# Patient Record
Sex: Male | Born: 1935
Health system: Southern US, Community
[De-identification: ages and names within clinical notes are randomized; demographics above are authoritative.]

## PROBLEM LIST (undated history)

## (undated) DIAGNOSIS — R011 Cardiac murmur, unspecified: Secondary | ICD-10-CM

## (undated) DIAGNOSIS — M47816 Spondylosis without myelopathy or radiculopathy, lumbar region: Secondary | ICD-10-CM

## (undated) DIAGNOSIS — R0600 Dyspnea, unspecified: Secondary | ICD-10-CM

## (undated) DIAGNOSIS — E785 Hyperlipidemia, unspecified: Secondary | ICD-10-CM

## (undated) DIAGNOSIS — R972 Elevated prostate specific antigen [PSA]: Secondary | ICD-10-CM

## (undated) DIAGNOSIS — Z22321 Carrier or suspected carrier of Methicillin susceptible Staphylococcus aureus: Secondary | ICD-10-CM

## (undated) DIAGNOSIS — C44300 Unspecified malignant neoplasm of skin of unspecified part of face: Secondary | ICD-10-CM

## (undated) DIAGNOSIS — E46 Unspecified protein-calorie malnutrition: Secondary | ICD-10-CM

## (undated) DIAGNOSIS — H409 Unspecified glaucoma: Secondary | ICD-10-CM

## (undated) DIAGNOSIS — Z9289 Personal history of other medical treatment: Secondary | ICD-10-CM

## (undated) DIAGNOSIS — I10 Essential (primary) hypertension: Secondary | ICD-10-CM

## (undated) DIAGNOSIS — M4856XA Collapsed vertebra, not elsewhere classified, lumbar region, initial encounter for fracture: Secondary | ICD-10-CM

## (undated) DIAGNOSIS — K219 Gastro-esophageal reflux disease without esophagitis: Secondary | ICD-10-CM

## (undated) DIAGNOSIS — I2510 Atherosclerotic heart disease of native coronary artery without angina pectoris: Secondary | ICD-10-CM

## (undated) DIAGNOSIS — D759 Disease of blood and blood-forming organs, unspecified: Secondary | ICD-10-CM

## (undated) DIAGNOSIS — I350 Nonrheumatic aortic (valve) stenosis: Secondary | ICD-10-CM

## (undated) DIAGNOSIS — C9140 Hairy cell leukemia not having achieved remission: Secondary | ICD-10-CM

## (undated) DIAGNOSIS — C189 Malignant neoplasm of colon, unspecified: Secondary | ICD-10-CM

## (undated) DIAGNOSIS — Z8719 Personal history of other diseases of the digestive system: Secondary | ICD-10-CM

## (undated) DIAGNOSIS — M810 Age-related osteoporosis without current pathological fracture: Secondary | ICD-10-CM

## (undated) DIAGNOSIS — K3580 Unspecified acute appendicitis: Secondary | ICD-10-CM

## (undated) DIAGNOSIS — F419 Anxiety disorder, unspecified: Secondary | ICD-10-CM

## (undated) DIAGNOSIS — D649 Anemia, unspecified: Secondary | ICD-10-CM

## (undated) DIAGNOSIS — J189 Pneumonia, unspecified organism: Secondary | ICD-10-CM

## (undated) DIAGNOSIS — M1990 Unspecified osteoarthritis, unspecified site: Secondary | ICD-10-CM

## (undated) HISTORY — DX: Unspecified acute appendicitis: K35.80

## (undated) HISTORY — DX: Hairy cell leukemia not having achieved remission: C91.40

## (undated) HISTORY — DX: Unspecified osteoarthritis, unspecified site: M19.90

## (undated) HISTORY — DX: Spondylosis without myelopathy or radiculopathy, lumbar region: M47.816

## (undated) HISTORY — DX: Hyperlipidemia, unspecified: E78.5

## (undated) HISTORY — DX: Age-related osteoporosis without current pathological fracture: M81.0

## (undated) HISTORY — DX: Unspecified protein-calorie malnutrition: E46

## (undated) HISTORY — DX: Anemia, unspecified: D64.9

## (undated) HISTORY — DX: Malignant neoplasm of colon, unspecified: C18.9

## (undated) HISTORY — DX: Elevated prostate specific antigen (PSA): R97.20

## (undated) HISTORY — DX: Essential (primary) hypertension: I10

## (undated) HISTORY — DX: Unspecified glaucoma: H40.9

## (undated) HISTORY — PX: FRACTURE SURGERY: SHX138

## (undated) HISTORY — PX: EYE SURGERY: SHX253

## (undated) HISTORY — DX: Nonrheumatic aortic (valve) stenosis: I35.0

## (undated) HISTORY — DX: Gastro-esophageal reflux disease without esophagitis: K21.9

## (undated) HISTORY — DX: Collapsed vertebra, not elsewhere classified, lumbar region, initial encounter for fracture: M48.56XA

## (undated) HISTORY — DX: Carrier or suspected carrier of methicillin susceptible Staphylococcus aureus: Z22.321

## (undated) HISTORY — DX: Atherosclerotic heart disease of native coronary artery without angina pectoris: I25.10

## (undated) HISTORY — PX: MOHS SURGERY: SUR867

---
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
## 1966-03-03 DIAGNOSIS — Z8711 Personal history of peptic ulcer disease: Secondary | ICD-10-CM

## 1966-03-03 DIAGNOSIS — Z8719 Personal history of other diseases of the digestive system: Secondary | ICD-10-CM

## 1966-03-03 HISTORY — DX: Personal history of other diseases of the digestive system: Z87.19

## 1966-03-03 HISTORY — DX: Personal history of peptic ulcer disease: Z87.11

## 1982-03-03 DIAGNOSIS — C189 Malignant neoplasm of colon, unspecified: Secondary | ICD-10-CM

## 1982-03-03 HISTORY — PX: COLON SURGERY: SHX602

## 1982-03-03 HISTORY — DX: Malignant neoplasm of colon, unspecified: C18.9

## 1983-03-04 HISTORY — PX: MOLE REMOVAL: SHX2046

## 1990-03-03 HISTORY — PX: SPLENECTOMY: SUR1306

## 1998-03-03 HISTORY — PX: ORIF ANKLE FRACTURE: SUR919

## 1998-07-19 ENCOUNTER — Encounter: Payer: Self-pay | Admitting: Specialist

## 1998-07-19 ENCOUNTER — Ambulatory Visit (HOSPITAL_COMMUNITY): Admission: RE | Admit: 1998-07-19 | Discharge: 1998-07-21 | Payer: Self-pay | Admitting: Specialist

## 1998-09-20 ENCOUNTER — Ambulatory Visit (HOSPITAL_COMMUNITY): Admission: RE | Admit: 1998-09-20 | Discharge: 1998-09-20 | Payer: Self-pay | Admitting: Specialist

## 2001-01-01 ENCOUNTER — Encounter: Payer: Self-pay | Admitting: Ophthalmology

## 2001-01-05 ENCOUNTER — Ambulatory Visit (HOSPITAL_COMMUNITY): Admission: RE | Admit: 2001-01-05 | Discharge: 2001-01-05 | Payer: Self-pay | Admitting: Ophthalmology

## 2004-03-25 ENCOUNTER — Ambulatory Visit: Payer: Self-pay | Admitting: Oncology

## 2004-04-03 HISTORY — PX: SHOULDER SURGERY: SHX246

## 2005-04-11 ENCOUNTER — Ambulatory Visit: Payer: Self-pay | Admitting: Oncology

## 2006-02-24 ENCOUNTER — Emergency Department (HOSPITAL_COMMUNITY): Admission: EM | Admit: 2006-02-24 | Discharge: 2006-02-24 | Payer: Self-pay | Admitting: Emergency Medicine

## 2006-04-08 ENCOUNTER — Ambulatory Visit: Payer: Self-pay | Admitting: Oncology

## 2006-10-07 ENCOUNTER — Encounter: Payer: Self-pay | Admitting: Internal Medicine

## 2006-10-07 ENCOUNTER — Encounter: Admission: RE | Admit: 2006-10-07 | Discharge: 2006-10-07 | Payer: Self-pay | Admitting: Internal Medicine

## 2006-10-09 ENCOUNTER — Encounter: Payer: Self-pay | Admitting: Internal Medicine

## 2006-10-09 ENCOUNTER — Inpatient Hospital Stay (HOSPITAL_COMMUNITY): Admission: AD | Admit: 2006-10-09 | Discharge: 2006-10-23 | Payer: Self-pay | Admitting: Internal Medicine

## 2006-10-13 ENCOUNTER — Ambulatory Visit: Payer: Self-pay | Admitting: Infectious Diseases

## 2006-10-13 ENCOUNTER — Encounter (HOSPITAL_BASED_OUTPATIENT_CLINIC_OR_DEPARTMENT_OTHER): Payer: Self-pay | Admitting: Internal Medicine

## 2006-10-19 ENCOUNTER — Encounter: Payer: Self-pay | Admitting: Internal Medicine

## 2006-11-16 ENCOUNTER — Encounter: Admission: RE | Admit: 2006-11-16 | Discharge: 2006-11-16 | Payer: Self-pay | Admitting: Neurosurgery

## 2006-11-19 ENCOUNTER — Encounter: Payer: Self-pay | Admitting: Internal Medicine

## 2006-11-19 ENCOUNTER — Telehealth: Payer: Self-pay | Admitting: Internal Medicine

## 2006-11-20 ENCOUNTER — Encounter: Payer: Self-pay | Admitting: Internal Medicine

## 2006-11-30 ENCOUNTER — Ambulatory Visit: Payer: Self-pay | Admitting: Internal Medicine

## 2006-11-30 DIAGNOSIS — A4901 Methicillin susceptible Staphylococcus aureus infection, unspecified site: Secondary | ICD-10-CM | POA: Insufficient documentation

## 2006-12-07 ENCOUNTER — Encounter: Admission: RE | Admit: 2006-12-07 | Discharge: 2006-12-07 | Payer: Self-pay | Admitting: Neurosurgery

## 2006-12-10 ENCOUNTER — Encounter: Payer: Self-pay | Admitting: Internal Medicine

## 2006-12-15 ENCOUNTER — Encounter: Payer: Self-pay | Admitting: Internal Medicine

## 2006-12-22 ENCOUNTER — Encounter: Payer: Self-pay | Admitting: Internal Medicine

## 2007-01-07 ENCOUNTER — Ambulatory Visit: Payer: Self-pay | Admitting: Internal Medicine

## 2007-01-07 LAB — CONVERTED CEMR LAB
HCT: 38.3 % — ABNORMAL LOW (ref 39.0–52.0)
Hemoglobin: 12.1 g/dL — ABNORMAL LOW (ref 13.0–17.0)
MCHC: 31.6 g/dL (ref 30.0–36.0)
MCV: 87.6 fL (ref 78.0–100.0)
Platelets: 225 10*3/uL (ref 150–400)
RBC: 4.37 M/uL (ref 4.22–5.81)
RDW: 16.4 % — ABNORMAL HIGH (ref 11.5–14.0)
Sed Rate: 12 mm/hr (ref 0–16)
WBC: 5.8 10*3/uL (ref 4.0–10.5)

## 2007-01-11 ENCOUNTER — Encounter: Payer: Self-pay | Admitting: Internal Medicine

## 2007-01-27 ENCOUNTER — Telehealth: Payer: Self-pay | Admitting: Internal Medicine

## 2007-02-02 ENCOUNTER — Encounter: Payer: Self-pay | Admitting: Internal Medicine

## 2007-02-03 ENCOUNTER — Telehealth: Payer: Self-pay | Admitting: Internal Medicine

## 2007-02-09 ENCOUNTER — Encounter: Admission: RE | Admit: 2007-02-09 | Discharge: 2007-02-09 | Payer: Self-pay | Admitting: Neurosurgery

## 2007-02-16 ENCOUNTER — Encounter: Payer: Self-pay | Admitting: Internal Medicine

## 2007-03-10 ENCOUNTER — Telehealth: Payer: Self-pay | Admitting: Internal Medicine

## 2007-03-25 ENCOUNTER — Encounter: Admission: RE | Admit: 2007-03-25 | Discharge: 2007-03-25 | Payer: Self-pay | Admitting: Neurosurgery

## 2007-03-29 ENCOUNTER — Encounter: Admission: RE | Admit: 2007-03-29 | Discharge: 2007-03-29 | Payer: Self-pay | Admitting: Neurosurgery

## 2007-04-01 ENCOUNTER — Encounter: Payer: Self-pay | Admitting: Internal Medicine

## 2007-06-08 ENCOUNTER — Encounter: Payer: Self-pay | Admitting: Internal Medicine

## 2007-07-21 ENCOUNTER — Encounter: Admission: RE | Admit: 2007-07-21 | Discharge: 2007-07-21 | Payer: Self-pay | Admitting: Neurosurgery

## 2007-07-27 ENCOUNTER — Encounter: Payer: Self-pay | Admitting: Internal Medicine

## 2007-10-02 HISTORY — PX: APPENDECTOMY: SHX54

## 2007-10-26 ENCOUNTER — Inpatient Hospital Stay (HOSPITAL_COMMUNITY): Admission: AD | Admit: 2007-10-26 | Discharge: 2007-11-02 | Payer: Self-pay | Admitting: Internal Medicine

## 2007-10-27 ENCOUNTER — Encounter (INDEPENDENT_AMBULATORY_CARE_PROVIDER_SITE_OTHER): Payer: Self-pay | Admitting: General Surgery

## 2008-02-01 HISTORY — PX: CATARACT EXTRACTION, BILATERAL: SHX1313

## 2009-03-30 ENCOUNTER — Encounter: Admission: RE | Admit: 2009-03-30 | Discharge: 2009-03-30 | Payer: Self-pay | Admitting: Neurosurgery

## 2009-04-10 ENCOUNTER — Encounter: Payer: Self-pay | Admitting: Internal Medicine

## 2010-04-02 NOTE — Assessment & Plan Note (Signed)
Summary: hsfu [facility] need chrt   Vital Signs:  Patient Profile:   77 Years Old Male Weight:      177.44 pounds Temp:     97.6 degrees F oral BP sitting:   115 / 59             Is Patient Diabetic? No  Does patient need assistance? Functional Status Self care Ambulation Normal     Visit Type:  Follow-up   History of Present Illness: Mr. Weed is in for his hospital follow-up visit.  My partners and I saw him when he was hospitalized in August with methicillin sensitive Staph aureus infection of his right wrist and lumbar spine.  He has been receiving IV Ancef via a left arm PICC at home.  He is doing much better.  He recalls that his pain was 150 on a one to 10 scale when first hospitalized.  He now has no pain and only minimal lower back aching.  He is only taking one to two Tylenol at bedtime for pain.  He has had no problems with his Ancef or PICC.      Risk Factors:  Tobacco use:  never    Physical Exam  General:     alert.  comfortable Extremities:     he has a stocking glove on his right hand and wrist.  His left arm PICC site looks normal. Neurologic:     gait normal.      Impression & Recommendations:  Problem # 1:  INFECTION, STAPHYLOCOCCUS AUREUS (ICD-041.11) He is much improved after 7+ weeks of IV antibiotic therapy.  His pain is nearly resolved and his sed rate has improved from 54 while hospitalized to 20.  I will stop Ancef and have the PICC removed after 8 full weeks of therapy and switched to oral Keflex for one more month. Orders: Est. Patient Level III (16109)    Patient Instructions: 1)  Please schedule a follow-up appointment in 4-6 weeks    ]

## 2010-04-02 NOTE — Consult Note (Signed)
Summary: [facility]:Dr. Theod[facility]:Dr. Newell Coral   Imported By: Florinda Marker 07/14/2007 15:22:51  _____________________________________________________________________  External Attachment:    Type:   Image     Comment:   External Document

## 2010-04-02 NOTE — Consult Note (Signed)
Summary: [facility]:Dr. Vivianne [facility]:Dr. Orvan Falconer   Imported By: Florinda Marker 08/30/2007 14:04:37  _____________________________________________________________________  External Attachment:    Type:   Image     Comment:   External Document

## 2010-04-02 NOTE — Consult Note (Signed)
Summary: [facility]  [facility]   Imported By: Randon Goldsmith 04/16/2007 15:13:26  _____________________________________________________________________  External Attachment:    Type:   Image     Comment:   External Document

## 2010-04-02 NOTE — Assessment & Plan Note (Signed)
Summary: FU OV/VS   Chief Complaint:  f/u ov.  History of Present Illness: [name] is in for his routine visit. He has now completed about 10 weeks of antibiotic therapy, including Keflex for the last month for his methicillin sensitive Staph aureus bacteremia, lumbar infection, and right wrist infection. he continues to have problems with a weak grip and stiffness in his right hand and wrist.  However, he feels like he is making progress with his physical therapy.  He has no further back pain other than his usual, occasional, low back discomfort. He has not had any trouble tolerating his Keflex.      Risk Factors: Tobacco use:  never    Vital Signs:  Patient Profile:   77 Years Old Male Height:     66 inches Weight:      177.6 pounds BMI:     28.77 Temp:     97.4 degrees F oral Pulse rate:   78 / minute BP sitting:   124 / 68  (right arm)  Pt. in pain?   yes    Location:   r hand    Intensity:   1    Type:       aching  Vitals Entered By: Tomasita Morrow RN (January 07, 2007 9:52 AM)              Is Patient Diabetic? No Nutritional Status BMI of 25 - 29 = overweight Nutritional Status Detail nl  Have you ever been in a relationship where you felt threatened, hurt or afraid?No  Domestic Violence Intervention none  Does patient need assistance? Functional Status Self care Ambulation Normal     Physical Exam  General:     alert, smiling and comfortable.   Heart:     normal rate, regular rhythm, and no murmur.   Extremities:     some persistent swelling around the right wrist and the dorsum of his right hand.  Otherwise, there is no evidence of active infection. Neurologic:     gait normal.      Impression & Recommendations:  Problem # 1:  INFECTION, STAPHYLOCOCCUS AUREUS (ICD-041.11)  There is a very good chance that [name] infection is now cured.  However, I've told him that the only real test of cure is to eventually stop antibiotics and make sure that he has no recurrence.  Given the severity of his recent infection he would like to continue the Keflex for a little bit longer.  I think that is quite reasonable since he is tolerating it.  I will repeat his CBC and sed rate today. Orders: Est. Patient Level III (16109) T-CBC No Diff ([id number]) T-Sed Rate (Automated) ([id number])   Medications Added to Medication List This Visit: 1)  Keflex 500 Mg Caps (Cephalexin) .... Take 1 capsule by mouth three times a day   Patient Instructions: 1)  Please schedule a follow-up appointment in 1 month.    ]

## 2010-04-02 NOTE — Letter (Signed)
Summary: Discharge Summary  Discharge Summary   Imported By: Randon Goldsmith 12/02/2006 08:20:10  _____________________________________________________________________  External Attachment:    Type:   Image     Comment:   External Document

## 2010-04-02 NOTE — Progress Notes (Signed)
Summary: Labs faxed to Dr. Kennieth Rad office  Phone Note Call from Patient Call back at Home Phone [phone]   Caller: Spouse Reason for Call: Talk to Nurse, Lab or Test Results Action Taken: Phone Call Completed Summary of Call: Request to have most recent labs faxed to Dr. Kennieth Rad office.  Results faxed.  Wife called to let her know these had been faxed. Initial call taken by: Jennet Maduro RN,  January 27, 2007 5:34 PM

## 2010-04-02 NOTE — Consult Note (Signed)
Summary: [facility]  [facility]   Imported By: Florinda Marker 05/03/2009 14:12:38  _____________________________________________________________________  External Attachment:    Type:   Image     Comment:   External Document

## 2010-04-02 NOTE — Progress Notes (Signed)
  Phone Note Outgoing Call   Call placed by: Cliffton Asters MD,  February 03, 2007 2:58 PM Summary of Call: [name] make his appointment on 12/23 to discuss stopping Keflex. I suggested that he call me after his MRI on 12/16 and we can discuss options over the phone.

## 2010-04-02 NOTE — Miscellaneous (Signed)
Summary: [facility]   Imported By: Florinda Marker 01/14/2007 16:14:20  _____________________________________________________________________  External Attachment:    Type:   Image     Comment:   External Document

## 2010-04-02 NOTE — Consult Note (Signed)
Summary: [facility]  [facility]   Imported By: Randon Goldsmith 04/26/2007 13:19:04  _____________________________________________________________________  External Attachment:    Type:   Image     Comment:   External Document

## 2010-04-02 NOTE — Letter (Signed)
Summary: [facility]  [facility]   Imported By: Randon Goldsmith 02/24/2007 10:43:59  _____________________________________________________________________  External Attachment:    Type:   Image     Comment:   External Document

## 2010-04-02 NOTE — Consult Note (Signed)
Summary: [facility]  [facility]   Imported By: Randon Goldsmith 11/25/2006 08:45:00  _____________________________________________________________________  External Attachment:    Type:   Image     Comment:   External Document

## 2010-04-02 NOTE — Miscellaneous (Signed)
Summary: [facility]  [facility]   Imported By: Florinda Marker 12/29/2006 16:26:28  _____________________________________________________________________  External Attachment:    Type:   Image     Comment:   External Document

## 2010-04-02 NOTE — Miscellaneous (Signed)
Summary: [facility]  [facility]   Imported By: Randon Goldsmith 03/26/2007 11:42:40  _____________________________________________________________________  External Attachment:    Type:   Image     Comment:   External Document

## 2010-04-02 NOTE — Progress Notes (Signed)
Summary: pt. seen by Dr. Newell Coral, MD please call  Phone Note From Other Clinic   Caller: Dr. Newell Coral Call For: Dr. Cliffton Asters Details for Reason: Return his phone call Summary of Call: Pt. doing well on current IV antibiotics.  Dr. Ezzard Standing believes the pt. should stay on this treatment until the middle of October.  Pt's wrist septic arthritis and lumbar osteomylitis is improving.  Would appreciate a call from Dr. Orvan Falconer the week of September 22nd.  Office # [phone]  Initial call taken by: Jennet Maduro RN,  November 19, 2006 4:18 PM  Follow-up for Phone Call        [name] is feeling "like a million bucks". He is off pain meds. I will continue IV Ancef at least until his visit on 9/29. Follow-up by: Cliffton Asters MD,  November 23, 2006 5:00 PM

## 2010-04-02 NOTE — Progress Notes (Signed)
  Phone Note Outgoing Call   Call placed by: Cliffton Asters MD,  March 10, 2007 4:57 PM Reason for Call: Discuss lab or test results Details for Reason: review MRI results Summary of Call: I reviewed the MRI done on 12/08. There was a stable appearance of the lumbar spine without any definite signs of active infection. He finished his keflex in mid-December and is feeling better. He will stay off antibiotics and call me a fter his next MRI later this month.

## 2010-04-16 DIAGNOSIS — H43819 Vitreous degeneration, unspecified eye: Secondary | ICD-10-CM | POA: Insufficient documentation

## 2010-04-16 DIAGNOSIS — H35379 Puckering of macula, unspecified eye: Secondary | ICD-10-CM | POA: Insufficient documentation

## 2010-04-16 DIAGNOSIS — H353290 Exudative age-related macular degeneration, unspecified eye, stage unspecified: Secondary | ICD-10-CM | POA: Insufficient documentation

## 2010-07-16 NOTE — Discharge Summary (Signed)
NAME:  [name], [name]                 ACCOUNT NO.:  [id number]   MEDICAL RECORD NO.:  [id number]          PATIENT TYPE:  INP   LOCATION:  6741                         FACILITY:  [facility]   PHYSICIAN:  Barry Dienes. Eloise Harman, M.D.DATE OF BIRTH:  1923/06/25   DATE OF ADMISSION:  10/09/2006  DATE OF DISCHARGE:  10/23/2006                         DISCHARGE SUMMARY - REFERRING   CHIEF COMPLAINT:  Persistent pain and swelling of the right hand  associated with fever and back pain.   HISTORY OF PRESENT ILLNESS:  The patient is an 77 year old white male  who approximately 8 days prior to admission began to develop pain and  slight redness and edema of the right hand. He was seen at an outside  hospital on October 02, 2006 and felt to possibly have gout, so he was  started on Indocin 25 mg t.i.d. and Percocet as needed. He was seen in  our office on October 05, 2006 and it was noted that his right wrist and  hand were erythematous and tender without fluid accumulation, so  Colchicine 0.6 mg twice daily was started and a serum uric acid level  was obtained. On October 07, 2006, he represented to our office due to  persistent pain and edema in the right hand. At that time, he denied  fever or chills. His previous labs were significant for uric acid level  of 4.8. He had increased erythema of the right hand and right wrist with  worsening pain with dorsiflexion but no fluctuance, so further labs were  obtained including a CBC and erythrocyte sedimentation rate as well as x-  ray's of the hand, after which prednisone was to be started. Despite  these measures, his right hand pain and edema worsened over the next 2  days. He developed fever and chills with a temperature greater than 101  and very mild shortness of breath without productive cough, so he was  again seen in our office. Of note, he had been boating 1 day prior to  the onset of his right hand pain, but he was not fishing and had no skin  tears or  injuries while boating. At the time of hospital admission, he  also complained of his usual chronic low back pain without leg  radiation.   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 He also has a history of  sigmoid colon adenocarcinoma that was [facility] B-1 stage. He has a history  of situational depression, erectile dysfunction, dyspnea on exertion  with a May 2007 Cardiolite exercise test showing normal left ventricular  systolic function and no evidence of ischemia. He has a vague history of  coronary artery disease and a left lung nodule that has been evaluated  with bronchoscopy and serial chest CT scans at [facility]. He has a  history of elevated PSA levels with normal free PSA levels. Lastly, he  has a history of hypertension and bilateral hearing loss and  hyperlipidemia, for which he has declined  medication.   ADMISSION MEDICATIONS:  Preserve-vision twice daily, Centrum Silver once  daily, Zoloft 100 mg daily, aspirin 81 mg daily, Toprol XL 50 mg take  1/2 tab daily, Prilosec 20 mg daily, prednisone 20 mg daily for the past  2 days, Lisinopril 10 mg daily.   ALLERGIES:  SULFA DRUGS, some ANESTHETICS but not Lidocaine.   PHYSICAL EXAMINATION:  VITAL SIGNS:  Blood pressure 130/84, pulse 88,  respiratory rate 24, temperature 99.1. Pulse oxygen saturation 94% on  room air.  GENERAL:  He is a fatigued appearing white male with a flush face, who  had very mild tachypnea.  HEENT:  Examination significant for very dry skin and mild erythema in  the left external auditory canal.  NECK:  Supple. Without jugular venous distention. No carotid bruit.  CHEST:  Clear to auscultation.  HEART:  Regular rate and rhythm. S1 and S2 were present without murmur,  gallop, or rub.  ABDOMEN:  Normal bowel sounds and no hepatosplenomegaly or tenderness.   EXTREMITIES:  Without edema and the pedal pulses were normal. There is  diffuse erythema, edema, and fluctuance of the dorsum of the right hand  and wrist. This area is very tender to palpation. Dorsiflexion of the  right wrist is very painful.  NEUROLOGIC:  Alert and well oriented. He has moderate bilateral hearing  loss. He was able to change from a sitting position to a standing  position with only standby assistance and able to walk a few feet  independently.   LABORATORY DATA:  October 05, 2006, BUN 29, creatinine 0.9, uric acid 4.8.   October 07, 2006, white blood cell count 16.7 with 86% granulocytes.  Hemoglobin 12.6 and hematocrit 37.2. Platelets 236,000. Erythrocyte  sedimentation rate 100.   October 07, 2006, right wrist x-ray showed osteoarthritis as well as right  hand x-ray's. There were no signs of osteomyelitis.   Initial hospital labs, white blood cell count 19.0 with hemoglobin 11  and hematocrit 32. Platelets 235,000. Erythrocyte sedimentation rate 58,  serum sodium 135, potassium 4.1, chloride 105, carbon dioxide 23, BUN  25, creatinine 0.98, glucose 203.   HOSPITAL COURSE:  The patient was initially started on broad spectrum  antibiotics including Doxycycline, Rocephin, and Vancomycin, as well as  IV fluids. He was seen on the day of admission by a hand surgeon who  felt that he had septic arthritis and brought him to the operating room  for right wrist arthrotomy and drainage of infectious arthritis. He  noted that there was gross purulence within the right wrist and mid  carpal joint. 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 Cultures subsequently grew  out methicillin sensitive Staphylococcus aureus. He had re-evaluation  and incision and drainage on October 11, 2006 where he again noted gross  purulence within the wrist joint, more so within the scaphotrapezial  joint and SET joints. The patient did  well with the debridement and  irrigation. He had a third arthrotomy and drainage and debridement on  October 15, 2006, with a small amount of purulence in the mid carpal  joint and subcutaneous tissues was noted. Otherwise, the wound looked  much better. On October 14, 2006, he developed moderate low back pain and  had a MRI scan of the lumbar spine and pelvis done, which showed  extensive epidural fluid collection beginning at the L2-3 level and  extending down to the S2 level. This had a multi-locular appearance,  which  was displacing the dura and multiple nerve roots. The loculation  in the right sacral canal compressed the thecal sac and the S1 nerve  root. There was some edema and enhancement in the area of the right L5-  S1 facet joint, suggesting septic arthritis. There was also extensive  disk degeneration of the lumbar spine and extensive disk space narrowing  and disk edema at L3-4, L4-5, and L5-S1. Fatty endplate changes were  quite pronounced at those levels as well. There was no vertebral body  collapse seen. At the L1-2 level, there was a moderately large right  side disk protrusion, compression of the right side of the thecal sac  and compression of the right lateral recess. The pelvis MRI showed no  discrete findings for osteomyelitis. There was myositis and myofascitis  involving the surrounding hip and pelvic musculature, particularly on  the right, with no discrete drainable soft tissue abscess.   The patient was seen subsequently by a neurosurgeon and Infectious  Disease was consulted. It was felt that a trial of antibiotics with  close followup was warranted, rather than proceeding to extensive spine  surgery. His antibiotics were switched to Ancef in high doses and  Rifampin. Gradually, the patient's back pain and right wrist and hand  pain improved.   He had a followup lumbar spine MRI scan with contrast on October 21, 2006  that showed a multi-locular epidural  abscess from L3-4 through the  sacrum, similar to the previous study. The wall of the abscess was  slightly thicker but there was no significant progression in size of the  abscess. The patient had several other procedures including a  transthoracic echocardiogram that showed normal left ventricular size  and systolic function with mild aortic valve sclerosis without  significant stenosis. There was mild mitral valvular regurgitation and  mild to moderate tricuspid regurgitation. No vegetations were seen. The  patient also had a basilic vein PICC line placed at the bedside with no  complications.   PROCEDURES:  1. Incision and drainage of right wrist and hand abscess on October 10, 2006, October 11, 2006, and October 15, 2006.  2. MRI scan of the lumbar spine and pelvis on October 14, 2006.  3. MRI scan of the lumbar spine on October 21, 2006.  4. Transthoracic echocardiogram.  5. Left upper extremity PICC line placement.   COMPLICATIONS:  None.   CONDITION ON DISCHARGE:  His strength is improving. His appetite is  good. He has several soft stools per day but no diarrhea. He has mild  low back pain and has been taking 1 to 2 narcotic tablets daily. He  denies shortness of breath, chest pain, abdominal pain, or worsening  weakness in the lower extremities. Most recent labs include white blood  cell count 7.6, hemoglobin 9.7, hematocrit 28.7, platelets 506,000.  Serum sodium 134, potassium 4.2, chloride 99, carbon dioxide 29, BUN 21,  creatinine 0.79, glucose 110, total protein 5.6, albumin 2.2, ESR 54,  CRP 86.5.   PHYSICAL EXAMINATION:  VITAL SIGNS:  Blood pressure 132/68, pulse 63,  respiratory rate 20, temperature 98.1. Pulse oxygen saturation 96% on 2  liters per minute nasal cannula oxygen.  GENERAL:  A well developed, well nourished, white male in no acute  distress.  HEENT:  Examination was significant for dried skin with irritation in  the left external auditory canal.   NECK:  Supple without jugular venous distention or carotid bruit.  CHEST:  Clear to  auscultation.  HEART:  Regular rate and rhythm. Without significant murmur or gallop.  ABDOMEN:  Normal bowel sounds and no hepatosplenomegaly or tenderness.  EXTREMITIES:  Bilateral 2+ pitting edema that was slightly greater on  the right than the left side.  NEUROLOGIC:  Alert and well oriented with normal affect. He has  bilateral moderately impaired hearing. His motor examination was  significant for 4 out of 5 left foot dorsiflexion and otherwise, was 5/5  throughout. Light touch was grossly intact. He was able to change from a  sitting position to a standing position and walk a short distance in the  room independently. His right hand is wrapped in compression dressing.   DISCHARGE DIAGNOSES:  1. Lumbar spine epidural abscess.  2. Right wrist abscess.  3. Immune compromise due to remote splenectomy.  4. Anemia.  5. Hyponatremia.  6. Malnutrition.  7. Hypertension.  8. Depression.  9. Chronic low back pain.  10.Gastroesophageal reflux.  11.1992 hairy cell leukemia with splenectomy.  12.Early stage colon cancer.  13.Coronary artery disease.  14.Left lung nodule being followed at [facility].  15.Elevated PSA levels.  16.Bilateral hearing loss.  17.Otitis externa.  18.Hyperlipidemia.   DISCHARGE MEDICATIONS:  1. Ancef 1 gram IV at 7:00 a.m., 2:00 p.m., and 10:00 p.m. for the      next 6 weeks.  2. Ensure pudding twice daily.  3. Lisinopril 10 mg daily.  4. Zoloft 100 mg daily.  5. Naprosyn 500 mg once daily prn mild pain.  6. Vicodin 5/500 1 to 2 tabs p.o. t.i.d. p.r.n. moderate pain, #180.  7. Omeprazole 20 mg daily.  8. MiraLax 17 grams daily p.r.n. constipation.  9. Senna S 2 tabs daily p.r.n. constipation.  10.Robaxin 500 mg twice daily p.r.n. muscle spasms.  11.Aspirin 81 mg daily.  12.Toprol XL 25 mg daily.  13.Multivitamin such as Centrum Silver once daily.  14.Saline lock  IV PICC line after each Ancef dose.  15.Ciprodex otic 4 drops into left ear canal twice daily for 7 days.   DISPOSITION/FOLLOWUP:  The patient will be discharged to home where he  will receive assistance from a visiting home nurse, physical therapist,  and occupational therapist. He should have a followup appointment with  Dr. Cliffton Asters in the Infectious Disease Clinic at [facility] in approximately 4 weeks following discharge. He should have a  followup appointment with Dr. Shirlean Kelly in approximately 10 days  following discharge and can call [phone] to schedule that appointment.  He also should have a followup appointment with Dr. Jarome Matin at  [facility] next week. In addition, he should have a  followup appointment with Dr. Bradly Bienenstock at [facility]  next week. He was advised that if he has significant worsening low back  pain, or worsening weakness or numbness in his legs or feet, that he  should contact Dr. Eloise Harman as soon as possible.           ______________________________  Barry Dienes. Eloise Harman, M.D.     DGP/MEDQ  D:  10/22/2006  T:  10/22/2006  Job:  [id number]   cc:   Hewitt Shorts, M.D.  Madelynn Done, MD  Cliffton Asters, M.D.  Fransisco Hertz, M.D.  Lenord Fellers

## 2010-07-16 NOTE — Consult Note (Signed)
NAMEEVON, LOPEZPEREZ                 ACCOUNT NO.:  [id number]   MEDICAL RECORD NO.:  [id number]          PATIENT TYPE:  INP   LOCATION:  6741                         FACILITY:  [facility]   PHYSICIAN:  Hewitt Shorts, M.D.DATE OF BIRTH:  25-Jan-1924   DATE OF CONSULTATION:  10/14/2006  DATE OF DISCHARGE:                                 CONSULTATION   HISTORY OF PRESENT ILLNESS:  The patient is an 77 year old white male  who was admitted by Dr. Brunilda Payor for a right hand infection, which  presented with pain, swelling, fever.  He had initially been treated at  another facility for suspected gout.  Admission laboratory data showed  white blood cell count of 19.0 with elevated sed rate of 58.  Orthopaedic consultation was obtained from Dr. Bradly Bienenstock, who  diagnosed a right wrist infectious arthritis, and performed right wrist  orthotomy and drainage of infectious arthritis on October 10, 2006.  Cultures grew methicillin-sensitive Staphylococcus aureus and the  patient was treated with Ancef.  He has been seen in infectious disease  consultation by Dr. Darlina Sicilian.  He has had long-standing complaints of  intermittent back discomfort, and has been having some back discomfort  and pain for the past month, although it is in fact actually not as bad  now as it had been even a month ago.  He describes some discomfort into  the right buttock, but no radicular pain radiating into the lower  extremities, nor does he describe any focal motor or sensory dysfunction  such as weakness, numbness, or paresthesia.   However, because of his complaints of back pain, Dr. Maurice March recommended,  and Dr. Jarold Motto ordered an MRI of the lumbar spine.  It was done today  with and without gadolinium.  It revealed an epidural abscess extending  from L2 to S1, and neurosurgical consultation was requested for further  recommendations regarding treatment of this epidural abscess.   The patient has been treated with  Ancef under the direction of Dr. Maurice March,  and with that his fever at the time of admission has defervesced, and he  now has had no fever for the past 3 days.  Further, his white blood cell  count has significantly improved, and it is now 13, down from a high of  19.   The patient was seen sitting up in a chair.  His right upper extremity  is in a large splint and hung in traction.   PAST MEDICAL HISTORY:  Notable for history of hairy cell leukemia in  1992, treated with splenectomy.  Also, history of colon cancer.  He also  has history of depression, erectile dysfunction, dyspnea on exertion, he  has history of coronary artery disease and a left lung nodule.  He has  had a history of elevated prostatic specific antigen, hypertension, and  hearing loss, as well as history of hyperlipidemia.   MEDICATIONS AT TIME OF ADMISSION:  Include Zoloft 100 mg daily, aspirin  81 mg daily, Toprol XL 25 mg daily, Prilosec 20 mg daily, prednisone,  lisinopril 10 mg daily.   ALLERGIES:  He reports an allergy to sulfa drugs.   FAMILY HISTORY:  Father died at age 52, he had stroke and congestive  heart failure.  Mother has passed on.   SOCIAL HISTORY:  The patient is married, apparently it is a second  marriage.  The patient quit smoking 10 years ago.  He apparently does  not drink alcohol.   REVIEW OF SYSTEMS:  Notable for difficulty describing his past medical  history.  Review of systems is otherwise unremarkable.  He did have a  left ankle open reduction internal fixation by Dr. Debria Garret in  2000, facial repair in [facility] in 1990, and  cataract removal a number of years ago.   PHYSICAL EXAMINATION:  GENERAL:  The patient is an elderly male in no  acute distress.  VITAL SIGNS:  He is afebrile.  Temperature 99.4, pulse 66, blood  pressure 130/69.  Respiratory rate 16.  LUNGS:  Clear to auscultation.  He has symmetric respiratory excursion.  HEART:  Regular rate and  rhythm.  Normal S1 and S2.  There is no murmur.  MUSCULOSKELETAL:  Examination shows mild tenderness to palpation in the  lumbar region diffusely.  Mobility is limited in flexion at about 45  degrees due to pain.  He is able to extend to about 10 degrees without  any pain.  NEUROLOGIC:  Examination shows on motor examination, 5/5 strength to the  left upper extremity including deltoid, biceps, triceps, pinchers and  grip.  Because he is in a splint, the patient was unable and unwilling  to go through testing of the right upper extremity.  In the lower  extremities, the iliopsoas, quadriceps, dorsiflexors and plantar flexors  are 5 bilaterally.  The extensor hallucis longus is 4 bilaterally,  although the patient notes that this is old weakness which he has had.   DIAGNOSTIC STUDIES:  MRI of the lumbar spine with and without gadolinium  was reviewed with Dr. [name], the radiologist on duty, as well as  with Dr. Darlina Sicilian, the infectious disease consultant.  The study shows  epidural abscess extending from the L2 level to the S1 level with  various areas that appear somewhat loculated.  He has extensive  degenerative disk disease and spondylosis as well through the lumbar  spine.   IMPRESSION:  A patient with methicillin-sensitive Staphylococcus aureus  infection of the right wrist, status post two drainages by Dr. Melvyn Novas.  He has been found to have an epidural abscess with some back pain,  although he has had a history of back discomfort and back pain for many  years.  He does not describe significant radicular symptoms, and motor  and sensory function are intact, as are bowel and bladder function.  The  patient has been started on Ancef, and with that, he has had  defervescence of his fever, with now normal temperatures for several  days, and significant improvement in his white blood cell count from the  initial presenting leukocytosis.   RECOMMENDATIONS:  I discussed my  assessment and impression at length  with Dr. Maurice March, and we had a lengthy discussion after reviewing his MRI  together.  We discussed options for treatment, including continued  medical treatment with close followup versus surgical debridement with  continued antibiotic therapy.   Surgery would be extensive, requiring an L2 to S1 laminectomy and  debridement of the abscess, and in this patient who is clinically  improving, I favored continued medical treatment with antibiotic  therapy  with close followup including repeat MRI of the lumbar spine, with and  without gadolinium in one week or sooner if he has increased  difficulties.  If he were to become febrile again, if his white blood  cell count were to rise, if he developed neurologic dysfunction and  deficit, or if he developed increasing back pain, then surgical  decompression and debridement may be necessary.   Dr. Bonnetta Barry initial recommendation for the septic arthritis was for two  weeks of antibiotic therapy.  We both feel that the antibiotic therapy  will need to last at least two months, and may need to last more,  depending on his response.  Dr. Maurice March has recommended adding Rifampin,  which has been ordered.  We will continue to follow with Dr. Jarold Motto,  his medical physician, as well as with Dr. Maurice March.  I have spoken with the  patient, his wife and friends at  length regarding his condition.  I have drawn pictures of the lumbar  spine to help explain the nature of his condition, we have discussed the  options for treatment and our recommendations for treatment, and their  questions regarding these were answered.  They understand that I will be  in town until August 18, then will be out of town for one week.      Hewitt Shorts, M.D.  Electronically Signed     RWN/MEDQ  D:  10/14/2006  T:  10/15/2006  Job:  [id number]   cc:   Fransisco Hertz, M.D.

## 2010-07-16 NOTE — Op Note (Signed)
[name], [name]                 ACCOUNT NO.:  [id number]   MEDICAL RECORD NO.:  [id number]          PATIENT TYPE:  INP   LOCATION:  6702                         FACILITY:  [facility]   PHYSICIAN:  Madelynn Done, MD  DATE OF BIRTH:  [birth year]   DATE OF PROCEDURE:  10/10/2006  DATE OF DISCHARGE:                               OPERATIVE REPORT   PREOPERATIVE DIAGNOSIS:  Right wrist infectious arthritis.   POSTOPERATIVE DIAGNOSIS:  Right wrist infectious arthritis.   ATTENDING SURGEON:  Dr. Gilman Schmidt who was scrubbed and present for the  entire procedure.   ASSISTANT SURGEON:  None.   ANESTHESIA:  General via endotracheal tube.   PROCEDURE:  Right wrist arthrotomy and drainage of infectious arthritis.   DRAINS:  Two Penrose drains.   INTRAOPERATIVE FINDINGS:  There was gross purulence within the right  wrist and midcarpal joint.  Intraoperative cultures were taken.  The  patient did have advanced radiocarpal arthrosis involving the  radioscapholunate articulation.   SURGICAL INDICATIONS:  [name] is an 77 year old right-hand-dominant  gentleman had a one-week history of right wrist pain.  The patient had  been treated for inflammatory arthropathy of the wrist with gout  medicine as well as antibiotics, did not respond to these measures.  I  saw and evaluated the patient on 10/09/06 and performed an aspiration of  the wrist and obtained gross purulence and cell count from the  aspiration greater than 150,000 white cells.  Signed informed consent  was obtained to proceed with the above procedure.  Risks, benefits and  alternatives discussed in detail with the patient and the patient's  wife.  Risks include but not limited to bleeding, infection, need for  further surgery, damage to nearby nerves, tendons, arteries, loss of  motion of the wrist and worsening infection.   DESCRIPTION OF PROCEDURE:  The patient was properly identified in the  preoperative holding area mark and  a permanent mark was made on the  right wrist indicate correct operative site.  The patient brought back  to the operating room, placed supine on the anesthesia room table where  general anesthesia was administered.  The patient tolerated this well.  Well-padded tourniquet was then placed on the right brachium and sealed  with a 1000 drape.  The right upper extremity was then prepped with  Betadine and sterilely draped.  A time-out was called, the correct site  was identified and the surgical procedure was then begun.  The limb was  then elevated and tourniquet insufflated to 250 mmHg.  A longitudinal  incision was then made directly centered over Lister's tubercle in line  with the third finger metacarpal.  Dissection was carried down through  the skin, subcutaneous tissues.  Hemostasis was obtained with  electrocautery.  Subcutaneous flaps were raised.  The third dorsal  compartment was then isolated and the EPL was then transferred out of  the third dorsal compartment.  The fourth dorsal compartment was then  elevated and then a longitudinal capsulotomy was then made in the wrist  joint.  Gross purulence was encountered.  Intraoperative cultures were  then taken.  The wrist was then thoroughly irrigated with saline  solution over 6 liters run through the wrist.  After the arthrotomy and  drainage of the infected wrist the capsulotomy incision was tacked back  with one 4-0 Monocryl suture.  The retinaculum over the fourth dorsal  compartment was then reapproximated with two 4-0 Monocryl sutures.  Two  Penrose drains were then placed one deep and one superficial within the  length of the incision.  The skin was then closed with 4-0 nylon suture  simple sutures.  The tourniquet was deflated prior to closure of the  skin.  There  was good perfusion of the digits.  Adaptic dressing was  then applied over the wound.  A sterile compressive dressing was then  applied.  The patient was then  placed in well-padded volar plaster  splint.  The patient was extubated and taken to recovery room in good  condition.  Tolerated procedure well.   POSTOPERATIVE PLAN:  The patient will be admitted for IV antibiotics.  I  will continue to follow his hospital course.  If he improves, he may not  require repeat I&D, but if his condition does not improve, he may  require another repeat incision and drainage.      Madelynn Done, MD  Electronically Signed     FWO/MEDQ  D:  10/10/2006  T:  10/11/2006  Job:  [id number]

## 2010-07-16 NOTE — Discharge Summary (Signed)
NAME:  [name], [name]                 ACCOUNT NO.:  [id number]   MEDICAL RECORD NO.:  [id number]          PATIENT TYPE:  INP   LOCATION:  5526                         FACILITY:  [facility]   PHYSICIAN:  Barry Dienes. Eloise Harman, M.D.DATE OF BIRTH:  28-Dec-1923   DATE OF ADMISSION:  10/26/2007  DATE OF DISCHARGE:  11/02/2007                               DISCHARGE SUMMARY   PERTINENT FINDINGS:  The patient is an 77 year old Caucasian man with  multiple medical problems who had been seen in our office on the day  prior to admission with vague epigastric pain that started early in the  morning and was associated with nausea and vomiting x1.  He denied  constipation, dysuria, or diarrhea.  He had a temperature of 100.3  degrees Fahrenheit and otherwise normal vital signs with mild right  lower quadrant tenderness without rebound.  A urinalysis at that time  was unremarkable and a CBC was notable for a white cell count of 13.3.  He was given Rocephin 2 g IM with a prescription to start Augmentin on  the next day.  I advised to call if his symptoms worsen.  Initially he  felt better, but he awoke with a temperature of 103.8 degrees Fahrenheit  with chills and moderate right lower quadrant pain.  That was worse with  moving his right lower extremity.  He continued to have mild nausea but  had not had vomiting and denied cough, shortness of breath, chest pain,  or frequency.   PAST MEDICAL HISTORY:  Most significant for August 2008, hospital  admission for methicillin-sensitive Staphylococcus aureus infection of  the right hand and wrist that required incision and debridement, and at  the same time an epidural abscess with presumed methicillin-sensitive  staph aureus.  After prolonged treatment with IV Keflex, his symptoms  resolved and his MRI of the lumbar spine showed no evidence of continued  abscess.  He also has a history of severe lumbar spine degenerative disk  disease, osteoarthritis, compression  fractures of the lumbar spine,  immune compromise due to a remote splenectomy for hairy cell leukemia,  anemia, hyponatremia, malnutrition, hypertension, depression,  gastroesophageal reflux, 1992 diagnosis of hairy cell leukemia, early  stage colon cancer, coronary artery disease, a left lung nodule followed  by [facility], and an elevated PSA level in the  12 range, also followed by [facility], bilateral sensorineural  hearing loss, and hyperlipidemia.   MEDICATIONS PRIOR TO ADMISSION:  1. Centrum Silver 600 one tablets daily.  2. PreserVision 1 tablet daily.  3. Zoloft 50 mg daily.  4. Xalatan 0.005% 1 drop in each eye nightly.  5. Aspirin 81 mg daily.  6. Chlorthalidone 25 mg daily.  7. Caltrate 600 mg daily.  8. Fortical nasal spray take 200 units intranasally daily.   INITIAL PHYSICAL EXAMINATION:  VITAL SIGNS:  Blood pressure 102/59,  pulse 76, respirations 20, temperature 99.5, and pulse oxygen saturation  94% on room air.  GENERAL:  He is an elderly white man who was somewhat  flushed but in no apparent distress while lying fully supine  in bed.  HEENT:  Within normal limits.  NECK:  Without jugular venous distention or carotid bruit.  CHEST:  Clear to auscultation.  HEART:  A regular rate and rhythm with a systolic ejection murmur of  grade 2/6 at the left sternal border.  ABDOMEN:  Markedly decreased bowel sounds with no hepatomegaly and mild  right lower quadrant tenderness without rebound.  EXTREMITIES:  Bilateral trace ankle edema.  NEUROLOGIC:  Significant for mildly decreased hearing bilaterally.   INITIAL LABORATORY STUDIES:  White blood cell count 12.9, hemoglobin 13,  hematocrit 41, and platelets 176.  Serum potassium 2.7.   HOSPITAL COURSE:  The patient was admitted to a medical bed without  telemetry.  On the evening of admission, he had a CT scan of the abdomen  and pelvis.  That was significant for a 3.9 cm x 3.0 cm soft  tissue  density with surrounding mesenteric stranding and no adjacent  lymphadenopathy.  There were borderline dilated loops of small bowel in  the left upper quadrant without a focal transition point.  Also noted  was an enlarged heterogeneous prostate gland.  The radiologist felt that  was indirect evidence of probable sigmoid partial colectomy with  inflammatory mass such as appendicitis or diverticulitis.  After his  intravascular fluid status was normalized, his condition improved  somewhat.  He was taken to the operating room on October 27, 2007, for an  open appendectomy that revealed a retrocecal appendix.  That was  necrotic with perforation that was treated in the usual fashion without  immediate complications.  He was continued on broad-spectrum antibiotics  (Zosyn), and his condition gradually improved.  He did have significant  hypokalemia that eventually resolved with intravenous and p.o.  replacement and discontinuation of chlorthalidone.  He developed some  scrotal and penis edema and had an ultrasound exam done that showed  penis edema with epidermal cyst versus spermatoceles.   PROCEDURES:  CT scan of the abdomen and pelvis, open appendectomy, and  ultrasound of the scrotum.   COMPLICATIONS:  None.   CONDITION ON DISCHARGE:  His appetite has been improving, and he has not  had diarrhea.  He has been somewhat fatigued but has been able to  ambulate slowly in his room with standby assistance.   MOST RECENT PHYSICAL EXAMINATION:  VITAL SIGNS:  Blood pressure 110/61,  pulse 59, respirations 20, temperature 98.8, and pulse oxygen saturation  93% on room air.  Serial blood cultures were negative.  GENERAL:  He is an elderly white male who is in no apparent distress,  was sitting partially upright in a chair.  CHEST:  Clear to auscultation.  HEART:  Regular rate and rhythm with a systolic ejection murmur of grade  2/6 at the left sternal border.  ABDOMEN:  Normal bowel  sounds with no tenderness.  The right lower  quadrant surgical incision is well-approximated with staples and has  minimal erythema around the staple insertion sites.  He has mild scrotal  edema and bilateral 1+ pitting edema of the legs.   MOST RECENT LABORATORY STUDIES:  Serum sodium 140, potassium 4.0,  chloride 109, carbon dioxide 24, BUN 25, creatinine 1.08, glucose 101,  total protein 5.0, and albumin 2.0.  White blood cell count 7.8,  hemoglobin 10.9, hematocrit 32.8, and platelets 205.   DISCHARGE DIAGNOSES:  1. Acute appendicitis with perforation and right lower quadrant      abscess.  2. Malnutrition.  3. Glaucoma.  4. Depression.  5. Anxiety.  6. Insomnia.  7. Osteoarthritis and degenerative disk disease of the lumbar spine.  8. Osteoporosis with several compression fractures of the lumbar      spine.  9. Benign prostatic hypertrophy with elevated PSA level.  10.History of hairy cell leukemia, currently in remission.  11.Hypertension.  12.Bilateral hearing loss.  13.Gastroesophageal reflux disease.  14.Coronary artery disease.  15.Left lung nodule.  16.Dyslipidemia.  17.History of August 2008, right hand, wrist, and lumbar spine      epidural abscess with methicillin-sensitive Staphylococcus aureus.  18.Status post splenectomy in the 1990s.  19.Colon adenocarcinoma.  20.Hypokalemia, resolved   DISCHARGE MEDICATIONS:  1. Centrum Silver 1 tablet daily.  2. PreserVision 1 tablet daily.  3. Xalatan 0.005% 1 drop in each eye nightly.  4. Zoloft 50 mg daily.  5. Ambien 5 mg p.o. nightly p.r.n. sleep.  6. Vicodin 5/500 one tablet p.o. b.i.d. p.r.n. pain, #60 refill 3.  7. GlycoLax 17 g daily p.r.n. constipation.  8. Aspirin 81 mg daily.  9. Caltrate D 600 one tablet daily.  10.Fortical nasal spray 200 units intranasally once daily.  11.Florastor 1 capsule p.o. daily for 30 days.  12.Augmentin 500 mg p.o. b.i.d. for 10 days.  13.Flomax 0.4 mg p.o. daily, may hold  for upcoming eye surgery.   He was advised to stop taking chlorthalidone.   DISPOSITION AND FOLLOWUP:  He will be discharged to home in the a.m.  He  was advised to apply ice to his right lower quadrant incision line if it  is tender.  He was also advised to call his general surgeon if he  develops fever, new or increased belly pain, redness or drainage from  his wounds, nausea, vomiting, or diarrhea.  He was advised to have a  followup appointment with Dr. Dwain Sarna on Tuesday November 09, 2007 at  3:45 p.m.  He was advised to have a followup appointment with Dr. Jarome Matin at [facility] in approximately 2 weeks  following discharge.   Please note that the process of this discharge required 40 minutes.           ______________________________  Barry Dienes Eloise Harman, M.D.     DGP/MEDQ  D:  11/01/2007  T:  11/02/2007  Job:  [id number]   cc:   Angus Palms, M.D.  Madelynn Done, MD  Cliffton Asters, M.D.

## 2010-07-16 NOTE — Consult Note (Signed)
[name], [name]                 ACCOUNT NO.:  [id number]   MEDICAL RECORD NO.:  [id number]          PATIENT TYPE:  INP   LOCATION:  5526                         FACILITY:  [facility]   PHYSICIAN:  Juanetta Gosling, MDDATE OF BIRTH:  June 13, 1923   DATE OF CONSULTATION:  10/26/2007  DATE OF DISCHARGE:                                 CONSULTATION   CONSULTING SURGEON:  Juanetta Gosling, MD   REQUESTING PHYSICIAN:  Barry Dienes. Eloise Harman, MD   REASON FOR CONSULTATION:  Right lower quadrant abdominal pain.   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  He was admitted today  after experiencing 36 hours of abdominal pain.  The pain initially began  in the umbilical-supraumbilical area radiating out bilaterally,  persisted, and in the past 12 hours, the pain became more localized to  the right lower quadrant.  He has experienced nausea without vomiting.  He did try to vomit to see if it would help the symptoms but was unable  to really bring up any significant emesis.  He has had anorexia.  He has  had diarrhea and he has had fever.  Initially in the first 24 hours of  symptoms, his fever went up to 103 and now has been hanging in the 100-  101 degree range.  He has been admitted by medical services and started  on IV fluid hydration, electrolyte repletion, and orders for IV  antibiotics.  A CT of the abdomen and pelvis is also pending to help  delineate the appropriate diagnosis.  In addition, the patient is  complaining of weakness and blood pressure lower than normal.  We have  been asked to evaluate the person for possible surgical intervention.   REVIEW OF SYSTEMS:  As per the history present illness.  CONSTITUTIONAL:  These were abrupt onset of symptoms with fevers as described.  GI:  He  has no chronic GI symptoms other than some  mild GERD.  He has had a  prior small bowel obstruction after his colectomy surgery in 1980s.  This resolved with medical therapy only.  He reports no dark or bloody  stools and no dark or bloody emesis.  Otherwise, all review of systems  categories are negative or noncontributory to this assessment.   PAST MEDICAL HISTORY:  1. Hairy cell leukemia, currently in remission.  2. Hypertension.  3. Bilateral hearing loss.  4. Anemia, chronic.  5. Depression.  6. Chronic low back pain.  7. Gastroesophageal reflux disease.  8. CAD.  9. Left lung nodule, followed at [facility].  10.Dyslipidemia.  11.Prior lumbar spine epidural abscess, treated outpatient with      antibiotic therapy in August 2008.  This was followed by Dr.      Newell Coral as well as Dr. Orvan Falconer of Infectious Diseases.   PAST SURGICAL HISTORY:  1. Splenectomy in 1990s.  2. Left colectomy in the 1980s at [facility].  3. I and D of right wrist and  hand, on multiple occasions in August      2008 due to MRSA and abscess.   ALLERGIES:  SULFA which is childhood reaction.  The patient is uncertain  of reaction.   HOME MEDICATIONS:  1. Centrum Silver daily.  2. PreserVision.  3. Zoloft.  4. Xalatan for the eyes.  5. Aspirin 81 mg.  6. Chlorthalidone.  7. Caltrate.  8. Sustacal.  9. A nasal spray.   The patient has been ordered IV fluids here to run at 200 an hour for 5  hours.  This has 20 mEq of potassium in the fluid.  He has also been  ordered Zosyn, Zofran, Protonix, and Phenergan as well as p.r.n. Tylenol  and Lovenox for DVT prophylaxis.   PHYSICAL EXAMINATION:  GENERAL:  Pleasant male patient, complaining of  severe lower abdominal pain, right greater than left.  VITAL SIGNS:  Temperature 100.1, BP 92/49, pulse 70 and regular, and  respirations 20.  NEUROLOGIC:  Cranial nerves II through XII are grossly intact except for  the previously mentioned bilateral hearing loss.  He is moving all  extremities x4.  His sensation  is intact in the upper and lower  extremities bilaterally and his strength is normal bilaterally.  PSYCHIATRIC:  The patient is alert and oriented x3.  His affect is  appropriate to current situation.  PULMONARY:  Bilateral lung sounds are clear to auscultation anteriorly.  He is saturating 91% on room air.  He is not tachypneic.  CARDIOVASCULAR:  Heart sounds are S1-S2 without obvious rubs, murmurs,  thrills, or gallops.  No JVD.  Pulses regular.  No peripheral edema and  pulse is non-tachycardiac.  ABDOMEN:  Soft and nondistended.  He has diminished bowel sounds.  He is  quite tender even with just laying the stethoscope across the left  abdomen region.  He has a well midline scar arising from the umbilical  region up to the xiphoid.  No hernias noted.  He has significant  guarding and rebounding and peritoneal signs, right greater than left.  EXTREMITIES:  Symmetrical in appearance without cyanosis or clubbing.   LABORATORY DATA:  Blood cultures are pending.  White count is 12,900.  The patient states this is twice his normal baseline.  Hemoglobin 13.9  and platelets 176,000.  Sodium 136, potassium 2.7, CO2 of 24, glucose  136, BUN 27, and creatinine 1.41.  Diagnostic CT of the abdomen and  pelvis with oral and IV contrast are pending.  The patient has just now  received his oral contrast.   IMPRESSION:  1. Right lower quadrant pain with peritoneal signs.  Differential      includes acute appendicitis versus diverticulitis with abscess      versus possible perforated viscus.  2. Leukocytosis with systemic inflammatory response syndrome/early      sepsis.  3. Volume depletion and hypokalemia.  4. Relative hypotension.  5. Other chronic medical problems as listed.   PLAN:  1. Need to follow up on the CT scan.  If consistent with uncomplicated      appendectomy, that would be amenable to urgent OR.  Plan on      appendectomy tonight.  Given his prior abdominal surgery, I did       discuss with the patient and his family that although they may      attempt a laparoscopic approach, he may end up with an open      procedure.  I also discussed with the family that if he  has      perforation especially with a significant complex abscess, that      surgery may need to be delayed with interval appendectomy after      appropriate drainage of abscess and antibiotic therapy and drainage      per Interventional Radiology.  2. If this is a diverticular process, then bowel rest and antibiotics      and treatment of symptoms including nausea and pain.  If no      significant improvement within 24-36 hours, the patient may end up      needing urgent surgical treatment with colectomy and colostomy.      This could be the same case as the patient has a perforated viscus,      although surgery would be a more urgent if he has a perforated      viscus.  3. Agree with aggressive rehydration and broad-spectrum antibiotics.      Follow up on blood cultures as well.  The patient is having a      relative hypotension as well as had had significant fevers at home      and again this is concerning for an evolving sepsis, especially      given that the patient is older and has had prior splenectomy and      has immune compromise secondary to this.  4. Additional recommendations per Dr. Dwain Sarna and per Dr. Carolynne Edouard III,      who is the covering physician this evening.      Allison L. Marya Landry, MD  Electronically Signed    ALE/MEDQ  D:  10/26/2007  T:  10/27/2007  Job:  [id number]

## 2010-07-16 NOTE — Op Note (Signed)
[name], [name]                 ACCOUNT NO.:  [id number]   MEDICAL RECORD NO.:  [id number]          PATIENT TYPE:  INP   LOCATION:  6702                         FACILITY:  [facility]   PHYSICIAN:  Madelynn Done, MD  DATE OF BIRTH:  [birth year]   DATE OF PROCEDURE:  10/11/2006  DATE OF DISCHARGE:                               OPERATIVE REPORT   PREOPERATIVE DIAGNOSIS:  Right wrist infectious arthritis with  involvement of the radiocarpal, midcarpal joint.   POSTOPERATIVE DIAGNOSIS:  Right wrist infectious arthritis with  involvement of the radiocarpal, midcarpal joint.   ATTENDING SURGEON:  Dr. Gilman Schmidt who was scrubbed and present for the  entire procedure.   ASSISTANT SURGEON:  None.   PROCEDURE:  Incision and drainage of right wrist with arthrotomy and  joint debridement of radiocarpal and midcarpal joint.   DRAINS:  One TLS drain.   ANESTHESIA:  General via laryngeal mask airway.   INTRAOPERATIVE FINDINGS:  The patient did still have gross purulence  within the wrist joint as well as of the midcarpal joint more so within  the scaphotrapezial joint and STT joints.   SURGICAL INDICATIONS:  [name] is a 77 year old right-hand-dominant  gentleman taken to the operating room on 10/09/2006 and into the early  morning of 10/10/2006 with an infection in his right wrist.  The patient  was scheduled to undergo a repeat I&D of his right wrist for infection.  He had been on IV antibiotics.  His condition improved but secondary to  his infection, slight immunocompromise it was felt indicated for him to  return back to the operating room for repeat incision and drainage.  A  signed informed consent was obtained on the day of surgery.   DESCRIPTION OF PROCEDURE:  The patient was properly identified in preop  holding area.  A mark with a permanent  marker was made on the right  wrist indicate correct operative site.  The patient brought back to the  operating room, placed supine  on the anesthesia table.  General  anesthesia was administered via LMA.  The patient tolerated the  procedure well.  The patient was receiving preoperative antibiotics.  A  well-padded tourniquet was then placed on the right forearm and sealed  with a 1000 drape.  The right upper extremity was then prepped and  draped with Betadine.  A time-out was called, the correct site was  identified.  The surgical procedure was then begun.  Previous skin  sutures were then removed.  The limb was then elevated using elevation  only, tourniquet insufflated to 250 mmHg.  Dissection was carried down  through the skin and subcutaneous tissues.  A small portion of the  retinaculum overlying the fourth dorsal compartment was released.  The  previous capsulotomy was then incised and the joint was opened and the  arthrotomy completed.  There was a small amount of purulence coming from  the dorsal part of the hand along the fourth dorsal compartment as well  as a area of purulence from the scaphotrapezial joint.  It did not  appear to  involve the first [facility] joint.  After debridement of skin and  subcutaneous tissues as well as joint debridement using the cysto tubing  the joint was irrigated with 3 liters of a saline solution.  After a  joint debridement and irrigation, the tourniquet was then deflated.  The  small TLS drain, a #10 TLS drain was then placed in the deep tissues.  The skin was then closed loosely over the drains with a 4-0 nylon  suture.  Sterile Adaptic and a sterile compressive dressing was then  applied.  The patient was then placed in a well-padded volar splint.  The patient was then extubated and taken to recovery room in good  condition.   POSTOPERATIVE PLAN:  The patient will continue on the IV antibiotics and  will await final cultures and sensitivities.  It was growing out staph  aureus.  Sensitivities were not back.  The patient may require repeat  I&D within the next 48 hours  depending on how he responds to the current  treatment modalities.      Madelynn Done, MD  Electronically Signed     FWO/MEDQ  D:  10/11/2006  T:  10/12/2006  Job:  [id number]

## 2010-07-16 NOTE — Consult Note (Signed)
NAMEKATHY, WARES                 ACCOUNT NO.:  [id number]   MEDICAL RECORD NO.:  [id number]          PATIENT TYPE:  INP   LOCATION:  6702                         FACILITY:  [facility]   PHYSICIAN:  Madelynn Done, MD  DATE OF BIRTH:  1923/07/06   DATE OF CONSULTATION:  10/09/2006  DATE OF DISCHARGE:                                 CONSULTATION   REASON FOR CONSULTATION:  Right wrist pain and swelling.   Requesting MD: Dr. Ivery Quale   HISTORY OF PRESENT ILLNESS:  Mr. Cokley is an 77 year old right hand  dominant gentleman who noticed about one week ago the onset of pain and  swelling in his right wrist.  The patient was seen in [facility] and treated for a gouty inflammation of his wrist.  He  returned back to [place] and has been treated with oral antibiotics  as well as medications for gout but he is still having pain and problems  with that right wrist.  The patient was admitted to the hospital for  suspected infection to the right wrist.  I was consulted for the  management and treatment of his right wrist.  He was having subjective  fevers today.  He is still having pain in his right wrist and he cannot  sleep.  No night sweats.  No weight loss.  No other constitutional  symptoms.   PAST MEDICAL HISTORY:  Leukemia requiring a splenectomy.  He is  immunosuppressed on prednisone daily.  His other medical history is  reviewed in the medical chart.   MEDICATIONS:  See medication list in the chart.   SOCIAL HISTORY:  He is a nonsmoker.  He has his own business.  He is  married.  He is a native of [place].   FAMILY HISTORY:  No reaction to anesthesia.   Last meal 1730 on 10/09/2006.   PHYSICAL EXAMINATION:  On examination he is healthy-appearing white male  in no acute distress.  He appears a little bit uncomfortable and guards  his right wrist.  On examination of the right upper extremity he has  edema and swelling over the radial carpal joint of  the right wrist.  He  has pain with passive motion of his wrist as well as ulnar deviation and  forearm rotation.  He has warmth and redness over the dorsal part of his  hand.  No areas of fluctuance, no abscess collection.  He is able to  extend his thumb extend his digits, make the a-okay sign, cross his  fingers, abduct and adduct the digits.  His fingertips are warm, well-  perfused with good capillary refill.  Sensation light touch is present  distally.  He has limited motion of his hand secondary to pain and  swelling.  He has no scars to the hand.   RADIOGRAPHY:  AP and lateral films of the hand and wrist do show  joint  interval narrowing of the radiocarpal joint.  There is slight  dorsiflexed posture of the lunate and flexed posture of the scaphoid  suggestive of a scaphoid lunate  advanced collapse pattern.  He also has  arthrosis in his mid carpal joint as well.   IMPRESSION:  Right wrist and pain and swelling, likely infectious  arthritis of the right wrist.   The patient has been having pain and swelling in right wrist which has  been unresponsive to gout medication as well as antibiotics.  It was my  recommendation that he undergo an aspiration of that right wrist.  The  patient elected to proceed.  An 18 gauge needle was then used in the 6 U  portal site  and entered into the radiocarpal joint.  Upon entering in  the radiocarpal joint aspiration fluid was obtained and purulent  material was aspirated.  Several mL of purulent material were retrieved.  This was then sent down to the laboratory for analysis.   We will review these as stat findings from the aspiration.  The patient  is likely going to require incision and drainage for infectious  arthritis of his right wrist.  I will be in contact with the patient  regarding the culture results.  The patient's questions were answered.  The patient tolerated the procedure and I will  continue to follow the   patient.      Madelynn Done, MD  Electronically Signed     FWO/MEDQ  D:  10/09/2006  T:  10/11/2006  Job:  [id number]

## 2010-07-16 NOTE — Op Note (Signed)
NAMEGRANGER, CHUI                 ACCOUNT NO.:  [id number]   MEDICAL RECORD NO.:  [id number]          PATIENT TYPE:  INP   LOCATION:  6741                         FACILITY:  [facility]   PHYSICIAN:  Madelynn Done, MD  DATE OF BIRTH:  07-17-1923   DATE OF PROCEDURE:  10/15/2006  DATE OF DISCHARGE:                               OPERATIVE REPORT   PREOPERATIVE DIAGNOSIS:  Right wrist infectious arthritis.   POSTOPERATIVE DIAGNOSIS:  Right wrist infectious arthritis.   SURGEON:  Madelynn Done, M.D. who was scrubbed and present for the  entire procedure.   ASSISTANT SURGEON:  None.   PROCEDURE:  Arthrotomy, right wrist, and drainage and debridement.   ANESTHESIA:  General via LMA.   DRAINS:  Two Penrose drains.   INTRAOPERATIVE FINDINGS:  The patient did still have a small amount of  purulence from this mid carpal joint and subcutaneous tissues.  The  wound did look better than previous operations.  The radiocarpal joint  looked good.   INDICATIONS:  Mr. Bohr is an 77 year old, right-hand-dominant gentleman  who had been taken to the operating room previously for washing out of  his infectious arthritis of his right wrist and mid carpal joint.  The  patient was seen and evaluated and still had purulence coming from his  incision site.  It was felt that he needed to be returned back to the  operating room for repeat irrigation and debridement.  A signed informed  consent was obtained on the day of procedure.  Risks, benefits and  alternatives were discussed in detail with the patient and the patient's  wife.   DESCRIPTION OF PROCEDURE:  The patient was properly identified in the  preoperative holding area.  Loraine Leriche was made on the right wrist to indicate  correct operative site.  The patient was then brought back to the  operating room, placed supine on the anesthesia table.  General  anesthesia was administered via LMA.  The patient tolerated this well.  The patient had  received preoperative antibiotics.  The right upper  extremity were prepped and draped.  A well-padded tourniquet was then  placed on the right brachium and sealed with the 1000 drape.  The right  upper extremity were then prepped with Betadine and sterilely draped.  A  time-out was called, the correct site was identified.  The surgical  procedure was then begun.   The previous sutures were then removed.  The dissection was carried down  bluntly through the skin and subcutaneous tissues.  The capsule was then  identified to the right wrist.  The Monocryl sutures were then removed  that has been tacking the capsular back. The capsular incision was then  lengthened longitudinally to expose the entire mid carpal joint as well  as the [facility] joint.  The joints were then opened.  Did not appear to be  any purulence coming from the [facility] joint.  The long finger metacarpal  were index metacarpal.  There was a small amount of purulence coming  from the subcutaneous tissues and along the region of  the  trapezoidal  ulna, the region of the capitate and the trapezoid articulation.  The  pulsatile lavage was then used to thoroughly irrigate the wrist joint  and mid carpal joint. Nine liters of irrigation were then placed  throughout the wrist and subcutaneous tissues.  After adequate  irrigation and debridement, 500 mL of antibiotic solution was then  irrigated throughout the joint.  The capsule was then loosely  reapproximated with three  4-0 Monocryl sutures.  The skin was then  closed over two Penrose drains with 4-0 nylon sutures.  Adaptic was then  applied to the wound.  Sterile compressive dressings were then applied.  The patient then placed in well-padded volar splint.  The patient was  extubated, taken to recovery room in good condition.   POSTOPERATIVE PLAN:  The patient will continue on the IV antibiotics.  Will look at the wound within 48 hours and remove the drains and see  whether or not  he needs a repeat I&D.      Madelynn Done, MD  Electronically Signed     FWO/MEDQ  D:  10/15/2006  T:  10/16/2006  Job:  [id number]

## 2010-07-16 NOTE — H&P (Signed)
[name], [name]                 ACCOUNT NO.:  [id number]   MEDICAL RECORD NO.:  [id number]          PATIENT TYPE:  INP   LOCATION:  6702                         FACILITY:  [facility]   PHYSICIAN:  [name]. [name], M.D.DATE OF BIRTH:  May 09, 1923   DATE OF ADMISSION:  10/09/2006  DATE OF DISCHARGE:                              HISTORY & PHYSICAL   CHIEF COMPLAINT:  Persistent pain and swelling of the right hand, now  associated with fever.   HISTORY OF PRESENT ILLNESS:  The patient is a 77 year old white male,  who approximately 8 days ago began to develop pain and slight redness  and edema of the right hand.  He was seen at an outside hospital on  August 1 and felt to possibly have gout, so he was started on Indocin 25  mg t.i.d. and Percocet p.r.n.  He was seen in our office on August 4,  and it was noted that his right wrist and hand were erythematous and  tender, so colchicine 0.6 mg twice daily was started, and a serum uric  acid level was obtained.  On August 6, he represented to our office, due  to persistent pain and edema in the right hand.  At that time, he denied  fever or chills.  His previous labs were significant for serum uric acid  level of 4.8, BUN 29, creatinine 0.9.  At that time, there was increased  erythema of the right hand and right wrist with significant tenderness  and worsening of pain with dorsiflexion, so further labs were obtained  including a CBC and erythrocyte sedimentation rate, as well as x-rays of  the hand, after which prednisone was to be started.  Despite these  measures, his right hand pain and edema worsened over the next 2 days.  He developed fever and chills with a temperature of greater than 101 and  very mild shortness of breath without productive cough, so he was seen  in our office.  Of note, he had been boating 1 day prior to the onset of  his right hand pain, but he was not fishing and had no skin tears or  other injuries while boating.   He has no history of arthritis and had  not had any recent insect bites.   PAST MEDICAL HISTORY:  His past medical history is significant for a  diagnosis of hairy cell leukemia in May 1992, which was treated with  splenectomy and apheresis, as well as corticosteroid therapy from 1981  to 1984.  In addition, he has a history of sigmoid colon adenocarcinoma  that was [facility] B1 stage.  He also has a history of situational  depression, erectile dysfunction, dyspnea on exertion with a March 2007  Cardiolite exercise test, showing normal heart function and no evidence  of ischemia.  He also has a history of coronary artery disease, right  and a left lung nodule that was evaluated with bronchoscopy and serial  chest x-rays and CT scans.  He also has a history of elevated PSA levels  with normal range free PSA levels.  Lastly, he  has a history of  hypertension and bilateral hearing loss.  He has hyperlipidemia for  which he has declined medication.   MEDICATIONS:  Prior to admission:  1. PreserVision twice daily.  2. Centrum Silver once daily.  3. Zoloft 100 mg daily.  4. Aspirin 81 mg daily.  5. Toprol XL 50 mg take 1/2 tablet daily.  6. Prilosec 20 mg daily.  7. Prednisone 20 mg daily for the past 2 days.  8. Lisinopril 10 mg daily.   ALLERGIES:  SULFA DRUGS AND SOME ANESTHETICS, BUT LIDOCAINE IS OKAY.   SOCIAL HISTORY:  He is an married to very Photographer.  He has a tobacco  history that was discontinued in 1998 and no history of alcohol abuse.   FAMILY HISTORY:  His father died at age 98 of stroke and congestive  heart failure, and his mother is in her 69s and well.  A brother age 74  has had bypass surgery, diabetes mellitus, type 2, and prostate  carcinoma.  He has three daughters who are alive and well.   PAST SURGICAL HISTORY:  1939 tonsillectomy, 1982 splenectomy, 1984 colon  hemicolectomy, 1985 left cheek mole, 2000 left ankle fracture with open  reduction and internal fixation,  and 2006 operations on the lumbar spine  and shoulder.   REVIEW OF SYSTEMS:  Had recent fever and chills.  Has had no change in  his vision or chest pain or palpitations.  Has mild dyspnea on exertion  and a mild dry cough.  He has had some loose bowel movements but no  rectal bleeding.  He has no significant difficulty emptying his bladder.  He denies anxiety or depression.   ADMISSION LABORATORY STUDIES:  White blood cell count 19, hemoglobin 11,  hematocrit 32, platelets 235, serum sodium 135, potassium 4.1, chloride  105, carbon dioxide 23, BUN 25, creatinine 0.98, glucose 203, total  protein 5.8, albumin 2.4, calcium 8.2.   Recent pertinent laboratory tests include October 05, 2006, BUN 29,  creatinine 0.9, uric acid 4.8, and August 6 white blood cell count 16.7  with 86% granulocytes, hemoglobin 12.6, hematocrit 37.2, platelets 236.  Erythrocyte sedimentation rate 100 and August 6 right wrist x-ray  showing only osteoarthritis, the right hand x-ray showing only  osteoarthritis changes.   IMPRESSION AND PLAN:  1. Right hand pain and erythema and fever.  It is possible that his      constellation of symptoms is due to acute gouty arthritis.  His      poor response to medications for gout argues against this.      Although he has no skin breaks, it is possible that he has a      cellulitis or abscess developing in his right hand, particularly in      view of his immune system dysfunction due to splenectomy.  It is      unlikely that he has a autoimmune rheumatoid disease.  I plan to      continue doxycycline 100 mg twice daily that was started on August      6th.  He was started on Rocephin 1 gram IV in the office.  In      addition for now, we will fully broaden his spectrum by adding      vancomycin IV, for the possibility of Staphylococcus aureus      infection and abscess.  I have asked a orthopedic consultant to      evaluate him for the possibility of wound  aspiration.  For  now, we      will continue colchicine treatment with nonsteroidal anti-      inflammatory drug treatment.  2. Leukocytosis:  Again, most likely due to acute gouty arthritis      versus infection.  He is to be on broad-spectrum antibiotic      treatment, initially while we define the source of his right hand      edema.  3. Hyperglycemia:  Moderate and likely due to the stress of fever in      the face of starting prednisone treatment.  4. History of hairy cell leukemia:  Stable.  5. History of colon adenocarcinoma:  Stable.  6. Elevated blood pressure:  Stable.  Given likely intravascular      volume depletion with his fever, we will hold his ACE inhibitor      treatment for now.           ______________________________  [name]. [name], M.D.     DGP/MEDQ  D:  10/09/2006  T:  10/11/2006  Job:  [id number]   cc:   Jillyn Hidden B. Truett Perna, M.D.  Cassell Clement, M.D.  Lenord Fellers

## 2010-07-16 NOTE — H&P (Signed)
NAME:  [name], [name]                 ACCOUNT NO.:  [id number]   MEDICAL RECORD NO.:  [id number]          PATIENT TYPE:  INP   LOCATION:  5526                         FACILITY:  [facility]   PHYSICIAN:  Barry Dienes. Eloise Harman, M.D.DATE OF BIRTH:  [birth year]   DATE OF ADMISSION:  10/26/2007  DATE OF DISCHARGE:                              HISTORY & PHYSICAL   CHIEF COMPLAINT:  Abdominal pain with fever.   HISTORY OF PRESENT ILLNESS:  The patient is an 77 year old Caucasian man  with multiple medical problems, who was seen yesterday in our office  with a complaint of vague midabdominal pain that started earlier in the  morning and was associated with nausea and vomiting x1.  He had eaten  some soup subsequently and denied constipation, diarrhea, or dysuria.  In our office, he had a temperature of 100.3 degrees Fahrenheit with  otherwise normal vital signs and mild right lower quadrant tenderness  without rebound.  A urinalysis was unremarkable and a CBC was notable  for a white blood cell count 13.3.  He was given Rocephin 2 g IM with a  prescription to start Augmentin today, and advised to call us should his  symptoms worsen.  For a few hours, he felt a bit better, although he  awoke with a temperature of 103.8 degrees with chills and moderate right  lower quadrant pain that was worse with moving his right lower  extremity.  He continues to deny productive cough, shortness of breath,  chest pain, dysuria, frequency, diarrhea, worsened back pain, or joint  pain.  He continues to have mild nausea, but has not had vomiting or  headache today.   PAST MEDICAL HISTORY:  August 2008, hospital admission for methicillin-  sensitive Staphylococcus aureus infection of the right hand and wrist  that required incision and debridement and with epidural abscess that  has resolved on MRI scan and symptomatically.  He was treated with  prolonged IV Keflex and had done well.  He also has a history of severe  lumbar spine degenerative disk disease, osteoarthritis, compression  fractures, immune compromised due to remote splenectomy for hairy cell  leukemia, anemia, hyponatremia, malnutrition, hypertension, depression,  gastroesophageal reflux in 1992, diagnosis of hairy cell leukemia, early  stage colon cancer, coronary artery disease, left lung nodule being  followed by [facility], elevated PSA levels in the 12 range, again  followed by [facility], bilateral sensorineural hearing loss, and  hyperlipidemia.   MEDICATIONS PRIOR TO ADMISSION:  1. Centrum Silver 600 one tab daily.  2. PreserVision daily.  3. Zoloft 50 mg daily.  4. Xalatan 0.005% eye drops to both eyes q.h.s.  5. Aspirin 81 mg daily.  6. Chlorthalidone 25 mg daily.  7. Caltrate 600 mg daily.  8. Fortical nasal spray, take 200 units daily.   ALLERGIES:  Sulfa drugs and anesthetics.   PAST SURGICAL HISTORY:  In 1939 tonsillectomy, 1982 splenectomy, 1984  hemicolectomy, 1985 left cheek mole excision, 2000 left ankle fracture  with open reduction with internal fixation, and February 2006 shoulder  operation and lumbar spine operation.   FAMILY  HISTORY:  His father died at age 56 of stroke and congestive  heart failure.  His mother is in her 82s and well.  A brother aged 33  had bypass surgery, diabetes mellitus type 2, and prostate carcinoma.  He has 3 daughters who are alive and well.   SOCIAL HISTORY:  He is married and married to Kincaid.  He had a history  of tobacco use that was discontinued in 1998 and no history of alcohol  abuse.   REVIEW OF SYSTEMS:  See history of present illness.   PHYSICIANS INVOLVED IN HIS CARE:  Hewitt Shorts, M.D.  (neurosurgery), Leighton Roach. Truett Perna, M.D. (oncology), Jeannett Senior. Pollyann Kennedy, MD  (ear, nose, and throat), Madlyn Frankel. Charlann Boxer, M.D. (orthopedics), and Lenord Fellers, division of medical oncology, case #[id number], [facility].   PHYSICAL EXAMINATION:  VITAL  SIGNS:  Blood pressure 102/59, pulse 76,  respirations 20, temp 99.5, and pulse oxygen saturation 94% on room air.  GENERAL:  He is an elderly white male, who is somewhat flushed, but  otherwise in no apparent distress while lying fully supine in bed.  HEAD, EYES, EARS, NOSE, AND THROAT:  Within normal limits.  NECK:  Supple without jugular venous distention or carotid bruit.  CHEST:  Clear to auscultation.  HEART:  Regular rate and rhythm with a systolic ejection murmur of grade  2/6 at the left sternal border.  ABDOMEN:  Markedly decreased bowel sounds with no hepatomegaly and mild  right lower quadrant tenderness without rebound.  EXTREMITIES:  Bilateral trace ankle edema.  NEUROLOGICAL:  He was alert and well oriented.  Cranial nerves II-XII  were significant for mildly decreased hearing bilaterally.  Sensory exam  was grossly normal and he was able to move all extremities well.   LABORATORY STUDIES:  White blood cell count 12.9, hemoglobin 13,  hematocrit 41, platelets 176, and serum potassium 2.7.   October 25, 2007, white blood cell count 13.3 with 72% granulocytes,  hemoglobin 14.3, hematocrit 43.8, and platelets 237.  Urinalysis normal.   June 29, 2007, labs significant for BUN 24, creatinine 1.3, sodium 147,  potassium 4.9, chloride 106, carbon dioxide 30, total protein 6.6,  albumin 3.8, alkaline phosphatase 100, and PSA 19.5.   IMPRESSION AND PLAN:  1. Right lower quadrant abdominal pain:  Most likely due to acute      diverticulitis given the fever, leukocytosis, and location.  Less      likely etiologies would be appendicitis, as the appendix was      probably removed with one of the abdominal surgeries or urinary      tract infection given his urinalysis findings yesterday.  I plan to      check blood cultures x2 sets with acute abdomen series x-rays and a      CT scan of the abdomen and pelvis.  We will broaden his antibiotic      spectrum with Zosyn rather than  Augmentin or Rocephin.  A general      surgeon will be asked to evaluate him for the possibility of      surgical drainage of an apparent abscess.  2. History of epidural abscess:  Clinically resolved with Jul 21, 2007, lumbar spine MRI exam showing complete resolution of the      epidural abscess with multilevel degenerative changes.  3. Hypokalemia:  Moderately severe and likely due to ongoing      chlorthalidone treatment.  I  plan to discontinue chlorthalidone,      administer IV fluids with potassium, and give a 1 time dose of      Aldactone to help normalize the      potassium level.  4. Hypertension:  Well controlled on his current medications.  5. History of hairy cell leukemia:  Clinically resolved with 1982      splenectomy.            ______________________________  Barry Dienes Eloise Harman, M.D.     DGP/MEDQ  D:  10/26/2007  T:  10/27/2007  Job:  [id number]

## 2010-07-16 NOTE — Op Note (Signed)
NAMEJARIUS, [name]                 ACCOUNT NO.:  [id number]   MEDICAL RECORD NO.:  [id number]          PATIENT TYPE:  INP   LOCATION:  5526                         FACILITY:  [facility]   PHYSICIAN:  Juanetta Gosling, MDDATE OF BIRTH:  04/20/1923   DATE OF PROCEDURE:  10/27/2007  DATE OF DISCHARGE:                               OPERATIVE REPORT   PREOPERATIVE DIAGNOSIS:  Acute appendicitis.   POSTOPERATIVE DIAGNOSIS:  Perforated appendicitis.   PROCEDURE:  Open appendectomy.   SURGEON:  Juanetta Gosling, MD   ASSISTANT:  Wilmon Arms. Tsuei, MD   ANESTHESIA:  General endotracheal anesthesia.   ESTIMATED BLOOD LOSS:  Minimal.   COMPLICATIONS:  None.   SPECIMENS:  Appendix to pathology.   DRAINS:  None.   INDICATIONS:  This is an 77 year old male with a history of umbilical  pain migrated to his right lower quadrant who presents with a white  count and fever and has a CT scan that appears to have acute  appendicitis.  He delayed the operation as he wanted to discuss this  with his oncologist at [facility].  After this was  discussed and on my recommendation, we proceeded with open appendectomy.   PROCEDURE:  After informed consent was obtained, the patient was taken  to the operating room, was placed under general endotracheal anesthesia  without complication.  He was receiving Zosyn intravenously on the  floor.  Prior to this, sequential compression devices were placed on his  lower extremities.  Abdomen was then prepped and draped in standard  sterile surgical fashion.  A right lower quadrant incision was then made  and Bovie electrocautery was carried down to the external abdominal  oblique, which was incised.  The muscle was then split with Kelly  clamps, peritoneum identified, and entered sharply.  A Balfour retractor  was then inserted.  He was noted to have a lot of exudate on his small  bowel and on his colon and his right lower quadrant and his  appendix was  retrocecal and this was eventually delivered using finger dissection  into the wound.  This was noted to be necrotic.  There was an  appendicolith free floating in the right lower quadrant and indicated  this had been perforated.  The base was clean.  GIA stapler, 55 stapler  was then used to come across the base of the appendix, the appendiceal  stump was then dunked using 3-0 GI silks.  Copious irrigation was  performed.  The peritoneum was then closed with 0-Vicryl.  The  external abdominal oblique was closed with a 2-0 PDS.  More irrigation  was performed.  His wound was loosely stapled together and a sterile  dressing was applied.  He tolerated this well and was transferred to  PACU in stable condition.      Juanetta Gosling, MD  Electronically Signed     MCW/MEDQ  D:  10/27/2007  T:  10/28/2007  Job:  [id number]

## 2010-11-18 DIAGNOSIS — H3340 Traction detachment of retina, unspecified eye: Secondary | ICD-10-CM | POA: Insufficient documentation

## 2010-12-04 LAB — CBC
HCT: 30.6 — ABNORMAL LOW
Hemoglobin: 10.3 — ABNORMAL LOW
MCHC: 33.5
MCV: 91.5
Platelets: 229
RBC: 3.35 — ABNORMAL LOW
RDW: 16.6 — ABNORMAL HIGH
WBC: 8.9

## 2010-12-04 LAB — COMPREHENSIVE METABOLIC PANEL
ALT: 43
AST: 51 — ABNORMAL HIGH
Albumin: 1.9 — ABNORMAL LOW
Alkaline Phosphatase: 84
BUN: 25 — ABNORMAL HIGH
CO2: 25
Calcium: 8.1 — ABNORMAL LOW
Chloride: 109
Creatinine, Ser: 1.16
GFR calc Af Amer: >60
GFR calc non Af Amer: >60
Glucose, Bld: 101 — ABNORMAL HIGH
Potassium: 3.8
Sodium: 140
Total Bilirubin: 0.6
Total Protein: 4.7 — ABNORMAL LOW

## 2010-12-13 LAB — COMPREHENSIVE METABOLIC PANEL
ALT: 21
ALT: 40
AST: 25
AST: 40 — ABNORMAL HIGH
Albumin: 1.9 — ABNORMAL LOW
Albumin: 2.2 — ABNORMAL LOW
Alkaline Phosphatase: 113
Alkaline Phosphatase: 115
BUN: 16
BUN: 21
CO2: 27
CO2: 29
Calcium: 7.9 — ABNORMAL LOW
Calcium: 8.5
Chloride: 106
Chloride: 99
Creatinine, Ser: 0.73
Creatinine, Ser: 0.79
GFR calc Af Amer: >60
GFR calc Af Amer: >60
GFR calc non Af Amer: >60
GFR calc non Af Amer: >60
Glucose, Bld: 110 — ABNORMAL HIGH
Glucose, Bld: 99
Potassium: 3.9
Potassium: 4.2
Sodium: 134 — ABNORMAL LOW
Sodium: 137
Total Bilirubin: 0.4
Total Bilirubin: 0.9
Total Protein: 4.6 — ABNORMAL LOW
Total Protein: 5.6 — ABNORMAL LOW

## 2010-12-13 LAB — BASIC METABOLIC PANEL
BUN: 11
BUN: 14
BUN: 15
CO2: 27
CO2: 29
CO2: 29
Calcium: 8.1 — ABNORMAL LOW
Calcium: 8.2 — ABNORMAL LOW
Calcium: 8.6
Chloride: 101
Chloride: 103
Chloride: 104
Creatinine, Ser: 0.74
Creatinine, Ser: 0.75
Creatinine, Ser: 0.86
GFR calc Af Amer: >60
GFR calc Af Amer: >60
GFR calc Af Amer: >60
GFR calc non Af Amer: >60
GFR calc non Af Amer: >60
GFR calc non Af Amer: >60
Glucose, Bld: 130 — ABNORMAL HIGH
Glucose, Bld: 95
Glucose, Bld: 99
Potassium: 3.8
Potassium: 3.9
Potassium: 4.1
Sodium: 136
Sodium: 136
Sodium: 138

## 2010-12-13 LAB — CBC
HCT: 27.8 — ABNORMAL LOW
HCT: 28 — ABNORMAL LOW
HCT: 28.7 — ABNORMAL LOW
HCT: 28.9 — ABNORMAL LOW
HCT: 29.1 — ABNORMAL LOW
HCT: 30.2 — ABNORMAL LOW
Hemoglobin: 10 — ABNORMAL LOW
Hemoglobin: 9.3 — ABNORMAL LOW
Hemoglobin: 9.5 — ABNORMAL LOW
Hemoglobin: 9.6 — ABNORMAL LOW
Hemoglobin: 9.6 — ABNORMAL LOW
Hemoglobin: 9.7 — ABNORMAL LOW
MCHC: 32.9
MCHC: 33
MCHC: 33.2
MCHC: 33.3
MCHC: 33.8
MCHC: 34.1
MCV: 88.9
MCV: 89
MCV: 90.1
MCV: 90.2
MCV: 90.3
MCV: 90.4
Platelets: 279
Platelets: 303
Platelets: 342
Platelets: 433 — ABNORMAL HIGH
Platelets: 506 — ABNORMAL HIGH
Platelets: 506 — ABNORMAL HIGH
RBC: 3.11 — ABNORMAL LOW
RBC: 3.13 — ABNORMAL LOW
RBC: 3.2 — ABNORMAL LOW
RBC: 3.22 — ABNORMAL LOW
RBC: 3.23 — ABNORMAL LOW
RBC: 3.35 — ABNORMAL LOW
RDW: 15.3 — ABNORMAL HIGH
RDW: 15.5 — ABNORMAL HIGH
RDW: 15.8 — ABNORMAL HIGH
RDW: 15.8 — ABNORMAL HIGH
RDW: 15.9 — ABNORMAL HIGH
RDW: 16.1 — ABNORMAL HIGH
WBC: 11.8 — ABNORMAL HIGH
WBC: 13 — ABNORMAL HIGH
WBC: 7.6
WBC: 8.2
WBC: 9
WBC: 9.3

## 2010-12-13 LAB — DIFFERENTIAL
Basophils Absolute: 0
Basophils Relative: 0
Eosinophils Absolute: 0.1
Eosinophils Relative: 1
Lymphocytes Relative: 12
Lymphs Abs: 1.4
Monocytes Absolute: 1.5 — ABNORMAL HIGH
Monocytes Relative: 13 — ABNORMAL HIGH
Neutro Abs: 8.7 — ABNORMAL HIGH
Neutrophils Relative %: 74

## 2010-12-13 LAB — SEDIMENTATION RATE
Sed Rate: 50 — ABNORMAL HIGH
Sed Rate: 54 — ABNORMAL HIGH

## 2010-12-13 LAB — HIGH SENSITIVITY CRP: CRP, High Sensitivity: 86.5 — ABNORMAL HIGH

## 2010-12-16 LAB — BODY FLUID CULTURE

## 2010-12-16 LAB — ANAEROBIC CULTURE

## 2010-12-16 LAB — CULTURE, ROUTINE-ABSCESS

## 2010-12-16 LAB — SYNOVIAL CELL COUNT + DIFF, W/ CRYSTALS
Crystals, Fluid: NONE SEEN
Eosinophils-Synovial: 0
Lymphocytes-Synovial Fld: 4
Monocyte-Macrophage-Synovial Fluid: 10 — ABNORMAL LOW
Neutrophil, Synovial: 86 — ABNORMAL HIGH
WBC, Synovial: 195000 — ABNORMAL HIGH

## 2010-12-16 LAB — CBC
HCT: 30.1 — ABNORMAL LOW
HCT: 32 — ABNORMAL LOW
HCT: 32.3 — ABNORMAL LOW
Hemoglobin: 10.2 — ABNORMAL LOW
Hemoglobin: 10.8 — ABNORMAL LOW
Hemoglobin: 11.1 — ABNORMAL LOW
MCHC: 33.8
MCHC: 33.9
MCHC: 34.3
MCV: 88.6
MCV: 89.1
MCV: 89.1
Platelets: 214
Platelets: 235
Platelets: 253
RBC: 3.4 — ABNORMAL LOW
RBC: 3.59 — ABNORMAL LOW
RBC: 3.63 — ABNORMAL LOW
RDW: 15.8 — ABNORMAL HIGH
RDW: 15.9 — ABNORMAL HIGH
RDW: 16.4 — ABNORMAL HIGH
WBC: 13.2 — ABNORMAL HIGH
WBC: 19 — ABNORMAL HIGH
WBC: 19.4 — ABNORMAL HIGH

## 2010-12-16 LAB — COMPREHENSIVE METABOLIC PANEL
ALT: 48
ALT: 48
AST: 46 — ABNORMAL HIGH
AST: 49 — ABNORMAL HIGH
Albumin: 1.9 — ABNORMAL LOW
Albumin: 2.4 — ABNORMAL LOW
Alkaline Phosphatase: 105
Alkaline Phosphatase: 120 — ABNORMAL HIGH
BUN: 20
BUN: 25 — ABNORMAL HIGH
CO2: 23
CO2: 24
Calcium: 7.8 — ABNORMAL LOW
Calcium: 8.2 — ABNORMAL LOW
Chloride: 105
Chloride: 107
Creatinine, Ser: 0.91
Creatinine, Ser: 0.98
GFR calc Af Amer: >60
GFR calc Af Amer: >60
GFR calc non Af Amer: >60
GFR calc non Af Amer: >60
Glucose, Bld: 103 — ABNORMAL HIGH
Glucose, Bld: 203 — ABNORMAL HIGH
Potassium: 3.8
Potassium: 4.1
Sodium: 135
Sodium: 137
Total Bilirubin: 0.8
Total Bilirubin: 0.8
Total Protein: 4.7 — ABNORMAL LOW
Total Protein: 5.8 — ABNORMAL LOW

## 2010-12-16 LAB — GRAM STAIN

## 2010-12-16 LAB — SEDIMENTATION RATE
Sed Rate: 41 — ABNORMAL HIGH
Sed Rate: 58 — ABNORMAL HIGH

## 2011-02-01 HISTORY — PX: MOHS SURGERY: SUR867

## 2011-03-11 DIAGNOSIS — H353290 Exudative age-related macular degeneration, unspecified eye, stage unspecified: Secondary | ICD-10-CM | POA: Diagnosis not present

## 2011-03-11 DIAGNOSIS — Z961 Presence of intraocular lens: Secondary | ICD-10-CM | POA: Diagnosis not present

## 2011-03-11 DIAGNOSIS — H3581 Retinal edema: Secondary | ICD-10-CM | POA: Diagnosis not present

## 2011-03-11 DIAGNOSIS — H43829 Vitreomacular adhesion, unspecified eye: Secondary | ICD-10-CM | POA: Diagnosis not present

## 2011-03-11 DIAGNOSIS — H35379 Puckering of macula, unspecified eye: Secondary | ICD-10-CM | POA: Diagnosis not present

## 2011-03-11 DIAGNOSIS — H43819 Vitreous degeneration, unspecified eye: Secondary | ICD-10-CM | POA: Diagnosis not present

## 2011-03-11 DIAGNOSIS — H3340 Traction detachment of retina, unspecified eye: Secondary | ICD-10-CM | POA: Diagnosis not present

## 2011-04-15 DIAGNOSIS — H3340 Traction detachment of retina, unspecified eye: Secondary | ICD-10-CM | POA: Diagnosis not present

## 2011-04-15 DIAGNOSIS — H43819 Vitreous degeneration, unspecified eye: Secondary | ICD-10-CM | POA: Diagnosis not present

## 2011-04-15 DIAGNOSIS — H353290 Exudative age-related macular degeneration, unspecified eye, stage unspecified: Secondary | ICD-10-CM | POA: Diagnosis not present

## 2011-04-15 DIAGNOSIS — H35379 Puckering of macula, unspecified eye: Secondary | ICD-10-CM | POA: Diagnosis not present

## 2011-04-21 DIAGNOSIS — Z79899 Other long term (current) drug therapy: Secondary | ICD-10-CM | POA: Diagnosis not present

## 2011-04-21 DIAGNOSIS — D485 Neoplasm of uncertain behavior of skin: Secondary | ICD-10-CM | POA: Diagnosis not present

## 2011-04-21 DIAGNOSIS — R911 Solitary pulmonary nodule: Secondary | ICD-10-CM | POA: Diagnosis not present

## 2011-04-21 DIAGNOSIS — L570 Actinic keratosis: Secondary | ICD-10-CM | POA: Diagnosis not present

## 2011-04-21 DIAGNOSIS — Z85828 Personal history of other malignant neoplasm of skin: Secondary | ICD-10-CM | POA: Diagnosis not present

## 2011-04-21 DIAGNOSIS — C9140 Hairy cell leukemia not having achieved remission: Secondary | ICD-10-CM | POA: Diagnosis not present

## 2011-04-21 DIAGNOSIS — R799 Abnormal finding of blood chemistry, unspecified: Secondary | ICD-10-CM | POA: Diagnosis not present

## 2011-04-21 DIAGNOSIS — R892 Abnormal level of other drugs, medicaments and biological substances in specimens from other organs, systems and tissues: Secondary | ICD-10-CM | POA: Diagnosis not present

## 2011-04-21 DIAGNOSIS — L820 Inflamed seborrheic keratosis: Secondary | ICD-10-CM | POA: Diagnosis not present

## 2011-04-21 DIAGNOSIS — L219 Seborrheic dermatitis, unspecified: Secondary | ICD-10-CM | POA: Diagnosis not present

## 2011-04-22 ENCOUNTER — Other Ambulatory Visit: Payer: Self-pay | Admitting: Neurosurgery

## 2011-05-06 ENCOUNTER — Other Ambulatory Visit: Payer: Self-pay

## 2011-05-13 DIAGNOSIS — H43829 Vitreomacular adhesion, unspecified eye: Secondary | ICD-10-CM | POA: Diagnosis not present

## 2011-05-13 DIAGNOSIS — H353290 Exudative age-related macular degeneration, unspecified eye, stage unspecified: Secondary | ICD-10-CM | POA: Diagnosis not present

## 2011-05-13 DIAGNOSIS — H43819 Vitreous degeneration, unspecified eye: Secondary | ICD-10-CM | POA: Diagnosis not present

## 2011-05-13 DIAGNOSIS — H3340 Traction detachment of retina, unspecified eye: Secondary | ICD-10-CM | POA: Diagnosis not present

## 2011-05-13 DIAGNOSIS — H3581 Retinal edema: Secondary | ICD-10-CM | POA: Diagnosis not present

## 2011-05-13 DIAGNOSIS — H35379 Puckering of macula, unspecified eye: Secondary | ICD-10-CM | POA: Diagnosis not present

## 2011-05-13 DIAGNOSIS — H35349 Macular cyst, hole, or pseudohole, unspecified eye: Secondary | ICD-10-CM | POA: Diagnosis not present

## 2011-06-10 DIAGNOSIS — D485 Neoplasm of uncertain behavior of skin: Secondary | ICD-10-CM | POA: Diagnosis not present

## 2011-06-10 DIAGNOSIS — L219 Seborrheic dermatitis, unspecified: Secondary | ICD-10-CM | POA: Diagnosis not present

## 2011-06-10 DIAGNOSIS — Z85828 Personal history of other malignant neoplasm of skin: Secondary | ICD-10-CM | POA: Diagnosis not present

## 2011-06-10 DIAGNOSIS — L820 Inflamed seborrheic keratosis: Secondary | ICD-10-CM | POA: Diagnosis not present

## 2011-06-17 DIAGNOSIS — H43819 Vitreous degeneration, unspecified eye: Secondary | ICD-10-CM | POA: Diagnosis not present

## 2011-06-17 DIAGNOSIS — H353290 Exudative age-related macular degeneration, unspecified eye, stage unspecified: Secondary | ICD-10-CM | POA: Diagnosis not present

## 2011-06-17 DIAGNOSIS — H35379 Puckering of macula, unspecified eye: Secondary | ICD-10-CM | POA: Diagnosis not present

## 2011-06-17 DIAGNOSIS — H43829 Vitreomacular adhesion, unspecified eye: Secondary | ICD-10-CM | POA: Diagnosis not present

## 2011-06-17 DIAGNOSIS — Z79899 Other long term (current) drug therapy: Secondary | ICD-10-CM | POA: Diagnosis not present

## 2011-06-17 DIAGNOSIS — H3581 Retinal edema: Secondary | ICD-10-CM | POA: Diagnosis not present

## 2011-06-17 DIAGNOSIS — H3340 Traction detachment of retina, unspecified eye: Secondary | ICD-10-CM | POA: Diagnosis not present

## 2011-06-23 DIAGNOSIS — Z79899 Other long term (current) drug therapy: Secondary | ICD-10-CM | POA: Diagnosis not present

## 2011-06-23 DIAGNOSIS — C44319 Basal cell carcinoma of skin of other parts of face: Secondary | ICD-10-CM | POA: Diagnosis not present

## 2011-06-23 DIAGNOSIS — C9140 Hairy cell leukemia not having achieved remission: Secondary | ICD-10-CM | POA: Diagnosis not present

## 2011-06-26 DIAGNOSIS — C9140 Hairy cell leukemia not having achieved remission: Secondary | ICD-10-CM | POA: Diagnosis not present

## 2011-06-26 DIAGNOSIS — R222 Localized swelling, mass and lump, trunk: Secondary | ICD-10-CM | POA: Diagnosis not present

## 2011-06-26 DIAGNOSIS — Z79899 Other long term (current) drug therapy: Secondary | ICD-10-CM | POA: Diagnosis not present

## 2011-06-26 DIAGNOSIS — D481 Neoplasm of uncertain behavior of connective and other soft tissue: Secondary | ICD-10-CM | POA: Diagnosis not present

## 2011-06-27 DIAGNOSIS — D481 Neoplasm of uncertain behavior of connective and other soft tissue: Secondary | ICD-10-CM | POA: Diagnosis not present

## 2011-06-27 DIAGNOSIS — R222 Localized swelling, mass and lump, trunk: Secondary | ICD-10-CM | POA: Diagnosis not present

## 2011-07-02 DIAGNOSIS — D481 Neoplasm of uncertain behavior of connective and other soft tissue: Secondary | ICD-10-CM | POA: Diagnosis not present

## 2011-07-02 DIAGNOSIS — R222 Localized swelling, mass and lump, trunk: Secondary | ICD-10-CM | POA: Diagnosis not present

## 2011-07-02 HISTORY — PX: LIPOMA EXCISION: SHX5283

## 2011-07-15 DIAGNOSIS — C493 Malignant neoplasm of connective and soft tissue of thorax: Secondary | ICD-10-CM | POA: Diagnosis not present

## 2011-07-15 DIAGNOSIS — K219 Gastro-esophageal reflux disease without esophagitis: Secondary | ICD-10-CM | POA: Diagnosis not present

## 2011-07-15 DIAGNOSIS — Z130 Encounter for screening for diseases of the blood and blood-forming organs and certain disorders involving the immune mechanism: Secondary | ICD-10-CM | POA: Diagnosis not present

## 2011-07-15 DIAGNOSIS — H43819 Vitreous degeneration, unspecified eye: Secondary | ICD-10-CM | POA: Diagnosis not present

## 2011-07-15 DIAGNOSIS — H353290 Exudative age-related macular degeneration, unspecified eye, stage unspecified: Secondary | ICD-10-CM | POA: Diagnosis not present

## 2011-07-15 DIAGNOSIS — H35379 Puckering of macula, unspecified eye: Secondary | ICD-10-CM | POA: Diagnosis not present

## 2011-07-15 DIAGNOSIS — H3340 Traction detachment of retina, unspecified eye: Secondary | ICD-10-CM | POA: Diagnosis not present

## 2011-07-15 DIAGNOSIS — I10 Essential (primary) hypertension: Secondary | ICD-10-CM | POA: Diagnosis not present

## 2011-07-15 DIAGNOSIS — Z01818 Encounter for other preprocedural examination: Secondary | ICD-10-CM | POA: Diagnosis not present

## 2011-07-18 DIAGNOSIS — K219 Gastro-esophageal reflux disease without esophagitis: Secondary | ICD-10-CM | POA: Diagnosis not present

## 2011-07-18 DIAGNOSIS — C493 Malignant neoplasm of connective and soft tissue of thorax: Secondary | ICD-10-CM | POA: Diagnosis not present

## 2011-07-18 DIAGNOSIS — F411 Generalized anxiety disorder: Secondary | ICD-10-CM | POA: Diagnosis not present

## 2011-07-18 DIAGNOSIS — Z79899 Other long term (current) drug therapy: Secondary | ICD-10-CM | POA: Diagnosis not present

## 2011-07-18 DIAGNOSIS — N400 Enlarged prostate without lower urinary tract symptoms: Secondary | ICD-10-CM | POA: Diagnosis not present

## 2011-07-18 DIAGNOSIS — Z7982 Long term (current) use of aspirin: Secondary | ICD-10-CM | POA: Diagnosis not present

## 2011-07-18 DIAGNOSIS — F329 Major depressive disorder, single episode, unspecified: Secondary | ICD-10-CM | POA: Diagnosis not present

## 2011-07-18 DIAGNOSIS — I452 Bifascicular block: Secondary | ICD-10-CM | POA: Diagnosis not present

## 2011-07-18 DIAGNOSIS — I10 Essential (primary) hypertension: Secondary | ICD-10-CM | POA: Diagnosis not present

## 2011-07-19 DIAGNOSIS — I10 Essential (primary) hypertension: Secondary | ICD-10-CM | POA: Diagnosis not present

## 2011-07-19 DIAGNOSIS — F411 Generalized anxiety disorder: Secondary | ICD-10-CM | POA: Diagnosis not present

## 2011-07-19 DIAGNOSIS — N400 Enlarged prostate without lower urinary tract symptoms: Secondary | ICD-10-CM | POA: Diagnosis not present

## 2011-07-19 DIAGNOSIS — F329 Major depressive disorder, single episode, unspecified: Secondary | ICD-10-CM | POA: Diagnosis not present

## 2011-07-19 DIAGNOSIS — K219 Gastro-esophageal reflux disease without esophagitis: Secondary | ICD-10-CM | POA: Diagnosis not present

## 2011-07-19 DIAGNOSIS — C493 Malignant neoplasm of connective and soft tissue of thorax: Secondary | ICD-10-CM | POA: Diagnosis not present

## 2011-07-23 DIAGNOSIS — I10 Essential (primary) hypertension: Secondary | ICD-10-CM | POA: Diagnosis not present

## 2011-07-24 DIAGNOSIS — I10 Essential (primary) hypertension: Secondary | ICD-10-CM | POA: Diagnosis not present

## 2011-07-24 DIAGNOSIS — Z125 Encounter for screening for malignant neoplasm of prostate: Secondary | ICD-10-CM | POA: Diagnosis not present

## 2011-07-24 DIAGNOSIS — E785 Hyperlipidemia, unspecified: Secondary | ICD-10-CM | POA: Diagnosis not present

## 2011-07-30 DIAGNOSIS — I10 Essential (primary) hypertension: Secondary | ICD-10-CM | POA: Diagnosis not present

## 2011-07-30 DIAGNOSIS — F329 Major depressive disorder, single episode, unspecified: Secondary | ICD-10-CM | POA: Diagnosis not present

## 2011-07-30 DIAGNOSIS — R972 Elevated prostate specific antigen [PSA]: Secondary | ICD-10-CM | POA: Diagnosis not present

## 2011-07-30 DIAGNOSIS — Z0000 Encounter for general adult medical examination without abnormal findings: Secondary | ICD-10-CM | POA: Diagnosis not present

## 2011-07-31 DIAGNOSIS — K219 Gastro-esophageal reflux disease without esophagitis: Secondary | ICD-10-CM | POA: Diagnosis not present

## 2011-07-31 DIAGNOSIS — I452 Bifascicular block: Secondary | ICD-10-CM | POA: Diagnosis not present

## 2011-07-31 DIAGNOSIS — F411 Generalized anxiety disorder: Secondary | ICD-10-CM | POA: Diagnosis not present

## 2011-07-31 DIAGNOSIS — N400 Enlarged prostate without lower urinary tract symptoms: Secondary | ICD-10-CM | POA: Diagnosis not present

## 2011-07-31 DIAGNOSIS — C493 Malignant neoplasm of connective and soft tissue of thorax: Secondary | ICD-10-CM | POA: Diagnosis not present

## 2011-07-31 DIAGNOSIS — F329 Major depressive disorder, single episode, unspecified: Secondary | ICD-10-CM | POA: Diagnosis not present

## 2011-07-31 DIAGNOSIS — Z7982 Long term (current) use of aspirin: Secondary | ICD-10-CM | POA: Diagnosis not present

## 2011-07-31 DIAGNOSIS — I10 Essential (primary) hypertension: Secondary | ICD-10-CM | POA: Diagnosis not present

## 2011-07-31 DIAGNOSIS — Z79899 Other long term (current) drug therapy: Secondary | ICD-10-CM | POA: Diagnosis not present

## 2011-08-01 DIAGNOSIS — H52209 Unspecified astigmatism, unspecified eye: Secondary | ICD-10-CM | POA: Diagnosis not present

## 2011-08-01 DIAGNOSIS — H401490 Capsular glaucoma with pseudoexfoliation of lens, unspecified eye, stage unspecified: Secondary | ICD-10-CM | POA: Diagnosis not present

## 2011-08-01 DIAGNOSIS — H3530 Unspecified macular degeneration: Secondary | ICD-10-CM | POA: Diagnosis not present

## 2011-08-01 DIAGNOSIS — H409 Unspecified glaucoma: Secondary | ICD-10-CM | POA: Diagnosis not present

## 2011-08-04 DIAGNOSIS — Z1212 Encounter for screening for malignant neoplasm of rectum: Secondary | ICD-10-CM | POA: Diagnosis not present

## 2011-08-12 DIAGNOSIS — H353290 Exudative age-related macular degeneration, unspecified eye, stage unspecified: Secondary | ICD-10-CM | POA: Diagnosis not present

## 2011-08-12 DIAGNOSIS — H3340 Traction detachment of retina, unspecified eye: Secondary | ICD-10-CM | POA: Diagnosis not present

## 2011-08-12 DIAGNOSIS — H35379 Puckering of macula, unspecified eye: Secondary | ICD-10-CM | POA: Diagnosis not present

## 2011-08-12 DIAGNOSIS — H43819 Vitreous degeneration, unspecified eye: Secondary | ICD-10-CM | POA: Diagnosis not present

## 2011-08-13 DIAGNOSIS — M949 Disorder of cartilage, unspecified: Secondary | ICD-10-CM | POA: Diagnosis not present

## 2011-08-13 DIAGNOSIS — M899 Disorder of bone, unspecified: Secondary | ICD-10-CM | POA: Diagnosis not present

## 2011-09-10 DIAGNOSIS — C493 Malignant neoplasm of connective and soft tissue of thorax: Secondary | ICD-10-CM | POA: Diagnosis not present

## 2011-09-12 DIAGNOSIS — C499 Malignant neoplasm of connective and soft tissue, unspecified: Secondary | ICD-10-CM | POA: Insufficient documentation

## 2011-09-12 DIAGNOSIS — C189 Malignant neoplasm of colon, unspecified: Secondary | ICD-10-CM | POA: Insufficient documentation

## 2011-09-12 DIAGNOSIS — R972 Elevated prostate specific antigen [PSA]: Secondary | ICD-10-CM | POA: Insufficient documentation

## 2011-09-15 DIAGNOSIS — Z79899 Other long term (current) drug therapy: Secondary | ICD-10-CM | POA: Diagnosis not present

## 2011-09-15 DIAGNOSIS — C9140 Hairy cell leukemia not having achieved remission: Secondary | ICD-10-CM | POA: Diagnosis not present

## 2011-09-16 DIAGNOSIS — H353290 Exudative age-related macular degeneration, unspecified eye, stage unspecified: Secondary | ICD-10-CM | POA: Diagnosis not present

## 2011-09-16 DIAGNOSIS — H35379 Puckering of macula, unspecified eye: Secondary | ICD-10-CM | POA: Diagnosis not present

## 2011-09-16 DIAGNOSIS — H3340 Traction detachment of retina, unspecified eye: Secondary | ICD-10-CM | POA: Diagnosis not present

## 2011-09-16 DIAGNOSIS — H43819 Vitreous degeneration, unspecified eye: Secondary | ICD-10-CM | POA: Diagnosis not present

## 2011-10-14 DIAGNOSIS — D485 Neoplasm of uncertain behavior of skin: Secondary | ICD-10-CM | POA: Diagnosis not present

## 2011-10-14 DIAGNOSIS — B079 Viral wart, unspecified: Secondary | ICD-10-CM | POA: Diagnosis not present

## 2011-10-14 DIAGNOSIS — Z85828 Personal history of other malignant neoplasm of skin: Secondary | ICD-10-CM | POA: Insufficient documentation

## 2011-10-14 DIAGNOSIS — L259 Unspecified contact dermatitis, unspecified cause: Secondary | ICD-10-CM | POA: Diagnosis not present

## 2011-10-14 DIAGNOSIS — L219 Seborrheic dermatitis, unspecified: Secondary | ICD-10-CM | POA: Diagnosis not present

## 2011-10-14 DIAGNOSIS — Z79899 Other long term (current) drug therapy: Secondary | ICD-10-CM | POA: Diagnosis not present

## 2011-10-14 DIAGNOSIS — H353290 Exudative age-related macular degeneration, unspecified eye, stage unspecified: Secondary | ICD-10-CM | POA: Diagnosis not present

## 2011-10-14 DIAGNOSIS — H3340 Traction detachment of retina, unspecified eye: Secondary | ICD-10-CM | POA: Diagnosis not present

## 2011-10-14 DIAGNOSIS — L820 Inflamed seborrheic keratosis: Secondary | ICD-10-CM | POA: Diagnosis not present

## 2011-10-14 DIAGNOSIS — H43819 Vitreous degeneration, unspecified eye: Secondary | ICD-10-CM | POA: Diagnosis not present

## 2011-10-14 DIAGNOSIS — H35379 Puckering of macula, unspecified eye: Secondary | ICD-10-CM | POA: Diagnosis not present

## 2011-10-14 DIAGNOSIS — L570 Actinic keratosis: Secondary | ICD-10-CM | POA: Diagnosis not present

## 2011-10-22 DIAGNOSIS — C493 Malignant neoplasm of connective and soft tissue of thorax: Secondary | ICD-10-CM | POA: Diagnosis not present

## 2011-10-22 DIAGNOSIS — R911 Solitary pulmonary nodule: Secondary | ICD-10-CM | POA: Diagnosis not present

## 2011-10-22 DIAGNOSIS — C499 Malignant neoplasm of connective and soft tissue, unspecified: Secondary | ICD-10-CM | POA: Diagnosis not present

## 2011-11-25 DIAGNOSIS — H43819 Vitreous degeneration, unspecified eye: Secondary | ICD-10-CM | POA: Diagnosis not present

## 2011-11-25 DIAGNOSIS — H35379 Puckering of macula, unspecified eye: Secondary | ICD-10-CM | POA: Diagnosis not present

## 2011-11-25 DIAGNOSIS — H3340 Traction detachment of retina, unspecified eye: Secondary | ICD-10-CM | POA: Diagnosis not present

## 2011-11-25 DIAGNOSIS — H353290 Exudative age-related macular degeneration, unspecified eye, stage unspecified: Secondary | ICD-10-CM | POA: Diagnosis not present

## 2011-11-27 DIAGNOSIS — H409 Unspecified glaucoma: Secondary | ICD-10-CM | POA: Insufficient documentation

## 2011-12-19 DIAGNOSIS — Z23 Encounter for immunization: Secondary | ICD-10-CM | POA: Diagnosis not present

## 2011-12-30 DIAGNOSIS — C44319 Basal cell carcinoma of skin of other parts of face: Secondary | ICD-10-CM | POA: Diagnosis not present

## 2012-01-06 DIAGNOSIS — H3340 Traction detachment of retina, unspecified eye: Secondary | ICD-10-CM | POA: Diagnosis not present

## 2012-01-06 DIAGNOSIS — Z961 Presence of intraocular lens: Secondary | ICD-10-CM | POA: Diagnosis not present

## 2012-01-06 DIAGNOSIS — H43819 Vitreous degeneration, unspecified eye: Secondary | ICD-10-CM | POA: Diagnosis not present

## 2012-01-06 DIAGNOSIS — H43829 Vitreomacular adhesion, unspecified eye: Secondary | ICD-10-CM | POA: Diagnosis not present

## 2012-01-06 DIAGNOSIS — H35379 Puckering of macula, unspecified eye: Secondary | ICD-10-CM | POA: Diagnosis not present

## 2012-01-06 DIAGNOSIS — H353290 Exudative age-related macular degeneration, unspecified eye, stage unspecified: Secondary | ICD-10-CM | POA: Diagnosis not present

## 2012-01-19 DIAGNOSIS — C499 Malignant neoplasm of connective and soft tissue, unspecified: Secondary | ICD-10-CM | POA: Diagnosis not present

## 2012-01-19 DIAGNOSIS — C189 Malignant neoplasm of colon, unspecified: Secondary | ICD-10-CM | POA: Diagnosis not present

## 2012-01-19 DIAGNOSIS — R05 Cough: Secondary | ICD-10-CM | POA: Diagnosis not present

## 2012-01-19 DIAGNOSIS — R059 Cough, unspecified: Secondary | ICD-10-CM | POA: Diagnosis not present

## 2012-01-19 DIAGNOSIS — F329 Major depressive disorder, single episode, unspecified: Secondary | ICD-10-CM | POA: Diagnosis not present

## 2012-01-19 DIAGNOSIS — R972 Elevated prostate specific antigen [PSA]: Secondary | ICD-10-CM | POA: Diagnosis not present

## 2012-01-19 DIAGNOSIS — Z85038 Personal history of other malignant neoplasm of large intestine: Secondary | ICD-10-CM | POA: Diagnosis not present

## 2012-01-19 DIAGNOSIS — R911 Solitary pulmonary nodule: Secondary | ICD-10-CM | POA: Diagnosis not present

## 2012-01-19 DIAGNOSIS — C9140 Hairy cell leukemia not having achieved remission: Secondary | ICD-10-CM | POA: Diagnosis not present

## 2012-01-19 DIAGNOSIS — Z7982 Long term (current) use of aspirin: Secondary | ICD-10-CM | POA: Diagnosis not present

## 2012-01-19 DIAGNOSIS — Z79899 Other long term (current) drug therapy: Secondary | ICD-10-CM | POA: Diagnosis not present

## 2012-01-19 DIAGNOSIS — I10 Essential (primary) hypertension: Secondary | ICD-10-CM | POA: Diagnosis not present

## 2012-01-21 DIAGNOSIS — C499 Malignant neoplasm of connective and soft tissue, unspecified: Secondary | ICD-10-CM | POA: Diagnosis not present

## 2012-01-28 DIAGNOSIS — Z961 Presence of intraocular lens: Secondary | ICD-10-CM | POA: Diagnosis not present

## 2012-01-28 DIAGNOSIS — H401490 Capsular glaucoma with pseudoexfoliation of lens, unspecified eye, stage unspecified: Secondary | ICD-10-CM | POA: Diagnosis not present

## 2012-01-28 DIAGNOSIS — H409 Unspecified glaucoma: Secondary | ICD-10-CM | POA: Diagnosis not present

## 2012-02-17 DIAGNOSIS — H35379 Puckering of macula, unspecified eye: Secondary | ICD-10-CM | POA: Diagnosis not present

## 2012-02-17 DIAGNOSIS — Z961 Presence of intraocular lens: Secondary | ICD-10-CM | POA: Diagnosis not present

## 2012-02-17 DIAGNOSIS — H43819 Vitreous degeneration, unspecified eye: Secondary | ICD-10-CM | POA: Diagnosis not present

## 2012-02-17 DIAGNOSIS — H353290 Exudative age-related macular degeneration, unspecified eye, stage unspecified: Secondary | ICD-10-CM | POA: Diagnosis not present

## 2012-03-16 DIAGNOSIS — H353290 Exudative age-related macular degeneration, unspecified eye, stage unspecified: Secondary | ICD-10-CM | POA: Diagnosis not present

## 2012-03-16 DIAGNOSIS — H35379 Puckering of macula, unspecified eye: Secondary | ICD-10-CM | POA: Diagnosis not present

## 2012-03-16 DIAGNOSIS — Z961 Presence of intraocular lens: Secondary | ICD-10-CM | POA: Diagnosis not present

## 2012-03-16 DIAGNOSIS — H43829 Vitreomacular adhesion, unspecified eye: Secondary | ICD-10-CM | POA: Diagnosis not present

## 2012-03-16 DIAGNOSIS — H3340 Traction detachment of retina, unspecified eye: Secondary | ICD-10-CM | POA: Diagnosis not present

## 2012-04-06 DIAGNOSIS — Q188 Other specified congenital malformations of face and neck: Secondary | ICD-10-CM | POA: Diagnosis not present

## 2012-04-06 DIAGNOSIS — H02839 Dermatochalasis of unspecified eye, unspecified eyelid: Secondary | ICD-10-CM | POA: Diagnosis not present

## 2012-04-06 DIAGNOSIS — H02409 Unspecified ptosis of unspecified eyelid: Secondary | ICD-10-CM | POA: Diagnosis not present

## 2012-04-06 DIAGNOSIS — H02039 Senile entropion of unspecified eye, unspecified eyelid: Secondary | ICD-10-CM | POA: Diagnosis not present

## 2012-04-13 DIAGNOSIS — L259 Unspecified contact dermatitis, unspecified cause: Secondary | ICD-10-CM | POA: Diagnosis not present

## 2012-04-13 DIAGNOSIS — Z85828 Personal history of other malignant neoplasm of skin: Secondary | ICD-10-CM | POA: Diagnosis not present

## 2012-04-13 DIAGNOSIS — H353290 Exudative age-related macular degeneration, unspecified eye, stage unspecified: Secondary | ICD-10-CM | POA: Diagnosis not present

## 2012-04-13 DIAGNOSIS — H02139 Senile ectropion of unspecified eye, unspecified eyelid: Secondary | ICD-10-CM | POA: Diagnosis not present

## 2012-04-13 DIAGNOSIS — H35379 Puckering of macula, unspecified eye: Secondary | ICD-10-CM | POA: Diagnosis not present

## 2012-04-13 DIAGNOSIS — Z961 Presence of intraocular lens: Secondary | ICD-10-CM | POA: Diagnosis not present

## 2012-04-20 DIAGNOSIS — H02139 Senile ectropion of unspecified eye, unspecified eyelid: Secondary | ICD-10-CM | POA: Diagnosis not present

## 2012-04-20 DIAGNOSIS — H02029 Mechanical entropion of unspecified eye, unspecified eyelid: Secondary | ICD-10-CM | POA: Diagnosis not present

## 2012-04-20 DIAGNOSIS — C44319 Basal cell carcinoma of skin of other parts of face: Secondary | ICD-10-CM | POA: Diagnosis not present

## 2012-04-21 DIAGNOSIS — C499 Malignant neoplasm of connective and soft tissue, unspecified: Secondary | ICD-10-CM | POA: Diagnosis not present

## 2012-04-21 DIAGNOSIS — C761 Malignant neoplasm of thorax: Secondary | ICD-10-CM | POA: Diagnosis not present

## 2012-04-23 DIAGNOSIS — H903 Sensorineural hearing loss, bilateral: Secondary | ICD-10-CM | POA: Diagnosis not present

## 2012-05-11 DIAGNOSIS — H3340 Traction detachment of retina, unspecified eye: Secondary | ICD-10-CM | POA: Diagnosis not present

## 2012-05-11 DIAGNOSIS — H353290 Exudative age-related macular degeneration, unspecified eye, stage unspecified: Secondary | ICD-10-CM | POA: Diagnosis not present

## 2012-06-15 DIAGNOSIS — H43819 Vitreous degeneration, unspecified eye: Secondary | ICD-10-CM | POA: Diagnosis not present

## 2012-06-15 DIAGNOSIS — H6120 Impacted cerumen, unspecified ear: Secondary | ICD-10-CM | POA: Diagnosis not present

## 2012-06-15 DIAGNOSIS — H353290 Exudative age-related macular degeneration, unspecified eye, stage unspecified: Secondary | ICD-10-CM | POA: Diagnosis not present

## 2012-07-06 DIAGNOSIS — C44319 Basal cell carcinoma of skin of other parts of face: Secondary | ICD-10-CM | POA: Diagnosis not present

## 2012-07-19 DIAGNOSIS — C44599 Other specified malignant neoplasm of skin of other part of trunk: Secondary | ICD-10-CM | POA: Diagnosis not present

## 2012-07-19 DIAGNOSIS — R0609 Other forms of dyspnea: Secondary | ICD-10-CM | POA: Diagnosis not present

## 2012-07-19 DIAGNOSIS — Z5181 Encounter for therapeutic drug level monitoring: Secondary | ICD-10-CM | POA: Diagnosis not present

## 2012-07-19 DIAGNOSIS — R0989 Other specified symptoms and signs involving the circulatory and respiratory systems: Secondary | ICD-10-CM | POA: Diagnosis not present

## 2012-07-19 DIAGNOSIS — C9140 Hairy cell leukemia not having achieved remission: Secondary | ICD-10-CM | POA: Diagnosis not present

## 2012-07-19 DIAGNOSIS — Z79899 Other long term (current) drug therapy: Secondary | ICD-10-CM | POA: Diagnosis not present

## 2012-07-19 DIAGNOSIS — R911 Solitary pulmonary nodule: Secondary | ICD-10-CM | POA: Diagnosis not present

## 2012-07-19 DIAGNOSIS — C499 Malignant neoplasm of connective and soft tissue, unspecified: Secondary | ICD-10-CM | POA: Diagnosis not present

## 2012-07-19 DIAGNOSIS — Z85038 Personal history of other malignant neoplasm of large intestine: Secondary | ICD-10-CM | POA: Diagnosis not present

## 2012-07-19 DIAGNOSIS — R972 Elevated prostate specific antigen [PSA]: Secondary | ICD-10-CM | POA: Diagnosis not present

## 2012-07-20 DIAGNOSIS — H353290 Exudative age-related macular degeneration, unspecified eye, stage unspecified: Secondary | ICD-10-CM | POA: Diagnosis not present

## 2012-07-21 DIAGNOSIS — C499 Malignant neoplasm of connective and soft tissue, unspecified: Secondary | ICD-10-CM | POA: Diagnosis not present

## 2012-07-21 DIAGNOSIS — R911 Solitary pulmonary nodule: Secondary | ICD-10-CM | POA: Diagnosis not present

## 2012-07-23 DIAGNOSIS — I10 Essential (primary) hypertension: Secondary | ICD-10-CM | POA: Diagnosis not present

## 2012-07-23 DIAGNOSIS — E785 Hyperlipidemia, unspecified: Secondary | ICD-10-CM | POA: Diagnosis not present

## 2012-07-23 DIAGNOSIS — Z125 Encounter for screening for malignant neoplasm of prostate: Secondary | ICD-10-CM | POA: Diagnosis not present

## 2012-08-02 DIAGNOSIS — I10 Essential (primary) hypertension: Secondary | ICD-10-CM | POA: Diagnosis not present

## 2012-08-02 DIAGNOSIS — F329 Major depressive disorder, single episode, unspecified: Secondary | ICD-10-CM | POA: Diagnosis not present

## 2012-08-02 DIAGNOSIS — R972 Elevated prostate specific antigen [PSA]: Secondary | ICD-10-CM | POA: Diagnosis not present

## 2012-08-02 DIAGNOSIS — Z0000 Encounter for general adult medical examination without abnormal findings: Secondary | ICD-10-CM | POA: Diagnosis not present

## 2012-08-02 DIAGNOSIS — Z6830 Body mass index (BMI) 30.0-30.9, adult: Secondary | ICD-10-CM | POA: Diagnosis not present

## 2012-08-02 DIAGNOSIS — R0989 Other specified symptoms and signs involving the circulatory and respiratory systems: Secondary | ICD-10-CM | POA: Diagnosis not present

## 2012-08-02 DIAGNOSIS — Z1331 Encounter for screening for depression: Secondary | ICD-10-CM | POA: Diagnosis not present

## 2012-08-02 DIAGNOSIS — E785 Hyperlipidemia, unspecified: Secondary | ICD-10-CM | POA: Diagnosis not present

## 2012-08-02 DIAGNOSIS — R0609 Other forms of dyspnea: Secondary | ICD-10-CM | POA: Diagnosis not present

## 2012-08-04 DIAGNOSIS — Z1212 Encounter for screening for malignant neoplasm of rectum: Secondary | ICD-10-CM | POA: Diagnosis not present

## 2012-08-17 DIAGNOSIS — H353290 Exudative age-related macular degeneration, unspecified eye, stage unspecified: Secondary | ICD-10-CM | POA: Diagnosis not present

## 2012-08-24 DIAGNOSIS — S51809A Unspecified open wound of unspecified forearm, initial encounter: Secondary | ICD-10-CM | POA: Diagnosis not present

## 2012-09-04 DIAGNOSIS — Z4802 Encounter for removal of sutures: Secondary | ICD-10-CM | POA: Diagnosis not present

## 2012-09-04 DIAGNOSIS — S51809A Unspecified open wound of unspecified forearm, initial encounter: Secondary | ICD-10-CM | POA: Diagnosis not present

## 2012-09-21 DIAGNOSIS — H353190 Nonexudative age-related macular degeneration, unspecified eye, stage unspecified: Secondary | ICD-10-CM | POA: Diagnosis not present

## 2012-09-21 DIAGNOSIS — H35379 Puckering of macula, unspecified eye: Secondary | ICD-10-CM | POA: Diagnosis not present

## 2012-09-21 DIAGNOSIS — H353290 Exudative age-related macular degeneration, unspecified eye, stage unspecified: Secondary | ICD-10-CM | POA: Diagnosis not present

## 2012-09-28 DIAGNOSIS — H353290 Exudative age-related macular degeneration, unspecified eye, stage unspecified: Secondary | ICD-10-CM | POA: Diagnosis not present

## 2012-10-19 DIAGNOSIS — Z85828 Personal history of other malignant neoplasm of skin: Secondary | ICD-10-CM | POA: Diagnosis not present

## 2012-10-19 DIAGNOSIS — L570 Actinic keratosis: Secondary | ICD-10-CM | POA: Diagnosis not present

## 2012-10-19 DIAGNOSIS — L259 Unspecified contact dermatitis, unspecified cause: Secondary | ICD-10-CM | POA: Diagnosis not present

## 2012-10-19 DIAGNOSIS — H35379 Puckering of macula, unspecified eye: Secondary | ICD-10-CM | POA: Diagnosis not present

## 2012-10-19 DIAGNOSIS — H35419 Lattice degeneration of retina, unspecified eye: Secondary | ICD-10-CM | POA: Diagnosis not present

## 2012-10-19 DIAGNOSIS — H353290 Exudative age-related macular degeneration, unspecified eye, stage unspecified: Secondary | ICD-10-CM | POA: Diagnosis not present

## 2012-10-19 DIAGNOSIS — L821 Other seborrheic keratosis: Secondary | ICD-10-CM | POA: Diagnosis not present

## 2012-10-19 DIAGNOSIS — H353190 Nonexudative age-related macular degeneration, unspecified eye, stage unspecified: Secondary | ICD-10-CM | POA: Diagnosis not present

## 2012-10-20 DIAGNOSIS — C499 Malignant neoplasm of connective and soft tissue, unspecified: Secondary | ICD-10-CM | POA: Diagnosis not present

## 2012-10-20 DIAGNOSIS — R911 Solitary pulmonary nodule: Secondary | ICD-10-CM | POA: Diagnosis not present

## 2012-10-21 DIAGNOSIS — M76899 Other specified enthesopathies of unspecified lower limb, excluding foot: Secondary | ICD-10-CM | POA: Diagnosis not present

## 2012-10-21 DIAGNOSIS — M5137 Other intervertebral disc degeneration, lumbosacral region: Secondary | ICD-10-CM | POA: Diagnosis not present

## 2012-10-21 DIAGNOSIS — M25559 Pain in unspecified hip: Secondary | ICD-10-CM | POA: Diagnosis not present

## 2012-10-29 DIAGNOSIS — H1130 Conjunctival hemorrhage, unspecified eye: Secondary | ICD-10-CM | POA: Diagnosis not present

## 2012-11-16 DIAGNOSIS — H353190 Nonexudative age-related macular degeneration, unspecified eye, stage unspecified: Secondary | ICD-10-CM | POA: Diagnosis not present

## 2012-11-16 DIAGNOSIS — H353290 Exudative age-related macular degeneration, unspecified eye, stage unspecified: Secondary | ICD-10-CM | POA: Diagnosis not present

## 2012-11-16 DIAGNOSIS — H35419 Lattice degeneration of retina, unspecified eye: Secondary | ICD-10-CM | POA: Diagnosis not present

## 2012-11-19 DIAGNOSIS — Z6830 Body mass index (BMI) 30.0-30.9, adult: Secondary | ICD-10-CM | POA: Diagnosis not present

## 2012-11-19 DIAGNOSIS — H8110 Benign paroxysmal vertigo, unspecified ear: Secondary | ICD-10-CM | POA: Diagnosis not present

## 2012-11-19 DIAGNOSIS — I10 Essential (primary) hypertension: Secondary | ICD-10-CM | POA: Diagnosis not present

## 2012-12-01 DIAGNOSIS — M76899 Other specified enthesopathies of unspecified lower limb, excluding foot: Secondary | ICD-10-CM | POA: Diagnosis not present

## 2012-12-01 DIAGNOSIS — M25559 Pain in unspecified hip: Secondary | ICD-10-CM | POA: Diagnosis not present

## 2012-12-14 DIAGNOSIS — H35419 Lattice degeneration of retina, unspecified eye: Secondary | ICD-10-CM | POA: Diagnosis not present

## 2012-12-14 DIAGNOSIS — H353290 Exudative age-related macular degeneration, unspecified eye, stage unspecified: Secondary | ICD-10-CM | POA: Diagnosis not present

## 2012-12-14 DIAGNOSIS — H43829 Vitreomacular adhesion, unspecified eye: Secondary | ICD-10-CM | POA: Diagnosis not present

## 2012-12-14 DIAGNOSIS — H353190 Nonexudative age-related macular degeneration, unspecified eye, stage unspecified: Secondary | ICD-10-CM | POA: Diagnosis not present

## 2012-12-20 DIAGNOSIS — Z09 Encounter for follow-up examination after completed treatment for conditions other than malignant neoplasm: Secondary | ICD-10-CM | POA: Diagnosis not present

## 2012-12-20 DIAGNOSIS — Z23 Encounter for immunization: Secondary | ICD-10-CM | POA: Diagnosis not present

## 2012-12-20 DIAGNOSIS — C9140 Hairy cell leukemia not having achieved remission: Secondary | ICD-10-CM | POA: Diagnosis not present

## 2012-12-20 DIAGNOSIS — C499 Malignant neoplasm of connective and soft tissue, unspecified: Secondary | ICD-10-CM | POA: Diagnosis not present

## 2012-12-20 DIAGNOSIS — R42 Dizziness and giddiness: Secondary | ICD-10-CM | POA: Diagnosis not present

## 2012-12-20 DIAGNOSIS — C189 Malignant neoplasm of colon, unspecified: Secondary | ICD-10-CM | POA: Diagnosis not present

## 2012-12-20 DIAGNOSIS — Z85038 Personal history of other malignant neoplasm of large intestine: Secondary | ICD-10-CM | POA: Diagnosis not present

## 2012-12-20 DIAGNOSIS — R911 Solitary pulmonary nodule: Secondary | ICD-10-CM | POA: Diagnosis not present

## 2012-12-20 DIAGNOSIS — Z79899 Other long term (current) drug therapy: Secondary | ICD-10-CM | POA: Diagnosis not present

## 2012-12-20 DIAGNOSIS — Z8589 Personal history of malignant neoplasm of other organs and systems: Secondary | ICD-10-CM | POA: Diagnosis not present

## 2012-12-20 DIAGNOSIS — R0609 Other forms of dyspnea: Secondary | ICD-10-CM | POA: Diagnosis not present

## 2012-12-20 DIAGNOSIS — R972 Elevated prostate specific antigen [PSA]: Secondary | ICD-10-CM | POA: Diagnosis not present

## 2012-12-20 DIAGNOSIS — Z856 Personal history of leukemia: Secondary | ICD-10-CM | POA: Diagnosis not present

## 2013-01-03 ENCOUNTER — Encounter: Payer: Self-pay | Admitting: Cardiology

## 2013-01-03 ENCOUNTER — Ambulatory Visit (INDEPENDENT_AMBULATORY_CARE_PROVIDER_SITE_OTHER): Payer: Medicare Other | Admitting: Cardiology

## 2013-01-03 VITALS — BP 134/82 | Ht 66.0 in | Wt 180.0 lb

## 2013-01-03 DIAGNOSIS — R0609 Other forms of dyspnea: Secondary | ICD-10-CM | POA: Diagnosis not present

## 2013-01-03 DIAGNOSIS — I119 Hypertensive heart disease without heart failure: Secondary | ICD-10-CM | POA: Insufficient documentation

## 2013-01-03 DIAGNOSIS — C9140 Hairy cell leukemia not having achieved remission: Secondary | ICD-10-CM | POA: Diagnosis not present

## 2013-01-03 DIAGNOSIS — R0600 Dyspnea, unspecified: Secondary | ICD-10-CM

## 2013-01-03 DIAGNOSIS — R079 Chest pain, unspecified: Secondary | ICD-10-CM | POA: Diagnosis not present

## 2013-01-03 DIAGNOSIS — R0989 Other specified symptoms and signs involving the circulatory and respiratory systems: Secondary | ICD-10-CM

## 2013-01-03 DIAGNOSIS — R0689 Other abnormalities of breathing: Secondary | ICD-10-CM

## 2013-01-03 DIAGNOSIS — R0602 Shortness of breath: Secondary | ICD-10-CM | POA: Insufficient documentation

## 2013-01-03 NOTE — Progress Notes (Signed)
[name] Date of Birth:  Feb 17, 1924 [address] [place], [place]  [address] [phone]         Fax   [phone]  History of Present Illness: This 79 year old married Caucasian gentleman is seen after a long absence.  He is a medical patient of Dr. Jarome Matin.  He has a complex past medical history.  He has a history of hairy cell leukemia initially diagnosed in 1980 by Dr. Pollyann Kennedy and subsequently followed at [facility].  He has had splenectomy.  He has a known pulmonary nodule which has been stable in which has been followed at [facility].  He has a history of colon cancer in 1984.  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  In 2008 she developed a staph infection of his spine and right hand and required 7 weeks of IV antibiotics with a PICC line under the care of Dr. Orvan Falconer and Dr. Maurice March.  In 2009 he had appendicitis.  He has a history of prostate problems and his most recent PSA has been greater than 20 and he has an appointment to see a urologist at [facility] later this week on November 6. His cardiac history reveals that in 2008 he had an echocardiogram at [facility] showing normal ejection fraction and no significant valve disease.  He has had several prior stress tests at [facility] office in the remote past but none recently. He comes to the office now at the request of Dr. Dossie Arbour because he has been experiencing some recent increase in shortness of breath as well as some occasional chest pain.  He has noted a decrease in stamina.  He had a recent CBC at [facility] 2 weeks ago which did not show any evidence of anemia or other acute change.  He is concerned about his heart although he thinks that his chest pain may be related to self diagnosed reflux.  He has been taking Prilosec on a when necessary basis on his own.  Current Outpatient Prescriptions  Medication Sig Dispense Refill  . aspirin 81 MG  tablet Take 81 mg by mouth daily.      . calcitonin, salmon, (MIACALCIN/FORTICAL) 200 UNIT/ACT nasal spray Place 1 spray into the nose daily.      . Cholecalciferol (VITAMIN D-3 PO) Take by mouth daily.      Marland Kitchen dutasteride (AVODART) 0.5 MG capsule Take 0.5 mg by mouth daily.      Marland Kitchen LATANOPROST OP Apply to eye.      . Multiple Vitamins-Minerals (CENTRUM SILVER ADULT 50+ PO) Take by mouth daily.      . Multiple Vitamins-Minerals (PRESERVISION AREDS PO) Take by mouth daily.      Marland Kitchen omega-3 fish oil (MAXEPA) 1000 MG CAPS capsule Take by mouth daily.      . Omeprazole (PRILOSEC PO) Take by mouth daily.      . sertraline (ZOLOFT) 50 MG tablet Take 50 mg by mouth daily.      . tamsulosin (FLOMAX) 0.4 MG CAPS capsule Take 0.4 mg by mouth daily after supper.      . timolol (BETIMOL) 0.5 % ophthalmic solution 1 drop 2 (two) times daily.      Marland Kitchen triamterene-hydrochlorothiazide (DYAZIDE) 37.5-25 MG per capsule Take 1 capsule by mouth every morning.       No current facility-administered medications for this visit.    No Known Allergies  Patient Active Problem List  Diagnosis Date Noted  . INFECTION, STAPHYLOCOCCUS AUREUS 11/30/2006    History  Smoking status  . Never Smoker   Smokeless tobacco  . Not on file    History  Alcohol Use  . Yes    No family history on file.  Review of Systems: Constitutional: no fever chills diaphoresis or fatigue or change in weight.  Head and neck: no hearing loss, no epistaxis, no photophobia or visual disturbance. Respiratory: No cough, or wheezing.  Positive for exertional dyspnea Cardiovascular: No  peripheral edema, palpitations.  Positive for chest pain in the substernal area Gastrointestinal: No abdominal distention, no abdominal pain, no change in bowel habits hematochezia or melena. Genitourinary: No dysuria, no frequency, no urgency, no nocturia.  Recent PSA was greater than 20 Musculoskeletal:No arthralgias, no back pain, no gait disturbance or  myalgias. Neurological: No dizziness, no headaches, no numbness, no seizures, no syncope, no weakness, no tremors. Hematologic: No lymphadenopathy, no easy bruising. Psychiatric: No confusion, no hallucinations, no sleep disturbance.    Physical Exam: Filed Vitals:   01/03/13 1621  BP: 134/82   the general appearance reveals a well-developed well-nourished elderly gentleman in no distress.The head and neck exam reveals pupils equal and reactive.  Extraocular movements are full.  There is no scleral icterus.  The mouth and pharynx are normal.  The neck is supple.  The carotids reveal no bruits.  The jugular venous pressure is normal.  The  thyroid is not enlarged.  There is no lymphadenopathy.  The chest is clear to percussion and auscultation.  There are no rales or rhonchi.  Expansion of the chest is symmetrical.  The precordium is quiet.  The first heart sound is normal.  The second heart sound is physiologically split.  There is no murmur gallop rub or click.  There is no abnormal lift or heave.  The abdomen is soft and nontender.  The bowel sounds are normal.  The liver and spleen are not enlarged.  There are no abdominal masses.  There are no abdominal bruits.  Extremities reveal good pedal pulses.  There is no phlebitis or edema.  There is no cyanosis or clubbing.  Strength is normal and symmetrical in all extremities.  There is no lateralizing weakness.  There are no sensory deficits.  The skin is warm and dry.  There is no rash.  EKG today shows sinus rhythm with bifascicular block.  No old tracings to compare  Assessment / Plan: Exertional chest discomfort and dyspnea of uncertain etiology.  We will have the patient return soon for a walking Lexa scan Myoview stress test and a two-dimensional echocardiogram. Bifascicular block, asymptomatic  Many thanks for the opportunity to see this pleasant gentleman with you and I will be in touch with you regarding the results of his cardiac  studies.

## 2013-01-03 NOTE — Patient Instructions (Signed)
Your physician recommends that you continue on your current medications as directed. Please refer to the Current Medication list given to you today.  Your physician has requested that you have an echocardiogram. Echocardiography is a painless test that uses sound waves to create images of your heart. It provides your doctor with information about the size and shape of your heart and how well your heart's chambers and valves are working. This procedure takes approximately one hour. There are no restrictions for this procedure.  Your physician has requested that you have a lexiscan myoview. For further information please visit [email or web address] Please follow instruction sheet, as given.  Your physician wants you to follow-up in: 6 month ov You will receive a reminder letter in the mail two months in advance. If you don't receive a letter, please call our office to schedule the follow-up appointment.

## 2013-01-06 DIAGNOSIS — R972 Elevated prostate specific antigen [PSA]: Secondary | ICD-10-CM | POA: Diagnosis not present

## 2013-01-11 DIAGNOSIS — H353290 Exudative age-related macular degeneration, unspecified eye, stage unspecified: Secondary | ICD-10-CM | POA: Diagnosis not present

## 2013-01-11 DIAGNOSIS — H43829 Vitreomacular adhesion, unspecified eye: Secondary | ICD-10-CM | POA: Diagnosis not present

## 2013-01-11 DIAGNOSIS — H35379 Puckering of macula, unspecified eye: Secondary | ICD-10-CM | POA: Diagnosis not present

## 2013-01-11 DIAGNOSIS — H35419 Lattice degeneration of retina, unspecified eye: Secondary | ICD-10-CM | POA: Diagnosis not present

## 2013-01-17 ENCOUNTER — Encounter: Payer: Self-pay | Admitting: Cardiology

## 2013-01-17 ENCOUNTER — Telehealth: Payer: Self-pay | Admitting: *Deleted

## 2013-01-17 ENCOUNTER — Ambulatory Visit (HOSPITAL_COMMUNITY): Payer: Medicare Other | Attending: Cardiology | Admitting: Cardiology

## 2013-01-17 DIAGNOSIS — I10 Essential (primary) hypertension: Secondary | ICD-10-CM | POA: Insufficient documentation

## 2013-01-17 DIAGNOSIS — R0600 Dyspnea, unspecified: Secondary | ICD-10-CM

## 2013-01-17 DIAGNOSIS — R079 Chest pain, unspecified: Secondary | ICD-10-CM | POA: Diagnosis not present

## 2013-01-17 DIAGNOSIS — R0609 Other forms of dyspnea: Secondary | ICD-10-CM | POA: Diagnosis not present

## 2013-01-17 DIAGNOSIS — R0989 Other specified symptoms and signs involving the circulatory and respiratory systems: Secondary | ICD-10-CM

## 2013-01-17 DIAGNOSIS — I359 Nonrheumatic aortic valve disorder, unspecified: Secondary | ICD-10-CM | POA: Insufficient documentation

## 2013-01-17 DIAGNOSIS — I379 Nonrheumatic pulmonary valve disorder, unspecified: Secondary | ICD-10-CM | POA: Insufficient documentation

## 2013-01-17 DIAGNOSIS — I079 Rheumatic tricuspid valve disease, unspecified: Secondary | ICD-10-CM | POA: Insufficient documentation

## 2013-01-17 NOTE — Telephone Encounter (Signed)
Pt will call back with number for Dr Rowe Pavy to send his echo and stress test once completed.

## 2013-01-17 NOTE — Progress Notes (Signed)
Echo performed. 

## 2013-01-20 ENCOUNTER — Encounter: Payer: Self-pay | Admitting: Cardiovascular Disease

## 2013-01-20 ENCOUNTER — Ambulatory Visit (HOSPITAL_COMMUNITY): Payer: Medicare Other | Attending: Cardiovascular Disease | Admitting: Radiology

## 2013-01-20 VITALS — BP 148/91 | HR 67 | Ht 66.0 in | Wt 174.0 lb

## 2013-01-20 DIAGNOSIS — R0602 Shortness of breath: Secondary | ICD-10-CM

## 2013-01-20 DIAGNOSIS — R0609 Other forms of dyspnea: Secondary | ICD-10-CM | POA: Insufficient documentation

## 2013-01-20 DIAGNOSIS — Z8249 Family history of ischemic heart disease and other diseases of the circulatory system: Secondary | ICD-10-CM | POA: Insufficient documentation

## 2013-01-20 DIAGNOSIS — R079 Chest pain, unspecified: Secondary | ICD-10-CM | POA: Insufficient documentation

## 2013-01-20 DIAGNOSIS — E785 Hyperlipidemia, unspecified: Secondary | ICD-10-CM | POA: Insufficient documentation

## 2013-01-20 DIAGNOSIS — R0600 Dyspnea, unspecified: Secondary | ICD-10-CM

## 2013-01-20 DIAGNOSIS — I4510 Unspecified right bundle-branch block: Secondary | ICD-10-CM | POA: Diagnosis not present

## 2013-01-20 DIAGNOSIS — I10 Essential (primary) hypertension: Secondary | ICD-10-CM | POA: Insufficient documentation

## 2013-01-20 DIAGNOSIS — R0989 Other specified symptoms and signs involving the circulatory and respiratory systems: Secondary | ICD-10-CM | POA: Insufficient documentation

## 2013-01-20 MED ORDER — TECHNETIUM TC 99M SESTAMIBI GENERIC - CARDIOLITE
33.0000 | Freq: Once | INTRAVENOUS | Status: AC | PRN
  Administered 2013-01-20: 33 via INTRAVENOUS

## 2013-01-20 MED ORDER — REGADENOSON 0.4 MG/5ML IV SOLN
0.4000 mg | Freq: Once | INTRAVENOUS | Status: AC
  Administered 2013-01-20: 0.4 mg via INTRAVENOUS

## 2013-01-20 MED ORDER — TECHNETIUM TC 99M SESTAMIBI GENERIC - CARDIOLITE
11.0000 | Freq: Once | INTRAVENOUS | Status: AC | PRN
  Administered 2013-01-20: 11 via INTRAVENOUS

## 2013-01-20 NOTE — Progress Notes (Signed)
[facility] SITE 3 NUCLEAR MED [address] [place], [place] [address] [phone]    Cardiology Nuclear Med Study  [name] is a 79 y.o. male     MRN : [id number]     DOB: [birth year]  Procedure Date: 01/20/2013  Nuclear Med Background Indication for Stress Test:  Evaluation for Ischemia History:  Echo 2014 EF 60-65%, MPI ?2008 (normal), No known CAD Cardiac Risk Factors: Family History - CAD, Hypertension, Lipids and RBBB  Symptoms:  Chest Pain and DOE   Nuclear Pre-Procedure Caffeine/Decaff Intake:  None > 12 hrs NPO After: 8:30pm   Lungs:  clear O2 Sat: 92% on room air. IV 0.9% NS with Angio Cath:  22g  IV Site: L Antecubital x 1, tolerated well IV Started by:  Irean Hong, RN  Chest Size (in):  42 Cup Size: n/a  Height: 5\' 6"  (1.676 m)  Weight:  174 lb (78.926 kg)  BMI:  Body mass index is 28.1 kg/(m^2). Tech Comments:  Patient took am medications    Nuclear Med Study 1 or 2 day study: 1 day  Stress Test Type:  Lexiscan  Reading MD: Charlton Haws, MD  Order Authorizing Provider:  Cassell Clement, MD  Resting Radionuclide: Technetium 29m Sestamibi  Resting Radionuclide Dose: 11.0 mCi   Stress Radionuclide:  Technetium 30m Sestamibi  Stress Radionuclide Dose: 33.0 mCi           Stress Protocol Rest HR: 67 Stress HR: 98  Rest BP: 148/91 Stress BP: 113/65  Exercise Time (min): n/a METS: n/a   Predicted Max HR: 134 bpm % Max HR: 73.13 bpm Rate Pressure Product: 27253   Dose of Adenosine (mg):  n/a Dose of Lexiscan: 0.4 mg  Dose of Atropine (mg): n/a Dose of Dobutamine: n/a mcg/kg/min (at max HR)  Stress Test Technologist: Nelson Chimes, BS-ES  Nuclear Technologist:  Dario Guardian, CNMT     Rest Procedure:  Myocardial perfusion imaging was performed at rest 45 minutes following the intravenous administration of Technetium 54m Sestamibi. Rest ECG: NSR-RBBB  Stress Procedure:  The patient received IV Lexiscan 0.4 mg over 15-seconds with  concurrent low level exercise and then Technetium 82m Sestamibi was injected at 30-seconds while the patient continued walking one more minute.  Quantitative spect images were obtained after a 45-minute delay.  During the infusion of Lexiscan, patient complained of SOB.  Symptoms resolved in recovery.  Stress ECG: No significant change from baseline ECG  QPS Raw Data Images:  Normal; no motion artifact; normal heart/lung ratio. Stress Images:  Decreased apical uptake Rest Images:  Normal homogeneous uptake in all areas of the myocardium. Subtraction (SDS):  Small area of apical ischemia Transient Ischemic Dilatation (Normal <1.22):  0.93 Lung/Heart Ratio (Normal <0.45):  0.43  Quantitative Gated Spect Images QGS EDV:  70 ml QGS ESV:  16 ml  Impression Exercise Capacity:  Lexiscan with low level exercise. BP Response:  Normal blood pressure response. Clinical Symptoms:  No significant symptoms noted. ECG Impression:  No significant ST segment change suggestive of ischemia. Comparison with Prior Nuclear Study: No images to compare  Overall Impression:  Low risk stress nuclear study Small area of mixed apical infarct and ischemia.  .  LV Ejection Fraction: 77%.  LV Wall Motion:  NL LV Function; NL Wall Motion or Normal Wall Motion  Charlton Haws

## 2013-01-24 ENCOUNTER — Telehealth: Payer: Self-pay | Admitting: Cardiology

## 2013-01-24 NOTE — Telephone Encounter (Signed)
New message     Got results regarding test--but have questions and want to talk to a nurse.

## 2013-01-24 NOTE — Telephone Encounter (Signed)
Dr. Patty Sermons would like to call him, call patient at home tonight/cell tomorrow.

## 2013-01-24 NOTE — Telephone Encounter (Signed)
I called patient and went over his recent echo and myoview tests. I suggested that he get back in touch with Dr. Eloise Harman to evaluate dyspnea further. Dr. Eloise Harman may have done a recent chest xray?  Consider pulmonary consult?

## 2013-01-24 NOTE — Telephone Encounter (Signed)
Pt states that he has had a test last week// pt has had calls from  Multiple representatives who states that they cannot understand Dr. Yevonne Pax handwriting and that they would call him back (No call back) // In the meantime pt is still having problems// Also requests an activation number for his my chart account// please call back to discuss.

## 2013-01-25 ENCOUNTER — Telehealth: Payer: Self-pay | Admitting: Emergency Medicine

## 2013-01-25 NOTE — Telephone Encounter (Signed)
Spoke to pt. He had some testing done by Dr. Patty Sermons that revealed that the pt might has some issues with his lungs. Per the pt, he is related to Dr. Delton Coombes. He has been scheduled for a consult on 02/11/13 at 3:15pm. Wants to know if Dr. Delton Coombes will work him in sooner.  RB - please advise.

## 2013-01-26 ENCOUNTER — Telehealth: Payer: Self-pay | Admitting: Cardiology

## 2013-01-26 NOTE — Telephone Encounter (Signed)
Received information from patient and will send

## 2013-01-26 NOTE — Telephone Encounter (Signed)
New Problem:  Pt states he would like to speak to [place]. Pt states he will give more details when [place] calls.

## 2013-01-26 NOTE — Telephone Encounter (Signed)
Spoke with patient and requested phone and fax numbers of list of recipients for recent studies. He did not have, email sent to them by me requesting fax and phone numbers for them.

## 2013-01-28 ENCOUNTER — Telehealth: Payer: Self-pay | Admitting: Cardiology

## 2013-01-28 NOTE — Telephone Encounter (Signed)
Spoke with patient and he had questions regarding nuclear and echo studies he received, not sure if he got all the pages. Went over what he received and he did

## 2013-01-28 NOTE — Telephone Encounter (Signed)
New Problem:  Pt is calling wanting to speak to [place]. Pt states he has questions about paperwork he received at his last visit.

## 2013-02-01 NOTE — Telephone Encounter (Signed)
i checked RB schedule.  He out of the office all week.  And is 100% booked all next week.  i have called and lmom for the pt to return the call to make him aware.

## 2013-02-01 NOTE — Telephone Encounter (Signed)
You can check to see, but I'm not sure I have anything sooner.

## 2013-02-02 NOTE — Telephone Encounter (Signed)
Pt advised and will keep appt on 02-11-13. Carron Curie, CMA

## 2013-02-03 ENCOUNTER — Telehealth: Payer: Self-pay | Admitting: Cardiology

## 2013-02-03 NOTE — Telephone Encounter (Signed)
Pt was calling to see if faxes of the echo & nuc med studies had been sent to Dr. Eloise Harman, Primitivo Gauze, & 2 other out of town MDs. Fax sent to Dr. Eloise Harman & Primitivo Gauze. He would like Juliette Alcide to call him back tomorrow about the other 2 doctors. Mylo Red RN

## 2013-02-03 NOTE — Telephone Encounter (Signed)
New message     Calling to see if you have mail the "reports" out to various people.  He would not tell me what report or to who.

## 2013-02-04 NOTE — Telephone Encounter (Signed)
Spoke with wife and advised sent information as requested to Dr Magnus Ivan [phone] ,[phone]) and Dr Primitivo Gauze [phone] as requested

## 2013-02-10 ENCOUNTER — Ambulatory Visit: Payer: Medicare Other | Admitting: Emergency Medicine

## 2013-02-11 ENCOUNTER — Encounter: Payer: Self-pay | Admitting: Emergency Medicine

## 2013-02-11 ENCOUNTER — Ambulatory Visit (INDEPENDENT_AMBULATORY_CARE_PROVIDER_SITE_OTHER): Payer: Medicare Other | Admitting: Emergency Medicine

## 2013-02-11 VITALS — BP 140/92 | HR 72 | Ht 64.5 in | Wt 179.0 lb

## 2013-02-11 DIAGNOSIS — R05 Cough: Secondary | ICD-10-CM

## 2013-02-11 DIAGNOSIS — R0609 Other forms of dyspnea: Secondary | ICD-10-CM | POA: Diagnosis not present

## 2013-02-11 DIAGNOSIS — R0989 Other specified symptoms and signs involving the circulatory and respiratory systems: Secondary | ICD-10-CM

## 2013-02-11 DIAGNOSIS — R059 Cough, unspecified: Secondary | ICD-10-CM | POA: Diagnosis not present

## 2013-02-11 DIAGNOSIS — R0600 Dyspnea, unspecified: Secondary | ICD-10-CM

## 2013-02-11 DIAGNOSIS — R911 Solitary pulmonary nodule: Secondary | ICD-10-CM | POA: Insufficient documentation

## 2013-02-11 NOTE — Assessment & Plan Note (Signed)
Per [facility] reports it grew slightly from '08 to '12, then was stable from '12 to '13. Will discuss timing of repeat CT with him next time.

## 2013-02-11 NOTE — Patient Instructions (Signed)
-   walking oximetry today - repeat pulmonary function testing at this office at your next visit - continue your prilosec daily - we will obtain your prior PFT's from [facility] - follow next available with full PFT same day.

## 2013-02-11 NOTE — Progress Notes (Signed)
Subjective:    Patient ID: [name], male    DOB: [birth year], 79 y.o.   MRN: [id number]  HPI 79 yo man, minimal tobacco hx, hx hairy cell leukemia, colon Ca, GERD, HTN, anemia. He has had cytoxin. He is referred for exertional SOB.  He was seen by Dr Patty Sermons > low risk perfusion scan 11/21, TTE with normal LV fxn and diastolic dysfxn on 11/17. He had BAL 6-7 years ago for a LUL pulm nodule that has been followed at [facility] > iincreased between 08 and 12, then stable to 13 (CT's at [facility]). His SOB has progressed over the last year, occurs with stairs, walking through a large parking lot.  No associated CP, no claudication. He has had a mild cough for 4 months, may have had some wheeze. He had PFT done a few years ago. He has gained 3-4 lbs over last 2 years.     04/21/11 --  PRE POST  Ref Best %Pred Best %Pred %Chg FVC Liters 3.61 2.71 75 FEV1 Liters 2.69 1.92 71 FEV1/FVC % 76 71 94  FEV1/SVC % 67 FEF25-75% L/sec 2.33 1.15 49 PEF L/sec 6.79 7.79 115 MVV L/min 95 89 93 VC Liters 3.61 2.85 79  TLC Liters 6.29 4.80 76 RV Liters 2.55 1.95 76 FRC N2 Liters 3.40 2.75 81 ERV Liters 1.19 0.80 67 IC Liters 2.38 2.05 86  DLCO mL/mmHg/min 21.4 14.5 68 VA Liters 4.11 IVC Liters 2.51 BHT Sec 13.10     Review of Systems  Constitutional: Negative for fever and unexpected weight change.  HENT: Negative for congestion, dental problem, ear pain, nosebleeds, postnasal drip, rhinorrhea, sinus pressure, sneezing, sore throat and trouble swallowing.   Eyes: Negative for redness and itching.  Respiratory: Positive for cough, chest tightness and shortness of breath. Negative for wheezing.   Cardiovascular: Negative for palpitations and leg swelling.  Gastrointestinal: Negative for nausea and vomiting.  Genitourinary: Negative for dysuria.  Musculoskeletal: Negative for joint swelling.  Skin: Negative for rash.  Neurological: Negative for headaches.  Hematological: Does not bruise/bleed  easily.  Psychiatric/Behavioral: Negative for dysphoric mood. The patient is not nervous/anxious.     Past Medical History  Diagnosis Date  . 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 DJD (degenerative joint disease) of lumbar spine   . Osteoarthritis   . Compression fracture of lumbar spine, non-traumatic   . Hairy cell leukemia   . Anemia   . Hyponatremia   . Depression   . Malnutrition   . Hypertension   . GERD (gastroesophageal reflux disease)   . Colon cancer   . CAD (coronary artery disease)   . Elevated PSA   . Glaucoma   . Insomnia   . Acute appendicitis   . Osteoporosis   . Dyslipidemia      No family history on file.   History   Social History  . Marital Status: Married    Spouse Name: N/A    Number of Children: N/A  . Years of Education: N/A   Occupational History  . Not on file.   Social History Main Topics  . Smoking status: Former Smoker -- 0.25 packs/day for 1 years    Types: Cigarettes, Cigars  . Smokeless tobacco: Never Used     Comment: occasional social smoker during college.  . Alcohol Use: 2.5 oz/week    5 drink(s) per week  . Drug Use: No  . Sexual Activity: Not on file   Other Topics Concern  .  Not on file   Social History Narrative  . No narrative on file    Allergies  Allergen Reactions  . Antihistamines, Chlorpheniramine-Type     Nervous, jittery  . Sulfa Antibiotics      Outpatient Prescriptions Prior to Visit  Medication Sig Dispense Refill  . aspirin 81 MG tablet Take 81 mg by mouth daily.      . calcitonin, salmon, (MIACALCIN/FORTICAL) 200 UNIT/ACT nasal spray Place 1 spray into the nose daily.      . Cholecalciferol (VITAMIN D-3 PO) Take by mouth daily.      Marland Kitchen dutasteride (AVODART) 0.5 MG capsule Take 0.5 mg by mouth daily.      Marland Kitchen LATANOPROST OP Apply to eye.      . Multiple Vitamins-Minerals (CENTRUM SILVER ADULT 50+ PO) Take by mouth daily.      . Multiple Vitamins-Minerals (PRESERVISION AREDS  PO) Take by mouth daily.      Marland Kitchen omega-3 fish oil (MAXEPA) 1000 MG CAPS capsule Take by mouth daily.      . Omeprazole (PRILOSEC PO) Take by mouth daily.      . sertraline (ZOLOFT) 50 MG tablet Take 50 mg by mouth daily.      . tamsulosin (FLOMAX) 0.4 MG CAPS capsule Take 0.4 mg by mouth daily after supper.      . timolol (BETIMOL) 0.5 % ophthalmic solution 1 drop 2 (two) times daily.      Marland Kitchen triamterene-hydrochlorothiazide (DYAZIDE) 37.5-25 MG per capsule Take 1 capsule by mouth every morning.       No facility-administered medications prior to visit.       Objective:   Physical Exam Filed Vitals:   02/11/13 1517  BP: 140/92  Pulse: 72  Height: 5' 4.5" (1.638 m)  Weight: 179 lb (81.194 kg)  SpO2: 94%   Gen: Pleasant elderly man, well-nourished, in no distress,  normal affect  ENT: No lesions,  mouth clear,  oropharynx clear, no postnasal drip  Neck: No JVD, no TMG, no carotid bruits  Lungs: No use of accessory muscles, clear without rales or rhonchi  Cardiovascular: RRR, early syst M, no murmur or gallops, no peripheral edema  Musculoskeletal: No deformities, no cyanosis or clubbing  Neuro: alert, non focal  Skin: Warm, no lesions or rashes     Assessment & Plan:  Cough - contribution of allergies and GERD.  - prilosec daily  Dyspnea on exertion Etiology unclear. His cardiac eval has been reassuring. Consider AFL, exacerbated by allergies or GERD.  - full PFT to compare with priors from [place].   Pulmonary nodule, left Per [facility] reports it grew slightly from '08 to '12, then was stable from '12 to '13. Will discuss timing of repeat CT with him next time.

## 2013-02-11 NOTE — Assessment & Plan Note (Signed)
Etiology unclear. His cardiac eval has been reassuring. Consider AFL, exacerbated by allergies or GERD.  - full PFT to compare with priors from [place].

## 2013-02-11 NOTE — Assessment & Plan Note (Addendum)
-   contribution of allergies and GERD.  - prilosec daily

## 2013-02-15 DIAGNOSIS — H353290 Exudative age-related macular degeneration, unspecified eye, stage unspecified: Secondary | ICD-10-CM | POA: Diagnosis not present

## 2013-02-15 DIAGNOSIS — H35419 Lattice degeneration of retina, unspecified eye: Secondary | ICD-10-CM | POA: Diagnosis not present

## 2013-02-15 DIAGNOSIS — H353190 Nonexudative age-related macular degeneration, unspecified eye, stage unspecified: Secondary | ICD-10-CM | POA: Diagnosis not present

## 2013-02-15 DIAGNOSIS — H35379 Puckering of macula, unspecified eye: Secondary | ICD-10-CM | POA: Diagnosis not present

## 2013-02-18 DIAGNOSIS — Z961 Presence of intraocular lens: Secondary | ICD-10-CM | POA: Diagnosis not present

## 2013-02-18 DIAGNOSIS — H401490 Capsular glaucoma with pseudoexfoliation of lens, unspecified eye, stage unspecified: Secondary | ICD-10-CM | POA: Diagnosis not present

## 2013-03-15 DIAGNOSIS — H3581 Retinal edema: Secondary | ICD-10-CM | POA: Diagnosis not present

## 2013-03-15 DIAGNOSIS — H353290 Exudative age-related macular degeneration, unspecified eye, stage unspecified: Secondary | ICD-10-CM | POA: Diagnosis not present

## 2013-03-15 DIAGNOSIS — H43829 Vitreomacular adhesion, unspecified eye: Secondary | ICD-10-CM | POA: Diagnosis not present

## 2013-03-15 DIAGNOSIS — H35379 Puckering of macula, unspecified eye: Secondary | ICD-10-CM | POA: Diagnosis not present

## 2013-03-23 ENCOUNTER — Ambulatory Visit (INDEPENDENT_AMBULATORY_CARE_PROVIDER_SITE_OTHER): Payer: Medicare Other | Admitting: Emergency Medicine

## 2013-03-23 ENCOUNTER — Encounter (INDEPENDENT_AMBULATORY_CARE_PROVIDER_SITE_OTHER): Payer: Self-pay

## 2013-03-23 ENCOUNTER — Other Ambulatory Visit: Payer: Self-pay | Admitting: Emergency Medicine

## 2013-03-23 ENCOUNTER — Encounter: Payer: Self-pay | Admitting: Emergency Medicine

## 2013-03-23 VITALS — BP 140/82 | HR 83 | Ht 66.0 in | Wt 173.0 lb

## 2013-03-23 DIAGNOSIS — R0609 Other forms of dyspnea: Principal | ICD-10-CM

## 2013-03-23 DIAGNOSIS — R0989 Other specified symptoms and signs involving the circulatory and respiratory systems: Secondary | ICD-10-CM

## 2013-03-23 DIAGNOSIS — R911 Solitary pulmonary nodule: Secondary | ICD-10-CM | POA: Diagnosis not present

## 2013-03-23 DIAGNOSIS — R0600 Dyspnea, unspecified: Secondary | ICD-10-CM

## 2013-03-23 LAB — PULMONARY FUNCTION TEST
DL/VA % pred: 90 %
DL/VA: 3.84 ml/min/mmHg/L
DLCO unc % pred: 62 %
DLCO unc: 15.97 ml/min/mmHg
FEF 25-75 Post: 2.29 L/sec
FEF 25-75 Pre: 2.24 L/sec
FEF2575-%Change-Post: 2 %
FEF2575-%Pred-Post: 195 %
FEF2575-%Pred-Pre: 191 %
FEV1-%Change-Post: 0 %
FEV1-%Pred-Post: 99 %
FEV1-%Pred-Pre: 98 %
FEV1-Post: 1.93 L
FEV1-Pre: 1.91 L
FEV1FVC-%Change-Post: 0 %
FEV1FVC-%Pred-Pre: 119 %
FEV6-%Change-Post: 0 %
FEV6-%Pred-Post: 87 %
FEV6-%Pred-Pre: 86 %
FEV6-Post: 2.31 L
FEV6-Pre: 2.29 L
FEV6FVC-%Pred-Post: 109 %
FEV6FVC-%Pred-Pre: 109 %
FVC-%Change-Post: 0 %
FVC-%Pred-Post: 79 %
FVC-%Pred-Pre: 79 %
FVC-Post: 2.31 L
FVC-Pre: 2.29 L
Post FEV1/FVC ratio: 84 %
Post FEV6/FVC ratio: 100 %
Pre FEV1/FVC ratio: 84 %
Pre FEV6/FVC Ratio: 100 %
RV % pred: 80 %
RV: 2.02 L
TLC % pred: 83 %
TLC: 5.07 L

## 2013-03-23 NOTE — Assessment & Plan Note (Addendum)
S/p PFT today 03/23/13 > spiro looks slightly restricted vs mixed disease, but close to normal. I suspect that the restriction is related to some slight loss of height, kyphosis, a little bit of wt gain and deconditioning. He is diligent about working out, and I have encouraged him to slowly increase his exercise.

## 2013-03-23 NOTE — Progress Notes (Signed)
Subjective:    Patient ID: [name], male    DOB: 15-Jul-1923, 80 y.o.   MRN: [id number]  HPI 80 yo man, minimal tobacco hx, hx hairy cell leukemia, colon Ca, GERD, HTN, anemia. He has had cytoxin. He is referred for exertional SOB.  He was seen by Dr Mare Ferrari > low risk perfusion scan 01/21/13, TTE with normal LV fxn and diastolic dysfxn on [id number]. He had BAL and Bx's in 2007 for a LUL pulm nodule that has been followed at [facility] (Dr Kerin Ransom) > increased between 08 and 12, then stable to 13 (CT's at [facility]). His SOB has progressed over the last year, occurs with stairs, walking through a large parking lot.  No associated CP, no claudication. He has had a mild cough for 4 months, may have had some wheeze. He had PFT done a few years ago. He has gained 3-4 lbs over last 2 years.   ROV 03/23/13 -- follows up today for dyspnea on exertion, slowly progressive as well as cough. He does have a hx GERD sx. Also w hx LUL pulm nodule followed since 2007. His CT scans were done at [facility]. He continues to be able to do chores, carry things, workout 2x a week. His is taking prilosec, cough may be a bit better.    04/21/11 --  PRE POST  Ref Best %Pred Best %Pred %Chg FVC Liters 3.61 2.71 75 FEV1 Liters 2.69 1.92 71 FEV1/FVC % 76 71 94  FEV1/SVC % 67 FEF25-75% L/sec 2.33 1.15 49 PEF L/sec 6.79 7.79 115 MVV L/min 95 89 93 VC Liters 3.61 2.85 79  TLC Liters 6.29 4.80 76 RV Liters 2.55 1.95 76 FRC N2 Liters 3.40 2.75 81 ERV Liters 1.19 0.80 67 IC Liters 2.38 2.05 86  DLCO mL/mmHg/min 21.4 14.5 68 VA Liters 4.11 IVC Liters 2.51 BHT Sec 13.10    Review of Systems  Constitutional: Negative for fever and unexpected weight change.  HENT: Negative for congestion, dental problem, ear pain, nosebleeds, postnasal drip, rhinorrhea, sinus pressure, sneezing, sore throat and trouble swallowing.   Eyes: Negative for redness and itching.  Respiratory: Positive for cough, chest tightness and shortness of  breath. Negative for wheezing.   Cardiovascular: Negative for palpitations and leg swelling.  Gastrointestinal: Negative for nausea and vomiting.  Genitourinary: Negative for dysuria.  Musculoskeletal: Negative for joint swelling.  Skin: Negative for rash.  Neurological: Negative for headaches.  Hematological: Does not bruise/bleed easily.  Psychiatric/Behavioral: Negative for dysphoric mood. The patient is not nervous/anxious.        Objective:   Physical Exam Filed Vitals:   03/23/13 1219  BP: 140/82  Pulse: 83  Height: 5\' 6"  (1.676 m)  Weight: 173 lb (78.472 kg)  SpO2: 94%   Gen: Pleasant elderly man, well-nourished, in no distress,  normal affect  ENT: No lesions,  mouth clear,  oropharynx clear, no postnasal drip  Neck: No JVD, no TMG, no carotid bruits  Lungs: No use of accessory muscles, clear without rales or rhonchi  Cardiovascular: RRR, early syst M, no murmur or gallops, no peripheral edema  Musculoskeletal: No deformities, no cyanosis or clubbing  Neuro: alert, non focal  Skin: Warm, no lesions or rashes   01/27/06 CT scan [facility] --  No prior study for comparison. Findings: There is a 1.2 cm nodule in the central aspect of the lingula into which courses the segmental lingular bronchus. There are also multiple tiny punctate nodules. Punctate subpleural nodule left lower lobe on  image 41. Questionable punctate nodule in the left lower lobe on image 45. Punctate subpleural nodule in the left upper lobe on image 15. Punctate nodule in the superior segment left lower lobe on image 25. No pleural or pericardial effusion. Small hiatal hernia. Coronary artery calcification. Airways patent. Images of the upper abdomen reveal patient to be status post splenectomy. Impression: 1. Several left lung indeterminate nodules, the largest of which measures 1.2 cm and lies within the superior segment of the lingula. Reportedly, the left midlung nodule is a new  radiographic finding compared to a 2003 radiograph. Question metastatic disease. Differential also includes atypical infection. Of note if tissue biopsy is desired, transbronchial approach may be preferable given the central location of the nodule as well as the presence of an adjacent bronchus.  04/21/11 CT scan [facility] --  Compare: Chest CT dated 02/11/2011, 06/09/2006 and 01/27/2006 Findings: There are hypoattenuating structures in the bilateral thyroid lobes. The neck base is otherwise normal. The central airways are patent. The round nodule in the left upper lobe (image 29) measures 2.1 x 2.0 cm, measuring 2.0 x 1.8 cm on most recent comparison, and 1.2 x 1.4 cm on CT in 2008. Despite suboptimally thick slices of 5 mm on the 2008 CT, volumetric data was obtained with volume of 1.27 cm3 in 2008 and measures 5.30 cm3, with a volume doubling time of 774 days. The histogram of the attenuation values (HU) result in a mean value -2HU, minimum -62HU and maximum 100HU. There are cluster of 3 nodules (series 1, image 211), the largest of which measures 0.8 x 0.4 cm, all stable from prior. There are several other scattered small nodules measuring up to 3 mm, several of which are calcified, appear stable. There are no pathologically enlarged supraclavicular, axillary or mediastinal lymph nodes. The heart is normal in caliber. No pericardial effusion. Coronary artery and aortic calcifications are noted. The pulmonary artery and aorta are within normal limits for size. There is a large intramuscular fat-containing lesion mixed with some soft tissue attenuation along the right chest wall, just lateral to the inferior edge of the scapula within the latissimus dorsi. This has increased in size and configuration from 2007. There are surgical clips on the left status post splenectomy. No other significant intra-abdominal abnormalities are identified. Multilevel degenerative changes are seen throughout  the spine. No aggressive appearing osseous lesions.  Impression: 1. Left upper lobe nodule is essentially stable in size from most recent comparison, however has been increasing slowly in size since CT from 2008. The nodule demonstrates a volumetric doubling time (VDT) of 774 days. Differential consideration of this low attenuation nodule includes slow growing hamartoma, bronchogenic cyst, granuloma versus an indolent neoplasm like carcinoid; the latter given the long VDT is considered very unlikely. 2. Incompletely imaged large right intramuscular fat containing mass, mixed with internal soft tissue attenuation. This has increased in size from 2007. Clinical correlation is recommended and further imaging such as MR may be considered. This may represent lipoma however low-grade liposarcoma cannot be excluded.       Assessment & Plan:  Pulmonary nodule, left This LUL nodule was stable on Ct scan from 2012 to 2013. The last scan was at [facility] in 04/2011. There has been some slow growth since 2008, and a slow-growing malignancy is still a possibility.  - will repeat his CT scan in 04/2013 to compare; obtain the old scans from [facility]  Dyspnea on exertion S/p PFT today 03/23/13 > spiro looks slightly restricted  vs mixed disease, but close to normal. I suspect that the restriction is related to some slight loss of height, kyphosis, a little bit of wt gain and deconditioning. He is diligent about working out, and I have encouraged him to slowly increase his exercise.

## 2013-03-23 NOTE — Patient Instructions (Signed)
Continue your Prilosec daily  We will perform a CT scan of your chest to compare your Left lung nodule to priors.  Follow with Dr Lamonte Sakai in 2 months or sooner if you have any problems.

## 2013-03-23 NOTE — Progress Notes (Signed)
PFT done today. Kaidence Sant,CMA  

## 2013-03-23 NOTE — Assessment & Plan Note (Signed)
This LUL nodule was stable on Ct scan from 2012 to 2013. The last scan was at [facility] in 04/2011. There has been some slow growth since 2008, and a slow-growing malignancy is still a possibility.  - will repeat his CT scan in 04/2013 to compare; obtain the old scans from [facility]

## 2013-03-29 ENCOUNTER — Ambulatory Visit (INDEPENDENT_AMBULATORY_CARE_PROVIDER_SITE_OTHER)
Admission: RE | Admit: 2013-03-29 | Discharge: 2013-03-29 | Disposition: A | Discharge status: Home / Self Care | Payer: Medicare Other | Source: Ambulatory Visit | Attending: Emergency Medicine | Admitting: Emergency Medicine

## 2013-03-29 DIAGNOSIS — R911 Solitary pulmonary nodule: Secondary | ICD-10-CM | POA: Diagnosis not present

## 2013-04-12 DIAGNOSIS — H43829 Vitreomacular adhesion, unspecified eye: Secondary | ICD-10-CM | POA: Diagnosis not present

## 2013-04-12 DIAGNOSIS — H35419 Lattice degeneration of retina, unspecified eye: Secondary | ICD-10-CM | POA: Diagnosis not present

## 2013-04-12 DIAGNOSIS — H35379 Puckering of macula, unspecified eye: Secondary | ICD-10-CM | POA: Diagnosis not present

## 2013-04-12 DIAGNOSIS — H353290 Exudative age-related macular degeneration, unspecified eye, stage unspecified: Secondary | ICD-10-CM | POA: Diagnosis not present

## 2013-04-20 ENCOUNTER — Telehealth: Payer: Self-pay | Admitting: Emergency Medicine

## 2013-04-20 NOTE — Telephone Encounter (Signed)
Pt had CT Chest done on Jan 27.  He is requesting these results.  Pls advise.  Thank you.

## 2013-04-20 NOTE — Telephone Encounter (Signed)
Discussed with the patient that his LUL nodule is very round and smooth, has increased slightly in size compared with 2013 films from [place], now measures 2.6cm in largest diameter. It has been followed since 2008. We discussed possible PET scan today. He is supposed to see Dr Dorothea Ogle with Oncology at [facility] in the next few weeks. I would like for him to review these findings with her, then get back with me to coordinate the plans. Considering PET scan or repeat CT scan at some interval. The nodule is in a poor location for needle bx or for FOB. i am not sure he would do well with a surgery. The best plan here may be to follow it.

## 2013-05-10 DIAGNOSIS — H35419 Lattice degeneration of retina, unspecified eye: Secondary | ICD-10-CM | POA: Diagnosis not present

## 2013-05-10 DIAGNOSIS — H353190 Nonexudative age-related macular degeneration, unspecified eye, stage unspecified: Secondary | ICD-10-CM | POA: Diagnosis not present

## 2013-05-10 DIAGNOSIS — H35379 Puckering of macula, unspecified eye: Secondary | ICD-10-CM | POA: Diagnosis not present

## 2013-05-10 DIAGNOSIS — H353290 Exudative age-related macular degeneration, unspecified eye, stage unspecified: Secondary | ICD-10-CM | POA: Diagnosis not present

## 2013-05-10 DIAGNOSIS — H43829 Vitreomacular adhesion, unspecified eye: Secondary | ICD-10-CM | POA: Diagnosis not present

## 2013-05-11 DIAGNOSIS — R911 Solitary pulmonary nodule: Secondary | ICD-10-CM | POA: Diagnosis not present

## 2013-05-11 DIAGNOSIS — C499 Malignant neoplasm of connective and soft tissue, unspecified: Secondary | ICD-10-CM | POA: Diagnosis not present

## 2013-05-24 ENCOUNTER — Ambulatory Visit (INDEPENDENT_AMBULATORY_CARE_PROVIDER_SITE_OTHER): Payer: Medicare Other | Admitting: Emergency Medicine

## 2013-05-24 ENCOUNTER — Encounter: Payer: Self-pay | Admitting: Emergency Medicine

## 2013-05-24 VITALS — BP 130/88 | HR 63 | Ht 64.5 in | Wt 180.0 lb

## 2013-05-24 DIAGNOSIS — R911 Solitary pulmonary nodule: Secondary | ICD-10-CM | POA: Diagnosis not present

## 2013-05-24 NOTE — Assessment & Plan Note (Signed)
Sightly larger on CT scan 03/29/13, perfectly round. Dr Dorothea Ogle at [facility] agrees with a PET scan. He still may decide to just follow this, defer any kind of procedure.  They have ordered the PET at [facility]. He will consider whether he should have a procedure after the PET is completed.

## 2013-05-24 NOTE — Progress Notes (Signed)
Subjective:    Patient ID: [name], male    DOB: Aug 03, 1923, 80 y.o.   MRN: [id number]  HPI 80 yo man, minimal tobacco hx, hx hairy cell leukemia, colon Ca, GERD, HTN, anemia. He has had cytoxin. He is referred for exertional SOB.  He was seen by Dr Mare Ferrari > low risk perfusion scan 01/21/13, TTE with normal LV fxn and diastolic dysfxn on 21/30. He had BAL and Bx's in 2007 for a LUL pulm nodule that has been followed at [place] (Dr Kerin Ransom) > increased between 08 and 12, then stable to 13 (CT's at [facility]). His SOB has progressed over the last year, occurs with stairs, walking through a large parking lot.  No associated CP, no claudication. He has had a mild cough for 4 months, may have had some wheeze. He had PFT done a few years ago. He has gained 3-4 lbs over last 2 years.   ROV 03/23/13 -- follows up today for dyspnea on exertion, slowly progressive as well as cough. He does have a hx GERD sx. Also w hx LUL pulm nodule followed since 2007. His CT scans were done at [facility]. He continues to be able to do chores, carry things, workout 2x a week. His is taking prilosec, cough may be a bit better.   ROV 05/24/13 -- returns to f/u dyspnea and abnormal CT scan chest w a LUL nodule. CT scan 1/27 shows slowly enlarging slowly. He has not spoken to Dr Dorothea Ogle yet, but his other physician Dr Nydia Bouton has done so. They agree that a PET scan makes sense, and that watchful waiting may be appropriate.      04/21/11 --  PRE POST  Ref Best %Pred Best %Pred %Chg FVC Liters 3.61 2.71 75 FEV1 Liters 2.69 1.92 71 FEV1/FVC % 76 71 94  FEV1/SVC % 67 FEF25-75% L/sec 2.33 1.15 49 PEF L/sec 6.79 7.79 115 MVV L/min 95 89 93 VC Liters 3.61 2.85 79  TLC Liters 6.29 4.80 76 RV Liters 2.55 1.95 76 FRC N2 Liters 3.40 2.75 81 ERV Liters 1.19 0.80 67 IC Liters 2.38 2.05 86  DLCO mL/mmHg/min 21.4 14.5 68 VA Liters 4.11 IVC Liters 2.51 BHT Sec 13.10    Review of Systems  Constitutional: Negative for fever and  unexpected weight change.  HENT: Negative for congestion, dental problem, ear pain, nosebleeds, postnasal drip, rhinorrhea, sinus pressure, sneezing, sore throat and trouble swallowing.   Eyes: Negative for redness and itching.  Respiratory: Positive for cough, chest tightness and shortness of breath. Negative for wheezing.   Cardiovascular: Negative for palpitations and leg swelling.  Gastrointestinal: Negative for nausea and vomiting.  Genitourinary: Negative for dysuria.  Musculoskeletal: Negative for joint swelling.  Skin: Negative for rash.  Neurological: Negative for headaches.  Hematological: Does not bruise/bleed easily.  Psychiatric/Behavioral: Negative for dysphoric mood. The patient is not nervous/anxious.        Objective:   Physical Exam Filed Vitals:   05/24/13 0917  BP: 130/88  Pulse: 63  Height: 5' 4.5" (1.638 m)  Weight: 180 lb (81.647 kg)  SpO2: 95%   Gen: Pleasant elderly man, well-nourished, in no distress,  normal affect  ENT: No lesions,  mouth clear,  oropharynx clear, no postnasal drip  Neck: No JVD, no TMG, no carotid bruits  Lungs: No use of accessory muscles, clear without rales or rhonchi  Cardiovascular: RRR, early syst M, no murmur or gallops, no peripheral edema  Musculoskeletal: No deformities, no cyanosis or clubbing  Neuro: alert, non focal  Skin: Warm, no lesions or rashes   01/27/06 CT scan [facility] --  No prior study for comparison. Findings: There is a 1.2 cm nodule in the central aspect of the lingula into which courses the segmental lingular bronchus. There are also multiple tiny punctate nodules. Punctate subpleural nodule left lower lobe on image 41. Questionable punctate nodule in the left lower lobe on image 45. Punctate subpleural nodule in the left upper lobe on image 15. Punctate nodule in the superior segment left lower lobe on image 25. No pleural or pericardial effusion. Small hiatal hernia. Coronary artery  calcification. Airways patent. Images of the upper abdomen reveal patient to be status post splenectomy. Impression: 1. Several left lung indeterminate nodules, the largest of which measures 1.2 cm and lies within the superior segment of the lingula. Reportedly, the left midlung nodule is a new radiographic finding compared to a 2003 radiograph. Question metastatic disease. Differential also includes atypical infection. Of note if tissue biopsy is desired, transbronchial approach may be preferable given the central location of the nodule as well as the presence of an adjacent bronchus.  04/21/11 CT scan [facility] --  Compare: Chest CT dated 02/11/2011, 06/09/2006 and 01/27/2006 Findings: There are hypoattenuating structures in the bilateral thyroid lobes. The neck base is otherwise normal. The central airways are patent. The round nodule in the left upper lobe (image 29) measures 2.1 x 2.0 cm, measuring 2.0 x 1.8 cm on most recent comparison, and 1.2 x 1.4 cm on CT in 2008. Despite suboptimally thick slices of 5 mm on the 2008 CT, volumetric data was obtained with volume of 1.27 cm3 in 2008 and measures 5.30 cm3, with a volume doubling time of 774 days. The histogram of the attenuation values (HU) result in a mean value -2HU, minimum -62HU and maximum 100HU. There are cluster of 3 nodules (series 1, image 211), the largest of which measures 0.8 x 0.4 cm, all stable from prior. There are several other scattered small nodules measuring up to 3 mm, several of which are calcified, appear stable. There are no pathologically enlarged supraclavicular, axillary or mediastinal lymph nodes. The heart is normal in caliber. No pericardial effusion. Coronary artery and aortic calcifications are noted. The pulmonary artery and aorta are within normal limits for size. There is a large intramuscular fat-containing lesion mixed with some soft tissue attenuation along the right chest wall, just lateral to  the inferior edge of the scapula within the latissimus dorsi. This has increased in size and configuration from 2007. There are surgical clips on the left status post splenectomy. No other significant intra-abdominal abnormalities are identified. Multilevel degenerative changes are seen throughout the spine. No aggressive appearing osseous lesions.  Impression: 1. Left upper lobe nodule is essentially stable in size from most recent comparison, however has been increasing slowly in size since CT from 2008. The nodule demonstrates a volumetric doubling time (VDT) of 774 days. Differential consideration of this low attenuation nodule includes slow growing hamartoma, bronchogenic cyst, granuloma versus an indolent neoplasm like carcinoid; the latter given the long VDT is considered very unlikely. 2. Incompletely imaged large right intramuscular fat containing mass, mixed with internal soft tissue attenuation. This has increased in size from 2007. Clinical correlation is recommended and further imaging such as MR may be considered. This may represent lipoma however low-grade liposarcoma cannot be excluded.       Assessment & Plan:  Pulmonary nodule, left Sightly larger on CT scan 03/29/13, perfectly  round. Dr Dorothea Ogle at [facility] agrees with a PET scan. He still may decide to just follow this, defer any kind of procedure.  They have ordered the PET at [facility]. He will consider whether he should have a procedure after the PET is completed.

## 2013-05-24 NOTE — Patient Instructions (Signed)
Get your PET scan as planned at [facility] Follow with Dr Lamonte Sakai in 1 month to discuss the scan and your visit with Dr Dorothea Ogle

## 2013-05-31 DIAGNOSIS — L821 Other seborrheic keratosis: Secondary | ICD-10-CM | POA: Diagnosis not present

## 2013-05-31 DIAGNOSIS — Z79899 Other long term (current) drug therapy: Secondary | ICD-10-CM | POA: Diagnosis not present

## 2013-05-31 DIAGNOSIS — L219 Seborrheic dermatitis, unspecified: Secondary | ICD-10-CM | POA: Diagnosis not present

## 2013-05-31 DIAGNOSIS — C499 Malignant neoplasm of connective and soft tissue, unspecified: Secondary | ICD-10-CM | POA: Diagnosis not present

## 2013-05-31 DIAGNOSIS — N400 Enlarged prostate without lower urinary tract symptoms: Secondary | ICD-10-CM | POA: Diagnosis not present

## 2013-05-31 DIAGNOSIS — Z09 Encounter for follow-up examination after completed treatment for conditions other than malignant neoplasm: Secondary | ICD-10-CM | POA: Diagnosis not present

## 2013-05-31 DIAGNOSIS — R948 Abnormal results of function studies of other organs and systems: Secondary | ICD-10-CM | POA: Diagnosis not present

## 2013-05-31 DIAGNOSIS — L259 Unspecified contact dermatitis, unspecified cause: Secondary | ICD-10-CM | POA: Diagnosis not present

## 2013-05-31 DIAGNOSIS — Z85828 Personal history of other malignant neoplasm of skin: Secondary | ICD-10-CM | POA: Diagnosis not present

## 2013-05-31 DIAGNOSIS — L570 Actinic keratosis: Secondary | ICD-10-CM | POA: Diagnosis not present

## 2013-06-07 DIAGNOSIS — H35379 Puckering of macula, unspecified eye: Secondary | ICD-10-CM | POA: Diagnosis not present

## 2013-06-07 DIAGNOSIS — H353290 Exudative age-related macular degeneration, unspecified eye, stage unspecified: Secondary | ICD-10-CM | POA: Diagnosis not present

## 2013-06-07 DIAGNOSIS — H353190 Nonexudative age-related macular degeneration, unspecified eye, stage unspecified: Secondary | ICD-10-CM | POA: Diagnosis not present

## 2013-06-07 DIAGNOSIS — H35419 Lattice degeneration of retina, unspecified eye: Secondary | ICD-10-CM | POA: Diagnosis not present

## 2013-06-20 DIAGNOSIS — C61 Malignant neoplasm of prostate: Secondary | ICD-10-CM | POA: Diagnosis not present

## 2013-06-20 DIAGNOSIS — C499 Malignant neoplasm of connective and soft tissue, unspecified: Secondary | ICD-10-CM | POA: Diagnosis not present

## 2013-06-20 DIAGNOSIS — C9140 Hairy cell leukemia not having achieved remission: Secondary | ICD-10-CM | POA: Diagnosis not present

## 2013-06-20 DIAGNOSIS — Z79899 Other long term (current) drug therapy: Secondary | ICD-10-CM | POA: Diagnosis not present

## 2013-06-20 DIAGNOSIS — R911 Solitary pulmonary nodule: Secondary | ICD-10-CM | POA: Diagnosis not present

## 2013-06-20 DIAGNOSIS — Z7982 Long term (current) use of aspirin: Secondary | ICD-10-CM | POA: Diagnosis not present

## 2013-06-20 DIAGNOSIS — C493 Malignant neoplasm of connective and soft tissue of thorax: Secondary | ICD-10-CM | POA: Diagnosis not present

## 2013-06-20 DIAGNOSIS — Z85038 Personal history of other malignant neoplasm of large intestine: Secondary | ICD-10-CM | POA: Diagnosis not present

## 2013-06-20 DIAGNOSIS — C189 Malignant neoplasm of colon, unspecified: Secondary | ICD-10-CM | POA: Diagnosis not present

## 2013-06-23 ENCOUNTER — Encounter (INDEPENDENT_AMBULATORY_CARE_PROVIDER_SITE_OTHER): Payer: Self-pay

## 2013-06-23 ENCOUNTER — Encounter: Payer: Self-pay | Admitting: Emergency Medicine

## 2013-06-23 ENCOUNTER — Ambulatory Visit (INDEPENDENT_AMBULATORY_CARE_PROVIDER_SITE_OTHER): Payer: Medicare Other | Admitting: Emergency Medicine

## 2013-06-23 VITALS — BP 140/88 | HR 72 | Ht 66.0 in | Wt 181.6 lb

## 2013-06-23 DIAGNOSIS — R911 Solitary pulmonary nodule: Secondary | ICD-10-CM

## 2013-06-23 NOTE — Progress Notes (Signed)
Subjective:    Patient ID: [name], male    DOB: Jul 06, 1923, 80 y.o.   MRN: [id number]  HPI 80 yo man, minimal tobacco hx, hx hairy cell leukemia, colon Ca, GERD, HTN, anemia. He has had cytoxin. He is referred for exertional SOB.  He was seen by Dr Mare Ferrari > low risk perfusion scan 01/21/13, TTE with normal LV fxn and diastolic dysfxn on 71/06. He had BAL and Bx's in 2007 for a LUL pulm nodule that has been followed at [place] (Dr Kerin Ransom) > increased between 08 and 12, then stable to 13 (CT's at [facility]). His SOB has progressed over the last year, occurs with stairs, walking through a large parking lot.  No associated CP, no claudication. He has had a mild cough for 4 months, may have had some wheeze. He had PFT done a few years ago. He has gained 3-4 lbs over last 2 years.   ROV 03/23/13 -- follows up today for dyspnea on exertion, slowly progressive as well as cough. He does have a hx GERD sx. Also w hx LUL pulm nodule followed since 2007. His CT scans were done at [facility]. He continues to be able to do chores, carry things, workout 2x a week. His is taking prilosec, cough may be a bit better.   ROV 05/24/13 -- returns to f/u dyspnea and abnormal CT scan chest w a LUL nodule. CT scan 1/27 shows slowly enlarging slowly. He has not spoken to Dr Dorothea Ogle yet, but his other physician Dr Nydia Bouton has done so. They agree that a PET scan makes sense, and that watchful waiting may be appropriate.   ROV 06/23/13 -- follows up for slowly progressive SOB, although overall he is very functional. He also has a LUL nodule that has slowly grown - in setting hx colon CA, hairy cell leukemia.    PET 05/31/13 [facility] --  Impression: 1. Trace FDG metabolic activity in the slowly growing 2.9 cm left upper lobe pulmonary nodule. This low level FDG activity is non-specific and could be seen with a hamartoma or indolent lung neoplasm such as carcinoid. A colon cancer metastasis is very unlikely. A metastasis from  liposarcoma is felt unlikely. 2. No evidence of FDG activity in the right lateral chest wall surgical resection bed to suggest recurrent high grade liposarcoma. 3. Mild focal FDG activity in the peripheral zone of the left posterior aspect of the enlarged prostate. Given a rising PSA, this could represent a prostate cancer. Further evaluation may be warranted. 4. See separate diagnostic CT report.    04/21/11 --  PRE POST  Ref Best %Pred Best %Pred %Chg FVC Liters 3.61 2.71 75 FEV1 Liters 2.69 1.92 71 FEV1/FVC % 76 71 94  FEV1/SVC % 67 FEF25-75% L/sec 2.33 1.15 49 PEF L/sec 6.79 7.79 115 MVV L/min 95 89 93 VC Liters 3.61 2.85 79  TLC Liters 6.29 4.80 76 RV Liters 2.55 1.95 76 FRC N2 Liters 3.40 2.75 81 ERV Liters 1.19 0.80 67 IC Liters 2.38 2.05 86  DLCO mL/mmHg/min 21.4 14.5 68 VA Liters 4.11 IVC Liters 2.51 BHT Sec 13.10    Review of Systems  Constitutional: Negative for fever and unexpected weight change.  HENT: Negative for congestion, dental problem, ear pain, nosebleeds, postnasal drip, rhinorrhea, sinus pressure, sneezing, sore throat and trouble swallowing.   Eyes: Negative for redness and itching.  Respiratory: Positive for cough, chest tightness and shortness of breath. Negative for wheezing.   Cardiovascular: Negative for palpitations and leg  swelling.  Gastrointestinal: Negative for nausea and vomiting.  Genitourinary: Negative for dysuria.  Musculoskeletal: Negative for joint swelling.  Skin: Negative for rash.  Neurological: Negative for headaches.  Hematological: Does not bruise/bleed easily.  Psychiatric/Behavioral: Negative for dysphoric mood. The patient is not nervous/anxious.        Objective:   Physical Exam Filed Vitals:   06/23/13 0908  BP: 140/88  Pulse: 72  Height: 5\' 6"  (1.676 m)  Weight: 181 lb 9.6 oz (82.373 kg)  SpO2: 96%   Gen: Pleasant elderly man, well-nourished, in no distress,  normal affect  ENT: No lesions,  mouth  clear,  oropharynx clear, no postnasal drip  Neck: No JVD, no TMG, no carotid bruits  Lungs: No use of accessory muscles, clear without rales or rhonchi  Cardiovascular: RRR, early syst M, no murmur or gallops, no peripheral edema  Musculoskeletal: No deformities, no cyanosis or clubbing  Neuro: alert, non focal  Skin: Warm, no lesions or rashes   01/27/06 CT scan [facility] --  No prior study for comparison. Findings: There is a 1.2 cm nodule in the central aspect of the lingula into which courses the segmental lingular bronchus. There are also multiple tiny punctate nodules. Punctate subpleural nodule left lower lobe on image 41. Questionable punctate nodule in the left lower lobe on image 45. Punctate subpleural nodule in the left upper lobe on image 15. Punctate nodule in the superior segment left lower lobe on image 25. No pleural or pericardial effusion. Small hiatal hernia. Coronary artery calcification. Airways patent. Images of the upper abdomen reveal patient to be status post splenectomy. Impression: 1. Several left lung indeterminate nodules, the largest of which measures 1.2 cm and lies within the superior segment of the lingula. Reportedly, the left midlung nodule is a new radiographic finding compared to a 2003 radiograph. Question metastatic disease. Differential also includes atypical infection. Of note if tissue biopsy is desired, transbronchial approach may be preferable given the central location of the nodule as well as the presence of an adjacent bronchus.  04/21/11 CT scan [facility] --  Compare: Chest CT dated 02/11/2011, 06/09/2006 and 01/27/2006 Findings: There are hypoattenuating structures in the bilateral thyroid lobes. The neck base is otherwise normal. The central airways are patent. The round nodule in the left upper lobe (image 29) measures 2.1 x 2.0 cm, measuring 2.0 x 1.8 cm on most recent comparison, and 1.2 x 1.4 cm on CT in 2008. Despite suboptimally  thick slices of 5 mm on the 2008 CT, volumetric data was obtained with volume of 1.27 cm3 in 2008 and measures 5.30 cm3, with a volume doubling time of 774 days. The histogram of the attenuation values (HU) result in a mean value -2HU, minimum -62HU and maximum 100HU. There are cluster of 3 nodules (series 1, image 211), the largest of which measures 0.8 x 0.4 cm, all stable from prior. There are several other scattered small nodules measuring up to 3 mm, several of which are calcified, appear stable. There are no pathologically enlarged supraclavicular, axillary or mediastinal lymph nodes. The heart is normal in caliber. No pericardial effusion. Coronary artery and aortic calcifications are noted. The pulmonary artery and aorta are within normal limits for size. There is a large intramuscular fat-containing lesion mixed with some soft tissue attenuation along the right chest wall, just lateral to the inferior edge of the scapula within the latissimus dorsi. This has increased in size and configuration from 2007. There are surgical clips on the left  status post splenectomy. No other significant intra-abdominal abnormalities are identified. Multilevel degenerative changes are seen throughout the spine. No aggressive appearing osseous lesions.  Impression: 1. Left upper lobe nodule is essentially stable in size from most recent comparison, however has been increasing slowly in size since CT from 2008. The nodule demonstrates a volumetric doubling time (VDT) of 774 days. Differential consideration of this low attenuation nodule includes slow growing hamartoma, bronchogenic cyst, granuloma versus an indolent neoplasm like carcinoid; the latter given the long VDT is considered very unlikely. 2. Incompletely imaged large right intramuscular fat containing mass, mixed with internal soft tissue attenuation. This has increased in size from 2007. Clinical correlation is recommended and further  imaging such as MR may be considered. This may represent lipoma however low-grade liposarcoma cannot be excluded.       Assessment & Plan:  Pulmonary nodule, left Most recent eval of the slowly enlarging LUL nodule shows some low-level uptake on PET scan 05/31/13 at [facility]. This may very well be slow growing adenoCA vs carcinoid etc. At this point the risk / benefit seems to favor waiting and watching. i will discuss timing of any repeat scanning with Dr Dorothea Ogle.  - follow to discuss in 6 months

## 2013-06-23 NOTE — Patient Instructions (Signed)
Please follow with Dr Lamonte Sakai in 6 months or sooner if you have any problems.

## 2013-06-23 NOTE — Assessment & Plan Note (Signed)
Most recent eval of the slowly enlarging LUL nodule shows some low-level uptake on PET scan 05/31/13 at [facility]. This may very well be slow growing adenoCA vs carcinoid etc. At this point the risk / benefit seems to favor waiting and watching. i will discuss timing of any repeat scanning with Dr Dorothea Ogle.  - follow to discuss in 6 months

## 2013-07-05 DIAGNOSIS — H353290 Exudative age-related macular degeneration, unspecified eye, stage unspecified: Secondary | ICD-10-CM | POA: Diagnosis not present

## 2013-07-05 DIAGNOSIS — H353190 Nonexudative age-related macular degeneration, unspecified eye, stage unspecified: Secondary | ICD-10-CM | POA: Diagnosis not present

## 2013-07-05 DIAGNOSIS — H35379 Puckering of macula, unspecified eye: Secondary | ICD-10-CM | POA: Diagnosis not present

## 2013-07-05 DIAGNOSIS — H43829 Vitreomacular adhesion, unspecified eye: Secondary | ICD-10-CM | POA: Diagnosis not present

## 2013-08-01 DIAGNOSIS — Z125 Encounter for screening for malignant neoplasm of prostate: Secondary | ICD-10-CM | POA: Diagnosis not present

## 2013-08-01 DIAGNOSIS — E785 Hyperlipidemia, unspecified: Secondary | ICD-10-CM | POA: Diagnosis not present

## 2013-08-01 DIAGNOSIS — I10 Essential (primary) hypertension: Secondary | ICD-10-CM | POA: Diagnosis not present

## 2013-08-08 DIAGNOSIS — J309 Allergic rhinitis, unspecified: Secondary | ICD-10-CM | POA: Diagnosis not present

## 2013-08-08 DIAGNOSIS — I10 Essential (primary) hypertension: Secondary | ICD-10-CM | POA: Diagnosis not present

## 2013-08-08 DIAGNOSIS — Z1331 Encounter for screening for depression: Secondary | ICD-10-CM | POA: Diagnosis not present

## 2013-08-08 DIAGNOSIS — E785 Hyperlipidemia, unspecified: Secondary | ICD-10-CM | POA: Diagnosis not present

## 2013-08-08 DIAGNOSIS — Z0000 Encounter for general adult medical examination without abnormal findings: Secondary | ICD-10-CM | POA: Diagnosis not present

## 2013-08-08 DIAGNOSIS — R0609 Other forms of dyspnea: Secondary | ICD-10-CM | POA: Diagnosis not present

## 2013-08-08 DIAGNOSIS — R5383 Other fatigue: Secondary | ICD-10-CM | POA: Diagnosis not present

## 2013-08-08 DIAGNOSIS — H6120 Impacted cerumen, unspecified ear: Secondary | ICD-10-CM | POA: Diagnosis not present

## 2013-08-08 DIAGNOSIS — R5381 Other malaise: Secondary | ICD-10-CM | POA: Diagnosis not present

## 2013-08-08 DIAGNOSIS — H9190 Unspecified hearing loss, unspecified ear: Secondary | ICD-10-CM | POA: Diagnosis not present

## 2013-08-08 DIAGNOSIS — R972 Elevated prostate specific antigen [PSA]: Secondary | ICD-10-CM | POA: Diagnosis not present

## 2013-08-09 DIAGNOSIS — H35379 Puckering of macula, unspecified eye: Secondary | ICD-10-CM | POA: Diagnosis not present

## 2013-08-09 DIAGNOSIS — H353190 Nonexudative age-related macular degeneration, unspecified eye, stage unspecified: Secondary | ICD-10-CM | POA: Diagnosis not present

## 2013-08-09 DIAGNOSIS — H353290 Exudative age-related macular degeneration, unspecified eye, stage unspecified: Secondary | ICD-10-CM | POA: Diagnosis not present

## 2013-08-09 DIAGNOSIS — H35419 Lattice degeneration of retina, unspecified eye: Secondary | ICD-10-CM | POA: Diagnosis not present

## 2013-08-09 DIAGNOSIS — Z1212 Encounter for screening for malignant neoplasm of rectum: Secondary | ICD-10-CM | POA: Diagnosis not present

## 2013-08-10 ENCOUNTER — Encounter: Payer: Self-pay | Admitting: Cardiology

## 2013-08-10 ENCOUNTER — Ambulatory Visit (INDEPENDENT_AMBULATORY_CARE_PROVIDER_SITE_OTHER): Payer: Medicare Other | Admitting: Cardiology

## 2013-08-10 VITALS — BP 143/80 | HR 73 | Ht 66.0 in | Wt 183.0 lb

## 2013-08-10 DIAGNOSIS — R0989 Other specified symptoms and signs involving the circulatory and respiratory systems: Secondary | ICD-10-CM | POA: Diagnosis not present

## 2013-08-10 DIAGNOSIS — I119 Hypertensive heart disease without heart failure: Secondary | ICD-10-CM | POA: Diagnosis not present

## 2013-08-10 DIAGNOSIS — R0609 Other forms of dyspnea: Secondary | ICD-10-CM | POA: Diagnosis not present

## 2013-08-10 DIAGNOSIS — R0600 Dyspnea, unspecified: Secondary | ICD-10-CM

## 2013-08-10 DIAGNOSIS — E7800 Pure hypercholesterolemia, unspecified: Secondary | ICD-10-CM | POA: Diagnosis not present

## 2013-08-10 DIAGNOSIS — I359 Nonrheumatic aortic valve disorder, unspecified: Secondary | ICD-10-CM

## 2013-08-10 DIAGNOSIS — R079 Chest pain, unspecified: Secondary | ICD-10-CM

## 2013-08-10 NOTE — Assessment & Plan Note (Signed)
Patient complains of easy fatigue and dyspnea on exertion.  These complaints have been chronic.  He had a recent complete physical with Dr. Philip Aspen earlier this week.  Dr. Sharlett Iles suggested that he leave off his Avodart since this can sometimes cause fatigue.  He started leaving off his Avodart yesterday.  Generally the patient has nocturia x1.

## 2013-08-10 NOTE — Assessment & Plan Note (Signed)
The patient has not been having any dizzy spells or syncope.  No headaches.  No awareness of racing of his heart or palpitations .

## 2013-08-10 NOTE — Assessment & Plan Note (Signed)
The patient denies any chest pain or angina pectoris.

## 2013-08-10 NOTE — Progress Notes (Signed)
[name] Date of Birth:  [birth year] [facility] [address] [place], [place]  [id number] [phone]        Fax   [phone]   History of Present Illness: This 80 year old married Caucasian gentleman is seen for a six-month followup office visit. He is a medical patient of Dr. [name]. He has a complex past medical history. He has a history of hairy cell leukemia initially diagnosed in 1980 by Dr. Constance Holster and subsequently followed at [facility]. He has had splenectomy. He has a known pulmonary nodule which has been stable in which has been followed at [facility] and by Dr. Kyung Rudd here in [place].  A PET scan shows that the lesion is not highly active. He has a history of colon cancer in 1984. 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 In 2008 he developed a staph infection of his spine and right hand and required 7 weeks of IV antibiotics with a PICC line under the care of Dr. Megan Salon and Dr. Orene Desanctis. In 2009 he had appendicitis. He has a history of prostate problems with BPH and probable prostate cancer and is followed by urology at [facility]  The patient underwent an echocardiogram on 01/17/13 which showed normal left ventricular systolic function with an ejection fraction of 60-65%.  There was grade 1 diastolic dysfunction.  There was mild aortic stenosis with peak gradient of 25 and mean gradient of 14. The patient underwent a Lexi scan Myoview stress test on 01/20/13 which showed an ejection fraction of 77% and was felt to be low risk for ischemic heart disease.  Since last visit the patient has been doing reasonably well but continues to complain of shortness of breath and decreased energy.  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  Current Outpatient Prescriptions  Medication Sig Dispense Refill  . aspirin 81 MG tablet Take 81 mg by mouth daily.      . calcitonin, salmon, (MIACALCIN/FORTICAL)  200 UNIT/ACT nasal spray Place 1 spray into the nose daily.      . Cholecalciferol (VITAMIN D-3 PO) Take by mouth daily.      Marland Kitchen LATANOPROST OP Apply to eye. In each eye      . Multiple Vitamins-Minerals (CENTRUM SILVER ADULT 50+ PO) Take by mouth daily.      . Multiple Vitamins-Minerals (PRESERVISION AREDS PO) Take by mouth daily.      Marland Kitchen omega-3 fish oil (MAXEPA) 1000 MG CAPS capsule Take by mouth daily.      . Omeprazole (PRILOSEC PO) Take by mouth daily.      . sertraline (ZOLOFT) 50 MG tablet Take 50 mg by mouth daily.      . tamsulosin (FLOMAX) 0.4 MG CAPS capsule Take 0.4 mg by mouth daily after supper.      . timolol (BETIMOL) 0.5 % ophthalmic solution Place 1 drop into the right eye 2 (two) times daily.       Marland Kitchen triamterene-hydrochlorothiazide (DYAZIDE) 37.5-25 MG per capsule Take 1 capsule by mouth every morning.      . dutasteride (AVODART) 0.5 MG capsule Take 0.5 mg by mouth daily.       No current facility-administered medications for this visit.    Allergies  Allergen Reactions  . Antazoline Anaphylaxis  . Antihistamines, Chlorpheniramine-Type     Nervous, jittery  . Sulfa Antibiotics Rash    Patient Active Problem List  Diagnosis Date Noted  . Cough 02/11/2013  . Pulmonary nodule, left 02/11/2013  . Chest pain 01/03/2013  . Dyspnea on exertion 01/03/2013  . Hairy cell leukemia 01/03/2013  . Benign hypertensive heart disease without heart failure 01/03/2013  . INFECTION, STAPHYLOCOCCUS AUREUS 11/30/2006    History  Smoking status  . Former Smoker -- 0.25 packs/day for 1 years  . Types: Cigarettes, Cigars  Smokeless tobacco  . Never Used    Comment: occasional social smoker during college.    History  Alcohol Use  . 2.5 oz/week  . 5 drink(s) per week    No family history on file.  Review of Systems: Constitutional: no fever chills diaphoresis or fatigue or change in weight.  Head and neck: no hearing loss, no epistaxis, no photophobia or visual  disturbance. Respiratory: No cough, shortness of breath or wheezing. Cardiovascular: No chest pain peripheral edema, palpitations. Gastrointestinal: No abdominal distention, no abdominal pain, no change in bowel habits hematochezia or melena. Genitourinary: No dysuria, no frequency, no urgency, no nocturia. Musculoskeletal:No arthralgias, no back pain, no gait disturbance or myalgias. Neurological: No dizziness, no headaches, no numbness, no seizures, no syncope, no weakness, no tremors. Hematologic: No lymphadenopathy, no easy bruising. Psychiatric: No confusion, no hallucinations, no sleep disturbance.    Physical Exam: Filed Vitals:   08/10/13 1526  BP: 143/80  Pulse: 73   the general appearance reveals a well-developed well-nourished mildly obese gentleman in no acute distress.The head and neck exam reveals pupils equal and reactive.  Extraocular movements are full.  There is no scleral icterus.  The mouth and pharynx are normal.  The neck is supple.  The carotids reveal no bruits.  The jugular venous pressure is normal.  The  thyroid is not enlarged.  There is no lymphadenopathy.  The chest is clear to percussion and auscultation.  There are no rales or rhonchi.  Expansion of the chest is symmetrical.  The precordium is quiet.  The first heart sound is normal.  The second heart sound is physiologically split.  There is grade 2/6 systolic ejection murmur at the base.  No diastolic murmur.  There is no abnormal lift or heave.  The abdomen is soft and nontender.  The bowel sounds are normal.  The liver and spleen are not enlarged.  There are no abdominal masses.  There are no abdominal bruits.  Extremities reveal good pedal pulses.  There is no phlebitis or edema.  There is no cyanosis or clubbing.  Strength is normal and symmetrical in all extremities.  There is no lateralizing weakness.  There are no sensory deficits.  The skin is warm and dry.  There is no rash.     Assessment / Plan: 1.   Diastolic dysfunction with exertional dyspnea and easy fatigue 2. mild aortic valve stenosis 3. history of her cell leukemia 4. essential hypertension 5. BPH  Plan: No new medication prescribed.  I have encouraged him to lose weight.  His weight is up 3 pounds since I last saw him 6 months ago.  I told him that I thought that if he could lose 10 or 15 pounds it would help his exertional dyspnea and his fatigue. Recheck in 6 months for office visit and EKG

## 2013-08-10 NOTE — Patient Instructions (Signed)
Your physician recommends that you continue on your current medications as directed. Please refer to the Current Medication list given to you today.  Your physician wants you to follow-up in: 6 month ov/ekg You will receive a reminder letter in the mail two months in advance. If you don't receive a letter, please call our office to schedule the follow-up appointment.   Work on diet and weight loss

## 2013-09-13 DIAGNOSIS — H353190 Nonexudative age-related macular degeneration, unspecified eye, stage unspecified: Secondary | ICD-10-CM | POA: Diagnosis not present

## 2013-09-13 DIAGNOSIS — H35419 Lattice degeneration of retina, unspecified eye: Secondary | ICD-10-CM | POA: Diagnosis not present

## 2013-09-13 DIAGNOSIS — H353290 Exudative age-related macular degeneration, unspecified eye, stage unspecified: Secondary | ICD-10-CM | POA: Diagnosis not present

## 2013-09-13 DIAGNOSIS — H35379 Puckering of macula, unspecified eye: Secondary | ICD-10-CM | POA: Diagnosis not present

## 2013-10-11 DIAGNOSIS — H3530 Unspecified macular degeneration: Secondary | ICD-10-CM | POA: Diagnosis not present

## 2013-10-11 DIAGNOSIS — H353290 Exudative age-related macular degeneration, unspecified eye, stage unspecified: Secondary | ICD-10-CM | POA: Diagnosis not present

## 2013-10-11 DIAGNOSIS — H43819 Vitreous degeneration, unspecified eye: Secondary | ICD-10-CM | POA: Diagnosis not present

## 2013-10-11 DIAGNOSIS — H35419 Lattice degeneration of retina, unspecified eye: Secondary | ICD-10-CM | POA: Diagnosis not present

## 2013-10-19 DIAGNOSIS — H409 Unspecified glaucoma: Secondary | ICD-10-CM | POA: Diagnosis not present

## 2013-10-19 DIAGNOSIS — H02109 Unspecified ectropion of unspecified eye, unspecified eyelid: Secondary | ICD-10-CM | POA: Diagnosis not present

## 2013-10-19 DIAGNOSIS — H401490 Capsular glaucoma with pseudoexfoliation of lens, unspecified eye, stage unspecified: Secondary | ICD-10-CM | POA: Diagnosis not present

## 2013-11-08 DIAGNOSIS — H35419 Lattice degeneration of retina, unspecified eye: Secondary | ICD-10-CM | POA: Diagnosis not present

## 2013-11-08 DIAGNOSIS — H3340 Traction detachment of retina, unspecified eye: Secondary | ICD-10-CM | POA: Diagnosis not present

## 2013-11-08 DIAGNOSIS — H353290 Exudative age-related macular degeneration, unspecified eye, stage unspecified: Secondary | ICD-10-CM | POA: Diagnosis not present

## 2013-11-08 DIAGNOSIS — H353190 Nonexudative age-related macular degeneration, unspecified eye, stage unspecified: Secondary | ICD-10-CM | POA: Diagnosis not present

## 2013-11-10 DIAGNOSIS — M47814 Spondylosis without myelopathy or radiculopathy, thoracic region: Secondary | ICD-10-CM | POA: Diagnosis not present

## 2013-11-10 DIAGNOSIS — R911 Solitary pulmonary nodule: Secondary | ICD-10-CM | POA: Diagnosis not present

## 2013-11-10 DIAGNOSIS — C499 Malignant neoplasm of connective and soft tissue, unspecified: Secondary | ICD-10-CM | POA: Diagnosis not present

## 2013-11-10 DIAGNOSIS — Z8589 Personal history of malignant neoplasm of other organs and systems: Secondary | ICD-10-CM | POA: Diagnosis not present

## 2013-12-06 DIAGNOSIS — H35412 Lattice degeneration of retina, left eye: Secondary | ICD-10-CM | POA: Diagnosis not present

## 2013-12-06 DIAGNOSIS — H35371 Puckering of macula, right eye: Secondary | ICD-10-CM | POA: Diagnosis not present

## 2013-12-06 DIAGNOSIS — H3341 Traction detachment of retina, right eye: Secondary | ICD-10-CM | POA: Diagnosis not present

## 2013-12-06 DIAGNOSIS — H43821 Vitreomacular adhesion, right eye: Secondary | ICD-10-CM | POA: Diagnosis not present

## 2013-12-06 DIAGNOSIS — H3532 Exudative age-related macular degeneration: Secondary | ICD-10-CM | POA: Diagnosis not present

## 2013-12-12 DIAGNOSIS — Z23 Encounter for immunization: Secondary | ICD-10-CM | POA: Diagnosis not present

## 2013-12-12 DIAGNOSIS — C9140 Hairy cell leukemia not having achieved remission: Secondary | ICD-10-CM | POA: Diagnosis not present

## 2013-12-12 DIAGNOSIS — Z79899 Other long term (current) drug therapy: Secondary | ICD-10-CM | POA: Diagnosis not present

## 2013-12-12 DIAGNOSIS — R911 Solitary pulmonary nodule: Secondary | ICD-10-CM | POA: Diagnosis not present

## 2013-12-12 DIAGNOSIS — Z85038 Personal history of other malignant neoplasm of large intestine: Secondary | ICD-10-CM | POA: Diagnosis not present

## 2013-12-12 DIAGNOSIS — Z8589 Personal history of malignant neoplasm of other organs and systems: Secondary | ICD-10-CM | POA: Diagnosis not present

## 2013-12-26 DIAGNOSIS — H02102 Unspecified ectropion of right lower eyelid: Secondary | ICD-10-CM | POA: Diagnosis not present

## 2014-01-06 DIAGNOSIS — Z23 Encounter for immunization: Secondary | ICD-10-CM | POA: Diagnosis not present

## 2014-01-11 DIAGNOSIS — H35372 Puckering of macula, left eye: Secondary | ICD-10-CM | POA: Diagnosis not present

## 2014-01-11 DIAGNOSIS — H35412 Lattice degeneration of retina, left eye: Secondary | ICD-10-CM | POA: Diagnosis not present

## 2014-01-11 DIAGNOSIS — H43813 Vitreous degeneration, bilateral: Secondary | ICD-10-CM | POA: Diagnosis not present

## 2014-01-11 DIAGNOSIS — H3532 Exudative age-related macular degeneration: Secondary | ICD-10-CM | POA: Diagnosis not present

## 2014-01-13 DIAGNOSIS — R972 Elevated prostate specific antigen [PSA]: Secondary | ICD-10-CM | POA: Diagnosis not present

## 2014-01-13 DIAGNOSIS — N429 Disorder of prostate, unspecified: Secondary | ICD-10-CM | POA: Diagnosis not present

## 2014-01-30 DIAGNOSIS — H401431 Capsular glaucoma with pseudoexfoliation of lens, bilateral, mild stage: Secondary | ICD-10-CM | POA: Diagnosis not present

## 2014-02-03 ENCOUNTER — Telehealth: Payer: Self-pay | Admitting: Emergency Medicine

## 2014-02-03 DIAGNOSIS — R972 Elevated prostate specific antigen [PSA]: Secondary | ICD-10-CM | POA: Diagnosis not present

## 2014-02-03 DIAGNOSIS — I10 Essential (primary) hypertension: Secondary | ICD-10-CM | POA: Diagnosis not present

## 2014-02-03 DIAGNOSIS — R0789 Other chest pain: Secondary | ICD-10-CM | POA: Diagnosis not present

## 2014-02-03 DIAGNOSIS — Z6830 Body mass index (BMI) 30.0-30.9, adult: Secondary | ICD-10-CM | POA: Diagnosis not present

## 2014-02-03 NOTE — Telephone Encounter (Signed)
Spoke with pt, states that he has noticed some burning in chest before he experiences some SOB for about 2 months.  No coughing or congestion.  Pt notes that he had been taking prilosec qod until about 3 months ago, then began taking it qd.  Isn't sure if this would affect the burning in his chest.  Is requesting recs. Last ov:06/23/13 Next ov: recall for 06/2014  Dr. Lamonte Sakai please advise.  Thank you.

## 2014-02-03 NOTE — Telephone Encounter (Signed)
Per RB: this can be address next week as pt has been expriencing symptoms x2 months.

## 2014-02-07 DIAGNOSIS — H3341 Traction detachment of retina, right eye: Secondary | ICD-10-CM | POA: Diagnosis not present

## 2014-02-07 DIAGNOSIS — H43813 Vitreous degeneration, bilateral: Secondary | ICD-10-CM | POA: Diagnosis not present

## 2014-02-07 DIAGNOSIS — H3532 Exudative age-related macular degeneration: Secondary | ICD-10-CM | POA: Diagnosis not present

## 2014-02-07 DIAGNOSIS — H35372 Puckering of macula, left eye: Secondary | ICD-10-CM | POA: Diagnosis not present

## 2014-02-08 NOTE — Telephone Encounter (Signed)
Spoke with pt and advised him of RB's recs.  He states that he saw his PCP on Friday, who scheduled him for a cardiology appt to look further into this.  He is seeing Dr. Mare Ferrari.  Pt states he will keep [place] informed if he has any further pain.  Nothing further needed.

## 2014-02-08 NOTE — Telephone Encounter (Signed)
The burning in his chest may be related to GERD and changing his Prilosec dosing may affect this. I would have him continue to use the Prilosec every day for a week and then call us to report on his symptoms. He needs to keep his follow-up visit. If her symptoms continue then we will move this up and see him sooner

## 2014-02-15 ENCOUNTER — Other Ambulatory Visit: Payer: Self-pay | Admitting: *Deleted

## 2014-02-15 ENCOUNTER — Encounter: Payer: Self-pay | Admitting: Cardiology

## 2014-02-16 ENCOUNTER — Encounter: Payer: Self-pay | Admitting: Cardiology

## 2014-02-16 ENCOUNTER — Ambulatory Visit (INDEPENDENT_AMBULATORY_CARE_PROVIDER_SITE_OTHER): Payer: Medicare Other | Admitting: Cardiology

## 2014-02-16 VITALS — BP 122/80 | HR 63 | Ht 65.0 in | Wt 179.0 lb

## 2014-02-16 DIAGNOSIS — R079 Chest pain, unspecified: Secondary | ICD-10-CM | POA: Diagnosis not present

## 2014-02-16 DIAGNOSIS — I359 Nonrheumatic aortic valve disorder, unspecified: Secondary | ICD-10-CM

## 2014-02-16 DIAGNOSIS — I119 Hypertensive heart disease without heart failure: Secondary | ICD-10-CM

## 2014-02-16 DIAGNOSIS — C9140 Hairy cell leukemia not having achieved remission: Secondary | ICD-10-CM | POA: Diagnosis not present

## 2014-02-16 NOTE — Progress Notes (Signed)
[name] Date of Birth:  April 20, 1923 [facility] [address] [place], [place]  [address] [phone]        Fax   [phone]   History of Present Illness: This 80 year old married Caucasian gentleman is seen for a work in follow-up office visit. He is a medical patient of Dr. [name]. He has a complex past medical history. He has a history of hairy cell leukemia initially diagnosed in 1980 by Dr. Constance Holster and subsequently followed at [facility]. He has had splenectomy. He has a known pulmonary nodule which has been stable in which has been followed at [facility] and by Dr. Kyung Rudd here in [place].  A PET scan shows that the lesion is not highly active. He has a history of colon cancer in 1984. 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 In 2008 he developed a staph infection of his spine and right hand and required 7 weeks of IV antibiotics with a PICC line under the care of Dr. Megan Salon and Dr. Orene Desanctis. In 2009 he had appendicitis. He has a history of prostate problems with BPH and probable prostate cancer and is followed by urology at [facility]..  The patient underwent an echocardiogram on 01/17/13 which showed normal left ventricular systolic function with an ejection fraction of 60-65%.  There was grade 1 diastolic dysfunction.  There was mild aortic stenosis with peak gradient of 25 and mean gradient of 14. The patient underwent a Lexi scan Myoview stress test on 01/20/13 which showed an ejection fraction of 77% and was felt to be low risk for ischemic heart disease.  The patient is being seen today because of the recent occurrence of exertional burning substernal chest discomfort occurring when he walks.  This is often associated with walking uphill and trying to walk fast.  He has also noted that when he works out with his trainer at [employer] that he has experienced mild chest burning.  He reports that Thanksgiving evening following  dinner he felt extreme fatigue and mild burning in his chest.  He saw Dr. [name] recently who prescribed low-dose metoprolol and sublingual nitroglycerin.  The patient reports that he has not started either of those medicines yet. His family history reveals that his mother died of a stroke at age 73.  His father died of heart failure and coronary artery disease.  The patient has a brother who died at 7 of a heart attack.  Current Outpatient Prescriptions  Medication Sig Dispense Refill  . aspirin 81 MG tablet Take 81 mg by mouth daily.    . Cholecalciferol (VITAMIN D-3 PO) Take by mouth daily.    Marland Kitchen LATANOPROST OP Apply to eye. In each eye    . Multiple Vitamins-Minerals (CENTRUM SILVER ADULT 50+ PO) Take by mouth daily.    . Multiple Vitamins-Minerals (PRESERVISION AREDS PO) Take by mouth daily.    Marland Kitchen omega-3 fish oil (MAXEPA) 1000 MG CAPS capsule Take by mouth daily.    . Omeprazole (PRILOSEC PO) Take by mouth daily.    . sertraline (ZOLOFT) 50 MG tablet Take 50 mg by mouth daily.    . tamsulosin (FLOMAX) 0.4 MG CAPS capsule Take 0.4 mg by mouth daily after supper.    . timolol (BETIMOL) 0.5 % ophthalmic solution Place 1 drop into the right eye 2 (two) times daily.     Marland Kitchen triamterene-hydrochlorothiazide (DYAZIDE) 37.5-25 MG per capsule Take 1 capsule by  mouth every morning.     No current facility-administered medications for this visit.    Allergies  Allergen Reactions  . Antazoline Anaphylaxis  . Antihistamines, Chlorpheniramine-Type     Nervous, jittery  . Sulfa Antibiotics Rash    Patient Active Problem List   Diagnosis Date Noted  . Aortic valve disease 08/10/2013  . Cough 02/11/2013  . Pulmonary nodule, left 02/11/2013  . Lung nodule, solitary 02/11/2013  . Chest pain 01/03/2013  . Dyspnea on exertion 01/03/2013  . Hairy cell leukemia 01/03/2013  . Benign hypertensive heart disease without heart failure 01/03/2013  . Benign hypertensive heart disease 01/03/2013    . Breath shortness 01/03/2013  . H/O malignant neoplasm of skin 01/06/2012  . Glaucoma 11/27/2011  . History of basal cell cancer 10/14/2011  . Cancer of colon 09/12/2011  . Abnormal prostate specific antigen 09/12/2011  . Liposarcoma 09/12/2011  . Lung mass 09/12/2011  . Retinal detachment, traction 11/18/2010  . AMD (age-related macular degeneration), wet 04/16/2010  . 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 Vitreous degeneration 04/16/2010  . INFECTION, STAPHYLOCOCCUS AUREUS 11/30/2006  . Bacterial infection due to Staphylococcus aureus 11/30/2006    History  Smoking status  . Former Smoker -- 0.25 packs/day for 1 years  . Types: Cigarettes, Cigars  Smokeless tobacco  . Never Used    Comment: occasional social smoker during college.    History  Alcohol Use  . 2.5 oz/week  . 5 drink(s) per week    Family History  Problem Relation Age of Onset  . Congestive Heart Failure Father 70  . Diabetes Mellitus II Brother   . Prostate cancer Brother   . Heart Problems Brother     CABG    Review of Systems: Constitutional: no fever chills diaphoresis or fatigue or change in weight.  Head and neck: no hearing loss, no epistaxis, no photophobia or visual disturbance. Respiratory: No cough, shortness of breath or wheezing. Cardiovascular: No chest pain peripheral edema, palpitations. Gastrointestinal: No abdominal distention, no abdominal pain, no change in bowel habits hematochezia or melena. Genitourinary: No dysuria, no frequency, no urgency, no nocturia. Musculoskeletal:No arthralgias, no back pain, no gait disturbance or myalgias. Neurological: No dizziness, no headaches, no numbness, no seizures, no syncope, no weakness, no tremors. Hematologic: No lymphadenopathy, no easy bruising. Psychiatric: No confusion, no hallucinations, no sleep disturbance.    Physical Exam: Filed Vitals:   02/16/14 1054  BP: 122/80  Pulse: 63   the general appearance reveals a  well-developed well-nourished mildly obese gentleman in no acute distress.The head and neck exam reveals pupils equal and reactive.  Extraocular movements are full.  There is no scleral icterus.  The mouth and pharynx are normal.  The neck is supple.  The carotids reveal no bruits.  The jugular venous pressure is normal.  The  thyroid is not enlarged.  There is no lymphadenopathy.  The chest is clear to percussion and auscultation.  There are no rales or rhonchi.  Expansion of the chest is symmetrical.  The precordium is quiet.  The first heart sound is normal.  The second heart sound is physiologically split.  There is grade 2/6 systolic ejection murmur at the base.  No diastolic murmur.  There is no abnormal lift or heave.  The abdomen is soft and nontender.  The bowel sounds are normal.  The liver and spleen are not enlarged.  There are no abdominal masses.  There are no abdominal bruits.  Extremities reveal good pedal pulses.  There is  no phlebitis or edema.  There is no cyanosis or clubbing.  Strength is normal and symmetrical in all extremities.  There is no lateralizing weakness.  There are no sensory deficits.  The skin is warm and dry.  There is no rash.  EKG today shows normal sinus rhythm with right bundle branch block and left anterior fascicular block.  Since 01/03/13, no significant change.  No ischemic changes seen on the resting EKG.   Assessment / Plan: 1.  Diastolic dysfunction with exertional dyspnea and easy fatigue 2. mild aortic valve stenosis 3. history of her cell leukemia 4. essential hypertension 5. BPH 6.  Exertional chest burning concerning for angina pectoris.  His last Myoview stress test was more than a year ago and at that time showed no evidence of ischemia.  Plan: No new medication prescribed.  We will have the patient return soon for a walking Lexiscan Myoview stress test to evaluate his symptoms further. Many thanks for the opportunity to see this pleasant gentleman  with you again.  I will be in touch regarding the results from his stress test.

## 2014-02-16 NOTE — Patient Instructions (Signed)
Your physician has requested that you have a lexiscan myoview. For further information please visit [email or web address]. Please follow instruction sheet, as given.  Your physician recommends that you continue on your current medications as directed. Please refer to the Current Medication list given to you today.  Follow up as needed

## 2014-02-20 ENCOUNTER — Encounter (HOSPITAL_COMMUNITY): Payer: Medicare Other

## 2014-02-28 ENCOUNTER — Ambulatory Visit (HOSPITAL_COMMUNITY): Payer: Medicare Other

## 2014-03-08 ENCOUNTER — Encounter (HOSPITAL_COMMUNITY): Payer: Medicare Other

## 2014-03-08 DIAGNOSIS — H3532 Exudative age-related macular degeneration: Secondary | ICD-10-CM | POA: Diagnosis not present

## 2014-03-08 DIAGNOSIS — H43813 Vitreous degeneration, bilateral: Secondary | ICD-10-CM | POA: Diagnosis not present

## 2014-03-08 DIAGNOSIS — H3341 Traction detachment of retina, right eye: Secondary | ICD-10-CM | POA: Diagnosis not present

## 2014-03-08 DIAGNOSIS — H35412 Lattice degeneration of retina, left eye: Secondary | ICD-10-CM | POA: Diagnosis not present

## 2014-03-10 ENCOUNTER — Ambulatory Visit (HOSPITAL_COMMUNITY): Payer: Medicare Other | Attending: Cardiology | Admitting: Radiology

## 2014-03-10 ENCOUNTER — Ambulatory Visit (INDEPENDENT_AMBULATORY_CARE_PROVIDER_SITE_OTHER): Payer: Medicare Other | Admitting: Cardiology

## 2014-03-10 VITALS — BP 123/71 | Wt 172.0 lb

## 2014-03-10 DIAGNOSIS — R9439 Abnormal result of other cardiovascular function study: Secondary | ICD-10-CM

## 2014-03-10 DIAGNOSIS — I359 Nonrheumatic aortic valve disorder, unspecified: Secondary | ICD-10-CM | POA: Diagnosis not present

## 2014-03-10 DIAGNOSIS — E7800 Pure hypercholesterolemia, unspecified: Secondary | ICD-10-CM

## 2014-03-10 DIAGNOSIS — R079 Chest pain, unspecified: Secondary | ICD-10-CM | POA: Insufficient documentation

## 2014-03-10 DIAGNOSIS — I119 Hypertensive heart disease without heart failure: Secondary | ICD-10-CM | POA: Diagnosis not present

## 2014-03-10 DIAGNOSIS — R931 Abnormal findings on diagnostic imaging of heart and coronary circulation: Secondary | ICD-10-CM

## 2014-03-10 LAB — BASIC METABOLIC PANEL
BUN: 21 mg/dL (ref 6–23)
CO2: 29 mEq/L (ref 19–32)
Calcium: 9 mg/dL (ref 8.4–10.5)
Chloride: 102 mEq/L (ref 96–112)
Creatinine, Ser: 1 mg/dL (ref 0.4–1.5)
GFR: 77.61 mL/min (ref >60.00)
Glucose, Bld: 112 mg/dL — ABNORMAL HIGH (ref 70–99)
Potassium: 3.5 mEq/L (ref 3.5–5.1)
Sodium: 138 mEq/L (ref 135–145)

## 2014-03-10 LAB — CBC WITH DIFFERENTIAL/PLATELET
Basophils Absolute: 0.1 10*3/uL (ref 0.0–0.1)
Basophils Relative: 0.7 % (ref 0.0–3.0)
Eosinophils Absolute: 0.1 10*3/uL (ref 0.0–0.7)
Eosinophils Relative: 1.6 % (ref 0.0–5.0)
HCT: 42.6 % (ref 39.0–52.0)
Hemoglobin: 13.5 g/dL (ref 13.0–17.0)
Lymphocytes Relative: 24.9 % (ref 12.0–46.0)
Lymphs Abs: 1.7 10*3/uL (ref 0.7–4.0)
MCHC: 31.7 g/dL (ref 30.0–36.0)
MCV: 89 fl (ref 78.0–100.0)
Monocytes Absolute: 1.3 10*3/uL — ABNORMAL HIGH (ref 0.1–1.0)
Monocytes Relative: 18.7 % — ABNORMAL HIGH (ref 3.0–12.0)
Neutro Abs: 3.8 10*3/uL (ref 1.4–7.7)
Neutrophils Relative %: 54.1 % (ref 43.0–77.0)
Platelets: 291 10*3/uL (ref 150.0–400.0)
RBC: 4.79 Mil/uL (ref 4.22–5.81)
RDW: 15.2 % (ref 11.5–15.5)
WBC: 6.9 10*3/uL (ref 4.0–10.5)

## 2014-03-10 LAB — PROTIME-INR
INR: 1 ratio (ref 0.8–1.0)
Prothrombin Time: 11.2 s (ref 9.6–13.1)

## 2014-03-10 LAB — APTT: aPTT: 27.8 s (ref 23.4–32.7)

## 2014-03-10 MED ORDER — REGADENOSON 0.4 MG/5ML IV SOLN
0.4000 mg | Freq: Once | INTRAVENOUS | Status: AC
  Administered 2014-03-10: 0.4 mg via INTRAVENOUS

## 2014-03-10 MED ORDER — TECHNETIUM TC 99M SESTAMIBI GENERIC - CARDIOLITE
30.0000 | Freq: Once | INTRAVENOUS | Status: AC | PRN
  Administered 2014-03-10: 30 via INTRAVENOUS

## 2014-03-10 MED ORDER — TECHNETIUM TC 99M SESTAMIBI GENERIC - CARDIOLITE
10.0000 | Freq: Once | INTRAVENOUS | Status: AC | PRN
  Administered 2014-03-10: 10 via INTRAVENOUS

## 2014-03-10 NOTE — Progress Notes (Signed)
[facility] [address] [place], [place] [address] [phone]    Cardiology Nuclear Med Study  [name] is a 81 y.o. male     MRN : [id number]     DOB: Sep 28, 1923  Procedure Date: 03/10/2014  Nuclear Med Background Indication for Stress Test:  Evaluation for Ischemia History:  No prior known history of CAD, and 2014 Myocardial Perfusion Imaging-Normal, EF=77% Cardiac Risk Factors: Hypertension and RBBB  Symptoms: Chest Pain with/without exertion (last occurrence couple weeks ago)  DOE   Nuclear Pre-Procedure Caffeine/Decaff Intake:  None> 12 hrs NPO After: 8:00pm   Lungs:  clear O2 Sat: 96% on room air. IV 0.9% NS with Angio Cath:  22g  IV Site: R Antecubital x 1, tolerated well IV Started by:  Irven Baltimore, RN  Chest Size (in):  40 Cup Size: n/a  Height: 5\' 5"  (1.651 m)  Weight:  172 lb (78.019 kg)  BMI:  Body mass index is 28.62 kg/(m^2). Tech Comments:  No medications this am. Irven Baltimore, RN.    Nuclear Med Study 1 or 2 day study: 1 day  Stress Test Type:  Treadmill/Lexiscan  Reading MD: N/A  Order Authorizing Provider:  Darlin Coco, MD  Resting Radionuclide: Technetium 64m Sestamibi  Resting Radionuclide Dose: 11.0 mCi   Stress Radionuclide:  Technetium 65m Sestamibi  Stress Radionuclide Dose: 33.0 mCi           Stress Protocol Rest HR: 60 Stress HR: 88  Rest BP: 123/71 Stress BP: 128/66  Exercise Time (min): 2:00 METS: n/a   Predicted Max HR: 132 bpm % Max HR: 66.67 bpm Rate Pressure Product: 11264   Dose of Adenosine (mg):  n/a Dose of Lexiscan: 0.4 mg  Dose of Atropine (mg): n/a Dose of Dobutamine: n/a mcg/kg/min (at max HR)  Stress Test Technologist: Irven Baltimore, RN  Nuclear Technologist:  Earl Many, CNMT     Rest Procedure:  Myocardial perfusion imaging was performed at rest 45 minutes following the intravenous administration of Technetium 34m Sestamibi. Rest ECG: NSR-RBBB  Stress Procedure:  The  patient received IV Lexiscan 0.4 mg over 15-seconds with concurrent low level exercise and then Technetium 77m Sestamibi was injected at 30-seconds while the patient continued walking one more minute. The patient complained of SOB,Chest Pressure, heart beating fast, and leg weakness with Lexiscan. Quantitative spect images were obtained after a 45-minute delay. Stress ECG: No significant change from baseline ECG  QPS Raw Data Images:  Normal; no motion artifact; normal heart/lung ratio. Stress Images:  There is decreased uptake in the anterior wall. Rest Images:  Normal homogeneous uptake in all areas of the myocardium. Subtraction (SDS):  These findings are consistent with ischemia. Transient Ischemic Dilatation (Normal <1.22):  0.87 Lung/Heart Ratio (Normal <0.45):  0.39  Quantitative Gated Spect Images QGS EDV:  65 ml QGS ESV:  17 ml  Impression Exercise Capacity:  Lexiscan with low level exercise. BP Response:  Hypotensive blood pressure response. Clinical Symptoms:  Typical chest pain. ECG Impression:  No significant ECG changes with Lexiscan. Comparison with Prior Nuclear Study: prior study in 2014 was negative for ischemia  Overall Impression:  High risk stress nuclear study with a large size, moderate intensity reversible anterior perfusion defect consistent with ischemia. Typical angina was noted along with a hypotensive response to lexiscan.  LV Ejection Fraction: 73%.  LV Wall Motion:  Normal Wall Motion   Pixie Casino, MD, [facility] Board Certified in Nuclear Cardiology Attending  Cardiologist [place]

## 2014-03-10 NOTE — Patient Instructions (Addendum)
Will obtain labs today and call you with the results (cbc/bmet/ptt/pt/inr)  Your physician recommends that you continue on your current medications as directed. Please refer to the Current Medication list given to you today.  Your physician recommends that you schedule a follow-up appointment in: 03/29/14 at 10:00  You are scheduled for a cardiac catheterization on 03/14/14 with Dr. Martinique or associate.  Go to [facility] [place] on 03/14/14 at 8:30 Enter thru the [facility] entrance A No food or drink after midnight on 03/13/14 Take only your Aspirin with a small sip of water morning of procedure   Coronary Angiogram A coronary angiogram, also called coronary angiography, is an X-ray procedure used to look at the arteries in the heart. In this procedure, a dye (contrast dye) is injected through a long, hollow tube (catheter). The catheter is about the size of a piece of cooked spaghetti and is inserted through your groin, wrist, or arm. The dye is injected into each artery, and X-rays are then taken to show if there is a blockage in the arteries of your heart. LET [facility] CARE PROVIDER KNOW ABOUT:  Any allergies you have, including allergies to shellfish or contrast dye.   All medicines you are taking, including vitamins, herbs, eye drops, creams, and over-the-counter medicines.   Previous problems you or members of your family have had with the use of anesthetics.   Any blood disorders you have.   Previous surgeries you have had.  History of kidney problems or failure.   Other medical conditions you have. RISKS AND COMPLICATIONS  Generally, a coronary angiogram is a safe procedure. However, problems can occur and include:  Allergic reaction to the dye.  Bleeding from the access site or other locations.  Kidney injury, especially in people with impaired kidney function.  Stroke (rare).  Heart attack (rare). BEFORE THE PROCEDURE   Do not eat or drink  anything after midnight the night before the procedure or as directed by your health care provider.   Ask your health care provider about changing or stopping your regular medicines. This is especially important if you are taking diabetes medicines or blood thinners. PROCEDURE  You may be given a medicine to help you relax (sedative) before the procedure. This medicine is given through an intravenous (IV) access tube that is inserted into one of your veins.   The area where the catheter will be inserted will be washed and shaved. This is usually done in the groin but may be done in the fold of your arm (near your elbow) or in the wrist.   A medicine will be given to numb the area where the catheter will be inserted (local anesthetic).   The health care provider will insert the catheter into an artery. The catheter will be guided by using a special type of X-ray (fluoroscopy) of the blood vessel being examined.   A special dye will then be injected into the catheter, and X-rays will be taken. The dye will help to show where any narrowing or blockages are located in the heart arteries.  AFTER THE PROCEDURE   If the procedure is done through the leg, you will be kept in bed lying flat for several hours. You will be instructed to not bend or cross your legs.  The insertion site will be checked frequently.   The pulse in your feet or wrist will be checked frequently.   Additional blood tests, X-rays, and an electrocardiogram may be  done.  Document Released: 08/24/2002 Document Revised: 07/04/2013 Document Reviewed: 07/12/2012 [facility] Patient Information 2015 [place], [place]. This information is not intended to replace advice given to you by your health care provider. Make sure you discuss any questions you have with your health care provider.

## 2014-03-10 NOTE — Progress Notes (Addendum)
[name] Date of Birth:  Aug 07, 1923 [facility] [address] [place], [place]  [address] [phone]        Fax   [phone]   History of Present Illness: This 81 year old married Caucasian gentleman is seen for a work in follow-up office visit.  He had an abnormal Myoview stress test earlier today. He is a medical patient of Dr. [name]. He has a complex past medical history. He has a history of hairy cell leukemia initially diagnosed in 1980 by Dr. Constance Holster and subsequently followed at [facility]. He has had splenectomy. He has a known pulmonary nodule which has been stable in which has been followed at [facility] and by Dr. Kyung Rudd here in [place].  A PET scan shows that the lesion is not highly active. He has a history of colon cancer in 1984. 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 In 2008 he developed a staph infection of his spine and right hand and required 7 weeks of IV antibiotics with a PICC line under the care of Dr. Megan Salon and Dr. Orene Desanctis. In 2009 he had appendicitis. He has a history of prostate problems with BPH and probable prostate cancer and is followed by urology at [facility]..  The patient underwent an echocardiogram on 01/17/13 which showed normal left ventricular systolic function with an ejection fraction of 60-65%.  There was grade 1 diastolic dysfunction.  There was mild aortic stenosis with peak gradient of 25 and mean gradient of 14.   The patient was seen as a work in on 02/16/14 because of the recent occurrence of exertional burning substernal chest discomfort occurring when he walks.  This is often associated with walking uphill and trying to walk fast.  He has also noted that when he works out with his trainer at [employer] that he has experienced mild chest burning.  He reports that Thanksgiving evening following dinner he felt extreme fatigue and mild burning in his chest.  He saw Dr. [name]  recently who prescribed low-dose metoprolol and sublingual nitroglycerin.  The patient reports that he has not started either of those medicines yet. His family history reveals that his mother died of a stroke at age 83.  His father died of heart failure and coronary artery disease.  The patient has a brother who died at 35 of a heart attack. The patient had a Myoview stress test today which shows significant reversible inferoapical ischemia.  This was not present on a previous stress test a year ago. Current Outpatient Prescriptions  Medication Sig Dispense Refill  . aspirin 81 MG tablet Take 81 mg by mouth daily.    . Cholecalciferol (VITAMIN D-3 PO) Take by mouth daily.    Marland Kitchen LATANOPROST OP Apply to eye. In each eye    . Multiple Vitamins-Minerals (CENTRUM SILVER ADULT 50+ PO) Take by mouth daily.    . Multiple Vitamins-Minerals (PRESERVISION AREDS PO) Take by mouth daily.    Marland Kitchen omega-3 fish oil (MAXEPA) 1000 MG CAPS capsule Take by mouth daily.    . Omeprazole (PRILOSEC PO) Take by mouth daily.    . sertraline (ZOLOFT) 50 MG tablet Take 50 mg by mouth daily.    . tamsulosin (FLOMAX) 0.4 MG CAPS capsule Take 0.4 mg by mouth daily after supper.    . timolol (BETIMOL) 0.5 % ophthalmic solution Place 1 drop into the right eye 2 (two) times daily.     Marland Kitchen  triamterene-hydrochlorothiazide (DYAZIDE) 37.5-25 MG per capsule Take 1 capsule by mouth every morning.     No current facility-administered medications for this visit.    Allergies  Allergen Reactions  . Antazoline Anaphylaxis  . Antihistamines, Chlorpheniramine-Type     Nervous, jittery  . Sulfa Antibiotics Rash    Patient Active Problem List   Diagnosis Date Noted  . Abnormal nuclear stress test 03/10/2014  . Aortic valve disease 08/10/2013  . Cough 02/11/2013  . Pulmonary nodule, left 02/11/2013  . Lung nodule, solitary 02/11/2013  . Chest pain 01/03/2013  . Dyspnea on exertion 01/03/2013  . Hairy cell leukemia 01/03/2013  .  Benign hypertensive heart disease without heart failure 01/03/2013  . Benign hypertensive heart disease 01/03/2013  . Breath shortness 01/03/2013  . H/O malignant neoplasm of skin 01/06/2012  . Glaucoma 11/27/2011  . History of basal cell cancer 10/14/2011  . Cancer of colon 09/12/2011  . Abnormal prostate specific antigen 09/12/2011  . Liposarcoma 09/12/2011  . Lung mass 09/12/2011  . Retinal detachment, traction 11/18/2010  . AMD (age-related macular degeneration), wet 04/16/2010  . 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 Vitreous degeneration 04/16/2010  . INFECTION, STAPHYLOCOCCUS AUREUS 11/30/2006  . Bacterial infection due to Staphylococcus aureus 11/30/2006    History  Smoking status  . Former Smoker -- 0.25 packs/day for 1 years  . Types: Cigarettes, Cigars  Smokeless tobacco  . Never Used    Comment: occasional social smoker during college.    History  Alcohol Use  . 2.5 oz/week  . 5 drink(s) per week    Family History  Problem Relation Age of Onset  . Congestive Heart Failure Father 35  . Diabetes Mellitus II Brother   . Prostate cancer Brother   . Heart Problems Brother     CABG    Review of Systems: Constitutional: no fever chills diaphoresis or fatigue or change in weight.  Head and neck: no hearing loss, no epistaxis, no photophobia or visual disturbance. Respiratory: No cough, shortness of breath or wheezing. Cardiovascular: No chest pain peripheral edema, palpitations. Gastrointestinal: No abdominal distention, no abdominal pain, no change in bowel habits hematochezia or melena. Genitourinary: No dysuria, no frequency, no urgency, no nocturia. Musculoskeletal:No arthralgias, no back pain, no gait disturbance or myalgias. Neurological: No dizziness, no headaches, no numbness, no seizures, no syncope, no weakness, no tremors. Hematologic: No lymphadenopathy, no easy bruising. Psychiatric: No confusion, no hallucinations, no sleep  disturbance.    Physical Exam: Filed Vitals:   03/10/14 1254  BP: 123/71   the general appearance reveals a well-developed well-nourished mildly obese gentleman in no acute distress.The head and neck exam reveals pupils equal and reactive.  Extraocular movements are full.  There is no scleral icterus.  The mouth and pharynx are normal.  The neck is supple.  The carotids reveal no bruits.  The jugular venous pressure is normal.  The  thyroid is not enlarged.  There is no lymphadenopathy.  The chest is clear to percussion and auscultation.  There are no rales or rhonchi.  Expansion of the chest is symmetrical.  The precordium is quiet.  The first heart sound is normal.  The second heart sound is physiologically split.  There is grade 2/6 systolic ejection murmur at the base.  No diastolic murmur.  There is no abnormal lift or heave.  The abdomen is soft and nontender.  The bowel sounds are normal.  The liver and spleen are not enlarged.  There are no abdominal masses.  There  are no abdominal bruits.  Extremities reveal good pedal pulses.  There is no phlebitis or edema.  There is no cyanosis or clubbing.  Strength is normal and symmetrical in all extremities.  There is no lateralizing weakness.  There are no sensory deficits.  The skin is warm and dry.  There is no rash.    Assessment / Plan: 1.  Diastolic dysfunction with exertional dyspnea and easy fatigue 2. mild aortic valve stenosis 3. history of hairy cell leukemia 4. essential hypertension 5. BPH 6.  Abnormal Myoview stress test showing reversible inferoapical ischemia.  Recent exertional burning chest discomfort consistent with angina pectoris  Plan: The results of the nuclear stress test were discussed with the patient.  The reversible inferoapical defect is consistent with his clinical history suggestive of angina pectoris.  We are checking baseline lab work today to be sure that his kidney function is stable.  He is being scheduled for  left heart catheterization as an outpatient on Tuesday, January 12.  By Dr. Peter Martinique.  Patient will arrive at 8:30 AM for a 10:30 case.

## 2014-03-13 ENCOUNTER — Telehealth: Payer: Self-pay | Admitting: Cardiology

## 2014-03-13 DIAGNOSIS — Z6829 Body mass index (BMI) 29.0-29.9, adult: Secondary | ICD-10-CM | POA: Diagnosis not present

## 2014-03-13 DIAGNOSIS — R0609 Other forms of dyspnea: Secondary | ICD-10-CM | POA: Diagnosis not present

## 2014-03-13 DIAGNOSIS — I10 Essential (primary) hypertension: Secondary | ICD-10-CM | POA: Diagnosis not present

## 2014-03-13 DIAGNOSIS — H6123 Impacted cerumen, bilateral: Secondary | ICD-10-CM | POA: Diagnosis not present

## 2014-03-13 DIAGNOSIS — J309 Allergic rhinitis, unspecified: Secondary | ICD-10-CM | POA: Diagnosis not present

## 2014-03-13 NOTE — Telephone Encounter (Signed)
Spoke with patient regarding labs, ok for cath

## 2014-03-13 NOTE — Telephone Encounter (Signed)
New Msg         Pt requesting call back. No details left by pt.    Please call.

## 2014-03-14 ENCOUNTER — Encounter (HOSPITAL_COMMUNITY): Payer: Self-pay | Admitting: General Practice

## 2014-03-14 ENCOUNTER — Ambulatory Visit (HOSPITAL_COMMUNITY)
Admission: RE | Admit: 2014-03-14 | Discharge: 2014-03-15 | Disposition: A | Discharge status: Home / Self Care | Payer: Medicare Other | Source: Ambulatory Visit | Attending: Cardiology | Admitting: Cardiology

## 2014-03-14 ENCOUNTER — Encounter (HOSPITAL_COMMUNITY)
Admission: RE | Disposition: A | Discharge status: Home / Self Care | Payer: Medicare Other | Source: Ambulatory Visit | Attending: Cardiology

## 2014-03-14 DIAGNOSIS — C9140 Hairy cell leukemia not having achieved remission: Secondary | ICD-10-CM | POA: Diagnosis present

## 2014-03-14 DIAGNOSIS — R079 Chest pain, unspecified: Secondary | ICD-10-CM

## 2014-03-14 DIAGNOSIS — E876 Hypokalemia: Secondary | ICD-10-CM | POA: Diagnosis not present

## 2014-03-14 DIAGNOSIS — Z823 Family history of stroke: Secondary | ICD-10-CM | POA: Diagnosis not present

## 2014-03-14 DIAGNOSIS — R9439 Abnormal result of other cardiovascular function study: Secondary | ICD-10-CM | POA: Diagnosis present

## 2014-03-14 DIAGNOSIS — E785 Hyperlipidemia, unspecified: Secondary | ICD-10-CM | POA: Diagnosis present

## 2014-03-14 DIAGNOSIS — Z7982 Long term (current) use of aspirin: Secondary | ICD-10-CM | POA: Insufficient documentation

## 2014-03-14 DIAGNOSIS — Z955 Presence of coronary angioplasty implant and graft: Secondary | ICD-10-CM

## 2014-03-14 DIAGNOSIS — Z87891 Personal history of nicotine dependence: Secondary | ICD-10-CM | POA: Insufficient documentation

## 2014-03-14 DIAGNOSIS — K219 Gastro-esophageal reflux disease without esophagitis: Secondary | ICD-10-CM | POA: Diagnosis not present

## 2014-03-14 DIAGNOSIS — I350 Nonrheumatic aortic (valve) stenosis: Secondary | ICD-10-CM | POA: Insufficient documentation

## 2014-03-14 DIAGNOSIS — I359 Nonrheumatic aortic valve disorder, unspecified: Secondary | ICD-10-CM

## 2014-03-14 DIAGNOSIS — R911 Solitary pulmonary nodule: Secondary | ICD-10-CM | POA: Diagnosis not present

## 2014-03-14 DIAGNOSIS — I25119 Atherosclerotic heart disease of native coronary artery with unspecified angina pectoris: Secondary | ICD-10-CM | POA: Insufficient documentation

## 2014-03-14 DIAGNOSIS — Z856 Personal history of leukemia: Secondary | ICD-10-CM | POA: Diagnosis not present

## 2014-03-14 DIAGNOSIS — C499 Malignant neoplasm of connective and soft tissue, unspecified: Secondary | ICD-10-CM | POA: Diagnosis present

## 2014-03-14 DIAGNOSIS — I2582 Chronic total occlusion of coronary artery: Secondary | ICD-10-CM | POA: Insufficient documentation

## 2014-03-14 DIAGNOSIS — I2510 Atherosclerotic heart disease of native coronary artery without angina pectoris: Secondary | ICD-10-CM | POA: Diagnosis present

## 2014-03-14 DIAGNOSIS — I209 Angina pectoris, unspecified: Secondary | ICD-10-CM

## 2014-03-14 DIAGNOSIS — N400 Enlarged prostate without lower urinary tract symptoms: Secondary | ICD-10-CM | POA: Diagnosis not present

## 2014-03-14 DIAGNOSIS — Z85038 Personal history of other malignant neoplasm of large intestine: Secondary | ICD-10-CM | POA: Diagnosis not present

## 2014-03-14 DIAGNOSIS — C189 Malignant neoplasm of colon, unspecified: Secondary | ICD-10-CM | POA: Diagnosis present

## 2014-03-14 DIAGNOSIS — Z85828 Personal history of other malignant neoplasm of skin: Secondary | ICD-10-CM | POA: Insufficient documentation

## 2014-03-14 DIAGNOSIS — I10 Essential (primary) hypertension: Secondary | ICD-10-CM | POA: Diagnosis not present

## 2014-03-14 DIAGNOSIS — E7800 Pure hypercholesterolemia, unspecified: Secondary | ICD-10-CM

## 2014-03-14 HISTORY — DX: Unspecified osteoarthritis, unspecified site: M19.90

## 2014-03-14 HISTORY — DX: Anxiety disorder, unspecified: F41.9

## 2014-03-14 HISTORY — DX: Personal history of other diseases of the digestive system: Z87.19

## 2014-03-14 HISTORY — PX: CORONARY ANGIOPLASTY WITH STENT PLACEMENT: SHX49

## 2014-03-14 HISTORY — DX: Personal history of other medical treatment: Z92.89

## 2014-03-14 HISTORY — DX: Unspecified malignant neoplasm of skin of unspecified part of face: C44.300

## 2014-03-14 HISTORY — PX: LEFT HEART CATHETERIZATION WITH CORONARY ANGIOGRAM: SHX5451

## 2014-03-14 LAB — POCT ACTIVATED CLOTTING TIME: Activated Clotting Time: 601 seconds

## 2014-03-14 SURGERY — LEFT HEART CATHETERIZATION WITH CORONARY ANGIOGRAM
Anesthesia: LOCAL

## 2014-03-14 MED ORDER — HEPARIN (PORCINE) IN NACL 2-0.9 UNIT/ML-% IJ SOLN
INTRAMUSCULAR | Status: AC
  Filled 2014-03-14: qty 1000

## 2014-03-14 MED ORDER — HEPARIN SODIUM (PORCINE) 1000 UNIT/ML IJ SOLN
INTRAMUSCULAR | Status: AC
  Filled 2014-03-14: qty 1

## 2014-03-14 MED ORDER — ACETAMINOPHEN 325 MG PO TABS
650.0000 mg | ORAL_TABLET | ORAL | Status: DC | PRN
  Administered 2014-03-15: 02:00:00 650 mg via ORAL
  Filled 2014-03-14: qty 2

## 2014-03-14 MED ORDER — SODIUM CHLORIDE 0.9 % IJ SOLN: 3.0000 mL | Freq: Two times a day (BID) | INTRAMUSCULAR | Status: DC

## 2014-03-14 MED ORDER — LATANOPROST 0.005 % OP SOLN
1.0000 [drp] | Freq: Every day | OPHTHALMIC | Status: DC
  Filled 2014-03-14 (×2): qty 2.5

## 2014-03-14 MED ORDER — CLOPIDOGREL BISULFATE 300 MG PO TABS
ORAL_TABLET | ORAL | Status: AC
  Filled 2014-03-14: qty 2

## 2014-03-14 MED ORDER — LIDOCAINE HCL (PF) 1 % IJ SOLN
INTRAMUSCULAR | Status: AC
  Filled 2014-03-14: qty 30

## 2014-03-14 MED ORDER — TIMOLOL MALEATE 0.5 % OP SOLN
1.0000 [drp] | Freq: Every day | OPHTHALMIC | Status: DC
  Administered 2014-03-14 – 2014-03-15 (×3): 1 [drp] via OPHTHALMIC
  Filled 2014-03-14: qty 5

## 2014-03-14 MED ORDER — TAMSULOSIN HCL 0.4 MG PO CAPS
0.8000 mg | ORAL_CAPSULE | Freq: Every day | ORAL | Status: DC
  Administered 2014-03-14: 0.8 mg via ORAL
  Filled 2014-03-14 (×4): qty 2

## 2014-03-14 MED ORDER — BIVALIRUDIN 250 MG IV SOLR
INTRAVENOUS | Status: AC
  Filled 2014-03-14: qty 250

## 2014-03-14 MED ORDER — NITROGLYCERIN 1 MG/10 ML FOR IR/CATH LAB
INTRA_ARTERIAL | Status: DC
Start: 2014-03-14 — End: 2014-03-14
  Filled 2014-03-14: qty 10

## 2014-03-14 MED ORDER — SERTRALINE HCL 50 MG PO TABS
50.0000 mg | ORAL_TABLET | Freq: Every day | ORAL | Status: DC
  Administered 2014-03-14: 50 mg via ORAL
  Filled 2014-03-14 (×3): qty 1

## 2014-03-14 MED ORDER — TRIAMTERENE-HCTZ 37.5-25 MG PO TABS
1.0000 | ORAL_TABLET | ORAL | Status: DC
  Filled 2014-03-14 (×2): qty 1

## 2014-03-14 MED ORDER — CLOPIDOGREL BISULFATE 75 MG PO TABS
75.0000 mg | ORAL_TABLET | Freq: Every day | ORAL | Status: DC
  Administered 2014-03-15: 75 mg via ORAL
  Filled 2014-03-14: qty 1

## 2014-03-14 MED ORDER — TRIAMTERENE-HCTZ 37.5-25 MG PO CAPS
1.0000 | ORAL_CAPSULE | ORAL | Status: DC
  Filled 2014-03-14: qty 1

## 2014-03-14 MED ORDER — SODIUM CHLORIDE 0.9 % IV SOLN: 1.0000 mL/kg/h | INTRAVENOUS | Status: AC

## 2014-03-14 MED ORDER — ONDANSETRON HCL 4 MG/2ML IJ SOLN: 4.0000 mg | Freq: Four times a day (QID) | INTRAMUSCULAR | Status: DC | PRN

## 2014-03-14 MED ORDER — ATORVASTATIN CALCIUM 20 MG PO TABS
20.0000 mg | ORAL_TABLET | Freq: Every day | ORAL | Status: DC
  Administered 2014-03-14: 20 mg via ORAL
  Filled 2014-03-14 (×2): qty 1

## 2014-03-14 MED ORDER — VERAPAMIL HCL 2.5 MG/ML IV SOLN
INTRAVENOUS | Status: AC
  Filled 2014-03-14: qty 2

## 2014-03-14 MED ORDER — FENTANYL CITRATE 0.05 MG/ML IJ SOLN
INTRAMUSCULAR | Status: AC
  Filled 2014-03-14: qty 2

## 2014-03-14 MED ORDER — ISOSORBIDE MONONITRATE 15 MG HALF TABLET
15.0000 mg | ORAL_TABLET | Freq: Every day | ORAL | Status: DC
  Administered 2014-03-14 – 2014-03-15 (×2): 15 mg via ORAL
  Filled 2014-03-14 (×2): qty 1

## 2014-03-14 MED ORDER — SODIUM CHLORIDE 0.9 % IV SOLN: 250.0000 mL | INTRAVENOUS | Status: DC | PRN

## 2014-03-14 MED ORDER — ASPIRIN 81 MG PO CHEW
81.0000 mg | CHEWABLE_TABLET | Freq: Every day | ORAL | Status: DC
  Administered 2014-03-15: 10:00:00 81 mg via ORAL
  Filled 2014-03-14 (×2): qty 1

## 2014-03-14 MED ORDER — NITROGLYCERIN 1 MG/10 ML FOR IR/CATH LAB
INTRA_ARTERIAL | Status: AC
  Filled 2014-03-14: qty 10

## 2014-03-14 MED ORDER — METOPROLOL TARTRATE 12.5 MG HALF TABLET
12.5000 mg | ORAL_TABLET | Freq: Two times a day (BID) | ORAL | Status: DC
  Administered 2014-03-14 – 2014-03-15 (×2): 12.5 mg via ORAL
  Filled 2014-03-14 (×4): qty 1

## 2014-03-14 MED ORDER — PANTOPRAZOLE SODIUM 40 MG PO TBEC
40.0000 mg | DELAYED_RELEASE_TABLET | Freq: Every day | ORAL | Status: DC
  Administered 2014-03-14 – 2014-03-15 (×2): 40 mg via ORAL
  Filled 2014-03-14 (×2): qty 1

## 2014-03-14 MED ORDER — NITROGLYCERIN 0.4 MG SL SUBL: 0.4000 mg | SUBLINGUAL_TABLET | SUBLINGUAL | Status: DC | PRN

## 2014-03-14 MED ORDER — SODIUM CHLORIDE 0.9 % IJ SOLN: 3.0000 mL | INTRAMUSCULAR | Status: DC | PRN

## 2014-03-14 MED ORDER — ASPIRIN 81 MG PO CHEW: 81.0000 mg | CHEWABLE_TABLET | ORAL | Status: DC

## 2014-03-14 MED ORDER — MIDAZOLAM HCL 2 MG/2ML IJ SOLN
INTRAMUSCULAR | Status: AC
  Filled 2014-03-14: qty 2

## 2014-03-14 NOTE — CV Procedure (Signed)
    Cardiac Catheterization Procedure Note  Name: [name] MRN: [id number] DOB: 11/26/1923  Procedure: Left Heart Cath, Selective Coronary Angiography, LV angiography, PTCA and stenting of the mid and distal RCA  Indication: 81 yo WM presents with progressive angina. Myoview study demonstrates inferior and apical ischemia.   Procedural Details:  The right wrist was prepped, draped, and anesthetized with 1% lidocaine. Using the modified Seldinger technique, a 6 French slender sheath was introduced into the right radial artery. 3 mg of verapamil was administered through the sheath, weight-based unfractionated heparin was administered intravenously. Standard Judkins catheters were used for selective coronary angiography and left ventriculography. Catheter exchanges were performed over an exchange length guidewire.  PROCEDURAL FINDINGS Hemodynamics: AO 129/62 mean 90 mm Hg LV 138/19 mm Hg   Coronary angiography: Coronary dominance: right  Left mainstem: Moderately calcified without significant disease.   Left anterior descending (LAD): The LAD has moderate calcification in the proximal and mid vessel. Immediately after the second diagonal there is a focal 95% stenosis. Following the third diagonal the LAD is occluded. It fills by left to left collaterals.   Ramus intermediate: this is a very large branch with 30% stenosis in the proximal vessel.   Left circumflex (LCx): this is a relatively small branch without significant disease.   Right coronary artery (RCA): The RCA is small and diffusely diseased. The mid vessel is calcified with a 99% stenosis and TIMI 1 flow. The distal RCA branches fill by left to right collaterals.   Left ventriculography: Left ventricular systolic function is normal, LVEF is estimated at 65%, there is no significant mitral regurgitation   PCI Note:  Following the diagnostic procedure, the decision was made to proceed with PCI of the RCA.  Weight-based  bivalirudin was given for anticoagulation. Plavix 600 mg was given orally.  Once a therapeutic ACT was achieved, a 6 [place] FR4 guide catheter was inserted.  A prowater coronary guidewire was used to cross the lesion.  The lesion was predilated with a 2.0 mm balloon. After better perfusion was improved there was a lesion noted in the distal RCA as well.  This lesion was then stented with a 2.25 x 16 mm Promus stent.  The stent was postdilated with a 2.5 mm noncompliant balloon.  The mid vessel lesion was then stented with a 2.5 x 20 mm Promus stent. This was post dilated with a 2.5 mm noncompliant balloon. Following PCI, there was 0% residual stenosis and TIMI-3 flow. Final angiography confirmed an excellent result. The patient tolerated the procedure well. There were no immediate procedural complications. A TR band was used for radial hemostasis. The patient was transferred to the post catheterization recovery area for further monitoring.  PCI Data: Vessel - RCA/Segment - mid and distal Percent Stenosis (pre)  99% and 80% TIMI-flow 1 Stent 2.5 x 20 mm Promus and 2.25 x 16 mm Promus respectively Percent Stenosis (post) 0% TIMI-flow (post) 3  Final Conclusions:   1. Severe 2 vessel obstructive CAD. The patient has chronic total occlusion of the distal LAD with collaterals. There is a severe lesion in the LAD after the second diagonal but this lesion supplies only a small third diagonal branch. The mid RCA had a subtotal occlusion. 2. Normal LV function.  Recommendations:  Continue DAPT for one year. We will treat residual disease in LAD medically. Will add low dose metoprolol and isosorbide. Anticipate DC in am.   [name], [place] 03/14/2014, 12:34 PM

## 2014-03-14 NOTE — Progress Notes (Signed)
TR BAND REMOVAL  LOCATION:  right radial  DEFLATED PER PROTOCOL:  Yes.    TIME BAND OFF / DRESSING APPLIED:   1830   SITE UPON ARRIVAL:   Level 0  SITE AFTER BAND REMOVAL:  Level 0  REVERSE ALLEN'S TEST:    positive  CIRCULATION SENSATION AND MOVEMENT:  Within Normal Limits  Yes.    COMMENTS:

## 2014-03-14 NOTE — H&P (View-Only) (Signed)
[name] Date of Birth:  14-Jul-1923 [facility] [address] [employer], [place]  [address] [phone]        Fax   [phone]   History of Present Illness: This 81 year old married Caucasian gentleman is seen for a work in follow-up office visit.  He had an abnormal Myoview stress test earlier today. He is a medical patient of Dr. [name]. He has a complex past medical history. He has a history of hairy cell leukemia initially diagnosed in 1980 by Dr. Constance Holster and subsequently followed at [facility]. He has had splenectomy. He has a known pulmonary nodule which has been stable in which has been followed at [facility] and by Dr. Kyung Rudd here in [place].  A PET scan shows that the lesion is not highly active. He has a history of colon cancer in 1984. 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 In 2008 he developed a staph infection of his spine and right hand and required 7 weeks of IV antibiotics with a PICC line under the care of Dr. Megan Salon and Dr. Orene Desanctis. In 2009 he had appendicitis. He has a history of prostate problems with BPH and probable prostate cancer and is followed by urology at [facility]..  The patient underwent an echocardiogram on 01/17/13 which showed normal left ventricular systolic function with an ejection fraction of 60-65%.  There was grade 1 diastolic dysfunction.  There was mild aortic stenosis with peak gradient of 25 and mean gradient of 14.   The patient was seen as a work in on 02/16/14 because of the recent occurrence of exertional burning substernal chest discomfort occurring when he walks.  This is often associated with walking uphill and trying to walk fast.  He has also noted that when he works out with his trainer at [employer] that he has experienced mild chest burning.  He reports that Thanksgiving evening following dinner he felt extreme fatigue and mild burning in his chest.  He saw Dr. [name]  recently who prescribed low-dose metoprolol and sublingual nitroglycerin.  The patient reports that he has not started either of those medicines yet. His family history reveals that his mother died of a stroke at age 83.  His father died of heart failure and coronary artery disease.  The patient has a brother who died at 37 of a heart attack. The patient had a Myoview stress test today which shows significant reversible inferoapical ischemia.  This was not present on a previous stress test a year ago. Current Outpatient Prescriptions  Medication Sig Dispense Refill  . aspirin 81 MG tablet Take 81 mg by mouth daily.    . Cholecalciferol (VITAMIN D-3 PO) Take by mouth daily.    Marland Kitchen LATANOPROST OP Apply to eye. In each eye    . Multiple Vitamins-Minerals (CENTRUM SILVER ADULT 50+ PO) Take by mouth daily.    . Multiple Vitamins-Minerals (PRESERVISION AREDS PO) Take by mouth daily.    Marland Kitchen omega-3 fish oil (MAXEPA) 1000 MG CAPS capsule Take by mouth daily.    . Omeprazole (PRILOSEC PO) Take by mouth daily.    . sertraline (ZOLOFT) 50 MG tablet Take 50 mg by mouth daily.    . tamsulosin (FLOMAX) 0.4 MG CAPS capsule Take 0.4 mg by mouth daily after supper.    . timolol (BETIMOL) 0.5 % ophthalmic solution Place 1 drop into the right eye 2 (two) times daily.     Marland Kitchen  triamterene-hydrochlorothiazide (DYAZIDE) 37.5-25 MG per capsule Take 1 capsule by mouth every morning.     No current facility-administered medications for this visit.    Allergies  Allergen Reactions  . Antazoline Anaphylaxis  . Antihistamines, Chlorpheniramine-Type     Nervous, jittery  . Sulfa Antibiotics Rash    Patient Active Problem List   Diagnosis Date Noted  . Abnormal nuclear stress test 03/10/2014  . Aortic valve disease 08/10/2013  . Cough 02/11/2013  . Pulmonary nodule, left 02/11/2013  . Lung nodule, solitary 02/11/2013  . Chest pain 01/03/2013  . Dyspnea on exertion 01/03/2013  . Hairy cell leukemia 01/03/2013  .  Benign hypertensive heart disease without heart failure 01/03/2013  . Benign hypertensive heart disease 01/03/2013  . Breath shortness 01/03/2013  . H/O malignant neoplasm of skin 01/06/2012  . Glaucoma 11/27/2011  . History of basal cell cancer 10/14/2011  . Cancer of colon 09/12/2011  . Abnormal prostate specific antigen 09/12/2011  . Liposarcoma 09/12/2011  . Lung mass 09/12/2011  . Retinal detachment, traction 11/18/2010  . AMD (age-related macular degeneration), wet 04/16/2010  . 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 Vitreous degeneration 04/16/2010  . INFECTION, STAPHYLOCOCCUS AUREUS 11/30/2006  . Bacterial infection due to Staphylococcus aureus 11/30/2006    History  Smoking status  . Former Smoker -- 0.25 packs/day for 1 years  . Types: Cigarettes, Cigars  Smokeless tobacco  . Never Used    Comment: occasional social smoker during college.    History  Alcohol Use  . 2.5 oz/week  . 5 drink(s) per week    Family History  Problem Relation Age of Onset  . Congestive Heart Failure Father 69  . Diabetes Mellitus II Brother   . Prostate cancer Brother   . Heart Problems Brother     CABG    Review of Systems: Constitutional: no fever chills diaphoresis or fatigue or change in weight.  Head and neck: no hearing loss, no epistaxis, no photophobia or visual disturbance. Respiratory: No cough, shortness of breath or wheezing. Cardiovascular: No chest pain peripheral edema, palpitations. Gastrointestinal: No abdominal distention, no abdominal pain, no change in bowel habits hematochezia or melena. Genitourinary: No dysuria, no frequency, no urgency, no nocturia. Musculoskeletal:No arthralgias, no back pain, no gait disturbance or myalgias. Neurological: No dizziness, no headaches, no numbness, no seizures, no syncope, no weakness, no tremors. Hematologic: No lymphadenopathy, no easy bruising. Psychiatric: No confusion, no hallucinations, no sleep  disturbance.    Physical Exam: Filed Vitals:   03/10/14 1254  BP: 123/71   the general appearance reveals a well-developed well-nourished mildly obese gentleman in no acute distress.The head and neck exam reveals pupils equal and reactive.  Extraocular movements are full.  There is no scleral icterus.  The mouth and pharynx are normal.  The neck is supple.  The carotids reveal no bruits.  The jugular venous pressure is normal.  The  thyroid is not enlarged.  There is no lymphadenopathy.  The chest is clear to percussion and auscultation.  There are no rales or rhonchi.  Expansion of the chest is symmetrical.  The precordium is quiet.  The first heart sound is normal.  The second heart sound is physiologically split.  There is grade 2/6 systolic ejection murmur at the base.  No diastolic murmur.  There is no abnormal lift or heave.  The abdomen is soft and nontender.  The bowel sounds are normal.  The liver and spleen are not enlarged.  There are no abdominal masses.  There  are no abdominal bruits.  Extremities reveal good pedal pulses.  There is no phlebitis or edema.  There is no cyanosis or clubbing.  Strength is normal and symmetrical in all extremities.  There is no lateralizing weakness.  There are no sensory deficits.  The skin is warm and dry.  There is no rash.    Assessment / Plan: 1.  Diastolic dysfunction with exertional dyspnea and easy fatigue 2. mild aortic valve stenosis 3. history of hairy cell leukemia 4. essential hypertension 5. BPH 6.  Abnormal Myoview stress test showing reversible inferoapical ischemia.  Recent exertional burning chest discomfort consistent with angina pectoris  Plan: The results of the nuclear stress test were discussed with the patient.  The reversible inferoapical defect is consistent with his clinical history suggestive of angina pectoris.  We are checking baseline lab work today to be sure that his kidney function is stable.  He is being scheduled for  left heart catheterization as an outpatient on Tuesday, January 12.  By Dr. Peter Martinique.  Patient will arrive at 8:30 AM for a 10:30 case.

## 2014-03-14 NOTE — Interval H&P Note (Signed)
History and Physical Interval Note:  03/14/2014 11:17 AM  [name]  has presented today for surgery, with the diagnosis of abnormal nuclear stress test  The various methods of treatment have been discussed with the patient and family. After consideration of risks, benefits and other options for treatment, the patient has consented to  Procedure(s): LEFT HEART CATHETERIZATION WITH CORONARY ANGIOGRAM (N/A) as a surgical intervention .  The patient's history has been reviewed, patient examined, no change in status, stable for surgery.  I have reviewed the patient's chart and labs.  Questions were answered to the patient's satisfaction.   Cath Lab Visit (complete for each Cath Lab visit)  Clinical Evaluation Leading to the Procedure:   ACS: No.  Non-ACS:    Anginal Classification: CCS III  Anti-ischemic medical therapy: Minimal Therapy (1 class of medications)  Non-Invasive Test Results: Intermediate-risk stress test findings: cardiac mortality 1-3%/year  Prior CABG: No previous CABG        [name] [facility] 03/14/2014 11:17 AM

## 2014-03-15 ENCOUNTER — Encounter (HOSPITAL_COMMUNITY): Payer: Self-pay | Admitting: Physician Assistant

## 2014-03-15 DIAGNOSIS — I25119 Atherosclerotic heart disease of native coronary artery with unspecified angina pectoris: Secondary | ICD-10-CM | POA: Diagnosis not present

## 2014-03-15 DIAGNOSIS — I10 Essential (primary) hypertension: Secondary | ICD-10-CM | POA: Diagnosis not present

## 2014-03-15 DIAGNOSIS — I2582 Chronic total occlusion of coronary artery: Secondary | ICD-10-CM | POA: Diagnosis not present

## 2014-03-15 DIAGNOSIS — K219 Gastro-esophageal reflux disease without esophagitis: Secondary | ICD-10-CM | POA: Diagnosis not present

## 2014-03-15 DIAGNOSIS — C189 Malignant neoplasm of colon, unspecified: Secondary | ICD-10-CM | POA: Diagnosis present

## 2014-03-15 DIAGNOSIS — R911 Solitary pulmonary nodule: Secondary | ICD-10-CM | POA: Diagnosis not present

## 2014-03-15 DIAGNOSIS — I2510 Atherosclerotic heart disease of native coronary artery without angina pectoris: Secondary | ICD-10-CM | POA: Diagnosis present

## 2014-03-15 DIAGNOSIS — E785 Hyperlipidemia, unspecified: Secondary | ICD-10-CM | POA: Diagnosis present

## 2014-03-15 DIAGNOSIS — R9439 Abnormal result of other cardiovascular function study: Secondary | ICD-10-CM | POA: Diagnosis not present

## 2014-03-15 LAB — CBC
HCT: 35.5 % — ABNORMAL LOW (ref 39.0–52.0)
Hemoglobin: 11.9 g/dL — ABNORMAL LOW (ref 13.0–17.0)
MCH: 28.3 pg (ref 26.0–34.0)
MCHC: 33.5 g/dL (ref 30.0–36.0)
MCV: 84.5 fL (ref 78.0–100.0)
Platelets: 219 10*3/uL (ref 150–400)
RBC: 4.2 MIL/uL — ABNORMAL LOW (ref 4.22–5.81)
RDW: 15.7 % — ABNORMAL HIGH (ref 11.5–15.5)
WBC: 5.8 10*3/uL (ref 4.0–10.5)

## 2014-03-15 LAB — BASIC METABOLIC PANEL
Anion gap: 7 (ref 5–15)
BUN: 18 mg/dL (ref 6–23)
CO2: 24 mmol/L (ref 19–32)
Calcium: 8.1 mg/dL — ABNORMAL LOW (ref 8.4–10.5)
Chloride: 104 mEq/L (ref 96–112)
Creatinine, Ser: 1.09 mg/dL (ref 0.50–1.35)
GFR calc Af Amer: 68 mL/min — ABNORMAL LOW (ref >90)
GFR calc non Af Amer: 59 mL/min — ABNORMAL LOW (ref >90)
Glucose, Bld: 97 mg/dL (ref 70–99)
Potassium: 3.3 mmol/L — ABNORMAL LOW (ref 3.5–5.1)
Sodium: 135 mmol/L (ref 135–145)

## 2014-03-15 MED ORDER — PANTOPRAZOLE SODIUM 40 MG PO TBEC: 40.0000 mg | DELAYED_RELEASE_TABLET | Freq: Every day | ORAL | Status: DC

## 2014-03-15 MED ORDER — ISOSORBIDE MONONITRATE ER 30 MG PO TB24: 15.0000 mg | ORAL_TABLET | Freq: Every day | ORAL | Status: DC

## 2014-03-15 MED ORDER — CLOPIDOGREL BISULFATE 75 MG PO TABS: 75.0000 mg | ORAL_TABLET | Freq: Every day | ORAL | Status: DC

## 2014-03-15 MED ORDER — ATORVASTATIN CALCIUM 20 MG PO TABS: 20.0000 mg | ORAL_TABLET | Freq: Every day | ORAL | Status: DC

## 2014-03-15 MED ORDER — METOPROLOL TARTRATE 25 MG PO TABS: 12.5000 mg | ORAL_TABLET | Freq: Two times a day (BID) | ORAL | Status: DC

## 2014-03-15 MED ORDER — POTASSIUM CHLORIDE CRYS ER 20 MEQ PO TBCR
40.0000 meq | EXTENDED_RELEASE_TABLET | Freq: Once | ORAL | Status: AC
  Administered 2014-03-15: 40 meq via ORAL
  Filled 2014-03-15: qty 2

## 2014-03-15 MED FILL — Sodium Chloride IV Soln 0.9%: INTRAVENOUS | Qty: 50 | Status: AC

## 2014-03-15 NOTE — Discharge Instructions (Signed)

## 2014-03-15 NOTE — Progress Notes (Signed)
CARDIAC REHAB PHASE I   PRE:  Rate/Rhythm: 68 SR  BP:  Supine:   Sitting: 96/63  Standing:    SaO2:   MODE:  Ambulation: 1000 ft   POST:  Rate/Rhythm: 85 SR  BP:  Supine:   Sitting: 119/60  Standing:    SaO2:  0800-0930 Pt tolerates ambulation well without c/o of cp. VS stable Pt to recliner after walk. Completed discharge education with pt, wife and daughter. They voice understanding. Pt agrees to [place]. CRP in [place], will send referral.  Rodney Langton RN 03/15/2014 9:29 AM

## 2014-03-15 NOTE — Discharge Summary (Signed)
Discharge Summary   Patient ID: [name] MRN: [id number], DOB/AGE: 04/18/1923 81 y.o. Admit date: 03/14/2014 D/C date:     03/15/2014  Primary Cardiologist: Dr. Mare Ferrari   Principal Problem:   Abnormal nuclear stress test Active Problems:   Hairy cell leukemia   Pulmonary nodule, left   Liposarcoma   Angina pectoris   CAD (coronary artery disease)   GERD (gastroesophageal reflux disease)   Hypertension   Dyslipidemia   Colon cancer   Admission Dates: 03/14/14-03/15/14 Discharge Diagnosis: chest pain/abnormal NST admitted for outpatient LHC and now s/p overlapping DES x2 to mid-distal RCA.  HPI: [name] is a 81 y.o. 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     Hospital Course  CAD/USA- seen by Dr. Mare Ferrari on 02/16/14 for evaluation of new chest pain.  -- s/p out myoview stress test on 03/10/14 which showed significant reversible inferoapical ischemia that was not present on a previous stress test a year ago. -- s/p LHC yesterday which revealed  1. Severe 2 vessel obstructive CAD. The patient has CTO of the dLAD with collaterals. There is a severe lesion in the LAD after the second diagonal but this lesion supplies only a small third diagonal branch. The mid RCA had a subtotal occlusion. 2. Normal LV function. -- s/p overlapping DES x2 to mid-distal RCA. -- Continue DAPT w/ ASA/plavix for one year. We will treat residual disease in LAD medically. Low dose metoprolol and isosorbide added to regimen. Continue statin.  -- Radial site stable  HTN- continue metoprolol 12.5mg  BID and imdur  15mg  --BP soft with addition of isosorbide and metoprolol, so we will discontinue Maxide for now  HLD- continue statin  Hypokalemia- K 3.3. will supplement this AM . BMET at follow up appointment.   GERD- will change prilosec to protonix now that he is on Plavix.   The patient has had an uncomplicated hospital course and is recovering well. The radial catheter site is stable. He has been seen by Dr. Martinique today and deemed ready for discharge home. All follow-up appointments have been scheduled. Discharge medications are listed below.   Discharge Vitals: Blood pressure 96/63, pulse 66, temperature 98.6 F (37 C), temperature source Oral, resp. rate 20, height 5\' 5"  (1.651 m), weight 174 lb 9.7 oz (79.2 kg), SpO2 93 %.  Labs: Lab Results  Component Value Date   WBC 5.8 03/15/2014   HGB 11.9* 03/15/2014   HCT 35.5* 03/15/2014   MCV 84.5 03/15/2014   PLT 219 03/15/2014     Recent Labs Lab 03/15/14 0300  NA 135  K 3.3*  CL 104  CO2 24  BUN 18  CREATININE 1.09  CALCIUM 8.1*  GLUCOSE 97     Diagnostic Studies/Procedures   LHC 03/14/14 PCI Data: Vessel - RCA/Segment - mid and distal Percent Stenosis (pre) 99% and 80% TIMI-flow 1 Stent 2.5 x 20 mm Promus and 2.25 x 16 mm Promus respectively Percent Stenosis (post) 0% TIMI-flow (post) 3 Final Conclusions:  1. Severe 2 vessel obstructive CAD. The patient has chronic total occlusion of the distal LAD with collaterals. There is a severe lesion in the LAD after the second diagonal but this lesion  supplies only a small third diagonal branch. The mid RCA had a subtotal occlusion. 2. Normal LV function. Recommendations:  Continue DAPT for one year. We will treat residual disease in LAD medically. Will add low dose metoprolol and isosorbide.  Discharge Medications     Medication List    STOP taking these medications        PRILOSEC PO     triamterene-hydrochlorothiazide 37.5-25 MG per capsule  Commonly known as:   DYAZIDE      TAKE these medications        aspirin 81 MG tablet  Take 81 mg by mouth daily.     atorvastatin 20 MG tablet  Commonly known as:  LIPITOR  Take 1 tablet (20 mg total) by mouth daily at 6 PM.     CENTRUM SILVER ADULT 50+ PO  Take 1 tablet by mouth daily.     PRESERVISION AREDS PO  Take 1 tablet by mouth 2 (two) times daily.     clopidogrel 75 MG tablet  Commonly known as:  PLAVIX  Take 1 tablet (75 mg total) by mouth daily with breakfast.     isosorbide mononitrate 30 MG 24 hr tablet  Commonly known as:  IMDUR  Take 0.5 tablets (15 mg total) by mouth daily.     LATANOPROST OP  Apply 1 drop to eye at bedtime. In each eye     metoprolol tartrate 25 MG tablet  Commonly known as:  LOPRESSOR  Take 0.5 tablets (12.5 mg total) by mouth 2 (two) times daily.     NITROSTAT 0.4 MG SL tablet  Generic drug:  nitroGLYCERIN  Place 0.4 mg under the tongue every 5 (five) minutes as needed for chest pain.     omega-3 fish oil 1000 MG Caps capsule  Commonly known as:  MAXEPA  Take 1 capsule by mouth daily.     pantoprazole 40 MG tablet  Commonly known as:  PROTONIX  Take 1 tablet (40 mg total) by mouth daily.     sertraline 50 MG tablet  Commonly known as:  ZOLOFT  Take 50 mg by mouth daily.     tamsulosin 0.4 MG Caps capsule  Commonly known as:  FLOMAX  Take 0.8 mg by mouth daily after supper.     timolol 0.5 % ophthalmic solution  Commonly known as:  BETIMOL  Place 1 drop into the right eye daily.     VITAMIN D-3 PO  Take by mouth daily.        Disposition   The patient will be discharged in stable condition to home.  Follow-up Information    Follow up with Truitt Merle, NP On 03/29/2014.   Specialty:  Nurse Practitioner   Why:  @ 10am   Contact information:   [address] [place] [address] [phone]         Duration of Discharge Encounter: Greater than 30 minutes including physician and PA time.  Mable Fill R PA-C 03/15/2014, 8:36 AM

## 2014-03-15 NOTE — Progress Notes (Signed)
Patient Name: [name] Date of Encounter: 03/15/2014     Active Problems:   Aortic valve disease   Abnormal nuclear stress test   Angina pectoris    SUBJECTIVE  No further CP. Feeling well. Ready to go home  CURRENT MEDS . aspirin  81 mg Oral Daily  . atorvastatin  20 mg Oral q1800  . clopidogrel  75 mg Oral Q breakfast  . isosorbide mononitrate  15 mg Oral Daily  . latanoprost  1 drop Both Eyes QHS  . metoprolol tartrate  12.5 mg Oral BID  . pantoprazole  40 mg Oral Daily  . sertraline  50 mg Oral Daily  . tamsulosin  0.8 mg Oral QPC supper  . timolol  1 drop Right Eye Daily  . triamterene-hydrochlorothiazide  1 tablet Oral BH-[id number]    OBJECTIVE  Filed Vitals:   03/14/14 1700 03/14/14 2100 03/15/14 0011 03/15/14 0621  BP: 106/64 99/47 109/47 109/49  Pulse: 54 58 61 62  Temp:  98.7 F (37.1 C) 98.8 F (37.1 C) 98.3 F (36.8 C)  TempSrc:  Oral Oral Oral  Resp:  18 18 20   Height:      Weight:   174 lb 9.7 oz (79.2 kg)   SpO2: 95% 96% 94% 94%    Intake/Output Summary (Last 24 hours) at 03/15/14 0639 Last data filed at 03/14/14 2100  Gross per 24 hour  Intake 881.23 ml  Output    500 ml  Net 381.23 ml   Filed Weights   03/14/14 0936 03/15/14 0011  Weight: 174 lb (78.926 kg) 174 lb 9.7 oz (79.2 kg)    PHYSICAL EXAM  General: Pleasant, NAD. Neuro: Alert and oriented X 3. Moves all extremities spontaneously. Psych: Normal affect. HEENT:  Normal  Neck: Supple without bruits or JVD. Lungs:  Resp regular and unlabored, CTA. Heart: RRR no s3, s4, or + 3/6 SEM at RUSB Abdomen: Soft, non-tender, non-distended, BS + x 4.  Extremities: No clubbing, cyanosis or edema. DP/PT/Radials 2+ and equal bilaterally.  Accessory Clinical Findings  CBC  Recent Labs  03/15/14 0300  WBC 5.8  HGB 11.9*  HCT 35.5*  MCV 84.5  PLT 993   Basic Metabolic Panel  Recent Labs  03/15/14 0300  NA 135  K 3.3*  CL 104  CO2 24  GLUCOSE 97  BUN 18  CREATININE  1.09  CALCIUM 8.1*    TELE  NSR with some sinus brady  Radiology/Studies  LHC 03/14/14 PCI Data: Vessel - RCA/Segment - mid and distal Percent Stenosis (pre) 99% and 80% TIMI-flow 1 Stent 2.5 x 20 mm Promus and 2.25 x 16 mm Promus respectively Percent Stenosis (post) 0% TIMI-flow (post) 3 Final Conclusions:  1. Severe 2 vessel obstructive CAD. The patient has chronic total occlusion of the distal LAD with collaterals. There is a severe lesion in the LAD after the second diagonal but this lesion supplies only a small third diagonal branch. The mid RCA had a subtotal occlusion. 2. Normal LV function. Recommendations:  Continue DAPT for one year. We will treat residual disease in LAD medically. Will add low dose metoprolol and isosorbide. Anticipate DC in am.    ASSESSMENT AND PLAN  [name] is a 81 y.o. 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   CAD/USA- seen by Dr. Mare Ferrari on 02/16/14 for evaluation of new chest pain.  -- s/p out myoview stress tset on 03/10/14 which showed significant reversible inferoapical ischemia that was not present on a previous stress test a year ago. -- s/p LHC yesterday which revealed   1. Severe 2 vessel obstructive CAD. The patient has CTO of the dLAD with collaterals. There is a severe lesion in the LAD after the second diagonal but this lesion supplies only a small third diagonal branch. The mid RCA had a subtotal occlusion.  2. Normal LV function. -- s/p overlapping DES x2 to mid-distal RCA. -- Continue DAPT w/ ASA/plavix for one year. We will treat residual disease in LAD medically. Low  dose metoprolol and isosorbide added to regimen. Continue statin.   -- Radial site stabel  HTN- continue metoprolol 12.5mg  BID, Maxide 25  HLD- continue statin  Hypokalemia- K 3.3. will supplement this AM   Signed, Eileen Stanford PA-C  Pager [phone]  Patient seen and examined and history reviewed. Agree with above findings and plan. Feels very well this am. Potassium repleted. VSS. Ecg is stable. No radial site hematoma. Will plan on DC today. Follow up with Dr. Mare Ferrari. BP is OK for now. If low BP is an issue with addition of isosorbide and metoprolol I would favor stopping Maxide.  [name], [place] 03/15/2014 7:21 AM

## 2014-03-16 ENCOUNTER — Telehealth: Payer: Self-pay | Admitting: Cardiology

## 2014-03-16 NOTE — Telephone Encounter (Signed)
New Message      Patients wife wants to know if he can have a drink b/ c he had 2 stents put in and she would like to know what he can and cannot do.Marland Kitchen

## 2014-03-16 NOTE — Telephone Encounter (Signed)
Discussed with  Dr. Mare Ferrari and ok to have a glass of wine, advised wife

## 2014-03-22 DIAGNOSIS — L219 Seborrheic dermatitis, unspecified: Secondary | ICD-10-CM | POA: Diagnosis not present

## 2014-03-22 DIAGNOSIS — L821 Other seborrheic keratosis: Secondary | ICD-10-CM | POA: Diagnosis not present

## 2014-03-22 DIAGNOSIS — D485 Neoplasm of uncertain behavior of skin: Secondary | ICD-10-CM | POA: Diagnosis not present

## 2014-03-22 DIAGNOSIS — Z85828 Personal history of other malignant neoplasm of skin: Secondary | ICD-10-CM | POA: Diagnosis not present

## 2014-03-22 DIAGNOSIS — H16139 Photokeratitis, unspecified eye: Secondary | ICD-10-CM | POA: Diagnosis not present

## 2014-03-29 ENCOUNTER — Ambulatory Visit (INDEPENDENT_AMBULATORY_CARE_PROVIDER_SITE_OTHER): Payer: Medicare Other | Admitting: Nurse Practitioner

## 2014-03-29 ENCOUNTER — Encounter: Payer: Self-pay | Admitting: Nurse Practitioner

## 2014-03-29 VITALS — BP 124/72 | HR 73 | Ht 65.0 in | Wt 176.8 lb

## 2014-03-29 DIAGNOSIS — E7800 Pure hypercholesterolemia, unspecified: Secondary | ICD-10-CM

## 2014-03-29 DIAGNOSIS — I259 Chronic ischemic heart disease, unspecified: Secondary | ICD-10-CM

## 2014-03-29 DIAGNOSIS — Z955 Presence of coronary angioplasty implant and graft: Secondary | ICD-10-CM

## 2014-03-29 LAB — CBC
HCT: 37 % — ABNORMAL LOW (ref 39.0–52.0)
Hemoglobin: 12.6 g/dL — ABNORMAL LOW (ref 13.0–17.0)
MCHC: 33.9 g/dL (ref 30.0–36.0)
MCV: 86.1 fl (ref 78.0–100.0)
Platelets: 165 10*3/uL (ref 150.0–400.0)
RBC: 4.3 Mil/uL (ref 4.22–5.81)
RDW: 16.6 % — ABNORMAL HIGH (ref 11.5–15.5)
WBC: 6.8 10*3/uL (ref 4.0–10.5)

## 2014-03-29 LAB — BASIC METABOLIC PANEL
BUN: 16 mg/dL (ref 6–23)
CO2: 25 mEq/L (ref 19–32)
Calcium: 8.7 mg/dL (ref 8.4–10.5)
Chloride: 107 mEq/L (ref 96–112)
Creatinine, Ser: 0.93 mg/dL (ref 0.40–1.50)
GFR: 81.47 mL/min (ref >60.00)
Glucose, Bld: 126 mg/dL — ABNORMAL HIGH (ref 70–99)
Potassium: 3.8 mEq/L (ref 3.5–5.1)
Sodium: 140 mEq/L (ref 135–145)

## 2014-03-29 LAB — HEPATIC FUNCTION PANEL
ALT: 17 U/L (ref 0–53)
AST: 23 U/L (ref 0–37)
Albumin: 3.4 g/dL — ABNORMAL LOW (ref 3.5–5.2)
Alkaline Phosphatase: 73 U/L (ref 39–117)
Bilirubin, Direct: 0.1 mg/dL (ref 0.0–0.3)
Total Bilirubin: 0.5 mg/dL (ref 0.2–1.2)
Total Protein: 6.3 g/dL (ref 6.0–8.3)

## 2014-03-29 NOTE — Progress Notes (Signed)
CARDIOLOGY OFFICE NOTE  Date:  03/29/2014    [name] Date of Birth: 09-04-1923 Medical Record #[id number]  PCP:  [name], MD  Cardiologist:  Mare Ferrari    Chief Complaint  Patient presents with  . Coronary Artery Disease    Post PCI visit - seen for Dr. Mare Ferrari.      History of Present Illness: [name] is a 81 y.o. male who presents today for a post hospital visit. Seen for Dr. Mare Ferrari. 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   Most recently presented to [facility] on 03/14/14 for planned coronary angiography after an abnormal outpatient nuclear stress test. Had PCI with DES x 2 to the mid RCA due to subtotal occlusion. Has normal EF. On DAPT for one year. Noted to have residual disease in the LAD which will be managed medically. Low dose beta blocker and Imdur added at discharge.   Comes in today. Here with his wife. Doing well. He is hard of hearing. No chest pain. Breathing is ok. Still gets some DOE but it is improving. He is already walking 15 minutes twice a day. Wants to go to cardiac rehab. Tolerating his medicines. May need to have some Moh's surgery - will need to have dermatology talk with [place]. He did go to the dentist yesterday and they post treated him with antibiotic for his cleaning. No indication for SBE.   Past Medical History  Diagnosis Date  . 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 Compression fracture of lumbar spine, non-traumatic   . Anemia   . Malnutrition   . Hypertension   . GERD (gastroesophageal reflux disease)   . Elevated PSA   . Glaucoma   . Acute appendicitis   . Osteoporosis   . Dyslipidemia   . History of blood transfusion     "several; related to hairy cell  leukemia & tx "  . History of stomach ulcers 1968  . DJD (degenerative joint disease) of lumbar spine   . Osteoarthritis   . Arthritis     "back" (03/14/2014)  . Anxiety   . Hairy cell leukemia dx'd 1980  . Colon cancer 1984  . Skin cancer of face   . CAD (coronary artery disease)     a. 03/14/14  s/p overlapping DES x2 to mid-distal RCA.    Past Surgical History  Procedure Laterality Date  . Splenectomy  1992  . Mole removal Left 1985    cheek  . Orif ankle fracture Left 2000  . Shoulder surgery  04/2004  . Cataract extraction, bilateral Bilateral 02/2008  . Mohs surgery Left 02/2011  . Lipoma excision Right 07/2011    liposarcoma resection; "back"  . Coronary angioplasty with stent placement  03/14/2014    "2"  . Appendectomy  10/2007  . Tonsillectomy and adenoidectomy  1939  . Fracture surgery    . Colon surgery  1984    "sigmoid; open"  . Mohs surgery  X 3    "all on my face"  . Left heart catheterization with coronary angiogram N/A 03/14/2014    Procedure: LEFT HEART CATHETERIZATION WITH CORONARY ANGIOGRAM;  Surgeon: Peter M Martinique, MD;  Location[facility] CATH LAB;  Service: Cardiovascular;  Laterality: N/A;     Medications: Current Outpatient  Prescriptions  Medication Sig Dispense Refill  . aspirin 81 MG tablet Take 81 mg by mouth daily.    Marland Kitchen atorvastatin (LIPITOR) 20 MG tablet Take 1 tablet (20 mg total) by mouth daily at 6 PM. 30 tablet 1  . Cholecalciferol (VITAMIN D-3 PO) Take by mouth daily.    . clopidogrel (PLAVIX) 75 MG tablet Take 1 tablet (75 mg total) by mouth daily with breakfast. 30 tablet 1  . isosorbide mononitrate (IMDUR) 30 MG 24 hr tablet Take 0.5 tablets (15 mg total) by mouth daily. 30 tablet 1  . LATANOPROST OP Apply 1 drop to eye at bedtime. In each eye    . metoprolol tartrate (LOPRESSOR) 25 MG tablet Take 0.5 tablets (12.5 mg total) by mouth 2 (two) times daily. 30 tablet 1  . Multiple Vitamins-Minerals (CENTRUM SILVER ADULT 50+ PO) Take 1 tablet by  mouth daily.     . Multiple Vitamins-Minerals (PRESERVISION AREDS PO) Take 1 tablet by mouth 2 (two) times daily.     Marland Kitchen NITROSTAT 0.4 MG SL tablet Place 0.4 mg under the tongue every 5 (five) minutes as needed for chest pain.   12  . omega-3 fish oil (MAXEPA) 1000 MG CAPS capsule Take 1 capsule by mouth daily.     . pantoprazole (PROTONIX) 40 MG tablet Take 1 tablet (40 mg total) by mouth daily. 30 tablet 1  . sertraline (ZOLOFT) 50 MG tablet Take 50 mg by mouth daily.    . tamsulosin (FLOMAX) 0.4 MG CAPS capsule Take 0.8 mg by mouth daily after supper.     . timolol (BETIMOL) 0.5 % ophthalmic solution Place 1 drop into the right eye daily.      No current facility-administered medications for this visit.    Allergies: Allergies  Allergen Reactions  . Antazoline Anaphylaxis  . Antihistamines, Chlorpheniramine-Type     Nervous, jittery  . Sulfa Antibiotics Rash    Social History: The patient  reports that he has quit smoking. His smoking use included Cigarettes and Cigars. He has a .25 pack-year smoking history. He has never used smokeless tobacco. He reports that he drinks about 5.4 oz of alcohol per week. He reports that he does not use illicit drugs.   Family History: The patient's family history includes Congestive Heart Failure (age of onset: 22) in his father; Diabetes Mellitus II in his brother; Heart Problems in his brother; Prostate cancer in his brother.   Review of Systems: Please see the history of present illness.   Otherwise, the review of systems is positive for DOE and difficulty urinating.   All other systems are reviewed and negative.   Physical Exam: VS:  BP 124/72 mmHg  Pulse 73  Ht 5\' 5"  (1.651 m)  Wt 176 lb 12.8 oz (80.196 kg)  BMI 29.42 kg/m2 .  BMI Body mass index is 29.42 kg/(m^2). General: Pleasant. Well developed, well nourished and in no acute distress. Looks younger than stated age.  HEENT: Normal. Neck: Supple, no JVD, carotid bruits, or masses  noted.  Cardiac: Regular rate and rhythm. No murmurs, rubs, or gallops. No edema.  Respiratory:  Lungs are clear to auscultation bilaterally with normal work of breathing.  GI: Soft and nontender.  MS: No deformity or atrophy. Gait and ROM intact. Skin: Warm and dry. Color is normal.  Neuro:  Strength and sensation are intact and no gross focal deficits noted.  Psych: Alert, appropriate and with normal affect.   Wt Readings from Last 3 Encounters:  03/29/14  176 lb 12.8 oz (80.196 kg)  03/15/14 174 lb 9.7 oz (79.2 kg)  03/10/14 172 lb (78.019 kg)    LABORATORY DATA:  EKG:  EKG is not ordered today.   Lab Results  Component Value Date   WBC 5.8 03/15/2014   HGB 11.9* 03/15/2014   HCT 35.5* 03/15/2014   PLT 219 03/15/2014   GLUCOSE 97 03/15/2014   ALT 43 11/02/2007   AST 51* 11/02/2007   NA 135 03/15/2014   K 3.3* 03/15/2014   CL 104 03/15/2014   CREATININE 1.09 03/15/2014   BUN 18 03/15/2014   CO2 24 03/15/2014   INR 1.0 03/10/2014    BNP (last 3 results) No results for input(s): PROBNP in the last 8760 hours.  Other Studies Reviewed Today:    Cardiac Catheterization Procedure Note  Name: MIKLO AKEN MRN: [id number] DOB: [birth year]  Procedure: Left Heart Cath, Selective Coronary Angiography, LV angiography, PTCA and stenting of the mid and distal RCA  Indication: 81 yo WM presents with progressive angina. Myoview study demonstrates inferior and apical ischemia.   Procedural Details: The right wrist was prepped, draped, and anesthetized with 1% lidocaine. Using the modified Seldinger technique, a 6 French slender sheath was introduced into the right radial artery. 3 mg of verapamil was administered through the sheath, weight-based unfractionated heparin was administered intravenously. Standard Judkins catheters were used for selective coronary angiography and left ventriculography. Catheter exchanges were performed over an exchange length  guidewire.  PROCEDURAL FINDINGS Hemodynamics: AO 129/62 mean 90 mm Hg LV 138/19 mm Hg  Coronary angiography: Coronary dominance: right  Left mainstem: Moderately calcified without significant disease.   Left anterior descending (LAD): The LAD has moderate calcification in the proximal and mid vessel. Immediately after the second diagonal there is a focal 95% stenosis. Following the third diagonal the LAD is occluded. It fills by left to left collaterals.   Ramus intermediate: this is a very large branch with 30% stenosis in the proximal vessel.   Left circumflex (LCx): this is a relatively small branch without significant disease.   Right coronary artery (RCA): The RCA is small and diffusely diseased. The mid vessel is calcified with a 99% stenosis and TIMI 1 flow. The distal RCA branches fill by left to right collaterals.   Left ventriculography: Left ventricular systolic function is normal, LVEF is estimated at 65%, there is no significant mitral regurgitation   PCI Note: Following the diagnostic procedure, the decision was made to proceed with PCI of the RCA. Weight-based bivalirudin was given for anticoagulation. Plavix 600 mg was given orally. Once a therapeutic ACT was achieved, a 6 [place] FR4 guide catheter was inserted. A prowater coronary guidewire was used to cross the lesion. The lesion was predilated with a 2.0 mm balloon. After better perfusion was improved there was a lesion noted in the distal RCA as well. This lesion was then stented with a 2.25 x 16 mm Promus stent. The stent was postdilated with a 2.5 mm noncompliant balloon. The mid vessel lesion was then stented with a 2.5 x 20 mm Promus stent. This was post dilated with a 2.5 mm noncompliant balloon. Following PCI, there was 0% residual stenosis and TIMI-3 flow. Final angiography confirmed an excellent result. The patient tolerated the procedure well. There were no immediate procedural complications.  A TR band was used for radial hemostasis. The patient was transferred to the post catheterization recovery area for further monitoring.  PCI Data: Vessel - RCA/Segment - mid and distal  Percent Stenosis (pre) 99% and 80% TIMI-flow 1 Stent 2.5 x 20 mm Promus and 2.25 x 16 mm Promus respectively Percent Stenosis (post) 0% TIMI-flow (post) 3  Final Conclusions:  1. Severe 2 vessel obstructive CAD. The patient has chronic total occlusion of the distal LAD with collaterals. There is a severe lesion in the LAD after the second diagonal but this lesion supplies only a small third diagonal branch. The mid RCA had a subtotal occlusion. 2. Normal LV function.  Recommendations:  Continue DAPT for one year. We will treat residual disease in LAD medically. Will add low dose metoprolol and isosorbide. Anticipate DC in am.   Peter Martinique, [place] 03/14/2014, 12:34 PM  Echo Study Conclusions from 01/2013  - Left ventricle: The cavity size was normal. There was mild concentric hypertrophy. Systolic function was normal. The estimated ejection fraction was in the range of 60% to 65%. Wall motion was normal; there were no regional wall motion abnormalities. Doppler parameters are consistent with abnormal left ventricular relaxation (grade 1 diastolic dysfunction). Doppler parameters are consistent with high ventricular filling pressure. - Aortic valve: Normal-sized, calcified annulus. Trileaflet; severely thickened leaflets. Cusp separation was reduced. There was mild stenosis. - Atrial septum: No defect or patent foramen ovale was identified.    Assessment/Plan: 1. CAD - recent chest pain/abnormal myoview - now s/p cath with PCI to the mid RCA. Normal LV function. Residual LAD disease to be treated medically. He is doing very well clinically. Recheck baseline labs today. Refer to cardiac rehab.  2. HTN - BP is great on current regimen  3. HLD - on statin - recheck lipids  on return.  4. Advanced age  Current medicines are reviewed with the patient today.  The patient does not have concerns regarding medicines.   I showed both of them how to set up their medical alerts on their Iphones.   The following changes have been made:  no change  Labs/ tests ordered today include:   Orders Placed This Encounter  Procedures  . Basic metabolic panel  . CBC  . Hepatic function panel    Disposition:   FU with Dr. Mare Ferrari in 3 months  Patient is agreeable to this plan and will call if any problems develop in the interim.   Signed: Burtis Junes, RN, ANP-C 03/29/2014 10:50 AM  [facility] [address] [place], [place]  [address] Phone: [phone] Fax: [phone]

## 2014-03-29 NOTE — Patient Instructions (Addendum)
We will be checking the following labs today BMET and CBC  Stay on your current medicines  See Dr. Mare Ferrari in 3 months with fasting labs  I have sent a note to cardiac rehab - they will be calling you  Call the [place] office at [phone] if you have any questions, problems or concerns.

## 2014-04-03 ENCOUNTER — Telehealth: Payer: Self-pay | Admitting: Nurse Practitioner

## 2014-04-03 NOTE — Telephone Encounter (Signed)
New message ° ° ° ° °Want lab results °

## 2014-04-03 NOTE — Telephone Encounter (Signed)
Discussed lab results done 03/29/14 with patient.

## 2014-04-03 NOTE — Telephone Encounter (Signed)
Unable to set up mychart. Gave them the Mychart contact info//sr

## 2014-04-06 ENCOUNTER — Telehealth: Payer: Self-pay | Admitting: Cardiology

## 2014-04-06 NOTE — Telephone Encounter (Signed)
New Message  Pt called states that [name] was lining him up for cardiac rehab// He says he has not heard anything else about and would like a call back to get things started. Please call

## 2014-04-07 NOTE — Telephone Encounter (Signed)
Spoke with Cardiac Rehab and they have information and are working on scheduling

## 2014-04-10 DIAGNOSIS — H903 Sensorineural hearing loss, bilateral: Secondary | ICD-10-CM | POA: Diagnosis not present

## 2014-04-11 DIAGNOSIS — H43812 Vitreous degeneration, left eye: Secondary | ICD-10-CM | POA: Diagnosis not present

## 2014-04-11 DIAGNOSIS — H3532 Exudative age-related macular degeneration: Secondary | ICD-10-CM | POA: Diagnosis not present

## 2014-04-11 DIAGNOSIS — H35412 Lattice degeneration of retina, left eye: Secondary | ICD-10-CM | POA: Diagnosis not present

## 2014-04-11 DIAGNOSIS — H35372 Puckering of macula, left eye: Secondary | ICD-10-CM | POA: Diagnosis not present

## 2014-04-13 ENCOUNTER — Telehealth: Payer: Self-pay | Admitting: Cardiology

## 2014-04-13 ENCOUNTER — Telehealth (HOSPITAL_COMMUNITY): Payer: Self-pay | Admitting: *Deleted

## 2014-04-13 MED ORDER — ISOSORBIDE MONONITRATE ER 30 MG PO TB24: 15.0000 mg | ORAL_TABLET | Freq: Every day | ORAL | Status: DC

## 2014-04-13 MED ORDER — PANTOPRAZOLE SODIUM 40 MG PO TBEC: 40.0000 mg | DELAYED_RELEASE_TABLET | Freq: Every day | ORAL | Status: DC

## 2014-04-13 MED ORDER — ATORVASTATIN CALCIUM 20 MG PO TABS: 20.0000 mg | ORAL_TABLET | Freq: Every day | ORAL | Status: DC

## 2014-04-13 MED ORDER — CLOPIDOGREL BISULFATE 75 MG PO TABS: 75.0000 mg | ORAL_TABLET | Freq: Every day | ORAL | Status: DC

## 2014-04-13 MED ORDER — METOPROLOL TARTRATE 25 MG PO TABS: 12.5000 mg | ORAL_TABLET | Freq: Two times a day (BID) | ORAL | Status: DC

## 2014-04-13 NOTE — Telephone Encounter (Signed)
Spoke with Cardiac Rehab and Claudette should be calling patient this afternoon Advised patient

## 2014-04-13 NOTE — Telephone Encounter (Signed)
New Msg       Pt calling, states he did not receive a call about rehab/therapy and would like to discuss this.    Please return pt call.

## 2014-04-13 NOTE — Telephone Encounter (Signed)
Received signed MD order.  Message left to please contact for participating in cardiac rehab. Cherre Huger, BSN

## 2014-04-20 ENCOUNTER — Encounter (HOSPITAL_COMMUNITY)
Admission: RE | Admit: 2014-04-20 | Discharge: 2014-04-20 | Disposition: A | Discharge status: Home / Self Care | Payer: Medicare Other | Source: Ambulatory Visit | Attending: Cardiology | Admitting: Cardiology

## 2014-04-20 DIAGNOSIS — Z955 Presence of coronary angioplasty implant and graft: Secondary | ICD-10-CM | POA: Insufficient documentation

## 2014-04-20 DIAGNOSIS — I2510 Atherosclerotic heart disease of native coronary artery without angina pectoris: Secondary | ICD-10-CM | POA: Insufficient documentation

## 2014-04-20 DIAGNOSIS — Z48812 Encounter for surgical aftercare following surgery on the circulatory system: Secondary | ICD-10-CM | POA: Insufficient documentation

## 2014-04-20 NOTE — Progress Notes (Signed)
Cardiac Rehab Medication Review by a Pharmacist  Does the patient  feel that his/her medications are working for him/her?  yes  Has the patient been experiencing any side effects to the medications prescribed?  no  Does the patient measure his/her own blood pressure or blood glucose at home?  yes   Does the patient have any problems obtaining medications due to transportation or finances?   no  Understanding of regimen: good Understanding of indications: good Potential of compliance: good   Pharmacist comments: Patient does not have any barriers to obtaining his medications. He measures his blood pressure at home from time to time.  He is not experiencing any side effects with his medications so far.   Ayesha Markwell L. Nicole Kindred, PharmD Clinical Pharmacy Resident Pager: [phone] 04/20/2014 9:06 AM

## 2014-04-26 ENCOUNTER — Encounter (HOSPITAL_COMMUNITY): Payer: Medicare Other

## 2014-04-28 ENCOUNTER — Encounter (HOSPITAL_COMMUNITY): Payer: Medicare Other

## 2014-05-01 ENCOUNTER — Encounter (HOSPITAL_COMMUNITY)
Admission: RE | Admit: 2014-05-01 | Discharge: 2014-05-01 | Disposition: A | Discharge status: Home / Self Care | Payer: Medicare Other | Source: Ambulatory Visit | Attending: Cardiology | Admitting: Cardiology

## 2014-05-01 DIAGNOSIS — Z955 Presence of coronary angioplasty implant and graft: Secondary | ICD-10-CM | POA: Diagnosis not present

## 2014-05-01 DIAGNOSIS — I2510 Atherosclerotic heart disease of native coronary artery without angina pectoris: Secondary | ICD-10-CM | POA: Diagnosis not present

## 2014-05-01 DIAGNOSIS — Z48812 Encounter for surgical aftercare following surgery on the circulatory system: Secondary | ICD-10-CM | POA: Diagnosis not present

## 2014-05-01 NOTE — Progress Notes (Signed)
Pt started cardiac rehab today.  Pt tolerated light exercise without difficulty. Telemetry rhythm Sinus with a bundle branch block this has been previously documented. Pre exercise blood pressure 104/60. Vital signs stable with exercise. Post exercise blood pressure 92/66. Patient asymptomatic.  Given H20. Recheck blood pressure 90/62 then 108/60. Will fax exercise flow sheets to Dr. Sherryl Barters office for review. PHQ =0. Will continue to monitor the patient throughout  the program. [name]'s short and long term goals are to know how much to do, get back to his trainer and loose weight.

## 2014-05-03 ENCOUNTER — Encounter (HOSPITAL_COMMUNITY)
Admission: RE | Admit: 2014-05-03 | Discharge: 2014-05-03 | Disposition: A | Discharge status: Home / Self Care | Payer: Medicare Other | Source: Ambulatory Visit | Attending: Cardiology | Admitting: Cardiology

## 2014-05-03 ENCOUNTER — Telehealth: Payer: Self-pay | Admitting: Cardiology

## 2014-05-03 DIAGNOSIS — I2510 Atherosclerotic heart disease of native coronary artery without angina pectoris: Secondary | ICD-10-CM | POA: Diagnosis not present

## 2014-05-03 DIAGNOSIS — Z48812 Encounter for surgical aftercare following surgery on the circulatory system: Secondary | ICD-10-CM | POA: Diagnosis not present

## 2014-05-03 DIAGNOSIS — Z955 Presence of coronary angioplasty implant and graft: Secondary | ICD-10-CM | POA: Insufficient documentation

## 2014-05-03 NOTE — Telephone Encounter (Signed)
Follow up      Pt is going home from cardiac rehab----[name] is sending bp readings over to Dr Mare Ferrari.  Call pt at home with any instructions

## 2014-05-03 NOTE — Telephone Encounter (Signed)
New Message  Verdis Frederickson from Cardiac Rehab requested speak w/ Rip Harbour; per Verdis Frederickson- pt BP was low again and is at cardiac rehab presently. Please call back and discuss.

## 2014-05-03 NOTE — Telephone Encounter (Signed)
Low blood pressure again today, patient asymptomatic [name] from cardiac rehab will fax over blood pressures for  Dr. Mare Ferrari to review

## 2014-05-03 NOTE — Progress Notes (Addendum)
Post exercise blood pressure 91/60 via automatic cuff. Patient asymptomatic, given Gatorade. Recheck blood pressure 94/55 sitting, 93/65 standing.  Recheck blood pressure 107/60. Rip Harbour, Dr Sherryl Barters nurse called and notified. Will fax exercise flow sheets to Dr. Sherryl Barters office for review.

## 2014-05-04 NOTE — Telephone Encounter (Signed)
Advised patient, verbalized understanding  

## 2014-05-04 NOTE — Telephone Encounter (Signed)
Decrease metoprolol tartrate to just 12.5 mg once a day

## 2014-05-04 NOTE — Telephone Encounter (Signed)
Received information from cardiac rehab, will forward to  Dr. Mare Ferrari for review

## 2014-05-04 NOTE — Telephone Encounter (Signed)
Left message to call back  

## 2014-05-05 ENCOUNTER — Encounter (HOSPITAL_COMMUNITY)
Admission: RE | Admit: 2014-05-05 | Discharge: 2014-05-05 | Disposition: A | Discharge status: Home / Self Care | Payer: Medicare Other | Source: Ambulatory Visit | Attending: Cardiology | Admitting: Cardiology

## 2014-05-05 DIAGNOSIS — Z48812 Encounter for surgical aftercare following surgery on the circulatory system: Secondary | ICD-10-CM | POA: Diagnosis not present

## 2014-05-05 DIAGNOSIS — Z955 Presence of coronary angioplasty implant and graft: Secondary | ICD-10-CM | POA: Diagnosis not present

## 2014-05-05 DIAGNOSIS — I2510 Atherosclerotic heart disease of native coronary artery without angina pectoris: Secondary | ICD-10-CM | POA: Diagnosis not present

## 2014-05-05 NOTE — Progress Notes (Signed)
Mr Halls has decreased his metoprolol to 12.5mg  once a day per Dr Mare Ferrari. Will continue to monitor the patient throughout  the program.

## 2014-05-08 ENCOUNTER — Encounter (HOSPITAL_COMMUNITY)
Admission: RE | Admit: 2014-05-08 | Discharge: 2014-05-08 | Disposition: A | Discharge status: Home / Self Care | Payer: Medicare Other | Source: Ambulatory Visit | Attending: Cardiology | Admitting: Cardiology

## 2014-05-08 DIAGNOSIS — Z955 Presence of coronary angioplasty implant and graft: Secondary | ICD-10-CM | POA: Diagnosis not present

## 2014-05-08 DIAGNOSIS — I2510 Atherosclerotic heart disease of native coronary artery without angina pectoris: Secondary | ICD-10-CM | POA: Diagnosis not present

## 2014-05-08 DIAGNOSIS — Z48812 Encounter for surgical aftercare following surgery on the circulatory system: Secondary | ICD-10-CM | POA: Diagnosis not present

## 2014-05-08 NOTE — Progress Notes (Signed)
Reviewed home exercise with pt today.  Pt plans to continue walking at home and [place].  Reviewed THR, pulse, RPE, sign and symptoms, NTG use, and when to call 911 or MD.  Pt voiced understanding. Alberteen Sam, MA, ACSM RCEP

## 2014-05-09 DIAGNOSIS — H43812 Vitreous degeneration, left eye: Secondary | ICD-10-CM | POA: Diagnosis not present

## 2014-05-09 DIAGNOSIS — H3532 Exudative age-related macular degeneration: Secondary | ICD-10-CM | POA: Diagnosis not present

## 2014-05-09 DIAGNOSIS — H3341 Traction detachment of retina, right eye: Secondary | ICD-10-CM | POA: Diagnosis not present

## 2014-05-09 DIAGNOSIS — H35412 Lattice degeneration of retina, left eye: Secondary | ICD-10-CM | POA: Diagnosis not present

## 2014-05-10 ENCOUNTER — Encounter (HOSPITAL_COMMUNITY)
Admission: RE | Admit: 2014-05-10 | Discharge: 2014-05-10 | Disposition: A | Discharge status: Home / Self Care | Payer: Medicare Other | Source: Ambulatory Visit | Attending: Cardiology | Admitting: Cardiology

## 2014-05-10 DIAGNOSIS — Z48812 Encounter for surgical aftercare following surgery on the circulatory system: Secondary | ICD-10-CM | POA: Diagnosis not present

## 2014-05-10 DIAGNOSIS — I2510 Atherosclerotic heart disease of native coronary artery without angina pectoris: Secondary | ICD-10-CM | POA: Diagnosis not present

## 2014-05-10 DIAGNOSIS — Z955 Presence of coronary angioplasty implant and graft: Secondary | ICD-10-CM | POA: Diagnosis not present

## 2014-05-12 ENCOUNTER — Encounter (HOSPITAL_COMMUNITY)
Admission: RE | Admit: 2014-05-12 | Discharge: 2014-05-12 | Disposition: A | Discharge status: Home / Self Care | Payer: Medicare Other | Source: Ambulatory Visit | Attending: Cardiology | Admitting: Cardiology

## 2014-05-12 DIAGNOSIS — Z955 Presence of coronary angioplasty implant and graft: Secondary | ICD-10-CM | POA: Diagnosis not present

## 2014-05-12 DIAGNOSIS — I2510 Atherosclerotic heart disease of native coronary artery without angina pectoris: Secondary | ICD-10-CM | POA: Diagnosis not present

## 2014-05-12 DIAGNOSIS — Z48812 Encounter for surgical aftercare following surgery on the circulatory system: Secondary | ICD-10-CM | POA: Diagnosis not present

## 2014-05-15 ENCOUNTER — Encounter (HOSPITAL_COMMUNITY)
Admission: RE | Admit: 2014-05-15 | Discharge: 2014-05-15 | Disposition: A | Discharge status: Home / Self Care | Payer: Medicare Other | Source: Ambulatory Visit | Attending: Cardiology | Admitting: Cardiology

## 2014-05-15 DIAGNOSIS — I2510 Atherosclerotic heart disease of native coronary artery without angina pectoris: Secondary | ICD-10-CM | POA: Diagnosis not present

## 2014-05-15 DIAGNOSIS — Z955 Presence of coronary angioplasty implant and graft: Secondary | ICD-10-CM | POA: Diagnosis not present

## 2014-05-15 DIAGNOSIS — Z48812 Encounter for surgical aftercare following surgery on the circulatory system: Secondary | ICD-10-CM | POA: Diagnosis not present

## 2014-05-17 ENCOUNTER — Encounter (HOSPITAL_COMMUNITY)
Admission: RE | Admit: 2014-05-17 | Discharge: 2014-05-17 | Disposition: A | Discharge status: Home / Self Care | Payer: Medicare Other | Source: Ambulatory Visit | Attending: Cardiology | Admitting: Cardiology

## 2014-05-17 DIAGNOSIS — Z955 Presence of coronary angioplasty implant and graft: Secondary | ICD-10-CM | POA: Diagnosis not present

## 2014-05-17 DIAGNOSIS — I2510 Atherosclerotic heart disease of native coronary artery without angina pectoris: Secondary | ICD-10-CM | POA: Diagnosis not present

## 2014-05-17 DIAGNOSIS — Z48812 Encounter for surgical aftercare following surgery on the circulatory system: Secondary | ICD-10-CM | POA: Diagnosis not present

## 2014-05-18 DIAGNOSIS — R911 Solitary pulmonary nodule: Secondary | ICD-10-CM | POA: Diagnosis not present

## 2014-05-18 DIAGNOSIS — J9811 Atelectasis: Secondary | ICD-10-CM | POA: Diagnosis not present

## 2014-05-18 DIAGNOSIS — Z79899 Other long term (current) drug therapy: Secondary | ICD-10-CM | POA: Diagnosis not present

## 2014-05-18 DIAGNOSIS — L299 Pruritus, unspecified: Secondary | ICD-10-CM | POA: Diagnosis not present

## 2014-05-18 DIAGNOSIS — C499 Malignant neoplasm of connective and soft tissue, unspecified: Secondary | ICD-10-CM | POA: Diagnosis not present

## 2014-05-18 DIAGNOSIS — R3911 Hesitancy of micturition: Secondary | ICD-10-CM | POA: Diagnosis not present

## 2014-05-18 DIAGNOSIS — H539 Unspecified visual disturbance: Secondary | ICD-10-CM | POA: Diagnosis not present

## 2014-05-18 DIAGNOSIS — C761 Malignant neoplasm of thorax: Secondary | ICD-10-CM | POA: Diagnosis not present

## 2014-05-19 ENCOUNTER — Encounter (HOSPITAL_COMMUNITY)
Admission: RE | Admit: 2014-05-19 | Discharge: 2014-05-19 | Disposition: A | Discharge status: Home / Self Care | Payer: Medicare Other | Source: Ambulatory Visit | Attending: Cardiology | Admitting: Cardiology

## 2014-05-19 DIAGNOSIS — Z955 Presence of coronary angioplasty implant and graft: Secondary | ICD-10-CM | POA: Diagnosis not present

## 2014-05-19 DIAGNOSIS — Z48812 Encounter for surgical aftercare following surgery on the circulatory system: Secondary | ICD-10-CM | POA: Diagnosis not present

## 2014-05-19 DIAGNOSIS — I2510 Atherosclerotic heart disease of native coronary artery without angina pectoris: Secondary | ICD-10-CM | POA: Diagnosis not present

## 2014-05-22 ENCOUNTER — Encounter (HOSPITAL_COMMUNITY)
Admission: RE | Admit: 2014-05-22 | Discharge: 2014-05-22 | Disposition: A | Discharge status: Home / Self Care | Payer: Medicare Other | Source: Ambulatory Visit | Attending: Cardiology | Admitting: Cardiology

## 2014-05-22 DIAGNOSIS — Z48812 Encounter for surgical aftercare following surgery on the circulatory system: Secondary | ICD-10-CM | POA: Diagnosis not present

## 2014-05-22 DIAGNOSIS — Z955 Presence of coronary angioplasty implant and graft: Secondary | ICD-10-CM | POA: Diagnosis not present

## 2014-05-22 DIAGNOSIS — I2510 Atherosclerotic heart disease of native coronary artery without angina pectoris: Secondary | ICD-10-CM | POA: Diagnosis not present

## 2014-05-24 ENCOUNTER — Encounter (HOSPITAL_COMMUNITY)
Admission: RE | Admit: 2014-05-24 | Discharge: 2014-05-24 | Disposition: A | Discharge status: Home / Self Care | Payer: Medicare Other | Source: Ambulatory Visit | Attending: Cardiology | Admitting: Cardiology

## 2014-05-24 DIAGNOSIS — Z48812 Encounter for surgical aftercare following surgery on the circulatory system: Secondary | ICD-10-CM | POA: Diagnosis not present

## 2014-05-24 DIAGNOSIS — I2510 Atherosclerotic heart disease of native coronary artery without angina pectoris: Secondary | ICD-10-CM | POA: Diagnosis not present

## 2014-05-24 DIAGNOSIS — Z955 Presence of coronary angioplasty implant and graft: Secondary | ICD-10-CM | POA: Diagnosis not present

## 2014-05-26 ENCOUNTER — Encounter (HOSPITAL_COMMUNITY): Payer: Medicare Other

## 2014-05-29 ENCOUNTER — Encounter (HOSPITAL_COMMUNITY)
Admission: RE | Admit: 2014-05-29 | Discharge: 2014-05-29 | Disposition: A | Discharge status: Home / Self Care | Payer: Medicare Other | Source: Ambulatory Visit | Attending: Cardiology | Admitting: Cardiology

## 2014-05-29 DIAGNOSIS — Z955 Presence of coronary angioplasty implant and graft: Secondary | ICD-10-CM | POA: Diagnosis not present

## 2014-05-29 DIAGNOSIS — I2510 Atherosclerotic heart disease of native coronary artery without angina pectoris: Secondary | ICD-10-CM | POA: Diagnosis not present

## 2014-05-29 DIAGNOSIS — Z48812 Encounter for surgical aftercare following surgery on the circulatory system: Secondary | ICD-10-CM | POA: Diagnosis not present

## 2014-05-31 ENCOUNTER — Encounter (HOSPITAL_COMMUNITY)
Admission: RE | Admit: 2014-05-31 | Discharge: 2014-05-31 | Disposition: A | Discharge status: Home / Self Care | Payer: Medicare Other | Source: Ambulatory Visit | Attending: Cardiology | Admitting: Cardiology

## 2014-05-31 DIAGNOSIS — I2510 Atherosclerotic heart disease of native coronary artery without angina pectoris: Secondary | ICD-10-CM | POA: Diagnosis not present

## 2014-05-31 DIAGNOSIS — Z48812 Encounter for surgical aftercare following surgery on the circulatory system: Secondary | ICD-10-CM | POA: Diagnosis not present

## 2014-05-31 DIAGNOSIS — Z955 Presence of coronary angioplasty implant and graft: Secondary | ICD-10-CM | POA: Diagnosis not present

## 2014-06-02 ENCOUNTER — Encounter (HOSPITAL_COMMUNITY): Payer: Medicare Other

## 2014-06-02 ENCOUNTER — Telehealth (HOSPITAL_COMMUNITY): Payer: Self-pay | Admitting: Internal Medicine

## 2014-06-02 ENCOUNTER — Telehealth: Payer: Self-pay

## 2014-06-02 NOTE — Telephone Encounter (Signed)
06/02/14 Disc From [facility] Received back from Dr. Lamonte Sakai and filed on shelf.Britt Bottom

## 2014-06-05 ENCOUNTER — Encounter (HOSPITAL_COMMUNITY): Admission: RE | Admit: 2014-06-05 | Payer: Medicare Other | Source: Ambulatory Visit

## 2014-06-05 ENCOUNTER — Telehealth (HOSPITAL_COMMUNITY): Payer: Self-pay | Admitting: *Deleted

## 2014-06-05 DIAGNOSIS — Z6829 Body mass index (BMI) 29.0-29.9, adult: Secondary | ICD-10-CM | POA: Diagnosis not present

## 2014-06-05 DIAGNOSIS — R05 Cough: Secondary | ICD-10-CM | POA: Diagnosis not present

## 2014-06-05 DIAGNOSIS — I10 Essential (primary) hypertension: Secondary | ICD-10-CM | POA: Diagnosis not present

## 2014-06-05 DIAGNOSIS — C189 Malignant neoplasm of colon, unspecified: Secondary | ICD-10-CM | POA: Diagnosis not present

## 2014-06-05 DIAGNOSIS — R0609 Other forms of dyspnea: Secondary | ICD-10-CM | POA: Diagnosis not present

## 2014-06-05 DIAGNOSIS — I2510 Atherosclerotic heart disease of native coronary artery without angina pectoris: Secondary | ICD-10-CM | POA: Diagnosis not present

## 2014-06-06 DIAGNOSIS — H35412 Lattice degeneration of retina, left eye: Secondary | ICD-10-CM | POA: Diagnosis not present

## 2014-06-06 DIAGNOSIS — H3532 Exudative age-related macular degeneration: Secondary | ICD-10-CM | POA: Diagnosis not present

## 2014-06-06 DIAGNOSIS — H43812 Vitreous degeneration, left eye: Secondary | ICD-10-CM | POA: Diagnosis not present

## 2014-06-06 DIAGNOSIS — H35372 Puckering of macula, left eye: Secondary | ICD-10-CM | POA: Diagnosis not present

## 2014-06-07 ENCOUNTER — Encounter (HOSPITAL_COMMUNITY)
Admission: RE | Admit: 2014-06-07 | Discharge: 2014-06-07 | Disposition: A | Discharge status: Home / Self Care | Payer: Medicare Other | Source: Ambulatory Visit | Attending: Cardiology | Admitting: Cardiology

## 2014-06-07 DIAGNOSIS — Z955 Presence of coronary angioplasty implant and graft: Secondary | ICD-10-CM | POA: Insufficient documentation

## 2014-06-07 DIAGNOSIS — Z48812 Encounter for surgical aftercare following surgery on the circulatory system: Secondary | ICD-10-CM | POA: Insufficient documentation

## 2014-06-07 DIAGNOSIS — I2510 Atherosclerotic heart disease of native coronary artery without angina pectoris: Secondary | ICD-10-CM | POA: Diagnosis not present

## 2014-06-09 ENCOUNTER — Encounter (HOSPITAL_COMMUNITY)
Admission: RE | Admit: 2014-06-09 | Discharge: 2014-06-09 | Disposition: A | Discharge status: Home / Self Care | Payer: Medicare Other | Source: Ambulatory Visit | Attending: Cardiology | Admitting: Cardiology

## 2014-06-09 DIAGNOSIS — I2510 Atherosclerotic heart disease of native coronary artery without angina pectoris: Secondary | ICD-10-CM | POA: Diagnosis not present

## 2014-06-09 DIAGNOSIS — Z955 Presence of coronary angioplasty implant and graft: Secondary | ICD-10-CM | POA: Diagnosis not present

## 2014-06-09 DIAGNOSIS — Z48812 Encounter for surgical aftercare following surgery on the circulatory system: Secondary | ICD-10-CM | POA: Diagnosis not present

## 2014-06-12 ENCOUNTER — Encounter (HOSPITAL_COMMUNITY)
Admission: RE | Admit: 2014-06-12 | Discharge: 2014-06-12 | Disposition: A | Discharge status: Home / Self Care | Payer: Medicare Other | Source: Ambulatory Visit | Attending: Cardiology | Admitting: Cardiology

## 2014-06-12 DIAGNOSIS — Z8589 Personal history of malignant neoplasm of other organs and systems: Secondary | ICD-10-CM | POA: Diagnosis not present

## 2014-06-12 DIAGNOSIS — C9140 Hairy cell leukemia not having achieved remission: Secondary | ICD-10-CM | POA: Diagnosis not present

## 2014-06-12 DIAGNOSIS — Z955 Presence of coronary angioplasty implant and graft: Secondary | ICD-10-CM | POA: Diagnosis not present

## 2014-06-12 DIAGNOSIS — I2510 Atherosclerotic heart disease of native coronary artery without angina pectoris: Secondary | ICD-10-CM | POA: Diagnosis not present

## 2014-06-12 DIAGNOSIS — C499 Malignant neoplasm of connective and soft tissue, unspecified: Secondary | ICD-10-CM | POA: Diagnosis not present

## 2014-06-12 DIAGNOSIS — Z85038 Personal history of other malignant neoplasm of large intestine: Secondary | ICD-10-CM | POA: Diagnosis not present

## 2014-06-12 DIAGNOSIS — Z48812 Encounter for surgical aftercare following surgery on the circulatory system: Secondary | ICD-10-CM | POA: Diagnosis not present

## 2014-06-14 ENCOUNTER — Encounter (HOSPITAL_COMMUNITY)
Admission: RE | Admit: 2014-06-14 | Discharge: 2014-06-14 | Disposition: A | Discharge status: Home / Self Care | Payer: Medicare Other | Source: Ambulatory Visit | Attending: Cardiology | Admitting: Cardiology

## 2014-06-14 DIAGNOSIS — I2510 Atherosclerotic heart disease of native coronary artery without angina pectoris: Secondary | ICD-10-CM | POA: Diagnosis not present

## 2014-06-14 DIAGNOSIS — Z955 Presence of coronary angioplasty implant and graft: Secondary | ICD-10-CM | POA: Diagnosis not present

## 2014-06-14 DIAGNOSIS — Z48812 Encounter for surgical aftercare following surgery on the circulatory system: Secondary | ICD-10-CM | POA: Diagnosis not present

## 2014-06-16 ENCOUNTER — Encounter (HOSPITAL_COMMUNITY)
Admission: RE | Admit: 2014-06-16 | Discharge: 2014-06-16 | Disposition: A | Discharge status: Home / Self Care | Payer: Medicare Other | Source: Ambulatory Visit | Attending: Cardiology | Admitting: Cardiology

## 2014-06-16 DIAGNOSIS — I2510 Atherosclerotic heart disease of native coronary artery without angina pectoris: Secondary | ICD-10-CM | POA: Diagnosis not present

## 2014-06-16 DIAGNOSIS — Z955 Presence of coronary angioplasty implant and graft: Secondary | ICD-10-CM | POA: Diagnosis not present

## 2014-06-16 DIAGNOSIS — Z48812 Encounter for surgical aftercare following surgery on the circulatory system: Secondary | ICD-10-CM | POA: Diagnosis not present

## 2014-06-16 NOTE — Progress Notes (Signed)
[name] 81 y.o. male Nutrition Note Spoke with pt.  Nutrition Survey reviewed with pt. Pt is not currently following the Therapeutic Lifestyle Changes diet. Age-appropriate diet recommendations discussed. Pt wants to lose wt. Pt has been trying to lose wt by "decreasing sweets and not adding salt to anything." Wt loss tips briefly reviewed. Pt states he wants to weigh around 157-160 lb. Pt wt today is down 2.6 kg (5.7 lb) over the past 6 weeks. Rate of wt loss appears safe. Pt expressed understanding of the information reviewed. Pt aware of nutrition education classes offered.  Nutrition Diagnosis ? Food-and nutrition-related knowledge deficit related to lack of exposure to information as related to diagnosis of: ? CVD  ? Overweight related to excessive energy intake as evidenced by a BMI of 29.8  Nutrition Intervention ? Benefits of adopting Therapeutic Lifestyle Changes discussed when Medficts reviewed. ? Pt to attend the Portion Distortion class ? Pt to attend the  ? Nutrition I class - met; 05/02/14                     ? Nutrition II class ? Continue client-centered nutrition education by RD, as part of interdisciplinary care.  Goal(s) ? Pt to identify food quantities necessary to achieve: ? wt loss to a goal wt of 157-160 lb (71.4-72.7 kg) at graduation from cardiac rehab.  ? Pt to describe the benefit of including fruits, vegetables, whole grains, and low-fat dairy products in a heart healthy meal plan.  Monitor and Evaluate progress toward nutrition goal with team.   Derek Mound, M.Ed, RD, LDN, CDE 06/16/2014 1:25 PM

## 2014-06-19 ENCOUNTER — Encounter (HOSPITAL_COMMUNITY)
Admission: RE | Admit: 2014-06-19 | Discharge: 2014-06-19 | Disposition: A | Discharge status: Home / Self Care | Payer: Medicare Other | Source: Ambulatory Visit | Attending: Cardiology | Admitting: Cardiology

## 2014-06-19 DIAGNOSIS — Z48812 Encounter for surgical aftercare following surgery on the circulatory system: Secondary | ICD-10-CM | POA: Diagnosis not present

## 2014-06-19 DIAGNOSIS — Z955 Presence of coronary angioplasty implant and graft: Secondary | ICD-10-CM | POA: Diagnosis not present

## 2014-06-19 DIAGNOSIS — H02102 Unspecified ectropion of right lower eyelid: Secondary | ICD-10-CM | POA: Diagnosis not present

## 2014-06-19 DIAGNOSIS — I2510 Atherosclerotic heart disease of native coronary artery without angina pectoris: Secondary | ICD-10-CM | POA: Diagnosis not present

## 2014-06-21 ENCOUNTER — Encounter (HOSPITAL_COMMUNITY)
Admission: RE | Admit: 2014-06-21 | Discharge: 2014-06-21 | Disposition: A | Discharge status: Home / Self Care | Payer: Medicare Other | Source: Ambulatory Visit | Attending: Cardiology | Admitting: Cardiology

## 2014-06-21 DIAGNOSIS — I2510 Atherosclerotic heart disease of native coronary artery without angina pectoris: Secondary | ICD-10-CM | POA: Diagnosis not present

## 2014-06-21 DIAGNOSIS — Z955 Presence of coronary angioplasty implant and graft: Secondary | ICD-10-CM | POA: Diagnosis not present

## 2014-06-21 DIAGNOSIS — Z48812 Encounter for surgical aftercare following surgery on the circulatory system: Secondary | ICD-10-CM | POA: Diagnosis not present

## 2014-06-22 ENCOUNTER — Other Ambulatory Visit: Payer: Medicare Other

## 2014-06-22 ENCOUNTER — Ambulatory Visit (INDEPENDENT_AMBULATORY_CARE_PROVIDER_SITE_OTHER): Payer: Medicare Other | Admitting: Cardiology

## 2014-06-22 ENCOUNTER — Encounter: Payer: Self-pay | Admitting: Cardiology

## 2014-06-22 VITALS — BP 136/84 | HR 65 | Ht 66.0 in | Wt 169.0 lb

## 2014-06-22 DIAGNOSIS — E7800 Pure hypercholesterolemia, unspecified: Secondary | ICD-10-CM

## 2014-06-22 DIAGNOSIS — K5909 Other constipation: Secondary | ICD-10-CM | POA: Diagnosis not present

## 2014-06-22 DIAGNOSIS — Z955 Presence of coronary angioplasty implant and graft: Secondary | ICD-10-CM | POA: Diagnosis not present

## 2014-06-22 DIAGNOSIS — R5383 Other fatigue: Secondary | ICD-10-CM | POA: Diagnosis not present

## 2014-06-22 DIAGNOSIS — K5900 Constipation, unspecified: Secondary | ICD-10-CM | POA: Insufficient documentation

## 2014-06-22 DIAGNOSIS — I259 Chronic ischemic heart disease, unspecified: Secondary | ICD-10-CM | POA: Diagnosis not present

## 2014-06-22 DIAGNOSIS — R5381 Other malaise: Secondary | ICD-10-CM

## 2014-06-22 LAB — HEPATIC FUNCTION PANEL
ALT: 25 U/L (ref 0–53)
AST: 27 U/L (ref 0–37)
Albumin: 3.5 g/dL (ref 3.5–5.2)
Alkaline Phosphatase: 91 U/L (ref 39–117)
Bilirubin, Direct: 0.1 mg/dL (ref 0.0–0.3)
Total Bilirubin: 0.6 mg/dL (ref 0.2–1.2)
Total Protein: 6.8 g/dL (ref 6.0–8.3)

## 2014-06-22 LAB — LIPID PANEL
Cholesterol: 111 mg/dL (ref 0–200)
HDL: 29.1 mg/dL — ABNORMAL LOW (ref >39.00)
LDL Cholesterol: 54 mg/dL (ref 0–99)
NonHDL: 81.9
Total CHOL/HDL Ratio: 4
Triglycerides: 140 mg/dL (ref 0.0–149.0)
VLDL: 28 mg/dL (ref 0.0–40.0)

## 2014-06-22 LAB — BASIC METABOLIC PANEL
BUN: 22 mg/dL (ref 6–23)
CO2: 26 mEq/L (ref 19–32)
Calcium: 9.1 mg/dL (ref 8.4–10.5)
Chloride: 106 mEq/L (ref 96–112)
Creatinine, Ser: 0.92 mg/dL (ref 0.40–1.50)
GFR: 82.45 mL/min (ref >60.00)
Glucose, Bld: 94 mg/dL (ref 70–99)
Potassium: 3.7 mEq/L (ref 3.5–5.1)
Sodium: 139 mEq/L (ref 135–145)

## 2014-06-22 LAB — CBC WITH DIFFERENTIAL/PLATELET
Basophils Absolute: 0 10*3/uL (ref 0.0–0.1)
Basophils Relative: 0.5 % (ref 0.0–3.0)
Eosinophils Absolute: 0.2 10*3/uL (ref 0.0–0.7)
Eosinophils Relative: 3.6 % (ref 0.0–5.0)
HCT: 38.2 % — ABNORMAL LOW (ref 39.0–52.0)
Hemoglobin: 12.7 g/dL — ABNORMAL LOW (ref 13.0–17.0)
Lymphocytes Relative: 17.8 % (ref 12.0–46.0)
Lymphs Abs: 1.1 10*3/uL (ref 0.7–4.0)
MCHC: 33.1 g/dL (ref 30.0–36.0)
MCV: 86.7 fl (ref 78.0–100.0)
Monocytes Absolute: 1.2 10*3/uL — ABNORMAL HIGH (ref 0.1–1.0)
Monocytes Relative: 19.7 % — ABNORMAL HIGH (ref 3.0–12.0)
Neutro Abs: 3.6 10*3/uL (ref 1.4–7.7)
Neutrophils Relative %: 58.4 % (ref 43.0–77.0)
Platelets: 230 10*3/uL (ref 150.0–400.0)
RBC: 4.41 Mil/uL (ref 4.22–5.81)
RDW: 15.5 % (ref 11.5–15.5)
WBC: 6.2 10*3/uL (ref 4.0–10.5)

## 2014-06-22 LAB — TSH: TSH: 2.16 u[IU]/mL (ref 0.35–4.50)

## 2014-06-22 LAB — T4, FREE: Free T4: 0.99 ng/dL (ref 0.60–1.60)

## 2014-06-22 NOTE — Patient Instructions (Signed)
Medication Instructions:  STOP FISH OIL   Labwork: FT4/TSH/LP/BMET/HFP  Testing/Procedures: NONE  Follow-Up: Your physician recommends that you schedule a follow-up appointment in: 4 months with fasting labs (lp/bmet/hfp)

## 2014-06-22 NOTE — Progress Notes (Signed)
Quick Note:  Please report to patient. The recent labs are stable. Continue same medication and careful diet. The thyroid is normal. ______

## 2014-06-22 NOTE — Progress Notes (Signed)
Cardiology Office Note   Date:  06/22/2014   ID:  [name], DOB 1924-02-22, MRN [id number]  PCP:  [name], MD  Cardiologist:   Darlin Coco, MD   No chief complaint on file.     History of Present Illness: [name] is a 81 y.o. male who presents for a scheduled follow-up visit.  [name] is a 81 y.o. male who presents today for a follow-up office visit. Seen for Dr. Mare Ferrari. 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   Most recently presented to [facility] on 03/14/14 for planned coronary angiography after an abnormal outpatient nuclear stress test. Had PCI with DES x 2 to the mid RCA due to subtotal occlusion. Has normal EF. On DAPT for one year. Noted to have residual disease in the LAD which will be managed medically. Low dose beta blocker and Imdur added at discharge.   Comes in today. Here with his wife. Doing well. He is hard of hearing. No chest pain. Breathing is ok. Still gets some DOE but it is improving. He is already walking 15 minutes twice a day. Wants to go to cardiac rehab. Tolerating his medicines. May need to have some Moh's surgery - will need to have dermatology talk with [place]. He did go to the dentist yesterday and they post treated him with antibiotic for his cleaning. No indication for SBE.   Past Medical History  Diagnosis Date  . 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 Compression fracture of lumbar spine, non-traumatic   . Anemia   . Malnutrition   . Hypertension   . GERD (gastroesophageal reflux disease)   . Elevated PSA   . Glaucoma   . Acute appendicitis   . Osteoporosis   . Dyslipidemia   . History of blood transfusion     "several; related to hairy cell leukemia & tx  "  . History of stomach ulcers 1968  . DJD (degenerative joint disease) of lumbar spine   . Osteoarthritis   . Arthritis     "back" (03/14/2014)  . Anxiety   . Hairy cell leukemia dx'd 1980  . Colon cancer 1984  . Skin cancer of face   . CAD (coronary artery disease)     a. 03/14/14  s/p overlapping DES x2 to mid-distal RCA.    Past Surgical History  Procedure Laterality Date  . Splenectomy  1992  . Mole removal Left 1985    cheek  . Orif ankle fracture Left 2000  . Shoulder surgery  04/2004  . Cataract extraction, bilateral Bilateral 02/2008  . Mohs surgery Left 02/2011  . Lipoma excision Right 07/2011    liposarcoma resection; "back"  . Coronary angioplasty with stent placement  03/14/2014    "2"  . Appendectomy  10/2007  . Tonsillectomy and adenoidectomy  1939  . Fracture surgery    . Colon surgery  1984    "sigmoid; open"  . Mohs surgery  X 3    "all on my face"  . Left heart catheterization with coronary angiogram N/A 03/14/2014    Procedure: LEFT HEART CATHETERIZATION WITH CORONARY ANGIOGRAM;  Surgeon: Peter M Martinique, MD;  Location: [facility] CATH LAB;  Service: Cardiovascular;  Laterality: N/A;     Current Outpatient Prescriptions  Medication  Sig Dispense Refill  . aspirin 81 MG tablet Take 81 mg by mouth daily.    Marland Kitchen atorvastatin (LIPITOR) 20 MG tablet Take 1 tablet (20 mg total) by mouth daily at 6 PM. 90 tablet 3  . Cholecalciferol (VITAMIN D-3 PO) Take 2,000 Units by mouth daily.     . clopidogrel (PLAVIX) 75 MG tablet Take 1 tablet (75 mg total) by mouth daily with breakfast. 90 tablet 3  . erythromycin ophthalmic ointment Place 1 application into the right eye daily.  98  . fluocinonide (LIDEX) 0.05 % external solution Apply 1 application topically daily as needed. As needed for dermatitis on scalp and ears    . isosorbide mononitrate (IMDUR) 30 MG 24 hr tablet Take 0.5 tablets (15 mg total) by mouth daily. 45 tablet 3  . LATANOPROST OP Apply 1 drop to eye at bedtime. In  each eye    . metoprolol tartrate (LOPRESSOR) 25 MG tablet Take 12.5 mg by mouth daily.    Marland Kitchen NITROSTAT 0.4 MG SL tablet Place 0.4 mg under the tongue every 5 (five) minutes as needed for chest pain.   12  . pantoprazole (PROTONIX) 40 MG tablet Take 1 tablet (40 mg total) by mouth daily. 90 tablet 3  . sertraline (ZOLOFT) 50 MG tablet Take 50 mg by mouth daily.    . tamsulosin (FLOMAX) 0.4 MG CAPS capsule Take 0.8 mg by mouth daily after supper.     . timolol (BETIMOL) 0.5 % ophthalmic solution Place 1 drop into the right eye daily.      No current facility-administered medications for this visit.    Allergies:   Antazoline; Antihistamines, chlorpheniramine-type; and Sulfa antibiotics    Social History:  The patient  reports that he has quit smoking. His smoking use included Cigarettes and Cigars. He has a .25 pack-year smoking history. He has never used smokeless tobacco. He reports that he drinks about 5.4 oz of alcohol per week. He reports that he does not use illicit drugs.   Family History:  The patient's family history includes Congestive Heart Failure (age of onset: 72) in his father; Diabetes Mellitus II in his brother; Heart Problems in his brother; Prostate cancer in his brother; Stroke in his mother.    ROS:  Please see the history of present illness.   Otherwise, review of systems are positive for constipation and malaise and fatigue.  We'll check thyroid function to rule out hypothyroidism.   All other systems are reviewed and negative.    PHYSICAL EXAM: VS:  BP 136/84 mmHg  Pulse 65  Ht 5\' 6"  (1.676 m)  Wt 169 lb (76.658 kg)  BMI 27.29 kg/m2 , BMI Body mass index is 27.29 kg/(m^2). GEN: Well nourished, well developed, in no acute distress HEENT: normal Neck: no JVD, carotid bruits, or masses Cardiac: RRR; no murmurs, rubs, or gallops,no edema  Respiratory:  clear to auscultation bilaterally, normal work of breathing GI: soft, nontender, nondistended, + BS MS: no  deformity or atrophy Skin: warm and dry, no rash Neuro:  Strength and sensation are intact Psych: euthymic mood, full affect   EKG:  EKG is not ordered today.    Recent Labs: 03/29/2014: ALT 17; BUN 16; Creatinine 0.93; Hemoglobin 12.6*; Platelets 165.0; Potassium 3.8; Sodium 140    Lipid Panel No results found for: CHOL, TRIG, HDL, CHOLHDL, VLDL, LDLCALC, LDLDIRECT    Wt Readings from Last 3 Encounters:  06/22/14 169 lb (76.658 kg)  04/20/14 176 lb 2.4 oz (79.9 kg)  03/29/14 176 lb 12.8 oz (80.196 kg)         ASSESSMENT AND PLAN:  1. CAD - recent chest pain/abnormal myoview - now s/p cath with PCI to the mid RCA. Normal LV function. Residual LAD disease to be treated medically. He is doing very well clinically.  He is in cardiac rehabilitation and is enjoying it very much.  Still has some exertional dyspnea.  He was hoping that his dyspnea would be lessened more after his PCI.  2. HTN - able  3. HLD - on statin -we are checking lipids today.  We will stop fish oil because he is having excessive bleeding  4.  Malaise and fatigue.  Complaints of constipation and decreased energy.  Rule out hypothyroidism.  5.  History of hairy cell leukemia followed at [facility]  Current medicines are reviewed with the patient today. The patient does not have concerns regarding medicines.    Current medicines are reviewed at length with the patient today.  The patient has concerns regarding medicines.  The following changes have been made:  Stop fish oil because of an increased bleeding and bruising of the skin  Labs/ tests ordered today include:   Orders Placed This Encounter  Procedures  . TSH  . CBC with Differential/Platelet  . Basic metabolic panel  . Hepatic function panel  . T4, free  . Lipid panel  . Lipid panel  . Hepatic function panel  . Basic metabolic panel     Disposition: Continue present regimen.  Lab work including thyroid function being checked today.   Recheck in 4 months for follow-up office visit EKG lipid panel hepatic function panel and basal metabolic panel. I encouraged him to consider the maintenance program of the cardiac rehabilitation program  Signed, Darlin Coco, MD  06/22/2014 10:13 AM    [facility] [place], [place], [place]  [address] Phone: [phone]; Fax: [phone]

## 2014-06-23 ENCOUNTER — Encounter (HOSPITAL_COMMUNITY)
Admission: RE | Admit: 2014-06-23 | Discharge: 2014-06-23 | Disposition: A | Discharge status: Home / Self Care | Payer: Medicare Other | Source: Ambulatory Visit | Attending: Cardiology | Admitting: Cardiology

## 2014-06-23 DIAGNOSIS — Z955 Presence of coronary angioplasty implant and graft: Secondary | ICD-10-CM | POA: Diagnosis not present

## 2014-06-23 DIAGNOSIS — Z48812 Encounter for surgical aftercare following surgery on the circulatory system: Secondary | ICD-10-CM | POA: Diagnosis not present

## 2014-06-23 DIAGNOSIS — I2510 Atherosclerotic heart disease of native coronary artery without angina pectoris: Secondary | ICD-10-CM | POA: Diagnosis not present

## 2014-06-26 ENCOUNTER — Encounter (HOSPITAL_COMMUNITY)
Admission: RE | Admit: 2014-06-26 | Discharge: 2014-06-26 | Disposition: A | Discharge status: Home / Self Care | Payer: Medicare Other | Source: Ambulatory Visit | Attending: Cardiology | Admitting: Cardiology

## 2014-06-26 DIAGNOSIS — Z48812 Encounter for surgical aftercare following surgery on the circulatory system: Secondary | ICD-10-CM | POA: Diagnosis not present

## 2014-06-26 DIAGNOSIS — I2510 Atherosclerotic heart disease of native coronary artery without angina pectoris: Secondary | ICD-10-CM | POA: Diagnosis not present

## 2014-06-26 DIAGNOSIS — Z955 Presence of coronary angioplasty implant and graft: Secondary | ICD-10-CM | POA: Diagnosis not present

## 2014-06-28 ENCOUNTER — Encounter (HOSPITAL_COMMUNITY)
Admission: RE | Admit: 2014-06-28 | Discharge: 2014-06-28 | Disposition: A | Discharge status: Home / Self Care | Payer: Medicare Other | Source: Ambulatory Visit | Attending: Cardiology | Admitting: Cardiology

## 2014-06-28 DIAGNOSIS — Z955 Presence of coronary angioplasty implant and graft: Secondary | ICD-10-CM | POA: Diagnosis not present

## 2014-06-28 DIAGNOSIS — I2510 Atherosclerotic heart disease of native coronary artery without angina pectoris: Secondary | ICD-10-CM | POA: Diagnosis not present

## 2014-06-28 DIAGNOSIS — Z48812 Encounter for surgical aftercare following surgery on the circulatory system: Secondary | ICD-10-CM | POA: Diagnosis not present

## 2014-06-30 ENCOUNTER — Encounter (HOSPITAL_COMMUNITY): Payer: Medicare Other

## 2014-07-03 ENCOUNTER — Encounter (HOSPITAL_COMMUNITY)
Admission: RE | Admit: 2014-07-03 | Discharge: 2014-07-03 | Disposition: A | Discharge status: Home / Self Care | Payer: Medicare Other | Source: Ambulatory Visit | Attending: Cardiology | Admitting: Cardiology

## 2014-07-03 DIAGNOSIS — Z955 Presence of coronary angioplasty implant and graft: Secondary | ICD-10-CM | POA: Diagnosis not present

## 2014-07-03 DIAGNOSIS — Z48812 Encounter for surgical aftercare following surgery on the circulatory system: Secondary | ICD-10-CM | POA: Insufficient documentation

## 2014-07-03 DIAGNOSIS — I2510 Atherosclerotic heart disease of native coronary artery without angina pectoris: Secondary | ICD-10-CM | POA: Diagnosis not present

## 2014-07-04 DIAGNOSIS — H3532 Exudative age-related macular degeneration: Secondary | ICD-10-CM | POA: Diagnosis not present

## 2014-07-04 DIAGNOSIS — H43812 Vitreous degeneration, left eye: Secondary | ICD-10-CM | POA: Diagnosis not present

## 2014-07-04 DIAGNOSIS — H35412 Lattice degeneration of retina, left eye: Secondary | ICD-10-CM | POA: Diagnosis not present

## 2014-07-04 DIAGNOSIS — H35371 Puckering of macula, right eye: Secondary | ICD-10-CM | POA: Diagnosis not present

## 2014-07-05 ENCOUNTER — Encounter (HOSPITAL_COMMUNITY)
Admission: RE | Admit: 2014-07-05 | Discharge: 2014-07-05 | Disposition: A | Discharge status: Home / Self Care | Payer: Medicare Other | Source: Ambulatory Visit | Attending: Cardiology | Admitting: Cardiology

## 2014-07-05 DIAGNOSIS — Z48812 Encounter for surgical aftercare following surgery on the circulatory system: Secondary | ICD-10-CM | POA: Diagnosis not present

## 2014-07-05 DIAGNOSIS — I2510 Atherosclerotic heart disease of native coronary artery without angina pectoris: Secondary | ICD-10-CM | POA: Diagnosis not present

## 2014-07-05 DIAGNOSIS — Z955 Presence of coronary angioplasty implant and graft: Secondary | ICD-10-CM | POA: Diagnosis not present

## 2014-07-07 ENCOUNTER — Encounter (HOSPITAL_COMMUNITY)
Admission: RE | Admit: 2014-07-07 | Discharge: 2014-07-07 | Disposition: A | Discharge status: Home / Self Care | Payer: Medicare Other | Source: Ambulatory Visit | Attending: Cardiology | Admitting: Cardiology

## 2014-07-07 DIAGNOSIS — I2510 Atherosclerotic heart disease of native coronary artery without angina pectoris: Secondary | ICD-10-CM | POA: Diagnosis not present

## 2014-07-07 DIAGNOSIS — Z48812 Encounter for surgical aftercare following surgery on the circulatory system: Secondary | ICD-10-CM | POA: Diagnosis not present

## 2014-07-07 DIAGNOSIS — Z955 Presence of coronary angioplasty implant and graft: Secondary | ICD-10-CM | POA: Diagnosis not present

## 2014-07-10 ENCOUNTER — Encounter (HOSPITAL_COMMUNITY)
Admission: RE | Admit: 2014-07-10 | Discharge: 2014-07-10 | Disposition: A | Discharge status: Home / Self Care | Payer: Medicare Other | Source: Ambulatory Visit | Attending: Cardiology | Admitting: Cardiology

## 2014-07-10 DIAGNOSIS — Z48812 Encounter for surgical aftercare following surgery on the circulatory system: Secondary | ICD-10-CM | POA: Diagnosis not present

## 2014-07-10 DIAGNOSIS — I2510 Atherosclerotic heart disease of native coronary artery without angina pectoris: Secondary | ICD-10-CM | POA: Diagnosis not present

## 2014-07-10 DIAGNOSIS — Z955 Presence of coronary angioplasty implant and graft: Secondary | ICD-10-CM | POA: Diagnosis not present

## 2014-07-12 ENCOUNTER — Encounter (HOSPITAL_COMMUNITY)
Admission: RE | Admit: 2014-07-12 | Discharge: 2014-07-12 | Disposition: A | Discharge status: Home / Self Care | Payer: Medicare Other | Source: Ambulatory Visit | Attending: Cardiology | Admitting: Cardiology

## 2014-07-12 DIAGNOSIS — Z955 Presence of coronary angioplasty implant and graft: Secondary | ICD-10-CM | POA: Diagnosis not present

## 2014-07-12 DIAGNOSIS — Z48812 Encounter for surgical aftercare following surgery on the circulatory system: Secondary | ICD-10-CM | POA: Diagnosis not present

## 2014-07-12 DIAGNOSIS — I2510 Atherosclerotic heart disease of native coronary artery without angina pectoris: Secondary | ICD-10-CM | POA: Diagnosis not present

## 2014-07-14 ENCOUNTER — Encounter (HOSPITAL_COMMUNITY)
Admission: RE | Admit: 2014-07-14 | Discharge: 2014-07-14 | Disposition: A | Discharge status: Home / Self Care | Payer: Medicare Other | Source: Ambulatory Visit | Attending: Cardiology | Admitting: Cardiology

## 2014-07-14 DIAGNOSIS — Z955 Presence of coronary angioplasty implant and graft: Secondary | ICD-10-CM | POA: Diagnosis not present

## 2014-07-14 DIAGNOSIS — I2510 Atherosclerotic heart disease of native coronary artery without angina pectoris: Secondary | ICD-10-CM | POA: Diagnosis not present

## 2014-07-14 DIAGNOSIS — Z48812 Encounter for surgical aftercare following surgery on the circulatory system: Secondary | ICD-10-CM | POA: Diagnosis not present

## 2014-07-17 ENCOUNTER — Encounter (HOSPITAL_COMMUNITY)
Admission: RE | Admit: 2014-07-17 | Discharge: 2014-07-17 | Disposition: A | Discharge status: Home / Self Care | Payer: Medicare Other | Source: Ambulatory Visit | Attending: Cardiology | Admitting: Cardiology

## 2014-07-17 DIAGNOSIS — I2510 Atherosclerotic heart disease of native coronary artery without angina pectoris: Secondary | ICD-10-CM | POA: Diagnosis not present

## 2014-07-17 DIAGNOSIS — Z955 Presence of coronary angioplasty implant and graft: Secondary | ICD-10-CM | POA: Diagnosis not present

## 2014-07-17 DIAGNOSIS — Z48812 Encounter for surgical aftercare following surgery on the circulatory system: Secondary | ICD-10-CM | POA: Diagnosis not present

## 2014-07-19 ENCOUNTER — Encounter (HOSPITAL_COMMUNITY)
Admission: RE | Admit: 2014-07-19 | Discharge: 2014-07-19 | Disposition: A | Discharge status: Home / Self Care | Payer: Medicare Other | Source: Ambulatory Visit | Attending: Cardiology | Admitting: Cardiology

## 2014-07-19 DIAGNOSIS — Z48812 Encounter for surgical aftercare following surgery on the circulatory system: Secondary | ICD-10-CM | POA: Diagnosis not present

## 2014-07-19 DIAGNOSIS — I2510 Atherosclerotic heart disease of native coronary artery without angina pectoris: Secondary | ICD-10-CM | POA: Diagnosis not present

## 2014-07-19 DIAGNOSIS — Z955 Presence of coronary angioplasty implant and graft: Secondary | ICD-10-CM | POA: Diagnosis not present

## 2014-07-21 ENCOUNTER — Encounter (HOSPITAL_COMMUNITY)
Admission: RE | Admit: 2014-07-21 | Discharge: 2014-07-21 | Disposition: A | Discharge status: Home / Self Care | Payer: Medicare Other | Source: Ambulatory Visit | Attending: Cardiology | Admitting: Cardiology

## 2014-07-21 DIAGNOSIS — Z48812 Encounter for surgical aftercare following surgery on the circulatory system: Secondary | ICD-10-CM | POA: Diagnosis not present

## 2014-07-21 DIAGNOSIS — I2510 Atherosclerotic heart disease of native coronary artery without angina pectoris: Secondary | ICD-10-CM | POA: Diagnosis not present

## 2014-07-21 DIAGNOSIS — Z955 Presence of coronary angioplasty implant and graft: Secondary | ICD-10-CM | POA: Diagnosis not present

## 2014-07-24 ENCOUNTER — Encounter (HOSPITAL_COMMUNITY)
Admission: RE | Admit: 2014-07-24 | Discharge: 2014-07-24 | Disposition: A | Discharge status: Home / Self Care | Payer: Medicare Other | Source: Ambulatory Visit | Attending: Cardiology | Admitting: Cardiology

## 2014-07-24 DIAGNOSIS — Z955 Presence of coronary angioplasty implant and graft: Secondary | ICD-10-CM | POA: Diagnosis not present

## 2014-07-24 DIAGNOSIS — Z48812 Encounter for surgical aftercare following surgery on the circulatory system: Secondary | ICD-10-CM | POA: Diagnosis not present

## 2014-07-24 DIAGNOSIS — I2510 Atherosclerotic heart disease of native coronary artery without angina pectoris: Secondary | ICD-10-CM | POA: Diagnosis not present

## 2014-07-26 ENCOUNTER — Encounter (HOSPITAL_COMMUNITY): Payer: Medicare Other

## 2014-07-28 ENCOUNTER — Encounter (HOSPITAL_COMMUNITY): Payer: Medicare Other

## 2014-07-31 ENCOUNTER — Encounter (HOSPITAL_COMMUNITY): Payer: Medicare Other

## 2014-08-01 DIAGNOSIS — H35412 Lattice degeneration of retina, left eye: Secondary | ICD-10-CM | POA: Diagnosis not present

## 2014-08-01 DIAGNOSIS — H3532 Exudative age-related macular degeneration: Secondary | ICD-10-CM | POA: Diagnosis not present

## 2014-08-01 DIAGNOSIS — H35371 Puckering of macula, right eye: Secondary | ICD-10-CM | POA: Diagnosis not present

## 2014-08-02 ENCOUNTER — Encounter (HOSPITAL_COMMUNITY)
Admission: RE | Admit: 2014-08-02 | Discharge: 2014-08-02 | Disposition: A | Discharge status: Home / Self Care | Payer: Medicare Other | Source: Ambulatory Visit | Attending: Cardiology | Admitting: Cardiology

## 2014-08-02 DIAGNOSIS — I2510 Atherosclerotic heart disease of native coronary artery without angina pectoris: Secondary | ICD-10-CM | POA: Insufficient documentation

## 2014-08-02 DIAGNOSIS — Z955 Presence of coronary angioplasty implant and graft: Secondary | ICD-10-CM | POA: Insufficient documentation

## 2014-08-02 DIAGNOSIS — Z48812 Encounter for surgical aftercare following surgery on the circulatory system: Secondary | ICD-10-CM | POA: Insufficient documentation

## 2014-08-04 ENCOUNTER — Encounter (HOSPITAL_COMMUNITY)
Admission: RE | Admit: 2014-08-04 | Discharge: 2014-08-04 | Disposition: A | Discharge status: Home / Self Care | Payer: Medicare Other | Source: Ambulatory Visit | Attending: Cardiology | Admitting: Cardiology

## 2014-08-04 DIAGNOSIS — Z955 Presence of coronary angioplasty implant and graft: Secondary | ICD-10-CM | POA: Diagnosis not present

## 2014-08-04 DIAGNOSIS — I2510 Atherosclerotic heart disease of native coronary artery without angina pectoris: Secondary | ICD-10-CM | POA: Diagnosis not present

## 2014-08-04 DIAGNOSIS — Z48812 Encounter for surgical aftercare following surgery on the circulatory system: Secondary | ICD-10-CM | POA: Diagnosis not present

## 2014-08-07 ENCOUNTER — Encounter (HOSPITAL_COMMUNITY)
Admission: RE | Admit: 2014-08-07 | Discharge: 2014-08-07 | Disposition: A | Discharge status: Home / Self Care | Payer: Medicare Other | Source: Ambulatory Visit | Attending: Cardiology | Admitting: Cardiology

## 2014-08-07 DIAGNOSIS — Z48812 Encounter for surgical aftercare following surgery on the circulatory system: Secondary | ICD-10-CM | POA: Diagnosis not present

## 2014-08-07 DIAGNOSIS — Z955 Presence of coronary angioplasty implant and graft: Secondary | ICD-10-CM | POA: Diagnosis not present

## 2014-08-07 DIAGNOSIS — I2510 Atherosclerotic heart disease of native coronary artery without angina pectoris: Secondary | ICD-10-CM | POA: Diagnosis not present

## 2014-08-07 NOTE — Progress Notes (Signed)
Pt graduated from cardiac rehab program today with completion of 36 exercise sessions in Phase II. Pt maintained good attendance and progressed nicely during his participation in rehab as evidenced by increased MET level.   Medication list reconciled. Repeat  PHQ score-0  .  Pt has made significant lifestyle changes and should be commended for his success. Pt feels he has achieved his goals during cardiac rehab.   Pt [place] and walking on his own at home. [name] is happy that he has lost weight while participating in cardiac rehab. We are very proud of [name]'s accomplishments!!

## 2014-08-09 ENCOUNTER — Encounter (HOSPITAL_COMMUNITY): Payer: Medicare Other

## 2014-08-11 ENCOUNTER — Encounter (HOSPITAL_COMMUNITY): Payer: Medicare Other

## 2014-08-14 ENCOUNTER — Encounter (HOSPITAL_COMMUNITY): Payer: Medicare Other

## 2014-08-16 ENCOUNTER — Encounter (HOSPITAL_COMMUNITY): Payer: Medicare Other

## 2014-08-18 ENCOUNTER — Encounter (HOSPITAL_COMMUNITY): Payer: Medicare Other

## 2014-08-21 ENCOUNTER — Encounter (HOSPITAL_COMMUNITY): Payer: Medicare Other

## 2014-08-23 ENCOUNTER — Encounter (HOSPITAL_COMMUNITY): Payer: Medicare Other

## 2014-08-25 ENCOUNTER — Encounter (HOSPITAL_COMMUNITY): Payer: Medicare Other

## 2014-09-11 DIAGNOSIS — H43812 Vitreous degeneration, left eye: Secondary | ICD-10-CM | POA: Diagnosis not present

## 2014-09-11 DIAGNOSIS — H3532 Exudative age-related macular degeneration: Secondary | ICD-10-CM | POA: Diagnosis not present

## 2014-09-11 DIAGNOSIS — H35412 Lattice degeneration of retina, left eye: Secondary | ICD-10-CM | POA: Diagnosis not present

## 2014-09-12 DIAGNOSIS — C44311 Basal cell carcinoma of skin of nose: Secondary | ICD-10-CM | POA: Diagnosis not present

## 2014-09-26 DIAGNOSIS — L820 Inflamed seborrheic keratosis: Secondary | ICD-10-CM | POA: Diagnosis not present

## 2014-09-26 DIAGNOSIS — L308 Other specified dermatitis: Secondary | ICD-10-CM | POA: Diagnosis not present

## 2014-09-26 DIAGNOSIS — L218 Other seborrheic dermatitis: Secondary | ICD-10-CM | POA: Diagnosis not present

## 2014-09-26 DIAGNOSIS — L918 Other hypertrophic disorders of the skin: Secondary | ICD-10-CM | POA: Diagnosis not present

## 2014-10-11 DIAGNOSIS — H43812 Vitreous degeneration, left eye: Secondary | ICD-10-CM | POA: Diagnosis not present

## 2014-10-11 DIAGNOSIS — H3531 Nonexudative age-related macular degeneration: Secondary | ICD-10-CM | POA: Diagnosis not present

## 2014-10-11 DIAGNOSIS — H3341 Traction detachment of retina, right eye: Secondary | ICD-10-CM | POA: Diagnosis not present

## 2014-10-11 DIAGNOSIS — H3532 Exudative age-related macular degeneration: Secondary | ICD-10-CM | POA: Diagnosis not present

## 2014-10-17 ENCOUNTER — Telehealth: Payer: Self-pay | Admitting: *Deleted

## 2014-10-17 NOTE — Telephone Encounter (Signed)
Dental Issues/infection - to see dentist again today.  Multiple rounds of antibiotics over the past 6-7 weeks.  Pt wondering is this could be a staph infection like he had before, approx 2008-2009.  Will discuss this possibility with his dentist, Dr. Mariea Clonts, today, request wound/tooth culture and possible referral to [facility].

## 2014-10-26 ENCOUNTER — Other Ambulatory Visit: Payer: BLUE CROSS/BLUE SHIELD

## 2014-11-08 ENCOUNTER — Other Ambulatory Visit (INDEPENDENT_AMBULATORY_CARE_PROVIDER_SITE_OTHER): Payer: Medicare Other

## 2014-11-08 DIAGNOSIS — E7800 Pure hypercholesterolemia, unspecified: Secondary | ICD-10-CM

## 2014-11-08 LAB — BASIC METABOLIC PANEL
BUN: 17 mg/dL (ref 6–23)
CO2: 28 mEq/L (ref 19–32)
Calcium: 8.8 mg/dL (ref 8.4–10.5)
Chloride: 105 mEq/L (ref 96–112)
Creatinine, Ser: 1.07 mg/dL (ref 0.40–1.50)
GFR: 69.2 mL/min (ref >60.00)
Glucose, Bld: 91 mg/dL (ref 70–99)
Potassium: 3.6 mEq/L (ref 3.5–5.1)
Sodium: 140 mEq/L (ref 135–145)

## 2014-11-08 LAB — LIPID PANEL
Cholesterol: 102 mg/dL (ref 0–200)
HDL: 32.6 mg/dL — ABNORMAL LOW (ref >39.00)
LDL Cholesterol: 48 mg/dL (ref 0–99)
NonHDL: 69.72
Total CHOL/HDL Ratio: 3
Triglycerides: 107 mg/dL (ref 0.0–149.0)
VLDL: 21.4 mg/dL (ref 0.0–40.0)

## 2014-11-08 LAB — HEPATIC FUNCTION PANEL
ALT: 29 U/L (ref 0–53)
AST: 33 U/L (ref 0–37)
Albumin: 3.5 g/dL (ref 3.5–5.2)
Alkaline Phosphatase: 86 U/L (ref 39–117)
Bilirubin, Direct: 0.2 mg/dL (ref 0.0–0.3)
Total Bilirubin: 0.7 mg/dL (ref 0.2–1.2)
Total Protein: 6.3 g/dL (ref 6.0–8.3)

## 2014-11-08 NOTE — Progress Notes (Signed)
Quick Note:  Please report to patient. The recent labs are stable. Continue same medication and careful diet. ______ 

## 2014-11-09 ENCOUNTER — Other Ambulatory Visit: Payer: BLUE CROSS/BLUE SHIELD

## 2014-11-14 DIAGNOSIS — H43812 Vitreous degeneration, left eye: Secondary | ICD-10-CM | POA: Diagnosis not present

## 2014-11-14 DIAGNOSIS — H35412 Lattice degeneration of retina, left eye: Secondary | ICD-10-CM | POA: Diagnosis not present

## 2014-11-14 DIAGNOSIS — H3532 Exudative age-related macular degeneration: Secondary | ICD-10-CM | POA: Diagnosis not present

## 2014-11-20 DIAGNOSIS — H3532 Exudative age-related macular degeneration: Secondary | ICD-10-CM | POA: Diagnosis not present

## 2014-11-20 DIAGNOSIS — Z0100 Encounter for examination of eyes and vision without abnormal findings: Secondary | ICD-10-CM | POA: Diagnosis not present

## 2014-11-20 DIAGNOSIS — H35371 Puckering of macula, right eye: Secondary | ICD-10-CM | POA: Diagnosis not present

## 2014-11-20 DIAGNOSIS — H401431 Capsular glaucoma with pseudoexfoliation of lens, bilateral, mild stage: Secondary | ICD-10-CM | POA: Diagnosis not present

## 2014-12-04 ENCOUNTER — Encounter: Payer: Self-pay | Admitting: Cardiology

## 2014-12-04 ENCOUNTER — Ambulatory Visit (INDEPENDENT_AMBULATORY_CARE_PROVIDER_SITE_OTHER): Payer: Medicare Other | Admitting: Cardiology

## 2014-12-04 VITALS — BP 122/70 | HR 59 | Ht 65.0 in | Wt 167.8 lb

## 2014-12-04 DIAGNOSIS — Z955 Presence of coronary angioplasty implant and graft: Secondary | ICD-10-CM | POA: Diagnosis not present

## 2014-12-04 DIAGNOSIS — I259 Chronic ischemic heart disease, unspecified: Secondary | ICD-10-CM | POA: Diagnosis not present

## 2014-12-04 DIAGNOSIS — I359 Nonrheumatic aortic valve disorder, unspecified: Secondary | ICD-10-CM

## 2014-12-04 DIAGNOSIS — I119 Hypertensive heart disease without heart failure: Secondary | ICD-10-CM | POA: Diagnosis not present

## 2014-12-04 NOTE — Patient Instructions (Signed)
Medication Instructions:  Your physician recommends that you continue on your current medications as directed. Please refer to the Current Medication list given to you today.  Labwork: none  Testing/Procedures: none  Follow-Up: Your physician wants you to follow-up in: 6 months with fasting labs (lp/bmet/hfp)  You will receive a reminder letter in the mail two months in advance. If you don't receive a letter, please call our office to schedule the follow-up appointment.    

## 2014-12-04 NOTE — Progress Notes (Signed)
Cardiology Office Note   Date:  12/04/2014   ID:  [name], DOB Aug 24, 1923, MRN [id number]  PCP:  [name], MD  Cardiologist: Darlin Coco MD  No chief complaint on file.     History of Present Illness: [name] is a 81 y.o. male who presents for a six-month follow-up office visit.  He has a history of known ischemic heart disease.  Most recently presented to [facility] on 03/14/14 for planned coronary angiography after an abnormal outpatient nuclear stress test. Had PCI with DES x 2 to the mid RCA due to subtotal occlusion. Has normal EF. On DAPT for one year. Noted to have residual disease in the LAD which will be managed medically. Low dose beta blocker and Imdur added at discharge.  Since last visit he has had no new problems.  Goes to the health club 5 days a [place].  He uses machines.  His exertional dyspnea has improved with conditioning.  He is not experiencing any exertional chest pain or angina.  Since January 2016 his weight is down 9 pounds.  He spends a lot of his time [place] the beach at [facility]. He has a history of hairy cell leukemia which has been stable.  Past Medical History  Diagnosis Date  . 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 Compression fracture of lumbar spine, non-traumatic   . Anemia   . Malnutrition ([place])   . Hypertension   . GERD (gastroesophageal reflux disease)   . Elevated PSA   . Glaucoma   . Acute appendicitis   . Osteoporosis   . Dyslipidemia   . History of blood transfusion     "several; related to hairy cell leukemia & tx "  . History of stomach ulcers 1968  . DJD (degenerative joint disease) of lumbar spine   . Osteoarthritis   . Arthritis     "back" (03/14/2014)  . Anxiety   . Hairy cell leukemia ([place]) dx'd 1980  . Colon cancer ([place]) 1984  . Skin cancer of face   . CAD (coronary artery disease)     a. 03/14/14  s/p overlapping DES x2 to mid-distal RCA.    Past Surgical History    Procedure Laterality Date  . Splenectomy  1992  . Mole removal Left 1985    cheek  . Orif ankle fracture Left 2000  . Shoulder surgery  04/2004  . Cataract extraction, bilateral Bilateral 02/2008  . Mohs surgery Left 02/2011  . Lipoma excision Right 07/2011    liposarcoma resection; "back"  . Coronary angioplasty with stent placement  03/14/2014    "2"  . Appendectomy  10/2007  . Tonsillectomy and adenoidectomy  1939  . Fracture surgery    . Colon surgery  1984    "sigmoid; open"  . Mohs surgery  X 3    "all on my face"  . Left heart catheterization with coronary angiogram N/A 03/14/2014    Procedure: LEFT HEART CATHETERIZATION WITH CORONARY ANGIOGRAM;  Surgeon: Peter M Martinique, MD;  Location: [facility] CATH LAB;  Service: Cardiovascular;  Laterality: N/A;     Current Outpatient Prescriptions  Medication Sig Dispense Refill  . aspirin 81 MG tablet Take 81 mg by mouth daily.    Marland Kitchen atorvastatin (LIPITOR) 20 MG tablet Take 1 tablet (20 mg total) by mouth daily at 6 PM. 90 tablet 3  . Cholecalciferol (VITAMIN D-3 PO) Take 2,000 Units by mouth daily.     Marland Kitchen  clopidogrel (PLAVIX) 75 MG tablet Take 1 tablet (75 mg total) by mouth daily with breakfast. 90 tablet 3  . erythromycin ophthalmic ointment Place 1 application into the right eye daily.  98  . fluocinonide (LIDEX) 0.05 % external solution Apply 1 application topically daily as needed. As needed for dermatitis on scalp and ears    . isosorbide mononitrate (IMDUR) 30 MG 24 hr tablet Take 0.5 tablets (15 mg total) by mouth daily. 45 tablet 3  . LATANOPROST OP Apply 1 drop to eye at bedtime. In each eye    . metoprolol tartrate (LOPRESSOR) 25 MG tablet Take 12.5 mg by mouth daily.    Marland Kitchen NITROSTAT 0.4 MG SL tablet Place 0.4 mg under the tongue every 5 (five) minutes as needed for chest pain.   12  . pantoprazole (PROTONIX) 40 MG tablet Take 1 tablet (40 mg total) by mouth daily. 90 tablet 3  . sertraline (ZOLOFT) 50 MG tablet Take 50 mg by mouth  daily.    . tamsulosin (FLOMAX) 0.4 MG CAPS capsule Take 0.8 mg by mouth daily after supper.     . timolol (BETIMOL) 0.5 % ophthalmic solution Place 1 drop into the right eye daily.      No current facility-administered medications for this visit.    Allergies:   Antazoline; Antihistamines, chlorpheniramine-type; and Sulfa antibiotics    Social History:  The patient  reports that he has quit smoking. His smoking use included Cigarettes and Cigars. He has a .25 pack-year smoking history. He has never used smokeless tobacco. He reports that he drinks about 5.4 oz of alcohol per week. He reports that he does not use illicit drugs.   Family History:  The patient's family history includes Congestive Heart Failure (age of onset: 53) in his father; Diabetes Mellitus II in his brother; Heart Problems in his brother; Prostate cancer in his brother; Stroke in his mother.    ROS:  Please see the history of present illness.   Otherwise, review of systems are positive for none.   All other systems are reviewed and negative.    PHYSICAL EXAM: VS:  BP 122/70 mmHg  Pulse 59  Ht 5\' 5"  (1.651 m)  Wt 167 lb 12.8 oz (76.114 kg)  BMI 27.92 kg/m2 , BMI Body mass index is 27.92 kg/(m^2). GEN: Well nourished, well developed, in no acute distress HEENT: normal Neck: no JVD, carotid bruits, or masses Cardiac: RRR; there is a grade 2/6 systolic ejection murmur at the base consistent with his known mild aortic stenosis.  No, rubs, or gallops,no edema  Respiratory:  clear to auscultation bilaterally, normal work of breathing GI: soft, nontender, nondistended, + BS MS: no deformity or atrophy Skin: warm and dry, no rash Neuro:  Strength and sensation are intact Psych: euthymic mood, full affect   EKG:  EKG is ordered today. The ekg ordered today demonstrates normal sinus rhythm with bifascicular block unchanged from prior tracing   Recent Labs: 06/22/2014: Hemoglobin 12.7*; Platelets 230.0; TSH  2.16 11/08/2014: ALT 29; BUN 17; Creatinine, Ser 1.07; Potassium 3.6; Sodium 140    Lipid Panel    Component Value Date/Time   CHOL 102 11/08/2014 0902   TRIG 107.0 11/08/2014 0902   HDL 32.60* 11/08/2014 0902   CHOLHDL 3 11/08/2014 0902   VLDL 21.4 11/08/2014 0902   LDLCALC 48 11/08/2014 0902      Wt Readings from Last 3 Encounters:  12/04/14 167 lb 12.8 oz (76.114 kg)  06/22/14 169 lb (76.658  kg)  04/20/14 176 lb 2.4 oz (79.9 kg)         ASSESSMENT AND PLAN:  1. CAD - recent chest pain/abnormal myoview - now s/p cath with PCI to the mid RCA. Normal LV function. Residual LAD disease to be treated medically. He is doing very well clinically.He has finished cardiac rehabilitation and is now exercising on his own and he enjoys it.  He goes to the gym 5 days a week.  His exertional dyspnea has improved with conditioning aide he is not having any chest discomfort  2. HTN - able  3. HLD lipids were checked in September 2016 and are satisfactory  4. Malaise and fatigue. Improved with regular exercise.  5. History of hairy cell leukemia followed at [facility].  Stable   Current medicines are reviewed at length with the patient today.  The patient does not have concerns regarding medicines.  The following changes have been made:  no change  Labs/ tests ordered today include:   Orders Placed This Encounter  Procedures  . EKG 12-Lead     Disposition: Continue current medication.  Recheck in 6 months for follow-up office visit and fasting lab work.  Berna Spare MD 12/04/2014 8:59 AM    [facility] [place], [place], [place]  [address] Phone: [phone]; Fax: [phone]

## 2014-12-12 DIAGNOSIS — H353110 Nonexudative age-related macular degeneration, right eye, stage unspecified: Secondary | ICD-10-CM | POA: Diagnosis not present

## 2014-12-12 DIAGNOSIS — H35373 Puckering of macula, bilateral: Secondary | ICD-10-CM | POA: Diagnosis not present

## 2014-12-12 DIAGNOSIS — H353220 Exudative age-related macular degeneration, left eye, stage unspecified: Secondary | ICD-10-CM | POA: Diagnosis not present

## 2014-12-12 DIAGNOSIS — H3581 Retinal edema: Secondary | ICD-10-CM | POA: Diagnosis not present

## 2014-12-12 DIAGNOSIS — H43812 Vitreous degeneration, left eye: Secondary | ICD-10-CM | POA: Diagnosis not present

## 2014-12-15 DIAGNOSIS — I10 Essential (primary) hypertension: Secondary | ICD-10-CM | POA: Diagnosis not present

## 2014-12-15 DIAGNOSIS — M19042 Primary osteoarthritis, left hand: Secondary | ICD-10-CM | POA: Diagnosis not present

## 2014-12-15 DIAGNOSIS — S60222A Contusion of left hand, initial encounter: Secondary | ICD-10-CM | POA: Diagnosis not present

## 2014-12-15 DIAGNOSIS — S60311A Abrasion of right thumb, initial encounter: Secondary | ICD-10-CM | POA: Diagnosis not present

## 2014-12-15 DIAGNOSIS — Z23 Encounter for immunization: Secondary | ICD-10-CM | POA: Diagnosis not present

## 2014-12-25 DIAGNOSIS — Z85038 Personal history of other malignant neoplasm of large intestine: Secondary | ICD-10-CM | POA: Diagnosis not present

## 2014-12-25 DIAGNOSIS — Z79899 Other long term (current) drug therapy: Secondary | ICD-10-CM | POA: Diagnosis not present

## 2014-12-25 DIAGNOSIS — R911 Solitary pulmonary nodule: Secondary | ICD-10-CM | POA: Diagnosis not present

## 2014-12-25 DIAGNOSIS — M009 Pyogenic arthritis, unspecified: Secondary | ICD-10-CM | POA: Diagnosis not present

## 2014-12-25 DIAGNOSIS — R972 Elevated prostate specific antigen [PSA]: Secondary | ICD-10-CM | POA: Diagnosis not present

## 2014-12-25 DIAGNOSIS — I2510 Atherosclerotic heart disease of native coronary artery without angina pectoris: Secondary | ICD-10-CM | POA: Diagnosis not present

## 2014-12-25 DIAGNOSIS — Z23 Encounter for immunization: Secondary | ICD-10-CM | POA: Diagnosis not present

## 2014-12-25 DIAGNOSIS — F4321 Adjustment disorder with depressed mood: Secondary | ICD-10-CM | POA: Diagnosis not present

## 2014-12-25 DIAGNOSIS — N539 Unspecified male sexual dysfunction: Secondary | ICD-10-CM | POA: Diagnosis not present

## 2014-12-25 DIAGNOSIS — H539 Unspecified visual disturbance: Secondary | ICD-10-CM | POA: Diagnosis not present

## 2014-12-25 DIAGNOSIS — C9141 Hairy cell leukemia, in remission: Secondary | ICD-10-CM | POA: Diagnosis not present

## 2014-12-25 DIAGNOSIS — Z9049 Acquired absence of other specified parts of digestive tract: Secondary | ICD-10-CM | POA: Diagnosis not present

## 2014-12-25 DIAGNOSIS — J984 Other disorders of lung: Secondary | ICD-10-CM | POA: Diagnosis not present

## 2015-01-09 DIAGNOSIS — H43812 Vitreous degeneration, left eye: Secondary | ICD-10-CM | POA: Diagnosis not present

## 2015-01-09 DIAGNOSIS — H31113 Age-related choroidal atrophy, bilateral: Secondary | ICD-10-CM | POA: Diagnosis not present

## 2015-01-09 DIAGNOSIS — H353110 Nonexudative age-related macular degeneration, right eye, stage unspecified: Secondary | ICD-10-CM | POA: Diagnosis not present

## 2015-01-09 DIAGNOSIS — H35412 Lattice degeneration of retina, left eye: Secondary | ICD-10-CM | POA: Diagnosis not present

## 2015-01-09 DIAGNOSIS — H353220 Exudative age-related macular degeneration, left eye, stage unspecified: Secondary | ICD-10-CM | POA: Diagnosis not present

## 2015-01-09 DIAGNOSIS — H3530 Unspecified macular degeneration: Secondary | ICD-10-CM | POA: Diagnosis not present

## 2015-01-29 DIAGNOSIS — I10 Essential (primary) hypertension: Secondary | ICD-10-CM | POA: Diagnosis not present

## 2015-01-31 DIAGNOSIS — E784 Other hyperlipidemia: Secondary | ICD-10-CM | POA: Diagnosis not present

## 2015-01-31 DIAGNOSIS — F5104 Psychophysiologic insomnia: Secondary | ICD-10-CM | POA: Diagnosis not present

## 2015-01-31 DIAGNOSIS — C9140 Hairy cell leukemia not having achieved remission: Secondary | ICD-10-CM | POA: Diagnosis not present

## 2015-01-31 DIAGNOSIS — Z6828 Body mass index (BMI) 28.0-28.9, adult: Secondary | ICD-10-CM | POA: Diagnosis not present

## 2015-01-31 DIAGNOSIS — Z1389 Encounter for screening for other disorder: Secondary | ICD-10-CM | POA: Diagnosis not present

## 2015-01-31 DIAGNOSIS — I2510 Atherosclerotic heart disease of native coronary artery without angina pectoris: Secondary | ICD-10-CM | POA: Diagnosis not present

## 2015-01-31 DIAGNOSIS — R2681 Unsteadiness on feet: Secondary | ICD-10-CM | POA: Diagnosis not present

## 2015-01-31 DIAGNOSIS — Z0000 Encounter for general adult medical examination without abnormal findings: Secondary | ICD-10-CM | POA: Diagnosis not present

## 2015-01-31 DIAGNOSIS — I10 Essential (primary) hypertension: Secondary | ICD-10-CM | POA: Diagnosis not present

## 2015-01-31 DIAGNOSIS — F329 Major depressive disorder, single episode, unspecified: Secondary | ICD-10-CM | POA: Diagnosis not present

## 2015-01-31 DIAGNOSIS — R972 Elevated prostate specific antigen [PSA]: Secondary | ICD-10-CM | POA: Diagnosis not present

## 2015-01-31 DIAGNOSIS — C189 Malignant neoplasm of colon, unspecified: Secondary | ICD-10-CM | POA: Diagnosis not present

## 2015-02-06 DIAGNOSIS — H3589 Other specified retinal disorders: Secondary | ICD-10-CM | POA: Diagnosis not present

## 2015-02-06 DIAGNOSIS — C44311 Basal cell carcinoma of skin of nose: Secondary | ICD-10-CM | POA: Diagnosis not present

## 2015-02-06 DIAGNOSIS — H353230 Exudative age-related macular degeneration, bilateral, stage unspecified: Secondary | ICD-10-CM | POA: Diagnosis not present

## 2015-02-06 DIAGNOSIS — H35412 Lattice degeneration of retina, left eye: Secondary | ICD-10-CM | POA: Diagnosis not present

## 2015-02-06 DIAGNOSIS — H43813 Vitreous degeneration, bilateral: Secondary | ICD-10-CM | POA: Diagnosis not present

## 2015-03-06 DIAGNOSIS — H35371 Puckering of macula, right eye: Secondary | ICD-10-CM | POA: Diagnosis not present

## 2015-03-06 DIAGNOSIS — H353110 Nonexudative age-related macular degeneration, right eye, stage unspecified: Secondary | ICD-10-CM | POA: Diagnosis not present

## 2015-03-06 DIAGNOSIS — H43813 Vitreous degeneration, bilateral: Secondary | ICD-10-CM | POA: Diagnosis not present

## 2015-03-06 DIAGNOSIS — H353220 Exudative age-related macular degeneration, left eye, stage unspecified: Secondary | ICD-10-CM | POA: Diagnosis not present

## 2015-03-09 DIAGNOSIS — N402 Nodular prostate without lower urinary tract symptoms: Secondary | ICD-10-CM | POA: Insufficient documentation

## 2015-03-09 DIAGNOSIS — N400 Enlarged prostate without lower urinary tract symptoms: Secondary | ICD-10-CM | POA: Diagnosis not present

## 2015-03-09 DIAGNOSIS — R972 Elevated prostate specific antigen [PSA]: Secondary | ICD-10-CM | POA: Diagnosis not present

## 2015-04-03 DIAGNOSIS — Z85828 Personal history of other malignant neoplasm of skin: Secondary | ICD-10-CM | POA: Diagnosis not present

## 2015-04-03 DIAGNOSIS — H35412 Lattice degeneration of retina, left eye: Secondary | ICD-10-CM | POA: Diagnosis not present

## 2015-04-03 DIAGNOSIS — L820 Inflamed seborrheic keratosis: Secondary | ICD-10-CM | POA: Diagnosis not present

## 2015-04-03 DIAGNOSIS — L570 Actinic keratosis: Secondary | ICD-10-CM | POA: Diagnosis not present

## 2015-04-03 DIAGNOSIS — H43821 Vitreomacular adhesion, right eye: Secondary | ICD-10-CM | POA: Diagnosis not present

## 2015-04-03 DIAGNOSIS — L853 Xerosis cutis: Secondary | ICD-10-CM | POA: Diagnosis not present

## 2015-04-03 DIAGNOSIS — H35371 Puckering of macula, right eye: Secondary | ICD-10-CM | POA: Diagnosis not present

## 2015-04-03 DIAGNOSIS — L218 Other seborrheic dermatitis: Secondary | ICD-10-CM | POA: Diagnosis not present

## 2015-04-03 DIAGNOSIS — H353110 Nonexudative age-related macular degeneration, right eye, stage unspecified: Secondary | ICD-10-CM | POA: Diagnosis not present

## 2015-04-03 DIAGNOSIS — H353221 Exudative age-related macular degeneration, left eye, with active choroidal neovascularization: Secondary | ICD-10-CM | POA: Diagnosis not present

## 2015-04-25 ENCOUNTER — Telehealth: Payer: Self-pay | Admitting: Cardiology

## 2015-04-25 NOTE — Telephone Encounter (Signed)
Please call,would not give any details about what he wanted.

## 2015-04-25 NOTE — Telephone Encounter (Signed)
Patient wants to know who  Dr. Mare Ferrari recommends him seeing after he retires Will forward to  Dr. Mare Ferrari for review

## 2015-04-25 NOTE — Telephone Encounter (Signed)
Dr. Stanford Breed would be good.

## 2015-04-26 NOTE — Telephone Encounter (Signed)
Advised patient

## 2015-05-01 DIAGNOSIS — H353220 Exudative age-related macular degeneration, left eye, stage unspecified: Secondary | ICD-10-CM | POA: Diagnosis not present

## 2015-05-01 DIAGNOSIS — H43813 Vitreous degeneration, bilateral: Secondary | ICD-10-CM | POA: Diagnosis not present

## 2015-05-01 DIAGNOSIS — H35371 Puckering of macula, right eye: Secondary | ICD-10-CM | POA: Diagnosis not present

## 2015-05-01 DIAGNOSIS — H353110 Nonexudative age-related macular degeneration, right eye, stage unspecified: Secondary | ICD-10-CM | POA: Diagnosis not present

## 2015-05-01 DIAGNOSIS — H3341 Traction detachment of retina, right eye: Secondary | ICD-10-CM | POA: Diagnosis not present

## 2015-05-07 ENCOUNTER — Other Ambulatory Visit: Payer: Self-pay | Admitting: *Deleted

## 2015-05-07 ENCOUNTER — Telehealth: Payer: Self-pay | Admitting: Nurse Practitioner

## 2015-05-07 MED ORDER — ATORVASTATIN CALCIUM 20 MG PO TABS: 20.0000 mg | ORAL_TABLET | Freq: Every day | ORAL | Status: DC

## 2015-05-07 MED ORDER — PANTOPRAZOLE SODIUM 40 MG PO TBEC: 40.0000 mg | DELAYED_RELEASE_TABLET | Freq: Every day | ORAL | Status: DC

## 2015-05-07 MED ORDER — ISOSORBIDE MONONITRATE ER 30 MG PO TB24: 15.0000 mg | ORAL_TABLET | Freq: Every day | ORAL | Status: DC

## 2015-05-07 MED ORDER — CLOPIDOGREL BISULFATE 75 MG PO TABS: 75.0000 mg | ORAL_TABLET | Freq: Every day | ORAL | Status: DC

## 2015-05-07 NOTE — Telephone Encounter (Signed)
All requested rxs have been sent in except for the metoprolol. Please advise on what current metoprolol dose should be looks like it was put in as 12.5mg  qd, but as a historical med. Thanks, MI

## 2015-05-07 NOTE — Telephone Encounter (Signed)
All  of his medicine need to be changed to his new pharmacy. It is now [facility].Everything needs to be faxed to-[phone].                                                        *STAT* If patient is at the pharmacy, call can be transferred to refill team.   1. Which medications need to be refilled? (please list name of each medication and dose if known) Clopidogrel  75 mg,Isosorbide 30 mg,Metoprolol 25 mg,Panpotrazole 40mg  and Atorvastatin 20 mg  2. Which pharmacy/location (including street and city if local pharmacy) is medication to be sent to?[facility]-Fax:[phone]  3. Do they need a 30 day or 90 day supply? 90 and refills

## 2015-05-08 MED ORDER — METOPROLOL TARTRATE 25 MG PO TABS: 12.5000 mg | ORAL_TABLET | Freq: Every day | ORAL | Status: DC

## 2015-05-12 ENCOUNTER — Telehealth: Payer: Self-pay | Admitting: Internal Medicine

## 2015-05-12 MED ORDER — CLOPIDOGREL BISULFATE 75 MG PO TABS: 75.0000 mg | ORAL_TABLET | Freq: Every day | ORAL | Status: DC

## 2015-05-12 NOTE — Telephone Encounter (Signed)
Patient's wife was contacted since he was out of plavix. Clopidogrel prescription for 90 tablets was sent to the [facility].

## 2015-05-14 ENCOUNTER — Other Ambulatory Visit: Payer: Self-pay

## 2015-05-14 MED ORDER — CLOPIDOGREL BISULFATE 75 MG PO TABS: 75.0000 mg | ORAL_TABLET | Freq: Every day | ORAL | Status: DC

## 2015-05-17 ENCOUNTER — Other Ambulatory Visit: Payer: Self-pay | Admitting: *Deleted

## 2015-05-18 ENCOUNTER — Other Ambulatory Visit: Payer: Self-pay

## 2015-05-18 MED ORDER — ISOSORBIDE MONONITRATE ER 30 MG PO TB24: 15.0000 mg | ORAL_TABLET | Freq: Every day | ORAL | Status: DC

## 2015-05-29 DIAGNOSIS — H353220 Exudative age-related macular degeneration, left eye, stage unspecified: Secondary | ICD-10-CM | POA: Diagnosis not present

## 2015-05-29 DIAGNOSIS — H43819 Vitreous degeneration, unspecified eye: Secondary | ICD-10-CM | POA: Diagnosis not present

## 2015-05-29 DIAGNOSIS — H3341 Traction detachment of retina, right eye: Secondary | ICD-10-CM | POA: Diagnosis not present

## 2015-05-29 DIAGNOSIS — H35372 Puckering of macula, left eye: Secondary | ICD-10-CM | POA: Diagnosis not present

## 2015-06-05 ENCOUNTER — Encounter: Payer: Self-pay | Admitting: Nurse Practitioner

## 2015-06-05 ENCOUNTER — Ambulatory Visit (INDEPENDENT_AMBULATORY_CARE_PROVIDER_SITE_OTHER): Payer: Medicare Other | Admitting: Nurse Practitioner

## 2015-06-05 ENCOUNTER — Other Ambulatory Visit: Payer: Medicare Other

## 2015-06-05 VITALS — BP 148/82 | HR 60 | Ht 64.5 in | Wt 170.8 lb

## 2015-06-05 DIAGNOSIS — I259 Chronic ischemic heart disease, unspecified: Secondary | ICD-10-CM

## 2015-06-05 DIAGNOSIS — I119 Hypertensive heart disease without heart failure: Secondary | ICD-10-CM

## 2015-06-05 DIAGNOSIS — I350 Nonrheumatic aortic (valve) stenosis: Secondary | ICD-10-CM

## 2015-06-05 DIAGNOSIS — E7800 Pure hypercholesterolemia, unspecified: Secondary | ICD-10-CM | POA: Diagnosis not present

## 2015-06-05 LAB — LIPID PANEL
Cholesterol: 123 mg/dL — ABNORMAL LOW (ref 125–200)
HDL: 35 mg/dL — ABNORMAL LOW (ref >=40)
LDL Cholesterol: 62 mg/dL (ref <130)
Total CHOL/HDL Ratio: 3.5 Ratio (ref <=5.0)
Triglycerides: 130 mg/dL (ref <150)
VLDL: 26 mg/dL (ref <30)

## 2015-06-05 LAB — BASIC METABOLIC PANEL
BUN: 21 mg/dL (ref 7–25)
CO2: 24 mmol/L (ref 20–31)
Calcium: 8.7 mg/dL (ref 8.6–10.3)
Chloride: 105 mmol/L (ref 98–110)
Creat: 0.94 mg/dL (ref 0.70–1.11)
Glucose, Bld: 96 mg/dL (ref 65–99)
Potassium: 4.1 mmol/L (ref 3.5–5.3)
Sodium: 142 mmol/L (ref 135–146)

## 2015-06-05 LAB — HEPATIC FUNCTION PANEL
ALT: 18 U/L (ref 9–46)
AST: 24 U/L (ref 10–35)
Albumin: 3.7 g/dL (ref 3.6–5.1)
Alkaline Phosphatase: 97 U/L (ref 40–115)
Bilirubin, Direct: 0.2 mg/dL (ref <=0.2)
Indirect Bilirubin: 0.6 mg/dL (ref 0.2–1.2)
Total Bilirubin: 0.8 mg/dL (ref 0.2–1.2)
Total Protein: 6.4 g/dL (ref 6.1–8.1)

## 2015-06-05 NOTE — Patient Instructions (Addendum)
We will be checking the following labs today - BMET, HPF and lipids   Medication Instructions:    Continue with your current medicines.     Testing/Procedures To Be Arranged:  Echocardiogram  Follow-Up:   See me in 6 months    Other Special Instructions:   Keep up the good work!!    If you need a refill on your cardiac medications before your next appointment, please call your pharmacy.   Call the [place] office at [phone] if you have any questions, problems or concerns.

## 2015-06-05 NOTE — Progress Notes (Signed)
CARDIOLOGY OFFICE NOTE  Date:  06/05/2015    [name] Date of Birth: 08-10-1923 Medical Record [id number]  PCP:  [name], MD  Cardiologist:  Former patient of Dr. Sherryl Barters.     Chief Complaint  Patient presents with  . Coronary Artery Disease  . Hyperlipidemia  . Hypertension    6 month check - former patient of Dr. Sherryl Barters    History of Present Illness: [name] is a 82 y.o. male who presents today for a 6 month check - former patient of Dr. Sherryl Barters.   He has a history of known CAD. Back in January of 2016 he presented for planned coronary angiography after an abnormal outpatient nuclear stress test. Had PCI with DES x 2 to the mid RCA due to subtotal occlusion. Has normal EF. On DAPT for one year. Noted to have residual disease in the LAD which will be managed medically. Low dose beta blocker and Imdur added at discharge. Other issues include a history of hairy cell leukemia, HTN, HLD and PUD.   Last seen back in February - was doing well.  Comes back today. Here alone. He feels like he is doing well. No chest pain. Does have some shortness of breath - especially with carrying items in his arms - feels like this has improved since last visit. No syncope. Has not had his medicines today. Continues to exercise regularly. He is happy with how he is doing.   Past Medical History  Diagnosis Date  . 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 Compression fracture of lumbar spine, non-traumatic   . Anemia   . Malnutrition ([place])   . Hypertension   . GERD (gastroesophageal reflux disease)   . Elevated PSA   . Glaucoma   . Acute appendicitis   . Osteoporosis   . Dyslipidemia   . History of blood transfusion     "several; related to hairy cell leukemia & tx "  . History of stomach ulcers 1968  . DJD (degenerative joint disease) of lumbar spine   . Osteoarthritis   . Arthritis     "back" (03/14/2014)  . Anxiety   . Hairy cell  leukemia ([place]) dx'd 1980  . Colon cancer ([place]) 1984  . Skin cancer of face   . CAD (coronary artery disease)     a. 03/14/14  s/p overlapping DES x2 to mid-distal RCA.    Past Surgical History  Procedure Laterality Date  . Splenectomy  1992  . Mole removal Left 1985    cheek  . Orif ankle fracture Left 2000  . Shoulder surgery  04/2004  . Cataract extraction, bilateral Bilateral 02/2008  . Mohs surgery Left 02/2011  . Lipoma excision Right 07/2011    liposarcoma resection; "back"  . Coronary angioplasty with stent placement  03/14/2014    "2"  . Appendectomy  10/2007  . Tonsillectomy and adenoidectomy  1939  . Fracture surgery    . Colon surgery  1984    "sigmoid; open"  . Mohs surgery  X 3    "all on my face"  . Left heart catheterization with coronary angiogram N/A 03/14/2014    Procedure: LEFT HEART CATHETERIZATION WITH CORONARY ANGIOGRAM;  Surgeon: Peter M Martinique, MD;  Location: [facility] CATH LAB;  Service: Cardiovascular;  Laterality: N/A;     Medications: Current Outpatient Prescriptions  Medication Sig Dispense Refill  . aspirin 81 MG tablet Take 81 mg by mouth daily.    Marland Kitchen  atorvastatin (LIPITOR) 20 MG tablet Take 1 tablet (20 mg total) by mouth daily at 6 PM. 90 tablet 0  . Cholecalciferol (VITAMIN D-3 PO) Take 2,000 Units by mouth daily.     . clopidogrel (PLAVIX) 75 MG tablet Take 1 tablet (75 mg total) by mouth daily with breakfast. 90 tablet 1  . erythromycin ophthalmic ointment Place 1 application into the right eye daily.  98  . fluocinonide (LIDEX) 0.05 % external solution APP TOPICALLY TO SCALP AND EARS BID FOR DERMATITIS  11  . isosorbide mononitrate (IMDUR) 30 MG 24 hr tablet Take 0.5 tablets (15 mg total) by mouth daily. 45 tablet 0  . LATANOPROST OP Apply 1 drop to eye at bedtime. In each eye    . metoprolol tartrate (LOPRESSOR) 25 MG tablet Take 0.5 tablets (12.5 mg total) by mouth daily. 45 tablet 0  . NITROSTAT 0.4 MG SL tablet Place 0.4 mg under the tongue every  5 (five) minutes as needed for chest pain.   12  . pantoprazole (PROTONIX) 40 MG tablet Take 1 tablet (40 mg total) by mouth daily. 90 tablet 0  . sertraline (ZOLOFT) 50 MG tablet Take 50 mg by mouth daily.    . tamsulosin (FLOMAX) 0.4 MG CAPS capsule Take 0.8 mg by mouth daily after supper.     . timolol (BETIMOL) 0.5 % ophthalmic solution Place 1 drop into the right eye daily.      No current facility-administered medications for this visit.    Allergies: Allergies  Allergen Reactions  . Antazoline Anaphylaxis  . Antihistamines, Chlorpheniramine-Type     Nervous, jittery  . Sulfa Antibiotics Rash    Social History: The patient  reports that he has quit smoking. His smoking use included Cigarettes and Cigars. He has a .25 pack-year smoking history. He has never used smokeless tobacco. He reports that he drinks about 5.4 oz of alcohol per week. He reports that he does not use illicit drugs.   Family History: The patient's family history includes Congestive Heart Failure (age of onset: 78) in his father; Diabetes Mellitus II in his brother; Heart Problems in his brother; Prostate cancer in his brother; Stroke in his mother.   Review of Systems: Please see the history of present illness.   Otherwise, the review of systems is positive for none.   All other systems are reviewed and negative.   Physical Exam: VS:  BP 148/82 mmHg  Pulse 60  Ht 5' 4.5" (1.638 m)  Wt 170 lb 12.8 oz (77.474 kg)  BMI 28.88 kg/m2 .  BMI Body mass index is 28.88 kg/(m^2).  Wt Readings from Last 3 Encounters:  06/05/15 170 lb 12.8 oz (77.474 kg)  12/04/14 167 lb 12.8 oz (76.114 kg)  06/22/14 169 lb (76.658 kg)    General: Pleasant. Elderly male who is alert and in no acute distress.  HEENT: Normal. Neck: Supple, no JVD, carotid bruits, or masses noted.  Cardiac: Regular rate and rhythm. Harsh outflow murmur noted. No edema.  Respiratory:  Lungs are clear to auscultation bilaterally with normal work of  breathing.  GI: Soft and nontender.  MS: No deformity or atrophy. Gait and ROM intact. Skin: Warm and dry. Color is normal.  Neuro:  Strength and sensation are intact and no gross focal deficits noted.  Psych: Alert, appropriate and with normal affect.   LABORATORY DATA:  EKG:  EKG is not ordered today.  Lab Results  Component Value Date   WBC 6.2 06/22/2014  HGB 12.7* 06/22/2014   HCT 38.2* 06/22/2014   PLT 230.0 06/22/2014   GLUCOSE 91 11/08/2014   CHOL 102 11/08/2014   TRIG 107.0 11/08/2014   HDL 32.60* 11/08/2014   LDLCALC 48 11/08/2014   ALT 29 11/08/2014   AST 33 11/08/2014   NA 140 11/08/2014   K 3.6 11/08/2014   CL 105 11/08/2014   CREATININE 1.07 11/08/2014   BUN 17 11/08/2014   CO2 28 11/08/2014   TSH 2.16 06/22/2014   INR 1.0 03/10/2014    BNP (last 3 results) No results for input(s): BNP in the last 8760 hours.  ProBNP (last 3 results) No results for input(s): PROBNP in the last 8760 hours.   Other Studies Reviewed Today:  Echo Study Conclusions from 2014  - Left ventricle: The cavity size was normal. There was mild concentric hypertrophy. Systolic function was normal. The estimated ejection fraction was in the range of 60% to 65%. Wall motion was normal; there were no regional wall motion abnormalities. Doppler parameters are consistent with abnormal left ventricular relaxation (grade 1 diastolic dysfunction). Doppler parameters are consistent with high ventricular filling pressure. - Aortic valve: Normal-sized, calcified annulus. Trileaflet; severely thickened leaflets. Cusp separation was reduced. There was mild stenosis. - Atrial septum: No defect or patent foramen ovale was identified.   Assessment/Plan: 1. CAD - with prior cath from 03/2014 with PCI to the mid RCA. Normal LV function. Residual LAD disease to be treated medically. He continues to do well clinically.   2. HTN - no medicines yet today.   3. HLD -  remains on statin therapy - recheck labs today.  4. Aortic stenosis - would update his echo - he is quite active - would be a candidate for TAVR if needed.   5. History of hairy cell leukemia followed at [facility]. Stable  Current medicines are reviewed with the patient today.  The patient does not have concerns regarding medicines other than what has been noted above.  The following changes have been made:  See above.  Labs/ tests ordered today include:    Orders Placed This Encounter  Procedures  . Basic metabolic panel  . Hepatic function panel  . Lipid panel  . ECHOCARDIOGRAM COMPLETE     Disposition:   FU with me in 6 months.  Patient is agreeable to this plan and will call if any problems develop in the interim.   Signed: Burtis Junes, RN, ANP-C 06/05/2015 8:40 AM  [facility] [address] [place], [place]  [address] Phone: [phone] Fax: [phone]

## 2015-06-07 DIAGNOSIS — R911 Solitary pulmonary nodule: Secondary | ICD-10-CM | POA: Diagnosis not present

## 2015-06-07 DIAGNOSIS — C9140 Hairy cell leukemia not having achieved remission: Secondary | ICD-10-CM | POA: Diagnosis not present

## 2015-06-07 DIAGNOSIS — C499 Malignant neoplasm of connective and soft tissue, unspecified: Secondary | ICD-10-CM | POA: Diagnosis not present

## 2015-06-07 DIAGNOSIS — C189 Malignant neoplasm of colon, unspecified: Secondary | ICD-10-CM | POA: Diagnosis not present

## 2015-06-21 ENCOUNTER — Ambulatory Visit (HOSPITAL_COMMUNITY): Payer: Medicare Other | Attending: Cardiology

## 2015-06-21 ENCOUNTER — Other Ambulatory Visit: Payer: Self-pay

## 2015-06-21 DIAGNOSIS — I77810 Thoracic aortic ectasia: Secondary | ICD-10-CM | POA: Diagnosis not present

## 2015-06-21 DIAGNOSIS — E7800 Pure hypercholesterolemia, unspecified: Secondary | ICD-10-CM

## 2015-06-21 DIAGNOSIS — I119 Hypertensive heart disease without heart failure: Secondary | ICD-10-CM | POA: Diagnosis not present

## 2015-06-21 DIAGNOSIS — I350 Nonrheumatic aortic (valve) stenosis: Secondary | ICD-10-CM | POA: Diagnosis not present

## 2015-06-21 DIAGNOSIS — Z8249 Family history of ischemic heart disease and other diseases of the circulatory system: Secondary | ICD-10-CM | POA: Insufficient documentation

## 2015-06-21 DIAGNOSIS — I259 Chronic ischemic heart disease, unspecified: Secondary | ICD-10-CM

## 2015-06-21 DIAGNOSIS — I352 Nonrheumatic aortic (valve) stenosis with insufficiency: Secondary | ICD-10-CM | POA: Insufficient documentation

## 2015-06-21 DIAGNOSIS — E785 Hyperlipidemia, unspecified: Secondary | ICD-10-CM | POA: Diagnosis not present

## 2015-06-21 DIAGNOSIS — I2510 Atherosclerotic heart disease of native coronary artery without angina pectoris: Secondary | ICD-10-CM | POA: Diagnosis not present

## 2015-06-21 DIAGNOSIS — Z87891 Personal history of nicotine dependence: Secondary | ICD-10-CM | POA: Diagnosis not present

## 2015-06-25 DIAGNOSIS — C9140 Hairy cell leukemia not having achieved remission: Secondary | ICD-10-CM | POA: Diagnosis not present

## 2015-06-25 DIAGNOSIS — C9141 Hairy cell leukemia, in remission: Secondary | ICD-10-CM | POA: Diagnosis not present

## 2015-06-26 DIAGNOSIS — H35371 Puckering of macula, right eye: Secondary | ICD-10-CM | POA: Diagnosis not present

## 2015-06-26 DIAGNOSIS — H3341 Traction detachment of retina, right eye: Secondary | ICD-10-CM | POA: Diagnosis not present

## 2015-06-26 DIAGNOSIS — H353110 Nonexudative age-related macular degeneration, right eye, stage unspecified: Secondary | ICD-10-CM | POA: Diagnosis not present

## 2015-06-26 DIAGNOSIS — H353220 Exudative age-related macular degeneration, left eye, stage unspecified: Secondary | ICD-10-CM | POA: Diagnosis not present

## 2015-07-24 ENCOUNTER — Other Ambulatory Visit: Payer: Self-pay | Admitting: Cardiology

## 2015-07-24 ENCOUNTER — Other Ambulatory Visit: Payer: Self-pay

## 2015-07-24 DIAGNOSIS — H35412 Lattice degeneration of retina, left eye: Secondary | ICD-10-CM | POA: Diagnosis not present

## 2015-07-24 DIAGNOSIS — H353110 Nonexudative age-related macular degeneration, right eye, stage unspecified: Secondary | ICD-10-CM | POA: Diagnosis not present

## 2015-07-24 DIAGNOSIS — H353220 Exudative age-related macular degeneration, left eye, stage unspecified: Secondary | ICD-10-CM | POA: Diagnosis not present

## 2015-07-24 DIAGNOSIS — H3341 Traction detachment of retina, right eye: Secondary | ICD-10-CM | POA: Diagnosis not present

## 2015-07-24 DIAGNOSIS — H43812 Vitreous degeneration, left eye: Secondary | ICD-10-CM | POA: Diagnosis not present

## 2015-07-24 MED ORDER — METOPROLOL TARTRATE 25 MG PO TABS: 12.5000 mg | ORAL_TABLET | Freq: Every day | ORAL | Status: DC

## 2015-07-25 ENCOUNTER — Other Ambulatory Visit: Payer: Self-pay

## 2015-07-25 MED ORDER — PANTOPRAZOLE SODIUM 40 MG PO TBEC: 40.0000 mg | DELAYED_RELEASE_TABLET | Freq: Every day | ORAL | Status: DC

## 2015-07-25 MED ORDER — CLOPIDOGREL BISULFATE 75 MG PO TABS: 75.0000 mg | ORAL_TABLET | Freq: Every day | ORAL | Status: DC

## 2015-07-25 MED ORDER — ATORVASTATIN CALCIUM 20 MG PO TABS: 20.0000 mg | ORAL_TABLET | Freq: Every day | ORAL | Status: DC

## 2015-07-25 MED ORDER — ISOSORBIDE MONONITRATE ER 30 MG PO TB24: 15.0000 mg | ORAL_TABLET | Freq: Every day | ORAL | Status: DC

## 2015-07-25 MED ORDER — METOPROLOL TARTRATE 25 MG PO TABS: 12.5000 mg | ORAL_TABLET | Freq: Every day | ORAL | Status: DC

## 2015-08-21 DIAGNOSIS — H43813 Vitreous degeneration, bilateral: Secondary | ICD-10-CM | POA: Diagnosis not present

## 2015-08-21 DIAGNOSIS — H35412 Lattice degeneration of retina, left eye: Secondary | ICD-10-CM | POA: Diagnosis not present

## 2015-08-21 DIAGNOSIS — H353220 Exudative age-related macular degeneration, left eye, stage unspecified: Secondary | ICD-10-CM | POA: Diagnosis not present

## 2015-09-25 DIAGNOSIS — H43813 Vitreous degeneration, bilateral: Secondary | ICD-10-CM | POA: Diagnosis not present

## 2015-09-25 DIAGNOSIS — H353220 Exudative age-related macular degeneration, left eye, stage unspecified: Secondary | ICD-10-CM | POA: Diagnosis not present

## 2015-09-25 DIAGNOSIS — H35412 Lattice degeneration of retina, left eye: Secondary | ICD-10-CM | POA: Diagnosis not present

## 2015-09-25 DIAGNOSIS — H353110 Nonexudative age-related macular degeneration, right eye, stage unspecified: Secondary | ICD-10-CM | POA: Diagnosis not present

## 2015-10-02 ENCOUNTER — Other Ambulatory Visit: Payer: Self-pay | Admitting: Nurse Practitioner

## 2015-10-02 MED ORDER — ISOSORBIDE MONONITRATE ER 30 MG PO TB24: 15.0000 mg | ORAL_TABLET | Freq: Every day | ORAL | 2 refills | Status: DC

## 2015-10-23 DIAGNOSIS — H35373 Puckering of macula, bilateral: Secondary | ICD-10-CM | POA: Diagnosis not present

## 2015-10-23 DIAGNOSIS — H43813 Vitreous degeneration, bilateral: Secondary | ICD-10-CM | POA: Diagnosis not present

## 2015-10-23 DIAGNOSIS — H353110 Nonexudative age-related macular degeneration, right eye, stage unspecified: Secondary | ICD-10-CM | POA: Diagnosis not present

## 2015-10-23 DIAGNOSIS — H353220 Exudative age-related macular degeneration, left eye, stage unspecified: Secondary | ICD-10-CM | POA: Diagnosis not present

## 2015-10-23 DIAGNOSIS — H35412 Lattice degeneration of retina, left eye: Secondary | ICD-10-CM | POA: Diagnosis not present

## 2015-10-23 DIAGNOSIS — H3341 Traction detachment of retina, right eye: Secondary | ICD-10-CM | POA: Diagnosis not present

## 2015-11-12 DIAGNOSIS — R05 Cough: Secondary | ICD-10-CM | POA: Diagnosis not present

## 2015-11-12 DIAGNOSIS — H9193 Unspecified hearing loss, bilateral: Secondary | ICD-10-CM | POA: Diagnosis not present

## 2015-11-12 DIAGNOSIS — H60502 Unspecified acute noninfective otitis externa, left ear: Secondary | ICD-10-CM | POA: Diagnosis not present

## 2015-11-12 DIAGNOSIS — J019 Acute sinusitis, unspecified: Secondary | ICD-10-CM | POA: Diagnosis not present

## 2015-11-12 DIAGNOSIS — Z6828 Body mass index (BMI) 28.0-28.9, adult: Secondary | ICD-10-CM | POA: Diagnosis not present

## 2015-11-12 DIAGNOSIS — H9212 Otorrhea, left ear: Secondary | ICD-10-CM | POA: Diagnosis not present

## 2015-11-14 DIAGNOSIS — H6060 Unspecified chronic otitis externa, unspecified ear: Secondary | ICD-10-CM | POA: Diagnosis not present

## 2015-11-14 DIAGNOSIS — H903 Sensorineural hearing loss, bilateral: Secondary | ICD-10-CM | POA: Diagnosis not present

## 2015-11-14 DIAGNOSIS — H6123 Impacted cerumen, bilateral: Secondary | ICD-10-CM | POA: Diagnosis not present

## 2015-11-21 DIAGNOSIS — H353110 Nonexudative age-related macular degeneration, right eye, stage unspecified: Secondary | ICD-10-CM | POA: Diagnosis not present

## 2015-11-21 DIAGNOSIS — H43813 Vitreous degeneration, bilateral: Secondary | ICD-10-CM | POA: Diagnosis not present

## 2015-11-21 DIAGNOSIS — H353220 Exudative age-related macular degeneration, left eye, stage unspecified: Secondary | ICD-10-CM | POA: Diagnosis not present

## 2015-11-21 DIAGNOSIS — H43821 Vitreomacular adhesion, right eye: Secondary | ICD-10-CM | POA: Diagnosis not present

## 2015-11-21 DIAGNOSIS — H35412 Lattice degeneration of retina, left eye: Secondary | ICD-10-CM | POA: Diagnosis not present

## 2015-11-26 DIAGNOSIS — Z961 Presence of intraocular lens: Secondary | ICD-10-CM | POA: Diagnosis not present

## 2015-11-26 DIAGNOSIS — H52203 Unspecified astigmatism, bilateral: Secondary | ICD-10-CM | POA: Diagnosis not present

## 2015-11-26 DIAGNOSIS — H35373 Puckering of macula, bilateral: Secondary | ICD-10-CM | POA: Diagnosis not present

## 2015-11-26 DIAGNOSIS — H401431 Capsular glaucoma with pseudoexfoliation of lens, bilateral, mild stage: Secondary | ICD-10-CM | POA: Diagnosis not present

## 2015-12-10 ENCOUNTER — Encounter: Payer: Self-pay | Admitting: Nurse Practitioner

## 2015-12-10 ENCOUNTER — Ambulatory Visit (INDEPENDENT_AMBULATORY_CARE_PROVIDER_SITE_OTHER): Payer: Medicare Other | Admitting: Nurse Practitioner

## 2015-12-10 VITALS — BP 118/64 | HR 64 | Ht 64.0 in | Wt 167.8 lb

## 2015-12-10 DIAGNOSIS — I119 Hypertensive heart disease without heart failure: Secondary | ICD-10-CM

## 2015-12-10 DIAGNOSIS — E7800 Pure hypercholesterolemia, unspecified: Secondary | ICD-10-CM

## 2015-12-10 DIAGNOSIS — I350 Nonrheumatic aortic (valve) stenosis: Secondary | ICD-10-CM | POA: Diagnosis not present

## 2015-12-10 DIAGNOSIS — I259 Chronic ischemic heart disease, unspecified: Secondary | ICD-10-CM

## 2015-12-10 LAB — CBC
HCT: 40.6 % (ref 38.5–50.0)
Hemoglobin: 13.3 g/dL (ref 13.2–17.1)
MCH: 29.2 pg (ref 27.0–33.0)
MCHC: 32.8 g/dL (ref 32.0–36.0)
MCV: 89 fL (ref 80.0–100.0)
MPV: 10.9 fL (ref 7.5–12.5)
Platelets: 152 10*3/uL (ref 140–400)
RBC: 4.56 MIL/uL (ref 4.20–5.80)
RDW: 14.9 % (ref 11.0–15.0)
WBC: 7 10*3/uL (ref 3.8–10.8)

## 2015-12-10 LAB — LIPID PANEL
Cholesterol: 122 mg/dL — ABNORMAL LOW (ref 125–200)
HDL: 33 mg/dL — ABNORMAL LOW (ref >=40)
LDL Cholesterol: 58 mg/dL (ref <130)
Total CHOL/HDL Ratio: 3.7 Ratio (ref <=5.0)
Triglycerides: 154 mg/dL — ABNORMAL HIGH (ref <150)
VLDL: 31 mg/dL — ABNORMAL HIGH (ref <30)

## 2015-12-10 LAB — BASIC METABOLIC PANEL
BUN: 23 mg/dL (ref 7–25)
CO2: 25 mmol/L (ref 20–31)
Calcium: 8.9 mg/dL (ref 8.6–10.3)
Chloride: 106 mmol/L (ref 98–110)
Creat: 1.06 mg/dL (ref 0.70–1.11)
Glucose, Bld: 85 mg/dL (ref 65–99)
Potassium: 3.9 mmol/L (ref 3.5–5.3)
Sodium: 141 mmol/L (ref 135–146)

## 2015-12-10 LAB — HEPATIC FUNCTION PANEL
ALT: 19 U/L (ref 9–46)
AST: 22 U/L (ref 10–35)
Albumin: 3.6 g/dL (ref 3.6–5.1)
Alkaline Phosphatase: 88 U/L (ref 40–115)
Bilirubin, Direct: 0.1 mg/dL (ref <=0.2)
Indirect Bilirubin: 0.4 mg/dL (ref 0.2–1.2)
Total Bilirubin: 0.5 mg/dL (ref 0.2–1.2)
Total Protein: 6.4 g/dL (ref 6.1–8.1)

## 2015-12-10 NOTE — Patient Instructions (Addendum)
We will be checking the following labs today - BMET, CBC, HPF and lipids   Medication Instructions:    Continue with your current medicines.     Testing/Procedures To Be Arranged:  N/A  Follow-Up:   See me in 6 months    Other Special Instructions:   N/A    If you need a refill on your cardiac medications before your next appointment, please call your pharmacy.   Call the [facility] office at [phone] if you have any questions, problems or concerns.      

## 2015-12-10 NOTE — Progress Notes (Signed)
CARDIOLOGY OFFICE NOTE  Date:  12/10/2015    [name] Date of Birth: 11-11-1923 Medical Record [id number]  PCP:  Douglas Lopes, MD  Cardiologist:  [name]   Chief Complaint  Patient presents with  . Coronary Artery Disease    6 month check     History of Present Illness: [name] is a 82 y.o. male who presents today for a 6 month check. He is former patient of Dr. Sherryl [name] - now following with me.   He has a history of known CAD. Back in January of 2016 he presented for planned coronary angiography after an abnormal outpatient nuclear stress test. Had PCI with DES x 2 to the mid RCA due to subtotal occlusion. Has normal EF. On DAPT for one year. Noted to have residual disease in the LAD which will be managed medically. Low dose beta blocker and Imdur added at discharge. Other issues include a history of hairy cell leukemia, HTN, HLD and PUD.   Last seen back in April - was doing well. Was noted to have some DOE with carrying items in his arms. We did get his echo updated - this looked stable.   Comes back today. Here with his wife today. Doing well. Soon to be turning 89. No chest pain. He does get some shortness of breath if walking outside up inclines - does not happen with walking on the treadmill of riding the bike. He is not exercising as regularly but still trying to "keep moving". Some bruising. Wife is asking about Plavix - his year is up but they wish to remain on and do not wish to stop. No falls this year but apparently had several last year.   Past Medical History:  Diagnosis Date  . Acute appendicitis   . Anemia   . Anxiety   . Arthritis    "back" (03/14/2014)  . CAD (coronary artery disease)    a. 03/14/14  s/p overlapping DES x2 to mid-distal RCA.  . 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 Colon cancer ([name]) 1984  . Compression fracture of lumbar spine, non-traumatic ([name])   . DJD (degenerative joint disease) of  lumbar spine   . Dyslipidemia   . Elevated PSA   . GERD (gastroesophageal reflux disease)   . Glaucoma   . Hairy cell leukemia ([place]) dx'd 1980  . History of blood transfusion    "several; related to hairy cell leukemia & tx "  . History of stomach ulcers 1968  . Hypertension   . Malnutrition ([place])   . Osteoarthritis   . Osteoporosis   . Skin cancer of face     Past Surgical History:  Procedure Laterality Date  . APPENDECTOMY  10/2007  . CATARACT EXTRACTION, BILATERAL Bilateral 02/2008  . COLON SURGERY  1984   "sigmoid; open"  . CORONARY ANGIOPLASTY WITH STENT PLACEMENT  03/14/2014   "2"  . FRACTURE SURGERY    . LEFT HEART CATHETERIZATION WITH CORONARY ANGIOGRAM N/A 03/14/2014   Procedure: LEFT HEART CATHETERIZATION WITH CORONARY ANGIOGRAM;  Surgeon: Peter M Martinique, MD;  Location: [facility] CATH LAB;  Service: Cardiovascular;  Laterality: N/A;  . LIPOMA EXCISION Right 07/2011   liposarcoma resection; "back"  . MOHS SURGERY Left 02/2011  . MOHS SURGERY  X 3   "all on my face"  . MOLE REMOVAL Left 1985   cheek  . ORIF ANKLE FRACTURE Left 2000  . SHOULDER SURGERY  04/2004  . SPLENECTOMY  1992  .  TONSILLECTOMY AND ADENOIDECTOMY  1939     Medications: Current Outpatient Prescriptions  Medication Sig Dispense Refill  . aspirin 81 MG tablet Take 81 mg by mouth daily.    Marland Kitchen atorvastatin (LIPITOR) 20 MG tablet Take 1 tablet (20 mg total) by mouth daily at 6 PM. 90 tablet 1  . Cholecalciferol (VITAMIN D-3 PO) Take 2,000 Units by mouth daily.     . clopidogrel (PLAVIX) 75 MG tablet Take 1 tablet (75 mg total) by mouth daily with breakfast. 90 tablet 1  . fluocinonide (LIDEX) 0.05 % external solution APP TOPICALLY TO SCALP AND EARS twice daily as needed FOR DERMATITIS  11  . isosorbide mononitrate (IMDUR) 30 MG 24 hr tablet Take 0.5 tablets (15 mg total) by mouth daily. 45 tablet 2  . LATANOPROST OP Apply 1 drop to eye at bedtime. In each eye    . metoprolol tartrate (LOPRESSOR) 25 MG tablet  Take 0.5 tablets (12.5 mg total) by mouth daily. 45 tablet 1  . NITROSTAT 0.4 MG SL tablet Place 0.4 mg under the tongue every 5 (five) minutes as needed for chest pain.   12  . pantoprazole (PROTONIX) 40 MG tablet Take 1 tablet (40 mg total) by mouth daily. 90 tablet 1  . sertraline (ZOLOFT) 50 MG tablet Take 50 mg by mouth daily.    . tamsulosin (FLOMAX) 0.4 MG CAPS capsule Take 0.8 mg by mouth daily after supper.     . timolol (BETIMOL) 0.5 % ophthalmic solution Place 1 drop into the right eye daily.      No current facility-administered medications for this visit.     Allergies: Allergies  Allergen Reactions  . Antazoline Anaphylaxis  . Antihistamines, Chlorpheniramine-Type     Nervous, jittery  . Sulfa Antibiotics Rash    Social History: The patient  reports that he has quit smoking. His smoking use included Cigarettes and Cigars. He has a 0.25 pack-year smoking history. He has never used smokeless tobacco. He reports that he drinks about 5.4 oz of alcohol per week . He reports that he does not use drugs.   Family History: The patient's family history includes Congestive Heart Failure (age of onset: 6) in his father; Diabetes Mellitus II in his brother; Heart Problems in his brother; Prostate cancer in his brother; Stroke in his mother.   Review of Systems: Please see the history of present illness.   Otherwise, the review of systems is positive for none.   All other systems are reviewed and negative.   Physical Exam: VS:  BP 118/64   Pulse 64   Ht 5\' 4"  (1.626 m)   Wt 167 lb 12.8 oz (76.1 kg)   BMI 28.80 kg/m  .  BMI Body mass index is 28.8 kg/m.  Wt Readings from Last 3 Encounters:  12/10/15 167 lb 12.8 oz (76.1 kg)  06/05/15 170 lb 12.8 oz (77.5 kg)  12/04/14 167 lb 12.8 oz (76.1 kg)    General: Pleasant. Elderly male who is alert and in no acute distress.   HEENT: Normal.  Neck: Supple, no JVD, carotid bruits, or masses noted.  Cardiac: Regular rate and rhythm.  Soft outflow murmur. No edema.  Respiratory:  Lungs are clear to auscultation bilaterally with normal work of breathing.  GI: Soft and nontender.  MS: No deformity or atrophy. Gait and ROM intact.  Skin: Warm and dry. Color is normal.  Neuro:  Strength and sensation are intact and no gross focal deficits noted.  Psych: Alert,  appropriate and with normal affect.   LABORATORY DATA:  EKG:  EKG is ordered today. This shows NSR with RBBB  Lab Results  Component Value Date   WBC 6.2 06/22/2014   HGB 12.7 (L) 06/22/2014   HCT 38.2 (L) 06/22/2014   PLT 230.0 06/22/2014   GLUCOSE 96 06/05/2015   CHOL 123 (L) 06/05/2015   TRIG 130 06/05/2015   HDL 35 (L) 06/05/2015   LDLCALC 62 06/05/2015   ALT 18 06/05/2015   AST 24 06/05/2015   NA 142 06/05/2015   K 4.1 06/05/2015   CL 105 06/05/2015   CREATININE 0.94 06/05/2015   BUN 21 06/05/2015   CO2 24 06/05/2015   TSH 2.16 06/22/2014   INR 1.0 03/10/2014    BNP (last 3 results) No results for input(s): BNP in the last 8760 hours.  ProBNP (last 3 results) No results for input(s): PROBNP in the last 8760 hours.   Other Studies Reviewed Today:  Echo Study Conclusions from 06/2015  - Left ventricle: The cavity size was normal. Wall thickness was   normal. Systolic function was normal. The estimated ejection   fraction was in the range of 55% to 60%. Wall motion was normal;   there were no regional wall motion abnormalities. Doppler   parameters are consistent with abnormal left ventricular   relaxation (grade 1 diastolic dysfunction). Doppler parameters   are consistent with high ventricular filling pressure. - Aortic valve: Valve mobility was restricted. There was mild   stenosis. There was mild regurgitation. Valve area (VTI): 1.65   cm^2. Valve area (Vmax): 1.43 cm^2. Valve area (Vmean): 1.53   cm^2. - Aortic root: The aortic root was mildly dilated. - Ascending aorta: The ascending aorta was mildly dilated. - Left atrium: The  atrium was mildly dilated. - Atrial septum: There was increased thickness of the septum,   consistent with lipomatous hypertrophy.  Impressions:  - Normal LV function; grade 1 diastolic dysfunction; mild LAE;   calcified aortic valve with mild AS (mean gradient 14 mmHg); mild   AI; trace MR; oscillating density in LV likely calcified,   redundant MV chordae; prominent lipomatous hypertrophy of atrial   septum.  Coronary angiography 03/2014:  Left mainstem: Moderately calcified without significant disease.   Left anterior descending (LAD): The LAD has moderate calcification in the proximal and mid vessel. Immediately after the second diagonal there is a focal 95% stenosis. Following the third diagonal the LAD is occluded. It fills by left to left collaterals.   [place] intermediate: this is a very large branch with 30% stenosis in the proximal vessel.   Left circumflex (LCx): this is a relatively small branch without significant disease.   Right coronary artery (RCA): The RCA is small and diffusely diseased. The mid vessel is calcified with a 99% stenosis and TIMI 1 flow. The distal RCA branches fill by left to right collaterals.   Left ventriculography: Left ventricular systolic function is normal, LVEF is estimated at 65%, there is no significant mitral regurgitation   PCI Note:  Following the diagnostic procedure, the decision was made to proceed with PCI of the RCA.  Weight-based bivalirudin was given for anticoagulation. Plavix 600 mg was given orally.  Once a therapeutic ACT was achieved, a 6 [place] FR4 guide catheter was inserted.  A prowater coronary guidewire was used to cross the lesion.  The lesion was predilated with a 2.0 mm balloon. After better perfusion was improved there was a lesion noted in the distal RCA  as well.  This lesion was then stented with a 2.25 x 16 mm Promus stent.  The stent was postdilated with a 2.5 mm noncompliant balloon.  The mid vessel lesion was  then stented with a 2.5 x 20 mm Promus stent. This was post dilated with a 2.5 mm noncompliant balloon. Following PCI, there was 0% residual stenosis and TIMI-3 flow. Final angiography confirmed an excellent result. The patient tolerated the procedure well. There were no immediate procedural complications. A TR band was used for radial hemostasis. The patient was transferred to the post catheterization recovery area for further monitoring.  PCI Data: Vessel - RCA/Segment - mid and distal Percent Stenosis (pre)  99% and 80% TIMI-flow 1 Stent 2.5 x 20 mm Promus and 2.25 x 16 mm Promus respectively Percent Stenosis (post) 0% TIMI-flow (post) 3  Final Conclusions:   1. Severe 2 vessel obstructive CAD. The patient has chronic total occlusion of the distal LAD with collaterals. There is a severe lesion in the LAD after the second diagonal but this lesion supplies only a small third diagonal branch. The mid RCA had a subtotal occlusion. 2. Normal LV function.  Recommendations:  Continue DAPT for one year. We will treat residual disease in LAD medically. Will add low dose metoprolol and isosorbide. Anticipate DC in am.   Peter Martinique, [place] 03/14/2014, 12:34 PM   Assessment/Plan: 1. CAD - with prior cath from 03/2014 with PCI to the mid RCA - remains on DAPT therapy and does not wish to stop. Normal LV function. Residual LAD disease to be treated medically. He continues to do well clinically. No symptoms noted.   2. HTN - BP great on his current regimen. No changes made.  3. HLD - remains on statin therapy - recheck labs today.  4. Aortic stenosis - echo from April is stable.   5. History of hairy cell leukemia followed at [facility]. Stable  6. Advanced age - doing pretty well overall.    Current medicines are reviewed with the patient today.  The patient does not have concerns regarding medicines other than what has been noted above.  The following changes have been made:  See  above.  Labs/ tests ordered today include:    Orders Placed This Encounter  Procedures  . Basic metabolic panel  . Hepatic function panel  . Lipid panel  . EKG 12-Lead     Disposition:   FU with me in 6 months. Labs today.   Patient is agreeable to this plan and will call if any problems develop in the interim.   Signed: Burtis Junes, RN, ANP-C 12/10/2015 9:31 AM  [facility] [address] [place], [place]  [address] Phone: [phone] Fax: [phone]

## 2015-12-12 DIAGNOSIS — H35412 Lattice degeneration of retina, left eye: Secondary | ICD-10-CM | POA: Diagnosis not present

## 2015-12-12 DIAGNOSIS — H35341 Macular cyst, hole, or pseudohole, right eye: Secondary | ICD-10-CM | POA: Diagnosis not present

## 2015-12-12 DIAGNOSIS — H353220 Exudative age-related macular degeneration, left eye, stage unspecified: Secondary | ICD-10-CM | POA: Diagnosis not present

## 2015-12-12 DIAGNOSIS — H35372 Puckering of macula, left eye: Secondary | ICD-10-CM | POA: Diagnosis not present

## 2015-12-12 DIAGNOSIS — H353110 Nonexudative age-related macular degeneration, right eye, stage unspecified: Secondary | ICD-10-CM | POA: Diagnosis not present

## 2015-12-12 DIAGNOSIS — H43821 Vitreomacular adhesion, right eye: Secondary | ICD-10-CM | POA: Diagnosis not present

## 2015-12-19 ENCOUNTER — Ambulatory Visit: Payer: Medicare Other | Admitting: Nurse Practitioner

## 2015-12-25 DIAGNOSIS — J984 Other disorders of lung: Secondary | ICD-10-CM | POA: Diagnosis not present

## 2015-12-25 DIAGNOSIS — R972 Elevated prostate specific antigen [PSA]: Secondary | ICD-10-CM | POA: Diagnosis not present

## 2015-12-25 DIAGNOSIS — C9140 Hairy cell leukemia not having achieved remission: Secondary | ICD-10-CM | POA: Diagnosis not present

## 2015-12-25 DIAGNOSIS — Z85038 Personal history of other malignant neoplasm of large intestine: Secondary | ICD-10-CM | POA: Diagnosis not present

## 2015-12-25 DIAGNOSIS — Z856 Personal history of leukemia: Secondary | ICD-10-CM | POA: Diagnosis not present

## 2015-12-25 DIAGNOSIS — D649 Anemia, unspecified: Secondary | ICD-10-CM | POA: Diagnosis not present

## 2015-12-25 DIAGNOSIS — D696 Thrombocytopenia, unspecified: Secondary | ICD-10-CM | POA: Diagnosis not present

## 2015-12-25 DIAGNOSIS — Z9081 Acquired absence of spleen: Secondary | ICD-10-CM | POA: Diagnosis not present

## 2015-12-25 DIAGNOSIS — Z7982 Long term (current) use of aspirin: Secondary | ICD-10-CM | POA: Diagnosis not present

## 2015-12-25 DIAGNOSIS — Z85828 Personal history of other malignant neoplasm of skin: Secondary | ICD-10-CM | POA: Diagnosis not present

## 2015-12-25 DIAGNOSIS — Z9049 Acquired absence of other specified parts of digestive tract: Secondary | ICD-10-CM | POA: Diagnosis not present

## 2015-12-25 DIAGNOSIS — Z79899 Other long term (current) drug therapy: Secondary | ICD-10-CM | POA: Diagnosis not present

## 2015-12-25 DIAGNOSIS — H6690 Otitis media, unspecified, unspecified ear: Secondary | ICD-10-CM | POA: Diagnosis not present

## 2015-12-25 DIAGNOSIS — M009 Pyogenic arthritis, unspecified: Secondary | ICD-10-CM | POA: Diagnosis not present

## 2015-12-25 DIAGNOSIS — R911 Solitary pulmonary nodule: Secondary | ICD-10-CM | POA: Diagnosis not present

## 2015-12-25 DIAGNOSIS — R05 Cough: Secondary | ICD-10-CM | POA: Diagnosis not present

## 2015-12-25 DIAGNOSIS — Z08 Encounter for follow-up examination after completed treatment for malignant neoplasm: Secondary | ICD-10-CM | POA: Diagnosis not present

## 2015-12-25 DIAGNOSIS — H539 Unspecified visual disturbance: Secondary | ICD-10-CM | POA: Diagnosis not present

## 2015-12-25 DIAGNOSIS — F4321 Adjustment disorder with depressed mood: Secondary | ICD-10-CM | POA: Diagnosis not present

## 2015-12-25 DIAGNOSIS — H9202 Otalgia, left ear: Secondary | ICD-10-CM | POA: Diagnosis not present

## 2016-01-01 DIAGNOSIS — L570 Actinic keratosis: Secondary | ICD-10-CM | POA: Diagnosis not present

## 2016-01-01 DIAGNOSIS — L218 Other seborrheic dermatitis: Secondary | ICD-10-CM | POA: Diagnosis not present

## 2016-01-01 DIAGNOSIS — Z85828 Personal history of other malignant neoplasm of skin: Secondary | ICD-10-CM | POA: Diagnosis not present

## 2016-01-01 DIAGNOSIS — L820 Inflamed seborrheic keratosis: Secondary | ICD-10-CM | POA: Diagnosis not present

## 2016-01-08 DIAGNOSIS — H35373 Puckering of macula, bilateral: Secondary | ICD-10-CM | POA: Diagnosis not present

## 2016-01-08 DIAGNOSIS — H353110 Nonexudative age-related macular degeneration, right eye, stage unspecified: Secondary | ICD-10-CM | POA: Diagnosis not present

## 2016-01-08 DIAGNOSIS — H353220 Exudative age-related macular degeneration, left eye, stage unspecified: Secondary | ICD-10-CM | POA: Diagnosis not present

## 2016-01-08 DIAGNOSIS — H43821 Vitreomacular adhesion, right eye: Secondary | ICD-10-CM | POA: Diagnosis not present

## 2016-01-11 ENCOUNTER — Other Ambulatory Visit: Payer: Self-pay | Admitting: *Deleted

## 2016-01-11 MED ORDER — CLOPIDOGREL BISULFATE 75 MG PO TABS
75.0000 mg | ORAL_TABLET | Freq: Every day | ORAL | 3 refills | Status: DC
Start: 2016-01-11 — End: 2016-04-30

## 2016-01-11 MED ORDER — ATORVASTATIN CALCIUM 20 MG PO TABS: 20.0000 mg | ORAL_TABLET | Freq: Every day | ORAL | 3 refills | Status: DC

## 2016-01-15 DIAGNOSIS — H906 Mixed conductive and sensorineural hearing loss, bilateral: Secondary | ICD-10-CM | POA: Diagnosis not present

## 2016-01-15 DIAGNOSIS — H903 Sensorineural hearing loss, bilateral: Secondary | ICD-10-CM | POA: Insufficient documentation

## 2016-01-15 DIAGNOSIS — H90A22 Sensorineural hearing loss, unilateral, left ear, with restricted hearing on the contralateral side: Secondary | ICD-10-CM | POA: Diagnosis not present

## 2016-01-15 DIAGNOSIS — H608X3 Other otitis externa, bilateral: Secondary | ICD-10-CM | POA: Insufficient documentation

## 2016-01-15 DIAGNOSIS — H90A32 Mixed conductive and sensorineural hearing loss, unilateral, left ear with restricted hearing on the contralateral side: Secondary | ICD-10-CM | POA: Diagnosis not present

## 2016-01-28 DIAGNOSIS — H608X3 Other otitis externa, bilateral: Secondary | ICD-10-CM | POA: Diagnosis not present

## 2016-01-28 DIAGNOSIS — H90A22 Sensorineural hearing loss, unilateral, left ear, with restricted hearing on the contralateral side: Secondary | ICD-10-CM | POA: Diagnosis not present

## 2016-01-28 DIAGNOSIS — H90A32 Mixed conductive and sensorineural hearing loss, unilateral, left ear with restricted hearing on the contralateral side: Secondary | ICD-10-CM | POA: Diagnosis not present

## 2016-01-29 DIAGNOSIS — I10 Essential (primary) hypertension: Secondary | ICD-10-CM | POA: Diagnosis not present

## 2016-01-29 DIAGNOSIS — E784 Other hyperlipidemia: Secondary | ICD-10-CM | POA: Diagnosis not present

## 2016-01-29 DIAGNOSIS — R972 Elevated prostate specific antigen [PSA]: Secondary | ICD-10-CM | POA: Diagnosis not present

## 2016-01-29 DIAGNOSIS — Z0000 Encounter for general adult medical examination without abnormal findings: Secondary | ICD-10-CM | POA: Diagnosis not present

## 2016-02-04 DIAGNOSIS — H9193 Unspecified hearing loss, bilateral: Secondary | ICD-10-CM | POA: Diagnosis not present

## 2016-02-04 DIAGNOSIS — I2510 Atherosclerotic heart disease of native coronary artery without angina pectoris: Secondary | ICD-10-CM | POA: Diagnosis not present

## 2016-02-04 DIAGNOSIS — C189 Malignant neoplasm of colon, unspecified: Secondary | ICD-10-CM | POA: Diagnosis not present

## 2016-02-04 DIAGNOSIS — Z23 Encounter for immunization: Secondary | ICD-10-CM | POA: Diagnosis not present

## 2016-02-04 DIAGNOSIS — E784 Other hyperlipidemia: Secondary | ICD-10-CM | POA: Diagnosis not present

## 2016-02-04 DIAGNOSIS — Z1389 Encounter for screening for other disorder: Secondary | ICD-10-CM | POA: Diagnosis not present

## 2016-02-04 DIAGNOSIS — Z6828 Body mass index (BMI) 28.0-28.9, adult: Secondary | ICD-10-CM | POA: Diagnosis not present

## 2016-02-04 DIAGNOSIS — R2681 Unsteadiness on feet: Secondary | ICD-10-CM | POA: Diagnosis not present

## 2016-02-04 DIAGNOSIS — I10 Essential (primary) hypertension: Secondary | ICD-10-CM | POA: Diagnosis not present

## 2016-02-04 DIAGNOSIS — R972 Elevated prostate specific antigen [PSA]: Secondary | ICD-10-CM | POA: Diagnosis not present

## 2016-02-04 DIAGNOSIS — Z0000 Encounter for general adult medical examination without abnormal findings: Secondary | ICD-10-CM | POA: Diagnosis not present

## 2016-02-05 DIAGNOSIS — H35373 Puckering of macula, bilateral: Secondary | ICD-10-CM | POA: Diagnosis not present

## 2016-02-05 DIAGNOSIS — H353220 Exudative age-related macular degeneration, left eye, stage unspecified: Secondary | ICD-10-CM | POA: Diagnosis not present

## 2016-02-05 DIAGNOSIS — H35412 Lattice degeneration of retina, left eye: Secondary | ICD-10-CM | POA: Diagnosis not present

## 2016-02-05 DIAGNOSIS — H43821 Vitreomacular adhesion, right eye: Secondary | ICD-10-CM | POA: Diagnosis not present

## 2016-02-05 DIAGNOSIS — H353110 Nonexudative age-related macular degeneration, right eye, stage unspecified: Secondary | ICD-10-CM | POA: Diagnosis not present

## 2016-03-06 DIAGNOSIS — H43821 Vitreomacular adhesion, right eye: Secondary | ICD-10-CM | POA: Diagnosis not present

## 2016-03-06 DIAGNOSIS — H353110 Nonexudative age-related macular degeneration, right eye, stage unspecified: Secondary | ICD-10-CM | POA: Diagnosis not present

## 2016-03-06 DIAGNOSIS — H36 Retinal disorders in diseases classified elsewhere: Secondary | ICD-10-CM | POA: Diagnosis not present

## 2016-03-06 DIAGNOSIS — H35412 Lattice degeneration of retina, left eye: Secondary | ICD-10-CM | POA: Diagnosis not present

## 2016-03-06 DIAGNOSIS — H35373 Puckering of macula, bilateral: Secondary | ICD-10-CM | POA: Diagnosis not present

## 2016-03-06 DIAGNOSIS — H353220 Exudative age-related macular degeneration, left eye, stage unspecified: Secondary | ICD-10-CM | POA: Diagnosis not present

## 2016-03-10 DIAGNOSIS — R972 Elevated prostate specific antigen [PSA]: Secondary | ICD-10-CM | POA: Diagnosis not present

## 2016-03-10 DIAGNOSIS — N402 Nodular prostate without lower urinary tract symptoms: Secondary | ICD-10-CM | POA: Diagnosis not present

## 2016-03-10 DIAGNOSIS — H903 Sensorineural hearing loss, bilateral: Secondary | ICD-10-CM | POA: Diagnosis not present

## 2016-03-24 ENCOUNTER — Other Ambulatory Visit: Payer: Self-pay | Admitting: *Deleted

## 2016-03-24 MED ORDER — METOPROLOL TARTRATE 25 MG PO TABS: 12.5000 mg | ORAL_TABLET | Freq: Every day | ORAL | 1 refills | Status: DC

## 2016-03-24 MED ORDER — PANTOPRAZOLE SODIUM 40 MG PO TBEC: 40.0000 mg | DELAYED_RELEASE_TABLET | Freq: Every day | ORAL | 1 refills | Status: DC

## 2016-04-01 DIAGNOSIS — H353221 Exudative age-related macular degeneration, left eye, with active choroidal neovascularization: Secondary | ICD-10-CM | POA: Diagnosis not present

## 2016-04-01 DIAGNOSIS — H43821 Vitreomacular adhesion, right eye: Secondary | ICD-10-CM | POA: Diagnosis not present

## 2016-04-01 DIAGNOSIS — H35412 Lattice degeneration of retina, left eye: Secondary | ICD-10-CM | POA: Diagnosis not present

## 2016-04-01 DIAGNOSIS — H35373 Puckering of macula, bilateral: Secondary | ICD-10-CM | POA: Diagnosis not present

## 2016-04-01 DIAGNOSIS — H353110 Nonexudative age-related macular degeneration, right eye, stage unspecified: Secondary | ICD-10-CM | POA: Diagnosis not present

## 2016-04-22 DIAGNOSIS — C9140 Hairy cell leukemia not having achieved remission: Secondary | ICD-10-CM | POA: Diagnosis not present

## 2016-04-29 ENCOUNTER — Telehealth: Payer: Self-pay | Admitting: Nurse Practitioner

## 2016-04-29 DIAGNOSIS — H35373 Puckering of macula, bilateral: Secondary | ICD-10-CM | POA: Diagnosis not present

## 2016-04-29 DIAGNOSIS — H353220 Exudative age-related macular degeneration, left eye, stage unspecified: Secondary | ICD-10-CM | POA: Diagnosis not present

## 2016-04-29 DIAGNOSIS — H43821 Vitreomacular adhesion, right eye: Secondary | ICD-10-CM | POA: Diagnosis not present

## 2016-04-29 DIAGNOSIS — H35412 Lattice degeneration of retina, left eye: Secondary | ICD-10-CM | POA: Diagnosis not present

## 2016-04-29 DIAGNOSIS — H353110 Nonexudative age-related macular degeneration, right eye, stage unspecified: Secondary | ICD-10-CM | POA: Diagnosis not present

## 2016-04-29 NOTE — Progress Notes (Signed)
CARDIOLOGY OFFICE NOTE   Date:  04/30/2016    [name] Date of Birth: [birth year] Medical Record #[id number]  PCP:  [name], MD  Cardiologist:  Servando Snare   Chief Complaint  Patient presents with  . Coronary Artery Disease    Work in visit to discuss medicines     History of Present Illness: [name] is a 83 y.o. male who presents today for a work in visit. He is former patient of Dr. Sherryl Barters - now following with me.   He has a history of known CAD. Back in January of 2016 he presented for planned coronary angiography after an abnormal outpatient nuclear stress test. Had PCI with DES x 2 to the mid RCA due to subtotal occlusion. Has normal EF. On DAPT for one year. Noted to have residual disease in the LAD which will be managed medically. Low dose beta blocker and Imdur added at discharge. Other issues include a history of hairy cell leukemia, HTN, HLD and PUD.   Seen back last April - was doing well. Was noted to have some DOE with carrying items in his arms. We did get his echo updated - this looked stable. I last saw him back in October - he was doing ok. Some bruising. He elected to stay on his Plavix despite his one year of DAPT being completed.   Phone call yesterday - "S/w pt stated over the years pt has had leukemia and colon cancer.  In October pt's platelet count was 152, pt had physical in November, platelet count was 130, pt saw hematologist 1 week ago and platelet count was 100.   Last night pt scratched head and was bleeding profusely for two hours.  Finally enough direct pressure for 15 minutes stopped bleeding. Stated don't you think this is a hematology issue. Is going to [facility] April 5 to get this all checked.   Is there any medication's that could cause pt to bleed.  Stated Plavix. Pt has aortic valve. Stated will t/w [name] in the am but also made appointment for tomorrow,  last held spot in case pt had to come in.  Pt agreeable to treatment  plan and stated appreciation. Will send to [name] to advise"  Thus added to today.   Comes back today. Here with his wife today. He is feeling good from our standpoint. No chest pain. Breathing is good. Admits he is slowing down. Only going to walk on the treadmill about once a week. He is sleeping later in the mornings and staying up later at night. He has had multiple skin cancers - probably has one on his head - bleeding every time he touches it. Platelet count has dropped. May be needing a bone marrow biopsy. Wife notes that with every "nick/cut" he will "bleed all over the place". Most recent labs from [place] [facility] noted. Hemoglobin ok. He is now over 2 years out from his DES stent. He has had no chest pain.    Past Medical History:  Diagnosis Date  . Acute appendicitis   . Anemia   . Anxiety   . Arthritis    "back" (03/14/2014)  . CAD (coronary artery disease)    a. 03/14/14  s/p overlapping DES x2 to mid-distal RCA.  . 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 Colon cancer ([place]) 1984  . Compression fracture of lumbar spine, non-traumatic ([place])   . DJD (degenerative joint disease) of lumbar spine   . Dyslipidemia   .  Elevated PSA   . GERD (gastroesophageal reflux disease)   . Glaucoma   . Hairy cell leukemia ([place]) dx'd 1980  . History of blood transfusion    "several; related to hairy cell leukemia & tx "  . History of stomach ulcers 1968  . Hypertension   . Malnutrition ([place])   . Osteoarthritis   . Osteoporosis   . Skin cancer of face     Past Surgical History:  Procedure Laterality Date  . APPENDECTOMY  10/2007  . CATARACT EXTRACTION, BILATERAL Bilateral 02/2008  . COLON SURGERY  1984   "sigmoid; open"  . CORONARY ANGIOPLASTY WITH STENT PLACEMENT  03/14/2014   "2"  . FRACTURE SURGERY    . LEFT HEART CATHETERIZATION WITH CORONARY ANGIOGRAM N/A 03/14/2014   Procedure: LEFT HEART CATHETERIZATION WITH CORONARY ANGIOGRAM;  Surgeon: Peter M Martinique, MD;  Location: [facility]  CATH LAB;  Service: Cardiovascular;  Laterality: N/A;  . LIPOMA EXCISION Right 07/2011   liposarcoma resection; "back"  . MOHS SURGERY Left 02/2011  . MOHS SURGERY  X 3   "all on my face"  . MOLE REMOVAL Left 1985   cheek  . ORIF ANKLE FRACTURE Left 2000  . SHOULDER SURGERY  04/2004  . SPLENECTOMY  1992  . TONSILLECTOMY AND ADENOIDECTOMY  1939     Medications: Current Outpatient Prescriptions  Medication Sig Dispense Refill  . aspirin 81 MG tablet Take 81 mg by mouth daily.    Marland Kitchen atorvastatin (LIPITOR) 20 MG tablet Take 1 tablet (20 mg total) by mouth daily at 6 PM. 90 tablet 3  . Cholecalciferol (VITAMIN D-3 PO) Take 2,000 Units by mouth daily.     . fluocinonide (LIDEX) 0.05 % external solution APP TOPICALLY TO SCALP AND EARS twice daily as needed FOR DERMATITIS  11  . isosorbide mononitrate (IMDUR) 30 MG 24 hr tablet Take 0.5 tablets (15 mg total) by mouth daily. 45 tablet 2  . LATANOPROST OP Apply 1 drop to eye at bedtime. In each eye    . metoprolol tartrate (LOPRESSOR) 25 MG tablet Take 0.5 tablets (12.5 mg total) by mouth daily. 45 tablet 1  . NITROSTAT 0.4 MG SL tablet Place 0.4 mg under the tongue every 5 (five) minutes as needed for chest pain.   12  . pantoprazole (PROTONIX) 40 MG tablet Take 1 tablet (40 mg total) by mouth daily. 90 tablet 1  . sertraline (ZOLOFT) 50 MG tablet Take 50 mg by mouth daily.    . tamsulosin (FLOMAX) 0.4 MG CAPS capsule Take 0.8 mg by mouth daily after supper.     . timolol (BETIMOL) 0.5 % ophthalmic solution Place 1 drop into the right eye daily.      No current facility-administered medications for this visit.     Allergies: Allergies  Allergen Reactions  . Antazoline Anaphylaxis  . Antihistamines, Chlorpheniramine-Type     Nervous, jittery  . Sulfa Antibiotics Rash    Social History: The patient  reports that he has quit smoking. His smoking use included Cigarettes and Cigars. He has a 0.25 pack-year smoking history. He has never  used smokeless tobacco. He reports that he drinks about 5.4 oz of alcohol per week . He reports that he does not use drugs.   Family History: The patient's family history includes Congestive Heart Failure (age of onset: 5) in his father; Diabetes Mellitus II in his brother; Heart Problems in his brother; Prostate cancer in his brother; Stroke in his mother.   Review of Systems:  Please see the history of present illness.   Otherwise, the review of systems is positive for none.   All other systems are reviewed and negative.   Physical Exam: VS:  BP 132/64   Pulse (!) 56   Ht '5\' 4"'  (1.626 m)   Wt 169 lb 12.8 oz (77 kg)   BMI 29.15 kg/m  .  BMI Body mass index is 29.15 kg/m.  Wt Readings from Last 3 Encounters:  04/30/16 169 lb 12.8 oz (77 kg)  12/10/15 167 lb 12.8 oz (76.1 kg)  06/05/15 170 lb 12.8 oz (77.5 kg)    General: Pleasant. Elderly male. He is quite talkative. He is alert and in no acute distress.   HEENT: Normal.  Neck: Supple, no JVD, carotid bruits, or masses noted.  Cardiac: Regular rate and rhythm. Soft outflow murmur. No edema.  Respiratory:  Lungs are clear to auscultation bilaterally with normal work of breathing.  GI: Soft and nontender.  MS: No deformity or atrophy. Gait and ROM intact.  Skin: Warm and dry. Color is normal.  Neuro:  Strength and sensation are intact and no gross focal deficits noted.  Psych: Alert, appropriate and with normal affect.   LABORATORY DATA:  EKG:  EKG is not ordered today.   Lab Results  Component Value Date   WBC 7.0 12/10/2015   HGB 13.3 12/10/2015   HCT 40.6 12/10/2015   PLT 152 12/10/2015   GLUCOSE 85 12/10/2015   CHOL 122 (L) 12/10/2015   TRIG 154 (H) 12/10/2015   HDL 33 (L) 12/10/2015   LDLCALC 58 12/10/2015   ALT 19 12/10/2015   AST 22 12/10/2015   NA 141 12/10/2015   K 3.9 12/10/2015   CL 106 12/10/2015   CREATININE 1.06 12/10/2015   BUN 23 12/10/2015   CO2 25 12/10/2015   TSH 2.16 06/22/2014   INR 1.0  03/10/2014    BNP (last 3 results) No results for input(s): BNP in the last 8760 hours.  ProBNP (last 3 results) No results for input(s): PROBNP in the last 8760 hours.   Other Studies Reviewed Today:  Echo Study Conclusions from 06/2015  - Left ventricle: The cavity size was normal. Wall thickness was normal. Systolic function was normal. The estimated ejection fraction was in the range of 55% to 60%. Wall motion was normal; there were no regional wall motion abnormalities. Doppler parameters are consistent with abnormal left ventricular relaxation (grade 1 diastolic dysfunction). Doppler parameters are consistent with high ventricular filling pressure. - Aortic valve: Valve mobility was restricted. There was mild stenosis. There was mild regurgitation. Valve area (VTI): 1.65 cm^2. Valve area (Vmax): 1.43 cm^2. Valve area (Vmean): 1.53 cm^2. - Aortic root: The aortic root was mildly dilated. - Ascending aorta: The ascending aorta was mildly dilated. - Left atrium: The atrium was mildly dilated. - Atrial septum: There was increased thickness of the septum, consistent with lipomatous hypertrophy.  Impressions:  - Normal LV function; grade 1 diastolic dysfunction; mild LAE; calcified aortic valve with mild AS (mean gradient 14 mmHg); mild AI; trace MR; oscillating density in LV likely calcified, redundant MV chordae; prominent lipomatous hypertrophy of atrial septum.  Coronary angiography 03/2014:  Left mainstem:Moderately calcified without significant disease.   Left anterior descending (LAD):The LAD has moderate calcification in the proximal and mid vessel. Immediately after the second diagonal there is a focal 95% stenosis. Following the third diagonal the LAD is occluded. It fills by left to left collaterals.   [place] intermediate: this  is a very large branch with 30% stenosis in the proximal vessel.   Left circumflex (LCx):this is  a relatively small branch without significant disease.   Right coronary artery (RCA):The RCA is small and diffusely diseased. The mid vessel is calcified with a 99% stenosis and TIMI 1 flow. The distal RCA branches fill by left to right collaterals.   Left ventriculography: Left ventricular systolic function is normal, LVEF is estimated at 65%, there is no significant mitral regurgitation   PCI Note: Following the diagnostic procedure, the decision was made to proceed with PCIof the RCA. Weight-based bivalirudin was given for anticoagulation.Plavix 600 mg was given orally. Once a therapeutic ACT was achieved, a 6 [place] FR4guide catheter was inserted. A prowatercoronary guidewire was used to cross the lesion. The lesion was predilated with a 2.0 mmballoon. After better perfusion was improved there was a lesion noted in the distal RCA as well. Thislesion was then stented with a 2.25 x 16 mm Promusstent. The stent was postdilated with a 2.5 mmnoncompliant balloon. The mid vessel lesion was then stented with a 2.5 x 20 mm Promus stent. This was post dilated with a 2.5 mm noncompliant balloon. Following PCI, there was 0% residual stenosis and TIMI-3 flow. Final angiography confirmed an excellent result. The patient tolerated the procedure well. There were no immediate procedural complications. A TR band was used for radial hemostasis. The patient was transferred to the post catheterization recovery area for further monitoring.  PCI Data: Vessel - RCA/Segment - mid and distal Percent Stenosis (pre) 99% and 80% TIMI-flow 1 Stent 2.5 x 20 mm Promus and 2.25 x 16 mm Promus respectively Percent Stenosis (post) 0% TIMI-flow (post) 3  Final Conclusions:  1. Severe 2 vessel obstructive CAD. The patient has chronic total occlusion of the distal LAD with collaterals. There is a severe lesion in the LAD after the second diagonal but this lesion supplies only a small third diagonal branch.  The mid RCA had a subtotalocclusion. 2. Normal LV function.  Recommendations:  Continue DAPT for one year. We will treat residual disease in LAD medically. Will add low dose metoprolol and isosorbide. Anticipate DC in am.   Peter Martinique, [place] 03/14/2014, 12:34 PM   Assessment/Plan: 1. Worsening thrombocytopenia - he is over 2 years out from his stent. Will stop the Plavix and just continue low dose aspirin. He is being seen down at [facility]. I suspect this is mostly related to his leukemia.   2. CAD - with prior cath from 03/2014 with PCI to the mid RCA - he was on DAPT for over 2 years. Normal LV function. Residual LAD disease to be treated medically. He continues to do well clinically. No symptoms noted. We are going to stop the Plavix today in light of the falling platelet count and easy bleeding/bruising.  3. HTN - BP great on his current regimen. No changes made.  4. HLD - remains on statin therapy   5. Aortic stenosis - echo from April is stable. No cardinal symptoms.  6. History of hairy cell leukemia followed at [facility]. Stable  Current medicines are reviewed with the patient today.  The patient does not have concerns regarding medicines other than what has been noted above.  The following changes have been made:  See above.  Labs/ tests ordered today include:   No orders of the defined types were placed in this encounter.    Disposition:   FU with me as planned in April.   Patient is  agreeable to this plan and will call if any problems develop in the interim.   SignedTruitt Merle, NP  04/30/2016 3:17 PM  [facility] [address] [place], [place]  [address] Phone: [phone] Fax: [phone]

## 2016-04-29 NOTE — Telephone Encounter (Signed)
Let's see him.

## 2016-04-29 NOTE — Telephone Encounter (Signed)
Follow Up:   Returning your call,concerning his medicine.t

## 2016-04-29 NOTE — Telephone Encounter (Signed)
New message      Pt want to talk to [name] about his medications

## 2016-04-29 NOTE — Telephone Encounter (Signed)
Left message on machine for pt to contact the office.   

## 2016-04-29 NOTE — Telephone Encounter (Signed)
S/w pt stated over the years pt has had leukemia and colon cancer.  In October pt's platelet count was 152, pt had physical in November, platelet count was 130, pt saw hematologist 1 week ago and platelet count was 100.   Last night pt scratched head and was bleeding profusely for two hours.  Finally enough direct pressure for 15 minutes stopped bleeding. Stated don't you think this is a hematology issue. Is going to [facility] April 5 to get this all checked.   Is there any medication's that could cause pt to bleed.  Stated Plavix. Pt has aortic valve. Stated will t/w [name] in the am but also made appointment for tomorrow,  last held spot in case pt had to come in.  Pt agreeable to treatment plan and stated appreciation. Will send to [name] to advise.

## 2016-04-29 NOTE — Telephone Encounter (Signed)
lvm

## 2016-04-30 ENCOUNTER — Encounter: Payer: Self-pay | Admitting: Nurse Practitioner

## 2016-04-30 ENCOUNTER — Ambulatory Visit (INDEPENDENT_AMBULATORY_CARE_PROVIDER_SITE_OTHER): Payer: Medicare Other | Admitting: Nurse Practitioner

## 2016-04-30 VITALS — BP 132/64 | HR 56 | Ht 64.0 in | Wt 169.8 lb

## 2016-04-30 DIAGNOSIS — D696 Thrombocytopenia, unspecified: Secondary | ICD-10-CM | POA: Diagnosis not present

## 2016-04-30 DIAGNOSIS — T148XXA Other injury of unspecified body region, initial encounter: Secondary | ICD-10-CM | POA: Diagnosis not present

## 2016-04-30 DIAGNOSIS — I119 Hypertensive heart disease without heart failure: Secondary | ICD-10-CM | POA: Diagnosis not present

## 2016-04-30 DIAGNOSIS — I350 Nonrheumatic aortic (valve) stenosis: Secondary | ICD-10-CM

## 2016-04-30 DIAGNOSIS — I259 Chronic ischemic heart disease, unspecified: Secondary | ICD-10-CM | POA: Diagnosis not present

## 2016-04-30 NOTE — Telephone Encounter (Signed)
Pt is aware to keep appointment.

## 2016-04-30 NOTE — Patient Instructions (Addendum)
We will be checking the following labs today - NONE   Medication Instructions:    Continue with your current medicines. BUT  STOP PLAVIX    Testing/Procedures To Be Arranged:  N/A  Follow-Up:   See me in April as planned    Other Special Instructions:   Walking more!!    If you need a refill on your cardiac medications before your next appointment, please call your pharmacy.   Call the [place] office at [phone] if you have any questions, problems or concerns.

## 2016-05-27 DIAGNOSIS — H353110 Nonexudative age-related macular degeneration, right eye, stage unspecified: Secondary | ICD-10-CM | POA: Diagnosis not present

## 2016-05-27 DIAGNOSIS — H353220 Exudative age-related macular degeneration, left eye, stage unspecified: Secondary | ICD-10-CM | POA: Diagnosis not present

## 2016-05-27 DIAGNOSIS — H35372 Puckering of macula, left eye: Secondary | ICD-10-CM | POA: Diagnosis not present

## 2016-05-27 DIAGNOSIS — H35412 Lattice degeneration of retina, left eye: Secondary | ICD-10-CM | POA: Diagnosis not present

## 2016-05-27 DIAGNOSIS — H43821 Vitreomacular adhesion, right eye: Secondary | ICD-10-CM | POA: Diagnosis not present

## 2016-05-29 ENCOUNTER — Encounter: Payer: Self-pay | Admitting: Nurse Practitioner

## 2016-06-10 ENCOUNTER — Telehealth: Payer: Self-pay | Admitting: *Deleted

## 2016-06-10 NOTE — Telephone Encounter (Signed)
s/w pt whom [name] had asked to call and switch pt's appointment due to a personal email [name] received.  Inquired from pt how email was received stated, pt called office and asked for Lori's email at [facility].  Was given to pt on phone.  Pt wanted appointment moved.  Moved pt's appointment.

## 2016-06-11 ENCOUNTER — Ambulatory Visit: Payer: Medicare Other | Admitting: Nurse Practitioner

## 2016-06-12 DIAGNOSIS — R918 Other nonspecific abnormal finding of lung field: Secondary | ICD-10-CM | POA: Diagnosis not present

## 2016-06-12 DIAGNOSIS — Z08 Encounter for follow-up examination after completed treatment for malignant neoplasm: Secondary | ICD-10-CM | POA: Diagnosis not present

## 2016-06-12 DIAGNOSIS — Z85831 Personal history of malignant neoplasm of soft tissue: Secondary | ICD-10-CM | POA: Diagnosis not present

## 2016-06-12 DIAGNOSIS — C9140 Hairy cell leukemia not having achieved remission: Secondary | ICD-10-CM | POA: Diagnosis not present

## 2016-06-12 DIAGNOSIS — Z5181 Encounter for therapeutic drug level monitoring: Secondary | ICD-10-CM | POA: Diagnosis not present

## 2016-06-12 DIAGNOSIS — Z79899 Other long term (current) drug therapy: Secondary | ICD-10-CM | POA: Diagnosis not present

## 2016-06-20 ENCOUNTER — Encounter: Payer: Self-pay | Admitting: *Deleted

## 2016-06-20 ENCOUNTER — Telehealth: Payer: Self-pay | Admitting: Nurse Practitioner

## 2016-06-20 NOTE — Telephone Encounter (Signed)
Have received email to my personal [facility] account regarding his platelet count. Requesting appointment with me and Dr. Tamala Julian.  He has a visit upcoming in early May - Dr. Tamala Julian is in the office.   Will have the office staff relay this information and kindly inform him to not use my personal email for health related issues - would rather use My Chart or phone call to the office.

## 2016-06-20 NOTE — Telephone Encounter (Signed)
Spoke with pt and informed him how to properly contact his provider/office to discuss any concerns/questions.  He voices understanding and apologized for misunderstanding -- states [name] told him to email her.  I explained that she meant email through [facility] and pt understands this now.  I am sending him a MyChart message so that he understands how to send a message from this point forward.  Pt is agreeable to OV on 5/2 to discuss platelets.

## 2016-06-24 ENCOUNTER — Telehealth: Payer: Self-pay | Admitting: Interventional Cardiology

## 2016-06-24 DIAGNOSIS — H353221 Exudative age-related macular degeneration, left eye, with active choroidal neovascularization: Secondary | ICD-10-CM | POA: Diagnosis not present

## 2016-06-24 DIAGNOSIS — H353111 Nonexudative age-related macular degeneration, right eye, early dry stage: Secondary | ICD-10-CM | POA: Diagnosis not present

## 2016-06-24 DIAGNOSIS — E7402 Pompe disease: Secondary | ICD-10-CM | POA: Diagnosis not present

## 2016-06-24 DIAGNOSIS — H43821 Vitreomacular adhesion, right eye: Secondary | ICD-10-CM | POA: Diagnosis not present

## 2016-06-24 DIAGNOSIS — H35372 Puckering of macula, left eye: Secondary | ICD-10-CM | POA: Diagnosis not present

## 2016-06-24 NOTE — Telephone Encounter (Signed)
Spoke with wife, DPR on file and she states that pt and she had spoke with Dr. Tamala Julian and he had agreed he would take pt on.  She was hoping pt could get in to see Dr. Tamala Julian soon as his platelet counts have been dropping and [facility] is wanting to do a bone biopsy.  Advised her I would send message to Dr. Tamala Julian for approval.  Pt was a former [facility] pt and has been seeing Truitt Merle, NP since then.  Wife just feels like pt needs to see a MD and go over things before agreeing to move forward with bone biopsy.

## 2016-06-24 NOTE — Telephone Encounter (Signed)
New message   Pt wife is calling to speak to RN-Jennifer.

## 2016-06-25 NOTE — Telephone Encounter (Signed)
Agree with keeping appointment with [name] on same day I am in office.

## 2016-06-26 NOTE — Telephone Encounter (Signed)
Spoke with wife and advised her Dr. Tamala Julian recommended pt keep appt with [name] as Dr. Tamala Julian is in the office the same day.  Advised we can look at scheduling pt with Dr. Tamala Julian for next appt.  Wife verbalized understanding and was appreciative for assistance.

## 2016-07-01 NOTE — Progress Notes (Signed)
CARDIOLOGY OFFICE NOTE  Date:  07/02/2016    [name] Date of Birth: [birth year] Medical Record #[id number]  PCP:  [name], MD  Cardiologist:  Jennings Books    Chief Complaint  Patient presents with  . Coronary Artery Disease    Follow up visit - seen for Dr. Tamala Julian    History of Present Illness: [name] is a 83 y.o. male who presents today for a follow up visit. He is former patient of Dr. Sherryl Barters - now following with me and Dr. Tamala Julian.   He has a history of known CAD. Back in January of 2016 he presented for planned coronary angiography after an abnormal outpatient nuclear stress test. Had PCI with DES x 2 to the mid RCA due to subtotal occlusion. Has normal EF. On DAPT for one year. Noted to have residual disease in the LAD which will be managed medically. Low dose beta blocker and Imdur added as well. Other issues include a history of hairy cell leukemia, HTN, HLD and PUD.   Seen back last Dana 2017- was doing well. Was noted to have some DOE with carrying items in his arms. We did get his echo updated - this looked stable. I last saw him back in October - he was doing ok. Some bruising. He elected to stay on his Plavix despite his one year of DAPT being completed. Then he was seen back for a work in February with issues regarding his platelet count - he was having some issues with bleeding from scratching. His cardiac status was ok but sounded like he was headed towards a bone marrow biopsy - he was over 2 years out from his stent and we elected to stop the Plavix and just use aspirin.   He sent me an email a few weeks ago noting his platelet count was continuing to drop.  From [facility] - platelets are 84K last month.    Comes back today. Here with his wife today. They are concerned about the platelet count. Noted he has declined a bone marrow biopsy previously. He was suppose to have gone back yesterday for a visit - but cancelled due to  visit here today - he is however, now scheduled for repeat lab and a bone marrow biopsy - but he is considering holding off on the biopsy. He is now convinced that this low platelet count is from Imdur - "according to [employer]". He has no active chest pain. He does get short of breath with exertion - this is chronic. No syncope.   Past Medical History:  Diagnosis Date  . Acute appendicitis   . Anemia   . Anxiety   . Arthritis    "back" (03/14/2014)  . CAD (coronary artery disease)    a. 03/14/14  s/p overlapping DES x2 to mid-distal RCA.  . 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 Colon cancer ([place]) 1984  . Compression fracture of lumbar spine, non-traumatic ([place])   . DJD (degenerative joint disease) of lumbar spine   . Dyslipidemia   . Elevated PSA   . GERD (gastroesophageal reflux disease)   . Glaucoma   . Hairy cell leukemia ([place]) dx'd 1980  . History of blood transfusion    "several; related to hairy cell leukemia & tx "  . History of stomach ulcers 1968  . Hypertension   . Malnutrition ([place])   . Osteoarthritis   . Osteoporosis   . Skin cancer of face  Past Surgical History:  Procedure Laterality Date  . APPENDECTOMY  10/2007  . CATARACT EXTRACTION, BILATERAL Bilateral 02/2008  . COLON SURGERY  1984   "sigmoid; open"  . CORONARY ANGIOPLASTY WITH STENT PLACEMENT  03/14/2014   "2"  . FRACTURE SURGERY    . LEFT HEART CATHETERIZATION WITH CORONARY ANGIOGRAM N/A 03/14/2014   Procedure: LEFT HEART CATHETERIZATION WITH CORONARY ANGIOGRAM;  Surgeon: Peter M Martinique, MD;  Location: [facility] CATH LAB;  Service: Cardiovascular;  Laterality: N/A;  . LIPOMA EXCISION Right 07/2011   liposarcoma resection; "back"  . MOHS SURGERY Left 02/2011  . MOHS SURGERY  X 3   "all on my face"  . MOLE REMOVAL Left 1985   cheek  . ORIF ANKLE FRACTURE Left 2000  . SHOULDER SURGERY  04/2004  . SPLENECTOMY  1992  . TONSILLECTOMY AND ADENOIDECTOMY  1939     Medications: Current  Outpatient Prescriptions  Medication Sig Dispense Refill  . aspirin 81 MG tablet Take 81 mg by mouth daily.    Marland Kitchen atorvastatin (LIPITOR) 20 MG tablet Take 1 tablet (20 mg total) by mouth daily at 6 PM. 90 tablet 3  . Cholecalciferol (VITAMIN D-3 PO) Take 2,000 Units by mouth daily.     . fluocinonide (LIDEX) 0.05 % external solution APP TOPICALLY TO SCALP AND EARS twice daily as needed FOR DERMATITIS  11  . LATANOPROST OP Apply 1 drop to eye at bedtime. In each eye    . metoprolol tartrate (LOPRESSOR) 25 MG tablet Take 0.5 tablets (12.5 mg total) by mouth daily. 45 tablet 1  . Multiple Vitamins-Minerals (PRESERVISION AREDS 2 PO) Take 1 tablet by mouth daily.    Marland Kitchen NITROSTAT 0.4 MG SL tablet Place 0.4 mg under the tongue every 5 (five) minutes as needed for chest pain.   12  . pantoprazole (PROTONIX) 40 MG tablet Take 1 tablet (40 mg total) by mouth daily. 90 tablet 1  . sertraline (ZOLOFT) 50 MG tablet Take 50 mg by mouth daily.    . tamsulosin (FLOMAX) 0.4 MG CAPS capsule Take 0.8 mg by mouth daily after supper.     . timolol (BETIMOL) 0.5 % ophthalmic solution Place 1 drop into the right eye daily.      No current facility-administered medications for this visit.     Allergies: Allergies  Allergen Reactions  . Antazoline Anaphylaxis  . Antihistamines, Chlorpheniramine-Type     Nervous, jittery  . Sulfa Antibiotics Rash    Social History: The patient  reports that he has quit smoking. His smoking use included Cigarettes and Cigars. He has a 0.25 pack-year smoking history. He has never used smokeless tobacco. He reports that he drinks about 5.4 oz of alcohol per week . He reports that he does not use drugs.   Family History: The patient's family history includes Congestive Heart Failure (age of onset: 36) in his father; Diabetes Mellitus II in his brother; Heart Problems in his brother; Prostate cancer in his brother; Stroke in his mother.   Review of Systems: Please see the history  of present illness.   Otherwise, the review of systems is positive for none.   All other systems are reviewed and negative.   Physical Exam: VS:  BP 110/74 (BP Location: Left Arm, Patient Position: Sitting, Cuff Size: Normal)   Pulse 66   Ht '5\' 4"'  (1.626 m)   Wt 170 lb 6.4 oz (77.3 kg)   SpO2 96% Comment: at rest  BMI 29.25 kg/m  .  BMI  Body mass index is 29.25 kg/m.  Wt Readings from Last 3 Encounters:  07/02/16 170 lb 6.4 oz (77.3 kg)  04/30/16 169 lb 12.8 oz (77 kg)  12/10/15 167 lb 12.8 oz (76.1 kg)    General: Elderly male who is alert and in no acute distress.   HEENT: Normal.  Neck: Supple, no JVD, carotid bruits, or masses noted.  Cardiac: Regular rate and rhythm. Soft outflow murmur. No edema.  Respiratory:  Lungs are clear to auscultation bilaterally with normal work of breathing.  GI: Soft and nontender.  MS: No deformity or atrophy. Gait and ROM intact.  Skin: Warm and dry. Color is normal.  Neuro:  Strength and sensation are intact and no gross focal deficits noted.  Psych: Alert, appropriate and with normal affect.   LABORATORY DATA:  EKG:  EKG is not ordered today.  Lab Results  Component Value Date   WBC 7.0 12/10/2015   HGB 13.3 12/10/2015   HCT 40.6 12/10/2015   PLT 152 12/10/2015   GLUCOSE 85 12/10/2015   CHOL 122 (L) 12/10/2015   TRIG 154 (H) 12/10/2015   HDL 33 (L) 12/10/2015   LDLCALC 58 12/10/2015   ALT 19 12/10/2015   AST 22 12/10/2015   NA 141 12/10/2015   K 3.9 12/10/2015   CL 106 12/10/2015   CREATININE 1.06 12/10/2015   BUN 23 12/10/2015   CO2 25 12/10/2015   TSH 2.16 06/22/2014   INR 1.0 03/10/2014    BNP (last 3 results) No results for input(s): BNP in the last 8760 hours.  ProBNP (last 3 results) No results for input(s): PROBNP in the last 8760 hours.   Other Studies Reviewed Today:  Echo Study Conclusions from 06/2015  - Left ventricle: The cavity size was normal. Wall thickness was normal. Systolic function was  normal. The estimated ejection fraction was in the range of 55% to 60%. Wall motion was normal; there were no regional wall motion abnormalities. Doppler parameters are consistent with abnormal left ventricular relaxation (grade 1 diastolic dysfunction). Doppler parameters are consistent with high ventricular filling pressure. - Aortic valve: Valve mobility was restricted. There was mild stenosis. There was mild regurgitation. Valve area (VTI): 1.65 cm^2. Valve area (Vmax): 1.43 cm^2. Valve area (Vmean): 1.53 cm^2. - Aortic root: The aortic root was mildly dilated. - Ascending aorta: The ascending aorta was mildly dilated. - Left atrium: The atrium was mildly dilated. - Atrial septum: There was increased thickness of the septum, consistent with lipomatous hypertrophy.  Impressions:  - Normal LV function; grade 1 diastolic dysfunction; mild LAE; calcified aortic valve with mild AS (mean gradient 14 mmHg); mild AI; trace MR; oscillating density in LV likely calcified, redundant MV chordae; prominent lipomatous hypertrophy of atrial septum.  Coronary angiography 03/2014:  Left mainstem:Moderately calcified without significant disease.   Left anterior descending (LAD):The LAD has moderate calcification in the proximal and mid vessel. Immediately after the second diagonal there is a focal 95% stenosis. Following the third diagonal the LAD is occluded. It fills by left to left collaterals.   [place] intermediate: this is a very large branch with 30% stenosis in the proximal vessel.   Left circumflex (LCx):this is a relatively small branch without significant disease.   Right coronary artery (RCA):The RCA is small and diffusely diseased. The mid vessel is calcified with a 99% stenosis and TIMI 1 flow. The distal RCA branches fill by left to right collaterals.   Left ventriculography: Left ventricular systolic function is normal, LVEF  is estimated at  65%, there is no significant mitral regurgitation   PCI Note: Following the diagnostic procedure, the decision was made to proceed with PCIof the RCA. Weight-based bivalirudin was given for anticoagulation.Plavix 600 mg was given orally. Once a therapeutic ACT was achieved, a 6 [place] FR4guide catheter was inserted. A prowatercoronary guidewire was used to cross the lesion. The lesion was predilated with a 2.0 mmballoon. After better perfusion was improved there was a lesion noted in the distal RCA as well. Thislesion was then stented with a 2.25 x 16 mm Promusstent. The stent was postdilated with a 2.5 mmnoncompliant balloon. The mid vessel lesion was then stented with a 2.5 x 20 mm Promus stent. This was post dilated with a 2.5 mm noncompliant balloon. Following PCI, there was 0% residual stenosis and TIMI-3 flow. Final angiography confirmed an excellent result. The patient tolerated the procedure well. There were no immediate procedural complications. A TR band was used for radial hemostasis. The patient was transferred to the post catheterization recovery area for further monitoring.  PCI Data: Vessel - RCA/Segment - mid and distal Percent Stenosis (pre) 99% and 80% TIMI-flow 1 Stent 2.5 x 20 mm Promus and 2.25 x 16 mm Promus respectively Percent Stenosis (post) 0% TIMI-flow (post) 3  Final Conclusions:  1. Severe 2 vessel obstructive CAD. The patient has chronic total occlusion of the distal LAD with collaterals. There is a severe lesion in the LAD after the second diagonal but this lesion supplies only a small third diagonal branch. The mid RCA had a subtotalocclusion. 2. Normal LV function.  Recommendations:  Continue DAPT for one year. We will treat residual disease in LAD medically. Will add low dose metoprolol and isosorbide. Anticipate DC in am.   Peter Martinique, [place] 03/14/2014, 12:34 PM   Assessment/Plan:  1. Worsening thrombocytopenia - he is over 2  years out from his stent. His Plavix has been stopped as of February of this year - he is just on low dose aspirin - I still suspect this is mostly related to his leukemia/not having a spleen/age. He wishes to stop the Imdur - ok - needs to watch for symptoms. He will have follow up lab with his doctors at [facility]. Defer option for biopsy to them as well.   2. CAD - with prior cath from 03/2014 with PCI to the mid RCA - he was on DAPT for over 2 years. Normal LV function. Residual LAD disease to be treated medically. He continues to do well clinically. No symptoms noted. Plavix stopped back in February of 2018 due to falling platelet count and easy bleeding/bruising. Stopping Imdur today due to patient request.   3. HTN - BP great on his current regimen. No changes made.  4. HLD - remains on statin therapy   5. Aortic stenosis - echo from April of 2017 is stable. No cardinal symptoms. Would follow.   6. History of hairy cell leukemia [facility].   Current medicines are reviewed with the patient today.  The patient does not have concerns regarding medicines other than what has been noted above.  The following changes have been made:  See above.  Labs/ tests ordered today include:   No orders of the defined types were placed in this encounter.    Disposition:   FU with Dr. Tamala Julian in a few months. This plan has been discussed with Dr. Tamala Julian today in the office who is in agreement.   Patient is agreeable to this  plan and will call if any problems develop in the interim.   SignedTruitt Merle, NP  07/02/2016 9:10 AM  [facility] [address] [place], [place]  [address] Phone: [phone] Fax: [phone]

## 2016-07-02 ENCOUNTER — Ambulatory Visit (INDEPENDENT_AMBULATORY_CARE_PROVIDER_SITE_OTHER): Payer: Medicare Other | Admitting: Nurse Practitioner

## 2016-07-02 ENCOUNTER — Encounter: Payer: Self-pay | Admitting: Nurse Practitioner

## 2016-07-02 VITALS — BP 110/74 | HR 66 | Ht 64.0 in | Wt 170.4 lb

## 2016-07-02 DIAGNOSIS — I259 Chronic ischemic heart disease, unspecified: Secondary | ICD-10-CM

## 2016-07-02 DIAGNOSIS — I119 Hypertensive heart disease without heart failure: Secondary | ICD-10-CM | POA: Diagnosis not present

## 2016-07-02 NOTE — Patient Instructions (Addendum)
We will be checking the following labs today - NONE   Medication Instructions:    Continue with your current medicines. BUT  Ok to stop the Imdur    Testing/Procedures To Be Arranged:  N/A  Follow-Up:  See Dr. Tamala Julian in a few months for a visit   Other Special Instructions:   N/A    If you need a refill on your cardiac medications before your next appointment, please call your pharmacy.   Call the [place] office at [phone] if you have any questions, problems or concerns.

## 2016-07-08 DIAGNOSIS — C9140 Hairy cell leukemia not having achieved remission: Secondary | ICD-10-CM | POA: Diagnosis not present

## 2016-07-08 DIAGNOSIS — Z1379 Encounter for other screening for genetic and chromosomal anomalies: Secondary | ICD-10-CM | POA: Diagnosis not present

## 2016-07-22 DIAGNOSIS — C9141 Hairy cell leukemia, in remission: Secondary | ICD-10-CM | POA: Diagnosis not present

## 2016-07-22 DIAGNOSIS — H353110 Nonexudative age-related macular degeneration, right eye, stage unspecified: Secondary | ICD-10-CM | POA: Diagnosis not present

## 2016-07-22 DIAGNOSIS — C9140 Hairy cell leukemia not having achieved remission: Secondary | ICD-10-CM | POA: Diagnosis not present

## 2016-07-22 DIAGNOSIS — H35372 Puckering of macula, left eye: Secondary | ICD-10-CM | POA: Diagnosis not present

## 2016-07-22 DIAGNOSIS — H353221 Exudative age-related macular degeneration, left eye, with active choroidal neovascularization: Secondary | ICD-10-CM | POA: Diagnosis not present

## 2016-07-22 DIAGNOSIS — E7402 Pompe disease: Secondary | ICD-10-CM | POA: Diagnosis not present

## 2016-07-22 DIAGNOSIS — H3341 Traction detachment of retina, right eye: Secondary | ICD-10-CM | POA: Diagnosis not present

## 2016-08-19 DIAGNOSIS — H35372 Puckering of macula, left eye: Secondary | ICD-10-CM | POA: Diagnosis not present

## 2016-08-19 DIAGNOSIS — H35412 Lattice degeneration of retina, left eye: Secondary | ICD-10-CM | POA: Diagnosis not present

## 2016-08-19 DIAGNOSIS — H353220 Exudative age-related macular degeneration, left eye, stage unspecified: Secondary | ICD-10-CM | POA: Diagnosis not present

## 2016-08-19 DIAGNOSIS — H43821 Vitreomacular adhesion, right eye: Secondary | ICD-10-CM | POA: Diagnosis not present

## 2016-09-02 ENCOUNTER — Encounter: Payer: Self-pay | Admitting: *Deleted

## 2016-09-16 ENCOUNTER — Encounter: Payer: Self-pay | Admitting: Interventional Cardiology

## 2016-09-16 ENCOUNTER — Ambulatory Visit (INDEPENDENT_AMBULATORY_CARE_PROVIDER_SITE_OTHER): Payer: Medicare Other | Admitting: Interventional Cardiology

## 2016-09-16 VITALS — BP 142/78 | HR 63 | Ht 64.0 in | Wt 170.8 lb

## 2016-09-16 DIAGNOSIS — E785 Hyperlipidemia, unspecified: Secondary | ICD-10-CM

## 2016-09-16 DIAGNOSIS — I359 Nonrheumatic aortic valve disorder, unspecified: Secondary | ICD-10-CM | POA: Diagnosis not present

## 2016-09-16 DIAGNOSIS — I739 Peripheral vascular disease, unspecified: Secondary | ICD-10-CM

## 2016-09-16 DIAGNOSIS — I209 Angina pectoris, unspecified: Secondary | ICD-10-CM

## 2016-09-16 DIAGNOSIS — I25118 Atherosclerotic heart disease of native coronary artery with other forms of angina pectoris: Secondary | ICD-10-CM | POA: Diagnosis not present

## 2016-09-16 DIAGNOSIS — D696 Thrombocytopenia, unspecified: Secondary | ICD-10-CM | POA: Diagnosis not present

## 2016-09-16 DIAGNOSIS — I259 Chronic ischemic heart disease, unspecified: Secondary | ICD-10-CM

## 2016-09-16 DIAGNOSIS — I10 Essential (primary) hypertension: Secondary | ICD-10-CM

## 2016-09-16 NOTE — Addendum Note (Signed)
Addended by: Stephannie Peters on: 09/16/2016 03:16 PM   Modules accepted: Orders

## 2016-09-16 NOTE — Progress Notes (Signed)
Cardiology Office Note    Date:  09/16/2016   ID:  [name], DOB August 02, 1923, MRN [id number]  PCP:  [name], MD  Cardiologist: Sinclair Grooms, MD   Chief Complaint  Patient presents with  . Coronary Artery Disease    History of Present Illness:  [name] is a 83 y.o. male with history of chronic coronary artery disease with overlapping DES and mid RCA 2016, residual LAD disease, hyperlipidemia, chronic diastolic heart failure, and other problems including Harry cell leukemia and chronic anemia.Or recently he has had thrombocytopenia.  He denies angina. He gets dyspneic with climbing a flight of stairs. This is chronic but aggravating with reference to his quality of life. He is concerned about his low platelet count. He denies orthopnea, PND, and lower extremity swelling. His legs get weak if he walks up an incline or upper flight of stairs.  Past Medical History:  Diagnosis Date  . Acute appendicitis   . Anemia   . Anxiety   . Arthritis    "back" (03/14/2014)  . CAD (coronary artery disease)    a. 03/14/14  s/p overlapping DES x2 to mid-distal RCA.  . 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 Colon cancer ([place]) 1984  . Compression fracture of lumbar spine, non-traumatic ([place])   . DJD (degenerative joint disease) of lumbar spine   . Dyslipidemia   . Elevated PSA   . GERD (gastroesophageal reflux disease)   . Glaucoma   . Hairy cell leukemia ([place]) dx'd 1980  . History of blood transfusion    "several; related to hairy cell leukemia & tx "  . History of stomach ulcers 1968  . Hypertension   . Malnutrition ([place])   . Osteoarthritis   . Osteoporosis   . Skin cancer of face     Past Surgical History:  Procedure Laterality Date  . APPENDECTOMY  10/2007  . CATARACT EXTRACTION, BILATERAL Bilateral 02/2008  . COLON SURGERY  1984   "sigmoid; open"  . CORONARY ANGIOPLASTY WITH STENT PLACEMENT  03/14/2014   "2"  . FRACTURE SURGERY    .  LEFT HEART CATHETERIZATION WITH CORONARY ANGIOGRAM N/A 03/14/2014   Procedure: LEFT HEART CATHETERIZATION WITH CORONARY ANGIOGRAM;  Surgeon: Peter M Martinique, MD;  Location: [facility] CATH LAB;  Service: Cardiovascular;  Laterality: N/A;  . LIPOMA EXCISION Right 07/2011   liposarcoma resection; "back"  . MOHS SURGERY Left 02/2011  . MOHS SURGERY  X 3   "all on my face"  . MOLE REMOVAL Left 1985   cheek  . ORIF ANKLE FRACTURE Left 2000  . SHOULDER SURGERY  04/2004  . SPLENECTOMY  1992  . TONSILLECTOMY AND ADENOIDECTOMY  1939    Current Medications: Outpatient Medications Prior to Visit  Medication Sig Dispense Refill  . aspirin 81 MG tablet Take 81 mg by mouth daily.    Marland Kitchen atorvastatin (LIPITOR) 20 MG tablet Take 1 tablet (20 mg total) by mouth daily at 6 PM. 90 tablet 3  . Cholecalciferol (VITAMIN D-3 PO) Take 2,000 Units by mouth daily.     . fluocinonide (LIDEX) 0.05 % external solution APP TOPICALLY TO SCALP AND EARS twice daily as needed FOR DERMATITIS  11  . LATANOPROST OP Apply 1 drop to eye at bedtime. In each eye    . metoprolol tartrate (LOPRESSOR) 25 MG tablet Take 0.5 tablets (12.5 mg total) by mouth daily. 45 tablet 1  . Multiple Vitamins-Minerals (PRESERVISION AREDS 2 PO) Take 1  tablet by mouth daily.    Marland Kitchen NITROSTAT 0.4 MG SL tablet Place 0.4 mg under the tongue every 5 (five) minutes as needed for chest pain.   12  . pantoprazole (PROTONIX) 40 MG tablet Take 1 tablet (40 mg total) by mouth daily. 90 tablet 1  . sertraline (ZOLOFT) 50 MG tablet Take 50 mg by mouth daily.    . tamsulosin (FLOMAX) 0.4 MG CAPS capsule Take 0.8 mg by mouth daily after supper.     . timolol (BETIMOL) 0.5 % ophthalmic solution Place 1 drop into the right eye daily.      No facility-administered medications prior to visit.      Allergies:   Antazoline; Antihistamines, chlorpheniramine-type; and Sulfa antibiotics   Social History   Social History  . Marital status: Married    Spouse name: N/A  .  Number of children: N/A  . Years of education: N/A   Social History Main Topics  . Smoking status: Former Smoker    Packs/day: 0.25    Years: 1.00    Types: Cigarettes, Cigars  . Smokeless tobacco: Never Used     Comment: occasional social smoker during college.  . Alcohol use 5.4 oz/week    2 Glasses of wine, 2 Shots of liquor, 5 Standard drinks or equivalent per week  . Drug use: No  . Sexual activity: No   Other Topics Concern  . None   Social History Narrative  . None     Family History:  The patient's family history includes Congestive Heart Failure (age of onset: 109) in his father; Diabetes Mellitus II in his brother; Heart Problems in his brother; Prostate cancer in his brother; Stroke in his mother.   ROS:   Please see the history of present illness.    Difficulty urinating, difficulty with balance, easy bruising, shortness of breath, vision disturbance, and difficulty with hearing.  All other systems reviewed and are negative.   PHYSICAL EXAM:   VS:  BP (!) 142/78 (BP Location: Right Arm)   Pulse 63   Ht 5\' 4"  (1.626 m)   Wt 170 lb 12.8 oz (77.5 kg)   BMI 29.32 kg/m    GEN: Well nourished, well developed, in no acute distress  HEENT: normal  Neck: no JVD, carotid bruits, or masses Cardiac: RRR; ; there is a 3/6 crescendo decrescendo systolic murmur. No rubs, or gallops,no edema. Pedal pulses are present in the popliteal and posterior tibial bilaterally.  Respiratory:  clear to auscultation bilaterally, normal work of breathing GI: soft, nontender, nondistended, + BS MS: no deformity or atrophy  Skin: warm and dry, no rash Neuro:  Alert and Oriented x 3, Strength and sensation are intact Psych: euthymic mood, full affect  Wt Readings from Last 3 Encounters:  09/16/16 170 lb 12.8 oz (77.5 kg)  07/02/16 170 lb 6.4 oz (77.3 kg)  04/30/16 169 lb 12.8 oz (77 kg)      Studies/Labs Reviewed:   EKG:  EKG  Is not repeated. Most recent tracing from October 2017  revealed right bundle with left axis deviation.  Recent Labs: 12/10/2015: ALT 19; BUN 23; Creat 1.06; Hemoglobin 13.3; Platelets 152; Potassium 3.9; Sodium 141   Lipid Panel    Component Value Date/Time   CHOL 122 (L) 12/10/2015 0949   TRIG 154 (H) 12/10/2015 0949   HDL 33 (L) 12/10/2015 0949   CHOLHDL 3.7 12/10/2015 0949   VLDL 31 (H) 12/10/2015 0949   LDLCALC 58 12/10/2015 0949  Additional studies/ records that were reviewed today include:  Platelet count is 152,009 months ago.    ASSESSMENT:    1. Coronary artery disease of native artery of native heart with stable angina pectoris (B and E)   2. Aortic valve disease   3. Essential hypertension   4. Claudication ([place])   5. Dyslipidemia   6. Thrombocytopenia ([place])      PLAN:  In order of problems listed above:  1. He is doing well without obvious anginal complaints and less shortness of breath on inclines is an anginal equivalent. We will follow. I've encouraged him to continue activity. 2. Mild aortic stenosis one year ago. Slow progression is occurring and perhaps an echo repeated in one year will be appropriate. We discussed an antral history of aortic stenosis and symptoms that might be related. 3. Excellent control. Target 140/90 mmHg or less 4. Exertional leg weakness could be related to his back. We will do a Doppler evaluation to rule out PAD. 5. LDL target less than 70. 6. Platelet count will be obtained today.  Follow-up with me in one year. Truitt Merle in 6 months. Platelet count be obtained today.  Medication Adjustments/Labs and Tests Ordered: Current medicines are reviewed at length with the patient today.  Concerns regarding medicines are outlined above.  Medication changes, Labs and Tests ordered today are listed in the Patient Instructions below. Patient Instructions  Medication Instructions:  None  Labwork: CBC today  Testing/Procedures: Your physician has requested that you have a lower or  upper extremity arterial duplex. This test is an ultrasound of the arteries in the legs or arms. It looks at arterial blood flow in the legs and arms. Allow one hour for Lower and Upper Arterial scans. There are no restrictions or special instructions   Follow-Up: Your physician recommends that you schedule a follow-up appointment in: 6 months with Truitt Merle, NP.  Your physician wants you to follow-up in: 1 year with Dr. Tamala Julian.  You will receive a reminder letter in the mail two months in advance. If you don't receive a letter, please call our office to schedule the follow-up appointment.   Any Other Special Instructions Will Be Listed Below (If Applicable).     If you need a refill on your cardiac medications before your next appointment, please call your pharmacy.      Signed, Sinclair Grooms, MD  09/16/2016 9:11 AM    [facility] [place], [place], [place]  [address] Phone: [phone]; Fax: [phone]

## 2016-09-16 NOTE — Patient Instructions (Signed)
Medication Instructions:  None  Labwork: CBC today  Testing/Procedures: Your physician has requested that you have a lower or upper extremity arterial duplex. This test is an ultrasound of the arteries in the legs or arms. It looks at arterial blood flow in the legs and arms. Allow one hour for Lower and Upper Arterial scans. There are no restrictions or special instructions   Follow-Up: Your physician recommends that you schedule a follow-up appointment in: 6 months with Truitt Merle, NP.  Your physician wants you to follow-up in: 1 year with Dr. Tamala Julian.  You will receive a reminder letter in the mail two months in advance. If you don't receive a letter, please call our office to schedule the follow-up appointment.   Any Other Special Instructions Will Be Listed Below (If Applicable).     If you need a refill on your cardiac medications before your next appointment, please call your pharmacy.

## 2016-09-17 ENCOUNTER — Other Ambulatory Visit: Payer: Self-pay | Admitting: Interventional Cardiology

## 2016-09-17 DIAGNOSIS — I739 Peripheral vascular disease, unspecified: Secondary | ICD-10-CM | POA: Diagnosis not present

## 2016-09-17 DIAGNOSIS — E785 Hyperlipidemia, unspecified: Secondary | ICD-10-CM | POA: Diagnosis not present

## 2016-09-17 DIAGNOSIS — I25118 Atherosclerotic heart disease of native coronary artery with other forms of angina pectoris: Secondary | ICD-10-CM | POA: Diagnosis not present

## 2016-09-17 DIAGNOSIS — I10 Essential (primary) hypertension: Secondary | ICD-10-CM | POA: Diagnosis not present

## 2016-09-17 DIAGNOSIS — I359 Nonrheumatic aortic valve disorder, unspecified: Secondary | ICD-10-CM | POA: Diagnosis not present

## 2016-09-17 DIAGNOSIS — D696 Thrombocytopenia, unspecified: Secondary | ICD-10-CM | POA: Diagnosis not present

## 2016-09-18 ENCOUNTER — Ambulatory Visit (HOSPITAL_COMMUNITY)
Admission: RE | Admit: 2016-09-18 | Discharge: 2016-09-18 | Disposition: A | Discharge status: Home / Self Care | Payer: Medicare Other | Source: Ambulatory Visit | Attending: Cardiology | Admitting: Cardiology

## 2016-09-18 DIAGNOSIS — E785 Hyperlipidemia, unspecified: Secondary | ICD-10-CM | POA: Diagnosis not present

## 2016-09-18 DIAGNOSIS — I10 Essential (primary) hypertension: Secondary | ICD-10-CM | POA: Insufficient documentation

## 2016-09-18 DIAGNOSIS — I739 Peripheral vascular disease, unspecified: Secondary | ICD-10-CM | POA: Diagnosis not present

## 2016-09-18 DIAGNOSIS — D696 Thrombocytopenia, unspecified: Secondary | ICD-10-CM | POA: Diagnosis not present

## 2016-09-18 DIAGNOSIS — I25118 Atherosclerotic heart disease of native coronary artery with other forms of angina pectoris: Secondary | ICD-10-CM | POA: Insufficient documentation

## 2016-09-18 DIAGNOSIS — I359 Nonrheumatic aortic valve disorder, unspecified: Secondary | ICD-10-CM | POA: Insufficient documentation

## 2016-09-18 LAB — CBC WITH DIFFERENTIAL/PLATELET
Basophils Absolute: 0.1 10*3/uL (ref 0.0–0.2)
Basos: 1 %
EOS (ABSOLUTE): 0.2 10*3/uL (ref 0.0–0.4)
Eos: 3 %
Hematocrit: 41.5 % (ref 37.5–51.0)
Hemoglobin: 14.4 g/dL (ref 13.0–17.7)
Immature Grans (Abs): 0.1 10*3/uL (ref 0.0–0.1)
Immature Granulocytes: 1 %
Lymphocytes Absolute: 1.3 10*3/uL (ref 0.7–3.1)
Lymphs: 22 %
MCH: 30.2 pg (ref 26.6–33.0)
MCHC: 34.7 g/dL (ref 31.5–35.7)
MCV: 87 fL (ref 79–97)
Monocytes Absolute: 1.3 10*3/uL — ABNORMAL HIGH (ref 0.1–0.9)
Monocytes: 22 %
Neutrophils Absolute: 3.2 10*3/uL (ref 1.4–7.0)
Neutrophils: 51 %
Platelets: 144 10*3/uL — ABNORMAL LOW (ref 150–379)
RBC: 4.77 x10E6/uL (ref 4.14–5.80)
RDW: 15.3 % (ref 12.3–15.4)
WBC: 6.1 10*3/uL (ref 3.4–10.8)

## 2016-09-23 ENCOUNTER — Ambulatory Visit: Payer: Medicare Other | Admitting: Interventional Cardiology

## 2016-09-23 DIAGNOSIS — L570 Actinic keratosis: Secondary | ICD-10-CM | POA: Diagnosis not present

## 2016-09-23 DIAGNOSIS — L820 Inflamed seborrheic keratosis: Secondary | ICD-10-CM | POA: Diagnosis not present

## 2016-09-23 DIAGNOSIS — H353221 Exudative age-related macular degeneration, left eye, with active choroidal neovascularization: Secondary | ICD-10-CM | POA: Diagnosis not present

## 2016-09-23 DIAGNOSIS — H353111 Nonexudative age-related macular degeneration, right eye, early dry stage: Secondary | ICD-10-CM | POA: Diagnosis not present

## 2016-09-23 DIAGNOSIS — L219 Seborrheic dermatitis, unspecified: Secondary | ICD-10-CM | POA: Diagnosis not present

## 2016-09-23 DIAGNOSIS — Z85828 Personal history of other malignant neoplasm of skin: Secondary | ICD-10-CM | POA: Diagnosis not present

## 2016-09-23 DIAGNOSIS — H35373 Puckering of macula, bilateral: Secondary | ICD-10-CM | POA: Diagnosis not present

## 2016-09-23 DIAGNOSIS — H35412 Lattice degeneration of retina, left eye: Secondary | ICD-10-CM | POA: Diagnosis not present

## 2016-10-23 DIAGNOSIS — H35373 Puckering of macula, bilateral: Secondary | ICD-10-CM | POA: Diagnosis not present

## 2016-10-23 DIAGNOSIS — H353111 Nonexudative age-related macular degeneration, right eye, early dry stage: Secondary | ICD-10-CM | POA: Diagnosis not present

## 2016-10-23 DIAGNOSIS — H35412 Lattice degeneration of retina, left eye: Secondary | ICD-10-CM | POA: Diagnosis not present

## 2016-10-23 DIAGNOSIS — H353221 Exudative age-related macular degeneration, left eye, with active choroidal neovascularization: Secondary | ICD-10-CM | POA: Diagnosis not present

## 2016-10-28 ENCOUNTER — Other Ambulatory Visit: Payer: Self-pay | Admitting: Nurse Practitioner

## 2016-10-30 DIAGNOSIS — M25512 Pain in left shoulder: Secondary | ICD-10-CM | POA: Diagnosis not present

## 2016-11-25 DIAGNOSIS — H353221 Exudative age-related macular degeneration, left eye, with active choroidal neovascularization: Secondary | ICD-10-CM | POA: Diagnosis not present

## 2016-11-25 DIAGNOSIS — H353111 Nonexudative age-related macular degeneration, right eye, early dry stage: Secondary | ICD-10-CM | POA: Diagnosis not present

## 2016-11-25 DIAGNOSIS — H35372 Puckering of macula, left eye: Secondary | ICD-10-CM | POA: Diagnosis not present

## 2016-11-25 DIAGNOSIS — H43821 Vitreomacular adhesion, right eye: Secondary | ICD-10-CM | POA: Diagnosis not present

## 2016-11-25 DIAGNOSIS — H35412 Lattice degeneration of retina, left eye: Secondary | ICD-10-CM | POA: Diagnosis not present

## 2016-11-26 DIAGNOSIS — Z961 Presence of intraocular lens: Secondary | ICD-10-CM | POA: Diagnosis not present

## 2016-11-26 DIAGNOSIS — H52203 Unspecified astigmatism, bilateral: Secondary | ICD-10-CM | POA: Diagnosis not present

## 2016-11-26 DIAGNOSIS — H401431 Capsular glaucoma with pseudoexfoliation of lens, bilateral, mild stage: Secondary | ICD-10-CM | POA: Diagnosis not present

## 2016-11-29 DIAGNOSIS — Z23 Encounter for immunization: Secondary | ICD-10-CM | POA: Diagnosis not present

## 2016-12-23 DIAGNOSIS — H43821 Vitreomacular adhesion, right eye: Secondary | ICD-10-CM | POA: Diagnosis not present

## 2016-12-23 DIAGNOSIS — H35412 Lattice degeneration of retina, left eye: Secondary | ICD-10-CM | POA: Diagnosis not present

## 2016-12-23 DIAGNOSIS — H353111 Nonexudative age-related macular degeneration, right eye, early dry stage: Secondary | ICD-10-CM | POA: Diagnosis not present

## 2016-12-23 DIAGNOSIS — H35372 Puckering of macula, left eye: Secondary | ICD-10-CM | POA: Diagnosis not present

## 2016-12-23 DIAGNOSIS — H353221 Exudative age-related macular degeneration, left eye, with active choroidal neovascularization: Secondary | ICD-10-CM | POA: Diagnosis not present

## 2017-01-20 DIAGNOSIS — H43821 Vitreomacular adhesion, right eye: Secondary | ICD-10-CM | POA: Diagnosis not present

## 2017-01-20 DIAGNOSIS — H35412 Lattice degeneration of retina, left eye: Secondary | ICD-10-CM | POA: Diagnosis not present

## 2017-01-20 DIAGNOSIS — H353221 Exudative age-related macular degeneration, left eye, with active choroidal neovascularization: Secondary | ICD-10-CM | POA: Diagnosis not present

## 2017-01-20 DIAGNOSIS — H353111 Nonexudative age-related macular degeneration, right eye, early dry stage: Secondary | ICD-10-CM | POA: Diagnosis not present

## 2017-01-26 ENCOUNTER — Other Ambulatory Visit: Payer: Self-pay | Admitting: Nurse Practitioner

## 2017-01-29 DIAGNOSIS — E7849 Other hyperlipidemia: Secondary | ICD-10-CM | POA: Diagnosis not present

## 2017-01-29 DIAGNOSIS — Z125 Encounter for screening for malignant neoplasm of prostate: Secondary | ICD-10-CM | POA: Diagnosis not present

## 2017-01-29 DIAGNOSIS — I10 Essential (primary) hypertension: Secondary | ICD-10-CM | POA: Diagnosis not present

## 2017-02-05 DIAGNOSIS — R030 Elevated blood-pressure reading, without diagnosis of hypertension: Secondary | ICD-10-CM | POA: Diagnosis not present

## 2017-02-05 DIAGNOSIS — Z1389 Encounter for screening for other disorder: Secondary | ICD-10-CM | POA: Diagnosis not present

## 2017-02-05 DIAGNOSIS — Z6828 Body mass index (BMI) 28.0-28.9, adult: Secondary | ICD-10-CM | POA: Diagnosis not present

## 2017-02-05 DIAGNOSIS — R82998 Other abnormal findings in urine: Secondary | ICD-10-CM | POA: Diagnosis not present

## 2017-02-05 DIAGNOSIS — I2510 Atherosclerotic heart disease of native coronary artery without angina pectoris: Secondary | ICD-10-CM | POA: Diagnosis not present

## 2017-02-05 DIAGNOSIS — R972 Elevated prostate specific antigen [PSA]: Secondary | ICD-10-CM | POA: Diagnosis not present

## 2017-02-05 DIAGNOSIS — E7849 Other hyperlipidemia: Secondary | ICD-10-CM | POA: Diagnosis not present

## 2017-02-05 DIAGNOSIS — R2681 Unsteadiness on feet: Secondary | ICD-10-CM | POA: Diagnosis not present

## 2017-02-05 DIAGNOSIS — I10 Essential (primary) hypertension: Secondary | ICD-10-CM | POA: Diagnosis not present

## 2017-02-05 DIAGNOSIS — F329 Major depressive disorder, single episode, unspecified: Secondary | ICD-10-CM | POA: Diagnosis not present

## 2017-02-05 DIAGNOSIS — H9193 Unspecified hearing loss, bilateral: Secondary | ICD-10-CM | POA: Diagnosis not present

## 2017-02-05 DIAGNOSIS — Z0000 Encounter for general adult medical examination without abnormal findings: Secondary | ICD-10-CM | POA: Diagnosis not present

## 2017-02-17 DIAGNOSIS — H353221 Exudative age-related macular degeneration, left eye, with active choroidal neovascularization: Secondary | ICD-10-CM | POA: Diagnosis not present

## 2017-02-17 DIAGNOSIS — H353111 Nonexudative age-related macular degeneration, right eye, early dry stage: Secondary | ICD-10-CM | POA: Diagnosis not present

## 2017-02-17 DIAGNOSIS — H43821 Vitreomacular adhesion, right eye: Secondary | ICD-10-CM | POA: Diagnosis not present

## 2017-02-17 DIAGNOSIS — H35412 Lattice degeneration of retina, left eye: Secondary | ICD-10-CM | POA: Diagnosis not present

## 2017-02-19 DIAGNOSIS — R05 Cough: Secondary | ICD-10-CM | POA: Diagnosis not present

## 2017-02-19 DIAGNOSIS — I2510 Atherosclerotic heart disease of native coronary artery without angina pectoris: Secondary | ICD-10-CM | POA: Diagnosis not present

## 2017-02-19 DIAGNOSIS — R972 Elevated prostate specific antigen [PSA]: Secondary | ICD-10-CM | POA: Diagnosis not present

## 2017-02-19 DIAGNOSIS — Z6828 Body mass index (BMI) 28.0-28.9, adult: Secondary | ICD-10-CM | POA: Diagnosis not present

## 2017-02-19 DIAGNOSIS — E7849 Other hyperlipidemia: Secondary | ICD-10-CM | POA: Diagnosis not present

## 2017-02-19 DIAGNOSIS — I10 Essential (primary) hypertension: Secondary | ICD-10-CM | POA: Diagnosis not present

## 2017-03-10 DIAGNOSIS — R972 Elevated prostate specific antigen [PSA]: Secondary | ICD-10-CM | POA: Diagnosis not present

## 2017-03-17 DIAGNOSIS — H35412 Lattice degeneration of retina, left eye: Secondary | ICD-10-CM | POA: Diagnosis not present

## 2017-03-17 DIAGNOSIS — H35373 Puckering of macula, bilateral: Secondary | ICD-10-CM | POA: Diagnosis not present

## 2017-03-17 DIAGNOSIS — H353220 Exudative age-related macular degeneration, left eye, stage unspecified: Secondary | ICD-10-CM | POA: Diagnosis not present

## 2017-03-17 DIAGNOSIS — H353111 Nonexudative age-related macular degeneration, right eye, early dry stage: Secondary | ICD-10-CM | POA: Diagnosis not present

## 2017-03-17 DIAGNOSIS — Z961 Presence of intraocular lens: Secondary | ICD-10-CM | POA: Diagnosis not present

## 2017-03-23 ENCOUNTER — Encounter: Payer: Self-pay | Admitting: Nurse Practitioner

## 2017-03-23 ENCOUNTER — Ambulatory Visit (INDEPENDENT_AMBULATORY_CARE_PROVIDER_SITE_OTHER): Payer: Medicare Other | Admitting: Nurse Practitioner

## 2017-03-23 VITALS — BP 138/74 | HR 64 | Ht 64.0 in | Wt 167.8 lb

## 2017-03-23 DIAGNOSIS — D696 Thrombocytopenia, unspecified: Secondary | ICD-10-CM | POA: Diagnosis not present

## 2017-03-23 DIAGNOSIS — I259 Chronic ischemic heart disease, unspecified: Secondary | ICD-10-CM

## 2017-03-23 DIAGNOSIS — I10 Essential (primary) hypertension: Secondary | ICD-10-CM

## 2017-03-23 NOTE — Patient Instructions (Addendum)
We will be checking the following labs today - NONE  Medication Instructions:    Continue with your current medicines.     Testing/Procedures To Be Arranged:  N/A  Follow-Up:   See Dr. Smith in 6 months.     Other Special Instructions:   N/A    If you need a refill on your cardiac medications before your next appointment, please call your pharmacy.   Call the [facility] office at [phone] if you have any questions, problems or concerns.      

## 2017-03-23 NOTE — Progress Notes (Signed)
CARDIOLOGY OFFICE NOTE  Date:  03/23/2017    [name] Date of Birth: 1923/12/15 Medical Record #[id number]  PCP:  [name], MD  Cardiologist:  Jennings Books    Chief Complaint  Patient presents with  . Coronary Artery Disease    6 month check - seen for Dr. Tamala Julian    History of Present Illness: [name] is a 84 y.o. male who presents today for a 6 month check. He is former patient of Dr. Sherryl Barters - now following with me and Dr. Tamala Julian.   He has a history of known CAD. Back in January of 2016 he presented for planned coronary angiography after an abnormal outpatient nuclear stress test. Had PCI with DES x 2 to the mid RCA due to subtotal occlusion. Has normal EF. On DAPT for one year. Noted to have residual disease in the LAD which will be managed medically. Low dose beta blocker and Imdur added as well. 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 He has had chronically elevated PSA's.   Over the last year or so he has had issues with worsening thrombocytopenia - Plavix stopped. Patient was convinced that his lower count was due to Imdur and he wanted to stop.   Last seen in July - lower extremity doppler was obtained due to concern for PAD - this was normal. Cardiac status was felt to be ok. No chest pain.  Comes back today. Here alone today. He feels like he is doing well. Had a cold last month - now resolved. No chest pain. Breathing is at his baseline. Still goes to his office every day. He notes he is sleeping more but staying up later at night. He goes to the club to exercise on Saturday and Sundays. No more driving at night. Seeing oncology/hematology later this month about his counts. No bleeding. Overall, seems to be doing ok.    Past Medical History:  Diagnosis Date  . Acute  appendicitis   . Anemia   . Anxiety   . Arthritis    "back" (03/14/2014)  . CAD (coronary artery disease)    a. 03/14/14  s/p overlapping DES x2 to mid-distal RCA.  . 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 Colon cancer ([place]) 1984  . Compression fracture of lumbar spine, non-traumatic ([place])   . DJD (degenerative joint disease) of lumbar spine   . Dyslipidemia   . Elevated PSA   . GERD (gastroesophageal reflux disease)   . Glaucoma   . Hairy cell leukemia ([place]) dx'd 1980  . History of blood transfusion    "several; related to hairy cell leukemia & tx "  . History of stomach ulcers 1968  . Hypertension   . Malnutrition ([place])   . Osteoarthritis   . Osteoporosis   . Skin cancer of face     Past Surgical History:  Procedure Laterality Date  . APPENDECTOMY  10/2007  . CATARACT EXTRACTION, BILATERAL Bilateral 02/2008  . COLON SURGERY  1984   "sigmoid; open"  . CORONARY ANGIOPLASTY WITH STENT PLACEMENT  03/14/2014   "2"  . FRACTURE SURGERY    . LEFT HEART CATHETERIZATION WITH CORONARY ANGIOGRAM N/A 03/14/2014   Procedure: LEFT HEART CATHETERIZATION WITH CORONARY ANGIOGRAM;  Surgeon: Peter M Martinique, MD;  Location: [facility] CATH LAB;  Service: Cardiovascular;  Laterality: N/A;  . LIPOMA EXCISION Right 07/2011   liposarcoma resection; "back"  . MOHS SURGERY Left 02/2011  . MOHS SURGERY  X 3   "all on my face"  . MOLE REMOVAL Left 1985   cheek  . ORIF ANKLE FRACTURE Left 2000  . SHOULDER SURGERY  04/2004  . SPLENECTOMY  1992  . TONSILLECTOMY AND ADENOIDECTOMY  1939     Medications: Current Meds  Medication Sig  . aspirin 81 MG tablet Take 81 mg by mouth daily.  Marland Kitchen atorvastatin (LIPITOR) 20 MG tablet TAKE 1 TABLET BY MOUTH DAILY AT 6PM  . Cholecalciferol (VITAMIN D-3 PO) Take 2,000 Units by mouth daily.   . fluocinonide (LIDEX) 0.05 % external solution APP TOPICALLY TO SCALP AND EARS twice daily as needed FOR DERMATITIS  . LATANOPROST OP Apply 1 drop to eye at  bedtime. In each eye  . metoprolol tartrate (LOPRESSOR) 25 MG tablet Take 0.5 tablets (12.5 mg total) by mouth daily.  . Multiple Vitamins-Minerals (PRESERVISION AREDS 2 PO) Take 1 tablet by mouth daily.  Marland Kitchen NITROSTAT 0.4 MG SL tablet Place 0.4 mg under the tongue every 5 (five) minutes as needed for chest pain.   . pantoprazole (PROTONIX) 40 MG tablet TAKE 1 TABLET BY MOUTH DAILY  . sertraline (ZOLOFT) 50 MG tablet Take 50 mg by mouth daily.  . tamsulosin (FLOMAX) 0.4 MG CAPS capsule Take 0.8 mg by mouth daily after supper.   . timolol (BETIMOL) 0.5 % ophthalmic solution Place 1 drop into the right eye daily.      Allergies: Allergies  Allergen Reactions  . Antazoline Anaphylaxis  . Antihistamines, Chlorpheniramine-Type     Nervous, jittery  . Sulfa Antibiotics Rash    Social History: The patient  reports that he has quit smoking. His smoking use included cigarettes and cigars. He has a 0.25 pack-year smoking history. he has never used smokeless tobacco. He reports that he drinks about 5.4 oz of alcohol per week. He reports that he does not use drugs.   Family History: The patient's family history includes Congestive Heart Failure (age of onset: 74) in his father; Diabetes Mellitus II in his brother; Heart Problems in his brother; Prostate cancer in his brother; Stroke in his mother.   Review of Systems: Please see the history of present illness.   Otherwise, the review of systems is positive for none.   All other systems are reviewed and negative.   Physical Exam: VS:  BP 138/74 (BP Location: Left Arm, Patient Position: Sitting, Cuff Size: Normal)   Pulse 64   Ht 5\' 4"  (1.626 m)   Wt 167 lb 12.8 oz (76.1 kg)   SpO2 97% Comment: at rest  BMI 28.80 kg/m  .  BMI Body mass index is 28.8 kg/m.  Wt Readings from Last 3 Encounters:  03/23/17 167 lb 12.8 oz (76.1 kg)  09/16/16 170 lb 12.8 oz (77.5 kg)  07/02/16 170 lb 6.4 oz (77.3 kg)    General: Elderly. Alert and in no acute  distress.   HEENT: Normal.  Neck: Supple, no JVD, carotid bruits, or masses noted.  Cardiac: Regular rate and rhythm. Outflow murmur noted. No edema.  Respiratory:  Lungs are clear to auscultation bilaterally with normal work of breathing.  GI: Soft and nontender.  MS: No deformity or atrophy. Gait and ROM intact.  Skin: Warm and dry. Color is normal.  Neuro:  Strength and sensation are intact and no gross focal deficits noted.  Psych: Alert, appropriate and with normal affect.   LABORATORY DATA:  EKG:  EKG is not ordered today.  Lab Results  Component Value Date   WBC 6.1 09/17/2016   HGB 14.4 09/17/2016   HCT 41.5 09/17/2016   PLT 144 (L) 09/17/2016   GLUCOSE 85 12/10/2015   CHOL 122 (L) 12/10/2015   TRIG 154 (H) 12/10/2015   HDL 33 (L) 12/10/2015   LDLCALC 58 12/10/2015   ALT 19 12/10/2015   AST 22 12/10/2015   NA 141 12/10/2015   K 3.9 12/10/2015   CL 106 12/10/2015   CREATININE 1.06 12/10/2015   BUN 23 12/10/2015   CO2 25 12/10/2015   TSH 2.16 06/22/2014   INR 1.0 03/10/2014       BNP (last 3 results) No results for input(s): BNP in the last 8760 hours.  ProBNP (last 3 results) No results for input(s): PROBNP in the last 8760 hours.   Other Studies Reviewed Today:  Echo Study Conclusions from 06/2015  - Left ventricle: The cavity size was normal. Wall thickness was normal. Systolic function was normal. The estimated ejection fraction was in the range of 55% to 60%. Wall motion was normal; there were no regional wall motion abnormalities. Doppler parameters are consistent with abnormal left ventricular relaxation (grade 1 diastolic dysfunction). Doppler parameters are consistent with high ventricular filling pressure. - Aortic valve: Valve mobility was restricted. There was mild stenosis. There was mild regurgitation. Valve area (VTI): 1.65 cm^2. Valve area (Vmax): 1.43 cm^2. Valve area (Vmean): 1.53 cm^2. - Aortic root: The aortic  root was mildly dilated. - Ascending aorta: The ascending aorta was mildly dilated. - Left atrium: The atrium was mildly dilated. - Atrial septum: There was increased thickness of the septum, consistent with lipomatous hypertrophy.  Impressions:  - Normal LV function; grade 1 diastolic dysfunction; mild LAE; calcified aortic valve with mild AS (mean gradient 14 mmHg); mild AI; trace MR; oscillating density in LV likely calcified, redundant MV chordae; prominent lipomatous hypertrophy of atrial septum.  Coronary angiography 03/2014:  Left mainstem:Moderately calcified without significant disease.   Left anterior descending (LAD):The LAD has moderate calcification in the proximal and mid vessel. Immediately after the second diagonal there is a focal 95% stenosis. Following the third diagonal the LAD is occluded. It fills by left to left collaterals.   [place] intermediate: this is a very large branch with 30% stenosis in the proximal vessel.   Left circumflex (LCx):this is a relatively small branch without significant disease.   Right coronary artery (RCA):The RCA is small and diffusely diseased. The mid vessel is calcified with a 99% stenosis and TIMI 1 flow. The distal RCA branches fill by left to right collaterals.   Left ventriculography: Left ventricular systolic function is normal, LVEF is estimated at 65%, there is no significant mitral regurgitation   PCI Note: Following the diagnostic procedure, the decision was made to proceed with PCIof the RCA. Weight-based bivalirudin was given for anticoagulation.Plavix 600 mg was given orally. Once a therapeutic ACT was achieved, a 6 [place] FR4guide catheter was inserted. A prowatercoronary guidewire was used to cross the lesion. The lesion was predilated with a 2.0 mmballoon. After better perfusion was improved there was a lesion noted in the distal RCA as well. Thislesion was then stented with a 2.25 x 16 mm  Promusstent. The stent was postdilated with a 2.5 mmnoncompliant balloon. The mid vessel lesion was then stented with a 2.5 x 20 mm Promus stent. This was post  dilated with a 2.5 mm noncompliant balloon. Following PCI, there was 0% residual stenosis and TIMI-3 flow. Final angiography confirmed an excellent result. The patient tolerated the procedure well. There were no immediate procedural complications. A TR band was used for radial hemostasis. The patient was transferred to the post catheterization recovery area for further monitoring.  PCI Data: Vessel - RCA/Segment - mid and distal Percent Stenosis (pre) 99% and 80% TIMI-flow 1 Stent 2.5 x 20 mm Promus and 2.25 x 16 mm Promus respectively Percent Stenosis (post) 0% TIMI-flow (post) 3  Final Conclusions:  1. Severe 2 vessel obstructive CAD. The patient has chronic total occlusion of the distal LAD with collaterals. There is a severe lesion in the LAD after the second diagonal but this lesion supplies only a small third diagonal branch. The mid RCA had a subtotalocclusion. 2. Normal LV function.  Recommendations:  Continue DAPT for one year. We will treat residual disease in LAD medically. Will add low dose metoprolol and isosorbide. Anticipate DC in am.   Peter Martinique, Portal 03/14/2014, 12:34 PM   Assessment/Plan:  1. CAD - with prior cath from 03/2014 with PCI to the mid RCA - he was on DAPT for over 2 years. Normal LV function. Residual LAD disease to be treated medically. Plavix stopped back in February of 2018 due to falling platelet count and easy bleeding/bruising. He is no longer on Imdur due to his request. He has no symptoms of angina. He is doing well clinically - no changes made today.   2. Thrombocytopenia - followed by oncology/hematology - no active bleeding noted.   3. HTN - BP looks fine on his current regimen.   4. HLD - labs from November noted - remains on statin therapy.   5. Aortic stenosis -  echo from April of 2017 is stable. He has no cardinal symptoms. Would follow for now. I would hold off on repeat echo at this time.   6. History of prior cancers/hairy cell leukemia followed at [facility].   Current medicines are reviewed with the patient today.  The patient does not have concerns regarding medicines other than what has been noted above.  The following changes have been made:  See above.  Labs/ tests ordered today include:   No orders of the defined types were placed in this encounter.    Disposition:   FU with Dr. Tamala Julian in 6 months.   Patient is agreeable to this plan and will call if any problems develop in the interim.   SignedTruitt Merle, NP  03/23/2017 8:51 AM  [facility] [address] [place], [place]  [address] Phone: [phone] Fax: [phone]

## 2017-03-31 DIAGNOSIS — C9140 Hairy cell leukemia not having achieved remission: Secondary | ICD-10-CM | POA: Diagnosis not present

## 2017-03-31 DIAGNOSIS — H608X3 Other otitis externa, bilateral: Secondary | ICD-10-CM | POA: Diagnosis not present

## 2017-03-31 DIAGNOSIS — C9141 Hairy cell leukemia, in remission: Secondary | ICD-10-CM | POA: Diagnosis not present

## 2017-03-31 DIAGNOSIS — L308 Other specified dermatitis: Secondary | ICD-10-CM | POA: Diagnosis not present

## 2017-03-31 DIAGNOSIS — Z85828 Personal history of other malignant neoplasm of skin: Secondary | ICD-10-CM | POA: Diagnosis not present

## 2017-03-31 DIAGNOSIS — H903 Sensorineural hearing loss, bilateral: Secondary | ICD-10-CM | POA: Diagnosis not present

## 2017-03-31 DIAGNOSIS — H60333 Swimmer's ear, bilateral: Secondary | ICD-10-CM | POA: Diagnosis not present

## 2017-03-31 DIAGNOSIS — L570 Actinic keratosis: Secondary | ICD-10-CM | POA: Diagnosis not present

## 2017-03-31 DIAGNOSIS — L219 Seborrheic dermatitis, unspecified: Secondary | ICD-10-CM | POA: Diagnosis not present

## 2017-04-07 DIAGNOSIS — H60333 Swimmer's ear, bilateral: Secondary | ICD-10-CM | POA: Diagnosis not present

## 2017-04-07 DIAGNOSIS — H608X3 Other otitis externa, bilateral: Secondary | ICD-10-CM | POA: Diagnosis not present

## 2017-04-07 DIAGNOSIS — H903 Sensorineural hearing loss, bilateral: Secondary | ICD-10-CM | POA: Diagnosis not present

## 2017-04-16 DIAGNOSIS — H43821 Vitreomacular adhesion, right eye: Secondary | ICD-10-CM | POA: Diagnosis not present

## 2017-04-16 DIAGNOSIS — H353111 Nonexudative age-related macular degeneration, right eye, early dry stage: Secondary | ICD-10-CM | POA: Diagnosis not present

## 2017-04-16 DIAGNOSIS — H353221 Exudative age-related macular degeneration, left eye, with active choroidal neovascularization: Secondary | ICD-10-CM | POA: Diagnosis not present

## 2017-04-16 DIAGNOSIS — H35372 Puckering of macula, left eye: Secondary | ICD-10-CM | POA: Diagnosis not present

## 2017-04-16 DIAGNOSIS — H35412 Lattice degeneration of retina, left eye: Secondary | ICD-10-CM | POA: Diagnosis not present

## 2017-04-21 DIAGNOSIS — H60333 Swimmer's ear, bilateral: Secondary | ICD-10-CM | POA: Diagnosis not present

## 2017-04-21 DIAGNOSIS — H903 Sensorineural hearing loss, bilateral: Secondary | ICD-10-CM | POA: Diagnosis not present

## 2017-04-21 DIAGNOSIS — H608X3 Other otitis externa, bilateral: Secondary | ICD-10-CM | POA: Diagnosis not present

## 2017-04-23 ENCOUNTER — Other Ambulatory Visit: Payer: Self-pay | Admitting: Nurse Practitioner

## 2017-05-14 DIAGNOSIS — H353111 Nonexudative age-related macular degeneration, right eye, early dry stage: Secondary | ICD-10-CM | POA: Diagnosis not present

## 2017-05-14 DIAGNOSIS — H35412 Lattice degeneration of retina, left eye: Secondary | ICD-10-CM | POA: Diagnosis not present

## 2017-05-14 DIAGNOSIS — H353221 Exudative age-related macular degeneration, left eye, with active choroidal neovascularization: Secondary | ICD-10-CM | POA: Diagnosis not present

## 2017-05-14 DIAGNOSIS — H35371 Puckering of macula, right eye: Secondary | ICD-10-CM | POA: Diagnosis not present

## 2017-06-02 DIAGNOSIS — M549 Dorsalgia, unspecified: Secondary | ICD-10-CM | POA: Diagnosis not present

## 2017-06-02 DIAGNOSIS — M5136 Other intervertebral disc degeneration, lumbar region: Secondary | ICD-10-CM | POA: Diagnosis not present

## 2017-06-02 DIAGNOSIS — Z6829 Body mass index (BMI) 29.0-29.9, adult: Secondary | ICD-10-CM | POA: Diagnosis not present

## 2017-06-02 DIAGNOSIS — M47816 Spondylosis without myelopathy or radiculopathy, lumbar region: Secondary | ICD-10-CM | POA: Diagnosis not present

## 2017-06-02 DIAGNOSIS — M546 Pain in thoracic spine: Secondary | ICD-10-CM | POA: Diagnosis not present

## 2017-06-02 DIAGNOSIS — I10 Essential (primary) hypertension: Secondary | ICD-10-CM | POA: Diagnosis not present

## 2017-06-02 DIAGNOSIS — M545 Low back pain: Secondary | ICD-10-CM | POA: Diagnosis not present

## 2017-06-09 DIAGNOSIS — H35372 Puckering of macula, left eye: Secondary | ICD-10-CM | POA: Diagnosis not present

## 2017-06-09 DIAGNOSIS — H35412 Lattice degeneration of retina, left eye: Secondary | ICD-10-CM | POA: Diagnosis not present

## 2017-06-09 DIAGNOSIS — H43821 Vitreomacular adhesion, right eye: Secondary | ICD-10-CM | POA: Diagnosis not present

## 2017-06-09 DIAGNOSIS — H353221 Exudative age-related macular degeneration, left eye, with active choroidal neovascularization: Secondary | ICD-10-CM | POA: Diagnosis not present

## 2017-06-09 DIAGNOSIS — Z961 Presence of intraocular lens: Secondary | ICD-10-CM | POA: Diagnosis not present

## 2017-06-18 DIAGNOSIS — Z85831 Personal history of malignant neoplasm of soft tissue: Secondary | ICD-10-CM | POA: Diagnosis not present

## 2017-06-18 DIAGNOSIS — Z08 Encounter for follow-up examination after completed treatment for malignant neoplasm: Secondary | ICD-10-CM | POA: Diagnosis not present

## 2017-06-18 DIAGNOSIS — C493 Malignant neoplasm of connective and soft tissue of thorax: Secondary | ICD-10-CM | POA: Diagnosis not present

## 2017-06-18 DIAGNOSIS — R918 Other nonspecific abnormal finding of lung field: Secondary | ICD-10-CM | POA: Diagnosis not present

## 2017-06-18 DIAGNOSIS — Z856 Personal history of leukemia: Secondary | ICD-10-CM | POA: Diagnosis not present

## 2017-06-18 DIAGNOSIS — C499 Malignant neoplasm of connective and soft tissue, unspecified: Secondary | ICD-10-CM | POA: Diagnosis not present

## 2017-07-07 DIAGNOSIS — H353111 Nonexudative age-related macular degeneration, right eye, early dry stage: Secondary | ICD-10-CM | POA: Diagnosis not present

## 2017-07-07 DIAGNOSIS — H35412 Lattice degeneration of retina, left eye: Secondary | ICD-10-CM | POA: Diagnosis not present

## 2017-07-07 DIAGNOSIS — H353211 Exudative age-related macular degeneration, right eye, with active choroidal neovascularization: Secondary | ICD-10-CM | POA: Diagnosis not present

## 2017-07-07 DIAGNOSIS — H43821 Vitreomacular adhesion, right eye: Secondary | ICD-10-CM | POA: Diagnosis not present

## 2017-07-07 DIAGNOSIS — H35373 Puckering of macula, bilateral: Secondary | ICD-10-CM | POA: Diagnosis not present

## 2017-07-07 DIAGNOSIS — H353221 Exudative age-related macular degeneration, left eye, with active choroidal neovascularization: Secondary | ICD-10-CM | POA: Diagnosis not present

## 2017-07-24 DIAGNOSIS — S81812A Laceration without foreign body, left lower leg, initial encounter: Secondary | ICD-10-CM | POA: Diagnosis not present

## 2017-07-24 DIAGNOSIS — Z760 Encounter for issue of repeat prescription: Secondary | ICD-10-CM | POA: Diagnosis not present

## 2017-07-24 DIAGNOSIS — Z955 Presence of coronary angioplasty implant and graft: Secondary | ICD-10-CM | POA: Diagnosis not present

## 2017-07-24 DIAGNOSIS — S81012A Laceration without foreign body, left knee, initial encounter: Secondary | ICD-10-CM | POA: Diagnosis not present

## 2017-08-03 DIAGNOSIS — D696 Thrombocytopenia, unspecified: Secondary | ICD-10-CM | POA: Diagnosis not present

## 2017-08-03 DIAGNOSIS — S81802D Unspecified open wound, left lower leg, subsequent encounter: Secondary | ICD-10-CM | POA: Diagnosis not present

## 2017-08-03 DIAGNOSIS — Z6830 Body mass index (BMI) 30.0-30.9, adult: Secondary | ICD-10-CM | POA: Diagnosis not present

## 2017-08-05 DIAGNOSIS — H43821 Vitreomacular adhesion, right eye: Secondary | ICD-10-CM | POA: Diagnosis not present

## 2017-08-05 DIAGNOSIS — H353111 Nonexudative age-related macular degeneration, right eye, early dry stage: Secondary | ICD-10-CM | POA: Diagnosis not present

## 2017-08-05 DIAGNOSIS — H353221 Exudative age-related macular degeneration, left eye, with active choroidal neovascularization: Secondary | ICD-10-CM | POA: Diagnosis not present

## 2017-08-05 DIAGNOSIS — H35373 Puckering of macula, bilateral: Secondary | ICD-10-CM | POA: Diagnosis not present

## 2017-08-05 DIAGNOSIS — H35341 Macular cyst, hole, or pseudohole, right eye: Secondary | ICD-10-CM | POA: Diagnosis not present

## 2017-08-14 ENCOUNTER — Other Ambulatory Visit: Payer: Self-pay | Admitting: Nurse Practitioner

## 2017-08-20 DIAGNOSIS — L089 Local infection of the skin and subcutaneous tissue, unspecified: Secondary | ICD-10-CM | POA: Diagnosis not present

## 2017-08-20 DIAGNOSIS — S81802D Unspecified open wound, left lower leg, subsequent encounter: Secondary | ICD-10-CM | POA: Diagnosis not present

## 2017-08-26 DIAGNOSIS — H353121 Nonexudative age-related macular degeneration, left eye, early dry stage: Secondary | ICD-10-CM | POA: Diagnosis not present

## 2017-08-26 DIAGNOSIS — H353211 Exudative age-related macular degeneration, right eye, with active choroidal neovascularization: Secondary | ICD-10-CM | POA: Diagnosis not present

## 2017-08-26 DIAGNOSIS — H35412 Lattice degeneration of retina, left eye: Secondary | ICD-10-CM | POA: Diagnosis not present

## 2017-08-26 DIAGNOSIS — H35372 Puckering of macula, left eye: Secondary | ICD-10-CM | POA: Diagnosis not present

## 2017-08-26 DIAGNOSIS — H43821 Vitreomacular adhesion, right eye: Secondary | ICD-10-CM | POA: Diagnosis not present

## 2017-08-26 DIAGNOSIS — Z961 Presence of intraocular lens: Secondary | ICD-10-CM | POA: Diagnosis not present

## 2017-08-26 DIAGNOSIS — S81802D Unspecified open wound, left lower leg, subsequent encounter: Secondary | ICD-10-CM | POA: Diagnosis not present

## 2017-09-09 DIAGNOSIS — S81801S Unspecified open wound, right lower leg, sequela: Secondary | ICD-10-CM | POA: Diagnosis not present

## 2017-09-09 DIAGNOSIS — Z6829 Body mass index (BMI) 29.0-29.9, adult: Secondary | ICD-10-CM | POA: Diagnosis not present

## 2017-09-29 DIAGNOSIS — Z85828 Personal history of other malignant neoplasm of skin: Secondary | ICD-10-CM | POA: Diagnosis not present

## 2017-09-29 DIAGNOSIS — H35373 Puckering of macula, bilateral: Secondary | ICD-10-CM | POA: Diagnosis not present

## 2017-09-29 DIAGNOSIS — H43821 Vitreomacular adhesion, right eye: Secondary | ICD-10-CM | POA: Diagnosis not present

## 2017-09-29 DIAGNOSIS — H353221 Exudative age-related macular degeneration, left eye, with active choroidal neovascularization: Secondary | ICD-10-CM | POA: Diagnosis not present

## 2017-09-29 DIAGNOSIS — L570 Actinic keratosis: Secondary | ICD-10-CM | POA: Diagnosis not present

## 2017-09-29 DIAGNOSIS — C9140 Hairy cell leukemia not having achieved remission: Secondary | ICD-10-CM | POA: Diagnosis not present

## 2017-09-29 DIAGNOSIS — L308 Other specified dermatitis: Secondary | ICD-10-CM | POA: Diagnosis not present

## 2017-09-29 DIAGNOSIS — L820 Inflamed seborrheic keratosis: Secondary | ICD-10-CM | POA: Diagnosis not present

## 2017-09-29 DIAGNOSIS — H353111 Nonexudative age-related macular degeneration, right eye, early dry stage: Secondary | ICD-10-CM | POA: Diagnosis not present

## 2017-10-05 DIAGNOSIS — D649 Anemia, unspecified: Secondary | ICD-10-CM | POA: Diagnosis not present

## 2017-10-13 DIAGNOSIS — C9140 Hairy cell leukemia not having achieved remission: Secondary | ICD-10-CM | POA: Diagnosis not present

## 2017-10-20 DIAGNOSIS — C9140 Hairy cell leukemia not having achieved remission: Secondary | ICD-10-CM | POA: Diagnosis not present

## 2017-10-28 DIAGNOSIS — H43812 Vitreous degeneration, left eye: Secondary | ICD-10-CM | POA: Diagnosis not present

## 2017-10-28 DIAGNOSIS — Z961 Presence of intraocular lens: Secondary | ICD-10-CM | POA: Diagnosis not present

## 2017-10-28 DIAGNOSIS — H43821 Vitreomacular adhesion, right eye: Secondary | ICD-10-CM | POA: Diagnosis not present

## 2017-10-28 DIAGNOSIS — H353221 Exudative age-related macular degeneration, left eye, with active choroidal neovascularization: Secondary | ICD-10-CM | POA: Diagnosis not present

## 2017-10-28 DIAGNOSIS — H35412 Lattice degeneration of retina, left eye: Secondary | ICD-10-CM | POA: Diagnosis not present

## 2017-10-28 DIAGNOSIS — H35372 Puckering of macula, left eye: Secondary | ICD-10-CM | POA: Diagnosis not present

## 2017-10-28 DIAGNOSIS — H35373 Puckering of macula, bilateral: Secondary | ICD-10-CM | POA: Diagnosis not present

## 2017-11-05 ENCOUNTER — Encounter: Payer: Self-pay | Admitting: Interventional Cardiology

## 2017-11-06 ENCOUNTER — Other Ambulatory Visit: Payer: Self-pay | Admitting: *Deleted

## 2017-11-06 MED ORDER — PANTOPRAZOLE SODIUM 40 MG PO TBEC: 40.0000 mg | DELAYED_RELEASE_TABLET | Freq: Every day | ORAL | 0 refills | Status: DC

## 2017-11-11 DIAGNOSIS — C9140 Hairy cell leukemia not having achieved remission: Secondary | ICD-10-CM | POA: Diagnosis not present
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
## 2017-11-20 ENCOUNTER — Encounter: Payer: Self-pay | Admitting: Interventional Cardiology

## 2017-11-20 ENCOUNTER — Ambulatory Visit (INDEPENDENT_AMBULATORY_CARE_PROVIDER_SITE_OTHER): Payer: Medicare Other | Admitting: Interventional Cardiology

## 2017-11-20 VITALS — BP 128/68 | HR 56 | Ht 64.0 in | Wt 170.2 lb

## 2017-11-20 DIAGNOSIS — I359 Nonrheumatic aortic valve disorder, unspecified: Secondary | ICD-10-CM

## 2017-11-20 DIAGNOSIS — I209 Angina pectoris, unspecified: Secondary | ICD-10-CM | POA: Diagnosis not present

## 2017-11-20 DIAGNOSIS — E785 Hyperlipidemia, unspecified: Secondary | ICD-10-CM

## 2017-11-20 DIAGNOSIS — I10 Essential (primary) hypertension: Secondary | ICD-10-CM

## 2017-11-20 DIAGNOSIS — I259 Chronic ischemic heart disease, unspecified: Secondary | ICD-10-CM | POA: Diagnosis not present

## 2017-11-20 NOTE — Patient Instructions (Signed)
Medication Instructions:  Your physician recommends that you continue on your current medications as directed. Please refer to the Current Medication list given to you today.  Labwork: None  Testing/Procedures: Your physician has requested that you have an echocardiogram just prior to seeing Dr. Tamala Julian next year. Echocardiography is a painless test that uses sound waves to create images of your heart. It provides your doctor with information about the size and shape of your heart and how well your heart's chambers and valves are working. This procedure takes approximately one hour. There are no restrictions for this procedure.    Follow-Up: Your physician wants you to follow-up in: 1 year with Dr. Tamala Julian.  You will receive a reminder letter in the mail two months in advance. If you don't receive a letter, please call our office to schedule the follow-up appointment.   Any Other Special Instructions Will Be Listed Below (If Applicable).     If you need a refill on your cardiac medications before your next appointment, please call your pharmacy.

## 2017-11-24 DIAGNOSIS — H35372 Puckering of macula, left eye: Secondary | ICD-10-CM | POA: Diagnosis not present

## 2017-11-24 DIAGNOSIS — H35412 Lattice degeneration of retina, left eye: Secondary | ICD-10-CM | POA: Diagnosis not present

## 2017-11-24 DIAGNOSIS — Z961 Presence of intraocular lens: Secondary | ICD-10-CM | POA: Diagnosis not present

## 2017-11-24 DIAGNOSIS — H43812 Vitreous degeneration, left eye: Secondary | ICD-10-CM | POA: Diagnosis not present

## 2017-11-24 DIAGNOSIS — H43821 Vitreomacular adhesion, right eye: Secondary | ICD-10-CM | POA: Diagnosis not present

## 2017-11-24 DIAGNOSIS — H353221 Exudative age-related macular degeneration, left eye, with active choroidal neovascularization: Secondary | ICD-10-CM | POA: Diagnosis not present

## 2017-11-24 DIAGNOSIS — H353211 Exudative age-related macular degeneration, right eye, with active choroidal neovascularization: Secondary | ICD-10-CM | POA: Diagnosis not present

## 2017-12-02 DIAGNOSIS — Z961 Presence of intraocular lens: Secondary | ICD-10-CM | POA: Diagnosis not present

## 2017-12-02 DIAGNOSIS — H52203 Unspecified astigmatism, bilateral: Secondary | ICD-10-CM | POA: Diagnosis not present

## 2017-12-02 DIAGNOSIS — H401431 Capsular glaucoma with pseudoexfoliation of lens, bilateral, mild stage: Secondary | ICD-10-CM | POA: Diagnosis not present

## 2017-12-07 DIAGNOSIS — C9140 Hairy cell leukemia not having achieved remission: Secondary | ICD-10-CM | POA: Diagnosis not present

## 2017-12-22 DIAGNOSIS — H35412 Lattice degeneration of retina, left eye: Secondary | ICD-10-CM | POA: Diagnosis not present

## 2017-12-22 DIAGNOSIS — H353221 Exudative age-related macular degeneration, left eye, with active choroidal neovascularization: Secondary | ICD-10-CM | POA: Diagnosis not present

## 2017-12-22 DIAGNOSIS — Z961 Presence of intraocular lens: Secondary | ICD-10-CM | POA: Diagnosis not present

## 2017-12-22 DIAGNOSIS — H353111 Nonexudative age-related macular degeneration, right eye, early dry stage: Secondary | ICD-10-CM | POA: Diagnosis not present

## 2017-12-22 DIAGNOSIS — H35373 Puckering of macula, bilateral: Secondary | ICD-10-CM | POA: Diagnosis not present

## 2017-12-23 DIAGNOSIS — Z23 Encounter for immunization: Secondary | ICD-10-CM | POA: Diagnosis not present

## 2018-01-05 DIAGNOSIS — R972 Elevated prostate specific antigen [PSA]: Secondary | ICD-10-CM | POA: Diagnosis not present

## 2018-01-05 DIAGNOSIS — F4321 Adjustment disorder with depressed mood: Secondary | ICD-10-CM | POA: Diagnosis not present

## 2018-01-05 DIAGNOSIS — Z85831 Personal history of malignant neoplasm of soft tissue: Secondary | ICD-10-CM | POA: Diagnosis not present

## 2018-01-05 DIAGNOSIS — D696 Thrombocytopenia, unspecified: Secondary | ICD-10-CM | POA: Diagnosis not present

## 2018-01-05 DIAGNOSIS — R911 Solitary pulmonary nodule: Secondary | ICD-10-CM | POA: Diagnosis not present

## 2018-01-05 DIAGNOSIS — Z9049 Acquired absence of other specified parts of digestive tract: Secondary | ICD-10-CM | POA: Diagnosis not present

## 2018-01-05 DIAGNOSIS — C9140 Hairy cell leukemia not having achieved remission: Secondary | ICD-10-CM | POA: Diagnosis not present

## 2018-01-05 DIAGNOSIS — Z85038 Personal history of other malignant neoplasm of large intestine: Secondary | ICD-10-CM | POA: Diagnosis not present

## 2018-01-05 DIAGNOSIS — Z9081 Acquired absence of spleen: Secondary | ICD-10-CM | POA: Diagnosis not present

## 2018-01-05 DIAGNOSIS — C9141 Hairy cell leukemia, in remission: Secondary | ICD-10-CM | POA: Diagnosis not present

## 2018-01-05 DIAGNOSIS — D61818 Other pancytopenia: Secondary | ICD-10-CM | POA: Diagnosis not present

## 2018-01-05 DIAGNOSIS — R351 Nocturia: Secondary | ICD-10-CM | POA: Diagnosis not present

## 2018-01-06 DIAGNOSIS — R2681 Unsteadiness on feet: Secondary | ICD-10-CM | POA: Diagnosis not present

## 2018-01-06 DIAGNOSIS — I10 Essential (primary) hypertension: Secondary | ICD-10-CM | POA: Diagnosis not present

## 2018-01-06 DIAGNOSIS — M5136 Other intervertebral disc degeneration, lumbar region: Secondary | ICD-10-CM | POA: Diagnosis not present

## 2018-01-06 DIAGNOSIS — Z6829 Body mass index (BMI) 29.0-29.9, adult: Secondary | ICD-10-CM | POA: Diagnosis not present

## 2018-01-19 DIAGNOSIS — H43812 Vitreous degeneration, left eye: Secondary | ICD-10-CM | POA: Diagnosis not present

## 2018-01-19 DIAGNOSIS — H35373 Puckering of macula, bilateral: Secondary | ICD-10-CM | POA: Diagnosis not present

## 2018-01-19 DIAGNOSIS — H353221 Exudative age-related macular degeneration, left eye, with active choroidal neovascularization: Secondary | ICD-10-CM | POA: Diagnosis not present

## 2018-01-19 DIAGNOSIS — H35412 Lattice degeneration of retina, left eye: Secondary | ICD-10-CM | POA: Diagnosis not present

## 2018-01-19 DIAGNOSIS — H353111 Nonexudative age-related macular degeneration, right eye, early dry stage: Secondary | ICD-10-CM | POA: Diagnosis not present

## 2018-01-20 DIAGNOSIS — R293 Abnormal posture: Secondary | ICD-10-CM | POA: Diagnosis not present

## 2018-01-20 DIAGNOSIS — R262 Difficulty in walking, not elsewhere classified: Secondary | ICD-10-CM | POA: Diagnosis not present

## 2018-01-20 DIAGNOSIS — R2681 Unsteadiness on feet: Secondary | ICD-10-CM | POA: Diagnosis not present

## 2018-01-20 DIAGNOSIS — M6281 Muscle weakness (generalized): Secondary | ICD-10-CM | POA: Diagnosis not present

## 2018-01-22 DIAGNOSIS — R2681 Unsteadiness on feet: Secondary | ICD-10-CM | POA: Diagnosis not present

## 2018-01-22 DIAGNOSIS — R293 Abnormal posture: Secondary | ICD-10-CM | POA: Diagnosis not present

## 2018-01-22 DIAGNOSIS — M6281 Muscle weakness (generalized): Secondary | ICD-10-CM | POA: Diagnosis not present

## 2018-01-22 DIAGNOSIS — R262 Difficulty in walking, not elsewhere classified: Secondary | ICD-10-CM | POA: Diagnosis not present

## 2018-01-25 DIAGNOSIS — R262 Difficulty in walking, not elsewhere classified: Secondary | ICD-10-CM | POA: Diagnosis not present

## 2018-01-25 DIAGNOSIS — R2681 Unsteadiness on feet: Secondary | ICD-10-CM | POA: Diagnosis not present

## 2018-01-25 DIAGNOSIS — M6281 Muscle weakness (generalized): Secondary | ICD-10-CM | POA: Diagnosis not present

## 2018-01-25 DIAGNOSIS — R293 Abnormal posture: Secondary | ICD-10-CM | POA: Diagnosis not present

## 2018-01-26 ENCOUNTER — Other Ambulatory Visit: Payer: Self-pay | Admitting: *Deleted

## 2018-01-26 MED ORDER — PANTOPRAZOLE SODIUM 40 MG PO TBEC: 40.0000 mg | DELAYED_RELEASE_TABLET | Freq: Every day | ORAL | 2 refills | Status: DC

## 2018-01-27 DIAGNOSIS — R262 Difficulty in walking, not elsewhere classified: Secondary | ICD-10-CM | POA: Diagnosis not present

## 2018-01-27 DIAGNOSIS — R2681 Unsteadiness on feet: Secondary | ICD-10-CM | POA: Diagnosis not present

## 2018-01-27 DIAGNOSIS — R293 Abnormal posture: Secondary | ICD-10-CM | POA: Diagnosis not present

## 2018-01-27 DIAGNOSIS — M6281 Muscle weakness (generalized): Secondary | ICD-10-CM | POA: Diagnosis not present

## 2018-02-01 ENCOUNTER — Other Ambulatory Visit: Payer: Self-pay | Admitting: *Deleted

## 2018-02-01 DIAGNOSIS — M6281 Muscle weakness (generalized): Secondary | ICD-10-CM | POA: Diagnosis not present

## 2018-02-01 DIAGNOSIS — R2681 Unsteadiness on feet: Secondary | ICD-10-CM | POA: Diagnosis not present

## 2018-02-01 DIAGNOSIS — R293 Abnormal posture: Secondary | ICD-10-CM | POA: Diagnosis not present

## 2018-02-01 DIAGNOSIS — R262 Difficulty in walking, not elsewhere classified: Secondary | ICD-10-CM | POA: Diagnosis not present

## 2018-02-01 MED ORDER — METOPROLOL TARTRATE 25 MG PO TABS: ORAL_TABLET | ORAL | 2 refills | Status: DC

## 2018-02-02 DIAGNOSIS — D696 Thrombocytopenia, unspecified: Secondary | ICD-10-CM | POA: Diagnosis not present

## 2018-02-02 DIAGNOSIS — C9140 Hairy cell leukemia not having achieved remission: Secondary | ICD-10-CM | POA: Diagnosis not present

## 2018-02-03 DIAGNOSIS — R293 Abnormal posture: Secondary | ICD-10-CM | POA: Diagnosis not present

## 2018-02-03 DIAGNOSIS — R2681 Unsteadiness on feet: Secondary | ICD-10-CM | POA: Diagnosis not present

## 2018-02-03 DIAGNOSIS — R262 Difficulty in walking, not elsewhere classified: Secondary | ICD-10-CM | POA: Diagnosis not present

## 2018-02-03 DIAGNOSIS — M6281 Muscle weakness (generalized): Secondary | ICD-10-CM | POA: Diagnosis not present

## 2018-02-08 DIAGNOSIS — R2681 Unsteadiness on feet: Secondary | ICD-10-CM | POA: Diagnosis not present

## 2018-02-08 DIAGNOSIS — R293 Abnormal posture: Secondary | ICD-10-CM | POA: Diagnosis not present

## 2018-02-08 DIAGNOSIS — M6281 Muscle weakness (generalized): Secondary | ICD-10-CM | POA: Diagnosis not present

## 2018-02-08 DIAGNOSIS — R262 Difficulty in walking, not elsewhere classified: Secondary | ICD-10-CM | POA: Diagnosis not present

## 2018-02-09 DIAGNOSIS — E7849 Other hyperlipidemia: Secondary | ICD-10-CM | POA: Diagnosis not present

## 2018-02-09 DIAGNOSIS — I10 Essential (primary) hypertension: Secondary | ICD-10-CM | POA: Diagnosis not present

## 2018-02-09 DIAGNOSIS — R82998 Other abnormal findings in urine: Secondary | ICD-10-CM | POA: Diagnosis not present

## 2018-02-10 DIAGNOSIS — M6281 Muscle weakness (generalized): Secondary | ICD-10-CM | POA: Diagnosis not present

## 2018-02-10 DIAGNOSIS — R293 Abnormal posture: Secondary | ICD-10-CM | POA: Diagnosis not present

## 2018-02-10 DIAGNOSIS — R2681 Unsteadiness on feet: Secondary | ICD-10-CM | POA: Diagnosis not present

## 2018-02-10 DIAGNOSIS — R262 Difficulty in walking, not elsewhere classified: Secondary | ICD-10-CM | POA: Diagnosis not present

## 2018-02-11 DIAGNOSIS — Z125 Encounter for screening for malignant neoplasm of prostate: Secondary | ICD-10-CM | POA: Diagnosis not present

## 2018-02-16 DIAGNOSIS — I2510 Atherosclerotic heart disease of native coronary artery without angina pectoris: Secondary | ICD-10-CM | POA: Diagnosis not present

## 2018-02-16 DIAGNOSIS — H43821 Vitreomacular adhesion, right eye: Secondary | ICD-10-CM | POA: Diagnosis not present

## 2018-02-16 DIAGNOSIS — H35373 Puckering of macula, bilateral: Secondary | ICD-10-CM | POA: Diagnosis not present

## 2018-02-16 DIAGNOSIS — R011 Cardiac murmur, unspecified: Secondary | ICD-10-CM | POA: Diagnosis not present

## 2018-02-16 DIAGNOSIS — Z1212 Encounter for screening for malignant neoplasm of rectum: Secondary | ICD-10-CM | POA: Diagnosis not present

## 2018-02-16 DIAGNOSIS — H9193 Unspecified hearing loss, bilateral: Secondary | ICD-10-CM | POA: Diagnosis not present

## 2018-02-16 DIAGNOSIS — Z6829 Body mass index (BMI) 29.0-29.9, adult: Secondary | ICD-10-CM | POA: Diagnosis not present

## 2018-02-16 DIAGNOSIS — F3289 Other specified depressive episodes: Secondary | ICD-10-CM | POA: Diagnosis not present

## 2018-02-16 DIAGNOSIS — Z1389 Encounter for screening for other disorder: Secondary | ICD-10-CM | POA: Diagnosis not present

## 2018-02-16 DIAGNOSIS — E7849 Other hyperlipidemia: Secondary | ICD-10-CM | POA: Diagnosis not present

## 2018-02-16 DIAGNOSIS — Z961 Presence of intraocular lens: Secondary | ICD-10-CM | POA: Diagnosis not present

## 2018-02-16 DIAGNOSIS — R972 Elevated prostate specific antigen [PSA]: Secondary | ICD-10-CM | POA: Diagnosis not present

## 2018-02-16 DIAGNOSIS — H353221 Exudative age-related macular degeneration, left eye, with active choroidal neovascularization: Secondary | ICD-10-CM | POA: Diagnosis not present

## 2018-02-16 DIAGNOSIS — I10 Essential (primary) hypertension: Secondary | ICD-10-CM | POA: Diagnosis not present

## 2018-02-16 DIAGNOSIS — Z0000 Encounter for general adult medical examination without abnormal findings: Secondary | ICD-10-CM | POA: Diagnosis not present

## 2018-02-16 DIAGNOSIS — D696 Thrombocytopenia, unspecified: Secondary | ICD-10-CM | POA: Diagnosis not present

## 2018-02-16 DIAGNOSIS — H35412 Lattice degeneration of retina, left eye: Secondary | ICD-10-CM | POA: Diagnosis not present

## 2018-02-16 DIAGNOSIS — C189 Malignant neoplasm of colon, unspecified: Secondary | ICD-10-CM | POA: Diagnosis not present

## 2018-02-17 DIAGNOSIS — R262 Difficulty in walking, not elsewhere classified: Secondary | ICD-10-CM | POA: Diagnosis not present

## 2018-02-17 DIAGNOSIS — R2681 Unsteadiness on feet: Secondary | ICD-10-CM | POA: Diagnosis not present

## 2018-02-17 DIAGNOSIS — R293 Abnormal posture: Secondary | ICD-10-CM | POA: Diagnosis not present

## 2018-02-17 DIAGNOSIS — M6281 Muscle weakness (generalized): Secondary | ICD-10-CM | POA: Diagnosis not present

## 2018-03-16 DIAGNOSIS — H539 Unspecified visual disturbance: Secondary | ICD-10-CM | POA: Diagnosis not present

## 2018-03-16 DIAGNOSIS — Z87891 Personal history of nicotine dependence: Secondary | ICD-10-CM | POA: Diagnosis not present

## 2018-03-16 DIAGNOSIS — M009 Pyogenic arthritis, unspecified: Secondary | ICD-10-CM | POA: Diagnosis not present

## 2018-03-16 DIAGNOSIS — C9140 Hairy cell leukemia not having achieved remission: Secondary | ICD-10-CM | POA: Diagnosis not present

## 2018-03-16 DIAGNOSIS — Z79899 Other long term (current) drug therapy: Secondary | ICD-10-CM | POA: Diagnosis not present

## 2018-03-16 DIAGNOSIS — K3521 Acute appendicitis with generalized peritonitis, with abscess: Secondary | ICD-10-CM | POA: Diagnosis not present

## 2018-03-16 DIAGNOSIS — C187 Malignant neoplasm of sigmoid colon: Secondary | ICD-10-CM | POA: Diagnosis not present

## 2018-03-16 DIAGNOSIS — R911 Solitary pulmonary nodule: Secondary | ICD-10-CM | POA: Diagnosis not present

## 2018-03-16 DIAGNOSIS — N400 Enlarged prostate without lower urinary tract symptoms: Secondary | ICD-10-CM | POA: Diagnosis not present

## 2018-03-16 DIAGNOSIS — R972 Elevated prostate specific antigen [PSA]: Secondary | ICD-10-CM | POA: Diagnosis not present

## 2018-03-16 DIAGNOSIS — D696 Thrombocytopenia, unspecified: Secondary | ICD-10-CM | POA: Diagnosis not present

## 2018-03-16 DIAGNOSIS — I2510 Atherosclerotic heart disease of native coronary artery without angina pectoris: Secondary | ICD-10-CM | POA: Diagnosis not present

## 2018-03-16 DIAGNOSIS — F4321 Adjustment disorder with depressed mood: Secondary | ICD-10-CM | POA: Diagnosis not present

## 2018-03-24 DIAGNOSIS — H43821 Vitreomacular adhesion, right eye: Secondary | ICD-10-CM | POA: Diagnosis not present

## 2018-03-24 DIAGNOSIS — H35373 Puckering of macula, bilateral: Secondary | ICD-10-CM | POA: Diagnosis not present

## 2018-03-24 DIAGNOSIS — H353221 Exudative age-related macular degeneration, left eye, with active choroidal neovascularization: Secondary | ICD-10-CM | POA: Diagnosis not present

## 2018-03-24 DIAGNOSIS — H43812 Vitreous degeneration, left eye: Secondary | ICD-10-CM | POA: Diagnosis not present

## 2018-03-24 DIAGNOSIS — H353111 Nonexudative age-related macular degeneration, right eye, early dry stage: Secondary | ICD-10-CM | POA: Diagnosis not present

## 2018-04-20 DIAGNOSIS — L308 Other specified dermatitis: Secondary | ICD-10-CM | POA: Diagnosis not present

## 2018-04-20 DIAGNOSIS — Z85828 Personal history of other malignant neoplasm of skin: Secondary | ICD-10-CM | POA: Diagnosis not present

## 2018-04-20 DIAGNOSIS — C9140 Hairy cell leukemia not having achieved remission: Secondary | ICD-10-CM | POA: Diagnosis not present

## 2018-04-20 DIAGNOSIS — L570 Actinic keratosis: Secondary | ICD-10-CM | POA: Diagnosis not present

## 2018-04-20 DIAGNOSIS — Z79899 Other long term (current) drug therapy: Secondary | ICD-10-CM | POA: Diagnosis not present

## 2018-04-20 DIAGNOSIS — Z85038 Personal history of other malignant neoplasm of large intestine: Secondary | ICD-10-CM | POA: Diagnosis not present

## 2018-04-20 DIAGNOSIS — L820 Inflamed seborrheic keratosis: Secondary | ICD-10-CM | POA: Diagnosis not present

## 2018-04-20 DIAGNOSIS — Z08 Encounter for follow-up examination after completed treatment for malignant neoplasm: Secondary | ICD-10-CM | POA: Diagnosis not present

## 2018-04-28 DIAGNOSIS — H35373 Puckering of macula, bilateral: Secondary | ICD-10-CM | POA: Diagnosis not present

## 2018-04-28 DIAGNOSIS — H43821 Vitreomacular adhesion, right eye: Secondary | ICD-10-CM | POA: Diagnosis not present

## 2018-04-28 DIAGNOSIS — H353111 Nonexudative age-related macular degeneration, right eye, early dry stage: Secondary | ICD-10-CM | POA: Diagnosis not present

## 2018-04-28 DIAGNOSIS — H35412 Lattice degeneration of retina, left eye: Secondary | ICD-10-CM | POA: Diagnosis not present

## 2018-04-28 DIAGNOSIS — H353221 Exudative age-related macular degeneration, left eye, with active choroidal neovascularization: Secondary | ICD-10-CM | POA: Diagnosis not present

## 2018-05-06 ENCOUNTER — Other Ambulatory Visit: Payer: Self-pay | Admitting: Nurse Practitioner

## 2018-06-29 DIAGNOSIS — H35373 Puckering of macula, bilateral: Secondary | ICD-10-CM | POA: Diagnosis not present

## 2018-06-29 DIAGNOSIS — H43821 Vitreomacular adhesion, right eye: Secondary | ICD-10-CM | POA: Diagnosis not present

## 2018-06-29 DIAGNOSIS — H353221 Exudative age-related macular degeneration, left eye, with active choroidal neovascularization: Secondary | ICD-10-CM | POA: Diagnosis not present

## 2018-06-29 DIAGNOSIS — H35363 Drusen (degenerative) of macula, bilateral: Secondary | ICD-10-CM | POA: Diagnosis not present

## 2018-06-29 DIAGNOSIS — Z961 Presence of intraocular lens: Secondary | ICD-10-CM | POA: Diagnosis not present

## 2018-06-29 DIAGNOSIS — H35412 Lattice degeneration of retina, left eye: Secondary | ICD-10-CM | POA: Diagnosis not present

## 2018-07-12 DIAGNOSIS — C9110 Chronic lymphocytic leukemia of B-cell type not having achieved remission: Secondary | ICD-10-CM | POA: Diagnosis not present

## 2018-07-14 DIAGNOSIS — C9140 Hairy cell leukemia not having achieved remission: Secondary | ICD-10-CM | POA: Diagnosis not present

## 2018-07-29 DIAGNOSIS — H43821 Vitreomacular adhesion, right eye: Secondary | ICD-10-CM | POA: Diagnosis not present

## 2018-07-29 DIAGNOSIS — H43812 Vitreous degeneration, left eye: Secondary | ICD-10-CM | POA: Diagnosis not present

## 2018-07-29 DIAGNOSIS — H35373 Puckering of macula, bilateral: Secondary | ICD-10-CM | POA: Diagnosis not present

## 2018-07-29 DIAGNOSIS — Z961 Presence of intraocular lens: Secondary | ICD-10-CM | POA: Diagnosis not present

## 2018-07-29 DIAGNOSIS — H353110 Nonexudative age-related macular degeneration, right eye, stage unspecified: Secondary | ICD-10-CM | POA: Diagnosis not present

## 2018-07-29 DIAGNOSIS — H353221 Exudative age-related macular degeneration, left eye, with active choroidal neovascularization: Secondary | ICD-10-CM | POA: Diagnosis not present

## 2018-08-12 ENCOUNTER — Other Ambulatory Visit: Payer: Self-pay | Admitting: Interventional Cardiology

## 2018-08-12 MED ORDER — NITROGLYCERIN 0.4 MG SL SUBL: 0.4000 mg | SUBLINGUAL_TABLET | SUBLINGUAL | 2 refills | Status: DC | PRN

## 2018-08-23 DIAGNOSIS — H6122 Impacted cerumen, left ear: Secondary | ICD-10-CM | POA: Diagnosis not present

## 2018-08-23 DIAGNOSIS — H60331 Swimmer's ear, right ear: Secondary | ICD-10-CM | POA: Diagnosis not present

## 2018-08-23 DIAGNOSIS — H903 Sensorineural hearing loss, bilateral: Secondary | ICD-10-CM | POA: Diagnosis not present

## 2018-08-23 DIAGNOSIS — H608X3 Other otitis externa, bilateral: Secondary | ICD-10-CM | POA: Diagnosis not present

## 2018-08-24 DIAGNOSIS — H35373 Puckering of macula, bilateral: Secondary | ICD-10-CM | POA: Diagnosis not present

## 2018-08-24 DIAGNOSIS — H43821 Vitreomacular adhesion, right eye: Secondary | ICD-10-CM | POA: Diagnosis not present

## 2018-08-24 DIAGNOSIS — H353221 Exudative age-related macular degeneration, left eye, with active choroidal neovascularization: Secondary | ICD-10-CM | POA: Diagnosis not present

## 2018-08-24 DIAGNOSIS — H43812 Vitreous degeneration, left eye: Secondary | ICD-10-CM | POA: Diagnosis not present

## 2018-08-24 DIAGNOSIS — H353111 Nonexudative age-related macular degeneration, right eye, early dry stage: Secondary | ICD-10-CM | POA: Diagnosis not present

## 2018-09-15 ENCOUNTER — Other Ambulatory Visit: Payer: Self-pay | Admitting: Interventional Cardiology

## 2018-09-15 ENCOUNTER — Other Ambulatory Visit: Payer: Self-pay | Admitting: Nurse Practitioner

## 2018-09-17 DIAGNOSIS — I10 Essential (primary) hypertension: Secondary | ICD-10-CM | POA: Diagnosis not present

## 2018-09-17 DIAGNOSIS — C9140 Hairy cell leukemia not having achieved remission: Secondary | ICD-10-CM | POA: Diagnosis not present

## 2018-09-20 DIAGNOSIS — H6122 Impacted cerumen, left ear: Secondary | ICD-10-CM | POA: Diagnosis not present

## 2018-09-20 DIAGNOSIS — H60331 Swimmer's ear, right ear: Secondary | ICD-10-CM | POA: Diagnosis not present

## 2018-09-20 DIAGNOSIS — H60332 Swimmer's ear, left ear: Secondary | ICD-10-CM | POA: Diagnosis not present

## 2018-09-21 DIAGNOSIS — H35372 Puckering of macula, left eye: Secondary | ICD-10-CM | POA: Diagnosis not present

## 2018-09-21 DIAGNOSIS — H43812 Vitreous degeneration, left eye: Secondary | ICD-10-CM | POA: Diagnosis not present

## 2018-09-21 DIAGNOSIS — H353111 Nonexudative age-related macular degeneration, right eye, early dry stage: Secondary | ICD-10-CM | POA: Diagnosis not present

## 2018-09-21 DIAGNOSIS — H353221 Exudative age-related macular degeneration, left eye, with active choroidal neovascularization: Secondary | ICD-10-CM | POA: Diagnosis not present

## 2018-09-21 DIAGNOSIS — H43821 Vitreomacular adhesion, right eye: Secondary | ICD-10-CM | POA: Diagnosis not present

## 2018-10-19 DIAGNOSIS — H353110 Nonexudative age-related macular degeneration, right eye, stage unspecified: Secondary | ICD-10-CM | POA: Diagnosis not present

## 2018-10-19 DIAGNOSIS — H35412 Lattice degeneration of retina, left eye: Secondary | ICD-10-CM | POA: Diagnosis not present

## 2018-10-19 DIAGNOSIS — Z961 Presence of intraocular lens: Secondary | ICD-10-CM | POA: Diagnosis not present

## 2018-10-19 DIAGNOSIS — H43812 Vitreous degeneration, left eye: Secondary | ICD-10-CM | POA: Diagnosis not present

## 2018-10-19 DIAGNOSIS — H35372 Puckering of macula, left eye: Secondary | ICD-10-CM | POA: Diagnosis not present

## 2018-10-19 DIAGNOSIS — H353221 Exudative age-related macular degeneration, left eye, with active choroidal neovascularization: Secondary | ICD-10-CM | POA: Diagnosis not present

## 2018-10-25 DIAGNOSIS — D696 Thrombocytopenia, unspecified: Secondary | ICD-10-CM | POA: Diagnosis not present

## 2018-10-25 DIAGNOSIS — E7849 Other hyperlipidemia: Secondary | ICD-10-CM | POA: Diagnosis not present

## 2018-10-28 DIAGNOSIS — C493 Malignant neoplasm of connective and soft tissue of thorax: Secondary | ICD-10-CM | POA: Diagnosis not present

## 2018-10-28 DIAGNOSIS — Z856 Personal history of leukemia: Secondary | ICD-10-CM | POA: Diagnosis not present

## 2018-10-28 DIAGNOSIS — R911 Solitary pulmonary nodule: Secondary | ICD-10-CM | POA: Diagnosis not present

## 2018-10-28 DIAGNOSIS — C499 Malignant neoplasm of connective and soft tissue, unspecified: Secondary | ICD-10-CM | POA: Diagnosis not present

## 2018-10-29 DIAGNOSIS — D696 Thrombocytopenia, unspecified: Secondary | ICD-10-CM | POA: Diagnosis not present

## 2018-10-29 DIAGNOSIS — C9140 Hairy cell leukemia not having achieved remission: Secondary | ICD-10-CM | POA: Diagnosis not present

## 2018-10-29 DIAGNOSIS — D691 Qualitative platelet defects: Secondary | ICD-10-CM | POA: Diagnosis not present

## 2018-10-29 DIAGNOSIS — R911 Solitary pulmonary nodule: Secondary | ICD-10-CM | POA: Diagnosis not present

## 2018-10-29 DIAGNOSIS — R972 Elevated prostate specific antigen [PSA]: Secondary | ICD-10-CM | POA: Diagnosis not present

## 2018-11-16 DIAGNOSIS — H35373 Puckering of macula, bilateral: Secondary | ICD-10-CM | POA: Diagnosis not present

## 2018-11-16 DIAGNOSIS — H43812 Vitreous degeneration, left eye: Secondary | ICD-10-CM | POA: Diagnosis not present

## 2018-11-16 DIAGNOSIS — H43821 Vitreomacular adhesion, right eye: Secondary | ICD-10-CM | POA: Diagnosis not present

## 2018-11-16 DIAGNOSIS — H353221 Exudative age-related macular degeneration, left eye, with active choroidal neovascularization: Secondary | ICD-10-CM | POA: Diagnosis not present

## 2018-11-16 DIAGNOSIS — H353111 Nonexudative age-related macular degeneration, right eye, early dry stage: Secondary | ICD-10-CM | POA: Diagnosis not present

## 2018-11-23 DIAGNOSIS — D485 Neoplasm of uncertain behavior of skin: Secondary | ICD-10-CM | POA: Diagnosis not present

## 2018-11-23 DIAGNOSIS — L820 Inflamed seborrheic keratosis: Secondary | ICD-10-CM | POA: Diagnosis not present

## 2018-11-23 DIAGNOSIS — D691 Qualitative platelet defects: Secondary | ICD-10-CM | POA: Diagnosis not present

## 2018-11-23 DIAGNOSIS — D696 Thrombocytopenia, unspecified: Secondary | ICD-10-CM | POA: Diagnosis not present

## 2018-11-23 DIAGNOSIS — L308 Other specified dermatitis: Secondary | ICD-10-CM | POA: Diagnosis not present

## 2018-11-23 DIAGNOSIS — Z9081 Acquired absence of spleen: Secondary | ICD-10-CM | POA: Diagnosis not present

## 2018-11-23 DIAGNOSIS — L570 Actinic keratosis: Secondary | ICD-10-CM | POA: Diagnosis not present

## 2018-11-23 DIAGNOSIS — Z85828 Personal history of other malignant neoplasm of skin: Secondary | ICD-10-CM | POA: Diagnosis not present

## 2018-11-25 DIAGNOSIS — Z23 Encounter for immunization: Secondary | ICD-10-CM | POA: Diagnosis not present

## 2018-12-07 DIAGNOSIS — H401431 Capsular glaucoma with pseudoexfoliation of lens, bilateral, mild stage: Secondary | ICD-10-CM | POA: Diagnosis not present

## 2018-12-07 DIAGNOSIS — H52203 Unspecified astigmatism, bilateral: Secondary | ICD-10-CM | POA: Diagnosis not present

## 2018-12-07 DIAGNOSIS — Z961 Presence of intraocular lens: Secondary | ICD-10-CM | POA: Diagnosis not present

## 2018-12-15 ENCOUNTER — Other Ambulatory Visit: Payer: Self-pay | Admitting: Interventional Cardiology

## 2018-12-15 DIAGNOSIS — H353110 Nonexudative age-related macular degeneration, right eye, stage unspecified: Secondary | ICD-10-CM | POA: Diagnosis not present

## 2018-12-15 DIAGNOSIS — H35372 Puckering of macula, left eye: Secondary | ICD-10-CM | POA: Diagnosis not present

## 2018-12-15 DIAGNOSIS — H353221 Exudative age-related macular degeneration, left eye, with active choroidal neovascularization: Secondary | ICD-10-CM | POA: Diagnosis not present

## 2018-12-15 DIAGNOSIS — Z961 Presence of intraocular lens: Secondary | ICD-10-CM | POA: Diagnosis not present

## 2018-12-15 DIAGNOSIS — H43821 Vitreomacular adhesion, right eye: Secondary | ICD-10-CM | POA: Diagnosis not present

## 2018-12-15 DIAGNOSIS — H35412 Lattice degeneration of retina, left eye: Secondary | ICD-10-CM | POA: Diagnosis not present

## 2018-12-15 MED ORDER — METOPROLOL TARTRATE 25 MG PO TABS: ORAL_TABLET | ORAL | 0 refills | Status: DC

## 2018-12-27 ENCOUNTER — Ambulatory Visit (HOSPITAL_COMMUNITY): Payer: Medicare Other | Attending: Cardiology

## 2018-12-27 ENCOUNTER — Other Ambulatory Visit: Payer: Self-pay

## 2018-12-27 DIAGNOSIS — I359 Nonrheumatic aortic valve disorder, unspecified: Secondary | ICD-10-CM | POA: Diagnosis not present

## 2018-12-27 MED ORDER — PERFLUTREN LIPID MICROSPHERE
1.0000 mL | INTRAVENOUS | Status: AC | PRN
  Administered 2018-12-27: 2 mL via INTRAVENOUS

## 2019-01-05 ENCOUNTER — Telehealth: Payer: Self-pay | Admitting: Interventional Cardiology

## 2019-01-05 NOTE — Telephone Encounter (Signed)
New Message  Patient is calling in to get approval for his wife [name] to accompany him to his appointment on tomorrow 01/06/19 at 9:20 am with Dr. Tamala Julian. Please give patient a call back to confirm.

## 2019-01-05 NOTE — Progress Notes (Signed)
Cardiology Office Note:    Date:  01/06/2019   ID:  [name], DOB 07-May-1923, MRN [id number]  PCP:  [name], MD  Cardiologist:  No primary care provider on file.   Referring MD: [name], MD   Chief Complaint  Patient presents with  . Coronary Artery Disease  . Cardiac Valve Problem    Aortic stenosis    History of Present Illness:    [name] is a 85 y.o. male with a hx of chronic coronary artery disease with overlapping DES and mid RCA 2016, residual LAD disease, hyperlipidemia, chronic diastolic heart failure, and other problems including Harry cell leukemia and chronic anemia.Or recently he has had thrombocytopenia.   [name] returns today for clinical follow-up.  He is accompanied by his wife.  He complains of decreased energy and shortness of breath with activities that were previously not very difficult to achieve.  When he walks outside he has to stop and rest.  He is now trying to be more active after having been essentially inside predominantly since March when the COVID-19 pandemic started.  He is not having chest pain.  He denies swelling.  There is no orthopnea.  He is concerned about the possibility of blockages in his legs because when he walks they give out and he is concerned about circulation.  He has not had neurological symptoms.    Past Medical History:  Diagnosis Date  . Acute appendicitis   . Anemia   . Anxiety   . Arthritis    "back" (03/14/2014)  . CAD (coronary artery disease)    a. 03/14/14  s/p overlapping DES x2 to mid-distal RCA.  . 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 Colon cancer ([place]) 1984  . Compression fracture of lumbar spine, non-traumatic ([place])   . DJD (degenerative joint disease) of lumbar spine   . Dyslipidemia   . Elevated PSA   . GERD (gastroesophageal reflux disease)   . Glaucoma   . Hairy cell leukemia ([place]) dx'd 1980  . History of blood transfusion    "several; related to hairy  cell leukemia & tx "  . History of stomach ulcers 1968  . Hypertension   . Malnutrition ([place])   . Osteoarthritis   . Osteoporosis   . Skin cancer of face     Past Surgical History:  Procedure Laterality Date  . APPENDECTOMY  10/2007  . CATARACT EXTRACTION, BILATERAL Bilateral 02/2008  . COLON SURGERY  1984   "sigmoid; open"  . CORONARY ANGIOPLASTY WITH STENT PLACEMENT  03/14/2014   "2"  . FRACTURE SURGERY    . LEFT HEART CATHETERIZATION WITH CORONARY ANGIOGRAM N/A 03/14/2014   Procedure: LEFT HEART CATHETERIZATION WITH CORONARY ANGIOGRAM;  Surgeon: Peter M Martinique, MD;  Location: [facility] CATH LAB;  Service: Cardiovascular;  Laterality: N/A;  . LIPOMA EXCISION Right 07/2011   liposarcoma resection; "back"  . MOHS SURGERY Left 02/2011  . MOHS SURGERY  X 3   "all on my face"  . MOLE REMOVAL Left 1985   cheek  . ORIF ANKLE FRACTURE Left 2000  . SHOULDER SURGERY  04/2004  . SPLENECTOMY  1992  . TONSILLECTOMY AND ADENOIDECTOMY  1939    Current Medications: Current Meds  Medication Sig  . atorvastatin (LIPITOR) 20 MG tablet TAKE 1 TABLET BY MOUTH DAILY AT 6PM  . Cholecalciferol (VITAMIN D-3 PO) Take 1,000 Units by mouth daily.   Marland Kitchen desonide (DESOWEN) 0.05 % cream Apply 1 application topically 2 (  two) times daily as needed. (Skin Irritation)  . fluocinonide (LIDEX) 0.05 % external solution APP TOPICALLY TO SCALP AND EARS twice daily as needed FOR DERMATITIS  . LATANOPROST OP Apply 1 drop to eye at bedtime. In each eye  . metoprolol tartrate (LOPRESSOR) 25 MG tablet TAKE ONE-HALF TABLET BY MOUTH DAILY. Please keep upcoming appt with Dr. Tamala Julian in November for future refills. Thank you  . Multiple Vitamins-Minerals (PRESERVISION AREDS 2 PO) Take 1 capsule by mouth 2 (two) times daily.   . nitroGLYCERIN (NITROSTAT) 0.4 MG SL tablet Place 1 tablet (0.4 mg total) under the tongue every 5 (five) minutes as needed for chest pain. Please make yearly appt with Dr. Tamala Julian for September. 1st attempt  .  ranibizumab (LUCENTIS) 0.5 MG/0.05ML SOLN Inject 0.5 mg into the LEFT eye every four (4) weeks.  . sertraline (ZOLOFT) 50 MG tablet Take 50 mg by mouth daily.  . tamsulosin (FLOMAX) 0.4 MG CAPS capsule Take 0.8 mg by mouth daily after supper.   . timolol (BETIMOL) 0.5 % ophthalmic solution Place 1 drop into the right eye daily.      Allergies:   Antazoline; Antihistamines, chlorpheniramine-type; and Sulfa antibiotics   Social History   Socioeconomic History  . Marital status: Married    Spouse name: Not on file  . Number of children: Not on file  . Years of education: Not on file  . Highest education level: Not on file  Occupational History  . Not on file  Social Needs  . Financial resource strain: Not on file  . Food insecurity    Worry: Not on file    Inability: Not on file  . Transportation needs    Medical: Not on file    Non-medical: Not on file  Tobacco Use  . Smoking status: Former Smoker    Packs/day: 0.25    Years: 1.00    Pack years: 0.25    Types: Cigarettes, Cigars  . Smokeless tobacco: Never Used  . Tobacco comment: occasional social smoker during college.  Substance and Sexual Activity  . Alcohol use: Yes    Alcohol/week: 9.0 standard drinks    Types: 2 Glasses of wine, 2 Shots of liquor, 5 Standard drinks or equivalent per week  . Drug use: No  . Sexual activity: Never  Lifestyle  . Physical activity    Days per week: Not on file    Minutes per session: Not on file  . Stress: Not on file  Relationships  . Social Herbalist on phone: Not on file    Gets together: Not on file    Attends religious service: Not on file    Active member of club or organization: Not on file    Attends meetings of clubs or organizations: Not on file    Relationship status: Not on file  Other Topics Concern  . Not on file  Social History Narrative  . Not on file     Family History: The patient's family history includes Congestive Heart Failure (age of  onset: 13) in his father; Diabetes Mellitus II in his brother; Heart Problems in his brother; Prostate cancer in his brother; Stroke in his mother.  ROS:   Please see the history of present illness.    He has significant back/disc disease.  All other systems reviewed and are negative.  EKGs/Labs/Other Studies Reviewed:    The following studies were reviewed today: IMPRESSIONS    1. Left ventricular ejection fraction, by visual  estimation, is 60 to 65%. The left ventricle has normal function. Normal left ventricular size. There is mildly increased left ventricular hypertrophy.  2. Global right ventricle has normal systolic function.The right ventricular size is normal.  3. Left atrial size was mildly dilated.  4. Right atrial size was normal.  5. The mitral valve is abnormal. Trace mitral valve regurgitation. No evidence of mitral stenosis.  6. The tricuspid valve is normal in structure. Tricuspid valve regurgitation is trivial.  7. The aortic valve is tricuspid Aortic valve regurgitation is mild by color flow Doppler. Moderate aortic valve stenosis.  8. The pulmonic valve was normal in structure. Pulmonic valve regurgitation is trivial by color flow Doppler.  9. Aortic dilatation noted. 10. There is mild dilatation of the ascending aorta measuring 38 mm. 11. The inferior vena cava is dilated in size with >50% respiratory variability, suggesting right atrial pressure of 8 mmHg. 12. Normal LV function; grade 1 diastolic dysfunction; mild LVH; mildy dilated aortic root; calcified aortic valve with moderate AS (mean gradient 32 mmHg) and mild AI; mild LAE.  EKG:  EKG sinus rhythm with right bundle, left anterior hemiblock, and normal PR interval at 168 ms.  There is poor R wave progression V1 through V4.  Recent Labs: No results found for requested labs within last 8760 hours.  Recent Lipid Panel    Component Value Date/Time   CHOL 122 (L) 12/10/2015 0949   TRIG 154 (H) 12/10/2015 0949    HDL 33 (L) 12/10/2015 0949   CHOLHDL 3.7 12/10/2015 0949   VLDL 31 (H) 12/10/2015 0949   LDLCALC 58 12/10/2015 0949    Physical Exam:    VS:  BP 118/66   Pulse 64   Ht 5\' 4"  (1.626 m)   Wt 164 lb (74.4 kg)   SpO2 96%   BMI 28.15 kg/m     Wt Readings from Last 3 Encounters:  01/06/19 164 lb (74.4 kg)  11/20/17 170 lb 3.2 oz (77.2 kg)  03/23/17 167 lb 12.8 oz (76.1 kg)    GEN: Palpable with age. No acute distress HEENT: Normal NECK: No JVD. LYMPHATICS: No lymphadenopathy CARDIAC: 3/6  left mid sternal border to right upper sternal border crescendo decrescendo systolic murmur compatible with aortic stenosis.  RRR without diastolic murmur, gallop, or edema. VASCULAR: Bounding 2+ bilateral posterior tibial pulses that can be felt through his socks.  No femoral  bruits. RESPIRATORY:  Clear to auscultation without rales, wheezing or rhonchi  ABDOMEN: Soft, non-tender, non-distended, No pulsatile mass, MUSCULOSKELETAL: No deformity  SKIN: Warm and dry NEUROLOGIC:  Alert and oriented x 3 PSYCHIATRIC:  Normal affect   ASSESSMENT:    1. Moderate calcific aortic stenosis   2. Angina pectoris ([place])   3. Dyslipidemia   4. Essential hypertension   5. Educated about COVID-19 virus infection   6. Aortic valve disease    PLAN:    In order of problems listed above:  1. Symptoms suggest the possibility that aortic stenosis is contributing to exertional intolerance.  I believe there is also a component of deconditioning related to the shot and and decrease physical activity during the COVID-19 pandemic.  He will return for reassessment of his aortic valve by echo in 6 months and have a office visit shortly thereafter with me.  We briefly discussed the natural history of aortic valve disease and potential treatment options.  A surgical option would not be prudent in his situation.  TAVR may also not be a reasonable therapy  given his age and stature.  I did recommend that he consider  shared decision making and thank very critically about end-of-life and procedures that he would have would not allow. 2. He is not currently having angina.  Dyspnea and fatigue could be ischemically mediated equivalents but I doubt this. 3. LDL target has been established at 70 and most recently in December 2019 it was 67. 4. His blood pressure is adequate.  I explained to he and his wife that as aortic stenosis worsens the blood pressure may actually start to decrease and his metoprolol dose may need to be discontinued.  He is currently taking metoprolol tartrate 12.5 mg once per day. 5. COVID-19 discussion including the 3 W'swhich are endorsed by the patient as a lifestyle pattern.  56-month clinical follow-up.  Echocardiogram will be done prior to the visit.  Further discussion at that time.  I encouraged careful physical activity.  Cautioned against pushing deep into symptoms before stopping to rest.  He understands.   Medication Adjustments/Labs and Tests Ordered: Current medicines are reviewed at length with the patient today.  Concerns regarding medicines are outlined above.  Orders Placed This Encounter  Procedures  . EKG 12-Lead  . ECHOCARDIOGRAM COMPLETE   No orders of the defined types were placed in this encounter.   Patient Instructions  Medication Instructions:  Your physician recommends that you continue on your current medications as directed. Please refer to the Current Medication list given to you today.  *If you need a refill on your cardiac medications before your next appointment, please call your pharmacy*  Lab Work: None If you have labs (blood work) drawn today and your tests are completely normal, you will receive your results only by: Marland Kitchen MyChart Message (if you have MyChart) OR . A paper copy in the mail If you have any lab test that is abnormal or we need to change your treatment, we will call you to review the results.  Testing/Procedures: Your physician has  requested that you have an echocardiogram. Echocardiography is a painless test that uses sound waves to create images of your heart. It provides your doctor with information about the size and shape of your heart and how well your heart's chambers and valves are working. This procedure takes approximately one hour. There are no restrictions for this procedure.   Follow-Up: At [facility], you and your health needs are our priority.  As part of our continuing mission to provide you with exceptional heart care, we have created designated Provider Care Teams.  These Care Teams include your primary Cardiologist (physician) and Advanced Practice Providers (APPs -  Physician Assistants and Nurse Practitioners) who all work together to provide you with the care you need, when you need it.  Your next appointment:   6 months-8 months  The format for your next appointment:   In Person  Provider:   You may see Dr. Daneen Schick or one of the following Advanced Practice Providers on your designated Care Team:    Truitt Merle, NP  Cecilie Kicks, NP  Kathyrn Drown, NP   Other Instructions      Signed, Sinclair Grooms, MD  01/06/2019 12:20 PM    [place]

## 2019-01-05 NOTE — Telephone Encounter (Signed)
Spoke with pt and he is HOH.  Made pt aware I will put a note in saying ok for wife to accompany.

## 2019-01-06 ENCOUNTER — Encounter: Payer: Self-pay | Admitting: Interventional Cardiology

## 2019-01-06 ENCOUNTER — Ambulatory Visit (INDEPENDENT_AMBULATORY_CARE_PROVIDER_SITE_OTHER): Payer: Medicare Other | Admitting: Interventional Cardiology

## 2019-01-06 ENCOUNTER — Other Ambulatory Visit: Payer: Self-pay

## 2019-01-06 VITALS — BP 118/66 | HR 64 | Ht 64.0 in | Wt 164.0 lb

## 2019-01-06 DIAGNOSIS — I10 Essential (primary) hypertension: Secondary | ICD-10-CM

## 2019-01-06 DIAGNOSIS — E785 Hyperlipidemia, unspecified: Secondary | ICD-10-CM

## 2019-01-06 DIAGNOSIS — I359 Nonrheumatic aortic valve disorder, unspecified: Secondary | ICD-10-CM

## 2019-01-06 DIAGNOSIS — I350 Nonrheumatic aortic (valve) stenosis: Secondary | ICD-10-CM | POA: Diagnosis not present

## 2019-01-06 DIAGNOSIS — I209 Angina pectoris, unspecified: Secondary | ICD-10-CM

## 2019-01-06 DIAGNOSIS — Z7189 Other specified counseling: Secondary | ICD-10-CM

## 2019-01-06 NOTE — Patient Instructions (Signed)
Medication Instructions:  Your physician recommends that you continue on your current medications as directed. Please refer to the Current Medication list given to you today.  *If you need a refill on your cardiac medications before your next appointment, please call your pharmacy*  Lab Work: None If you have labs (blood work) drawn today and your tests are completely normal, you will receive your results only by: Marland Kitchen MyChart Message (if you have MyChart) OR . A paper copy in the mail If you have any lab test that is abnormal or we need to change your treatment, we will call you to review the results.  Testing/Procedures: Your physician has requested that you have an echocardiogram. Echocardiography is a painless test that uses sound waves to create images of your heart. It provides your doctor with information about the size and shape of your heart and how well your heart's chambers and valves are working. This procedure takes approximately one hour. There are no restrictions for this procedure.   Follow-Up: At [facility], you and your health needs are our priority.  As part of our continuing mission to provide you with exceptional heart care, we have created designated Provider Care Teams.  These Care Teams include your primary Cardiologist (physician) and Advanced Practice Providers (APPs -  Physician Assistants and Nurse Practitioners) who all work together to provide you with the care you need, when you need it.  Your next appointment:   6 months-8 months  The format for your next appointment:   In Person  Provider:   You may see Dr. Daneen Schick or one of the following Advanced Practice Providers on your designated Care Team:    Truitt Merle, NP  Cecilie Kicks, NP  Kathyrn Drown, NP   Other Instructions

## 2019-01-11 DIAGNOSIS — H35373 Puckering of macula, bilateral: Secondary | ICD-10-CM | POA: Diagnosis not present

## 2019-01-11 DIAGNOSIS — D696 Thrombocytopenia, unspecified: Secondary | ICD-10-CM | POA: Diagnosis not present

## 2019-01-11 DIAGNOSIS — H43812 Vitreous degeneration, left eye: Secondary | ICD-10-CM | POA: Diagnosis not present

## 2019-01-11 DIAGNOSIS — Z961 Presence of intraocular lens: Secondary | ICD-10-CM | POA: Diagnosis not present

## 2019-01-11 DIAGNOSIS — C9140 Hairy cell leukemia not having achieved remission: Secondary | ICD-10-CM | POA: Diagnosis not present

## 2019-01-11 DIAGNOSIS — H353221 Exudative age-related macular degeneration, left eye, with active choroidal neovascularization: Secondary | ICD-10-CM | POA: Diagnosis not present

## 2019-02-01 ENCOUNTER — Other Ambulatory Visit: Payer: Self-pay | Admitting: Nurse Practitioner

## 2019-02-08 DIAGNOSIS — H43821 Vitreomacular adhesion, right eye: Secondary | ICD-10-CM | POA: Diagnosis not present

## 2019-02-08 DIAGNOSIS — H43812 Vitreous degeneration, left eye: Secondary | ICD-10-CM | POA: Diagnosis not present

## 2019-02-08 DIAGNOSIS — Z961 Presence of intraocular lens: Secondary | ICD-10-CM | POA: Diagnosis not present

## 2019-02-08 DIAGNOSIS — H353110 Nonexudative age-related macular degeneration, right eye, stage unspecified: Secondary | ICD-10-CM | POA: Diagnosis not present

## 2019-02-08 DIAGNOSIS — H353221 Exudative age-related macular degeneration, left eye, with active choroidal neovascularization: Secondary | ICD-10-CM | POA: Diagnosis not present

## 2019-02-08 DIAGNOSIS — H35373 Puckering of macula, bilateral: Secondary | ICD-10-CM | POA: Diagnosis not present

## 2019-03-03 ENCOUNTER — Other Ambulatory Visit: Payer: Self-pay | Admitting: Interventional Cardiology

## 2019-03-15 DIAGNOSIS — R972 Elevated prostate specific antigen [PSA]: Secondary | ICD-10-CM | POA: Diagnosis not present

## 2019-03-15 DIAGNOSIS — C9110 Chronic lymphocytic leukemia of B-cell type not having achieved remission: Secondary | ICD-10-CM | POA: Diagnosis not present

## 2019-03-15 DIAGNOSIS — C499 Malignant neoplasm of connective and soft tissue, unspecified: Secondary | ICD-10-CM | POA: Diagnosis not present

## 2019-03-15 DIAGNOSIS — D696 Thrombocytopenia, unspecified: Secondary | ICD-10-CM | POA: Diagnosis not present

## 2019-03-15 DIAGNOSIS — C9140 Hairy cell leukemia not having achieved remission: Secondary | ICD-10-CM | POA: Diagnosis not present

## 2019-03-15 DIAGNOSIS — N400 Enlarged prostate without lower urinary tract symptoms: Secondary | ICD-10-CM | POA: Diagnosis not present

## 2019-04-07 DIAGNOSIS — H353221 Exudative age-related macular degeneration, left eye, with active choroidal neovascularization: Secondary | ICD-10-CM | POA: Diagnosis not present

## 2019-04-07 DIAGNOSIS — Z961 Presence of intraocular lens: Secondary | ICD-10-CM | POA: Diagnosis not present

## 2019-04-07 DIAGNOSIS — H35412 Lattice degeneration of retina, left eye: Secondary | ICD-10-CM | POA: Diagnosis not present

## 2019-04-07 DIAGNOSIS — H43821 Vitreomacular adhesion, right eye: Secondary | ICD-10-CM | POA: Diagnosis not present

## 2019-04-07 DIAGNOSIS — H35373 Puckering of macula, bilateral: Secondary | ICD-10-CM | POA: Diagnosis not present

## 2019-04-07 DIAGNOSIS — H43812 Vitreous degeneration, left eye: Secondary | ICD-10-CM | POA: Diagnosis not present

## 2019-05-05 DIAGNOSIS — H353221 Exudative age-related macular degeneration, left eye, with active choroidal neovascularization: Secondary | ICD-10-CM | POA: Diagnosis not present

## 2019-05-05 DIAGNOSIS — H35412 Lattice degeneration of retina, left eye: Secondary | ICD-10-CM | POA: Diagnosis not present

## 2019-05-05 DIAGNOSIS — H43821 Vitreomacular adhesion, right eye: Secondary | ICD-10-CM | POA: Diagnosis not present

## 2019-05-05 DIAGNOSIS — H35363 Drusen (degenerative) of macula, bilateral: Secondary | ICD-10-CM | POA: Diagnosis not present

## 2019-05-05 DIAGNOSIS — H43812 Vitreous degeneration, left eye: Secondary | ICD-10-CM | POA: Diagnosis not present

## 2019-05-05 DIAGNOSIS — H353110 Nonexudative age-related macular degeneration, right eye, stage unspecified: Secondary | ICD-10-CM | POA: Diagnosis not present

## 2019-05-05 DIAGNOSIS — H35373 Puckering of macula, bilateral: Secondary | ICD-10-CM | POA: Diagnosis not present

## 2019-05-05 DIAGNOSIS — Z961 Presence of intraocular lens: Secondary | ICD-10-CM | POA: Diagnosis not present

## 2019-05-24 DIAGNOSIS — D2270 Melanocytic nevi of unspecified lower limb, including hip: Secondary | ICD-10-CM | POA: Diagnosis not present

## 2019-05-24 DIAGNOSIS — D485 Neoplasm of uncertain behavior of skin: Secondary | ICD-10-CM | POA: Diagnosis not present

## 2019-05-24 DIAGNOSIS — L820 Inflamed seborrheic keratosis: Secondary | ICD-10-CM | POA: Diagnosis not present

## 2019-05-24 DIAGNOSIS — L821 Other seborrheic keratosis: Secondary | ICD-10-CM | POA: Diagnosis not present

## 2019-05-24 DIAGNOSIS — D225 Melanocytic nevi of trunk: Secondary | ICD-10-CM | POA: Diagnosis not present

## 2019-05-24 DIAGNOSIS — D1801 Hemangioma of skin and subcutaneous tissue: Secondary | ICD-10-CM | POA: Diagnosis not present

## 2019-05-24 DIAGNOSIS — L570 Actinic keratosis: Secondary | ICD-10-CM | POA: Diagnosis not present

## 2019-05-24 DIAGNOSIS — C44310 Basal cell carcinoma of skin of unspecified parts of face: Secondary | ICD-10-CM | POA: Diagnosis not present

## 2019-05-24 DIAGNOSIS — D2260 Melanocytic nevi of unspecified upper limb, including shoulder: Secondary | ICD-10-CM | POA: Diagnosis not present

## 2019-05-24 DIAGNOSIS — L814 Other melanin hyperpigmentation: Secondary | ICD-10-CM | POA: Diagnosis not present

## 2019-05-24 DIAGNOSIS — Z85828 Personal history of other malignant neoplasm of skin: Secondary | ICD-10-CM | POA: Diagnosis not present

## 2019-05-24 DIAGNOSIS — L219 Seborrheic dermatitis, unspecified: Secondary | ICD-10-CM | POA: Diagnosis not present

## 2019-05-25 ENCOUNTER — Telehealth: Payer: Self-pay | Admitting: Interventional Cardiology

## 2019-05-25 NOTE — Telephone Encounter (Signed)
Spoke with pt and he was calling in about his f/u.  Advised he was due in early May.  Pt states his SOB is much worse than previous and felt like he might need to come in sooner.  Denies CP or other cardiac issues.  Worsening SOB with exertion.  When out walking, has to rest on inclines.  Denies swelling or BP issues.  Scheduled pt first available for echo on 06/13/19 and appt with Dr. Tamala Julian on 06/15/19.  Advised I will send message to Dr. Tamala Julian and I will call if any further recommendations, otherwise keep appts and we will eval at that time. Pt appreciative for call.

## 2019-05-25 NOTE — Telephone Encounter (Signed)
This is okay.

## 2019-05-25 NOTE — Telephone Encounter (Signed)
Patient called and wanted to speak with Dr. Thompson Caul Nurse Anderson [place]. Please call him back on his cell phone

## 2019-05-30 NOTE — Telephone Encounter (Signed)
Patient's wife states they are going Wednesday to the beach and will be staying about a week. They would like to know if he should be leaving town or if he should stay until after his appointment. She would like a nurse to call the patient to reassure him.

## 2019-05-30 NOTE — Telephone Encounter (Signed)
Spoke with pt and he has had no changes since we last spoke.  Feels comfortable going to the beach, wife was concerned.  Advised pt if any troubles while at the beach, seek medical attention.  Otherwise, keep appts with [place].  Pt appreciative for call.

## 2019-05-31 DIAGNOSIS — D649 Anemia, unspecified: Secondary | ICD-10-CM | POA: Diagnosis not present

## 2019-05-31 DIAGNOSIS — D696 Thrombocytopenia, unspecified: Secondary | ICD-10-CM | POA: Diagnosis not present

## 2019-06-09 DIAGNOSIS — H35371 Puckering of macula, right eye: Secondary | ICD-10-CM | POA: Diagnosis not present

## 2019-06-09 DIAGNOSIS — H353221 Exudative age-related macular degeneration, left eye, with active choroidal neovascularization: Secondary | ICD-10-CM | POA: Diagnosis not present

## 2019-06-09 DIAGNOSIS — H43812 Vitreous degeneration, left eye: Secondary | ICD-10-CM | POA: Diagnosis not present

## 2019-06-09 DIAGNOSIS — H353111 Nonexudative age-related macular degeneration, right eye, early dry stage: Secondary | ICD-10-CM | POA: Diagnosis not present

## 2019-06-13 ENCOUNTER — Other Ambulatory Visit: Payer: Self-pay

## 2019-06-13 ENCOUNTER — Ambulatory Visit (HOSPITAL_COMMUNITY): Payer: Medicare Other | Attending: Internal Medicine

## 2019-06-13 DIAGNOSIS — I359 Nonrheumatic aortic valve disorder, unspecified: Secondary | ICD-10-CM | POA: Insufficient documentation

## 2019-06-14 NOTE — Progress Notes (Signed)
Cardiology Office Note:    Date:  06/15/2019   ID:  [name], DOB June 22, 1923, MRN [id number]  PCP:  [name], MD  Cardiologist:  Sinclair Grooms, MD   Referring MD: [name], MD   No chief complaint on file.   History of Present Illness:    [name] is a 86 y.o. male with a hx of calcific aortic stenosis, chronic coronary artery disease with overlapping DES and mid RCA 2016, residual LAD disease, hyperlipidemia, chronic diastolic heart failure, and Harry cell leukemia with chronic anemia and  thrombocytopenia.  [name] is having exertional dyspnea, fatigue, and exhaustion with activities that could be done relatively easily 6 to 12 months ago.  Aortic stenosis was noted to be moderate when last evaluated 1 year ago by echocardiography when the peak velocity was 3.7 m/s.  Because of increasing symptoms, the echo was repeated on June 13, 2019 and velocity documented to be 4.1 m/s with a mean gradient of 43 mmHg and estimated peak gradient of 67 mmHg.  Because of the patient's age, we had a conversation concerning treatment options.  He is relatively fit despite his history of hairy cell leukemia with chronic anemia and thrombocytopenia.  His recent hemoglobin was 12 and platelet count 43,000 on May 31, 2019.  Both of the hemoglobin and platelet count is slightly lower than they were in December 2020.  He denies angina, orthopnea, syncope, and peripheral edema.  He has a history of coronary artery disease with with tandem stents placed in the right coronary in 2016.  Known total occlusion of the distal LAD with collaterals.  Past Medical History:  Diagnosis Date  . Acute appendicitis   . Anemia   . Anxiety   . Arthritis    "back" (03/14/2014)  . CAD (coronary artery disease)    a. 03/14/14  s/p overlapping DES x2 to mid-distal RCA.  . 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 Colon cancer ([place]) 1984  . Compression fracture of lumbar  spine, non-traumatic ([place])   . DJD (degenerative joint disease) of lumbar spine   . Dyslipidemia   . Elevated PSA   . GERD (gastroesophageal reflux disease)   . Glaucoma   . Hairy cell leukemia ([place]) dx'd 1980  . History of blood transfusion    "several; related to hairy cell leukemia & tx "  . History of stomach ulcers 1968  . Hypertension   . Malnutrition ([place])   . Osteoarthritis   . Osteoporosis   . Skin cancer of face     Past Surgical History:  Procedure Laterality Date  . APPENDECTOMY  10/2007  . CATARACT EXTRACTION, BILATERAL Bilateral 02/2008  . COLON SURGERY  1984   "sigmoid; open"  . CORONARY ANGIOPLASTY WITH STENT PLACEMENT  03/14/2014   "2"  . FRACTURE SURGERY    . LEFT HEART CATHETERIZATION WITH CORONARY ANGIOGRAM N/A 03/14/2014   Procedure: LEFT HEART CATHETERIZATION WITH CORONARY ANGIOGRAM;  Surgeon: Peter M Martinique, MD;  Location[facility] CATH LAB;  Service: Cardiovascular;  Laterality: N/A;  . LIPOMA EXCISION Right 07/2011   liposarcoma resection; "back"  . MOHS SURGERY Left 02/2011  . MOHS SURGERY  X 3   "all on my face"  . MOLE REMOVAL Left 1985   cheek  . ORIF ANKLE FRACTURE Left 2000  . SHOULDER SURGERY  04/2004  . SPLENECTOMY  1992  . TONSILLECTOMY AND ADENOIDECTOMY  1939    Current Medications: Current Meds  Medication Sig  .  atorvastatin (LIPITOR) 20 MG tablet TAKE 1 TABLET BY MOUTH DAILY AT 6 PM  . Cholecalciferol (VITAMIN D-3 PO) Take 1,000 Units by mouth daily.   Marland Kitchen desonide (DESOWEN) 0.05 % cream Apply 1 application topically 2 (two) times daily as needed. (Skin Irritation)  . fluocinonide (LIDEX) 0.05 % external solution APP TOPICALLY TO SCALP AND EARS twice daily as needed FOR DERMATITIS  . LATANOPROST OP Apply 1 drop to eye at bedtime. In each eye  . metoprolol tartrate (LOPRESSOR) 25 MG tablet TAKE ONE-HALF TABLET BY MOUTH DAILY PLEASE KEEP YOUR UPCOMING APPOINTMENT WITH DOCTOR Madilyne Tadlock IN [place] FOR FUTURE REFILLS. THANK YOU  . Multiple  Vitamins-Minerals (PRESERVISION AREDS 2 PO) Take 1 capsule by mouth 2 (two) times daily.   . nitroGLYCERIN (NITROSTAT) 0.4 MG SL tablet Place 1 tablet (0.4 mg total) under the tongue every 5 (five) minutes as needed for chest pain. Please make yearly appt with Dr. Tamala Julian for September. 1st attempt  . ranibizumab (LUCENTIS) 0.5 MG/0.05ML SOLN Inject 0.5 mg into the LEFT eye every four (4) weeks.  . sertraline (ZOLOFT) 50 MG tablet Take 50 mg by mouth daily.  . tamsulosin (FLOMAX) 0.4 MG CAPS capsule Take 0.8 mg by mouth daily after supper.   . timolol (BETIMOL) 0.5 % ophthalmic solution Place 1 drop into the right eye daily.      Allergies:   Antazoline; Antihistamines, chlorpheniramine-type; and Sulfa antibiotics   Social History   Socioeconomic History  . Marital status: Married    Spouse name: Not on file  . Number of children: Not on file  . Years of education: Not on file  . Highest education level: Not on file  Occupational History  . Not on file  Tobacco Use  . Smoking status: Former Smoker    Packs/day: 0.25    Years: 1.00    Pack years: 0.25    Types: Cigarettes, Cigars  . Smokeless tobacco: Never Used  . Tobacco comment: occasional social smoker during college.  Substance and Sexual Activity  . Alcohol use: Yes    Alcohol/week: 9.0 standard drinks    Types: 2 Glasses of wine, 2 Shots of liquor, 5 Standard drinks or equivalent per week  . Drug use: No  . Sexual activity: Never  Other Topics Concern  . Not on file  Social History Narrative  . Not on file   Social Determinants of Health   Financial Resource Strain:   . Difficulty of Paying Living Expenses:   Food Insecurity:   . Worried About Charity fundraiser in the Last Year:   . Arboriculturist in the Last Year:   Transportation Needs:   . Film/video editor (Medical):   Marland Kitchen Lack of Transportation (Non-Medical):   Physical Activity:   . Days of Exercise per Week:   . Minutes of Exercise per Session:     Stress:   . Feeling of Stress :   Social Connections:   . Frequency of Communication with Friends and Family:   . Frequency of Social Gatherings with Friends and Family:   . Attends Religious Services:   . Active Member of Clubs or Organizations:   . Attends Archivist Meetings:   Marland Kitchen Marital Status:      Family History: The patient's family history includes Congestive Heart Failure (age of onset: 6) in his father; Diabetes Mellitus II in his brother; Heart Problems in his brother; Prostate cancer in his brother; Stroke in his mother.  ROS:  Please see the history of present illness.    Numbness, stiffness, and weakness in his legs.  Has a difficult time arising from sitting position.  No difficulty with ambulation or worsening in leg fatigue with ambulation.  Has lumbar disc disease.  May have spinal stenosis.  Surgery greater than 40 years ago.  All other systems reviewed and are negative.  EKGs/Labs/Other Studies Reviewed:    The following studies were reviewed today:  ECHOCARDIOGRAPHY 06/13/2019: IMPRESSIONS    1. Left ventricular ejection fraction, by estimation, is 60 to 65%. The  left ventricle has normal function. The left ventricle has no regional  wall motion abnormalities. Left ventricular diastolic parameters are  consistent with Grade I diastolic  dysfunction (impaired relaxation). Elevated left atrial pressure.  2. Right ventricular systolic function is normal. The right ventricular  size is normal. There is mildly elevated pulmonary artery systolic  pressure.  3. Left atrial size was mildly dilated.  4. The mitral valve is abnormal. Mild mitral valve regurgitation.  5. AV is thickened, calcified with restricted motion. Peak and mean  gradients through the valve are 67 and 43 mm Hg respectively. Thisk in  increased from echo report of 2020. LVOT/AV VTI ratio is 024 consistent  with severe AS.Marland Kitchen The aortic valve is  tricuspid. Aortic valve  regurgitation is moderate.  6. Aortic dilatation noted. There is mild dilatation of the ascending  aorta measuring 39 mm.    Cardiac catheterization January 2016: Final Conclusions:   1. Severe 2 vessel obstructive CAD. The patient has chronic total occlusion of the distal LAD with collaterals. There is a severe lesion in the LAD after the second diagonal but this lesion supplies only a small third diagonal branch. The mid RCA had a subtotal occlusion. 2. Normal LV function 3.  PCI:PCI Data: Vessel - RCA/Segment - mid and distal Percent Stenosis (pre)  99% and 80% TIMI-flow 1 Stent 2.5 x 20 mm Promus and 2.25 x 16 mm Promus respectively Percent Stenosis (post) 0% TIMI-flow (post) 3  EKG:  EKG sinus bradycardia, left atrial abnormality, PR interval of 186 ms, right bundle branch block, left anterior hemiblock.  No change when compared to the prior tracing from January 06, 2019.  Recent Labs: No results found for requested labs within last 8760 hours.  Recent Lipid Panel    Component Value Date/Time   CHOL 122 (L) 12/10/2015 0949   TRIG 154 (H) 12/10/2015 0949   HDL 33 (L) 12/10/2015 0949   CHOLHDL 3.7 12/10/2015 0949   VLDL 31 (H) 12/10/2015 0949   LDLCALC 58 12/10/2015 0949    Physical Exam:    VS:  BP 128/68   Pulse 68   Ht 5\' 4"  (1.626 m)   Wt 167 lb 12.8 oz (76.1 kg)   SpO2 96%   BMI 28.80 kg/m     Wt Readings from Last 3 Encounters:  06/15/19 167 lb 12.8 oz (76.1 kg)  01/06/19 164 lb (74.4 kg)  11/20/17 170 lb 3.2 oz (77.2 kg)     GEN: Appears younger than stated age.  He is ambulatory without difficulty.. No acute distress HEENT: Normal NECK: No JVD. LYMPHATICS: No lymphadenopathy CARDIAC: 3/6 to 4/6 crescendo decrescendo systolic murmur compatible with aortic stenosis.  RRR without no diastolic murmur, gallop, or edema. VASCULAR:  Normal Pulses. No bruits. RESPIRATORY:  Clear to auscultation without rales, wheezing or rhonchi  ABDOMEN: Soft, non-tender,  non-distended, No pulsatile mass, MUSCULOSKELETAL: No deformity  SKIN: Warm and dry NEUROLOGIC:  Alert and oriented x 3 PSYCHIATRIC:  Normal affect   ASSESSMENT:    1. Severe aortic stenosis   2. Angina pectoris ([place])   3. Dyslipidemia   4. Essential hypertension   5. Thrombocytopenia ([place])   6. Educated about COVID-19 virus infection   7. Bifascicular block    PLAN:    In order of problems listed above:  1. Symptomatic aortic stenosis.  After significant candid conversation with the patient, we will pursue transaortic valve replacement therapy if possible.  He is 86 years of age but quite fit and still independently engages in physical activity such as boating, walking for exercise, etc.  He would not be interested and open heart surgery.  Left and right heart cath with coronary angiography will be performed prior to referral for consideration of TAVR. 2. Coronary angiography will reassess coronary anatomy.  Known total occlusion of the distal LAD.  5 years ago had tandem stents placed in the right coronary for high-grade disease with success.  There is no current angina. 3. LDL target is less than 70 with most recent LDL of 54. 4. Target blood pressure 140/80 mmHg or less.  He is on ultra low-dose metoprolol. 5. He has hairy cell leukemia with anemia and thrombocytopenia.  Last platelet count was less than 50,000.  I will need to contact hematology/oncology at [facility] to get an opinion concerning both anemia and thrombocytopenia preparation for TAVR. 6. He has received the COVID-19 vaccine.  He is practicing social distancing. 7. Discussed the increased risk of high-grade AV block requiring pacemaker after TAVR.  Overall education and awareness concerning primary/secondary risk prevention was discussed in detail: LDL less than 70, hemoglobin A1c less than 7, blood pressure target less than 130/80 mmHg, >150 minutes of moderate aerobic activity per week, avoidance  of smoking, weight control (via diet and exercise), and continued surveillance/management of/for obstructive sleep apnea.  The patient was counseled to undergo left heart catheterization, coronary angiography, and possible percutaneous coronary intervention with stent implantation. The procedural risks and benefits were discussed in detail. The risks discussed included death, stroke, myocardial infarction, life-threatening bleeding, limb ischemia, kidney injury, allergy, and possible emergency cardiac surgery. The risk of these significant complications were estimated to occur less than 1% of the time. After discussion, the patient has agreed to proceed.  Natural history of aortic valve stenosis was discussed in detail.  Cardinal symptoms of angina, syncope, and dyspnea were reviewed and significance relative to prognosis was described.  The importance of sequential imaging for disease monitoring was emphasized.  Work-up including possible heart catheterization and CT angiography were described as essential components of staging for therapy.  Treatment options, TAVR and SAVR, were discussed in some detail with emphasis on TAVR.  With TAVR he will be at increased risk for pacemaker requirement.     Medication Adjustments/Labs and Tests Ordered: Current medicines are reviewed at length with the patient today.  Concerns regarding medicines are outlined above.  Orders Placed This Encounter  Procedures  . Basic metabolic panel  . CBC   No orders of the defined types were placed in this encounter.   Patient Instructions  Medication Instructions:  Your physician recommends that you continue on your current medications as directed. Please refer to the Current Medication list given to you today.  *If you need a refill on your cardiac medications before your next appointment, please call your pharmacy*   Lab Work: BMET and CBC today  If you have  labs (blood work) drawn today and your tests are  completely normal, you will receive your results only by: Marland Kitchen MyChart Message (if you have MyChart) OR . A paper copy in the mail If you have any lab test that is abnormal or we need to change your treatment, we will call you to review the results.   Testing/Procedures: Your physician has requested that you have a cardiac catheterization. Cardiac catheterization is used to diagnose and/or treat various heart conditions. Doctors may recommend this procedure for a number of different reasons. The most common reason is to evaluate chest pain. Chest pain can be a symptom of coronary artery disease (CAD), and cardiac catheterization can show whether plaque is narrowing or blocking your heart's arteries. This procedure is also used to evaluate the valves, as well as measure the blood flow and oxygen levels in different parts of your heart. For further information please visit HugeFiesta.[email or web address]. Please follow instruction sheet, as given.    Follow-Up: At [facility], you and your health needs are our priority.  As part of our continuing mission to provide you with exceptional heart care, we have created designated Provider Care Teams.  These Care Teams include your primary Cardiologist (physician) and Advanced Practice Providers (APPs -  Physician Assistants and Nurse Practitioners) who all work together to provide you with the care you need, when you need it.  We recommend signing up for the patient portal called "MyChart".  Sign up information is provided on this After Visit Summary.  MyChart is used to connect with patients for Virtual Visits (Telemedicine).  Patients are able to view lab/test results, encounter notes, upcoming appointments, etc.  Non-urgent messages can be sent to your provider as well.   To learn more about what you can do with MyChart, go to [email or web address].    Your next appointment:   1-2 week(s) after your heart cath to discuss TAVR  The format for your next  appointment:   In Person  Provider:   Sherren Mocha, MD or Lauree Chandler, MD   Other Instructions     [place] OFFICE [place], [place] [address] Dept: [phone] Loc: [place]  06/15/2019  You are scheduled for a Cardiac Catheterization on Wednesday, April 28 with Dr. Daneen Schick.  1. Please arrive at the [facility] (Main Entrance A) at [facility]: [address] [place], [place] [address] at 6:30 AM (This time is two hours before your procedure to ensure your preparation). Free valet parking service is available.   Special note: Every effort is made to have your procedure done on time. Please understand that emergencies sometimes delay scheduled procedures.  2. Diet: Do not eat solid foods after midnight.  The patient may have clear liquids until 5am upon the day of the procedure.  3. Labs: You will need to have labs drawn today  4. Medication instructions in preparation for your procedure:   Contrast Allergy: No  On the morning of your procedure, take your Aspirin and any morning medicines NOT listed above.  You may use sips of water.  5. Plan for one night stay--bring personal belongings. 6. Bring a current list of your medications and current insurance cards. 7. You MUST have a responsible person to drive you home. 8. Someone MUST be with you the first 24 hours after you arrive home or your discharge will be delayed. 9. Please wear  clothes that are easy to get on and off and wear slip-on shoes.  Thank you for allowing [place] to care for you!   -- [place] [facility]     Signed, Sinclair Grooms, MD  06/15/2019 1:02 PM    [place]

## 2019-06-15 ENCOUNTER — Other Ambulatory Visit: Payer: Self-pay

## 2019-06-15 ENCOUNTER — Ambulatory Visit (INDEPENDENT_AMBULATORY_CARE_PROVIDER_SITE_OTHER): Payer: Medicare Other | Admitting: Interventional Cardiology

## 2019-06-15 ENCOUNTER — Encounter: Payer: Self-pay | Admitting: Interventional Cardiology

## 2019-06-15 VITALS — BP 128/68 | HR 68 | Ht 64.0 in | Wt 167.8 lb

## 2019-06-15 DIAGNOSIS — Z7189 Other specified counseling: Secondary | ICD-10-CM | POA: Diagnosis not present

## 2019-06-15 DIAGNOSIS — I452 Bifascicular block: Secondary | ICD-10-CM

## 2019-06-15 DIAGNOSIS — I10 Essential (primary) hypertension: Secondary | ICD-10-CM

## 2019-06-15 DIAGNOSIS — E785 Hyperlipidemia, unspecified: Secondary | ICD-10-CM

## 2019-06-15 DIAGNOSIS — I350 Nonrheumatic aortic (valve) stenosis: Secondary | ICD-10-CM | POA: Diagnosis not present

## 2019-06-15 DIAGNOSIS — I209 Angina pectoris, unspecified: Secondary | ICD-10-CM | POA: Diagnosis not present

## 2019-06-15 DIAGNOSIS — D696 Thrombocytopenia, unspecified: Secondary | ICD-10-CM

## 2019-06-15 LAB — CBC
Hematocrit: 38.2 % (ref 37.5–51.0)
Hemoglobin: 12.6 g/dL — ABNORMAL LOW (ref 13.0–17.7)
MCH: 27.8 pg (ref 26.6–33.0)
MCHC: 33 g/dL (ref 31.5–35.7)
MCV: 84 fL (ref 79–97)
Platelets: 35 10*3/uL — CL (ref 150–450)
RBC: 4.53 x10E6/uL (ref 4.14–5.80)
RDW: 14.8 % (ref 11.6–15.4)
WBC: 7.4 10*3/uL (ref 3.4–10.8)

## 2019-06-15 LAB — BASIC METABOLIC PANEL
BUN/Creatinine Ratio: 17 (ref 10–24)
BUN: 19 mg/dL (ref 10–36)
CO2: 27 mmol/L (ref 20–29)
Calcium: 8.8 mg/dL (ref 8.6–10.2)
Chloride: 106 mmol/L (ref 96–106)
Creatinine, Ser: 1.11 mg/dL (ref 0.76–1.27)
GFR calc Af Amer: 66 mL/min/{1.73_m2} (ref >59)
GFR calc non Af Amer: 57 mL/min/{1.73_m2} — ABNORMAL LOW (ref >59)
Glucose: 94 mg/dL (ref 65–99)
Potassium: 4.2 mmol/L (ref 3.5–5.2)
Sodium: 138 mmol/L (ref 134–144)

## 2019-06-15 NOTE — Patient Instructions (Signed)
Medication Instructions:  Your physician recommends that you continue on your current medications as directed. Please refer to the Current Medication list given to you today.  *If you need a refill on your cardiac medications before your next appointment, please call your pharmacy*   Lab Work: BMET and CBC today  If you have labs (blood work) drawn today and your tests are completely normal, you will receive your results only by: Marland Kitchen MyChart Message (if you have MyChart) OR . A paper copy in the mail If you have any lab test that is abnormal or we need to change your treatment, we will call you to review the results.   Testing/Procedures: Your physician has requested that you have a cardiac catheterization. Cardiac catheterization is used to diagnose and/or treat various heart conditions. Doctors may recommend this procedure for a number of different reasons. The most common reason is to evaluate chest pain. Chest pain can be a symptom of coronary artery disease (CAD), and cardiac catheterization can show whether plaque is narrowing or blocking your heart's arteries. This procedure is also used to evaluate the valves, as well as measure the blood flow and oxygen levels in different parts of your heart. For further information please visit HugeFiesta.[email or web address]. Please follow instruction sheet, as given.    Follow-Up: At [facility], you and your health needs are our priority.  As part of our continuing mission to provide you with exceptional heart care, we have created designated Provider Care Teams.  These Care Teams include your primary Cardiologist (physician) and Advanced Practice Providers (APPs -  Physician Assistants and Nurse Practitioners) who all work together to provide you with the care you need, when you need it.  We recommend signing up for the patient portal called "MyChart".  Sign up information is provided on this After Visit Summary.  MyChart is used to connect with patients  for Virtual Visits (Telemedicine).  Patients are able to view lab/test results, encounter notes, upcoming appointments, etc.  Non-urgent messages can be sent to your provider as well.   To learn more about what you can do with MyChart, go to [email or web address].    Your next appointment:   1-2 week(s) after your heart cath to discuss TAVR  The format for your next appointment:   In Person  Provider:   Sherren Mocha, MD or Lauree Chandler, MD   Other Instructions     [place] OFFICE [address], [place] [address] Dept: [phone] Loc: [place]  06/15/2019  You are scheduled for a Cardiac Catheterization on Wednesday, April 28 with Dr. Daneen Schick.  1. Please arrive at the [facility] (Main Entrance A) at [facility]: [address] [place], [place] [address] at 6:30 AM (This time is two hours before your procedure to ensure your preparation). Free valet parking service is available.   Special note: Every effort is made to have your procedure done on time. Please understand that emergencies sometimes delay scheduled procedures.  2. Diet: Do not eat solid foods after midnight.  The patient may have clear liquids until 5am upon the day of the procedure.  3. Labs: You will need to have labs drawn today  4. Medication instructions in preparation for your procedure:   Contrast Allergy: No  On the morning of your procedure, take your Aspirin and any morning medicines NOT listed above.  You may use sips of water.  5. Plan for one night stay--bring personal belongings. 6. Bring a current list of your medications and current insurance cards. 7. You MUST have a responsible person to drive you home. 8. Someone MUST be with you the first 24 hours after you arrive home or your discharge will be delayed. 9. Please wear clothes that are easy to get on  and off and wear slip-on shoes.  Thank you for allowing [place] to care for you!   -- [place] [facility]

## 2019-06-16 NOTE — Addendum Note (Signed)
Addended by: Carylon Perches on: 06/16/2019 02:04 PM   Modules accepted: Orders

## 2019-06-17 ENCOUNTER — Telehealth: Payer: Self-pay | Admitting: Interventional Cardiology

## 2019-06-17 NOTE — Telephone Encounter (Signed)
Called hematology at [facility] to speak with Douglas Evener NP concerning [name].  He needs cardiac catheterization.  Precath studies demonstrated a platelet count of 35,000.  I am calling to make the hematology clinic aware and to receive some assistance in and or recommendations to elevate the platelet count above 50,000 to allow percutaneous procedure.  [name] was not available.  I left a detailed message with [name], phone [phone] option #2.  She will send a message to [name].  If I have not heard back from her by the end of the workday on Monday, she requests that I return the phone call.

## 2019-06-21 ENCOUNTER — Other Ambulatory Visit: Payer: Self-pay | Admitting: *Deleted

## 2019-06-21 DIAGNOSIS — D696 Thrombocytopenia, unspecified: Secondary | ICD-10-CM

## 2019-06-21 NOTE — Telephone Encounter (Signed)
06/21/2019 to triage nurse Fransisco Beau.  Still unable to speak with Virgina Evener, NP who is in a room with the patient.  My cell phone numbers given and hopefully she will call back shortly.

## 2019-06-22 ENCOUNTER — Telehealth: Payer: Self-pay | Admitting: *Deleted

## 2019-06-22 DIAGNOSIS — C9140 Hairy cell leukemia not having achieved remission: Secondary | ICD-10-CM

## 2019-06-22 NOTE — Telephone Encounter (Signed)
Called patient and provided lab/OV for 06/29/19 at 0800/0830. He will bring his wife to appointment due to being very [place].

## 2019-06-27 ENCOUNTER — Other Ambulatory Visit (HOSPITAL_COMMUNITY): Payer: Medicare Other

## 2019-06-29 ENCOUNTER — Inpatient Hospital Stay (HOSPITAL_BASED_OUTPATIENT_CLINIC_OR_DEPARTMENT_OTHER): Payer: Medicare Other | Admitting: Oncology

## 2019-06-29 ENCOUNTER — Telehealth: Payer: Self-pay | Admitting: Oncology

## 2019-06-29 ENCOUNTER — Ambulatory Visit (HOSPITAL_COMMUNITY)
Admission: RE | Admit: 2019-06-29 | Payer: Medicare Other | Source: Home / Self Care | Admitting: Interventional Cardiology

## 2019-06-29 ENCOUNTER — Other Ambulatory Visit: Payer: Self-pay

## 2019-06-29 ENCOUNTER — Encounter (HOSPITAL_COMMUNITY): Admission: RE | Payer: Self-pay | Source: Home / Self Care

## 2019-06-29 ENCOUNTER — Inpatient Hospital Stay: Payer: Medicare Other | Attending: Oncology

## 2019-06-29 ENCOUNTER — Telehealth: Payer: Self-pay | Admitting: *Deleted

## 2019-06-29 VITALS — BP 148/64 | HR 63 | Temp 98.7°F | Resp 17 | Ht 64.0 in | Wt 166.7 lb

## 2019-06-29 DIAGNOSIS — Z7289 Other problems related to lifestyle: Secondary | ICD-10-CM

## 2019-06-29 DIAGNOSIS — C9141 Hairy cell leukemia, in remission: Secondary | ICD-10-CM

## 2019-06-29 DIAGNOSIS — Z9081 Acquired absence of spleen: Secondary | ICD-10-CM | POA: Insufficient documentation

## 2019-06-29 DIAGNOSIS — Z85828 Personal history of other malignant neoplasm of skin: Secondary | ICD-10-CM | POA: Insufficient documentation

## 2019-06-29 DIAGNOSIS — R911 Solitary pulmonary nodule: Secondary | ICD-10-CM | POA: Diagnosis not present

## 2019-06-29 DIAGNOSIS — C44319 Basal cell carcinoma of skin of other parts of face: Secondary | ICD-10-CM | POA: Insufficient documentation

## 2019-06-29 DIAGNOSIS — H9190 Unspecified hearing loss, unspecified ear: Secondary | ICD-10-CM

## 2019-06-29 DIAGNOSIS — I2510 Atherosclerotic heart disease of native coronary artery without angina pectoris: Secondary | ICD-10-CM | POA: Insufficient documentation

## 2019-06-29 DIAGNOSIS — I350 Nonrheumatic aortic (valve) stenosis: Secondary | ICD-10-CM

## 2019-06-29 DIAGNOSIS — D696 Thrombocytopenia, unspecified: Secondary | ICD-10-CM | POA: Diagnosis not present

## 2019-06-29 DIAGNOSIS — C9140 Hairy cell leukemia not having achieved remission: Secondary | ICD-10-CM

## 2019-06-29 LAB — CBC WITH DIFFERENTIAL (CANCER CENTER ONLY)
Abs Immature Granulocytes: 0.09 10*3/uL — ABNORMAL HIGH (ref 0.00–0.07)
Basophils Absolute: 0.1 10*3/uL (ref 0.0–0.1)
Basophils Relative: 1 %
Eosinophils Absolute: 0.2 10*3/uL (ref 0.0–0.5)
Eosinophils Relative: 2 %
HCT: 38.5 % — ABNORMAL LOW (ref 39.0–52.0)
Hemoglobin: 12.3 g/dL — ABNORMAL LOW (ref 13.0–17.0)
Immature Granulocytes: 1 %
Lymphocytes Relative: 16 %
Lymphs Abs: 1.2 10*3/uL (ref 0.7–4.0)
MCH: 27.3 pg (ref 26.0–34.0)
MCHC: 31.9 g/dL (ref 30.0–36.0)
MCV: 85.6 fL (ref 80.0–100.0)
Monocytes Absolute: 1.8 10*3/uL — ABNORMAL HIGH (ref 0.1–1.0)
Monocytes Relative: 23 %
Neutro Abs: 4.4 10*3/uL (ref 1.7–7.7)
Neutrophils Relative %: 57 %
Platelet Count: 50 10*3/uL — ABNORMAL LOW (ref 150–400)
RBC: 4.5 MIL/uL (ref 4.22–5.81)
RDW: 15.4 % (ref 11.5–15.5)
WBC Count: 7.8 10*3/uL (ref 4.0–10.5)
nRBC: 0 % (ref 0.0–0.2)

## 2019-06-29 LAB — SAVE SMEAR(SSMR), FOR PROVIDER SLIDE REVIEW

## 2019-06-29 SURGERY — RIGHT/LEFT HEART CATH AND CORONARY ANGIOGRAPHY
Anesthesia: LOCAL

## 2019-06-29 MED ORDER — PREDNISONE 10 MG PO TABS: 40.0000 mg | ORAL_TABLET | Freq: Every day | ORAL | 0 refills | Status: DC

## 2019-06-29 NOTE — Telephone Encounter (Signed)
Left VM that prednisone has been sent to his pharmacy. Take 40 mg daily in the morning with food. Suggested he start tomorrow.

## 2019-06-29 NOTE — Telephone Encounter (Signed)
Scheduled per los. Gave avs and calendar  

## 2019-06-29 NOTE — Progress Notes (Signed)
[name]   Requesting MD: [name], [address] [place],  [place] [address]   [name] 86 y.o.  [birth year]     Reason for [name]: Thrombocytopenia, history of hairy cell leukemia   HPI: [name] has a remote history of hairy cell leukemia originally managed by [name] at [facility].  He was last seen in the hematology clinic here many years ago.  [name] underwent a splenectomy in 78.  He has not received additional treatment for hairy cell leukemia and has remained in clinical remission.  He developed thrombocytopenia beginning in October 2017 when the platelet count was noted to be mildly decreased at 125,000.  The platelet count has been in the 40-50000 range for the past few years.  A bone marrow biopsy in May 2018 was negative for hairy cell leukemia.  No etiology for the thrombocytopenia was confirmed on the bone marrow biopsy.  [name] was referred to [name] in August of last year.  She felt the thrombocytopenia was potentially related to ITP with the differential diagnosis including paraneoplastic ITP and thrombocytopenia related to liver disease.  A DIC screen was negative.  A liver ultrasound revealed normal echogenicity.  Small platelet clumps were noted on the blood smear with a manual platelet count of 80,000.  A CBC at [facility] on 05/31/2019 found the platelets at 43,000 with an elevated immature platelet fraction.  [name] saw [name] on 06/15/2019.  He was diagnosed with symptomatic aortic stenosis.  A left and right heart catheterization is recommended prior to referral for a TAVR procedure.  [name] is referred for hematology evaluation prior to undergoing a cardiac catheterization procedure.  Past Medical History:  Diagnosis Date  . Acute appendicitis   . Anemia   . Anxiety   . Arthritis    "back" (03/14/2014)  . CAD (coronary artery disease)    a. 03/14/14  s/p overlapping DES x2 to mid-distal  RCA.  . 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 Colon cancer ([place]) 1984  . Compression fracture of lumbar spine, non-traumatic ([place])   . DJD (degenerative joint disease) of lumbar spine   . Dyslipidemia   . Elevated PSA-felt to have prostate cancer, followed with observation   . GERD (gastroesophageal reflux disease)   . Glaucoma   . Hairy cell leukemia ([place]) dx'd 1980  . History of blood transfusion    "several; related to hairy cell leukemia & tx "  . History of stomach ulcers 1968  . Hypertension   . Malnutrition ([place])   . Osteoarthritis   . Osteoporosis   . Skin cancer of face     .  Liposarcoma right axillary chest wall May 2013   .  Aortic stenosis   .  Macular degeneration-treated with ranibizumab at [facility]   .  Left pulmonary nodule-mildly FDG avid and measuring 2.9 cm in March 2015  Past Surgical History:  Procedure Laterality Date  . APPENDECTOMY  10/2007  . CATARACT EXTRACTION, BILATERAL Bilateral 02/2008  . COLON SURGERY  1984   "sigmoid; open"  . CORONARY ANGIOPLASTY WITH STENT PLACEMENT  03/14/2014   "2"  . FRACTURE SURGERY    . LEFT HEART CATHETERIZATION WITH CORONARY ANGIOGRAM N/A 03/14/2014   Procedure: LEFT HEART CATHETERIZATION WITH CORONARY ANGIOGRAM;  Surgeon: Douglas M Martinique, MD;  Location: [facility] CATH LAB;  Service: Cardiovascular;  Laterality: N/A;  . LIPOMA EXCISION Right 07/2011   liposarcoma resection; "back"  .  MOHS SURGERY Left 02/2011  . MOHS SURGERY  X 3   "all on my face"  . MOLE REMOVAL Left 1985   cheek  . ORIF ANKLE FRACTURE Left 2000  . SHOULDER SURGERY  04/2004  . SPLENECTOMY  1992  . TONSILLECTOMY AND ADENOIDECTOMY  1939    Medications: Reviewed  Allergies:  Allergies  Allergen Reactions  . Antazoline Anaphylaxis  . Antihistamines, Chlorpheniramine-Type     Nervous, jittery  . Sulfa Antibiotics Rash     Social History:   He lives with his wife in [place].  He is retired from a business occupation.  He does not  use cigarettes.  He reports social alcohol use.  ROS:   Positives include: Urinary hesitancy, nocturia, nodule at the left arm, dyspnea on exertion  A complete ROS was otherwise negative.  Physical Exam:  Blood pressure (!) 148/64, pulse 63, temperature 98.7 F (37.1 C), temperature source Temporal, resp. rate 17, height _0  (1.626 m), weight 166 lb 11.2 oz (75.6 kg), SpO2 97 %.  HEENT: Neck without mass Lungs: Clear bilaterally Cardiac: Regular rate and rhythm, 2/6 systolic murmur Abdomen: No hepatomegaly, no mass, nontender  Vascular: Trace lower leg edema bilaterally Lymph nodes: No cervical, supraclavicular, axillary, or inguinal nodes Neurologic: Alert and oriented, the motor exam appears intact in the upper and lower extremities bilaterally Skin: Multiple benign-appearing moles over the trunk Musculoskeletal: No spine tenderness   LAB:  CBC  Lab Results  Component Value Date   WBC 7.8 06/29/2019   HGB 12.3 (L) 06/29/2019   HCT 38.5 (L) 06/29/2019   MCV 85.6 06/29/2019   PLT 50 (L) 06/29/2019   NEUTROABS 4.4 06/29/2019    Blood smear 06/29/2019-the platelets are decreased in number.  No platelet clumps are seen.  Many of the platelets are large.  The white cell morphology is unremarkable, no blasts, monotonous population, or other young forms are present.  There are numerous acanthocytes, ovalocytes, teardrops, and rare schistocytes.  Few Howell-Jolly bodies.   CMP  Lab Results  Component Value Date   NA 138 06/15/2019   K 4.2 06/15/2019   CL 106 06/15/2019   CO2 27 06/15/2019   GLUCOSE 94 06/15/2019   BUN 19 06/15/2019   CREATININE 1.11 06/15/2019   CALCIUM 8.8 06/15/2019   PROT 6.4 12/10/2015   ALBUMIN 3.6 12/10/2015   AST 22 12/10/2015   ALT 19 12/10/2015   ALKPHOS 88 12/10/2015   BILITOT 0.5 12/10/2015   GFRNONAA 57 (L) 06/15/2019   GFRAA 66 06/15/2019     Assessment/Plan:   1. Thrombocytopenia  Bone marrow biopsy 07/08/2016-no evidence of  B-cell lymphoma, 40% cellular bone marrow with trilineage hematopoiesis, megakaryocytes present with normal morphology, normal cytogenetics, negative for BRAF mutation  Flow cytometry 01/05/2009-no monoclonal B-cell or phenotypically abnormal T-cell population 2. Hairy cell leukemia 1982, status post splenectomy 3. Coronary artery disease 4. Aortic stenosis 5. 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 Macular degeneration 7. Hearing loss 8. Basal cell carcinoma left cheek 05/24/2019 9. Left upper lobe nodule, faint FDG activity on PET at [facility] 05/31/2013   Disposition:   Mr. Turvey has a remote history of hairy cell leukemia.  He remains in remission from the hairy cell leukemia after undergoing a splenectomy in 1982.  He is referred for evaluation of thrombocytopenia prior to undergoing cardiac catheterization and TAVR procedures.  Thrombocytopenia has been present for the past several years and has been evaluated extensively at [facility].  This included  a bone marrow biopsy in 2018 which showed no evidence of hairy cell leukemia or metastatic carcinoma.  There was no evidence of myelodysplasia.  There is no clear explanation for the thrombocytopenia in his case.  The thrombocytopenia is most likely unrelated to his current medical regimen or alcohol use.  There is no evidence of chronic liver disease or metastatic carcinoma to explain the thrombocytopenia.  He most likely has ITP.  We discussed the diagnosis of ITP and treatment options.  We generally do not treat ITP unless the platelet count is less than 30,000.  However he is being scheduled for cardiac catheterization to potentially be followed by a TAVR procedure.  The cardiologist would like to have the platelet count higher if possible.  Mr. Lento agrees to a trial of prednisone.  He will begin prednisone at a dose of 40 mg daily.  We reviewed potential toxicities associated with prednisone including the chance of insomnia,  altered mental status, cataracts, and peptic ulcer disease.  He will return for an office visit and CBC on 07/04/2019.  We we will consider a diagnostic trial of IVIG if the platelet count does not improve on prednisone.  Betsy Coder, MD  06/29/2019, 2:41 PM

## 2019-06-29 NOTE — Patient Instructions (Signed)
Please provide a copy of your Medical Advanced Directive to have scanned into your medical record. 

## 2019-07-01 ENCOUNTER — Institutional Professional Consult (permissible substitution): Payer: Medicare Other | Admitting: Cardiovascular Disease

## 2019-07-04 ENCOUNTER — Telehealth: Payer: Self-pay | Admitting: Oncology

## 2019-07-04 ENCOUNTER — Inpatient Hospital Stay: Payer: Medicare Other | Attending: Oncology | Admitting: Oncology

## 2019-07-04 ENCOUNTER — Inpatient Hospital Stay: Payer: Medicare Other

## 2019-07-04 ENCOUNTER — Other Ambulatory Visit: Payer: Self-pay

## 2019-07-04 VITALS — BP 142/66 | HR 63 | Temp 98.5°F | Resp 17 | Ht 64.0 in | Wt 169.5 lb

## 2019-07-04 DIAGNOSIS — Z5181 Encounter for therapeutic drug level monitoring: Secondary | ICD-10-CM

## 2019-07-04 DIAGNOSIS — D696 Thrombocytopenia, unspecified: Secondary | ICD-10-CM

## 2019-07-04 DIAGNOSIS — Z7982 Long term (current) use of aspirin: Secondary | ICD-10-CM | POA: Insufficient documentation

## 2019-07-04 DIAGNOSIS — I209 Angina pectoris, unspecified: Secondary | ICD-10-CM

## 2019-07-04 DIAGNOSIS — R0602 Shortness of breath: Secondary | ICD-10-CM | POA: Diagnosis not present

## 2019-07-04 DIAGNOSIS — D693 Immune thrombocytopenic purpura: Secondary | ICD-10-CM | POA: Insufficient documentation

## 2019-07-04 LAB — CBC WITH DIFFERENTIAL (CANCER CENTER ONLY)
Abs Immature Granulocytes: 0.88 10*3/uL — ABNORMAL HIGH (ref 0.00–0.07)
Basophils Absolute: 0.1 10*3/uL (ref 0.0–0.1)
Basophils Relative: 1 %
Eosinophils Absolute: 0 10*3/uL (ref 0.0–0.5)
Eosinophils Relative: 0 %
HCT: 37.5 % — ABNORMAL LOW (ref 39.0–52.0)
Hemoglobin: 12.1 g/dL — ABNORMAL LOW (ref 13.0–17.0)
Immature Granulocytes: 7 %
Lymphocytes Relative: 12 %
Lymphs Abs: 1.5 10*3/uL (ref 0.7–4.0)
MCH: 27.8 pg (ref 26.0–34.0)
MCHC: 32.3 g/dL (ref 30.0–36.0)
MCV: 86.2 fL (ref 80.0–100.0)
Monocytes Absolute: 2.2 10*3/uL — ABNORMAL HIGH (ref 0.1–1.0)
Monocytes Relative: 18 %
Neutro Abs: 7.7 10*3/uL (ref 1.7–7.7)
Neutrophils Relative %: 62 %
Platelet Count: 31 10*3/uL — ABNORMAL LOW (ref 150–400)
RBC: 4.35 MIL/uL (ref 4.22–5.81)
RDW: 15.3 % (ref 11.5–15.5)
WBC Count: 12.5 10*3/uL — ABNORMAL HIGH (ref 4.0–10.5)
nRBC: 0 % (ref 0.0–0.2)

## 2019-07-04 MED ORDER — ROMIPLOSTIM 125 MCG ~~LOC~~ SOLR
1.0000 ug/kg | Freq: Once | SUBCUTANEOUS | Status: AC
  Administered 2019-07-04: 75 ug via SUBCUTANEOUS
  Filled 2019-07-04: qty 0.15

## 2019-07-04 NOTE — Progress Notes (Signed)
  [place] OFFICE PROGRESS NOTE   Diagnosis: History of hairy cell leukemia, thrombocytopenia  INTERVAL HISTORY:   [name] began a trial of prednisone on 06/29/2019.  He reports 1 "sweat "in the evening.  No bleeding.  No other complaint.  Objective:  Vital signs in last 24 hours:  Blood pressure (!) 142/66, pulse 63, temperature 98.5 F (36.9 C), temperature source Temporal, resp. rate 17, height '5\' 4"'$  (1.626 m), weight 169 lb 8 oz (76.9 kg), SpO2 98 %.    HEENT: No thrush or bleeding  Vascular: No leg edema    Lab Results:  Lab Results  Component Value Date   WBC 12.5 (H) 07/04/2019   HGB 12.1 (L) 07/04/2019   HCT 37.5 (L) 07/04/2019   MCV 86.2 07/04/2019   PLT 31 (L) 07/04/2019   NEUTROABS PENDING 07/04/2019    CMP  Lab Results  Component Value Date   NA 138 06/15/2019   K 4.2 06/15/2019   CL 106 06/15/2019   CO2 27 06/15/2019   GLUCOSE 94 06/15/2019   BUN 19 06/15/2019   CREATININE 1.11 06/15/2019   CALCIUM 8.8 06/15/2019   PROT 6.4 12/10/2015   ALBUMIN 3.6 12/10/2015   AST 22 12/10/2015   ALT 19 12/10/2015   ALKPHOS 88 12/10/2015   BILITOT 0.5 12/10/2015   GFRNONAA 57 (L) 06/15/2019   GFRAA 66 06/15/2019     Medications: I have reviewed the patient's current medications.   Assessment/Plan: 1. Thrombocytopenia  Bone marrow biopsy 07/08/2016-no evidence of B-cell lymphoma, 40% cellular bone marrow with trilineage hematopoiesis, megakaryocytes present with normal morphology, normal cytogenetics, negative for BRAF mutation  Flow cytometry 01/05/2009-no monoclonal B-cell or phenotypically abnormal T-cell population  Prednisone starting 06/29/2019  Nplate 07/04/2019 2. Hairy cell leukemia 1982, status post splenectomy 3. Coronary artery disease 4. Aortic stenosis 5. 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 Macular degeneration 7. Hearing loss 8. Basal cell carcinoma left cheek 05/24/2019 9. Left upper lobe nodule,  faint FDG activity on PET at [facility] 05/31/2013     Disposition: Mr. Kuechle appears unchanged.  He has been on prednisone since 07/29/2019.  The thrombocytopenia has not improved.  This may be secondary to a delayed response to prednisone.  He will continue prednisone.  We will add weekly Nplate with the goal of obtaining a more rapid platelet response. I reviewed potential toxicities associated with Nplate.  He agrees to proceed.  Mr. Ronna Polio will return for a lab visit on 07/08/2019.  He will be scheduled for an office visit on 07/11/2019.  Betsy Coder, MD  07/04/2019  8:46 AM

## 2019-07-04 NOTE — Telephone Encounter (Signed)
Scheduled appt per 5/3 los - pt aware of appts - my chart active.

## 2019-07-08 ENCOUNTER — Encounter: Payer: Self-pay | Admitting: Oncology

## 2019-07-08 ENCOUNTER — Inpatient Hospital Stay: Payer: Medicare Other

## 2019-07-08 ENCOUNTER — Telehealth: Payer: Self-pay | Admitting: *Deleted

## 2019-07-08 ENCOUNTER — Other Ambulatory Visit: Payer: Self-pay

## 2019-07-08 DIAGNOSIS — D693 Immune thrombocytopenic purpura: Secondary | ICD-10-CM | POA: Diagnosis not present

## 2019-07-08 DIAGNOSIS — R0602 Shortness of breath: Secondary | ICD-10-CM | POA: Diagnosis not present

## 2019-07-08 DIAGNOSIS — Z7982 Long term (current) use of aspirin: Secondary | ICD-10-CM | POA: Diagnosis not present

## 2019-07-08 LAB — BASIC METABOLIC PANEL - CANCER CENTER ONLY
Anion gap: 10 (ref 5–15)
BUN: 25 mg/dL — ABNORMAL HIGH (ref 8–23)
CO2: 23 mmol/L (ref 22–32)
Calcium: 8.3 mg/dL — ABNORMAL LOW (ref 8.9–10.3)
Chloride: 106 mmol/L (ref 98–111)
Creatinine: 1.04 mg/dL (ref 0.61–1.24)
GFR, Est AFR Am: >60 mL/min (ref >60)
GFR, Estimated: >60 mL/min (ref >60)
Glucose, Bld: 92 mg/dL (ref 70–99)
Potassium: 4 mmol/L (ref 3.5–5.1)
Sodium: 139 mmol/L (ref 135–145)

## 2019-07-08 LAB — CBC WITH DIFFERENTIAL (CANCER CENTER ONLY)
Abs Immature Granulocytes: 0.99 10*3/uL — ABNORMAL HIGH (ref 0.00–0.07)
Basophils Absolute: 0.1 10*3/uL (ref 0.0–0.1)
Basophils Relative: 1 %
Eosinophils Absolute: 0 10*3/uL (ref 0.0–0.5)
Eosinophils Relative: 0 %
HCT: 36.9 % — ABNORMAL LOW (ref 39.0–52.0)
Hemoglobin: 11.9 g/dL — ABNORMAL LOW (ref 13.0–17.0)
Immature Granulocytes: 8 %
Lymphocytes Relative: 10 %
Lymphs Abs: 1.2 10*3/uL (ref 0.7–4.0)
MCH: 27.2 pg (ref 26.0–34.0)
MCHC: 32.2 g/dL (ref 30.0–36.0)
MCV: 84.2 fL (ref 80.0–100.0)
Monocytes Absolute: 2 10*3/uL — ABNORMAL HIGH (ref 0.1–1.0)
Monocytes Relative: 17 %
Neutro Abs: 7.7 10*3/uL (ref 1.7–7.7)
Neutrophils Relative %: 64 %
Platelet Count: 17 10*3/uL — ABNORMAL LOW (ref 150–400)
RBC: 4.38 MIL/uL (ref 4.22–5.81)
RDW: 15.2 % (ref 11.5–15.5)
WBC Count: 12 10*3/uL — ABNORMAL HIGH (ref 4.0–10.5)
nRBC: 0.4 % — ABNORMAL HIGH (ref 0.0–0.2)

## 2019-07-08 LAB — IMMATURE PLATELET FRACTION: Immature Platelet Fraction: 33.5 % — ABNORMAL HIGH (ref 1.2–8.6)

## 2019-07-08 NOTE — Telephone Encounter (Signed)
Notified of lab results and platelet count. Based on the immature platelet fraction he most likely has ITP per Dr. Benay Spice. Informed him to continue the prednisone and call for any bleeding. F/U on Monday as scheduled. MD will discuss adding IVIG infusion later in the week.

## 2019-07-11 ENCOUNTER — Inpatient Hospital Stay (HOSPITAL_BASED_OUTPATIENT_CLINIC_OR_DEPARTMENT_OTHER): Payer: Medicare Other | Admitting: Oncology

## 2019-07-11 ENCOUNTER — Telehealth: Payer: Self-pay | Admitting: Oncology

## 2019-07-11 ENCOUNTER — Inpatient Hospital Stay: Payer: Medicare Other

## 2019-07-11 ENCOUNTER — Other Ambulatory Visit: Payer: Self-pay

## 2019-07-11 VITALS — BP 139/70 | HR 63 | Temp 98.2°F | Resp 18 | Ht 64.0 in | Wt 170.2 lb

## 2019-07-11 DIAGNOSIS — I209 Angina pectoris, unspecified: Secondary | ICD-10-CM

## 2019-07-11 DIAGNOSIS — Z7982 Long term (current) use of aspirin: Secondary | ICD-10-CM | POA: Diagnosis not present

## 2019-07-11 DIAGNOSIS — D693 Immune thrombocytopenic purpura: Secondary | ICD-10-CM | POA: Diagnosis not present

## 2019-07-11 DIAGNOSIS — R0602 Shortness of breath: Secondary | ICD-10-CM | POA: Diagnosis not present

## 2019-07-11 LAB — CBC WITH DIFFERENTIAL (CANCER CENTER ONLY)
Abs Immature Granulocytes: 1.53 10*3/uL — ABNORMAL HIGH (ref 0.00–0.07)
Basophils Absolute: 0.1 10*3/uL (ref 0.0–0.1)
Basophils Relative: 1 %
Eosinophils Absolute: 0.1 10*3/uL (ref 0.0–0.5)
Eosinophils Relative: 0 %
HCT: 37 % — ABNORMAL LOW (ref 39.0–52.0)
Hemoglobin: 12.2 g/dL — ABNORMAL LOW (ref 13.0–17.0)
Immature Granulocytes: 11 %
Lymphocytes Relative: 6 %
Lymphs Abs: 0.9 10*3/uL (ref 0.7–4.0)
MCH: 27.6 pg (ref 26.0–34.0)
MCHC: 33 g/dL (ref 30.0–36.0)
MCV: 83.7 fL (ref 80.0–100.0)
Monocytes Absolute: 3.1 10*3/uL — ABNORMAL HIGH (ref 0.1–1.0)
Monocytes Relative: 21 %
Neutro Abs: 8.9 10*3/uL — ABNORMAL HIGH (ref 1.7–7.7)
Neutrophils Relative %: 61 %
Platelet Count: 25 10*3/uL — ABNORMAL LOW (ref 150–400)
RBC: 4.42 MIL/uL (ref 4.22–5.81)
RDW: 15.4 % (ref 11.5–15.5)
WBC Count: 14.5 10*3/uL — ABNORMAL HIGH (ref 4.0–10.5)
nRBC: 0.1 % (ref 0.0–0.2)

## 2019-07-11 MED ORDER — ROMIPLOSTIM 250 MCG ~~LOC~~ SOLR
2.0000 ug/kg | Freq: Once | SUBCUTANEOUS | Status: AC
  Administered 2019-07-11: 155 ug via SUBCUTANEOUS
  Filled 2019-07-11: qty 0.31

## 2019-07-11 MED ORDER — PREDNISONE 10 MG PO TABS: 40.0000 mg | ORAL_TABLET | Freq: Every day | ORAL | 0 refills | Status: DC

## 2019-07-11 NOTE — Progress Notes (Signed)
  [place] OFFICE PROGRESS NOTE   Diagnosis: Thrombocytopenia  INTERVAL HISTORY:   [name] returns as scheduled.  He continues prednisone.  He was treated with Nplate on 08/05/8004.  He was treated with Nplate on 05/04/9492.  No bleeding.  He had exertional dyspnea while picking up sticks in the yard this weekend.  No other complaint.  Objective:  Vital signs in last 24 hours:  Blood pressure 139/70, pulse 63, temperature 98.2 F (36.8 C), temperature source Temporal, resp. rate 18, height '5\' 4"'$  (1.626 m), weight 170 lb 3.2 oz (77.2 kg), SpO2 96 %.    HEENT: No thrush or bleeding Resp: Lungs clear bilaterally Cardio: Regular rate and rhythm, 2/6 systolic murmur GI: No hepatomegaly Vascular: No leg edema  Skin: No petechiae or ecchymoses    Lab Results:  Lab Results  Component Value Date   WBC 12.0 (H) 07/08/2019   HGB 11.9 (L) 07/08/2019   HCT 36.9 (L) 07/08/2019   MCV 84.2 07/08/2019   PLT 17 (L) 07/08/2019   NEUTROABS 7.7 07/08/2019   07/08/2019: Immature platelet fraction 33.5 CMP  Lab Results  Component Value Date   NA 139 07/08/2019   K 4.0 07/08/2019   CL 106 07/08/2019   CO2 23 07/08/2019   GLUCOSE 92 07/08/2019   BUN 25 (H) 07/08/2019   CREATININE 1.04 07/08/2019   CALCIUM 8.3 (L) 07/08/2019   PROT 6.4 12/10/2015   ALBUMIN 3.6 12/10/2015   AST 22 12/10/2015   ALT 19 12/10/2015   ALKPHOS 88 12/10/2015   BILITOT 0.5 12/10/2015   GFRNONAA >60 07/08/2019   GFRAA >60 07/08/2019     Medications: I have reviewed the patient's current medications.   Assessment/Plan: 1. Thrombocytopenia  Bone marrow biopsy 07/08/2016-no evidence of B-cell lymphoma, 40% cellular bone marrow with trilineage hematopoiesis, megakaryocytes present with normal morphology, normal cytogenetics, negative for BRAF mutation  Flow cytometry 01/05/2009-no monoclonal B-cell or phenotypically abnormal T-cell population  Prednisone starting 06/29/2019  Nplate 07/04/2019,  4/73/9584 2. Hairy cell leukemia 1982, status post splenectomy 3. Coronary artery disease 4. Aortic stenosis 5. 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 Macular degeneration 7. Hearing loss 8. Basal cell carcinoma left cheek 05/24/2019 9. Left upper lobe nodule, faint FDG activity on PET at [facility] 05/31/2013       Disposition: [name] appears stable.  He is tolerating prednisone well.  The platelet count is slightly higher compared to when he was here on Friday.  He continues to have severe thrombocytopenia.  He most likely has ITP that is slow to respond to prednisone.  He will continue prednisone at a dose of 40 mg daily.  He will continue weekly Nplate.  The plan is to add IVIG if the platelet count has not responded next week.  We reviewed potential toxicities associated with IVIG therapy.  [name] will be seen for an office visit next week.  Betsy Coder, MD  07/11/2019  8:11 AM

## 2019-07-11 NOTE — Addendum Note (Signed)
Addended by: Tania Ade on: 07/11/2019 09:09 AM   Modules accepted: Orders

## 2019-07-11 NOTE — Telephone Encounter (Signed)
Scheduled appt per 5/10 los - gave patient AVS and calender

## 2019-07-14 DIAGNOSIS — H353221 Exudative age-related macular degeneration, left eye, with active choroidal neovascularization: Secondary | ICD-10-CM | POA: Diagnosis not present

## 2019-07-18 ENCOUNTER — Telehealth: Payer: Self-pay

## 2019-07-18 ENCOUNTER — Inpatient Hospital Stay: Payer: Medicare Other

## 2019-07-18 ENCOUNTER — Inpatient Hospital Stay (HOSPITAL_BASED_OUTPATIENT_CLINIC_OR_DEPARTMENT_OTHER): Payer: Medicare Other | Admitting: Nurse Practitioner

## 2019-07-18 ENCOUNTER — Encounter: Payer: Self-pay | Admitting: Nurse Practitioner

## 2019-07-18 ENCOUNTER — Other Ambulatory Visit: Payer: Self-pay

## 2019-07-18 ENCOUNTER — Telehealth: Payer: Self-pay | Admitting: Oncology

## 2019-07-18 VITALS — BP 143/62 | HR 57 | Temp 98.2°F | Resp 18 | Ht 64.0 in | Wt 170.2 lb

## 2019-07-18 DIAGNOSIS — D693 Immune thrombocytopenic purpura: Secondary | ICD-10-CM | POA: Diagnosis not present

## 2019-07-18 DIAGNOSIS — D696 Thrombocytopenia, unspecified: Secondary | ICD-10-CM

## 2019-07-18 DIAGNOSIS — R0602 Shortness of breath: Secondary | ICD-10-CM | POA: Diagnosis not present

## 2019-07-18 DIAGNOSIS — Z7982 Long term (current) use of aspirin: Secondary | ICD-10-CM | POA: Diagnosis not present

## 2019-07-18 DIAGNOSIS — I209 Angina pectoris, unspecified: Secondary | ICD-10-CM

## 2019-07-18 LAB — CBC WITH DIFFERENTIAL (CANCER CENTER ONLY)
Abs Immature Granulocytes: 1.18 10*3/uL — ABNORMAL HIGH (ref 0.00–0.07)
Basophils Absolute: 0.1 10*3/uL (ref 0.0–0.1)
Basophils Relative: 1 %
Eosinophils Absolute: 0.1 10*3/uL (ref 0.0–0.5)
Eosinophils Relative: 0 %
HCT: 37.9 % — ABNORMAL LOW (ref 39.0–52.0)
Hemoglobin: 12.3 g/dL — ABNORMAL LOW (ref 13.0–17.0)
Immature Granulocytes: 8 %
Lymphocytes Relative: 8 %
Lymphs Abs: 1.2 10*3/uL (ref 0.7–4.0)
MCH: 27.5 pg (ref 26.0–34.0)
MCHC: 32.5 g/dL (ref 30.0–36.0)
MCV: 84.6 fL (ref 80.0–100.0)
Monocytes Absolute: 2.4 10*3/uL — ABNORMAL HIGH (ref 0.1–1.0)
Monocytes Relative: 16 %
Neutro Abs: 10 10*3/uL — ABNORMAL HIGH (ref 1.7–7.7)
Neutrophils Relative %: 67 %
Platelet Count: 65 10*3/uL — ABNORMAL LOW (ref 150–400)
RBC: 4.48 MIL/uL (ref 4.22–5.81)
RDW: 15.8 % — ABNORMAL HIGH (ref 11.5–15.5)
WBC Count: 14.9 10*3/uL — ABNORMAL HIGH (ref 4.0–10.5)
nRBC: 0 % (ref 0.0–0.2)

## 2019-07-18 MED ORDER — ROMIPLOSTIM 250 MCG ~~LOC~~ SOLR
2.0000 ug/kg | Freq: Once | SUBCUTANEOUS | Status: AC
  Administered 2019-07-18: 155 ug via SUBCUTANEOUS
  Filled 2019-07-18: qty 0.31

## 2019-07-18 NOTE — Telephone Encounter (Signed)
-----   Message from Belva Crome, MD sent at 07/18/2019  2:29 PM EDT ----- Regarding: Needs to be set up for right and left heart cath with me next week (? Monday) We have been trying to get his platelet count above 50,000 to allow right and left heart cath and coronary angiography before referring to the aortic valve clinic.  Please set him up to have heart cath with me as early as possible next week?  Monday.  Let me know if any questions.

## 2019-07-18 NOTE — Telephone Encounter (Signed)
The patient agrees to [facility] early next week (5/25).  COVID test scheduled 5/22. Instructions reviewed and released to MyChart. He requests a call on Wednesday to review together. He was grateful for assistance.

## 2019-07-18 NOTE — Patient Instructions (Signed)
Romiplostim injection What is this medicine? ROMIPLOSTIM (roe mi PLOE stim) helps your body make more platelets. This medicine is used to treat low platelets caused by chronic idiopathic thrombocytopenic purpura (ITP). This medicine may be used for other purposes; ask your health care provider or pharmacist if you have questions. COMMON BRAND NAME(S): Nplate What should I tell my health care provider before I take this medicine? They need to know if you have any of these conditions:  bleeding disorders  bone marrow problem, like blood cancer or myelodysplastic syndrome  history of blood clots  liver disease  surgery to remove your spleen  an unusual or allergic reaction to romiplostim, mannitol, other medicines, foods, dyes, or preservatives  pregnant or trying to get pregnant  breast-feeding How should I use this medicine? This medicine is for injection under the skin. It is given by a health care professional in a hospital or clinic setting. A special MedGuide will be given to you before your injection. Read this information carefully each time. Talk to your pediatrician regarding the use of this medicine in children. While this drug may be prescribed for children as young as 1 year for selected conditions, precautions do apply. Overdosage: If you think you have taken too much of this medicine contact a poison control center or emergency room at once. NOTE: This medicine is only for you. Do not share this medicine with others. What if I miss a dose? It is important not to miss your dose. Call your doctor or health care professional if you are unable to keep an appointment. What may interact with this medicine? Interactions are not expected. This list may not describe all possible interactions. Give your health care provider a list of all the medicines, herbs, non-prescription drugs, or dietary supplements you use. Also tell them if you smoke, drink alcohol, or use illegal drugs.  Some items may interact with your medicine. What should I watch for while using this medicine? Your condition will be monitored carefully while you are receiving this medicine. Visit your prescriber or health care professional for regular checks on your progress and for the needed blood tests. It is important to keep all appointments. What side effects may I notice from receiving this medicine? Side effects that you should report to your doctor or health care professional as soon as possible:  allergic reactions like skin rash, itching or hives, swelling of the face, lips, or tongue  signs and symptoms of bleeding such as bloody or black, tarry stools; red or dark brown urine; spitting up blood or brown material that looks like coffee grounds; red spots on the skin; unusual bruising or bleeding from the eyes, gums, or nose  signs and symptoms of a blood clot such as chest pain; shortness of breath; pain, swelling, or warmth in the leg  signs and symptoms of a stroke like changes in vision; confusion; trouble speaking or understanding; severe headaches; sudden numbness or weakness of the face, arm or leg; trouble walking; dizziness; loss of balance or coordination Side effects that usually do not require medical attention (report to your doctor or health care professional if they continue or are bothersome):  headache  pain in arms and legs  pain in mouth  stomach pain This list may not describe all possible side effects. Call your doctor for medical advice about side effects. You may report side effects to FDA at 1-800-FDA-1088. Where should I keep my medicine? This drug is given in a hospital or clinic   and will not be stored at home. NOTE: This sheet is a summary. It may not cover all possible information. If you have questions about this medicine, talk to your doctor, pharmacist, or health care provider.  2020 Elsevier/Gold Standard (2017-02-16 11:10:55)  

## 2019-07-18 NOTE — Telephone Encounter (Signed)
Called to arrange [facility] next Monday with Dr. Tamala Julian. Dx: AS  Left message to call back.

## 2019-07-18 NOTE — Progress Notes (Addendum)
  [place] OFFICE PROGRESS NOTE   Diagnosis: Thrombocytopenia  INTERVAL HISTORY:   [name] returns as scheduled.  He continues prednisone 40 mg daily.  He received a second Nplate injection on 7/51/7001.  He reports a good appetite and good energy level.  He has stable dyspnea on exertion.  No bleeding.  He denies leg swelling.  No mouth discomfort.  He denies headaches.  No rash.  No fever.  Objective:  Vital signs in last 24 hours:  Blood pressure (!) 143/62, pulse (!) 57, temperature 98.2 F (36.8 C), temperature source Temporal, resp. rate 18, height _0  (1.626 m), weight 170 lb 3.2 oz (77.2 kg), SpO2 98 %.    HEENT: Mild white coating of her tongue.  No buccal thrush. Resp: Lungs clear bilaterally. Cardio: Regular rate and rhythm; 2/6 systolic murmur. GI: Abdomen soft and nontender.  No hepatosplenomegaly. Vascular: Trace pretibial edema bilaterally.  Lab Results:  Lab Results  Component Value Date   WBC 14.9 (H) 07/18/2019   HGB 12.3 (L) 07/18/2019   HCT 37.9 (L) 07/18/2019   MCV 84.6 07/18/2019   PLT 65 (L) 07/18/2019   NEUTROABS PENDING 07/18/2019    Imaging:  No results found.  Medications: I have reviewed the patient's current medications.  Assessment/Plan: 1. Thrombocytopenia ? Bone marrow biopsy 07/08/2016-no evidence of B-cell lymphoma, 40% cellular bone marrow with trilineage hematopoiesis, megakaryocytes present with normal morphology, normal cytogenetics, negative for BRAF mutation ? Flow cytometry 01/05/2009-no monoclonal B-cell or phenotypically abnormal T-cell population ? Prednisone starting 06/29/2019 ? Nplate 07/04/2019, 7/49/4496, Nplate 07/18/2019 2. Hairy cell leukemia 1982, status post splenectomy 3. Coronary artery disease 4. Aortic stenosis 5. 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 Macular degeneration 7. Hearing loss 8. Basal cell carcinoma left cheek 05/24/2019 9. Left upper lobe nodule, faint FDG  activity on PET at [facility] 05/31/2013  Disposition: [name] appears stable.  We reviewed the CBC from today.  The platelet count is higher.  He will receive Nplate today as planned.  He will decrease prednisone from 40 mg daily to 30 mg daily.  He will return for a follow-up CBC on 07/22/2019.  He will return for lab, follow-up, Nplate on 7/59/1638.  He will contact the office in the interim with any problems.  Patient seen with Dr. Benay Spice.    [name] ANP/GNP-BC   07/18/2019  10:16 AM This was a shared visit with [name].  [name] appears well.  The platelets are higher today.  We tapered the prednisone to 30 mg daily.  He will receive another dose of Nplate today.  I will contact Dr. Tamala Julian to discuss scheduling of the cardiac catheterization procedure.  Julieanne Manson, MD

## 2019-07-18 NOTE — Telephone Encounter (Signed)
Scheduled appt per 5/17 los - gave patient AVS and calender  

## 2019-07-20 NOTE — Telephone Encounter (Signed)
Reviewed catheterization instructions with patient and his wife. His wife wants to confirm with Dr. Tamala Julian he is (or is not) to take ASA 81 mg the AM of cath due to platelets.  They understood they would be send a MyChart message with Dr. Thompson Caul recommendations after confirming.

## 2019-07-22 ENCOUNTER — Inpatient Hospital Stay: Payer: Medicare Other

## 2019-07-22 ENCOUNTER — Other Ambulatory Visit: Payer: Self-pay

## 2019-07-22 DIAGNOSIS — Z7982 Long term (current) use of aspirin: Secondary | ICD-10-CM | POA: Diagnosis not present

## 2019-07-22 DIAGNOSIS — R0602 Shortness of breath: Secondary | ICD-10-CM | POA: Diagnosis not present

## 2019-07-22 DIAGNOSIS — D693 Immune thrombocytopenic purpura: Secondary | ICD-10-CM | POA: Diagnosis not present

## 2019-07-22 DIAGNOSIS — D696 Thrombocytopenia, unspecified: Secondary | ICD-10-CM

## 2019-07-22 LAB — CBC WITH DIFFERENTIAL (CANCER CENTER ONLY)
Abs Immature Granulocytes: 1 10*3/uL — ABNORMAL HIGH (ref 0.00–0.07)
Basophils Absolute: 0.2 10*3/uL — ABNORMAL HIGH (ref 0.0–0.1)
Basophils Relative: 1 %
Eosinophils Absolute: 0.1 10*3/uL (ref 0.0–0.5)
Eosinophils Relative: 1 %
HCT: 36.7 % — ABNORMAL LOW (ref 39.0–52.0)
Hemoglobin: 11.8 g/dL — ABNORMAL LOW (ref 13.0–17.0)
Immature Granulocytes: 7 %
Lymphocytes Relative: 10 %
Lymphs Abs: 1.4 10*3/uL (ref 0.7–4.0)
MCH: 26.6 pg (ref 26.0–34.0)
MCHC: 32.2 g/dL (ref 30.0–36.0)
MCV: 82.8 fL (ref 80.0–100.0)
Monocytes Absolute: 2.9 10*3/uL — ABNORMAL HIGH (ref 0.1–1.0)
Monocytes Relative: 20 %
Neutro Abs: 8.7 10*3/uL — ABNORMAL HIGH (ref 1.7–7.7)
Neutrophils Relative %: 61 %
Platelet Count: 73 10*3/uL — ABNORMAL LOW (ref 150–400)
RBC: 4.43 MIL/uL (ref 4.22–5.81)
RDW: 15.8 % — ABNORMAL HIGH (ref 11.5–15.5)
WBC Count: 14.2 10*3/uL — ABNORMAL HIGH (ref 4.0–10.5)
nRBC: 0 % (ref 0.0–0.2)

## 2019-07-23 ENCOUNTER — Inpatient Hospital Stay (HOSPITAL_COMMUNITY): Admission: RE | Admit: 2019-07-23 | Payer: Medicare Other | Source: Ambulatory Visit

## 2019-07-23 NOTE — Progress Notes (Signed)
[place]   [phone] [phone]) [phone]   Clinic Follow up Note   Patient Care Team: [name], MD as PCP - General (Internal Medicine) Belva Crome, MD as PCP - Cardiology (Cardiology) 07/25/2019  CHIEF COMPLAINT: Thrombocytopenia   CURRENT THERAPY: Nplate, prednisone   INTERVAL HISTORY: [name] returns for f/u as scheduled he was seen 07/18/19 and received another Nplate injection. He tapered prednisone to 30 mg daily. He is scheduled to have a cardiac cath tomorrow under the care of Dr. Daneen Schick. He will take an 81 mg aspirin prior to the procedure and will take all his medications including prednisone with small sips of water prior to his cath. He is overall doing well except for his continued decreased endurance and shortness of breath which is believed to be cardiac in nature. He has no other issues of concern today.  REVIEW OF SYSTEMS:   Constitutional: Decreased endurance. Eyes: Denies blurriness of vision Ears, nose, mouth, throat, and face: Denies mucositis or sore throat Respiratory: Shortness of breath. Denies cough, dyspnea or wheezes Cardiovascular: Denies palpitation, chest discomfort or lower extremity swelling Gastrointestinal:  Denies nausea, heartburn or change in bowel habits Skin: Denies abnormal skin rashes Lymphatics: Denies new lymphadenopathy or easy bruising Neurological:Denies numbness, tingling or new weaknesses Behavioral/Psych: Mood is stable, no new changes  All other systems were reviewed with the patient and are negative.  MEDICAL HISTORY:  Past Medical History:  Diagnosis Date  . Acute appendicitis   . Anemia   . Anxiety   . Arthritis    "back" (03/14/2014)  . CAD (coronary artery disease)    a. 03/14/14  s/p overlapping DES x2 to mid-distal RCA.  . 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 Colon cancer ([place]) 1984  . Compression fracture of lumbar spine, non-traumatic ([place])   . DJD  (degenerative joint disease) of lumbar spine   . Dyslipidemia   . Elevated PSA   . GERD (gastroesophageal reflux disease)   . Glaucoma   . Hairy cell leukemia ([place]) dx'd 1980  . History of blood transfusion    "several; related to hairy cell leukemia & tx "  . History of stomach ulcers 1968  . Hypertension   . Malnutrition ([place])   . Osteoarthritis   . Osteoporosis   . Skin cancer of face     SURGICAL HISTORY: Past Surgical History:  Procedure Laterality Date  . APPENDECTOMY  10/2007  . CATARACT EXTRACTION, BILATERAL Bilateral 02/2008  . COLON SURGERY  1984   "sigmoid; open"  . CORONARY ANGIOPLASTY WITH STENT PLACEMENT  03/14/2014   "2"  . FRACTURE SURGERY    . LEFT HEART CATHETERIZATION WITH CORONARY ANGIOGRAM N/A 03/14/2014   Procedure: LEFT HEART CATHETERIZATION WITH CORONARY ANGIOGRAM;  Surgeon: Peter M Martinique, MD;  Location: [facility] CATH LAB;  Service: Cardiovascular;  Laterality: N/A;  . LIPOMA EXCISION Right 07/2011   liposarcoma resection; "back"  . MOHS SURGERY Left 02/2011  . MOHS SURGERY  X 3   "all on my face"  . MOLE REMOVAL Left 1985   cheek  . ORIF ANKLE FRACTURE Left 2000  . SHOULDER SURGERY  04/2004  . SPLENECTOMY  1992  . TONSILLECTOMY AND ADENOIDECTOMY  1939    I have reviewed the social history and family history with the patient and they are unchanged from previous note.  ALLERGIES:  is allergic to antazoline; antihistamines, chlorpheniramine-type; and sulfa antibiotics.  MEDICATIONS:  Current Outpatient Medications  Medication Sig  Dispense Refill  . atorvastatin (LIPITOR) 20 MG tablet TAKE 1 TABLET BY MOUTH DAILY AT 6 PM (Patient taking differently: Take 20 mg by mouth daily at 6 PM. ) 90 tablet 3  . Cholecalciferol (VITAMIN D-3 PO) Take 2,000 Units by mouth at bedtime.     Marland Kitchen desonide (DESOWEN) 0.05 % cream Apply 1 application topically 2 (two) times daily as needed (irritation).     . fluocinonide (LIDEX) 0.05 % external solution Apply 1 application  topically 2 (two) times daily as needed (apply to ears).   11  . latanoprost (XALATAN) 0.005 % ophthalmic solution Place 1 drop into both eyes at bedtime.     . metoprolol tartrate (LOPRESSOR) 25 MG tablet TAKE ONE-HALF TABLET BY MOUTH DAILY PLEASE KEEP YOUR UPCOMING APPOINTMENT WITH DOCTOR SMITH IN [place] FOR FUTURE REFILLS. THANK YOU (Patient taking differently: Take 12.5 mg by mouth daily. ) 45 tablet 5  . Multiple Vitamins-Minerals (PRESERVISION AREDS 2 PO) Take 1 capsule by mouth 2 (two) times daily.     . nitroGLYCERIN (NITROSTAT) 0.4 MG SL tablet Place 1 tablet (0.4 mg total) under the tongue every 5 (five) minutes as needed for chest pain. Please make yearly appt with Dr. Tamala Julian for September. 1st attempt 25 tablet 2  . pantoprazole (PROTONIX) 40 MG tablet Take 40 mg by mouth daily.    . predniSONE (DELTASONE) 10 MG tablet Take 4 tablets (40 mg total) by mouth daily with breakfast. (Patient taking differently: Take 30 mg by mouth daily with breakfast. ) 112 tablet 0  . ranibizumab (LUCENTIS) 0.5 MG/0.05ML SOLN 0.5 mg by Intravitreal route every 28 (twenty-eight) days.     Marland Kitchen sertraline (ZOLOFT) 50 MG tablet Take 50 mg by mouth every evening.     . tamsulosin (FLOMAX) 0.4 MG CAPS capsule Take 0.8 mg by mouth daily after supper.     . timolol (BETIMOL) 0.5 % ophthalmic solution Place 1 drop into the right eye daily.     Marland Kitchen triamcinolone cream (KENALOG) 0.1 % Apply 1 application topically 2 (two) times daily as needed (irritation).     No current facility-administered medications for this visit.    PHYSICAL EXAMINATION: ECOG PERFORMANCE STATUS: 1 - Symptomatic but completely ambulatory  Vitals:   07/25/19 0819  BP: (!) 147/71  Pulse: 60  Resp: 18  Temp: 98.2 F (36.8 C)  SpO2: 96%   Filed Weights   07/25/19 0819  Weight: 76.7 kg (169 lb)    GENERAL:alert, no distress and comfortable SKIN: skin color, texture, turgor are normal, no rashes or significant lesions.  Multiple  seborrheic keratosis over the back. EYES: normal, Conjunctiva are pink and non-injected, sclera clear LYMPH:  no palpable lymphadenopathy in the cervical, or axillary LUNGS: clear to auscultation and percussion with normal breathing effort HEART: regular rate & rhythm with a subtle systolic ejection murmur and no lower extremity edema ABDOMEN:abdomen soft, non-tender and normal bowel sounds Musculoskeletal:no cyanosis of digits and no clubbing  NEURO: alert & oriented x 3 with fluent speech, no focal motor/sensory deficits  LABORATORY DATA:  I have reviewed the data as listed CBC Latest Ref Rng & Units 07/25/2019 07/22/2019 07/18/2019  WBC 4.0 - 10.5 K/uL 12.0(H) 14.2(H) 14.9(H)  Hemoglobin 13.0 - 17.0 g/dL 12.0(L) 11.8(L) 12.3(L)  Hematocrit 39.0 - 52.0 % 36.4(L) 36.7(L) 37.9(L)  Platelets 150 - 400 K/uL 176 73(L) 65(L)     CMP Latest Ref Rng & Units 07/08/2019 06/15/2019 12/10/2015  Glucose 70 - 99 mg/dL 92 94 85  BUN 8 - 23 mg/dL 25(H) 19 23  Creatinine 0.61 - 1.24 mg/dL 1.04 1.11 1.06  Sodium 135 - 145 mmol/L 139 138 141  Potassium 3.5 - 5.1 mmol/L 4.0 4.2 3.9  Chloride 98 - 111 mmol/L 106 106 106  CO2 22 - 32 mmol/L 23 27 25   Calcium 8.9 - 10.3 mg/dL 8.3(L) 8.8 8.9  Total Protein 6.1 - 8.1 g/dL - - 6.4  Total Bilirubin 0.2 - 1.2 mg/dL - - 0.5  Alkaline Phos 40 - 115 U/L - - 88  AST 10 - 35 U/L - - 22  ALT 9 - 46 U/L - - 19      RADIOGRAPHIC STUDIES: I have personally reviewed the radiological images as listed and agreed with the findings in the report. No results found.   ASSESSMENT & PLAN:  No problem-specific Assessment & Plan notes found for this encounter.   No orders of the defined types were placed in this encounter.  All questions were answered. The patient knows to call the clinic with any problems, questions or concerns. No barriers to learning was detected.    Harle Stanford, PA-C 07/25/19   This patient was seen with Dr. Benay Spice with my treatment plan  reviewed with him. He expressed agreement with my medical management of this patient.  This was a shared visit with me in [place].  [name] appears well.  The platelet count has responded to Nplate and prednisone.  We tapered the prednisone to 20 mg daily with the plan to continue a slow prednisone taper.  We will resume Nplate therapy as needed.Marland Kitchen  He is scheduled for a cardiac catheterization tomorrow.  He will return for an office visit and CBC next week.  Julieanne Manson, MD

## 2019-07-23 NOTE — Telephone Encounter (Signed)
Thanks. I sent message to take aspirin on day of cath.

## 2019-07-25 ENCOUNTER — Inpatient Hospital Stay: Payer: Medicare Other

## 2019-07-25 ENCOUNTER — Inpatient Hospital Stay (HOSPITAL_BASED_OUTPATIENT_CLINIC_OR_DEPARTMENT_OTHER): Payer: Medicare Other | Admitting: Medical

## 2019-07-25 ENCOUNTER — Telehealth: Payer: Self-pay | Admitting: *Deleted

## 2019-07-25 ENCOUNTER — Telehealth: Payer: Self-pay | Admitting: Medical

## 2019-07-25 ENCOUNTER — Other Ambulatory Visit (HOSPITAL_COMMUNITY)
Admission: RE | Admit: 2019-07-25 | Discharge: 2019-07-25 | Disposition: A | Discharge status: Home / Self Care | Payer: Medicare Other | Source: Ambulatory Visit | Attending: Interventional Cardiology | Admitting: Interventional Cardiology

## 2019-07-25 ENCOUNTER — Other Ambulatory Visit: Payer: Self-pay

## 2019-07-25 VITALS — BP 147/71 | HR 60 | Temp 98.2°F | Resp 18 | Ht 64.0 in | Wt 169.0 lb

## 2019-07-25 DIAGNOSIS — Z20822 Contact with and (suspected) exposure to covid-19: Secondary | ICD-10-CM | POA: Insufficient documentation

## 2019-07-25 DIAGNOSIS — R5383 Other fatigue: Secondary | ICD-10-CM

## 2019-07-25 DIAGNOSIS — I209 Angina pectoris, unspecified: Secondary | ICD-10-CM

## 2019-07-25 DIAGNOSIS — R0602 Shortness of breath: Secondary | ICD-10-CM | POA: Diagnosis not present

## 2019-07-25 DIAGNOSIS — R5381 Other malaise: Secondary | ICD-10-CM | POA: Diagnosis not present

## 2019-07-25 DIAGNOSIS — Z7982 Long term (current) use of aspirin: Secondary | ICD-10-CM | POA: Diagnosis not present

## 2019-07-25 DIAGNOSIS — D693 Immune thrombocytopenic purpura: Secondary | ICD-10-CM

## 2019-07-25 DIAGNOSIS — Z01812 Encounter for preprocedural laboratory examination: Secondary | ICD-10-CM | POA: Diagnosis not present

## 2019-07-25 DIAGNOSIS — D696 Thrombocytopenia, unspecified: Secondary | ICD-10-CM

## 2019-07-25 LAB — CBC WITH DIFFERENTIAL (CANCER CENTER ONLY)
Abs Immature Granulocytes: 0.9 10*3/uL — ABNORMAL HIGH (ref 0.00–0.07)
Basophils Absolute: 0.1 10*3/uL (ref 0.0–0.1)
Basophils Relative: 1 %
Eosinophils Absolute: 0.1 10*3/uL (ref 0.0–0.5)
Eosinophils Relative: 1 %
HCT: 36.4 % — ABNORMAL LOW (ref 39.0–52.0)
Hemoglobin: 12 g/dL — ABNORMAL LOW (ref 13.0–17.0)
Immature Granulocytes: 8 %
Lymphocytes Relative: 5 %
Lymphs Abs: 0.6 10*3/uL — ABNORMAL LOW (ref 0.7–4.0)
MCH: 27 pg (ref 26.0–34.0)
MCHC: 33 g/dL (ref 30.0–36.0)
MCV: 81.8 fL (ref 80.0–100.0)
Monocytes Absolute: 2.5 10*3/uL — ABNORMAL HIGH (ref 0.1–1.0)
Monocytes Relative: 21 %
Neutro Abs: 7.8 10*3/uL — ABNORMAL HIGH (ref 1.7–7.7)
Neutrophils Relative %: 64 %
Platelet Count: 176 10*3/uL (ref 150–400)
RBC: 4.45 MIL/uL (ref 4.22–5.81)
RDW: 15.6 % — ABNORMAL HIGH (ref 11.5–15.5)
WBC Count: 12 10*3/uL — ABNORMAL HIGH (ref 4.0–10.5)
nRBC: 0 % (ref 0.0–0.2)

## 2019-07-25 LAB — SARS CORONAVIRUS 2 (TAT 6-24 HRS): SARS Coronavirus 2: NEGATIVE

## 2019-07-25 NOTE — Telephone Encounter (Signed)
Pt contacted pre-catheterization scheduled at [facility] for: Jul 26, 2019 9 AM Verified arrival time and place: [facility]) at: 7 AM   No solid food after midnight prior to cath, clear liquids until 5 AM day of procedure.   AM meds can be  taken pre-cath with sip of water including: ASA 81 mg   Confirmed patient has responsible adult to drive home post procedure and observe 24 hours after arriving home: yes  You are allowed ONE visitor in the waiting room during your procedure. Both you and your visitor must wear masks.      COVID-19 Pre-Screening Questions:  . In the past 7 to 10 days have you had a cough,  shortness of breath, headache, congestion, fever (100 or greater) body aches, chills, sore throat, or sudden loss of taste or sense of smell? no . Have you been around anyone with known Covid 19 in the past 7 to 10 days? no . Have you been around anyone who is awaiting Covid 19 test results in the past 7 to 10 days? no . Have you been around anyone who has mentioned symptoms of Covid 19 within the past 7 to 10 days? no   I reviewed procedure/mask/visitor instructions, COVID-19 screening questions with patient.

## 2019-07-25 NOTE — Patient Instructions (Signed)

## 2019-07-25 NOTE — Telephone Encounter (Signed)
Scheduled per 5/24 los. Printed calendar. Pt decline AVS.

## 2019-07-26 ENCOUNTER — Other Ambulatory Visit: Payer: Self-pay

## 2019-07-26 ENCOUNTER — Encounter (HOSPITAL_COMMUNITY)
Admission: RE | Disposition: A | Discharge status: Home / Self Care | Payer: Self-pay | Source: Home / Self Care | Attending: Interventional Cardiology

## 2019-07-26 ENCOUNTER — Ambulatory Visit (HOSPITAL_COMMUNITY)
Admission: RE | Admit: 2019-07-26 | Discharge: 2019-07-26 | Disposition: A | Discharge status: Home / Self Care | Payer: Medicare Other | Attending: Interventional Cardiology | Admitting: Interventional Cardiology

## 2019-07-26 DIAGNOSIS — Z87891 Personal history of nicotine dependence: Secondary | ICD-10-CM | POA: Diagnosis not present

## 2019-07-26 DIAGNOSIS — I452 Bifascicular block: Secondary | ICD-10-CM | POA: Insufficient documentation

## 2019-07-26 DIAGNOSIS — Z856 Personal history of leukemia: Secondary | ICD-10-CM | POA: Insufficient documentation

## 2019-07-26 DIAGNOSIS — Z882 Allergy status to sulfonamides status: Secondary | ICD-10-CM | POA: Insufficient documentation

## 2019-07-26 DIAGNOSIS — K219 Gastro-esophageal reflux disease without esophagitis: Secondary | ICD-10-CM | POA: Insufficient documentation

## 2019-07-26 DIAGNOSIS — I350 Nonrheumatic aortic (valve) stenosis: Secondary | ICD-10-CM

## 2019-07-26 DIAGNOSIS — Z85038 Personal history of other malignant neoplasm of large intestine: Secondary | ICD-10-CM | POA: Insufficient documentation

## 2019-07-26 DIAGNOSIS — I2582 Chronic total occlusion of coronary artery: Secondary | ICD-10-CM | POA: Diagnosis not present

## 2019-07-26 DIAGNOSIS — I25119 Atherosclerotic heart disease of native coronary artery with unspecified angina pectoris: Secondary | ICD-10-CM | POA: Insufficient documentation

## 2019-07-26 DIAGNOSIS — D693 Immune thrombocytopenic purpura: Secondary | ICD-10-CM | POA: Diagnosis not present

## 2019-07-26 DIAGNOSIS — D649 Anemia, unspecified: Secondary | ICD-10-CM | POA: Insufficient documentation

## 2019-07-26 DIAGNOSIS — I5032 Chronic diastolic (congestive) heart failure: Secondary | ICD-10-CM | POA: Diagnosis not present

## 2019-07-26 DIAGNOSIS — Z955 Presence of coronary angioplasty implant and graft: Secondary | ICD-10-CM | POA: Diagnosis not present

## 2019-07-26 DIAGNOSIS — Z79899 Other long term (current) drug therapy: Secondary | ICD-10-CM | POA: Diagnosis not present

## 2019-07-26 DIAGNOSIS — E785 Hyperlipidemia, unspecified: Secondary | ICD-10-CM | POA: Insufficient documentation

## 2019-07-26 DIAGNOSIS — I110 Hypertensive heart disease with heart failure: Secondary | ICD-10-CM | POA: Diagnosis not present

## 2019-07-26 DIAGNOSIS — F419 Anxiety disorder, unspecified: Secondary | ICD-10-CM | POA: Insufficient documentation

## 2019-07-26 DIAGNOSIS — I352 Nonrheumatic aortic (valve) stenosis with insufficiency: Secondary | ICD-10-CM | POA: Insufficient documentation

## 2019-07-26 DIAGNOSIS — I2510 Atherosclerotic heart disease of native coronary artery without angina pectoris: Secondary | ICD-10-CM | POA: Diagnosis not present

## 2019-07-26 HISTORY — PX: RIGHT/LEFT HEART CATH AND CORONARY ANGIOGRAPHY: CATH118266

## 2019-07-26 LAB — POCT I-STAT EG7
Acid-Base Excess: 1 mmol/L (ref 0.0–2.0)
Bicarbonate: 27.4 mmol/L (ref 20.0–28.0)
Calcium, Ion: 1.18 mmol/L (ref 1.15–1.40)
HCT: 36 % — ABNORMAL LOW (ref 39.0–52.0)
Hemoglobin: 12.2 g/dL — ABNORMAL LOW (ref 13.0–17.0)
O2 Saturation: 72 %
Potassium: 3.7 mmol/L (ref 3.5–5.1)
Sodium: 138 mmol/L (ref 135–145)
TCO2: 29 mmol/L (ref 22–32)
pCO2, Ven: 49 mmHg (ref 44.0–60.0)
pH, Ven: 7.356 (ref 7.250–7.430)
pO2, Ven: 40 mmHg (ref 32.0–45.0)

## 2019-07-26 SURGERY — RIGHT/LEFT HEART CATH AND CORONARY ANGIOGRAPHY
Anesthesia: LOCAL

## 2019-07-26 MED ORDER — ACETAMINOPHEN 325 MG PO TABS: 650.0000 mg | ORAL_TABLET | ORAL | Status: DC | PRN

## 2019-07-26 MED ORDER — SODIUM CHLORIDE 0.9% FLUSH: 3.0000 mL | Freq: Two times a day (BID) | INTRAVENOUS | Status: DC

## 2019-07-26 MED ORDER — ONDANSETRON HCL 4 MG/2ML IJ SOLN: 4.0000 mg | Freq: Four times a day (QID) | INTRAMUSCULAR | Status: DC | PRN

## 2019-07-26 MED ORDER — HEPARIN SODIUM (PORCINE) 1000 UNIT/ML IJ SOLN
INTRAMUSCULAR | Status: DC | PRN
  Administered 2019-07-26: 3500 [IU] via INTRAVENOUS

## 2019-07-26 MED ORDER — FENTANYL CITRATE (PF) 100 MCG/2ML IJ SOLN
INTRAMUSCULAR | Status: DC | PRN
  Administered 2019-07-26: 25 ug via INTRAVENOUS

## 2019-07-26 MED ORDER — ASPIRIN 81 MG PO CHEW: 81.0000 mg | CHEWABLE_TABLET | Freq: Every day | ORAL | Status: DC

## 2019-07-26 MED ORDER — HEPARIN (PORCINE) IN NACL 1000-0.9 UT/500ML-% IV SOLN
INTRAVENOUS | Status: AC
  Filled 2019-07-26: qty 1000

## 2019-07-26 MED ORDER — MIDAZOLAM HCL 2 MG/2ML IJ SOLN
INTRAMUSCULAR | Status: AC
  Filled 2019-07-26: qty 2

## 2019-07-26 MED ORDER — SODIUM CHLORIDE 0.9 % IV SOLN: 250.0000 mL | INTRAVENOUS | Status: DC | PRN

## 2019-07-26 MED ORDER — HEPARIN SODIUM (PORCINE) 1000 UNIT/ML IJ SOLN
INTRAMUSCULAR | Status: AC
  Filled 2019-07-26: qty 1

## 2019-07-26 MED ORDER — HYDRALAZINE HCL 20 MG/ML IJ SOLN: 10.0000 mg | INTRAMUSCULAR | Status: DC | PRN

## 2019-07-26 MED ORDER — SODIUM CHLORIDE 0.9% FLUSH: 3.0000 mL | INTRAVENOUS | Status: DC | PRN

## 2019-07-26 MED ORDER — LIDOCAINE HCL (PF) 1 % IJ SOLN
INTRAMUSCULAR | Status: AC
  Filled 2019-07-26: qty 30

## 2019-07-26 MED ORDER — IOHEXOL 350 MG/ML SOLN
INTRAVENOUS | Status: DC | PRN
  Administered 2019-07-26: 60 mL

## 2019-07-26 MED ORDER — ASPIRIN 81 MG PO CHEW: 81.0000 mg | CHEWABLE_TABLET | ORAL | Status: DC

## 2019-07-26 MED ORDER — SODIUM CHLORIDE 0.9 % WEIGHT BASED INFUSION
3.0000 mL/kg/h | INTRAVENOUS | Status: AC
  Administered 2019-07-26: 3 mL/kg/h via INTRAVENOUS

## 2019-07-26 MED ORDER — VERAPAMIL HCL 2.5 MG/ML IV SOLN
INTRAVENOUS | Status: AC
  Filled 2019-07-26: qty 2

## 2019-07-26 MED ORDER — SODIUM CHLORIDE 0.9 % WEIGHT BASED INFUSION
1.0000 mL/kg/h | INTRAVENOUS | Status: DC
  Administered 2019-07-26: 1 mL/kg/h via INTRAVENOUS

## 2019-07-26 MED ORDER — MIDAZOLAM HCL 2 MG/2ML IJ SOLN
INTRAMUSCULAR | Status: DC | PRN
  Administered 2019-07-26 (×2): 0.5 mg via INTRAVENOUS

## 2019-07-26 MED ORDER — VERAPAMIL HCL 2.5 MG/ML IV SOLN
INTRAVENOUS | Status: DC | PRN
  Administered 2019-07-26: 10 mL via INTRA_ARTERIAL

## 2019-07-26 MED ORDER — SODIUM CHLORIDE 0.9 % IV SOLN: INTRAVENOUS | Status: AC

## 2019-07-26 MED ORDER — LIDOCAINE HCL (PF) 1 % IJ SOLN
INTRAMUSCULAR | Status: DC | PRN
  Administered 2019-07-26: 2 mL
  Administered 2019-07-26: 1 mL

## 2019-07-26 MED ORDER — LABETALOL HCL 5 MG/ML IV SOLN: 10.0000 mg | INTRAVENOUS | Status: DC | PRN

## 2019-07-26 MED ORDER — FENTANYL CITRATE (PF) 100 MCG/2ML IJ SOLN
INTRAMUSCULAR | Status: AC
  Filled 2019-07-26: qty 2

## 2019-07-26 MED ORDER — HEPARIN (PORCINE) IN NACL 1000-0.9 UT/500ML-% IV SOLN
INTRAVENOUS | Status: DC | PRN
  Administered 2019-07-26: 500 mL

## 2019-07-26 SURGICAL SUPPLY — 14 items

## 2019-07-26 NOTE — Interval H&P Note (Signed)
Cath Lab Visit (complete for each Cath Lab visit)  Clinical Evaluation Leading to the Procedure:   ACS: No.  Non-ACS:    Anginal Classification: CCS III  Anti-ischemic medical therapy: Minimal Therapy (1 class of medications)  Non-Invasive Test Results: High-risk stress test findings: cardiac mortality >3%/year  Prior CABG: No previous CABG      History and Physical Interval Note:  07/26/2019 11:10 AM  [name]  has presented today for surgery, with the diagnosis of Aortic stenosis.  The various methods of treatment have been discussed with the patient and family. After consideration of risks, benefits and other options for treatment, the patient has consented to  Procedure(s): RIGHT/LEFT HEART CATH AND CORONARY ANGIOGRAPHY (N/A) as a surgical intervention.  The patient's history has been reviewed, patient examined, no change in status, stable for surgery.  I have reviewed the patient's chart and labs.  Questions were answered to the patient's satisfaction.     Belva Crome III

## 2019-07-26 NOTE — Progress Notes (Signed)
When I went In to check on pt's TRB site, Pt was talking on the phone and did not notice that he was bleeding from his radial artery.  3cc air replaced in band. Blood stopped but hematoma started forming at base of TRB.  Pressure held x 10 min but hematoma came back.  Julio Alm RN in and assessed site and replaced TRB with a new band and put in 10cc of air. Site looks good without further bleeding or hematoma.Pt's wife notified that discharge time is delayed

## 2019-07-26 NOTE — Discharge Instructions (Signed)
Radial Site Care  This sheet gives you information about how to care for yourself after your procedure. Your health care provider may also give you more specific instructions. If you have problems or questions, contact your health care provider. What can I expect after the procedure? After the procedure, it is common to have:  Bruising and tenderness at the catheter insertion area. Follow these instructions at home: Medicines  Take over-the-counter and prescription medicines only as told by your health care provider. Insertion site care  Follow instructions from your health care provider about how to take care of your insertion site. Make sure you: ? Wash your hands with soap and water before you change your bandage (dressing). If soap and water are not available, use hand sanitizer. ? Change your dressing as told by your health care provider. ? Leave stitches (sutures), skin glue, or adhesive strips in place. These skin closures may need to stay in place for 2 weeks or longer. If adhesive strip edges start to loosen and curl up, you may trim the loose edges. Do not remove adhesive strips completely unless your health care provider tells you to do that.  Check your insertion site every day for signs of infection. Check for: ? Redness, swelling, or pain. ? Fluid or blood. ? Pus or a bad smell. ? Warmth.  Do not take baths, swim, or use a hot tub until your health care provider approves.  You may shower 24-48 hours after the procedure, or as directed by your health care provider. ? Remove the dressing and gently wash the site with plain soap and water. ? Pat the area dry with a clean towel. ? Do not rub the site. That could cause bleeding.  Do not apply powder or lotion to the site. Activity   For 24 hours after the procedure, or as directed by your health care provider: ? Do not flex or bend the affected arm. ? Do not push or pull heavy objects with the affected arm. ? Do not  drive yourself home from the hospital or clinic. You may drive 24 hours after the procedure unless your health care provider tells you not to. ? Do not operate machinery or power tools.  Do not lift anything that is heavier than 10 lb (4.5 kg), or the limit that you are told, until your health care provider says that it is safe.  Ask your health care provider when it is okay to: ? Return to work or school. ? Resume usual physical activities or sports. ? Resume sexual activity. General instructions  If the catheter site starts to bleed, raise your arm and put firm pressure on the site. If the bleeding does not stop, get help right away. This is a medical emergency.  If you went home on the same day as your procedure, a responsible adult should be with you for the first 24 hours after you arrive home.  Keep all follow-up visits as told by your health care provider. This is important. Contact a health care provider if:  You have a fever.  You have redness, swelling, or yellow drainage around your insertion site. Get help right away if:  You have unusual pain at the radial site.  The catheter insertion area swells very fast.  The insertion area is bleeding, and the bleeding does not stop when you hold steady pressure on the area.  Your arm or hand becomes pale, cool, tingly, or numb. These symptoms may represent a serious problem   that is an emergency. Do not wait to see if the symptoms will go away. Get medical help right away. Call your local emergency services (911 in the [place] Do not drive yourself to the hospital. Summary  After the procedure, it is common to have bruising and tenderness at the site.  Follow instructions from your health care provider about how to take care of your radial site wound. Check the wound every day for signs of infection.  Do not lift anything that is heavier than 10 lb (4.5 kg), or the limit that you are told, until your health care provider says  that it is safe. This information is not intended to replace advice given to you by your health care provider. Make sure you discuss any questions you have with your health care provider. Document Revised: 03/25/2017 Document Reviewed: 03/25/2017 Elsevier Patient Education  2020 [employer]  

## 2019-07-26 NOTE — H&P (Signed)
Cardiology Preprocedure Note   Date:  06/15/2019   ID:  [name], DOB 1923-10-23, MRN [id number]  PCP:  [name], MD  Cardiologist:  Sinclair Grooms, MD   Referring MD: [name], MD   Symptomatic aortic stenosis   History of Present Illness:    [name] is a 86 y.o. male with a hx of calcific aortic stenosis, chronic coronary artery disease with overlapping DES and mid RCA 2016, residual LAD disease, hyperlipidemia, chronic diastolic heart failure, and Harry cell leukemia with chronic anemia and  thrombocytopenia.  Brach is having exertional dyspnea, fatigue, and exhaustion with activities that could be done relatively easily 6 to 12 months ago.  Aortic stenosis was noted to be moderate when last evaluated 1 year ago by echocardiography when the peak velocity was 3.7 m/s.  Because of increasing symptoms, the echo was repeated on June 13, 2019 and velocity documented to be 4.1 m/s with a mean gradient of 43 mmHg and estimated peak gradient of 67 mmHg.  Because of the patient's age, we had a conversation concerning treatment options.  He is relatively fit despite his history of hairy cell leukemia with chronic anemia and thrombocytopenia.  His recent hemoglobin was 12 and platelet count 43,000 on May 31, 2019.  Both of the hemoglobin and platelet count is slightly lower than they were in December 2020.  He denies angina, orthopnea, syncope, and peripheral edema.  He has a history of coronary artery disease with with tandem stents placed in the right coronary in 2016.  Known total occlusion of the distal LAD with collaterals.  Since the decision to perform coronary angiography and refer to the aortic valve structural heart clinic, we have enlisted the help of Dr. Kavin Leech to help improve the patient's platelet count which was initially less than 30,000.  Patient has idiopathic thrombocytopenia.  The patient has been placed on prednisone and Nplate and  the most recent platelet count performed yesterday was 176,000.  In the interim since last being seen, he notices dyspnea on exertion with less activity than even 6 weeks ago.  He is not symptomatic at rest.  He denies angina.He has not noted dizziness or syncope.  Past Medical History:  Diagnosis Date  . Acute appendicitis   . Anemia   . Anxiety   . Arthritis    "back" (03/14/2014)  . CAD (coronary artery disease)    a. 03/14/14  s/p overlapping DES x2 to mid-distal RCA.  . 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 Colon cancer ([place]) 1984  . Compression fracture of lumbar spine, non-traumatic ([place])   . DJD (degenerative joint disease) of lumbar spine   . Dyslipidemia   . Elevated PSA   . GERD (gastroesophageal reflux disease)   . Glaucoma   . Hairy cell leukemia ([place]) dx'd 1980  . History of blood transfusion    "several; related to hairy cell leukemia & tx "  . History of stomach ulcers 1968  . Hypertension   . Malnutrition ([place])   . Osteoarthritis   . Osteoporosis   . Skin cancer of face     Past Surgical History:  Procedure Laterality Date  . APPENDECTOMY  10/2007  . CATARACT EXTRACTION, BILATERAL Bilateral 02/2008  . COLON SURGERY  1984   "sigmoid; open"  . CORONARY ANGIOPLASTY WITH STENT PLACEMENT  03/14/2014   "2"  . FRACTURE SURGERY    . LEFT HEART CATHETERIZATION WITH CORONARY ANGIOGRAM N/A 03/14/2014  Procedure: LEFT HEART CATHETERIZATION WITH CORONARY ANGIOGRAM;  Surgeon: Peter M Martinique, MD;  Location: [facility] CATH LAB;  Service: Cardiovascular;  Laterality: N/A;  . LIPOMA EXCISION Right 07/2011   liposarcoma resection; "back"  . MOHS SURGERY Left 02/2011  . MOHS SURGERY  X 3   "all on my face"  . MOLE REMOVAL Left 1985   cheek  . ORIF ANKLE FRACTURE Left 2000  . SHOULDER SURGERY  04/2004  . SPLENECTOMY  1992  . TONSILLECTOMY AND ADENOIDECTOMY  1939    Current Medications: Current Meds  Medication Sig  . atorvastatin (LIPITOR) 20 MG tablet  TAKE 1 TABLET BY MOUTH DAILY AT 6 PM  . Cholecalciferol (VITAMIN D-3 PO) Take 1,000 Units by mouth daily.   Marland Kitchen desonide (DESOWEN) 0.05 % cream Apply 1 application topically 2 (two) times daily as needed. (Skin Irritation)  . fluocinonide (LIDEX) 0.05 % external solution APP TOPICALLY TO SCALP AND EARS twice daily as needed FOR DERMATITIS  . LATANOPROST OP Apply 1 drop to eye at bedtime. In each eye  . metoprolol tartrate (LOPRESSOR) 25 MG tablet TAKE ONE-HALF TABLET BY MOUTH DAILY PLEASE KEEP YOUR UPCOMING APPOINTMENT WITH DOCTOR Shadasia Oldfield IN [place] FOR FUTURE REFILLS. THANK YOU  . Multiple Vitamins-Minerals (PRESERVISION AREDS 2 PO) Take 1 capsule by mouth 2 (two) times daily.   . nitroGLYCERIN (NITROSTAT) 0.4 MG SL tablet Place 1 tablet (0.4 mg total) under the tongue every 5 (five) minutes as needed for chest pain. Please make yearly appt with Dr. Tamala Julian for September. 1st attempt  . ranibizumab (LUCENTIS) 0.5 MG/0.05ML SOLN Inject 0.5 mg into the LEFT eye every four (4) weeks.  . sertraline (ZOLOFT) 50 MG tablet Take 50 mg by mouth daily.  . tamsulosin (FLOMAX) 0.4 MG CAPS capsule Take 0.8 mg by mouth daily after supper.   . timolol (BETIMOL) 0.5 % ophthalmic solution Place 1 drop into the right eye daily.      Allergies:   Antazoline; Antihistamines, chlorpheniramine-type; and Sulfa antibiotics   Social History   Socioeconomic History  . Marital status: Married    Spouse name: Not on file  . Number of children: Not on file  . Years of education: Not on file  . Highest education level: Not on file  Occupational History  . Not on file  Tobacco Use  . Smoking status: Former Smoker    Packs/day: 0.25    Years: 1.00    Pack years: 0.25    Types: Cigarettes, Cigars  . Smokeless tobacco: Never Used  . Tobacco comment: occasional social smoker during college.  Substance and Sexual Activity  . Alcohol use: Yes    Alcohol/week: 9.0 standard drinks    Types: 2 Glasses of wine, 2 Shots of  liquor, 5 Standard drinks or equivalent per week  . Drug use: No  . Sexual activity: Never  Other Topics Concern  . Not on file  Social History Narrative  . Not on file   Social Determinants of Health   Financial Resource Strain:   . Difficulty of Paying Living Expenses:   Food Insecurity:   . Worried About Charity fundraiser in the Last Year:   . Arboriculturist in the Last Year:   Transportation Needs:   . Film/video editor (Medical):   Marland Kitchen Lack of Transportation (Non-Medical):   Physical Activity:   . Days of Exercise per Week:   . Minutes of Exercise per Session:   Stress:   . Feeling of  Stress :   Social Connections:   . Frequency of Communication with Friends and Family:   . Frequency of Social Gatherings with Friends and Family:   . Attends Religious Services:   . Active Member of Clubs or Organizations:   . Attends Archivist Meetings:   Marland Kitchen Marital Status:      Family History: The patient's family history includes Congestive Heart Failure (age of onset: 25) in his father; Diabetes Mellitus II in his brother; Heart Problems in his brother; Prostate cancer in his brother; Stroke in his mother.  ROS:   Please see the history of present illness.    Numbness, stiffness, and weakness in his legs.  Has a difficult time arising from sitting position.  No difficulty with ambulation or worsening in leg fatigue with ambulation.  Has lumbar disc disease.  May have spinal stenosis.  Surgery greater than 40 years ago.  All other systems reviewed and are negative.  EKGs/Labs/Other Studies Reviewed:    The following studies were reviewed today:  ECHOCARDIOGRAPHY 06/13/2019: IMPRESSIONS    1. Left ventricular ejection fraction, by estimation, is 60 to 65%. The  left ventricle has normal function. The left ventricle has no regional  wall motion abnormalities. Left ventricular diastolic parameters are  consistent with Grade I diastolic  dysfunction (impaired  relaxation). Elevated left atrial pressure.  2. Right ventricular systolic function is normal. The right ventricular  size is normal. There is mildly elevated pulmonary artery systolic  pressure.  3. Left atrial size was mildly dilated.  4. The mitral valve is abnormal. Mild mitral valve regurgitation.  5. AV is thickened, calcified with restricted motion. Peak and mean  gradients through the valve are 67 and 43 mm Hg respectively. Thisk in  increased from echo report of 2020. LVOT/AV VTI ratio is 024 consistent  with severe AS.Marland Kitchen The aortic valve is  tricuspid. Aortic valve regurgitation is moderate.  6. Aortic dilatation noted. There is mild dilatation of the ascending  aorta measuring 39 mm.    Cardiac catheterization January 2016: Final Conclusions:   1. Severe 2 vessel obstructive CAD. The patient has chronic total occlusion of the distal LAD with collaterals. There is a severe lesion in the LAD after the second diagonal but this lesion supplies only a small third diagonal branch. The mid RCA had a subtotal occlusion. 2. Normal LV function 3.  PCI:PCI Data: Vessel - RCA/Segment - mid and distal Percent Stenosis (pre)  99% and 80% TIMI-flow 1 Stent 2.5 x 20 mm Promus and 2.25 x 16 mm Promus respectively Percent Stenosis (post) 0% TIMI-flow (post) 3  EKG:  EKG sinus bradycardia, left atrial abnormality, PR interval of 186 ms, right bundle branch block, left anterior hemiblock.  No change when compared to the prior tracing from January 06, 2019.  Recent Labs: No results found for requested labs within last 8760 hours.  Recent Lipid Panel    Component Value Date/Time   CHOL 122 (L) 12/10/2015 0949   TRIG 154 (H) 12/10/2015 0949   HDL 33 (L) 12/10/2015 0949   CHOLHDL 3.7 12/10/2015 0949   VLDL 31 (H) 12/10/2015 0949   LDLCALC 58 12/10/2015 0949    Physical Exam:    VS:  BP 128/68   Pulse 68   Ht 5\' 4"  (1.626 m)   Wt 167 lb 12.8 oz (76.1 kg)   SpO2 96%   BMI 28.80  kg/m     Wt Readings from Last 3 Encounters:  06/15/19 167  lb 12.8 oz (76.1 kg)  01/06/19 164 lb (74.4 kg)  11/20/17 170 lb 3.2 oz (77.2 kg)     GEN: Appears younger than stated age.  He is ambulatory without difficulty.. No acute distress HEENT: Normal NECK: No JVD. LYMPHATICS: No lymphadenopathy CARDIAC: 4/6 crescendo decrescendo systolic murmur compatible with aortic stenosis.  RRR without no diastolic murmur, gallop, or edema. VASCULAR:  Normal Pulses. No bruits. RESPIRATORY:  Clear to auscultation without rales, wheezing or rhonchi  ABDOMEN: Soft, non-tender, non-distended, No pulsatile mass, MUSCULOSKELETAL: No deformity  SKIN: Warm and dry NEUROLOGIC:  Alert and oriented x 3 PSYCHIATRIC:  Normal affect   ASSESSMENT:    1. Severe aortic stenosis   2. Angina pectoris ([place])   3. Dyslipidemia   4. Essential hypertension   5. Thrombocytopenia ([place])   6. Educated about COVID-19 virus infection   7. Bifascicular block    PLAN:    In order of problems listed above:  1. Symptomatic aortic stenosis.  After significant candid conversation with the patient, we will pursue transaortic valve replacement therapy if possible.  He is 86 years of age but quite fit and still independently engages in physical activity such as boating, walking for exercise, etc.  He would not be interested and open heart surgery.  Left and right heart cath with coronary angiography will be performed prior to referral for consideration of TAVR. 2. Coronary angiography will reassess coronary anatomy.  Known total occlusion of the distal LAD.  5 years ago had tandem stents placed in the right coronary for high-grade disease with success.  There is no current angina. 3. LDL target is less than 70 with most recent LDL of 54. 4. Target blood pressure 140/80 mmHg or less.  He is on ultra low-dose metoprolol. 5. He has hairy cell leukemia with anemia and thrombocytopenia.  Last platelet count was less than 50,000.   I will need to contact hematology/on[facility] at [facility] to get an opinion concerning both anemia and thrombocytopenia preparation for TAVR. 6. He has received the COVID-19 vaccine.  He is practicing social distancing. 7. Discussed the increased risk of high-grade AV block requiring pacemaker after TAVR.  Overall education and awareness concerning primary/secondary risk prevention was discussed in detail: LDL less than 70, hemoglobin A1c less than 7, blood pressure target less than 130/80 mmHg, >150 minutes of moderate aerobic activity per week, avoidance of smoking, weight control (via diet and exercise), and continued surveillance/management of/for obstructive sleep apnea.  Natural history of aortic valve stenosis was discussed in detail.  Cardinal symptoms of angina, syncope, and dyspnea were reviewed and significance relative to prognosis was described.  The importance of sequential imaging for disease monitoring was emphasized.  Work-up including possible heart catheterization and CT angiography were described as essential components of staging for therapy.  Treatment options, TAVR and SAVR, were discussed in some detail with emphasis on TAVR.  With TAVR he will be at increased risk for pacemaker requirement.   Again today we discussed the indications for left and right heart catheterization with the major information being documentation of pain in coronary arteries. The patient was counseled to undergo left heart catheterization, coronary angiography, and possible percutaneous coronary intervention with stent implantation. The procedural risks and benefits were discussed in detail. The risks discussed included death, stroke, myocardial infarction, life-threatening bleeding, limb ischemia, kidney injury, allergy, and possible emergency cardiac surgery. The risk of these significant complications were estimated to occur less than 1% of the time.  After discussion, the patient has  agreed to proceed.

## 2019-07-27 ENCOUNTER — Other Ambulatory Visit: Payer: Self-pay

## 2019-07-27 DIAGNOSIS — I350 Nonrheumatic aortic (valve) stenosis: Secondary | ICD-10-CM

## 2019-07-27 LAB — POCT I-STAT 7, (LYTES, BLD GAS, ICA,H+H)
Acid-Base Excess: 0 mmol/L (ref 0.0–2.0)
Bicarbonate: 26.1 mmol/L (ref 20.0–28.0)
Calcium, Ion: 1.22 mmol/L (ref 1.15–1.40)
HCT: 37 % — ABNORMAL LOW (ref 39.0–52.0)
Hemoglobin: 12.6 g/dL — ABNORMAL LOW (ref 13.0–17.0)
O2 Saturation: 99 %
Potassium: 3.8 mmol/L (ref 3.5–5.1)
Sodium: 138 mmol/L (ref 135–145)
TCO2: 28 mmol/L (ref 22–32)
pCO2 arterial: 46.5 mmHg (ref 32.0–48.0)
pH, Arterial: 7.358 (ref 7.350–7.450)
pO2, Arterial: 144 mmHg — ABNORMAL HIGH (ref 83.0–108.0)

## 2019-07-28 ENCOUNTER — Other Ambulatory Visit: Payer: Self-pay

## 2019-07-28 ENCOUNTER — Encounter: Payer: Self-pay | Admitting: Cardiovascular Disease

## 2019-07-28 ENCOUNTER — Ambulatory Visit (INDEPENDENT_AMBULATORY_CARE_PROVIDER_SITE_OTHER): Payer: Medicare Other | Admitting: Cardiovascular Disease

## 2019-07-28 VITALS — BP 128/60 | HR 59 | Ht 64.0 in | Wt 171.0 lb

## 2019-07-28 DIAGNOSIS — I209 Angina pectoris, unspecified: Secondary | ICD-10-CM | POA: Diagnosis not present

## 2019-07-28 DIAGNOSIS — I350 Nonrheumatic aortic (valve) stenosis: Secondary | ICD-10-CM | POA: Diagnosis not present

## 2019-07-28 NOTE — Progress Notes (Signed)
[facility] Consult Note  Chief Complaint  Patient presents with  . New Patient (Initial Visit)    severe aortic stenosis   History of Present Illness: 86 yo male with history of idiopathic thrombocytopenia, anemia, anxiety, CAD, colon cancer, hyperlipidemia, GERD, hairy cell leukemia, HTN and severe aortic stenosis who is here today as a new consult, referred by Dr. Tamala Julian for further discussion regarding his severe aortic stenosis and possible TAVR. He has been followed by Dr. Tamala Julian for moderate AS. Most recent echo 06/13/19 with LVEF=60-65%. The aortic valve leaflets are thickened and calcified with limited leaflet mobility. The mean gradient is 43 mmHg, peak gradient 67 mmHg, AVA 0.73 cm2. Dimensionless index 0.24. This is consistent with severe AS. Moderate AI. Mild MR. Cardiac cath 07/26/19 with chronically occluded mid LAD, moderate disease in the diagonals and mid RCA. Medical therapy recommended for CAD. He has chronic idiopathic thrombocytopenia and is followed by Dr. Benay Spice and has received Nplate therapy and prednisone. Latest platelets over 150,000.   He tells me today that he has had progressive dyspnea and fatigue for several years. No chest pain. No LE edema or dizziness. He has leg weakness with exercise. He has his own teeth. He has no active dental issues. He sees the dentist regularly. He is retired from [employer]. He lives here in [place] with his wife.   Primary Care Physician: [name], MD Primary Cardiologist: Daneen Schick Referring Cardiologist: Daneen Schick  Past Medical History:  Diagnosis Date  . Acute appendicitis   . Anemia   . Anxiety   . Arthritis    "back" (03/14/2014)  . CAD (coronary artery disease)    a. 03/14/14  s/p overlapping DES x2 to mid-distal RCA.  . 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 Colon cancer ([place]) 1984  . Compression fracture of lumbar spine, non-traumatic ([place])   . DJD  (degenerative joint disease) of lumbar spine   . Dyslipidemia   . Elevated PSA   . GERD (gastroesophageal reflux disease)   . Glaucoma   . Hairy cell leukemia ([place]) dx'd 1980  . History of blood transfusion    "several; related to hairy cell leukemia & tx "  . History of stomach ulcers 1968  . Hypertension   . Malnutrition ([place])   . Osteoarthritis   . Osteoporosis   . Skin cancer of face     Past Surgical History:  Procedure Laterality Date  . APPENDECTOMY  10/2007  . CATARACT EXTRACTION, BILATERAL Bilateral 02/2008  . COLON SURGERY  1984   "sigmoid; open"  . CORONARY ANGIOPLASTY WITH STENT PLACEMENT  03/14/2014   "2"  . FRACTURE SURGERY    . LEFT HEART CATHETERIZATION WITH CORONARY ANGIOGRAM N/A 03/14/2014   Procedure: LEFT HEART CATHETERIZATION WITH CORONARY ANGIOGRAM;  Surgeon: Peter M Martinique, MD;  Location[facility] CATH LAB;  Service: Cardiovascular;  Laterality: N/A;  . LIPOMA EXCISION Right 07/2011   liposarcoma resection; "back"  . MOHS SURGERY Left 02/2011  . MOHS SURGERY  X 3   "all on my face"  . MOLE REMOVAL Left 1985   cheek  . ORIF ANKLE FRACTURE Left 2000  . RIGHT/LEFT HEART CATH AND CORONARY ANGIOGRAPHY N/A 07/26/2019   Procedure: RIGHT/LEFT HEART CATH AND CORONARY ANGIOGRAPHY;  Surgeon: Belva Crome, MD;  Location: [facility] CV LAB;  Service: Cardiovascular;  Laterality: N/A;  . SHOULDER SURGERY  04/2004  . SPLENECTOMY  1992  . [place]  Current Outpatient Medications  Medication Sig Dispense Refill  . atorvastatin (LIPITOR) 20 MG tablet TAKE 1 TABLET BY MOUTH DAILY AT 6 PM (Patient taking differently: Take 20 mg by mouth daily at 6 PM. ) 90 tablet 3  . Cholecalciferol (VITAMIN D-3 PO) Take 2,000 Units by mouth at bedtime.     Marland Kitchen desonide (DESOWEN) 0.05 % cream Apply 1 application topically 2 (two) times daily as needed (irritation).     . fluocinonide (LIDEX) 0.05 % external solution Apply 1 application topically 2 (two) times  daily as needed (apply to ears).   11  . latanoprost (XALATAN) 0.005 % ophthalmic solution Place 1 drop into both eyes at bedtime.     . metoprolol tartrate (LOPRESSOR) 25 MG tablet TAKE ONE-HALF TABLET BY MOUTH DAILY PLEASE KEEP YOUR UPCOMING APPOINTMENT WITH DOCTOR SMITH IN [place] FOR FUTURE REFILLS. THANK YOU (Patient taking differently: Take 12.5 mg by mouth daily. ) 45 tablet 5  . Multiple Vitamins-Minerals (PRESERVISION AREDS 2 PO) Take 1 capsule by mouth 2 (two) times daily.     . nitroGLYCERIN (NITROSTAT) 0.4 MG SL tablet Place 1 tablet (0.4 mg total) under the tongue every 5 (five) minutes as needed for chest pain. Please make yearly appt with Dr. Tamala Julian for September. 1st attempt 25 tablet 2  . pantoprazole (PROTONIX) 40 MG tablet Take 40 mg by mouth daily.    . predniSONE (DELTASONE) 10 MG tablet Take 4 tablets (40 mg total) by mouth daily with breakfast. (Patient taking differently: Take 30 mg by mouth daily with breakfast. ) 112 tablet 0  . ranibizumab (LUCENTIS) 0.5 MG/0.05ML SOLN 0.5 mg by Intravitreal route every 28 (twenty-eight) days.     Marland Kitchen sertraline (ZOLOFT) 50 MG tablet Take 50 mg by mouth every evening.     . tamsulosin (FLOMAX) 0.4 MG CAPS capsule Take 0.8 mg by mouth daily after supper.     . timolol (BETIMOL) 0.5 % ophthalmic solution Place 1 drop into the right eye daily.     Marland Kitchen triamcinolone cream (KENALOG) 0.1 % Apply 1 application topically 2 (two) times daily as needed (irritation).     No current facility-administered medications for this visit.    Allergies  Allergen Reactions  . Antazoline Anaphylaxis  . Antihistamines, Chlorpheniramine-Type     Nervous, jittery  . Sulfa Antibiotics Rash    Social History   Socioeconomic History  . Marital status: Married    Spouse name: Not on file  . Number of children: Not on file  . Years of education: Not on file  . Highest education level: Not on file  Occupational History  . Not on file  Tobacco Use  .  Smoking status: Former Smoker    Packs/day: 0.25    Years: 1.00    Pack years: 0.25    Types: Cigarettes, Cigars  . Smokeless tobacco: Never Used  . Tobacco comment: occasional social smoker during college.  Substance and Sexual Activity  . Alcohol use: Yes    Alcohol/week: 9.0 standard drinks    Types: 2 Glasses of wine, 2 Shots of liquor, 5 Standard drinks or equivalent per week  . Drug use: No  . Sexual activity: Never  Other Topics Concern  . Not on file  Social History Narrative  . Not on file   Social Determinants of Health   Financial Resource Strain:   . Difficulty of Paying Living Expenses:   Food Insecurity:   . Worried About Charity fundraiser in the Last  Year:   . Ran Out of Food in the Last Year:   Transportation Needs:   . Film/video editor (Medical):   Marland Kitchen Lack of Transportation (Non-Medical):   Physical Activity:   . Days of Exercise per Week:   . Minutes of Exercise per Session:   Stress:   . Feeling of Stress :   Social Connections:   . Frequency of Communication with Friends and Family:   . Frequency of Social Gatherings with Friends and Family:   . Attends Religious Services:   . Active Member of Clubs or Organizations:   . Attends Archivist Meetings:   Marland Kitchen Marital Status:   Intimate Partner Violence:   . Fear of Current or Ex-Partner:   . Emotionally Abused:   Marland Kitchen Physically Abused:   . Sexually Abused:     Family History  Problem Relation Age of Onset  . Congestive Heart Failure Father 74  . Stroke Mother   . Diabetes Mellitus II Brother   . Prostate cancer Brother   . Heart Problems Brother        CABG    Review of Systems:  As stated in the HPI and otherwise negative.   BP 128/60   Pulse (!) 59   Ht 5\' 4"  (1.626 m)   Wt 171 lb (77.6 kg)   SpO2 94%   BMI 29.35 kg/m   Physical Examination: General: Well developed, well nourished, NAD  HEENT: OP clear, mucus membranes moist  SKIN: warm, dry. No rashes. Neuro: No  focal deficits  Musculoskeletal: Muscle strength 5/5 all ext  Psychiatric: Mood and affect normal  Neck: No JVD, no carotid bruits, no thyromegaly, no lymphadenopathy.  Lungs:Clear bilaterally, no wheezes, rhonci, crackles Cardiovascular: Regular rate and rhythm. Loud, harsh, late peaking systolic murmur.  Abdomen:Soft. Bowel sounds present. Non-tender.  Extremities: No lower extremity edema. Pulses are 2 + in the bilateral DP/PT.  Echo 06/13/19:  1. Left ventricular ejection fraction, by estimation, is 60 to 65%. The  left ventricle has normal function. The left ventricle has no regional  wall motion abnormalities. Left ventricular diastolic parameters are  consistent with Grade I diastolic  dysfunction (impaired relaxation). Elevated left atrial pressure.  2. Right ventricular systolic function is normal. The right ventricular  size is normal. There is mildly elevated pulmonary artery systolic  pressure.  3. Left atrial size was mildly dilated.  4. The mitral valve is abnormal. Mild mitral valve regurgitation.  5. AV is thickened, calcified with restricted motion. Peak and mean  gradients through the valve are 67 and 43 mm Hg respectively. Thisk in  increased from echo report of 2020. LVOT/AV VTI ratio is 024 consistent  with severe AS.Marland Kitchen The aortic valve is  tricuspid. Aortic valve regurgitation is moderate.  6. Aortic dilatation noted. There is mild dilatation of the ascending  aorta measuring 39 mm.   Comparison(s): 12/27/18 EF 60-65%. Moderate AS 81mmHg mean PG.   FINDINGS  Left Ventricle: Left ventricular ejection fraction, by estimation, is 60  to 65%. The left ventricle has normal function. The left ventricle has no  regional wall motion abnormalities. The left ventricular internal cavity  size was normal in size. There is  no left ventricular hypertrophy. Left ventricular diastolic parameters  are consistent with Grade I diastolic dysfunction (impaired  relaxation).  Elevated left atrial pressure.   Right Ventricle: The right ventricular size is normal. No increase in  right ventricular wall thickness. Right ventricular systolic function is  normal.  There is mildly elevated pulmonary artery systolic pressure. The  tricuspid regurgitant velocity is 2.74  m/s, and with an assumed right atrial pressure of 8 mmHg, the estimated  right ventricular systolic pressure is 99991111 mmHg.   Left Atrium: Left atrial size was mildly dilated.   Right Atrium: Right atrial size was normal in size.   Pericardium: Trivial pericardial effusion is present.   Mitral Valve: The mitral valve is abnormal. There is mild thickening of  the mitral valve leaflet(s). Mild mitral valve regurgitation.   Tricuspid Valve: The tricuspid valve is normal in structure. Tricuspid  valve regurgitation is mild.   Aortic Valve: AV is thickened, calcified with restricted motion. Peak and  mean gradients through the valve are 67 and 43 mm Hg respectively. This id  increased from echo report of 2020. LVOT/AV VTI ratio is 0.24 consistent  with severe AS. The aortic valve  is tricuspid. Aortic valve regurgitation is moderate. Aortic  regurgitation PHT measures 509 msec. Aortic valve area, by VTI measures  0.76 cm.   Pulmonic Valve: The pulmonic valve was normal in structure. Pulmonic valve  regurgitation is trivial.   Aorta: Aortic dilatation noted. There is mild dilatation of the ascending  aorta measuring 39 mm.   IAS/Shunts: No atrial level shunt detected by color flow Doppler.     LEFT VENTRICLE  PLAX 2D  LVIDd:     4.80 cm Diastology  LVIDs:     3.25 cm LV e' lateral:  6.18 cm/s  LV PW:     0.90 cm LV E/e' lateral: 21.5  LV IVS:    0.80 cm LV e' medial:  2.68 cm/s  LVOT diam:   2.00 cm LV E/e' medial: 49.6  LV SV:     95  LV SV Index:  53    2D Longitudinal Strain  LVOT Area:   3.14 cm 2D Strain GLS (A2C):  -17.9 %              2D Strain GLS (A3C):  -19.9 %             2D Strain GLS (A4C):  -24.7 %             2D Strain GLS Avg:   -20.9 %               3D Volume EF:             3D EF:    56 %             LV EDV:    129 ml             LV ESV:    57 ml             LV SV:    72 ml   RIGHT VENTRICLE  RV Basal diam: 5.10 cm  RV Mid diam:  3.40 cm  RV S prime:   11.10 cm/s  TAPSE (M-mode): 2.7 cm  RVSP:      38.0 mmHg   LEFT ATRIUM       Index    RIGHT ATRIUM      Index  LA diam:    4.20 cm 2.34 cm/m RA Pressure: 8.00 mmHg  LA Vol (A2C):  82.1 ml 45.66 ml/m RA Area:   13.20 cm  LA Vol (A4C):  51.6 ml 28.70 ml/m RA Volume:  32.70 ml 18.19 ml/m  LA Biplane Vol: 66.7 ml 37.09 ml/m  AORTIC VALVE  AV  Area (Vmax):  0.73 cm  AV Area (Vmean):  0.76 cm  AV Area (VTI):   0.76 cm  AV Vmax:      410.00 cm/s  AV Vmean:     313.500 cm/s  AV VTI:      1.255 m  AV Peak Grad:   67.2 mmHg  AV Mean Grad:   43.0 mmHg  LVOT Vmax:     95.80 cm/s  LVOT Vmean:    76.000 cm/s  LVOT VTI:     0.302 m  LVOT/AV VTI ratio: 0.24  AI PHT:      509 msec    AORTA  Ao Root diam: 3.80 cm  Ao Asc diam: 3.90 cm   MITRAL VALVE        TRICUSPID VALVE               TR Peak grad:  30.0 mmHg               TR Vmax:    274.00 cm/s  MV E velocity: 133.00 cm/s Estimated RAP: 8.00 mmHg  MV A velocity: 131.00 cm/s RVSP:      38.0 mmHg  MV E/A ratio: 1.02               SHUNTS               Systemic VTI: 0.30 m               Systemic Diam: 2.00 cm   Cardiac cath 07/26/19:  Diagnostic Dominance: Right Left Anterior Descending  Collaterals  Dist LAD filled by collaterals from RPDA.    Mid LAD to Dist LAD lesion 100% stenosed    Mid LAD to Dist LAD lesion is 100% stenosed.  First Diagonal Branch  1st Diag lesion 65% stenosed  1st Diag lesion is 65% stenosed.  First Septal Branch  Vessel is small in size.  Third Diagonal Branch  3rd Diag lesion 70% stenosed  3rd Diag lesion is 70% stenosed.  Left Circumflex  Vessel is small.  First Obtuse Marginal Branch  Vessel is small in size.  Second Obtuse Marginal Branch  Vessel is small in size.  Third Obtuse Marginal Branch  Vessel is small in size.  Right Coronary Artery  Prox RCA to Mid RCA lesion 60% stenosed  Prox RCA to Mid RCA lesion is 60% stenosed.  Mid RCA to Dist RCA lesion 10% stenosed  Mid RCA to Dist RCA lesion is 10% stenosed. The lesion was previously treated.  Right Posterior Descending Artery  RPDA lesion 40% stenosed  RPDA lesion is 40% stenosed.  Intervention  No interventions have been documented. Right Heart  Right Heart Pressures Hemodynamic findings consistent with pulmonary hypertension. LV EDP is normal.  Right Atrium The right atrial size is normal.  Left Heart  Left Ventricle LV end diastolic pressure is normal.  Aortic Valve There is severe aortic valve stenosis. The aortic valve is calcified.  Coronary Diagrams  Diagnostic Dominance: Right  Intervention  Implants   No implant documentation for this case.  Syngo Images  Show images for CARDIAC CATHETERIZATION  Images on Long Term Storage  Show images for [place], [place] to Procedure Log  Procedure Log    Hemo Data   Most Recent Value  Fick Cardiac Output 5.48 L/min  Fick Cardiac Output Index 3.02 (L/min)/BSA  Aortic Mean Gradient 32.24 mmHg  Aortic Peak Gradient 48 mmHg  Aortic Valve Area 1.56  Aortic Value  Area Index 0.86 cm2/BSA  RA A Wave 9 mmHg  RA V Wave 8 mmHg  RA Mean 6 mmHg  RV Systolic Pressure 33 mmHg  RV Diastolic Pressure 4 mmHg  RV EDP 8 mmHg  PA Systolic Pressure 32 mmHg  PA Diastolic Pressure 10 mmHg  PA Mean 19 mmHg  PW A  Wave 16 mmHg  PW V Wave 14 mmHg  PW Mean 11 mmHg  AO Systolic Pressure [id number] mmHg  AO Diastolic Pressure 53 mmHg  AO Mean 78 mmHg  LV Systolic Pressure [id number] mmHg  LV Diastolic Pressure 5 mmHg  LV EDP 17 mmHg  AOp Systolic Pressure [id number] mmHg  AOp Diastolic Pressure 51 mmHg  AOp Mean Pressure 75 mmHg  LVp Systolic Pressure [id number] mmHg  LVp Diastolic Pressure 8 mmHg  LVp EDP Pressure 17 mmHg  QP/QS 1  TPVR Index 6.29 HRUI  TSVR Index 27.48 HRUI  PVR SVR Ratio 0.1  TPVR/TSVR Ratio 0.23     Recent Labs: 07/08/2019: BUN 25; Creatinine 1.04 07/25/2019: Platelet Count 176 07/26/2019: Hemoglobin 12.6; Potassium 3.8; Sodium 138    Wt Readings from Last 3 Encounters:  07/28/19 171 lb (77.6 kg)  07/26/19 168 lb (76.2 kg)  07/25/19 169 lb (76.7 kg)      Assessment and Plan:   1. Severe Aortic Valve Stenosis: He has severe, stage D aortic valve stenosis. I have personally reviewed the echo images. The aortic valve is thickened, calcified with limited leaflet mobility. I think he would benefit from AVR. Given advanced age, he is not a good candidate for conventional AVR by surgical approach. I think he may be a good candidate for TAVR.   STS Risk Score: Risk of Mortality: 3.962% Renal Failure: 2.933% Permanent Stroke: 1.809% Prolonged Ventilation: 6.856% DSW Infection: 0.188% Reoperation: 4.374% Morbidity or Mortality: 12.741% Short Length of Stay: 21.118% Long Length of Stay: 7.149%  I have reviewed the natural history of aortic stenosis with the patient and their family members  who are present today. We have discussed the limitations of medical therapy and the poor prognosis associated with symptomatic aortic stenosis. We have reviewed potential treatment options, including palliative medical therapy, conventional surgical aortic valve replacement, and transcatheter aortic valve replacement. We discussed treatment options in the context of the patient's specific comorbid medical  conditions.   He would like to proceed with planning for TAVR. Risks and benefits of the valve procedure are reviewed with the patient. Will plan a gated cardiac CT, CTA of the chest/abdomen and pelvis, carotid artery dopplers, PT assessment and he will then be referred to see one of the CT surgeons on our TAVR team.      Current medicines are reviewed at length with the patient today.  The patient does not have concerns regarding medicines.  The following changes have been made:  no change  Labs/ tests ordered today include:  No orders of the defined types were placed in this encounter.    Disposition:   F/U with the valve team.    Signed, Lauree Chandler, MD 07/28/2019 4:08 PM    [facility] [address], [place], [place]  [address] Phone: [phone]; Fax: [phone]

## 2019-07-28 NOTE — Patient Instructions (Signed)
Medication Instructions:  No changes *If you need a refill on your cardiac medications before your next appointment, please call your pharmacy*   Lab Work: None today If you have labs (blood work) drawn today and your tests are completely normal, you will receive your results only by: Marland Kitchen MyChart Message (if you have MyChart) OR . A paper copy in the mail If you have any lab test that is abnormal or we need to change your treatment, we will call you to review the results.   Testing/Procedures: As planned   Follow-Up: As planned Other Instructions

## 2019-07-29 ENCOUNTER — Ambulatory Visit (HOSPITAL_COMMUNITY)
Admission: RE | Admit: 2019-07-29 | Discharge: 2019-07-29 | Disposition: A | Discharge status: Home / Self Care | Payer: Medicare Other | Source: Ambulatory Visit | Attending: Cardiovascular Disease | Admitting: Cardiovascular Disease

## 2019-07-29 ENCOUNTER — Other Ambulatory Visit: Payer: Self-pay

## 2019-07-29 ENCOUNTER — Ambulatory Visit (HOSPITAL_BASED_OUTPATIENT_CLINIC_OR_DEPARTMENT_OTHER)
Admission: RE | Admit: 2019-07-29 | Discharge: 2019-07-29 | Disposition: A | Discharge status: Home / Self Care | Payer: Medicare Other | Source: Ambulatory Visit | Attending: Cardiovascular Disease | Admitting: Cardiovascular Disease

## 2019-07-29 DIAGNOSIS — I350 Nonrheumatic aortic (valve) stenosis: Secondary | ICD-10-CM | POA: Diagnosis not present

## 2019-07-29 DIAGNOSIS — Z01818 Encounter for other preprocedural examination: Secondary | ICD-10-CM | POA: Diagnosis not present

## 2019-07-29 MED ORDER — IOHEXOL 350 MG/ML SOLN
100.0000 mL | Freq: Once | INTRAVENOUS | Status: AC | PRN
  Administered 2019-07-29: 100 mL via INTRAVENOUS

## 2019-07-29 NOTE — Progress Notes (Signed)
Carotid duplex has been completed.   Preliminary results in CV Proc.   Abram Sander 07/29/2019 1:22 PM

## 2019-08-02 ENCOUNTER — Encounter: Payer: Self-pay | Admitting: Nurse Practitioner

## 2019-08-02 ENCOUNTER — Telehealth: Payer: Self-pay | Admitting: Nurse Practitioner

## 2019-08-02 ENCOUNTER — Other Ambulatory Visit: Payer: Self-pay

## 2019-08-02 ENCOUNTER — Inpatient Hospital Stay (HOSPITAL_BASED_OUTPATIENT_CLINIC_OR_DEPARTMENT_OTHER): Payer: Medicare Other | Admitting: Nurse Practitioner

## 2019-08-02 ENCOUNTER — Inpatient Hospital Stay: Payer: Medicare Other | Attending: Oncology

## 2019-08-02 VITALS — BP 153/78 | HR 62 | Temp 97.7°F | Resp 18 | Ht 64.0 in | Wt 169.6 lb

## 2019-08-02 DIAGNOSIS — D696 Thrombocytopenia, unspecified: Secondary | ICD-10-CM

## 2019-08-02 DIAGNOSIS — I209 Angina pectoris, unspecified: Secondary | ICD-10-CM

## 2019-08-02 DIAGNOSIS — D693 Immune thrombocytopenic purpura: Secondary | ICD-10-CM

## 2019-08-02 DIAGNOSIS — C9140 Hairy cell leukemia not having achieved remission: Secondary | ICD-10-CM | POA: Diagnosis not present

## 2019-08-02 DIAGNOSIS — R5381 Other malaise: Secondary | ICD-10-CM

## 2019-08-02 LAB — CMP (CANCER CENTER ONLY)
ALT: 22 U/L (ref 0–44)
AST: 17 U/L (ref 15–41)
Albumin: 3.2 g/dL — ABNORMAL LOW (ref 3.5–5.0)
Alkaline Phosphatase: 69 U/L (ref 38–126)
Anion gap: 10 (ref 5–15)
BUN: 19 mg/dL (ref 8–23)
CO2: 24 mmol/L (ref 22–32)
Calcium: 8.7 mg/dL — ABNORMAL LOW (ref 8.9–10.3)
Chloride: 104 mmol/L (ref 98–111)
Creatinine: 1.09 mg/dL (ref 0.61–1.24)
GFR, Est AFR Am: >60 mL/min (ref >60)
GFR, Estimated: 58 mL/min — ABNORMAL LOW (ref >60)
Glucose, Bld: 100 mg/dL — ABNORMAL HIGH (ref 70–99)
Potassium: 4.2 mmol/L (ref 3.5–5.1)
Sodium: 138 mmol/L (ref 135–145)
Total Bilirubin: 0.7 mg/dL (ref 0.3–1.2)
Total Protein: 5.5 g/dL — ABNORMAL LOW (ref 6.5–8.1)

## 2019-08-02 LAB — CBC WITH DIFFERENTIAL/PLATELET
Abs Immature Granulocytes: 1.32 10*3/uL — ABNORMAL HIGH (ref 0.00–0.07)
Basophils Absolute: 0.1 10*3/uL (ref 0.0–0.1)
Basophils Relative: 1 %
Eosinophils Absolute: 0.1 10*3/uL (ref 0.0–0.5)
Eosinophils Relative: 1 %
HCT: 36 % — ABNORMAL LOW (ref 39.0–52.0)
Hemoglobin: 11.6 g/dL — ABNORMAL LOW (ref 13.0–17.0)
Immature Granulocytes: 11 %
Lymphocytes Relative: 9 %
Lymphs Abs: 1.1 10*3/uL (ref 0.7–4.0)
MCH: 26.6 pg (ref 26.0–34.0)
MCHC: 32.2 g/dL (ref 30.0–36.0)
MCV: 82.6 fL (ref 80.0–100.0)
Monocytes Absolute: 2.1 10*3/uL — ABNORMAL HIGH (ref 0.1–1.0)
Monocytes Relative: 18 %
Neutro Abs: 7.4 10*3/uL (ref 1.7–7.7)
Neutrophils Relative %: 60 %
Platelets: 202 10*3/uL (ref 150–400)
RBC: 4.36 MIL/uL (ref 4.22–5.81)
RDW: 15.9 % — ABNORMAL HIGH (ref 11.5–15.5)
WBC: 12 10*3/uL — ABNORMAL HIGH (ref 4.0–10.5)
nRBC: 0.2 % (ref 0.0–0.2)

## 2019-08-02 NOTE — Telephone Encounter (Signed)
Scheduled appt per 6/1 los - gave patient AVS and calender  ° °

## 2019-08-02 NOTE — Progress Notes (Addendum)
[name] OFFICE PROGRESS NOTE   Diagnosis: Thrombocytopenia  INTERVAL HISTORY:   [name] returns as scheduled.  Prednisone was tapered to 20 mg daily beginning 07/25/2019.  He denies leg swelling.  No difficulty with sleep.  He has a good appetite.  Continue dyspnea on exertion.  He notes bruising on both arms related to IV sites.  Since undergoing the catheterization procedure he has intermittently noted and abnormal sensation at the left chest.  He does not characterize this as pain or discomfort.  Objective:  Vital signs in last 24 hours:  Blood pressure (!) 153/78, pulse 62, temperature 97.7 F (36.5 C), temperature source Temporal, resp. rate 18, height _0  (1.626 m), weight 169 lb 9.6 oz (76.9 kg), SpO2 97 %.    HEENT: Mild white coating over tongue.  No buccal thrush. Resp: Lungs clear bilaterally.  Cardio: Regular rate and rhythm.  2/6 systolic murmur. GI: No hepatosplenomegaly. Vascular: Trace edema at the lower legs bilaterally. Skin: Ecchymoses at both forearms.   Lab Results:  Lab Results  Component Value Date   WBC 12.0 (H) 08/02/2019   HGB 11.6 (L) 08/02/2019   HCT 36.0 (L) 08/02/2019   MCV 82.6 08/02/2019   PLT 202 08/02/2019   NEUTROABS PENDING 08/02/2019    Imaging:  No results found.  Medications: I have reviewed the patient's current medications.  Assessment/Plan: 1. Thrombocytopenia ? Bone marrow biopsy 07/08/2016-no evidence of B-cell lymphoma, 40% cellular bone marrow with trilineage hematopoiesis, megakaryocytes present with normal morphology, normal cytogenetics, negative for BRAF mutation ? Flow cytometry 01/05/2009-no monoclonal B-cell or phenotypically abnormal T-cell population ? Prednisone starting 06/29/2019 ? Nplate 07/04/2019, 2/95/6213, Nplate 07/18/2019 ? Prednisone 20 mg daily beginning 07/25/2019 2. Hairy cell leukemia 1982, status post splenectomy 3. Coronary artery disease 4. Aortic stenosis 5. Liposarcoma at  the right chest wall resected in 2013 6. Macular degeneration 7. Hearing loss 8. Basal cell carcinoma left cheek 05/24/2019 9. Left upper lobe nodule, faint FDG activity on PET at [facility] 05/31/2013  Disposition: [name] appears unchanged.  The platelet count remains in normal range.  He will continue prednisone 20 mg daily.  Nplate will remain on hold with the plan to resume if needed.  He will return for a follow-up CBC on 08/11/2019.  He will return for lab and follow-up on 08/25/2019.  He will contact the office in the interim with any problems.  Patient seen with Dr. Benay Spice.    [name] ANP/GNP-BC   08/02/2019  9:20 AM [name] underwent cardiac catheterization last week.  The platelets remain adequate.  He will continue prednisone at the current dose until he undergoes the TAVR procedure.  We will then plan a prednisone taper.  Julieanne Manson, MD

## 2019-08-04 ENCOUNTER — Institutional Professional Consult (permissible substitution) (INDEPENDENT_AMBULATORY_CARE_PROVIDER_SITE_OTHER): Payer: Medicare Other | Admitting: Surgery

## 2019-08-04 ENCOUNTER — Encounter: Payer: Self-pay | Admitting: Physician Assistant

## 2019-08-04 ENCOUNTER — Other Ambulatory Visit: Payer: Self-pay

## 2019-08-04 VITALS — BP 134/68 | HR 63 | Temp 97.9°F | Resp 20 | Ht 64.0 in | Wt 169.0 lb

## 2019-08-04 DIAGNOSIS — I350 Nonrheumatic aortic (valve) stenosis: Secondary | ICD-10-CM

## 2019-08-04 DIAGNOSIS — I209 Angina pectoris, unspecified: Secondary | ICD-10-CM

## 2019-08-04 NOTE — Progress Notes (Signed)
Patient ID: [name], male   DOB: 01-24-1924, 86 y.o.   MRN: [id number]  [facility] CONSULTATION REPORT  Referring Provider is Belva Crome, MD Primary Cardiologist is Sinclair Grooms, MD PCP is [name], MD  Chief Complaint  Patient presents with  . Aortic Stenosis    Surgical eval for TAVR, review all studies/testing    HPI:  The patient is a 86 year old gentleman with history of hypertension, dyslipidemia, coronary artery disease status post DES x2 to the mid to distal RCA in January 2016, remote hairy cell leukemia, remote colon cancer, idiopathic thrombocytopenia currently managed by Dr. Julieanne Manson, anemia, and aortic stenosis who was referred for consideration of TAVR.  He has been followed by Dr. Tamala Julian with what is felt to be a moderate aortic stenosis.  An echocardiogram in October 2020 showed a mean gradient of 32 mmHg with a peak gradient of 55 mmHg and a valve area of 0.85 cm.  Left ventricular ejection fraction was 60 to 65%.  He now presents with progressive exertional fatigue and shortness of breath which he says has been going on for several years but recently getting worse to the point where he gets fatigue and shortness of breath with going up stairs or an incline.  He has had generalized weakness in his legs with any exercise.  He denies any peripheral edema.  He has had no dizziness or syncope.  He has not had any chest pain.  He had a repeat echocardiogram on 06/13/2019 which showed further progression of his aortic stenosis with a mean gradient of 43 mmHg and a peak gradient of 67 mmHg.  Aortic valve area by VTI was 0.76 cm with a dimensionless index of 0.24 consistent with severe aortic stenosis.  Left ventricular ejection fraction remained 60 to 65% with grade 1 diastolic dysfunction.  The patient is here today with his wife.  Despite being 86 years old he has tried to remain  very active and would like to continue being active.  Past Medical History:  Diagnosis Date  . Acute appendicitis   . Anemia   . Anxiety   . Arthritis    "back" (03/14/2014)  . CAD (coronary artery disease)    a. 03/14/14  s/p overlapping DES x2 to mid-distal RCA.  . 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 Colon cancer ([place]) 1984  . Compression fracture of lumbar spine, non-traumatic ([employer])   . DJD (degenerative joint disease) of lumbar spine   . Dyslipidemia   . Elevated PSA   . GERD (gastroesophageal reflux disease)   . Glaucoma   . Hairy cell leukemia ([place]) dx'd 1980  . History of blood transfusion    "several; related to hairy cell leukemia & tx "  . History of stomach ulcers 1968  . Hypertension   . Malnutrition ([place])   . Osteoarthritis   . Osteoporosis   . Severe aortic stenosis   . Skin cancer of face     Past Surgical History:  Procedure Laterality Date  . APPENDECTOMY  10/2007  . CATARACT EXTRACTION, BILATERAL Bilateral 02/2008  . COLON SURGERY  1984   "sigmoid; open"  . CORONARY ANGIOPLASTY WITH STENT PLACEMENT  03/14/2014   "2"  . FRACTURE SURGERY    . LEFT HEART CATHETERIZATION WITH CORONARY ANGIOGRAM N/A 03/14/2014   Procedure: LEFT HEART CATHETERIZATION WITH CORONARY ANGIOGRAM;  Surgeon: Peter M Martinique, MD;  Location: [facility]  CATH LAB;  Service: Cardiovascular;  Laterality: N/A;  . LIPOMA EXCISION Right 07/2011   liposarcoma resection; "back"  . MOHS SURGERY Left 02/2011  . MOHS SURGERY  X 3   "all on my face"  . MOLE REMOVAL Left 1985   cheek  . ORIF ANKLE FRACTURE Left 2000  . RIGHT/LEFT HEART CATH AND CORONARY ANGIOGRAPHY N/A 07/26/2019   Procedure: RIGHT/LEFT HEART CATH AND CORONARY ANGIOGRAPHY;  Surgeon: Belva Crome, MD;  Location: [facility] CV LAB;  Service: Cardiovascular;  Laterality: N/A;  . SHOULDER SURGERY  04/2004  . SPLENECTOMY  1992  . TONSILLECTOMY AND ADENOIDECTOMY  1939    Family History  Problem Relation Age of Onset    . Congestive Heart Failure Father 84  . Stroke Mother   . Diabetes Mellitus II Brother   . Prostate cancer Brother   . Heart Problems Brother        CABG    Social History   Socioeconomic History  . Marital status: Married    Spouse name: Not on file  . Number of children: Not on file  . Years of education: Not on file  . Highest education level: Not on file  Occupational History  . Not on file  Tobacco Use  . Smoking status: Former Smoker    Packs/day: 0.25    Years: 1.00    Pack years: 0.25    Types: Cigarettes, Cigars  . Smokeless tobacco: Never Used  . Tobacco comment: occasional social smoker during college.  Substance and Sexual Activity  . Alcohol use: Yes    Alcohol/week: 9.0 standard drinks    Types: 2 Glasses of wine, 2 Shots of liquor, 5 Standard drinks or equivalent per week  . Drug use: No  . Sexual activity: Never  Other Topics Concern  . Not on file  Social History Narrative  . Not on file   Social Determinants of Health   Financial Resource Strain:   . Difficulty of Paying Living Expenses:   Food Insecurity:   . Worried About Charity fundraiser in the Last Year:   . Arboriculturist in the Last Year:   Transportation Needs:   . Film/video editor (Medical):   Marland Kitchen Lack of Transportation (Non-Medical):   Physical Activity:   . Days of Exercise per Week:   . Minutes of Exercise per Session:   Stress:   . Feeling of Stress :   Social Connections:   . Frequency of Communication with Friends and Family:   . Frequency of Social Gatherings with Friends and Family:   . Attends Religious Services:   . Active Member of Clubs or Organizations:   . Attends Archivist Meetings:   Marland Kitchen Marital Status:   Intimate Partner Violence:   . Fear of Current or Ex-Partner:   . Emotionally Abused:   Marland Kitchen Physically Abused:   . Sexually Abused:     Current Outpatient Medications  Medication Sig Dispense Refill  . atorvastatin (LIPITOR) 20 MG tablet  TAKE 1 TABLET BY MOUTH DAILY AT 6 PM (Patient taking differently: Take 20 mg by mouth daily at 6 PM. ) 90 tablet 3  . Cholecalciferol (VITAMIN D-3 PO) Take 2,000 Units by mouth at bedtime.     Marland Kitchen desonide (DESOWEN) 0.05 % cream Apply 1 application topically 2 (two) times daily as needed (irritation).     . fluocinonide (LIDEX) 0.05 % external solution Apply 1 application topically 2 (two) times daily as needed (  apply to ears).   11  . latanoprost (XALATAN) 0.005 % ophthalmic solution Place 1 drop into both eyes at bedtime.     . metoprolol tartrate (LOPRESSOR) 25 MG tablet TAKE ONE-HALF TABLET BY MOUTH DAILY PLEASE KEEP YOUR UPCOMING APPOINTMENT WITH DOCTOR SMITH IN [place] FOR FUTURE REFILLS. THANK YOU (Patient taking differently: Take 12.5 mg by mouth daily. ) 45 tablet 5  . Multiple Vitamins-Minerals (PRESERVISION AREDS 2 PO) Take 1 capsule by mouth 2 (two) times daily.     . nitroGLYCERIN (NITROSTAT) 0.4 MG SL tablet Place 1 tablet (0.4 mg total) under the tongue every 5 (five) minutes as needed for chest pain. Please make yearly appt with Dr. Tamala Julian for September. 1st attempt 25 tablet 2  . pantoprazole (PROTONIX) 40 MG tablet Take 40 mg by mouth daily.    . predniSONE (DELTASONE) 10 MG tablet Take 4 tablets (40 mg total) by mouth daily with breakfast. (Patient taking differently: Take 30 mg by mouth daily with breakfast. ) 112 tablet 0  . ranibizumab (LUCENTIS) 0.5 MG/0.05ML SOLN 0.5 mg by Intravitreal route every 28 (twenty-eight) days.     Marland Kitchen sertraline (ZOLOFT) 50 MG tablet Take 50 mg by mouth every evening.     . tamsulosin (FLOMAX) 0.4 MG CAPS capsule Take 0.8 mg by mouth daily after supper.     . timolol (BETIMOL) 0.5 % ophthalmic solution Place 1 drop into the right eye daily.     Marland Kitchen triamcinolone cream (KENALOG) 0.1 % Apply 1 application topically 2 (two) times daily as needed (irritation).     No current facility-administered medications for this visit.    Allergies  Allergen  Reactions  . Antazoline Anaphylaxis  . Antihistamines, Chlorpheniramine-Type     Nervous, jittery  . Sulfa Antibiotics Rash      Review of Systems:   General:  normal appetite, + decreased energy, no weight gain, no weight loss, no fever  Cardiac:  no chest pain with exertion, no chest pain at rest, +SOB with  exertion, no resting SOB, no PND, no orthopnea, no palpitations, no arrhythmia, no atrial fibrillation, no LE edema, no dizzy spells, no syncope  Respiratory:  + exertional shortness of breath, no home oxygen, no productive cough, no dry cough, no bronchitis, no wheezing, no hemoptysis, no asthma, no pain with inspiration or cough, no sleep apnea, no CPAP at night  GI:   no difficulty swallowing, no reflux, no frequent heartburn, no hiatal hernia, no abdominal pain, no constipation, no diarrhea, no hematochezia, no hematemesis, no melena  GU:   no dysuria,  + frequency, no urinary tract infection, no hematuria, + enlarged prostate, no kidney stones, no kidney disease  Vascular:  no pain suggestive of claudication, no pain in feet, no leg cramps, no varicose veins, no DVT, no non-healing foot ulcer  Neuro:   no stroke, no TIA's, no seizures, no headaches, no temporary blindness one eye,  no slurred speech, no peripheral neuropathy, no chronic pain, no instability of gait, no memory/cognitive dysfunction  Musculoskeletal: + arthritis, no joint swelling, no myalgias, no difficulty walking, normal mobility   Skin:   + rash, no itching, no skin infections, no pressure sores or ulcerations  Psych:   no anxiety, no depression, no nervousness, no unusual recent stress  Eyes:   no blurry vision, no floaters, no recent vision changes, + wears glasses or contacts  ENT:   + hearing loss, no loose or painful teeth, no dentures, last saw dentist within past 30  days.  Hematologic:  + easy bruising, no abnormal bleeding, no clotting disorder, no frequent epistaxis  Endocrine:  no diabetes, does not  check CBG's at home     Physical Exam:   BP 134/68   Pulse 63   Temp 97.9 F (36.6 C) (Skin)   Resp 20   Ht 5\' 4"  (1.626 m)   Wt 169 lb (76.7 kg)   SpO2 95% Comment: RA  BMI 29.01 kg/m   General:  Elderly but  well-appearing  HEENT:  Unremarkable, NCAT, PERLA, EOMI  Neck:   no JVD, no bruits, no adenopathy   Chest:   clear to auscultation, symmetrical breath sounds, no wheezes, no rhonchi   CV:   RRR, grade lll/VI crescendo/decrescendo murmur heard best at RSB,  no diastolic murmur  Abdomen:  soft, non-tender, no masses   Extremities:  warm, well-perfused, pulses palpable at ankle, mild bilateral LE edema  Rectal/GU  Deferred  Neuro:   Grossly non-focal and symmetrical throughout  Skin:   Clean and dry, no rashes, no breakdown   Diagnostic Tests:  ECHOCARDIOGRAM REPORT       Patient Name:  [name] Date of Exam: 06/13/2019  Medical Rec #: [id number]   Height:    64.0 in  Accession #:  [id number]  Weight:    164.0 lb  Date of Birth: 1923-05-18  BSA:     1.798 m  Patient Age:  42 years   BP:      118/66 mmHg  Patient Gender: M       HR:      56 bpm.  Exam Location: [address]   Procedure: 2D Echo, 3D Echo, Cardiac Doppler, Color Doppler and Strain  Analysis   Indications:  I35.9 Aortic valve disease    History:    Patient has prior history of Echocardiogram examinations,  most         recent 12/27/2018. CAD, Aortic Valve Disease,         Signs/Symptoms:Dyspnea; Risk Factors:Hypertension,  Dyslipidemia         and Former Smoker. Colon cancer.    Sonographer:  Jessee Avers, RDCS  Referring Phys: [place]    1. Left ventricular ejection fraction, by estimation, is 60 to 65%. The  left ventricle has normal function. The left ventricle has no regional  wall motion abnormalities. Left ventricular diastolic parameters are  consistent with Grade I  diastolic  dysfunction (impaired relaxation). Elevated left atrial pressure.  2. Right ventricular systolic function is normal. The right ventricular  size is normal. There is mildly elevated pulmonary artery systolic  pressure.  3. Left atrial size was mildly dilated.  4. The mitral valve is abnormal. Mild mitral valve regurgitation.  5. AV is thickened, calcified with restricted motion. Peak and mean  gradients through the valve are 67 and 43 mm Hg respectively. Thisk in  increased from echo report of 2020. LVOT/AV VTI ratio is 024 consistent  with severe AS.Marland Kitchen The aortic valve is  tricuspid. Aortic valve regurgitation is moderate.  6. Aortic dilatation noted. There is mild dilatation of the ascending  aorta measuring 39 mm.   Comparison(s): 12/27/18 EF 60-65%. Moderate AS 13mmHg mean PG.   FINDINGS  Left Ventricle: Left ventricular ejection fraction, by estimation, is 60  to 65%. The left ventricle has normal function. The left ventricle has no  regional wall motion abnormalities. The left ventricular internal cavity  size was normal in size. There  is  no left ventricular hypertrophy. Left ventricular diastolic parameters  are consistent with Grade I diastolic dysfunction (impaired relaxation).  Elevated left atrial pressure.   Right Ventricle: The right ventricular size is normal. No increase in  right ventricular wall thickness. Right ventricular systolic function is  normal. There is mildly elevated pulmonary artery systolic pressure. The  tricuspid regurgitant velocity is 2.74  m/s, and with an assumed right atrial pressure of 8 mmHg, the estimated  right ventricular systolic pressure is 99991111 mmHg.   Left Atrium: Left atrial size was mildly dilated.   Right Atrium: Right atrial size was normal in size.   Pericardium: Trivial pericardial effusion is present.   Mitral Valve: The mitral valve is abnormal. There is mild thickening of  the mitral valve leaflet(s).  Mild mitral valve regurgitation.   Tricuspid Valve: The tricuspid valve is normal in structure. Tricuspid  valve regurgitation is mild.   Aortic Valve: AV is thickened, calcified with restricted motion. Peak and  mean gradients through the valve are 67 and 43 mm Hg respectively. This id  increased from echo report of 2020. LVOT/AV VTI ratio is 0.24 consistent  with severe AS. The aortic valve  is tricuspid. Aortic valve regurgitation is moderate. Aortic  regurgitation PHT measures 509 msec. Aortic valve area, by VTI measures  0.76 cm.   Pulmonic Valve: The pulmonic valve was normal in structure. Pulmonic valve  regurgitation is trivial.   Aorta: Aortic dilatation noted. There is mild dilatation of the ascending  aorta measuring 39 mm.   IAS/Shunts: No atrial level shunt detected by color flow Doppler.     LEFT VENTRICLE  PLAX 2D  LVIDd:     4.80 cm Diastology  LVIDs:     3.25 cm LV e' lateral:  6.18 cm/s  LV PW:     0.90 cm LV E/e' lateral: 21.5  LV IVS:    0.80 cm LV e' medial:  2.68 cm/s  LVOT diam:   2.00 cm LV E/e' medial: 49.6  LV SV:     95  LV SV Index:  53    2D Longitudinal Strain  LVOT Area:   3.14 cm 2D Strain GLS (A2C):  -17.9 %             2D Strain GLS (A3C):  -19.9 %             2D Strain GLS (A4C):  -24.7 %             2D Strain GLS Avg:   -20.9 %               3D Volume EF:             3D EF:    56 %             LV EDV:    129 ml             LV ESV:    57 ml             LV SV:    72 ml   RIGHT VENTRICLE  RV Basal diam: 5.10 cm  RV Mid diam:  3.40 cm  RV S prime:   11.10 cm/s  TAPSE (M-mode): 2.7 cm  RVSP:      38.0 mmHg   LEFT ATRIUM       Index    RIGHT ATRIUM      Index  LA diam:    4.20 cm  2.34 cm/m RA Pressure: 8.00 mmHg  LA Vol (A2C):  82.1 ml 45.66  ml/m RA Area:   13.20 cm  LA Vol (A4C):  51.6 ml 28.70 ml/m RA Volume:  32.70 ml 18.19 ml/m  LA Biplane Vol: 66.7 ml 37.09 ml/m  AORTIC VALVE  AV Area (Vmax):  0.73 cm  AV Area (Vmean):  0.76 cm  AV Area (VTI):   0.76 cm  AV Vmax:      410.00 cm/s  AV Vmean:     313.500 cm/s  AV VTI:      1.255 m  AV Peak Grad:   67.2 mmHg  AV Mean Grad:   43.0 mmHg  LVOT Vmax:     95.80 cm/s  LVOT Vmean:    76.000 cm/s  LVOT VTI:     0.302 m  LVOT/AV VTI ratio: 0.24  AI PHT:      509 msec    AORTA  Ao Root diam: 3.80 cm  Ao Asc diam: 3.90 cm   MITRAL VALVE        TRICUSPID VALVE               TR Peak grad:  30.0 mmHg               TR Vmax:    274.00 cm/s  MV E velocity: 133.00 cm/s Estimated RAP: 8.00 mmHg  MV A velocity: 131.00 cm/s RVSP:      38.0 mmHg  MV E/A ratio: 1.02               SHUNTS               Systemic VTI: 0.30 m               Systemic Diam: 2.00 cm   Dorris Carnes MD  Electronically signed by Dorris Carnes MD  Signature Date/Time: 06/13/2019/6:15:56 PM     Physicians Panel Physicians Referring Physician Case Authorizing Physician  Belva Crome, MD (Primary)       Procedures RIGHT/LEFT HEART CATH AND CORONARY ANGIOGRAPHY     Conclusion Previously documented severe calcific aortic stenosis by echocardiography. There is peak to peak gradient of 48 mmHg in the Cath Lab with a calculated aortic valve area of 1.56 cm which is discongruent with the patient's symptoms and echo results.  Total occlusion mid to distal LAD, chronic. Fills by right to left and left to left collaterals. Third diagonal branch contains 90% stenosis. First diagonal contains ostial proximal 60 to 70% narrowing.  Left main has ectasia but no significant obstruction. Ostium is 30% narrowed.  Circumflex is very small without any meaningful obtuse  marginal branches.  Mid RCA above previously stented distal segment contains 50 to 60% narrowing.  Normal right heart pressures. RECOMMENDATIONS:  Referred to structural heart team, Dr. Angelena Form for further evaluation.  Major clinical issue is idiopathic thrombocytopenia which has responded nicely to 170,000 by use of prednisone and Nplate.     Recommendations Antiplatelet/Anticoag Recommend Aspirin 81mg  daily for moderate CAD.        Indications Aortic stenosis, severe [I35.0 (ICD-10-CM)]  Coronary artery disease involving native coronary artery of native heart without angina pectoris [I25.10 (ICD-10-CM)]     Procedural Details Technical Details The right radial area was sterilely prepped and draped. Intravenous sedation with Versed and fentanyl was administered. 1% Xylocaine was infiltrated to achieve local analgesia. Using real-time vascular ultrasound, a double wall stick with an angiocath was utilized to obtain intra-arterial access. A  VUS image was saved for the permanent record.The modified Seldinger technique was used to place a 37F " Slender" sheath in the right radial artery. Weight based heparin was administered. Coronary angiography was done using 5 F catheters. Right coronary angiography was performed with a JR4. Left ventricular hemodymic recordings and angiography was done using the JR 4 catheter and hand injection. Left coronary angiography was performed with a JL 3.5 cm.  Right heart catheterization was performed by exchanging a previously placed antecubital IV angio-cath for a 5 French Slender sheath. 1% Xylocaine was used to locally nesthetize the area around the IV site. The IV catheter was wired using an .018 guidewire. The modified Seldinger technique was used to place the 5 [place] sheath. Double glove technique was used to enhance sterility. After sheath insertion, right heart cath was performed using a 5 French balloon tipped catheter and fluoroscopic guidance. Pressures  were recorded in each chamber and in the pulmonary capillary wedge position.. The main pulmonary artery O2 saturation was sampled.   Hemostasis was achieved using a pneumatic band.  During this procedure the patient is administered a total of Versed 1 mg and Fentanyl 25 mcg to achieve and maintain moderate conscious sedation. The patient's heart rate, blood pressure, and oxygen saturation are monitored continuously during the procedure. The period of conscious sedation is 28 minutes, of which I was present face-to-face 100% of this time. Estimated blood loss <50 mL.   During this procedure medications were administered to achieve and maintain moderate conscious sedation while the patient's heart rate, blood pressure, and oxygen saturation were continuously monitored and I was present face-to-face 100% of this time.     Medications (Filter: Administrations occurring from 07/26/19 1115 to 07/26/19 1214)  Continuous medications are totaled by the amount administered until 07/26/19 1214.  Heparin (Porcine) in NaCl 1000-0.9 UT/500ML-% SOLN (mL) Total volume: 500 mL  Date/Time   Rate/Dose/Volume Action  07/26/19 1118  500 mL Given  Heparin (Porcine) in NaCl 1000-0.9 UT/500ML-% SOLN (mL) Total volume: 500 mL  Date/Time   Rate/Dose/Volume Action  07/26/19 1118  500 mL Given  fentaNYL (SUBLIMAZE) injection (mcg) Total dose: 25 mcg  Date/Time   Rate/Dose/Volume Action  07/26/19 1135  25 mcg Given  midazolam (VERSED) injection (mg) Total dose: 1 mg  Date/Time   Rate/Dose/Volume Action  07/26/19 1137  0.5 mg Given  1141  0.5 mg Given  lidocaine (PF) (XYLOCAINE) 1 % injection (mL) Total volume: 3 mL  Date/Time   Rate/Dose/Volume Action  07/26/19 1140  1 mL Given  1142  2 mL Given  Radial Cocktail/Verapamil only (mL) Total volume: 10 mL  Date/Time   Rate/Dose/Volume Action  07/26/19 1143  10 mL Given  heparin sodium (porcine) injection (Units) Total dose: 3,500 Units  Date/Time    Rate/Dose/Volume Action  07/26/19 1152  3,500 Units Given  iohexol (OMNIPAQUE) 350 MG/ML injection (mL) Total volume: 60 mL  Date/Time   Rate/Dose/Volume Action  07/26/19 1207  60 mL Given     Sedation Time Sedation Time Physician-1: 28 minutes 21 seconds        Contrast Medication Name Total Dose  iohexol (OMNIPAQUE) 350 MG/ML injection 60 mL     Radiation/Fluoro Fluoro time: 6.2 (min)  DAP: 13822 (mGycm2)  Cumulative Air Kerma: 255 (mGy)        Coronary Findings Diagnostic Dominance: Right  Left Anterior Descending  Collaterals  Dist LAD filled by collaterals from RPDA.    Mid LAD to Dist LAD lesion 100%  stenosed  Mid LAD to Dist LAD lesion is 100% stenosed.   First Diagonal Branch  1st Diag lesion 65% stenosed  1st Diag lesion is 65% stenosed.   First Septal Branch  Vessel is small in size.   Third Diagonal Branch  3rd Diag lesion 70% stenosed  3rd Diag lesion is 70% stenosed.   Left Circumflex  Vessel is small.   First Obtuse Marginal Branch  Vessel is small in size.   Second Obtuse Marginal Branch  Vessel is small in size.   Third Obtuse Marginal Branch  Vessel is small in size.   Right Coronary Artery  Prox RCA to Mid RCA lesion 60% stenosed  Prox RCA to Mid RCA lesion is 60% stenosed.  Mid RCA to Dist RCA lesion 10% stenosed  Mid RCA to Dist RCA lesion is 10% stenosed. The lesion was previously treated.   Right Posterior Descending Artery  RPDA lesion 40% stenosed  RPDA lesion is 40% stenosed.  Intervention No interventions have been documented.          Right Heart Right Heart Pressures Hemodynamic findings consistent with pulmonary hypertension. LV EDP is normal.   Right Atrium The right atrial size is normal.                 Left Heart Left Ventricle LV end diastolic pressure is normal.   Aortic Valve There is severe aortic valve stenosis. The aortic valve is calcified.     Coronary  Diagrams Diagnostic Dominance: Right  &&&&&  Intervention      Implants  No implant documentation for this case.      [employer] Images Link to Procedure Log  Show images for CARDIAC CATHETERIZATION Procedure Log     Images on Long Term Storage   Show images for Alfread, Srinivas Data   Most Recent Value  Fick Cardiac Output 5.48 L/min  Fick Cardiac Output Index 3.02 (L/min)/BSA  Aortic Mean Gradient 32.24 mmHg  Aortic Peak Gradient 48 mmHg  Aortic Valve Area 1.56  Aortic Value Area Index 0.86 cm2/BSA  RA A Wave 9 mmHg  RA V Wave 8 mmHg  RA Mean 6 mmHg  RV Systolic Pressure 33 mmHg  RV Diastolic Pressure 4 mmHg  RV EDP 8 mmHg  PA Systolic Pressure 32 mmHg  PA Diastolic Pressure 10 mmHg  PA Mean 19 mmHg  PW A Wave 16 mmHg  PW V Wave 14 mmHg  PW Mean 11 mmHg  AO Systolic Pressure [id number] mmHg  AO Diastolic Pressure 53 mmHg  AO Mean 78 mmHg  LV Systolic Pressure [id number] mmHg  LV Diastolic Pressure 5 mmHg  LV EDP 17 mmHg  AOp Systolic Pressure [id number] mmHg  AOp Diastolic Pressure 51 mmHg  AOp Mean Pressure 75 mmHg  LVp Systolic Pressure [id number] mmHg  LVp Diastolic Pressure 8 mmHg  LVp EDP Pressure 17 mmHg  QP/QS 1  TPVR Index 6.29 HRUI  TSVR Index 27.48 HRUI  PVR SVR Ratio 0.1  TPVR/TSVR Ratio 0.23    Carotid Arterial Duplex Study   Indications:    Pre tavr.  Risk Factors:   Hypertension, coronary artery disease.  Comparison Study: no prior   Performing Technologist: Abram Sander RVS     Examination Guidelines: A complete evaluation includes B-mode imaging,  spectral  Doppler, color Doppler, and power Doppler as needed of all accessible  portions  of each vessel. Bilateral testing is considered an integral part of  a  complete  examination. Limited examinations for reoccurring indications may be  performed  as noted.     Right Carotid Findings:  +----------+--------+--------+--------+------------------+--------+       PSV cm/sEDV  cm/sStenosisPlaque DescriptionComments  +----------+--------+--------+--------+------------------+--------+  CCA Prox 73   9        heterogenous         +----------+--------+--------+--------+------------------+--------+  CCA Distal60   14       heterogenous         +----------+--------+--------+--------+------------------+--------+  ICA Prox 62   15   1-39%  heterogenous         +----------+--------+--------+--------+------------------+--------+  ICA Distal68   19                      +----------+--------+--------+--------+------------------+--------+  ECA    90                          +----------+--------+--------+--------+------------------+--------+   +----------+--------+-------+--------+-------------------+       PSV cm/sEDV cmsDescribeArm Pressure (mmHG)  +----------+--------+-------+--------+-------------------+  Subclavian108                       +----------+--------+-------+--------+-------------------+   +---------+--------+--+--------+-+---------+  VertebralPSV cm/s38EDV cm/s9Antegrade  +---------+--------+--+--------+-+---------+      Left Carotid Findings:  +----------+--------+--------+--------+------------------+--------+       PSV cm/sEDV cm/sStenosisPlaque DescriptionComments  +----------+--------+--------+--------+------------------+--------+  CCA Prox 109   11       heterogenous         +----------+--------+--------+--------+------------------+--------+  CCA Distal59   5        heterogenous         +----------+--------+--------+--------+------------------+--------+  ICA Prox 57   11   1-39%  heterogenous         +----------+--------+--------+--------+------------------+--------+  ICA Distal69    19                      +----------+--------+--------+--------+------------------+--------+  ECA    81                          +----------+--------+--------+--------+------------------+--------+   +----------+--------+--------+--------+-------------------+       PSV cm/sEDV cm/sDescribeArm Pressure (mmHG)  +----------+--------+--------+--------+-------------------+  [id number]                       +----------+--------+--------+--------+-------------------+   +---------+--------+--+--------+-+---------+  VertebralPSV cm/s23EDV cm/s7Antegrade  +---------+--------+--+--------+-+---------+         Summary:  Right Carotid: Velocities in the right ICA are consistent with a 1-39%  stenosis.   Left Carotid: Velocities in the left ICA are consistent with a 1-39%  stenosis.   Vertebrals: Bilateral vertebral arteries demonstrate antegrade flow.   *See table(s) above for measurements and observations.      Electronically signed by Harold Barban MD on 07/31/2019 at 1:14:58 PM.      ADDENDUM REPORT: 08/02/2019 13:52  EXAM: OVER-READ INTERPRETATION  CT CHEST  The following report is an over-read performed by radiologist Dr. Samara Snide [facility] [place] on 08/02/2019. This over-read does not include interpretation of cardiac or coronary anatomy or pathology. The coronary CTA interpretation by the cardiologist is attached.  COMPARISON:  03/29/2013 chest CT.  FINDINGS: Please see the separate concurrent chest CT angiogram report for details.  IMPRESSION: Please see the separate concurrent chest CT angiogram report for details.   Electronically Signed   By: Ilona Sorrel M.D.   On: 08/02/2019 13:52   Addended by Salvatore Marvel  Sheppard Coil, MD on 08/02/2019 1:55 PM    Study Result  CLINICAL DATA:  Aortic Stenosis  EXAM: Cardiac TAVR  CT  TECHNIQUE: The patient was scanned on a Siemens Force [id number] slice scanner. A 120 kV retrospective scan was triggered in the ascending thoracic aorta at 140 HU's. Gantry rotation speed was 250 msecs and collimation was .6 mm. No beta blockade or nitro were given. The 3D data set was reconstructed in 5% intervals of the R-R cycle. Systolic and diastolic phases were analyzed on a dedicated work station using MPR, MIP and VRT modes. The patient received 80 cc of contrast.  FINDINGS: Aortic Valve: Tri leaflet Calcified with restricted leaflet motion  Aorta: No aneurysm Normal arch vessels mild atherosclerosis  Sino-tubular Junction: 28 mm  Ascending Thoracic Aorta: 36 mm  Aortic Arch: 34 mm  Descending Thoracic Aorta: 26 mm  Sinus of Valsalva Measurements:  Non-coronary: 35.1 mm  Right - coronary: 34.2 mm  Left -   coronary: 36.3 mm  Coronary Artery Height above Annulus:  Left Main: 15.9 mm above annulus  Right Coronary: 20 mm above annulus  Virtual Basal Annulus Measurements:  Maximum / Minimum Diameter: 27.2 mm x 22.5 mm  Perimeter: 80 mm  Area: 484 mm 2  Coronary Arteries: Sufficient height above annulus for deployment  Optimum Fluoroscopic Angle for Delivery: LAO 15 Caudal 17 degrees  IMPRESSION: 1. Tri leaflet AV with annular area of 484 mm 2 suitable for a 26 mm Sapien 3 valve  2. Optimum angiographic angle for deployment LAO 15 Caudal 17 degrees  3.  Coronary arteries suitable height above annulus for deployment  4.  Normal aortic root 3.6 cm  Jenkins Rouge  Electronically Signed: By: Jenkins Rouge M.D. On: 07/29/2019 12:18      CLINICAL DATA:  Severe symptomatic aortic stenosis. Pre-TAVR evaluation.  EXAM: CT ANGIOGRAPHY CHEST, ABDOMEN AND PELVIS  TECHNIQUE: Multidetector CT imaging through the chest, abdomen and pelvis was performed using the standard protocol during bolus administration of intravenous  contrast. Multiplanar reconstructed images and MIPs were obtained and reviewed to evaluate the vascular anatomy.  CONTRAST:  122mL OMNIPAQUE IOHEXOL 350 MG/ML SOLN  COMPARISON:  03/29/2013 chest CT.  8259 CT abdomen/pelvis.  FINDINGS: CTA CHEST FINDINGS  Cardiovascular: Mild cardiomegaly. Mild lipomatous hypertrophy of the interatrial septum. Diffusely thickened and severely calcified aortic valve. No significant pericardial effusion/thickening. Left anterior descending and right coronary atherosclerosis. Atherosclerotic nonaneurysmal thoracic aorta. Top-normal caliber main pulmonary artery (3.0 cm diameter). No central pulmonary emboli.  Mediastinum/Nodes: Several scattered small hypodense bilateral thyroid nodules, largest 1.0 cm on the right. Not clinically significant; no follow-up imaging recommended (ref: J Am Coll Radiol. 2015 Feb;12(2): 143-50). Unremarkable esophagus. No pathologically enlarged axillary, mediastinal or hilar lymph nodes.  Lungs/Pleura: No pneumothorax. No pleural effusion. Circumscribed 3.6 x 3.0 cm solid central left upper lobe lung mass (series 8/image 47), increased from 2.6 x 2.5 cm on 03/29/2013 CT. No acute consolidative airspace disease or additional significant pulmonary nodules.  Musculoskeletal: No aggressive appearing focal osseous lesions. Heart thoracic spondylosis. Moderate gynecomastia, asymmetric to the right, similar.  CTA ABDOMEN AND PELVIS FINDINGS  Hepatobiliary: Normal liver with no liver mass. Normal gallbladder with no radiopaque cholelithiasis. No biliary ductal dilatation.  Pancreas: Normal, with no mass or duct dilation.  Spleen: Splenectomy.  Adrenals/Urinary Tract: Normal adrenals. No hydronephrosis. Simple 2.1 cm interpolar right renal cyst. No additional contour deforming renal lesions. Stable chronic diffuse bladder wall thickening. Bladder not significantly distended.  Stomach/Bowel: Normal  non-distended stomach. Normal caliber small bowel with no small bowel wall thickening. Appendectomy. Minimal sigmoid diverticulosis with no large bowel wall thickening or significant pericolonic fat stranding.  Vascular/Lymphatic: Atherosclerotic abdominal aorta with ectatic 2.5 cm infrarenal abdominal aorta. No pathologically enlarged lymph nodes in the abdomen or pelvis.  Reproductive: Moderate prostatomegaly.  Other: No pneumoperitoneum, ascites or focal fluid collection.  Musculoskeletal: No aggressive appearing focal osseous lesions. Marked lumbar spondylosis.  VASCULAR MEASUREMENTS PERTINENT TO TAVR:  AORTA:  Minimal Aortic Diameter-16.2 x 14.9 mm  Severity of Aortic Calcification-moderate to severe  RIGHT PELVIS:  Right Common Iliac Artery -  Minimal Diameter-10.3 x 9.6 mm  Tortuosity-mild  Calcification-moderate  Right External Iliac Artery -  Minimal Diameter-8.8 x 8.5 mm  Tortuosity-moderate  Calcification-moderate  Right Common Femoral Artery -  Minimal Diameter-9.2 x 6.3 mm  Tortuosity-mild  Calcification-moderate to severe  LEFT PELVIS:  Left Common Iliac Artery -  Minimal Diameter-9.7 x 8.8 mm  Tortuosity-mild  Calcification-moderate  Left External Iliac Artery -  Minimal Diameter-7.4 x 6.1 mm  Tortuosity-moderate  Calcification-moderate  Left Common Femoral Artery -  Minimal Diameter-8.4 x 7.8 mm  Tortuosity-mild  Calcification-moderate  Review of the MIP images confirms the above findings.  IMPRESSION: 1. Vascular findings and measurements pertinent to potential TAVR procedure, as detailed. 2. Severe thickening and calcification of the aortic valve, compatible with the reported history of severe symptomatic aortic stenosis. 3. Mild cardiomegaly.  Two-vessel coronary atherosclerosis. 4. Circumscribed 3.6 cm solid central left upper lung mass, increased from 2.6 cm on 2015 chest CT  study. Relatively slow rate of growth is suggestive of a benign neoplasm such as pulmonary hamartoma, although a low-grade malignancy cannot be excluded. PET-CT may be obtained for further characterization as clinically warranted. 5. Moderate prostatomegaly. 6. Minimal sigmoid diverticulosis. 7.  Aortic Atherosclerosis (ICD10-I70.0).   Electronically Signed   By: Ilona Sorrel M.D.   On: 07/31/2019 04:50   Impression:  This 86 year old gentleman has stage D, severe, symptomatic aortic stenosis with New York Heart Association class II symptoms of exertional fatigue and shortness of breath consistent with chronic diastolic congestive heart failure.  He feels like his symptoms are significantly limiting his activity level.  I have personally reviewed his 2D echocardiogram, cardiac catheterization, and CTA studies.  His echocardiogram shows a thickened and calcified aortic valve with restricted mobility.  The mean gradient is 43 mmHg with a dimensionless index of 0.24 and a valve area of 0.76 cm consistent with severe aortic stenosis.  There is moderate aortic insufficiency with a pressure half-time of 509 ms.  Left ventricular systolic function is normal.  Cardiac catheterization showed a peak to peak gradient of 48 mmHg.  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  There is 60 to 70% first diagonal stenosis.  A third diagonal branch has 90% stenosis.  The mid RCA above a previously stented distal segment contains about 50 to 60% narrowing.  The left circumflex is small without any significant disease.  I agree that aortic valve replacement is indicated in this 86 year old fairly active gentleman with lifestyle limiting severe aortic stenosis.  I think transcatheter aortic valve replacement would be the best option for him.  His gated cardiac CTA shows anatomy suitable for transcatheter aortic valve  replacement using a SAPIEN 3 valve.  Abdominal and pelvic CTA shows adequate pelvic vascular anatomy to allow transfemoral insertion.  Is CTA of  the chest also shows a 3.6 x 3.0 cm solid mass in the left upper lobe of the lung which is smoothly marginated and has a benign appearance.  Review of his prior CT scan from 03/29/2013 showed that it measured 2.6 x 2.5 cm at that time.  Given the appearance and slow increase in size over the past 6 years I think this is certainly a benign lesion such as a hamartoma.  He said that this has been followed for many years at [facility] and he was told that nothing need to be done about it.  I would agree with that.  The patient and his wife were counseled at length regarding treatment alternatives for management of severe symptomatic aortic stenosis. The risks and benefits of surgical intervention has been discussed in detail. Long-term prognosis with medical therapy was discussed. Alternative approaches such as conventional surgical aortic valve replacement, transcatheter aortic valve replacement, and palliative medical therapy were compared and contrasted at length. This discussion was placed in the context of the patient's own specific clinical presentation and past medical history. All of their questions have been addressed.   Following the decision to proceed with transcatheter aortic valve replacement, a discussion was held regarding what types of management strategies would be attempted intraoperatively in the event of life-threatening complications, including whether or not the patient would be considered a candidate for the use of cardiopulmonary bypass and/or conversion to open sternotomy for attempted surgical intervention. The patient is aware of the fact that transient use of cardiopulmonary bypass may be necessary.  Given his age and 36 years I do not think he would be a candidate for emergent sternotomy to manage any intraoperative complications.  The patient has  been advised of a variety of complications that might develop including but not limited to risks of death, stroke, paravalvular leak, aortic dissection or other major vascular complications, aortic annulus rupture, device embolization, cardiac rupture or perforation, mitral regurgitation, acute myocardial infarction, arrhythmia, heart block or bradycardia requiring permanent pacemaker placement, congestive heart failure, respiratory failure, renal failure, pneumonia, infection, other late complications related to structural valve deterioration or migration, or other complications that might ultimately cause a temporary or permanent loss of functional independence or other long term morbidity. The patient provides full informed consent for the procedure as described and all questions were answered.    Plan:  He will be scheduled for transfemoral transcatheter aortic valve replacement on Tuesday, 08/16/2019.  I spent 80 minutes performing this consultation and > 50% of this time was spent face to face counseling and coordinating the care of this patient's severe symptomatic aortic stenosis.  Gaye Pollack, MD 08/04/2019 5:30 PM

## 2019-08-05 ENCOUNTER — Other Ambulatory Visit: Payer: Self-pay | Admitting: Physician Assistant

## 2019-08-05 ENCOUNTER — Encounter: Payer: Self-pay | Admitting: Physician Assistant

## 2019-08-05 DIAGNOSIS — I350 Nonrheumatic aortic (valve) stenosis: Secondary | ICD-10-CM

## 2019-08-09 ENCOUNTER — Encounter: Payer: Self-pay | Admitting: Physical Therapy

## 2019-08-09 ENCOUNTER — Ambulatory Visit: Payer: Medicare Other | Attending: Cardiovascular Disease | Admitting: Physical Therapy

## 2019-08-09 ENCOUNTER — Other Ambulatory Visit: Payer: Self-pay | Admitting: Interventional Cardiology

## 2019-08-09 ENCOUNTER — Other Ambulatory Visit: Payer: Self-pay

## 2019-08-09 DIAGNOSIS — R262 Difficulty in walking, not elsewhere classified: Secondary | ICD-10-CM | POA: Diagnosis not present

## 2019-08-09 NOTE — Therapy (Signed)
[place], [place], [address] Phone: [phone]   Fax:  [phone]  Physical Therapy Evaluation/Pre-TAVR  Patient Details  Name: [name] MRN: [id number] Date of Birth: May 22, 1923 Referring Provider (PT): Lauree Chandler MD   Encounter Date: 08/09/2019  PT End of Session - 08/09/19 1448    Visit Number  1    PT Start Time  6644    PT Stop Time  1446    PT Time Calculation (min)  31 min    Activity Tolerance  Patient tolerated treatment well    Behavior During Therapy  [facility] for tasks assessed/performed       Past Medical History:  Diagnosis Date  . Acute appendicitis   . Anemia   . Anxiety   . Arthritis    "back" (03/14/2014)  . CAD (coronary artery disease)    a. 03/14/14  s/p overlapping DES x2 to mid-distal RCA.  . 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 Colon cancer ([place]) 1984  . Compression fracture of lumbar spine, non-traumatic ([place])   . DJD (degenerative joint disease) of lumbar spine   . Dyslipidemia   . Elevated PSA   . GERD (gastroesophageal reflux disease)   . Glaucoma   . Hairy cell leukemia ([place]) dx'd 1980  . History of blood transfusion    "several; related to hairy cell leukemia & tx "  . History of stomach ulcers 1968  . Hypertension   . Malnutrition ([place])   . Osteoarthritis   . Osteoporosis   . Severe aortic stenosis   . Skin cancer of face     Past Surgical History:  Procedure Laterality Date  . APPENDECTOMY  10/2007  . CATARACT EXTRACTION, BILATERAL Bilateral 02/2008  . COLON SURGERY  1984   "sigmoid; open"  . CORONARY ANGIOPLASTY WITH STENT PLACEMENT  03/14/2014   "2"  . FRACTURE SURGERY    . LEFT HEART CATHETERIZATION WITH CORONARY ANGIOGRAM N/A 03/14/2014   Procedure: LEFT HEART CATHETERIZATION WITH CORONARY ANGIOGRAM;  Surgeon: Peter M Martinique, MD;  Location: [facility] CATH LAB;  Service: Cardiovascular;  Laterality: N/A;  . LIPOMA EXCISION  Right 07/2011   liposarcoma resection; "back"  . MOHS SURGERY Left 02/2011  . MOHS SURGERY  X 3   "all on my face"  . MOLE REMOVAL Left 1985   cheek  . ORIF ANKLE FRACTURE Left 2000  . RIGHT/LEFT HEART CATH AND CORONARY ANGIOGRAPHY N/A 07/26/2019   Procedure: RIGHT/LEFT HEART CATH AND CORONARY ANGIOGRAPHY;  Surgeon: Belva Crome, MD;  Location: [place] CV LAB;  Service: Cardiovascular;  Laterality: N/A;  . SHOULDER SURGERY  04/2004  . SPLENECTOMY  1992  . TONSILLECTOMY AND ADENOIDECTOMY  1939    There were no vitals filed for this visit.   Subjective Assessment - 08/09/19 1417    Subjective  Symptoms have been coming on for several years. I always try to care for myself and exercise and I get winded. Going up the steps will get me at home. Walking [place] and [place] of [place] about 150 feet, I have to stop on my way back to catch my breath.    Currently in Pain?  No/denies         [facility] PT Assessment - 08/09/19 0001      Assessment   Medical Diagnosis  severe aortic stenosis    Referring Provider (PT)  Lauree Chandler MD    Onset Date/Surgical Date  --  several years   Hand Dominance  Right      Precautions   Precautions  None      Restrictions   Weight Bearing Restrictions  No      Balance Screen   Has the patient fallen in the past 6 months  No      [place] residence    Additional Comments  stairs at home      Prior Function   Level of [place]  Retired      Associate Professor   Overall Cognitive Status  Within Functional Limits for tasks assessed      Observation/Other Assessments   Focus on Therapeutic Outcomes (FOTO)   n/a TAVR      Sensation   Additional Comments  WFL      Posture/Postural Control   Posture Comments  forward head      ROM / Strength   AROM / PROM / Strength  AROM;Strength      AROM   Overall AROM Comments  WFL      Strength   Overall Strength Comments   gross 5/5    Strength Assessment Site  Hand    Right/Left hand  Right;Left    Right Hand Grip (lbs)  30    Left Hand Grip (lbs)  50       [facility] Pre-Surgical Assessment - 08/09/19 0001    5 Meter Walk Test- trial 1  4 sec    5 Meter Walk Test- trial 2  3 sec.     5 Meter Walk Test- trial 3  3 sec.    5 meter walk test average  3.33 sec    4 Stage Balance Test Position  4    Sit To Stand Test- trial 1  17 sec.    6 Minute Walk- Baseline  yes    BP (mmHg)  141/59    HR (bpm)  51    02 Sat (%RA)  96 %    Modified Borg Scale for Dyspnea  0- Nothing at all    Perceived Rate of Exertion (Borg)  6-    6 Minute Walk Post Test  yes    BP (mmHg)  152/73    HR (bpm)  65    02 Sat (%RA)  94 %    Modified Borg Scale for Dyspnea  4- somewhat severe    Perceived Rate of Exertion (Borg)  12-    Aerobic Endurance Distance Walked  740    Endurance additional comments  normative values not available for this age group                Objective measurements completed on examination: See above findings.                           Plan - 08/09/19 1449    PT Frequency  One time visit    Consulted and Agree with Plan of Care  Patient      Clinical Impression Statement: Pt is a 86 yo M presenting to OP PT for evaluation prior to possible TAVR surgery due to severe aortic stenosis. Pt reports onset of SOB about 15 years ago. Symptoms are  limiting functional endurance. Pt presents with WFL ROM and strength and denies musculoskeletal pain other than minimal LBP first thing in the morning.  Pt ambulated 370 feet in 2.5 min before requesting  a standing rest beak lasting 2 min. At time of rest, patient's HR was 74 bpm and O2 was 96% on room air. Pt reported 5/10 shortness of breath on modified scale for dyspnea. Pt able to resume after rest and ambulate an additional 370 feet. Pt ambulated a total of 740 feet in 6 minute walk and reported 4/10 SOB on modified scale for dyspena  and 12/20 RPE on Borg's perceived exertion and pain scale at the end of the walk. During the 6 minute walk test, patient's HR increased to 74 BPM and O2 saturation decreased to 95%. Based on the Short Physical Performance Battery, patient has a frailty rating of 9/12 with </= 5/12 considered frail.  Visit Diagnosis: Difficulty in walking, not elsewhere classified     Problem List Patient Active Problem List   Diagnosis Date Noted  . Severe aortic stenosis   . Aortic stenosis, severe   . Chronic ITP (idiopathic thrombocytopenia) (HCC) 07/04/2019  . Chronic eczematous otitis externa of both ears 01/15/2016  . Sensorineural hearing loss (SNHL) of both ears 01/15/2016  . Prostate nodule 03/09/2015  . Constipation 06/22/2014  . Malaise and fatigue 06/22/2014  . CAD (coronary artery disease)   . GERD (gastroesophageal reflux disease)   . Hypertension   . Dyslipidemia   . Colon cancer ([place])   . Angina pectoris ([place]) 03/14/2014  . Abnormal nuclear stress test 03/10/2014  . Aortic valve disease 08/10/2013  . Solitary pulmonary nodule 02/11/2013  . Leukemic reticuloendotheliosis, extranodal and solid organ sites [facility]) 01/03/2013  . Benign hypertensive heart disease 01/03/2013  . Dyspnea and respiratory abnormality 01/03/2013  . Glaucoma 11/27/2011  . Personal history of other malignant neoplasm of skin 10/14/2011  . Cancer of colon ([place]) 09/12/2011  . Elevated PSA 09/12/2011  . 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 Traction detachment of retina 11/18/2010  . Exudative age-related macular degeneration ([place]) 04/16/2010  . Epiretinal membrane 04/16/2010  . Vitreous degeneration 04/16/2010  . INFECTION, STAPHYLOCOCCUS AUREUS 11/30/2006  . Staphylococcus aureus infection 11/30/2006    Illene Sweeting C. Zionah Criswell PT, DPT 08/09/19 2:57 PM   [place] [facility] [address] [place], [place], [address] Phone: [phone]   Fax:   [phone]  Name: [name] MRN: [id number] Date of Birth: 1923/08/26

## 2019-08-11 ENCOUNTER — Telehealth: Payer: Self-pay

## 2019-08-11 ENCOUNTER — Inpatient Hospital Stay: Payer: Medicare Other

## 2019-08-11 ENCOUNTER — Other Ambulatory Visit: Payer: Self-pay

## 2019-08-11 ENCOUNTER — Encounter: Payer: Medicare Other | Admitting: Thoracic Surgery (Cardiothoracic Vascular Surgery)

## 2019-08-11 VITALS — BP 146/71 | HR 58 | Temp 98.3°F | Resp 16

## 2019-08-11 DIAGNOSIS — D696 Thrombocytopenia, unspecified: Secondary | ICD-10-CM | POA: Diagnosis not present

## 2019-08-11 DIAGNOSIS — D693 Immune thrombocytopenic purpura: Secondary | ICD-10-CM

## 2019-08-11 LAB — CBC WITH DIFFERENTIAL (CANCER CENTER ONLY)
Abs Immature Granulocytes: 0.84 10*3/uL — ABNORMAL HIGH (ref 0.00–0.07)
Basophils Absolute: 0.1 10*3/uL (ref 0.0–0.1)
Basophils Relative: 1 %
Eosinophils Absolute: 0.1 10*3/uL (ref 0.0–0.5)
Eosinophils Relative: 1 %
HCT: 35.9 % — ABNORMAL LOW (ref 39.0–52.0)
Hemoglobin: 11.7 g/dL — ABNORMAL LOW (ref 13.0–17.0)
Immature Granulocytes: 8 %
Lymphocytes Relative: 9 %
Lymphs Abs: 0.9 10*3/uL (ref 0.7–4.0)
MCH: 26.8 pg (ref 26.0–34.0)
MCHC: 32.6 g/dL (ref 30.0–36.0)
MCV: 82.2 fL (ref 80.0–100.0)
Monocytes Absolute: 2.2 10*3/uL — ABNORMAL HIGH (ref 0.1–1.0)
Monocytes Relative: 22 %
Neutro Abs: 5.8 10*3/uL (ref 1.7–7.7)
Neutrophils Relative %: 59 %
Platelet Count: 26 10*3/uL — ABNORMAL LOW (ref 150–400)
RBC: 4.37 MIL/uL (ref 4.22–5.81)
RDW: 16.2 % — ABNORMAL HIGH (ref 11.5–15.5)
WBC Count: 10 10*3/uL (ref 4.0–10.5)
nRBC: 0 % (ref 0.0–0.2)

## 2019-08-11 MED ORDER — ROMIPLOSTIM 250 MCG ~~LOC~~ SOLR
155.0000 ug | Freq: Once | SUBCUTANEOUS | Status: AC
  Administered 2019-08-11: 155 ug via SUBCUTANEOUS
  Filled 2019-08-11: qty 0.31

## 2019-08-11 NOTE — Progress Notes (Signed)
08/11/19  Maintain dose of NPlate at 2 mcg/kg today due to last dose 3 weeks ago.    Dose 2 mcg/kg = 155 mcg  T.O. Dr Darletta Moll, PharmD

## 2019-08-11 NOTE — Telephone Encounter (Signed)
TC to pt per [name] NP. I let him know that he needed to come back in today to receive an NPLATE injection due to low Platlets. Also let him know that he would be scheduled for a LAB and INJ appointment again next week. Patient verbalized understanding and stated that he could be back today at 1:00pm to get NPLATE injection. schedule message sent.

## 2019-08-11 NOTE — Patient Instructions (Signed)
Romiplostim injection What is this medicine? ROMIPLOSTIM (roe mi PLOE stim) helps your body make more platelets. This medicine is used to treat low platelets caused by chronic idiopathic thrombocytopenic purpura (ITP). This medicine may be used for other purposes; ask your health care provider or pharmacist if you have questions. COMMON BRAND NAME(S): Nplate What should I tell my health care provider before I take this medicine? They need to know if you have any of these conditions:  bleeding disorders  bone marrow problem, like blood cancer or myelodysplastic syndrome  history of blood clots  liver disease  surgery to remove your spleen  an unusual or allergic reaction to romiplostim, mannitol, other medicines, foods, dyes, or preservatives  pregnant or trying to get pregnant  breast-feeding How should I use this medicine? This medicine is for injection under the skin. It is given by a health care professional in a hospital or clinic setting. A special MedGuide will be given to you before your injection. Read this information carefully each time. Talk to your pediatrician regarding the use of this medicine in children. While this drug may be prescribed for children as young as 1 year for selected conditions, precautions do apply. Overdosage: If you think you have taken too much of this medicine contact a poison control center or emergency room at once. NOTE: This medicine is only for you. Do not share this medicine with others. What if I miss a dose? It is important not to miss your dose. Call your doctor or health care professional if you are unable to keep an appointment. What may interact with this medicine? Interactions are not expected. This list may not describe all possible interactions. Give your health care provider a list of all the medicines, herbs, non-prescription drugs, or dietary supplements you use. Also tell them if you smoke, drink alcohol, or use illegal drugs.  Some items may interact with your medicine. What should I watch for while using this medicine? Your condition will be monitored carefully while you are receiving this medicine. Visit your prescriber or health care professional for regular checks on your progress and for the needed blood tests. It is important to keep all appointments. What side effects may I notice from receiving this medicine? Side effects that you should report to your doctor or health care professional as soon as possible:  allergic reactions like skin rash, itching or hives, swelling of the face, lips, or tongue  signs and symptoms of bleeding such as bloody or black, tarry stools; red or dark brown urine; spitting up blood or brown material that looks like coffee grounds; red spots on the skin; unusual bruising or bleeding from the eyes, gums, or nose  signs and symptoms of a blood clot such as chest pain; shortness of breath; pain, swelling, or warmth in the leg  signs and symptoms of a stroke like changes in vision; confusion; trouble speaking or understanding; severe headaches; sudden numbness or weakness of the face, arm or leg; trouble walking; dizziness; loss of balance or coordination Side effects that usually do not require medical attention (report to your doctor or health care professional if they continue or are bothersome):  headache  pain in arms and legs  pain in mouth  stomach pain This list may not describe all possible side effects. Call your doctor for medical advice about side effects. You may report side effects to FDA at 1-800-FDA-1088. Where should I keep my medicine? This drug is given in a hospital or clinic   and will not be stored at home. NOTE: This sheet is a summary. It may not cover all possible information. If you have questions about this medicine, talk to your doctor, pharmacist, or health care provider.  2020 Elsevier/Gold Standard (2017-02-16 11:10:55)  

## 2019-08-12 ENCOUNTER — Encounter (HOSPITAL_COMMUNITY): Payer: Self-pay

## 2019-08-12 ENCOUNTER — Other Ambulatory Visit: Payer: Self-pay | Admitting: Physician Assistant

## 2019-08-12 ENCOUNTER — Other Ambulatory Visit (HOSPITAL_COMMUNITY)
Admission: RE | Admit: 2019-08-12 | Discharge: 2019-08-12 | Disposition: A | Discharge status: Home / Self Care | Payer: Medicare Other | Source: Ambulatory Visit | Attending: Cardiovascular Disease | Admitting: Cardiovascular Disease

## 2019-08-12 ENCOUNTER — Telehealth: Payer: Self-pay

## 2019-08-12 ENCOUNTER — Ambulatory Visit (HOSPITAL_COMMUNITY)
Admission: RE | Admit: 2019-08-12 | Discharge: 2019-08-12 | Disposition: A | Discharge status: Home / Self Care | Payer: Medicare Other | Source: Ambulatory Visit | Attending: Cardiovascular Disease | Admitting: Cardiovascular Disease

## 2019-08-12 ENCOUNTER — Encounter (HOSPITAL_COMMUNITY)
Admission: RE | Admit: 2019-08-12 | Discharge: 2019-08-12 | Disposition: A | Discharge status: Home / Self Care | Payer: Medicare Other | Source: Ambulatory Visit | Attending: Cardiovascular Disease | Admitting: Cardiovascular Disease

## 2019-08-12 ENCOUNTER — Other Ambulatory Visit: Payer: Self-pay

## 2019-08-12 DIAGNOSIS — D696 Thrombocytopenia, unspecified: Secondary | ICD-10-CM

## 2019-08-12 DIAGNOSIS — Z20822 Contact with and (suspected) exposure to covid-19: Secondary | ICD-10-CM | POA: Diagnosis not present

## 2019-08-12 DIAGNOSIS — Z01818 Encounter for other preprocedural examination: Secondary | ICD-10-CM | POA: Diagnosis not present

## 2019-08-12 DIAGNOSIS — Z01811 Encounter for preprocedural respiratory examination: Secondary | ICD-10-CM

## 2019-08-12 LAB — BLOOD GAS, ARTERIAL
Acid-base deficit: 0.1 mmol/L (ref 0.0–2.0)
Bicarbonate: 23.7 mmol/L (ref 20.0–28.0)
Drawn by: 58793
FIO2: 21
O2 Saturation: 98.6 %
Patient temperature: 37
pCO2 arterial: 37 mmHg (ref 32.0–48.0)
pH, Arterial: 7.423 (ref 7.350–7.450)
pO2, Arterial: 115 mmHg — ABNORMAL HIGH (ref 83.0–108.0)

## 2019-08-12 LAB — COMPREHENSIVE METABOLIC PANEL
ALT: 28 U/L (ref 0–44)
AST: 20 U/L (ref 15–41)
Albumin: 3.4 g/dL — ABNORMAL LOW (ref 3.5–5.0)
Alkaline Phosphatase: 62 U/L (ref 38–126)
Anion gap: 9 (ref 5–15)
BUN: 15 mg/dL (ref 8–23)
CO2: 22 mmol/L (ref 22–32)
Calcium: 8.8 mg/dL — ABNORMAL LOW (ref 8.9–10.3)
Chloride: 104 mmol/L (ref 98–111)
Creatinine, Ser: 0.98 mg/dL (ref 0.61–1.24)
GFR calc Af Amer: >60 mL/min (ref >60)
GFR calc non Af Amer: >60 mL/min (ref >60)
Glucose, Bld: 137 mg/dL — ABNORMAL HIGH (ref 70–99)
Potassium: 4.4 mmol/L (ref 3.5–5.1)
Sodium: 135 mmol/L (ref 135–145)
Total Bilirubin: 0.9 mg/dL (ref 0.3–1.2)
Total Protein: 5.8 g/dL — ABNORMAL LOW (ref 6.5–8.1)

## 2019-08-12 LAB — CBC
HCT: 39.1 % (ref 39.0–52.0)
Hemoglobin: 12.3 g/dL — ABNORMAL LOW (ref 13.0–17.0)
MCH: 26 pg (ref 26.0–34.0)
MCHC: 31.5 g/dL (ref 30.0–36.0)
MCV: 82.7 fL (ref 80.0–100.0)
Platelets: 21 10*3/uL — CL (ref 150–400)
RBC: 4.73 MIL/uL (ref 4.22–5.81)
RDW: 16.4 % — ABNORMAL HIGH (ref 11.5–15.5)
WBC: 11.3 10*3/uL — ABNORMAL HIGH (ref 4.0–10.5)
nRBC: 0 % (ref 0.0–0.2)

## 2019-08-12 LAB — URINALYSIS, ROUTINE W REFLEX MICROSCOPIC
Bilirubin Urine: NEGATIVE
Glucose, UA: NEGATIVE mg/dL
Hgb urine dipstick: NEGATIVE
Ketones, ur: NEGATIVE mg/dL
Leukocytes,Ua: NEGATIVE
Nitrite: NEGATIVE
Protein, ur: NEGATIVE mg/dL
Specific Gravity, Urine: 1.014 (ref 1.005–1.030)
pH: 7 (ref 5.0–8.0)

## 2019-08-12 LAB — TYPE AND SCREEN
ABO/RH(D): O POS
Antibody Screen: NEGATIVE

## 2019-08-12 LAB — SARS CORONAVIRUS 2 (TAT 6-24 HRS): SARS Coronavirus 2: NEGATIVE

## 2019-08-12 LAB — SURGICAL PCR SCREEN
MRSA, PCR: POSITIVE — AB
Staphylococcus aureus: POSITIVE — AB

## 2019-08-12 LAB — PROTIME-INR
INR: 1 (ref 0.8–1.2)
Prothrombin Time: 12.7 seconds (ref 11.4–15.2)

## 2019-08-12 LAB — HEMOGLOBIN A1C
Hgb A1c MFr Bld: 6.4 % — ABNORMAL HIGH (ref 4.8–5.6)
Mean Plasma Glucose: 136.98 mg/dL

## 2019-08-12 LAB — ABO/RH: ABO/RH(D): O POS

## 2019-08-12 LAB — APTT: aPTT: 26 seconds (ref 24–36)

## 2019-08-12 LAB — BRAIN NATRIURETIC PEPTIDE: B Natriuretic Peptide: 321.1 pg/mL — ABNORMAL HIGH (ref 0.0–100.0)

## 2019-08-12 NOTE — Telephone Encounter (Signed)
Please see documentation attached to CXR result.  This area has been followed by [facility] for over 15 years.  The pt gets yearly CXRs performed.

## 2019-08-12 NOTE — Progress Notes (Signed)
Critical lab value platelets 21,000.  Patient is followed by hematology for ITP and receives periodic Nplate injections.  He did receive Nplate yesterday 6/96 when his platelets were 26,000.  Per telephone encounter he is scheduled to have follow-up labs and repeat injection next week.  I called and spoke with Nell Range, PA-C. She is aware of the lab values and will follow and discuss further with the TAVR team.  Karoline Caldwell, PA-C [facility]/Anesthesiology Phone [phone] 08/12/2019 4:40 PM

## 2019-08-12 NOTE — Progress Notes (Signed)
PCP:  [name], MD Cardiologist:  Daneen Schick, MD  EKG:  08/12/19 CXR:  08/12/19 ECHO:  06/13/19 Stress Test:  Denies Cardiac Cath:  07/26/19  Covid test 08/12/19  Anesthesia Review:  Yes, Cardiac history.   Patient denies shortness of breath, fever, cough, and chest pain at PAT appointment.  Patient verbalized understanding of instructions provided today at the PAT appointment.  Patient asked to review instructions at home and day of surgery.

## 2019-08-12 NOTE — Telephone Encounter (Signed)
Phone call from [facility] with [facility].  Pt's chest xray today shows Pulmonary nodular lesion in the left mid lung measuring 3.8x3.4cm.  Recommend PET-CT.  Will route information to Theodosia Quay, RN and Dr Burt Knack for review and recommendation.  Theodosia Quay RN is already aware of this result.

## 2019-08-12 NOTE — Progress Notes (Signed)
[facility]ORE #[id number], [address] [place] [address] Phone: [phone] Fax: [phone]  [facility] SERVICE) [place], [place] [address] Phone: [phone] Fax: [phone]      Your procedure is scheduled on August 16, 2019.  Report to [facility] Main Entrance "A" at 8:45 A.M., and check in at the Admitting office.  Call this number if you have problems the morning of surgery:  [phone]  Call [phone] if you have any questions prior to your surgery date Monday-Friday 8am-4pm    Remember:  Do not eat or drink after midnight the night before your surgery    Please continue taking all of your current medications through the day before surgery.  On the day of surgery take NO medications.  As of today, STOP taking any Aspirin (unless otherwise instructed by your surgeon) and Aspirin containing products, Aleve, Naproxen, Ibuprofen, Motrin, Advil, Goody's, BC's, all herbal medications, fish oil, and all vitamins.                      Do not wear jewelry            Do not wear lotions, powders, colognes, or deodorant.            Do not shave 48 hours prior to surgery.  Men may shave face and neck.            Do not bring valuables to the hospital.            [facility] is not responsible for any belongings or valuables.  Do NOT Smoke (Tobacco/Vapping) or drink Alcohol 24 hours prior to your procedure If you use a CPAP at night, you may bring all equipment for your overnight stay.   Contacts, glasses, dentures or bridgework may not be worn into surgery.      For patients admitted to the hospital, discharge time will be determined by your treatment team.   Patients discharged the day of surgery will not be allowed to drive home, and someone needs to stay with them for 24  hours.    Special instructions:   [place]- Preparing For Surgery  Before surgery, you can play an important role. Because skin is not sterile, your skin needs to be as free of germs as possible. You can reduce the number of germs on your skin by washing with CHG (chlorahexidine gluconate) Soap before surgery.  CHG is an antiseptic cleaner which kills germs and bonds with the skin to continue killing germs even after washing.    Oral Hygiene is also important to reduce your risk of infection.  Remember - BRUSH YOUR TEETH THE MORNING OF SURGERY WITH YOUR REGULAR TOOTHPASTE  Please do not use if you have an allergy to CHG or antibacterial soaps. If your skin becomes reddened/irritated stop using the CHG.  Do not shave (including legs and underarms) for at least 48 hours prior to first CHG shower. It is OK to shave your face.  Please follow these instructions carefully.   1. Shower the NIGHT BEFORE SURGERY and the MORNING OF SURGERY with CHG Soap.   2. If you chose to wash your hair, wash your hair first as usual with your normal shampoo.  3. After you shampoo, rinse your hair and body thoroughly  to remove the shampoo.  4. Use CHG as you would any other liquid soap. You can apply CHG directly to the skin and wash gently with a scrungie or a clean washcloth.   5. Apply the CHG Soap to your body ONLY FROM THE NECK DOWN.  Do not use on open wounds or open sores. Avoid contact with your eyes, ears, mouth and genitals (private parts). Wash Face and genitals (private parts)  with your normal soap.   6. Wash thoroughly, paying special attention to the area where your surgery will be performed.  7. Thoroughly rinse your body with warm water from the neck down.  8. DO NOT shower/wash with your normal soap after using and rinsing off the CHG Soap.  9. Pat yourself dry with a CLEAN TOWEL.  10. Wear CLEAN PAJAMAS to bed the night before surgery, wear comfortable clothes the morning of  surgery  11. Place CLEAN SHEETS on your bed the night of your first shower and DO NOT SLEEP WITH PETS.   Day of Surgery:   Do not apply any deodorants/lotions.  Please wear clean clothes to the hospital/surgery center.   Remember to brush your teeth WITH YOUR REGULAR TOOTHPASTE.   Please read over the following fact sheets that you were given.

## 2019-08-12 NOTE — Progress Notes (Signed)
Spoke to [name], [place] regarding critical lab value that just resulted.  Platelets at 21.  Will notify surgeon.

## 2019-08-15 ENCOUNTER — Other Ambulatory Visit: Payer: Self-pay

## 2019-08-15 ENCOUNTER — Encounter: Payer: Self-pay | Admitting: Oncology

## 2019-08-15 ENCOUNTER — Other Ambulatory Visit: Payer: Medicare Other | Admitting: *Deleted

## 2019-08-15 DIAGNOSIS — Z20822 Contact with and (suspected) exposure to covid-19: Secondary | ICD-10-CM | POA: Diagnosis not present

## 2019-08-15 DIAGNOSIS — Z01818 Encounter for other preprocedural examination: Secondary | ICD-10-CM | POA: Diagnosis not present

## 2019-08-15 DIAGNOSIS — D696 Thrombocytopenia, unspecified: Secondary | ICD-10-CM | POA: Diagnosis not present

## 2019-08-15 LAB — CBC
Hematocrit: 35.2 % — ABNORMAL LOW (ref 37.5–51.0)
Hemoglobin: 11.9 g/dL — ABNORMAL LOW (ref 13.0–17.7)
MCH: 26.6 pg (ref 26.6–33.0)
MCHC: 33.8 g/dL (ref 31.5–35.7)
MCV: 79 fL (ref 79–97)
Platelets: 11 10*3/uL — CL (ref 150–450)
RBC: 4.48 x10E6/uL (ref 4.14–5.80)
RDW: 16.3 % — ABNORMAL HIGH (ref 11.6–15.4)
WBC: 12 10*3/uL — ABNORMAL HIGH (ref 3.4–10.8)

## 2019-08-15 MED ORDER — SODIUM CHLORIDE 0.9 % IV SOLN
INTRAVENOUS | Status: DC
  Filled 2019-08-15: qty 30

## 2019-08-15 MED ORDER — VANCOMYCIN HCL 1250 MG/250ML IV SOLN
1250.0000 mg | INTRAVENOUS | Status: DC
  Filled 2019-08-15: qty 250

## 2019-08-15 MED ORDER — DEXMEDETOMIDINE HCL IN NACL 400 MCG/100ML IV SOLN
0.1000 ug/kg/h | INTRAVENOUS | Status: DC
  Filled 2019-08-15: qty 100

## 2019-08-15 MED ORDER — NOREPINEPHRINE 4 MG/250ML-% IV SOLN
0.0000 ug/min | INTRAVENOUS | Status: DC
  Filled 2019-08-15: qty 250

## 2019-08-15 MED ORDER — MAGNESIUM SULFATE 50 % IJ SOLN
40.0000 meq | INTRAMUSCULAR | Status: DC
  Filled 2019-08-15: qty 9.85

## 2019-08-15 MED ORDER — POTASSIUM CHLORIDE 2 MEQ/ML IV SOLN
80.0000 meq | INTRAVENOUS | Status: DC
  Filled 2019-08-15: qty 40

## 2019-08-15 MED ORDER — SODIUM CHLORIDE 0.9 % IV SOLN
1.5000 g | INTRAVENOUS | Status: DC
  Filled 2019-08-15: qty 1.5

## 2019-08-15 NOTE — Anesthesia Preprocedure Evaluation (Addendum)
Anesthesia Evaluation  Patient identified by MRN, date of birth, ID band Patient awake    Reviewed: Allergy & Precautions, NPO status , Patient's Chart, lab work & pertinent test results  Airway Mallampati: II  TM Distance: >3 FB Neck ROM: Full    Dental  (+) Dental Advisory Given   Pulmonary shortness of breath, former smoker,    breath sounds clear to auscultation       Cardiovascular hypertension, Pt. on medications and Pt. on home beta blockers + angina + CAD and + Cardiac Stents  + Valvular Problems/Murmurs AS  Rhythm:Regular Rate:Normal     Neuro/Psych negative neurological ROS     GI/Hepatic Neg liver ROS, GERD  ,  Endo/Other  negative endocrine ROS  Renal/GU negative Renal ROS     Musculoskeletal   Abdominal   Peds  Hematology  (+) anemia ,   Anesthesia Other Findings   Reproductive/Obstetrics                            Lab Results  Component Value Date   WBC 13.5 (H) 09/02/2019   HGB 12.4 (L) 09/02/2019   HCT 39.1 09/02/2019   MCV 81.3 09/02/2019   PLT 143 (L) 09/02/2019   Lab Results  Component Value Date   CREATININE 0.99 09/02/2019   BUN 21 09/02/2019   NA 140 09/02/2019   K 4.1 09/02/2019   CL 108 09/02/2019   CO2 23 09/02/2019    Anesthesia Physical Anesthesia Plan  ASA: IV  Anesthesia Plan: MAC   Post-op Pain Management:    Induction:   PONV Risk Score and Plan: 1 and Ondansetron and Treatment may vary due to age or medical condition  Airway Management Planned: Natural Airway and Simple Face Mask  Additional Equipment: Arterial line  Intra-op Plan:   Post-operative Plan:   Informed Consent: I have reviewed the patients History and Physical, chart, labs and discussed the procedure including the risks, benefits and alternatives for the proposed anesthesia with the patient or authorized representative who has indicated his/her understanding and  acceptance.       Plan Discussed with: CRNA  Anesthesia Plan Comments: (See PAT note written 09/02/2019 by Myra Gianotti, PA-C. Recent treatment for ITP, PLT count 143K 09/02/19.  Per cardiology communication: "Plan tempo wire from the IJ, no neck lines".   )      Anesthesia Quick Evaluation

## 2019-08-16 ENCOUNTER — Telehealth: Payer: Self-pay | Admitting: *Deleted

## 2019-08-16 ENCOUNTER — Telehealth: Payer: Self-pay | Admitting: Oncology

## 2019-08-16 DIAGNOSIS — D693 Immune thrombocytopenic purpura: Secondary | ICD-10-CM

## 2019-08-16 NOTE — Telephone Encounter (Signed)
Scheduled apt per 6/15 sch message - pt is aware of appt date and time

## 2019-08-16 NOTE — Telephone Encounter (Addendum)
Left VM for patient to increase Prednisone to 40 mg daily starting now and will schedule him for CBC/Nplate injection for 10/15/92. Requested return call to confirm receipt. High priority scheduling message sent for appointments. Called patient back and he confirmed he got message and took 40 mg prednisone today. Has his appointments for tomorrow as well.

## 2019-08-17 ENCOUNTER — Inpatient Hospital Stay: Payer: Medicare Other

## 2019-08-17 ENCOUNTER — Other Ambulatory Visit: Payer: Self-pay

## 2019-08-17 VITALS — BP 142/66 | HR 60 | Temp 98.6°F | Resp 18

## 2019-08-17 DIAGNOSIS — D696 Thrombocytopenia, unspecified: Secondary | ICD-10-CM | POA: Diagnosis not present

## 2019-08-17 DIAGNOSIS — D693 Immune thrombocytopenic purpura: Secondary | ICD-10-CM

## 2019-08-17 LAB — CBC WITH DIFFERENTIAL (CANCER CENTER ONLY)
Abs Immature Granulocytes: 1.1 10*3/uL — ABNORMAL HIGH (ref 0.00–0.07)
Basophils Absolute: 0.1 10*3/uL (ref 0.0–0.1)
Basophils Relative: 1 %
Eosinophils Absolute: 0 10*3/uL (ref 0.0–0.5)
Eosinophils Relative: 0 %
HCT: 36.2 % — ABNORMAL LOW (ref 39.0–52.0)
Hemoglobin: 11.7 g/dL — ABNORMAL LOW (ref 13.0–17.0)
Immature Granulocytes: 8 %
Lymphocytes Relative: 5 %
Lymphs Abs: 0.6 10*3/uL — ABNORMAL LOW (ref 0.7–4.0)
MCH: 26.4 pg (ref 26.0–34.0)
MCHC: 32.3 g/dL (ref 30.0–36.0)
MCV: 81.7 fL (ref 80.0–100.0)
Monocytes Absolute: 2.5 10*3/uL — ABNORMAL HIGH (ref 0.1–1.0)
Monocytes Relative: 19 %
Neutro Abs: 8.8 10*3/uL — ABNORMAL HIGH (ref 1.7–7.7)
Neutrophils Relative %: 67 %
Platelet Count: 50 10*3/uL — ABNORMAL LOW (ref 150–400)
RBC: 4.43 MIL/uL (ref 4.22–5.81)
RDW: 16.8 % — ABNORMAL HIGH (ref 11.5–15.5)
WBC Count: 13.1 10*3/uL — ABNORMAL HIGH (ref 4.0–10.5)
nRBC: 0.3 % — ABNORMAL HIGH (ref 0.0–0.2)

## 2019-08-17 MED ORDER — ROMIPLOSTIM 250 MCG ~~LOC~~ SOLR
155.0000 ug | Freq: Once | SUBCUTANEOUS | Status: AC
  Administered 2019-08-17: 155 ug via SUBCUTANEOUS
  Filled 2019-08-17: qty 0.31

## 2019-08-17 NOTE — Patient Instructions (Signed)
Romiplostim injection What is this medicine? ROMIPLOSTIM (roe mi PLOE stim) helps your body make more platelets. This medicine is used to treat low platelets caused by chronic idiopathic thrombocytopenic purpura (ITP). This medicine may be used for other purposes; ask your health care provider or pharmacist if you have questions. COMMON BRAND NAME(S): Nplate What should I tell my health care provider before I take this medicine? They need to know if you have any of these conditions:  bleeding disorders  bone marrow problem, like blood cancer or myelodysplastic syndrome  history of blood clots  liver disease  surgery to remove your spleen  an unusual or allergic reaction to romiplostim, mannitol, other medicines, foods, dyes, or preservatives  pregnant or trying to get pregnant  breast-feeding How should I use this medicine? This medicine is for injection under the skin. It is given by a health care professional in a hospital or clinic setting. A special MedGuide will be given to you before your injection. Read this information carefully each time. Talk to your pediatrician regarding the use of this medicine in children. While this drug may be prescribed for children as young as 1 year for selected conditions, precautions do apply. Overdosage: If you think you have taken too much of this medicine contact a poison control center or emergency room at once. NOTE: This medicine is only for you. Do not share this medicine with others. What if I miss a dose? It is important not to miss your dose. Call your doctor or health care professional if you are unable to keep an appointment. What may interact with this medicine? Interactions are not expected. This list may not describe all possible interactions. Give your health care provider a list of all the medicines, herbs, non-prescription drugs, or dietary supplements you use. Also tell them if you smoke, drink alcohol, or use illegal drugs.  Some items may interact with your medicine. What should I watch for while using this medicine? Your condition will be monitored carefully while you are receiving this medicine. Visit your prescriber or health care professional for regular checks on your progress and for the needed blood tests. It is important to keep all appointments. What side effects may I notice from receiving this medicine? Side effects that you should report to your doctor or health care professional as soon as possible:  allergic reactions like skin rash, itching or hives, swelling of the face, lips, or tongue  signs and symptoms of bleeding such as bloody or black, tarry stools; red or dark brown urine; spitting up blood or brown material that looks like coffee grounds; red spots on the skin; unusual bruising or bleeding from the eyes, gums, or nose  signs and symptoms of a blood clot such as chest pain; shortness of breath; pain, swelling, or warmth in the leg  signs and symptoms of a stroke like changes in vision; confusion; trouble speaking or understanding; severe headaches; sudden numbness or weakness of the face, arm or leg; trouble walking; dizziness; loss of balance or coordination Side effects that usually do not require medical attention (report to your doctor or health care professional if they continue or are bothersome):  headache  pain in arms and legs  pain in mouth  stomach pain This list may not describe all possible side effects. Call your doctor for medical advice about side effects. You may report side effects to FDA at 1-800-FDA-1088. Where should I keep my medicine? This drug is given in a hospital or clinic   and will not be stored at home. NOTE: This sheet is a summary. It may not cover all possible information. If you have questions about this medicine, talk to your doctor, pharmacist, or health care provider.  2020 Elsevier/Gold Standard (2017-02-16 11:10:55)  

## 2019-08-19 ENCOUNTER — Other Ambulatory Visit: Payer: Self-pay | Admitting: Oncology

## 2019-08-22 ENCOUNTER — Other Ambulatory Visit: Payer: Medicare Other

## 2019-08-24 ENCOUNTER — Other Ambulatory Visit: Payer: Self-pay | Admitting: Nurse Practitioner

## 2019-08-24 DIAGNOSIS — D693 Immune thrombocytopenic purpura: Secondary | ICD-10-CM

## 2019-08-25 ENCOUNTER — Ambulatory Visit: Payer: Medicare Other | Admitting: Oncology

## 2019-08-25 ENCOUNTER — Other Ambulatory Visit: Payer: Self-pay

## 2019-08-25 ENCOUNTER — Inpatient Hospital Stay (HOSPITAL_BASED_OUTPATIENT_CLINIC_OR_DEPARTMENT_OTHER): Payer: Medicare Other | Admitting: Oncology

## 2019-08-25 ENCOUNTER — Inpatient Hospital Stay: Payer: Medicare Other

## 2019-08-25 ENCOUNTER — Other Ambulatory Visit: Payer: Medicare Other

## 2019-08-25 VITALS — BP 136/55 | HR 61 | Temp 97.6°F | Resp 18 | Ht 64.0 in | Wt 172.5 lb

## 2019-08-25 DIAGNOSIS — D693 Immune thrombocytopenic purpura: Secondary | ICD-10-CM | POA: Diagnosis not present

## 2019-08-25 DIAGNOSIS — I209 Angina pectoris, unspecified: Secondary | ICD-10-CM | POA: Diagnosis not present

## 2019-08-25 DIAGNOSIS — D696 Thrombocytopenia, unspecified: Secondary | ICD-10-CM | POA: Diagnosis not present

## 2019-08-25 LAB — CBC WITH DIFFERENTIAL (CANCER CENTER ONLY)
Abs Immature Granulocytes: 1.8 10*3/uL — ABNORMAL HIGH (ref 0.00–0.07)
Basophils Absolute: 0.2 10*3/uL — ABNORMAL HIGH (ref 0.0–0.1)
Basophils Relative: 1 %
Eosinophils Absolute: 0.1 10*3/uL (ref 0.0–0.5)
Eosinophils Relative: 0 %
HCT: 37.3 % — ABNORMAL LOW (ref 39.0–52.0)
Hemoglobin: 12 g/dL — ABNORMAL LOW (ref 13.0–17.0)
Immature Granulocytes: 10 %
Lymphocytes Relative: 3 %
Lymphs Abs: 0.6 10*3/uL — ABNORMAL LOW (ref 0.7–4.0)
MCH: 26.1 pg (ref 26.0–34.0)
MCHC: 32.2 g/dL (ref 30.0–36.0)
MCV: 81.1 fL (ref 80.0–100.0)
Monocytes Absolute: 2.7 10*3/uL — ABNORMAL HIGH (ref 0.1–1.0)
Monocytes Relative: 15 %
Neutro Abs: 12.5 10*3/uL — ABNORMAL HIGH (ref 1.7–7.7)
Neutrophils Relative %: 71 %
Platelet Count: 362 10*3/uL (ref 150–400)
RBC: 4.6 MIL/uL (ref 4.22–5.81)
RDW: 17.2 % — ABNORMAL HIGH (ref 11.5–15.5)
WBC Count: 17.8 10*3/uL — ABNORMAL HIGH (ref 4.0–10.5)
nRBC: 0.2 % (ref 0.0–0.2)

## 2019-08-25 MED ORDER — ROMIPLOSTIM 125 MCG ~~LOC~~ SOLR
1.0000 ug/kg | Freq: Once | SUBCUTANEOUS | Status: AC
  Administered 2019-08-25: 80 ug via SUBCUTANEOUS
  Filled 2019-08-25: qty 0.16

## 2019-08-25 NOTE — Progress Notes (Signed)
Platelet count increased today to 362K (up from 50K last week). Spoke with Dr. Benay Spice, decrease dose to 1 mcg/kg today due to large increase. Nplate not to be held due to drop in platelets from previous held doses and upcoming procedure.   Demetrius Charity, PharmD, BCPS, [facility] Pharmacist Pharmacy Phone: [phone] 08/25/2019

## 2019-08-25 NOTE — Progress Notes (Signed)
  [facility] OFFICE PROGRESS NOTE   Diagnosis: Thrombocytopenia, history of hairy cell leukemia  INTERVAL HISTORY:   Mr. Hechler returns as scheduled.  He was scheduled for the TAVR procedure 2 weeks ago, but the platelets returned at 11,000 on the preoperative lab.  The prednisone dose was increased and he resumed Nplate.  He reports improved energy and appetite since starting prednisone.  No bleeding.  He continues to have dyspnea with minimal exertion.  He would like to have the TAVR procedure as soon as possible.  Objective:  Vital signs in last 24 hours:  Blood pressure (!) 136/55, pulse 61, temperature 97.6 F (36.4 C), temperature source Temporal, resp. rate 18, height 5' 4" (1.626 m), weight 172 lb 8 oz (78.2 kg), SpO2 97 %.    HEENT: No thrush Resp: Lungs clear bilaterally Cardio: Regular rate and rhythm, 2/6 systolic murmur GI: No hepatosplenomegaly Vascular: 1+ pitting edema at the left greater than right lower leg   Lab Results:  Lab Results  Component Value Date   WBC 17.8 (H) 08/25/2019   HGB 12.0 (L) 08/25/2019   HCT 37.3 (L) 08/25/2019   MCV 81.1 08/25/2019   PLT 362 08/25/2019   NEUTROABS 12.5 (H) 08/25/2019    CMP  Lab Results  Component Value Date   NA 135 08/12/2019   K 4.4 08/12/2019   CL 104 08/12/2019   CO2 22 08/12/2019   GLUCOSE 137 (H) 08/12/2019   BUN 15 08/12/2019   CREATININE 0.98 08/12/2019   CALCIUM 8.8 (L) 08/12/2019   PROT 5.8 (L) 08/12/2019   ALBUMIN 3.4 (L) 08/12/2019   AST 20 08/12/2019   ALT 28 08/12/2019   ALKPHOS 62 08/12/2019   BILITOT 0.9 08/12/2019   GFRNONAA >60 08/12/2019   GFRAA >60 08/12/2019     Medications: I have reviewed the patient's current medications.   Assessment/Plan: 1. Thrombocytopenia ? Bone marrow biopsy 07/08/2016-no evidence of B-cell lymphoma, 40% cellular bone marrow with trilineage hematopoiesis, megakaryocytes present with normal morphology, normal cytogenetics, negative for  BRAF mutation ? Flow cytometry 01/05/2009-no monoclonal B-cell or phenotypically abnormal T-cell population ? Prednisone starting 06/29/2019 ? Nplate 07/04/2019, 3/84/5364, Nplate 07/18/2019 ? Prednisone 20 mg daily beginning 07/25/2019 ? Platelets 11,000 on 08/15/2019, prednisone increased to 40 mg daily, Nplate resume 6/80/3212  ? Prednisone decreased to 30 mg daily 08/25/2019, Nplate continued 2. Hairy cell leukemia 1982, status post splenectomy 3. Coronary artery disease 4. Aortic stenosis 5. 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 Macular degeneration 7. Hearing loss 8. Basal cell carcinoma left cheek 05/24/2019 9. Left upper lobe nodule, faint FDG activity on PET at [facility] 05/31/2013    Disposition: Mr. Buerkle has thrombocytopenia, most likely secondary to ITP.  The platelet count responded to prednisone and Nplate, but fell quickly when the Nplate was discontinued and prednisone tapered.  The platelet count has again responded with resumption of Nplate and an increased dose of prednisone.  We tapered the prednisone to 30 mg daily.  The Nplate will be continued.  The platelet count is now in an adequate range to proceed with a TAVR procedure.  Mr. Aument will return for a CBC in 1 week.  He will be scheduled for an office visit on 09/09/2019.  Betsy Coder, MD  08/25/2019  10:56 AM

## 2019-08-25 NOTE — Patient Instructions (Signed)
Romiplostim injection What is this medicine? ROMIPLOSTIM (roe mi PLOE stim) helps your body make more platelets. This medicine is used to treat low platelets caused by chronic idiopathic thrombocytopenic purpura (ITP). This medicine may be used for other purposes; ask your health care provider or pharmacist if you have questions. COMMON BRAND NAME(S): Nplate What should I tell my health care provider before I take this medicine? They need to know if you have any of these conditions:  bleeding disorders  bone marrow problem, like blood cancer or myelodysplastic syndrome  history of blood clots  liver disease  surgery to remove your spleen  an unusual or allergic reaction to romiplostim, mannitol, other medicines, foods, dyes, or preservatives  pregnant or trying to get pregnant  breast-feeding How should I use this medicine? This medicine is for injection under the skin. It is given by a health care professional in a hospital or clinic setting. A special MedGuide will be given to you before your injection. Read this information carefully each time. Talk to your pediatrician regarding the use of this medicine in children. While this drug may be prescribed for children as young as 1 year for selected conditions, precautions do apply. Overdosage: If you think you have taken too much of this medicine contact a poison control center or emergency room at once. NOTE: This medicine is only for you. Do not share this medicine with others. What if I miss a dose? It is important not to miss your dose. Call your doctor or health care professional if you are unable to keep an appointment. What may interact with this medicine? Interactions are not expected. This list may not describe all possible interactions. Give your health care provider a list of all the medicines, herbs, non-prescription drugs, or dietary supplements you use. Also tell them if you smoke, drink alcohol, or use illegal drugs.  Some items may interact with your medicine. What should I watch for while using this medicine? Your condition will be monitored carefully while you are receiving this medicine. Visit your prescriber or health care professional for regular checks on your progress and for the needed blood tests. It is important to keep all appointments. What side effects may I notice from receiving this medicine? Side effects that you should report to your doctor or health care professional as soon as possible:  allergic reactions like skin rash, itching or hives, swelling of the face, lips, or tongue  signs and symptoms of bleeding such as bloody or black, tarry stools; red or dark brown urine; spitting up blood or brown material that looks like coffee grounds; red spots on the skin; unusual bruising or bleeding from the eyes, gums, or nose  signs and symptoms of a blood clot such as chest pain; shortness of breath; pain, swelling, or warmth in the leg  signs and symptoms of a stroke like changes in vision; confusion; trouble speaking or understanding; severe headaches; sudden numbness or weakness of the face, arm or leg; trouble walking; dizziness; loss of balance or coordination Side effects that usually do not require medical attention (report to your doctor or health care professional if they continue or are bothersome):  headache  pain in arms and legs  pain in mouth  stomach pain This list may not describe all possible side effects. Call your doctor for medical advice about side effects. You may report side effects to FDA at 1-800-FDA-1088. Where should I keep my medicine? This drug is given in a hospital or clinic   and will not be stored at home. NOTE: This sheet is a summary. It may not cover all possible information. If you have questions about this medicine, talk to your doctor, pharmacist, or health care provider.  2020 Elsevier/Gold Standard (2017-02-16 11:10:55)  

## 2019-08-26 ENCOUNTER — Telehealth: Payer: Self-pay | Admitting: Nurse Practitioner

## 2019-08-26 NOTE — Telephone Encounter (Signed)
Scheduled appts per 6/24 los. Pt confirmed appt date and time.  

## 2019-08-29 ENCOUNTER — Other Ambulatory Visit: Payer: Self-pay

## 2019-08-29 DIAGNOSIS — I350 Nonrheumatic aortic (valve) stenosis: Secondary | ICD-10-CM

## 2019-09-01 ENCOUNTER — Inpatient Hospital Stay: Payer: Medicare Other

## 2019-09-01 ENCOUNTER — Inpatient Hospital Stay: Payer: Medicare Other | Attending: Oncology

## 2019-09-01 ENCOUNTER — Other Ambulatory Visit: Payer: Self-pay

## 2019-09-01 VITALS — BP 130/59 | HR 61 | Temp 98.2°F | Resp 16

## 2019-09-01 DIAGNOSIS — D693 Immune thrombocytopenic purpura: Secondary | ICD-10-CM

## 2019-09-01 DIAGNOSIS — D696 Thrombocytopenia, unspecified: Secondary | ICD-10-CM | POA: Insufficient documentation

## 2019-09-01 LAB — CBC WITH DIFFERENTIAL (CANCER CENTER ONLY)
Abs Immature Granulocytes: 1.55 10*3/uL — ABNORMAL HIGH (ref 0.00–0.07)
Basophils Absolute: 0.1 10*3/uL (ref 0.0–0.1)
Basophils Relative: 1 %
Eosinophils Absolute: 0.1 10*3/uL (ref 0.0–0.5)
Eosinophils Relative: 1 %
HCT: 36.6 % — ABNORMAL LOW (ref 39.0–52.0)
Hemoglobin: 11.7 g/dL — ABNORMAL LOW (ref 13.0–17.0)
Immature Granulocytes: 11 %
Lymphocytes Relative: 5 %
Lymphs Abs: 0.7 10*3/uL (ref 0.7–4.0)
MCH: 25.8 pg — ABNORMAL LOW (ref 26.0–34.0)
MCHC: 32 g/dL (ref 30.0–36.0)
MCV: 80.8 fL (ref 80.0–100.0)
Monocytes Absolute: 3.1 10*3/uL — ABNORMAL HIGH (ref 0.1–1.0)
Monocytes Relative: 22 %
Neutro Abs: 8.9 10*3/uL — ABNORMAL HIGH (ref 1.7–7.7)
Neutrophils Relative %: 60 %
Platelet Count: 161 10*3/uL (ref 150–400)
RBC: 4.53 MIL/uL (ref 4.22–5.81)
RDW: 17.1 % — ABNORMAL HIGH (ref 11.5–15.5)
WBC Count: 14.4 10*3/uL — ABNORMAL HIGH (ref 4.0–10.5)
nRBC: 0.1 % (ref 0.0–0.2)

## 2019-09-01 MED ORDER — ROMIPLOSTIM 125 MCG ~~LOC~~ SOLR
1.0000 ug/kg | Freq: Once | SUBCUTANEOUS | Status: AC
  Administered 2019-09-01: 80 ug via SUBCUTANEOUS
  Filled 2019-09-01: qty 0.16

## 2019-09-01 NOTE — Pre-Procedure Instructions (Addendum)
[name]  09/01/2019      [facility] DRUG STORE [address], [address] [address] Phone: [phone] Fax: [phone]  [facility] [place], [place] [address] Phone: [phone] Fax: [phone]    Your procedure is scheduled on July 6  Report to [facility] entrance A at 8:00 A.M.  Call this number if you have problems the morning of surgery:  [phone]   Remember:  Do not eat or drink after midnight.      Take these medicines the morning of surgery with A SIP OF WATER  None                7 days prior to surgery STOP taking any Aspirin (unless otherwise instructed by your surgeon), Aleve, Naproxen, Ibuprofen, Motrin, Advil, Goody's, BC's, all herbal medications, fish oil, and all vitamins.    Do not wear jewelry.  Do not wear lotions, powders, or perfumes, or deodorant.  Do not shave 48 hours prior to surgery.  Men may shave face and neck.  Do not bring valuables to the hospital.  [facility] is not responsible for any belongings or valuables.  Contacts, dentures or bridgework may not be worn into surgery.  Leave your suitcase in the car.  After surgery it may be brought to your room.  For patients admitted to the hospital, discharge time will be determined by your treatment team.  Patients discharged the day of surgery will not be allowed to drive home.    Special instructions:   - Preparing For Surgery  Before surgery, you can play an important role. Because skin is not sterile, your skin needs to be as free of germs as possible. You can reduce the number of germs on your skin by washing with CHG (chlorahexidine gluconate) Soap before surgery.  CHG is an antiseptic cleaner which kills germs and bonds with the skin to continue killing germs even after  washing.    Oral Hygiene is also important to reduce your risk of infection.  Remember - BRUSH YOUR TEETH THE MORNING OF SURGERY WITH YOUR REGULAR TOOTHPASTE  Please do not use if you have an allergy to CHG or antibacterial soaps. If your skin becomes reddened/irritated stop using the CHG.  Do not shave (including legs and underarms) for at least 48 hours prior to first CHG shower. It is OK to shave your face.  Please follow these instructions carefully.   1. Shower the NIGHT BEFORE SURGERY and the MORNING OF SURGERY with CHG.   2. If you chose to wash your hair, wash your hair first as usual with your normal shampoo.  3. After you shampoo, rinse your hair and body thoroughly to remove the shampoo.  4. Use CHG as you would any other liquid soap. You can apply CHG directly to the skin and wash gently with a scrungie or a clean washcloth.   5. Apply the CHG Soap to your body ONLY FROM THE NECK DOWN.  Do not use on open wounds or open sores. Avoid contact with your eyes, ears, mouth and genitals (private parts). Wash Face and genitals (private parts)  with your normal soap.  6. Wash thoroughly, paying special attention to the area where your surgery will be performed.  7. Thoroughly rinse your  body with warm water from the neck down.  8. DO NOT shower/wash with your normal soap after using and rinsing off the CHG Soap.  9. Pat yourself dry with a CLEAN TOWEL.  10. Wear CLEAN PAJAMAS to bed the night before surgery, wear comfortable clothes the morning of surgery  11. Place CLEAN SHEETS on your bed the night of your first shower and DO NOT SLEEP WITH PETS.    Day of Surgery:  Do not apply any deodorants/lotions.  Please wear clean clothes to the hospital/surgery center.   Remember to brush your teeth WITH YOUR REGULAR TOOTHPASTE.    Please read over the following fact sheets that you were given. Coughing and Deep Breathing and Surgical Site Infection Prevention

## 2019-09-02 ENCOUNTER — Encounter (HOSPITAL_COMMUNITY): Payer: Self-pay

## 2019-09-02 ENCOUNTER — Other Ambulatory Visit: Payer: Self-pay

## 2019-09-02 ENCOUNTER — Encounter (HOSPITAL_COMMUNITY)
Admission: RE | Admit: 2019-09-02 | Discharge: 2019-09-02 | Disposition: A | Discharge status: Home / Self Care | Payer: Medicare Other | Source: Ambulatory Visit | Attending: Cardiovascular Disease | Admitting: Cardiovascular Disease

## 2019-09-02 DIAGNOSIS — Z87891 Personal history of nicotine dependence: Secondary | ICD-10-CM | POA: Insufficient documentation

## 2019-09-02 DIAGNOSIS — Z79899 Other long term (current) drug therapy: Secondary | ICD-10-CM | POA: Diagnosis not present

## 2019-09-02 DIAGNOSIS — I350 Nonrheumatic aortic (valve) stenosis: Secondary | ICD-10-CM

## 2019-09-02 DIAGNOSIS — K219 Gastro-esophageal reflux disease without esophagitis: Secondary | ICD-10-CM | POA: Insufficient documentation

## 2019-09-02 DIAGNOSIS — Z856 Personal history of leukemia: Secondary | ICD-10-CM | POA: Diagnosis not present

## 2019-09-02 DIAGNOSIS — Z955 Presence of coronary angioplasty implant and graft: Secondary | ICD-10-CM | POA: Diagnosis not present

## 2019-09-02 DIAGNOSIS — I2510 Atherosclerotic heart disease of native coronary artery without angina pectoris: Secondary | ICD-10-CM | POA: Insufficient documentation

## 2019-09-02 DIAGNOSIS — Z85038 Personal history of other malignant neoplasm of large intestine: Secondary | ICD-10-CM | POA: Diagnosis not present

## 2019-09-02 DIAGNOSIS — I10 Essential (primary) hypertension: Secondary | ICD-10-CM | POA: Diagnosis not present

## 2019-09-02 DIAGNOSIS — Z85828 Personal history of other malignant neoplasm of skin: Secondary | ICD-10-CM | POA: Diagnosis not present

## 2019-09-02 DIAGNOSIS — E785 Hyperlipidemia, unspecified: Secondary | ICD-10-CM | POA: Insufficient documentation

## 2019-09-02 DIAGNOSIS — Z01818 Encounter for other preprocedural examination: Secondary | ICD-10-CM | POA: Insufficient documentation

## 2019-09-02 DIAGNOSIS — R918 Other nonspecific abnormal finding of lung field: Secondary | ICD-10-CM | POA: Insufficient documentation

## 2019-09-02 HISTORY — DX: Cardiac murmur, unspecified: R01.1

## 2019-09-02 HISTORY — DX: Disease of blood and blood-forming organs, unspecified: D75.9

## 2019-09-02 HISTORY — DX: Pneumonia, unspecified organism: J18.9

## 2019-09-02 HISTORY — DX: Hairy cell leukemia not having achieved remission: C91.40

## 2019-09-02 HISTORY — DX: Dyspnea, unspecified: R06.00

## 2019-09-02 LAB — COMPREHENSIVE METABOLIC PANEL
ALT: 21 U/L (ref 0–44)
AST: 20 U/L (ref 15–41)
Albumin: 3.3 g/dL — ABNORMAL LOW (ref 3.5–5.0)
Alkaline Phosphatase: 64 U/L (ref 38–126)
Anion gap: 9 (ref 5–15)
BUN: 21 mg/dL (ref 8–23)
CO2: 23 mmol/L (ref 22–32)
Calcium: 8.9 mg/dL (ref 8.9–10.3)
Chloride: 108 mmol/L (ref 98–111)
Creatinine, Ser: 0.99 mg/dL (ref 0.61–1.24)
GFR calc Af Amer: >60 mL/min (ref >60)
GFR calc non Af Amer: >60 mL/min (ref >60)
Glucose, Bld: 91 mg/dL (ref 70–99)
Potassium: 4.1 mmol/L (ref 3.5–5.1)
Sodium: 140 mmol/L (ref 135–145)
Total Bilirubin: 1 mg/dL (ref 0.3–1.2)
Total Protein: 5.7 g/dL — ABNORMAL LOW (ref 6.5–8.1)

## 2019-09-02 LAB — URINALYSIS, ROUTINE W REFLEX MICROSCOPIC
Bilirubin Urine: NEGATIVE
Glucose, UA: NEGATIVE mg/dL
Hgb urine dipstick: NEGATIVE
Ketones, ur: NEGATIVE mg/dL
Leukocytes,Ua: NEGATIVE
Nitrite: NEGATIVE
Protein, ur: NEGATIVE mg/dL
Specific Gravity, Urine: 1.018 (ref 1.005–1.030)
pH: 6 (ref 5.0–8.0)

## 2019-09-02 LAB — BLOOD GAS, ARTERIAL
Acid-Base Excess: 0.6 mmol/L (ref 0.0–2.0)
Bicarbonate: 24.2 mmol/L (ref 20.0–28.0)
FIO2: 21
O2 Saturation: 98.5 %
Patient temperature: 37
pCO2 arterial: 35.4 mmHg (ref 32.0–48.0)
pH, Arterial: 7.449 (ref 7.350–7.450)
pO2, Arterial: 101 mmHg (ref 83.0–108.0)

## 2019-09-02 LAB — HEMOGLOBIN A1C
Hgb A1c MFr Bld: 6.5 % — ABNORMAL HIGH (ref 4.8–5.6)
Mean Plasma Glucose: 139.85 mg/dL

## 2019-09-02 LAB — TYPE AND SCREEN
ABO/RH(D): O POS
Antibody Screen: NEGATIVE

## 2019-09-02 LAB — APTT: aPTT: 32 seconds (ref 24–36)

## 2019-09-02 LAB — CBC
HCT: 39.1 % (ref 39.0–52.0)
Hemoglobin: 12.4 g/dL — ABNORMAL LOW (ref 13.0–17.0)
MCH: 25.8 pg — ABNORMAL LOW (ref 26.0–34.0)
MCHC: 31.7 g/dL (ref 30.0–36.0)
MCV: 81.3 fL (ref 80.0–100.0)
Platelets: 143 10*3/uL — ABNORMAL LOW (ref 150–400)
RBC: 4.81 MIL/uL (ref 4.22–5.81)
RDW: 17.2 % — ABNORMAL HIGH (ref 11.5–15.5)
WBC: 13.5 10*3/uL — ABNORMAL HIGH (ref 4.0–10.5)
nRBC: 0 % (ref 0.0–0.2)

## 2019-09-02 LAB — PROTIME-INR
INR: 1 (ref 0.8–1.2)
Prothrombin Time: 13 seconds (ref 11.4–15.2)

## 2019-09-02 LAB — BRAIN NATRIURETIC PEPTIDE: B Natriuretic Peptide: 296.9 pg/mL — ABNORMAL HIGH (ref 0.0–100.0)

## 2019-09-02 MED ORDER — SODIUM CHLORIDE 0.9 % IV SOLN
1.5000 g | INTRAVENOUS | Status: AC
  Administered 2019-09-06: 1.5 g via INTRAVENOUS
  Filled 2019-09-02: qty 1.5

## 2019-09-02 MED ORDER — POTASSIUM CHLORIDE 2 MEQ/ML IV SOLN
80.0000 meq | INTRAVENOUS | Status: DC
  Filled 2019-09-02: qty 40

## 2019-09-02 MED ORDER — DEXMEDETOMIDINE HCL IN NACL 400 MCG/100ML IV SOLN
0.1000 ug/kg/h | INTRAVENOUS | Status: AC
  Administered 2019-09-06: 1 ug/kg/h via INTRAVENOUS
  Filled 2019-09-02: qty 100

## 2019-09-02 MED ORDER — SODIUM CHLORIDE 0.9 % IV SOLN
INTRAVENOUS | Status: DC
  Filled 2019-09-02: qty 30

## 2019-09-02 MED ORDER — MAGNESIUM SULFATE 50 % IJ SOLN
40.0000 meq | INTRAMUSCULAR | Status: DC
  Filled 2019-09-02: qty 9.85

## 2019-09-02 MED ORDER — VANCOMYCIN HCL 1250 MG/250ML IV SOLN
1250.0000 mg | INTRAVENOUS | Status: AC
  Administered 2019-09-06: 1250 mg via INTRAVENOUS
  Filled 2019-09-02: qty 250

## 2019-09-02 MED ORDER — NOREPINEPHRINE 4 MG/250ML-% IV SOLN
0.0000 ug/min | INTRAVENOUS | Status: AC
  Administered 2019-09-06: 2 ug/min via INTRAVENOUS
  Filled 2019-09-02: qty 250

## 2019-09-02 NOTE — Progress Notes (Signed)
PCP - [name], MD Cardiologist - Daneen Schick, MD  PPM/ICD - Denies  Chest x-ray - 09/02/19 EKG - 08/15/19 Stress Test - 03/13/14 ECHO - 06/13/19 Cardiac Cath - 07/26/19  Sleep Study - Denies  Patient denies being diabetic.  Blood Thinner Instructions: N/A Aspirin Instructions: N/A  ERAS Protcol - N/A PRE-SURGERY Ensure or G2- N/A  COVID TEST- 08/08/19   Anesthesia review: Yes, cardiac hx.  Patient denies shortness of breath, fever, cough and chest pain at PAT appointment   All instructions explained to the patient, with a verbal understanding of the material. Patient agrees to go over the instructions while at home for a better understanding. Patient also instructed to self quarantine after being tested for COVID-19. The opportunity to ask questions was provided.

## 2019-09-02 NOTE — Progress Notes (Signed)
Anesthesia Chart Review:  Case: [id number] Date/Time: 09/06/19 1030   Procedures:      TRANSCATHETER AORTIC VALVE REPLACEMENT, TRANSFEMORAL (N/A Chest)     TRANSESOPHAGEAL ECHOCARDIOGRAM (TEE) (N/A )   Anesthesia type: General   Pre-op diagnosis: Severe Aortic Stenosis   Location: [facility] OR ROOM 16 / [facility]   Surgeons: Burnell Blanks, MD    CT Surgeon: Gilford Raid, MD    DISCUSSION: Patient is a 86 year old male scheduled for the above procedure. Procedure was initially scheduled for 08/16/19, but was postponed due to recurrent thrombocytopenia (as low as 11K). Since then he was re-evaluated by hematology. Thrombocytopenia noted in April, eventually received prednisone and Nplate with improvement; however, fell quickly when Nplate was discontinued and prednisone tapered. PLT count responded again with resumption of Nplate and increased dose of prednisone (which is now tapered to 30 mg daily per 08/25/19 note by Dr. Benay Spice). Last Nplate 08/25/19.Thrombocytopenia felt most likely secondary to ITP. PLT count on 09/02/19 was 143K.  History includes former smoker, hairy cell leukemia (1982, s/p splenectomy), thrombocytopenia (likely ITP 08/2019), anemia, CAD (DES x2 RCA, medical therapy for CTO dLAD with collaterals 03/14/14), severe AS, liposarcoma (right chest wall, resection 2013), skin cancer ([facility], left cheek 05/24/19), glaucoma, hearing loss, colon cancer (1984), dyspnea. LUL lung nodule 2.6 cm 2015, 3.6 cm 07/29/19 CTA, per radiologist "relatively slow rate of growth is suggestive of a benign neoplasm such as pulmonary hamartoma, although a low-grade malignancy cannot be excluded.PET-CT may be obtained for further characterization as clinically warranted."  Preoperative COVID-19 test is scheduled for 09/03/2019. Anesthesia team to evaluate on the day of surgery.  Per cardiology communication: "Plan tempo wire from the IJ, no neck lines".   VS: BP (!) 150/72   Pulse (!) 59   Temp 36.5 C (Oral)    Resp 17   Ht 5\' 4"  (1.626 m)   Wt 77.5 kg   SpO2 95%   BMI 29.32 kg/m   PROVIDERS: [name], MD is PCP  Betsy Coder, MD is HEM-ONC. Last visit 08/25/19.    LABS: Labs reviewed: Acceptable for surgery. (all labs ordered are listed, but only abnormal results are displayed)  Labs Reviewed  BLOOD GAS, ARTERIAL - Abnormal; Notable for the following components:      Result Value   Allens test (pass/fail) BRACHIAL ARTERY (*)    All other components within normal limits  BRAIN NATRIURETIC PEPTIDE - Abnormal; Notable for the following components:   B Natriuretic Peptide 296.9 (*)    All other components within normal limits  CBC - Abnormal; Notable for the following components:   WBC 13.5 (*)    Hemoglobin 12.4 (*)    MCH 25.8 (*)    RDW 17.2 (*)    Platelets 143 (*)    All other components within normal limits  COMPREHENSIVE METABOLIC PANEL - Abnormal; Notable for the following components:   Total Protein 5.7 (*)    Albumin 3.3 (*)    All other components within normal limits  HEMOGLOBIN A1C - Abnormal; Notable for the following components:   Hgb A1c MFr Bld 6.5 (*)    All other components within normal limits  APTT  PROTIME-INR  URINALYSIS, ROUTINE W REFLEX MICROSCOPIC  TYPE AND SCREEN     IMAGES: CXR 09/02/19: FINDINGS: Cardiac shadow is mildly enlarged. Stable left upper lobe lung mass is noted. No focal infiltrate is seen. Degenerative changes of the thoracic spine are noted. IMPRESSION: Stable left upper lobe mass.  No  new focal abnormality is noted.  CTA Chest/abd/pelvis 07/29/19 (ordered by Lauree Chandler, MD): IMPRESSION: 1. Vascular findings and measurements pertinent to potential TAVR procedure, as detailed. 2. Severe thickening and calcification of the aortic valve, compatible with the reported history of severe symptomatic aortic stenosis. 3. Mild cardiomegaly.  Two-vessel coronary atherosclerosis. 4. Circumscribed 3.6 cm solid central  left upper lung mass, increased from 2.6 cm on 2015 chest CT study. Relatively slow rate of growth is suggestive of a benign neoplasm such as pulmonary hamartoma, although a low-grade malignancy cannot be excluded. PET-CT may be obtained for further characterization as clinically warranted. 5. Moderate prostatomegaly. 6. Minimal sigmoid diverticulosis. 7.  Aortic Atherosclerosis (ICD10-I70.0).    EKG: 08/15/19: Normal sinus rhythm Low voltage QRS Borderline ECG   CV: Carotid US 07/29/19: Summary:  Right Carotid: Velocities in the right ICA are consistent with a 1-39%  stenosis.  Left Carotid: Velocities in the left ICA are consistent with a 1-39%  stenosis.  Vertebrals: Bilateral vertebral arteries demonstrate antegrade flow.    CT Coronary 07/29/19: IMPRESSION: 1. Tri leaflet AV with annular area of 484 mm 2 suitable for a 26 mm Sapien 3 valve 2. Optimum angiographic angle for deployment LAO 15 Caudal 17 degrees 3.  Coronary arteries suitable height above annulus for deployment 4.  Normal aortic root 3.6 cm   Cardiac cath 07/26/19:  Previously documented severe calcific aortic stenosis by echocardiography.  There is peak to peak gradient of 48 mmHg in the Cath Lab with a calculated aortic valve area of 1.56 cm which is discongruent with the patient's symptoms and echo results.  Total occlusion mid to distal LAD, chronic.  Fills by right to left and left to left collaterals.  Third diagonal branch contains 90% stenosis.  First diagonal contains ostial proximal 60 to 70% narrowing.  Left main has ectasia but no significant obstruction.  Ostium is 30% narrowed.  Circumflex is very small without any meaningful obtuse marginal branches.  Mid RCA above previously stented distal segment contains 50 to 60% narrowing.  Normal right heart pressures. RECOMMENDATIONS:  Referred to structural heart team, Dr. Angelena Form for further evaluation.  Major clinical issue is idiopathic  thrombocytopenia which has responded nicely to 170,000 by use of prednisone and Nplate.   Echo 06/13/19: IMPRESSIONS  1. Left ventricular ejection fraction, by estimation, is 60 to 65%. The  left ventricle has normal function. The left ventricle has no regional  wall motion abnormalities. Left ventricular diastolic parameters are  consistent with Grade I diastolic  dysfunction (impaired relaxation). Elevated left atrial pressure.  2. Right ventricular systolic function is normal. The right ventricular  size is normal. There is mildly elevated pulmonary artery systolic  pressure.  3. Left atrial size was mildly dilated.  4. The mitral valve is abnormal. Mild mitral valve regurgitation.  5. AV is thickened, calcified with restricted motion. Peak and mean  gradients through the valve are 67 and 43 mm Hg respectively. Thisk in  increased from echo report of 2020. LVOT/AV VTI ratio is 024 consistent  with severe AS.Marland Kitchen The aortic valve is  tricuspid. Aortic valve regurgitation is moderate.  6. Aortic dilatation noted. There is mild dilatation of the ascending  aorta measuring 39 mm.  - Comparison(s): 12/27/18 EF 60-65%. Moderate AS 3mmHg mean PG.    Past Medical History:  Diagnosis Date  . Acute appendicitis   . Anemia   . Anxiety   . Arthritis    "back" (03/14/2014)  . Blood dyscrasia  hairy cell leukemia  . CAD (coronary artery disease)    a. 03/14/14  s/p overlapping DES x2 to mid-distal RCA.  . 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 Colon cancer ([facility]) 1984  . Compression fracture of lumbar spine, non-traumatic ([place])   . DJD (degenerative joint disease) of lumbar spine   . Dyslipidemia   . Dyspnea   . Elevated PSA   . GERD (gastroesophageal reflux disease)   . Glaucoma   . Hairy cell leukemia ([place]) dx'd 1980  . Heart murmur   . History of blood transfusion    "several; related to hairy cell leukemia & tx "  . History of stomach ulcers 1968  .  Hypertension   . Leukemia, hairy cell ([place])   . Malnutrition ([place])   . Osteoarthritis   . Osteoporosis   . Pneumonia   . Severe aortic stenosis   . Skin cancer of face     Past Surgical History:  Procedure Laterality Date  . APPENDECTOMY  10/2007  . CATARACT EXTRACTION, BILATERAL Bilateral 02/2008  . COLON SURGERY  1984   "sigmoid; open"  . CORONARY ANGIOPLASTY WITH STENT PLACEMENT  03/14/2014   "2"  . EYE SURGERY Bilateral    cataract  . FRACTURE SURGERY    . LEFT HEART CATHETERIZATION WITH CORONARY ANGIOGRAM N/A 03/14/2014   Procedure: LEFT HEART CATHETERIZATION WITH CORONARY ANGIOGRAM;  Surgeon: Peter M Martinique, MD;  Location: [facility] CATH LAB;  Service: Cardiovascular;  Laterality: N/A;  . LIPOMA EXCISION Right 07/2011   liposarcoma resection; "back"  . MOHS SURGERY Left 02/2011  . MOHS SURGERY  X 3   "all on my face"  . MOLE REMOVAL Left 1985   cheek  . ORIF ANKLE FRACTURE Left 2000  . RIGHT/LEFT HEART CATH AND CORONARY ANGIOGRAPHY N/A 07/26/2019   Procedure: RIGHT/LEFT HEART CATH AND CORONARY ANGIOGRAPHY;  Surgeon: Belva Crome, MD;  Location: [facility] CV LAB;  Service: Cardiovascular;  Laterality: N/A;  . SHOULDER SURGERY  04/2004  . SPLENECTOMY  1992  . TONSILLECTOMY AND ADENOIDECTOMY  1939    MEDICATIONS: . atorvastatin (LIPITOR) 20 MG tablet  . Cholecalciferol (VITAMIN D3) 50 MCG (2000 UT) TABS  . desonide (DESOWEN) 0.05 % cream  . fluocinonide (LIDEX) 0.05 % external solution  . latanoprost (XALATAN) 0.005 % ophthalmic solution  . metoprolol tartrate (LOPRESSOR) 25 MG tablet  . Multiple Vitamins-Minerals (PRESERVISION AREDS 2 PO)  . nitroGLYCERIN (NITROSTAT) 0.4 MG SL tablet  . pantoprazole (PROTONIX) 40 MG tablet  . predniSONE (DELTASONE) 10 MG tablet  . ranibizumab (LUCENTIS) 0.5 MG/0.05ML SOLN  . sertraline (ZOLOFT) 50 MG tablet  . tamsulosin (FLOMAX) 0.4 MG CAPS capsule  . timolol (BETIMOL) 0.5 % ophthalmic solution  . triamcinolone cream (KENALOG) 0.1 %    No current facility-administered medications for this encounter.   Derrill Memo ON 09/06/2019] cefUROXime (ZINACEF) 1.5 g in sodium chloride 0.9 % 100 mL IVPB  . [START ON 09/06/2019] dexmedetomidine (PRECEDEX) 400 MCG/100ML (4 mcg/mL) infusion  . [START ON 09/06/2019] heparin 30,000 units/NS 1000 mL solution for CELLSAVER  . [START ON 09/06/2019] magnesium sulfate (IV Push/IM) injection 40 mEq  . [START ON 09/06/2019] norepinephrine (LEVOPHED) 4mg  in 271mL premix infusion  . [START ON 09/06/2019] potassium chloride injection 80 mEq  . [START ON 09/06/2019] vancomycin (VANCOREADY) IVPB 1250 mg/250 mL    Myra Gianotti, PA-C Surgical Short Stay/Anesthesiology [facility] Phone [phone] [facility] Phone [phone] 09/02/2019 4:54 PM

## 2019-09-02 NOTE — Pre-Procedure Instructions (Signed)
Your procedure is scheduled on Tuesday, July 6 from 10:45 AM- 12:43 PM.  Report to [facility] Main Entrance "A" at 08:45 A.M., and check in at the Admitting office.  Call this number if you have problems the morning of surgery:  [phone].    Remember:  Do not eat after midnight the night before your surgery.    Continue taking all current medications without change through the day before surgery.   On the morning of surgery do not take any medications.       The Morning of Surgery:                Do not wear jewelry.            Do not wear lotions, powders, colognes, or deodorant.            Men may shave face and neck.            Do not bring valuables to the hospital.            [facility] is not responsible for any belongings or valuables.  Do NOT Smoke (Tobacco/Vaping) or drink Alcohol 24 hours prior to your procedure.  If you use a CPAP at night, you may bring all equipment for your overnight stay.   Contacts, glasses, dentures or bridgework may not be worn into surgery.      For patients admitted to the hospital, discharge time will be determined by your treatment team.   Patients discharged the day of surgery will not be allowed to drive home, and someone needs to stay with them for 24 hours.    Special instructions:   [place]- Preparing For Surgery  Before surgery, you can play an important role. Because skin is not sterile, your skin needs to be as free of germs as possible. You can reduce the number of germs on your skin by washing with CHG (chlorahexidine gluconate) Soap before surgery.  CHG is an antiseptic cleaner which kills germs and bonds with the skin to continue killing germs even after washing.    Oral Hygiene is also important to reduce your risk of infection.  Remember - BRUSH YOUR TEETH THE MORNING OF SURGERY WITH YOUR REGULAR TOOTHPASTE  Please do not use if you have an allergy to CHG or antibacterial soaps. If your skin becomes  reddened/irritated stop using the CHG.  Do not shave (including legs and underarms) for at least 48 hours prior to first CHG shower. It is OK to shave your face.  Please follow these instructions carefully.   1. Shower the NIGHT BEFORE SURGERY and the MORNING OF SURGERY with CHG Soap.   2. If you chose to wash your hair, wash your hair first as usual with your normal shampoo.  3. After you shampoo, rinse your hair and body thoroughly to remove the shampoo.  4. Use CHG as you would any other liquid soap. You can apply CHG directly to the skin and wash gently with a scrungie or a clean washcloth.   5. Apply the CHG Soap to your body ONLY FROM THE NECK DOWN.  Do not use on open wounds or open sores. Avoid contact with your eyes, ears, mouth and genitals (private parts). Wash Face and genitals (private parts)  with your normal soap.   6. Wash thoroughly, paying special attention to the area where your surgery will be performed.  7. Thoroughly rinse your body with warm water from the neck down.  8. DO NOT shower/wash with your normal soap after using and rinsing off the CHG Soap.  9. Pat yourself dry with a CLEAN TOWEL.  10. Wear CLEAN PAJAMAS to bed the night before surgery  11. Place CLEAN SHEETS on your bed the night of your first shower and DO NOT SLEEP WITH PETS.   Day of Surgery:             Shower with CHG Soap. Wear Clean/Comfortable clothing the morning of surgery Do not apply any deodorants/lotions.   Remember to brush your teeth WITH YOUR REGULAR TOOTHPASTE.   Please read over the following fact sheets that you were given.

## 2019-09-03 ENCOUNTER — Other Ambulatory Visit (HOSPITAL_COMMUNITY)
Admission: RE | Admit: 2019-09-03 | Discharge: 2019-09-03 | Disposition: A | Discharge status: Home / Self Care | Payer: Medicare Other | Source: Ambulatory Visit | Attending: Cardiovascular Disease | Admitting: Cardiovascular Disease

## 2019-09-03 LAB — SARS CORONAVIRUS 2 (TAT 6-24 HRS): SARS Coronavirus 2: NEGATIVE

## 2019-09-04 ENCOUNTER — Encounter: Payer: Self-pay | Admitting: Oncology

## 2019-09-06 ENCOUNTER — Inpatient Hospital Stay (HOSPITAL_COMMUNITY): Payer: Medicare Other

## 2019-09-06 ENCOUNTER — Encounter (HOSPITAL_COMMUNITY): Payer: Self-pay | Admitting: Cardiovascular Disease

## 2019-09-06 ENCOUNTER — Other Ambulatory Visit: Payer: Self-pay

## 2019-09-06 ENCOUNTER — Encounter (HOSPITAL_COMMUNITY)
Admission: RE | Disposition: A | Discharge status: Home Health | Payer: Self-pay | Source: Home / Self Care | Attending: Cardiovascular Disease

## 2019-09-06 ENCOUNTER — Inpatient Hospital Stay (HOSPITAL_COMMUNITY)
Admission: RE | Admit: 2019-09-06 | Discharge: 2019-09-09 | DRG: 266 | Disposition: A | Discharge status: Home Health | Payer: Medicare Other | Attending: Cardiovascular Disease | Admitting: Cardiovascular Disease

## 2019-09-06 ENCOUNTER — Inpatient Hospital Stay (HOSPITAL_COMMUNITY): Payer: Medicare Other | Admitting: Physician Assistant

## 2019-09-06 ENCOUNTER — Inpatient Hospital Stay (HOSPITAL_COMMUNITY): Payer: Medicare Other | Admitting: Vascular Surgery

## 2019-09-06 DIAGNOSIS — K219 Gastro-esophageal reflux disease without esophagitis: Secondary | ICD-10-CM | POA: Diagnosis present

## 2019-09-06 DIAGNOSIS — D693 Immune thrombocytopenic purpura: Secondary | ICD-10-CM

## 2019-09-06 DIAGNOSIS — Q859 Phakomatosis, unspecified: Secondary | ICD-10-CM

## 2019-09-06 DIAGNOSIS — Z85828 Personal history of other malignant neoplasm of skin: Secondary | ICD-10-CM

## 2019-09-06 DIAGNOSIS — Z8711 Personal history of peptic ulcer disease: Secondary | ICD-10-CM | POA: Diagnosis not present

## 2019-09-06 DIAGNOSIS — Q253 Supravalvular aortic stenosis: Secondary | ICD-10-CM | POA: Diagnosis not present

## 2019-09-06 DIAGNOSIS — M1990 Unspecified osteoarthritis, unspecified site: Secondary | ICD-10-CM | POA: Diagnosis present

## 2019-09-06 DIAGNOSIS — H903 Sensorineural hearing loss, bilateral: Secondary | ICD-10-CM | POA: Diagnosis present

## 2019-09-06 DIAGNOSIS — R338 Other retention of urine: Secondary | ICD-10-CM | POA: Diagnosis present

## 2019-09-06 DIAGNOSIS — Z87892 Personal history of anaphylaxis: Secondary | ICD-10-CM | POA: Diagnosis not present

## 2019-09-06 DIAGNOSIS — I5033 Acute on chronic diastolic (congestive) heart failure: Secondary | ICD-10-CM

## 2019-09-06 DIAGNOSIS — I452 Bifascicular block: Secondary | ICD-10-CM | POA: Diagnosis not present

## 2019-09-06 DIAGNOSIS — Z8042 Family history of malignant neoplasm of prostate: Secondary | ICD-10-CM

## 2019-09-06 DIAGNOSIS — Z888 Allergy status to other drugs, medicaments and biological substances status: Secondary | ICD-10-CM

## 2019-09-06 DIAGNOSIS — Z85038 Personal history of other malignant neoplasm of large intestine: Secondary | ICD-10-CM

## 2019-09-06 DIAGNOSIS — M810 Age-related osteoporosis without current pathological fracture: Secondary | ICD-10-CM | POA: Diagnosis present

## 2019-09-06 DIAGNOSIS — H409 Unspecified glaucoma: Secondary | ICD-10-CM | POA: Diagnosis present

## 2019-09-06 DIAGNOSIS — N401 Enlarged prostate with lower urinary tract symptoms: Secondary | ICD-10-CM | POA: Diagnosis present

## 2019-09-06 DIAGNOSIS — M47816 Spondylosis without myelopathy or radiculopathy, lumbar region: Secondary | ICD-10-CM | POA: Diagnosis present

## 2019-09-06 DIAGNOSIS — I119 Hypertensive heart disease without heart failure: Secondary | ICD-10-CM | POA: Diagnosis present

## 2019-09-06 DIAGNOSIS — R222 Localized swelling, mass and lump, trunk: Secondary | ICD-10-CM | POA: Diagnosis not present

## 2019-09-06 DIAGNOSIS — Z952 Presence of prosthetic heart valve: Secondary | ICD-10-CM

## 2019-09-06 DIAGNOSIS — I110 Hypertensive heart disease with heart failure: Secondary | ICD-10-CM | POA: Diagnosis not present

## 2019-09-06 DIAGNOSIS — Z882 Allergy status to sulfonamides status: Secondary | ICD-10-CM

## 2019-09-06 DIAGNOSIS — I2510 Atherosclerotic heart disease of native coronary artery without angina pectoris: Secondary | ICD-10-CM | POA: Diagnosis present

## 2019-09-06 DIAGNOSIS — Z22321 Carrier or suspected carrier of Methicillin susceptible Staphylococcus aureus: Secondary | ICD-10-CM

## 2019-09-06 DIAGNOSIS — I10 Essential (primary) hypertension: Secondary | ICD-10-CM | POA: Diagnosis present

## 2019-09-06 DIAGNOSIS — I350 Nonrheumatic aortic (valve) stenosis: Principal | ICD-10-CM

## 2019-09-06 DIAGNOSIS — F419 Anxiety disorder, unspecified: Secondary | ICD-10-CM | POA: Diagnosis present

## 2019-09-06 DIAGNOSIS — C189 Malignant neoplasm of colon, unspecified: Secondary | ICD-10-CM | POA: Diagnosis present

## 2019-09-06 DIAGNOSIS — D696 Thrombocytopenia, unspecified: Secondary | ICD-10-CM

## 2019-09-06 DIAGNOSIS — E785 Hyperlipidemia, unspecified: Secondary | ICD-10-CM | POA: Diagnosis not present

## 2019-09-06 DIAGNOSIS — Z955 Presence of coronary angioplasty implant and graft: Secondary | ICD-10-CM

## 2019-09-06 DIAGNOSIS — Z856 Personal history of leukemia: Secondary | ICD-10-CM

## 2019-09-06 DIAGNOSIS — Z006 Encounter for examination for normal comparison and control in clinical research program: Secondary | ICD-10-CM

## 2019-09-06 DIAGNOSIS — Z8249 Family history of ischemic heart disease and other diseases of the circulatory system: Secondary | ICD-10-CM

## 2019-09-06 DIAGNOSIS — Z79899 Other long term (current) drug therapy: Secondary | ICD-10-CM | POA: Diagnosis not present

## 2019-09-06 DIAGNOSIS — Z87891 Personal history of nicotine dependence: Secondary | ICD-10-CM

## 2019-09-06 DIAGNOSIS — Z20822 Contact with and (suspected) exposure to covid-19: Secondary | ICD-10-CM | POA: Diagnosis present

## 2019-09-06 HISTORY — PX: TEE WITHOUT CARDIOVERSION: SHX5443

## 2019-09-06 HISTORY — DX: Presence of prosthetic heart valve: Z95.2

## 2019-09-06 HISTORY — PX: TRANSCATHETER AORTIC VALVE REPLACEMENT, TRANSFEMORAL: SHX6400

## 2019-09-06 LAB — POCT I-STAT, CHEM 8
BUN: 23 mg/dL (ref 8–23)
BUN: 22 mg/dL (ref 8–23)
BUN: 23 mg/dL (ref 8–23)
Calcium, Ion: 1.23 mmol/L (ref 1.15–1.40)
Calcium, Ion: 1.19 mmol/L (ref 1.15–1.40)
Calcium, Ion: 1.14 mmol/L — ABNORMAL LOW (ref 1.15–1.40)
Chloride: 101 mmol/L (ref 98–111)
Chloride: 101 mmol/L (ref 98–111)
Chloride: 101 mmol/L (ref 98–111)
Creatinine, Ser: 1 mg/dL (ref 0.61–1.24)
Creatinine, Ser: 1 mg/dL (ref 0.61–1.24)
Creatinine, Ser: 1 mg/dL (ref 0.61–1.24)
Glucose, Bld: 101 mg/dL — ABNORMAL HIGH (ref 70–99)
Glucose, Bld: 109 mg/dL — ABNORMAL HIGH (ref 70–99)
Glucose, Bld: 102 mg/dL — ABNORMAL HIGH (ref 70–99)
HCT: 38 % — ABNORMAL LOW (ref 39.0–52.0)
HCT: 33 % — ABNORMAL LOW (ref 39.0–52.0)
HCT: 35 % — ABNORMAL LOW (ref 39.0–52.0)
Hemoglobin: 12.9 g/dL — ABNORMAL LOW (ref 13.0–17.0)
Hemoglobin: 11.2 g/dL — ABNORMAL LOW (ref 13.0–17.0)
Hemoglobin: 11.9 g/dL — ABNORMAL LOW (ref 13.0–17.0)
Potassium: 3.5 mmol/L (ref 3.5–5.1)
Potassium: 3.4 mmol/L — ABNORMAL LOW (ref 3.5–5.1)
Potassium: 3.6 mmol/L (ref 3.5–5.1)
Sodium: 140 mmol/L (ref 135–145)
Sodium: 138 mmol/L (ref 135–145)
Sodium: 139 mmol/L (ref 135–145)
TCO2: 24 mmol/L (ref 22–32)
TCO2: 26 mmol/L (ref 22–32)
TCO2: 22 mmol/L (ref 22–32)

## 2019-09-06 LAB — POCT I-STAT 7, (LYTES, BLD GAS, ICA,H+H)
Acid-base deficit: 1 mmol/L (ref 0.0–2.0)
Bicarbonate: 25.4 mmol/L (ref 20.0–28.0)
Calcium, Ion: 1.21 mmol/L (ref 1.15–1.40)
HCT: 37 % — ABNORMAL LOW (ref 39.0–52.0)
Hemoglobin: 12.6 g/dL — ABNORMAL LOW (ref 13.0–17.0)
O2 Saturation: 100 %
Potassium: 3.5 mmol/L (ref 3.5–5.1)
Sodium: 139 mmol/L (ref 135–145)
TCO2: 27 mmol/L (ref 22–32)
pCO2 arterial: 47.5 mmHg (ref 32.0–48.0)
pH, Arterial: 7.337 — ABNORMAL LOW (ref 7.350–7.450)
pO2, Arterial: 293 mmHg — ABNORMAL HIGH (ref 83.0–108.0)

## 2019-09-06 LAB — POCT ACTIVATED CLOTTING TIME
Activated Clotting Time: 120 seconds
Activated Clotting Time: 224 seconds
Activated Clotting Time: 98 seconds

## 2019-09-06 SURGERY — IMPLANTATION, AORTIC VALVE, TRANSCATHETER, FEMORAL APPROACH
Anesthesia: Monitor Anesthesia Care | Site: Chest

## 2019-09-06 MED ORDER — OXYCODONE HCL 5 MG PO TABS: 5.0000 mg | ORAL_TABLET | ORAL | Status: DC | PRN

## 2019-09-06 MED ORDER — CHLORHEXIDINE GLUCONATE CLOTH 2 % EX PADS
6.0000 | MEDICATED_PAD | Freq: Every day | CUTANEOUS | Status: DC
  Administered 2019-09-06 – 2019-09-08 (×3): 6 via TOPICAL

## 2019-09-06 MED ORDER — ACETAMINOPHEN 325 MG PO TABS
650.0000 mg | ORAL_TABLET | Freq: Four times a day (QID) | ORAL | Status: DC | PRN
  Administered 2019-09-07: 650 mg via ORAL
  Filled 2019-09-06: qty 2

## 2019-09-06 MED ORDER — SERTRALINE HCL 50 MG PO TABS
50.0000 mg | ORAL_TABLET | Freq: Every evening | ORAL | Status: DC
  Administered 2019-09-06 – 2019-09-08 (×3): 50 mg via ORAL
  Filled 2019-09-06 (×3): qty 1

## 2019-09-06 MED ORDER — ACETAMINOPHEN 650 MG RE SUPP: 650.0000 mg | Freq: Four times a day (QID) | RECTAL | Status: DC | PRN

## 2019-09-06 MED ORDER — NITROGLYCERIN IN D5W 200-5 MCG/ML-% IV SOLN
0.0000 ug/min | INTRAVENOUS | Status: DC
  Administered 2019-09-06: 10 ug/min via INTRAVENOUS

## 2019-09-06 MED ORDER — HEPARIN (PORCINE) IN NACL 1000-0.9 UT/500ML-% IV SOLN
INTRAVENOUS | Status: AC
  Filled 2019-09-06: qty 1500

## 2019-09-06 MED ORDER — PHENYLEPHRINE HCL-NACL 20-0.9 MG/250ML-% IV SOLN
0.0000 ug/min | INTRAVENOUS | Status: DC
  Administered 2019-09-07: 20 ug/min via INTRAVENOUS
  Filled 2019-09-06 (×2): qty 250

## 2019-09-06 MED ORDER — ACETAMINOPHEN 500 MG PO TABS
1000.0000 mg | ORAL_TABLET | Freq: Once | ORAL | Status: AC
  Filled 2019-09-06: qty 2

## 2019-09-06 MED ORDER — LIDOCAINE HCL (PF) 1 % IJ SOLN
INTRAMUSCULAR | Status: AC
  Filled 2019-09-06: qty 30

## 2019-09-06 MED ORDER — LACTATED RINGERS IV SOLN: INTRAVENOUS | Status: DC

## 2019-09-06 MED ORDER — HEPARIN (PORCINE) IN NACL 1000-0.9 UT/500ML-% IV SOLN
INTRAVENOUS | Status: DC | PRN
  Administered 2019-09-06 (×3): 500 mL

## 2019-09-06 MED ORDER — SODIUM CHLORIDE 0.9 % IV SOLN
1.5000 g | Freq: Two times a day (BID) | INTRAVENOUS | Status: AC
  Administered 2019-09-06 – 2019-09-08 (×4): 1.5 g via INTRAVENOUS
  Filled 2019-09-06 (×4): qty 1.5

## 2019-09-06 MED ORDER — PANTOPRAZOLE SODIUM 40 MG PO TBEC
40.0000 mg | DELAYED_RELEASE_TABLET | Freq: Every day | ORAL | Status: DC
  Administered 2019-09-06 – 2019-09-09 (×4): 40 mg via ORAL
  Filled 2019-09-06 (×4): qty 1

## 2019-09-06 MED ORDER — LACTATED RINGERS IV SOLN: INTRAVENOUS | Status: DC | PRN

## 2019-09-06 MED ORDER — ONDANSETRON HCL 4 MG/2ML IJ SOLN
INTRAMUSCULAR | Status: DC | PRN
  Administered 2019-09-06: 4 mg via INTRAVENOUS

## 2019-09-06 MED ORDER — TRAMADOL HCL 50 MG PO TABS: 50.0000 mg | ORAL_TABLET | ORAL | Status: DC | PRN

## 2019-09-06 MED ORDER — IOHEXOL 350 MG/ML SOLN
INTRAVENOUS | Status: DC | PRN
  Administered 2019-09-06: 40 mL via INTRA_ARTERIAL

## 2019-09-06 MED ORDER — SODIUM CHLORIDE 0.9 % IV SOLN: INTRAVENOUS | Status: DC | PRN

## 2019-09-06 MED ORDER — PREDNISONE 20 MG PO TABS
40.0000 mg | ORAL_TABLET | Freq: Every day | ORAL | Status: DC
  Administered 2019-09-07 – 2019-09-09 (×3): 40 mg via ORAL
  Filled 2019-09-06 (×3): qty 2

## 2019-09-06 MED ORDER — NITROGLYCERIN IN D5W 200-5 MCG/ML-% IV SOLN
INTRAVENOUS | Status: AC
  Filled 2019-09-06: qty 250

## 2019-09-06 MED ORDER — CHLORHEXIDINE GLUCONATE 4 % EX LIQD: 30.0000 mL | CUTANEOUS | Status: DC

## 2019-09-06 MED ORDER — SODIUM CHLORIDE 0.9% FLUSH: 3.0000 mL | INTRAVENOUS | Status: DC | PRN

## 2019-09-06 MED ORDER — SODIUM CHLORIDE 0.9% FLUSH
3.0000 mL | Freq: Two times a day (BID) | INTRAVENOUS | Status: DC
  Administered 2019-09-06 – 2019-09-08 (×5): 3 mL via INTRAVENOUS

## 2019-09-06 MED ORDER — ONDANSETRON HCL 4 MG/2ML IJ SOLN: 4.0000 mg | Freq: Four times a day (QID) | INTRAMUSCULAR | Status: DC | PRN

## 2019-09-06 MED ORDER — VANCOMYCIN HCL IN DEXTROSE 1-5 GM/200ML-% IV SOLN
1000.0000 mg | Freq: Once | INTRAVENOUS | Status: AC
  Administered 2019-09-06: 1000 mg via INTRAVENOUS
  Filled 2019-09-06: qty 200

## 2019-09-06 MED ORDER — PROTAMINE SULFATE 10 MG/ML IV SOLN
INTRAVENOUS | Status: DC | PRN
  Administered 2019-09-06: 10 mg via INTRAVENOUS
  Administered 2019-09-06: 110 mg via INTRAVENOUS

## 2019-09-06 MED ORDER — IOHEXOL 350 MG/ML SOLN
INTRAVENOUS | Status: AC
  Filled 2019-09-06: qty 1

## 2019-09-06 MED ORDER — SODIUM CHLORIDE 0.9 % IV SOLN
250.0000 mL | INTRAVENOUS | Status: DC | PRN
  Administered 2019-09-07: 250 mL via INTRAVENOUS

## 2019-09-06 MED ORDER — POTASSIUM CHLORIDE CRYS ER 20 MEQ PO TBCR
20.0000 meq | EXTENDED_RELEASE_TABLET | Freq: Once | ORAL | Status: AC
  Administered 2019-09-06: 20 meq via ORAL
  Filled 2019-09-06: qty 1

## 2019-09-06 MED ORDER — ACETAMINOPHEN 500 MG PO TABS
ORAL_TABLET | ORAL | Status: AC
  Administered 2019-09-06: 1000 mg via ORAL
  Filled 2019-09-06: qty 2

## 2019-09-06 MED ORDER — ASPIRIN 81 MG PO CHEW
81.0000 mg | CHEWABLE_TABLET | Freq: Every day | ORAL | Status: DC
  Administered 2019-09-07 – 2019-09-09 (×3): 81 mg via ORAL
  Filled 2019-09-06 (×3): qty 1

## 2019-09-06 MED ORDER — HEPARIN SODIUM (PORCINE) 1000 UNIT/ML IJ SOLN
INTRAMUSCULAR | Status: DC | PRN
Start: 2019-09-06 — End: 2019-09-06
  Administered 2019-09-06: 12000 [IU] via INTRAVENOUS

## 2019-09-06 MED ORDER — CHLORHEXIDINE GLUCONATE 0.12 % MT SOLN
15.0000 mL | Freq: Once | OROMUCOSAL | Status: AC
  Administered 2019-09-06: 15 mL via OROMUCOSAL
  Filled 2019-09-06: qty 15

## 2019-09-06 MED ORDER — CLEVIDIPINE BUTYRATE 0.5 MG/ML IV EMUL
INTRAVENOUS | Status: AC
  Filled 2019-09-06: qty 50

## 2019-09-06 MED ORDER — LIDOCAINE HCL 1 % IJ SOLN
INTRAMUSCULAR | Status: DC | PRN
  Administered 2019-09-06: 25 mL

## 2019-09-06 MED ORDER — TAMSULOSIN HCL 0.4 MG PO CAPS
0.8000 mg | ORAL_CAPSULE | Freq: Every day | ORAL | Status: DC
  Administered 2019-09-06 – 2019-09-08 (×3): 0.8 mg via ORAL
  Filled 2019-09-06 (×4): qty 2

## 2019-09-06 MED ORDER — SODIUM CHLORIDE 0.9 % IV SOLN: INTRAVENOUS | Status: AC

## 2019-09-06 MED ORDER — CLEVIDIPINE BUTYRATE 0.5 MG/ML IV EMUL
INTRAVENOUS | Status: DC | PRN
  Administered 2019-09-06: 2 mg/h via INTRAVENOUS

## 2019-09-06 MED ORDER — SODIUM CHLORIDE 0.9 % IV SOLN: INTRAVENOUS | Status: DC

## 2019-09-06 MED ORDER — ATORVASTATIN CALCIUM 10 MG PO TABS
20.0000 mg | ORAL_TABLET | Freq: Every day | ORAL | Status: DC
  Administered 2019-09-07 – 2019-09-08 (×2): 20 mg via ORAL
  Filled 2019-09-06 (×3): qty 2

## 2019-09-06 SURGICAL SUPPLY — 29 items
BLANKET WARM UNDERBOD FULL ACC (MISCELLANEOUS) ×1 IMPLANT
CABLE ADAPT PACING TEMP 12FT (ADAPTER) ×1 IMPLANT
CATH 26 ULTRA DELIVERY (CATHETERS) ×1 IMPLANT
CATH DIAG 6FR PIGTAIL ANGLED (CATHETERS) ×2 IMPLANT
CATH INFINITI 6F AL2 (CATHETERS) ×1 IMPLANT
CATH TEMPO TEMP PACE LEAD (CATHETERS) IMPLANT
CATHETER TEMPO TEMP PACE LEAD (CATHETERS) ×3
CLOSURE MYNX CONTROL 6F/7F (Vascular Products) ×1 IMPLANT
COVER DOME SNAP 22 D (MISCELLANEOUS) ×1 IMPLANT
CRIMPER (MISCELLANEOUS) ×1 IMPLANT
DEVICE CLOSURE PERCLS PRGLD 6F (VASCULAR PRODUCTS) IMPLANT
DEVICE INFLATION ATRION QL2530 (MISCELLANEOUS) ×1 IMPLANT
GUIDEWIRE SAFE TJ AMPLATZ EXST (WIRE) ×1 IMPLANT
KIT MICROPUNCTURE NIT STIFF (SHEATH) ×1 IMPLANT
PERCLOSE PROGLIDE 6F (VASCULAR PRODUCTS) ×6
SHEATH 14X36 EDWARDS (SHEATH) ×1 IMPLANT
SHEATH PINNACLE 6F 10CM (SHEATH) ×1 IMPLANT
SHEATH PINNACLE 7F 10CM (SHEATH) ×1 IMPLANT
SHEATH PINNACLE 8F 10CM (SHEATH) ×1 IMPLANT
SHEATH PROBE COVER 6X72 (BAG) ×1 IMPLANT
SHIELD RADPAD SCOOP 12X17 (MISCELLANEOUS) ×1 IMPLANT
SLEEVE REPOSITIONING LENGTH 30 (MISCELLANEOUS) ×1 IMPLANT
STOPCOCK MORSE 400PSI 3WAY (MISCELLANEOUS) ×2 IMPLANT
SYR MEDRAD MARK 7 150ML (SYRINGE) ×1 IMPLANT
TUBE CONN 8.8X1320 FR HP M-F (CONNECTOR) ×1 IMPLANT
VALVE 26 ULTRA SAPIEN KIT (Valve) ×1 IMPLANT
WIRE AMPLATZ SS-J .035X180CM (WIRE) ×1 IMPLANT
WIRE EMERALD 3MM-J .035X150CM (WIRE) ×1 IMPLANT
WIRE EMERALD ST .035X260CM (WIRE) ×1 IMPLANT

## 2019-09-06 NOTE — Progress Notes (Signed)
Patient ID: [name], male   DOB: 1923/11/27, 86 y.o.   MRN: [id number] TCTS Evening Rounds:  Hemodynamically stable  Rhythm is sinus brady 48. Temp pacer on VVI 40  Awake and alert. No complaints. Groin sites ok.  K+ 3.4 this afternoon. Will replace.

## 2019-09-06 NOTE — Transfer of Care (Signed)
Immediate Anesthesia Transfer of Care Note  Patient: [name]  Procedure(s) Performed: TRANSCATHETER AORTIC VALVE REPLACEMENT, TRANSFEMORAL (N/A Chest) TRANSESOPHAGEAL ECHOCARDIOGRAM (TEE) (N/A )  Patient Location: PACU and Cath Lab  Anesthesia Type:MAC  Level of Consciousness: awake, alert  and oriented  Airway & Oxygen Therapy: Patient Spontanous Breathing  Post-op Assessment: Report given to RN and Post -op Vital signs reviewed and stable  Post vital signs: Reviewed and stable  Last Vitals:  Vitals Value Taken Time  BP    Temp    Pulse 48 09/06/19 1423  Resp 12 09/06/19 1423  SpO2 95 % 09/06/19 1423  Vitals shown include unvalidated device data.  Last Pain:  Vitals:   09/06/19 0918  TempSrc:   PainSc: 0-No pain         Complications: No complications documented.

## 2019-09-06 NOTE — Op Note (Signed)
[facility]   MULTIDISCIPLINARY HEART VALVE TEAM   TAVR OPERATIVE NOTE   Date of Procedure:  09/06/2019  Preoperative Diagnosis: Severe Aortic Stenosis   Postoperative Diagnosis: Same   Procedure:    Transcatheter Aortic Valve Replacement - Percutaneous Right Transfemoral Approach  Edwards Sapien 3 Ultra THV (size 26 mm, model # 9750TFX, serial # [id number])   Co-Surgeons:  Gaye Pollack, MD  /  Lauree Chandler, MD   Anesthesiologist:  R. Ola Spurr, MD  Echocardiographer:  Liane Comber, MD  Pre-operative Echo Findings:  Severe aortic stenosis  Normal left ventricular systolic function  Post-operative Echo Findings:  No paravalvular leak  Normal left ventricular systolic function   BRIEF CLINICAL NOTE AND INDICATIONS FOR SURGERY  This 86 year old gentleman has stage D, severe, symptomatic aortic stenosis with New York Heart Association class II symptoms of exertional fatigue and shortness of breath consistent with chronic diastolic congestive heart failure. He feels like his symptoms are significantly limiting his activity level. I have personally reviewed his 2D echocardiogram, cardiac catheterization, and CTA studies. His echocardiogram shows a thickened and calcified aortic valve with restricted mobility. The mean gradient is 43 mmHg with a dimensionless index of 0.24 and a valve area of 0.76 cm consistent with severe aortic stenosis. There is moderate aortic insufficiency with a pressure half-time of 509 ms. Left ventricular systolic function is normal. Cardiac catheterization showed a peak to peak gradient of 48 mmHg. 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 There is 60 to 70% first diagonal stenosis. A third diagonal branch has 90% stenosis. The mid RCA above a previously stented distal segment contains about 50 to 60% narrowing. The left circumflex is  small without any significant disease. I agree that aortic valve replacement is indicated in this 86 year old fairly active gentleman with lifestyle limiting severe aortic stenosis. I think transcatheter aortic valve replacement would be the best option for him. His gated cardiac CTA shows anatomy suitable for transcatheter aortic valve replacement using a SAPIEN 3 valve. Abdominal and pelvic CTA shows adequate pelvic vascular anatomy to allow transfemoral insertion.   His CTA of the chest also shows a 3.6 x 3.0 cm solid mass in the left upper lobe of the lung which is smoothly marginated and has a benign appearance. Review of his prior CT scan from 03/29/2013 showed that it measured 2.6 x 2.5 cm at that time. Given the appearance and slow increase in size over the past 6 years I think this is certainly a benign lesion such as a hamartoma. He said that this has been followed for many years at [facility] and he was told that nothing need to be done about it. I would agree with that.   The patient and his wife were counseled at length regarding treatment alternatives for management of severe symptomatic aortic stenosis. The risks and benefits of surgical intervention has been discussed in detail. Long-term prognosis with medical therapy was discussed. Alternative approaches such as conventional surgical aortic valve replacement, transcatheter aortic valve replacement, and palliative medical therapy were compared and contrasted at length. This discussion was placed in the context of the patient's own specific clinical presentation and past medical history. All of their questions have been addressed.   Following the decision to proceed with transcatheter aortic valve replacement, a discussion was held regarding what types of management strategies would be attempted intraoperatively in the event of life-threatening  complications, including whether or not the patient would be considered a candidate for the use of  cardiopulmonary bypass and/or conversion to open sternotomy for attempted surgical intervention. The patient is aware of the fact that transient use of cardiopulmonary bypass may be necessary. Given his age and 53 years I do not think he would be a candidate for emergent sternotomy to manage any intraoperative complications. The patient has been advised of a variety of complications that might develop including but not limited to risks of death, stroke, paravalvular leak, aortic dissection or other major vascular complications, aortic annulus rupture, device embolization, cardiac rupture or perforation, mitral regurgitation, acute myocardial infarction, arrhythmia, heart block or bradycardia requiring permanent pacemaker placement, congestive heart failure, respiratory failure, renal failure, pneumonia, infection, other late complications related to structural valve deterioration or migration, or other complications that might ultimately cause a temporary or permanent loss of functional independence or other long term morbidity. The patient provides full informed consent for the procedure as described and all questions were answered.    DETAILS OF THE OPERATIVE PROCEDURE  PREPARATION:    The patient was brought to the operating room on the above mentioned date and appropriate monitoring was established by the anesthesia team. The patient was placed in the supine position on the operating table.  Intravenous antibiotics were administered. The patient was monitored closely throughout the procedure under conscious sedation.  General endotracheal anesthesia was induced uneventfully.   Baseline transthoracic echocardiogram was performed. The patient's abdomen and both groins were prepped and draped in a sterile manner. A time out procedure was performed.   PERIPHERAL ACCESS:    The right neck was prepped and draped sterilely. The right IJ vein was cannulated with a needle under ultrasound guidance and a  guidewire advanced into the right heart. A 7 Fr sheath was inserted and a Tempo pacemaker wire was advanced into the RV with acceptable pacing threshold. Using the modified Seldinger technique, femoral arterial access was obtained with placement of 6 Fr sheaths on the left side.  A pigtail diagnostic catheter was passed through the left arterial sheath under fluoroscopic guidance into the aortic root.  Aortic root angiography was performed in order to determine the optimal angiographic angle for valve deployment.   TRANSFEMORAL ACCESS:   Percutaneous transfemoral access and sheath placement was performed using ultrasound guidance.  The right common femoral artery was cannulated using a micropuncture needle and appropriate location was verified using hand injection angiogram.  A pair of Abbott Perclose percutaneous closure devices were placed and a 6 French sheath replaced into the femoral artery.  The patient was heparinized systemically and ACT verified > 250 seconds.    A 14 Fr transfemoral E-sheath was introduced into the right common femoral artery after progressively dilating over an Amplatz superstiff wire. An AL-2 catheter was used to direct a straight-tip exchange length wire across the native aortic valve into the left ventricle. This was exchanged out for a pigtail catheter and position was confirmed in the LV apex. Simultaneous LV and Ao pressures were recorded.  The pigtail catheter was exchanged for an Amplatz Extra-stiff wire in the LV apex.     BALLOON AORTIC VALVULOPLASTY:   Not performed   TRANSCATHETER HEART VALVE DEPLOYMENT:   An Edwards Sapien 3 Ultra transcatheter heart valve (size 26 mm, model #[id number], serial #[id number]) was prepared and crimped per manufacturer's guidelines, and the proper orientation of the valve is confirmed on the [employer] delivery system. The valve was advanced through  the introducer sheath using normal technique until in an appropriate position  in the abdominal aorta beyond the sheath tip. The balloon was then retracted and using the fine-tuning wheel was centered on the valve. The valve was then advanced across the aortic arch using appropriate flexion of the catheter. The valve was carefully positioned across the aortic valve annulus. The Commander catheter was retracted using normal technique. Once final position of the valve has been confirmed by angiographic assessment, the valve is deployed while temporarily holding ventilation and during rapid ventricular pacing to maintain systolic blood pressure < 50 mmHg and pulse pressure < 10 mmHg. The balloon inflation is held for >3 seconds after reaching full deployment volume. Once the balloon has fully deflated the balloon is retracted into the ascending aorta and valve function is assessed using echocardiography. There is felt to be no paravalvular leak and no central aortic insufficiency.  The patient's hemodynamic recovery following valve deployment is good.  The deployment balloon and guidewire are both removed.    PROCEDURE COMPLETION:   The sheath was removed and femoral artery closure performed.  Protamine was administered once femoral arterial repair was complete. The pigtail catheter and femoral sheath were removed with a Mynx femoral closure device utilized following removal of the diagnostic sheath in the left femoral artery.  The patient tolerated the procedure well and is transported to the cath lab recovery area in stable condition. There were no immediate intraoperative complications. All sponge instrument and needle counts are verified correct at completion of the operation.   No blood products were administered during the operation.  The patient received a total of 40 mL of intravenous contrast during the procedure.   Gaye Pollack, MD 09/06/2019 5:01 PM

## 2019-09-06 NOTE — Anesthesia Procedure Notes (Signed)
Procedure Name: MAC Date/Time: 09/06/2019 12:30 PM Performed by: Griffin Dakin, CRNA Pre-anesthesia Checklist: Patient identified, Emergency Drugs available, Suction available and Patient being monitored Patient Re-evaluated:Patient Re-evaluated prior to induction Induction Type: IV induction Number of attempts: 1 Placement Confirmation: positive ETCO2 and breath sounds checked- equal and bilateral Dental Injury: Teeth and Oropharynx as per pre-operative assessment

## 2019-09-06 NOTE — Progress Notes (Signed)
Echocardiogram 2D Echocardiogram has been performed.  Oneal Deputy Cade Olberding 09/06/2019, 2:08 PM

## 2019-09-06 NOTE — Interval H&P Note (Signed)
History and Physical Interval Note:  09/06/2019 11:00 AM  [name]  has presented today for surgery, with the diagnosis of Severe Aortic Stenosis.  The various methods of treatment have been discussed with the patient and family. After consideration of risks, benefits and other options for treatment, the patient has consented to  Procedure(s): TRANSCATHETER AORTIC VALVE REPLACEMENT, TRANSFEMORAL (N/A) TRANSESOPHAGEAL ECHOCARDIOGRAM (TEE) (N/A) as a surgical intervention.  The patient's history has been reviewed, patient examined, no change in status, stable for surgery.  I have reviewed the patient's chart and labs.  Questions were answered to the patient's satisfaction.     Gaye Pollack

## 2019-09-06 NOTE — H&P (Signed)
[address]       [place] [address]             [phone]      Cardiothoracic Surgery Admission History and Physical   Referring Provider is Belva Crome, MD  Primary Cardiologist is Sinclair Grooms, MD  PCP is [name], MD      Chief Complaint  Patient presents with  . Aortic Stenosis       HPI:  The patient is a 86 year old gentleman with history of hypertension, dyslipidemia, coronary artery disease status post DES x2 to the mid to distal RCA in January 2016, remote hairy cell leukemia, remote colon cancer, idiopathic thrombocytopenia currently managed by Dr. Julieanne Manson, anemia, and aortic stenosis who was referred for consideration of TAVR. He has been followed by Dr. Tamala Julian with what is felt to be a moderate aortic stenosis. An echocardiogram in October 2020 showed a mean gradient of 32 mmHg with a peak gradient of 55 mmHg and a valve area of 0.85 cm. Left ventricular ejection fraction was 60 to 65%. He now presents with progressive exertional fatigue and shortness of breath which he says has been going on for several years but recently getting worse to the point where he gets fatigue and shortness of breath with going up stairs or an incline. He has had generalized weakness in his legs with any exercise. He denies any peripheral edema. He has had no dizziness or syncope. He has not had any chest pain. He had a repeat echocardiogram on 06/13/2019 which showed further progression of his aortic stenosis with a mean gradient of 43 mmHg and a peak gradient of 67 mmHg. Aortic valve area by VTI was 0.76 cm with a dimensionless index of 0.24 consistent with severe aortic stenosis. Left ventricular ejection fraction remained 60 to 65% with grade 1 diastolic dysfunction.  The patient lives with his wife. Despite being 43 years old he has tried to remain very active and would like to continue being active.      Past Medical History:  Diagnosis Date  .  Acute appendicitis   . Anemia   . Anxiety   . Arthritis    "back" (03/14/2014)  . CAD (coronary artery disease)    a. 03/14/14 s/p overlapping DES x2 to mid-distal RCA.  . 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 Colon cancer ([facility]) 1984  . Compression fracture of lumbar spine, non-traumatic ([facility])   . DJD (degenerative joint disease) of lumbar spine   . Dyslipidemia   . Elevated PSA   . GERD (gastroesophageal reflux disease)   . Glaucoma   . Hairy cell leukemia ([facility]) dx'd 1980  . History of blood transfusion    "several; related to hairy cell leukemia & tx "  . History of stomach ulcers 1968  . Hypertension   . Malnutrition ([facility])   . Osteoarthritis   . Osteoporosis   . Severe aortic stenosis   . Skin cancer of face         Past Surgical History:  Procedure Laterality Date  . APPENDECTOMY  10/2007  . CATARACT EXTRACTION, BILATERAL Bilateral 02/2008  . COLON SURGERY  1984   "sigmoid; open"  . CORONARY ANGIOPLASTY WITH STENT PLACEMENT  03/14/2014   "2"  . FRACTURE SURGERY    . LEFT HEART CATHETERIZATION WITH CORONARY ANGIOGRAM N/A 03/14/2014   Procedure: LEFT HEART CATHETERIZATION WITH CORONARY ANGIOGRAM; Surgeon: Peter M Martinique, MD; Location:  [facility]; Service: Cardiovascular; Laterality: N/A;  . LIPOMA EXCISION Right 07/2011   liposarcoma resection; "back"  . MOHS SURGERY Left 02/2011  . MOHS SURGERY  X 3   "all on my face"  . MOLE REMOVAL Left 1985   cheek  . ORIF ANKLE FRACTURE Left 2000  . RIGHT/LEFT HEART CATH AND CORONARY ANGIOGRAPHY N/A 07/26/2019   Procedure: RIGHT/LEFT HEART CATH AND CORONARY ANGIOGRAPHY; Surgeon: Belva Crome, MD; Location: [facility]; Service: Cardiovascular; Laterality: N/A;  . SHOULDER SURGERY  04/2004  . SPLENECTOMY  1992  . TONSILLECTOMY AND ADENOIDECTOMY  1939        Family History  Problem Relation Age of Onset  . Congestive Heart Failure Father 108  . Stroke Mother   . Diabetes Mellitus II Brother   .  Prostate cancer Brother   . Heart Problems Brother    CABG   Social History        Socioeconomic History  . Marital status: Married    Spouse name: Not on file  . Number of children: Not on file  . Years of education: Not on file  . Highest education level: Not on file  Occupational History  . Not on file  Tobacco Use  . Smoking status: Former Smoker    Packs/day: 0.25    Years: 1.00    Pack years: 0.25    Types: Cigarettes, Cigars  . Smokeless tobacco: Never Used  . Tobacco comment: occasional social smoker during college.  Substance and Sexual Activity  . Alcohol use: Yes    Alcohol/week: 9.0 standard drinks    Types: 2 Glasses of wine, 2 Shots of liquor, 5 Standard drinks or equivalent per week  . Drug use: No  . Sexual activity: Never  Other Topics Concern  . Not on file  Social History Narrative  . Not on file   Social Determinants of Health      Financial Resource Strain:   . Difficulty of Paying Living Expenses:   Food Insecurity:   . Worried About Charity fundraiser in the Last Year:   . Arboriculturist in the Last Year:   Transportation Needs:   . Film/video editor (Medical):   Marland Kitchen Lack of Transportation (Non-Medical):   Physical Activity:   . Days of Exercise per Week:   . Minutes of Exercise per Session:   Stress:   . Feeling of Stress :   Social Connections:   . Frequency of Communication with Friends and Family:   . Frequency of Social Gatherings with Friends and Family:   . Attends Religious Services:   . Active Member of Clubs or Organizations:   . Attends Archivist Meetings:   Marland Kitchen Marital Status:   Intimate Partner Violence:   . Fear of Current or Ex-Partner:   . Emotionally Abused:   Marland Kitchen Physically Abused:   . Sexually Abused:          Current Outpatient Medications  Medication Sig Dispense Refill  . atorvastatin (LIPITOR) 20 MG tablet TAKE 1 TABLET BY MOUTH DAILY AT 6 PM (Patient taking differently: Take 20 mg by mouth  daily at 6 PM. ) 90 tablet 3  . Cholecalciferol (VITAMIN D-3 PO) Take 2,000 Units by mouth at bedtime.     Marland Kitchen desonide (DESOWEN) 0.05 % cream Apply 1 application topically 2 (two) times daily as needed (irritation).     . fluocinonide (LIDEX) 0.05 % external solution Apply 1 application topically  2 (two) times daily as needed (apply to ears).   11  . latanoprost (XALATAN) 0.005 % ophthalmic solution Place 1 drop into both eyes at bedtime.     . metoprolol tartrate (LOPRESSOR) 25 MG tablet TAKE ONE-HALF TABLET BY MOUTH DAILY PLEASE KEEP YOUR UPCOMING APPOINTMENT WITH DOCTOR SMITH IN [place] FOR FUTURE REFILLS. THANK YOU (Patient taking differently: Take 12.5 mg by mouth daily. ) 45 tablet 5  . Multiple Vitamins-Minerals (PRESERVISION AREDS 2 PO) Take 1 capsule by mouth 2 (two) times daily.     . nitroGLYCERIN (NITROSTAT) 0.4 MG SL tablet Place 1 tablet (0.4 mg total) under the tongue every 5 (five) minutes as needed for chest pain. Please make yearly appt with Dr. Tamala Julian for September. 1st attempt 25 tablet 2  . pantoprazole (PROTONIX) 40 MG tablet Take 40 mg by mouth daily.    . predniSONE (DELTASONE) 10 MG tablet Take 4 tablets (40 mg total) by mouth daily with breakfast. (Patient taking differently: Take 30 mg by mouth daily with breakfast. ) 112 tablet 0  . ranibizumab (LUCENTIS) 0.5 MG/0.05ML SOLN 0.5 mg by Intravitreal route every 28 (twenty-eight) days.     Marland Kitchen sertraline (ZOLOFT) 50 MG tablet Take 50 mg by mouth every evening.     . tamsulosin (FLOMAX) 0.4 MG CAPS capsule Take 0.8 mg by mouth daily after supper.     . timolol (BETIMOL) 0.5 % ophthalmic solution Place 1 drop into the right eye daily.     Marland Kitchen triamcinolone cream (KENALOG) 0.1 % Apply 1 application topically 2 (two) times daily as needed (irritation).     No current facility-administered medications for this visit.        Allergies  Allergen Reactions  . Antazoline Anaphylaxis  . Antihistamines, Chlorpheniramine-Type      Nervous, jittery  . Sulfa Antibiotics Rash   Review of Systems:   General: normal appetite, + decreased energy, no weight gain, no weight loss, no fever  Cardiac: no chest pain with exertion, no chest pain at rest, +SOB with exertion, no resting SOB, no PND, no orthopnea, no palpitations, no arrhythmia, no atrial fibrillation, no LE edema, no dizzy spells, no syncope  Respiratory: + exertional shortness of breath, no home oxygen, no productive cough, no dry cough, no bronchitis, no wheezing, no hemoptysis, no asthma, no pain with inspiration or cough, no sleep apnea, no CPAP at night  GI: no difficulty swallowing, no reflux, no frequent heartburn, no hiatal hernia, no abdominal pain, no constipation, no diarrhea, no hematochezia, no hematemesis, no melena  GU: no dysuria, + frequency, no urinary tract infection, no hematuria, + enlarged prostate, no kidney stones, no kidney disease  Vascular: no pain suggestive of claudication, no pain in feet, no leg cramps, no varicose veins, no DVT, no non-healing foot ulcer  Neuro: no stroke, no TIA's, no seizures, no headaches, no temporary blindness one eye, no slurred speech, no peripheral neuropathy, no chronic pain, no instability of gait, no memory/cognitive dysfunction  Musculoskeletal: + arthritis, no joint swelling, no myalgias, no difficulty walking, normal mobility  Skin: + rash, no itching, no skin infections, no pressure sores or ulcerations  Psych: no anxiety, no depression, no nervousness, no unusual recent stress  Eyes: no blurry vision, no floaters, no recent vision changes, + wears glasses or contacts  ENT: + hearing loss, no loose or painful teeth, no dentures, last saw dentist within past 30 days.  Hematologic: + easy bruising, no abnormal bleeding, no clotting disorder, no frequent epistaxis  Endocrine: no diabetes, does not check CBG's at home    Physical Exam:   BP 134/68  Pulse 63  Temp 97.9 F (36.6 C) (Skin)  Resp 20  Ht 5'  4" (1.626 m)  Wt 169 lb (76.7 kg)  SpO2 95% Comment: RA  BMI 29.01 kg/m  General: Elderly but well-appearing  HEENT: Unremarkable, NCAT, PERLA, EOMI  Neck: no JVD, no bruits, no adenopathy  Chest: clear to auscultation, symmetrical breath sounds, no wheezes, no rhonchi  CV: RRR, grade lll/VI crescendo/decrescendo murmur heard best at RSB, no diastolic murmur  Abdomen: soft, non-tender, no masses  Extremities: warm, well-perfused, pulses palpable at ankle, mild bilateral LE edema  Rectal/GU Deferred  Neuro: Grossly non-focal and symmetrical throughout  Skin: Clean and dry, no rashes, no breakdown    Diagnostic Tests:   ECHOCARDIOGRAM REPORT     Patient Name: TYLER CUBIT Date of Exam: 06/13/2019  Medical Rec #: [id number] Height: 64.0 in  Accession #: [id number] Weight: 164.0 lb  Date of Birth: September 10, 1923 BSA: 1.798 m  Patient Age: 62 years BP: 118/66 mmHg  Patient Gender: M HR: 56 bpm.  Exam Location: [address]   Procedure: 2D Echo, 3D Echo, Cardiac Doppler, Color Doppler and Strain  Analysis   Indications: I35.9 Aortic valve disease   History: Patient has prior history of Echocardiogram examinations,  most  recent 12/27/2018. CAD, Aortic Valve Disease,  Signs/Symptoms:Dyspnea; Risk Factors:Hypertension,  Dyslipidemia  and Former Smoker. Colon cancer.   Sonographer: Jessee Avers, RDCS  Referring Phys: [place]    1. Left ventricular ejection fraction, by estimation, is 60 to 65%. The  left ventricle has normal function. The left ventricle has no regional  wall motion abnormalities. Left ventricular diastolic parameters are  consistent with Grade I diastolic  dysfunction (impaired relaxation). Elevated left atrial pressure.  2. Right ventricular systolic function is normal. The right ventricular  size is normal. There is mildly elevated pulmonary artery systolic  pressure.  3. Left atrial size was mildly dilated.  4. The mitral  valve is abnormal. Mild mitral valve regurgitation.  5. AV is thickened, calcified with restricted motion. Peak and mean  gradients through the valve are 67 and 43 mm Hg respectively. Thisk in  increased from echo report of 2020. LVOT/AV VTI ratio is 024 consistent  with severe AS.Marland Kitchen The aortic valve is  tricuspid. Aortic valve regurgitation is moderate.  6. Aortic dilatation noted. There is mild dilatation of the ascending  aorta measuring 39 mm.   Comparison(s): 12/27/18 EF 60-65%. Moderate AS 11mmHg mean PG.   FINDINGS  Left Ventricle: Left ventricular ejection fraction, by estimation, is 60  to 65%. The left ventricle has normal function. The left ventricle has no  regional wall motion abnormalities. The left ventricular internal cavity  size was normal in size. There is  no left ventricular hypertrophy. Left ventricular diastolic parameters  are consistent with Grade I diastolic dysfunction (impaired relaxation).  Elevated left atrial pressure.   Right Ventricle: The right ventricular size is normal. No increase in  right ventricular wall thickness. Right ventricular systolic function is  normal. There is mildly elevated pulmonary artery systolic pressure. The  tricuspid regurgitant velocity is 2.74  m/s, and with an assumed right atrial pressure of 8 mmHg, the estimated  right ventricular systolic pressure is 44.8 mmHg.   Left Atrium: Left atrial size was mildly dilated.   Right Atrium: Right atrial size was normal in size.  Pericardium: Trivial pericardial effusion is present.   Mitral Valve: The mitral valve is abnormal. There is mild thickening of  the mitral valve leaflet(s). Mild mitral valve regurgitation.   Tricuspid Valve: The tricuspid valve is normal in structure. Tricuspid  valve regurgitation is mild.   Aortic Valve: AV is thickened, calcified with restricted motion. Peak and  mean gradients through the valve are 67 and 43 mm Hg respectively. This id    increased from echo report of 2020. LVOT/AV VTI ratio is 0.24 consistent  with severe AS. The aortic valve  is tricuspid. Aortic valve regurgitation is moderate. Aortic  regurgitation PHT measures 509 msec. Aortic valve area, by VTI measures  0.76 cm.   Pulmonic Valve: The pulmonic valve was normal in structure. Pulmonic valve  regurgitation is trivial.   Aorta: Aortic dilatation noted. There is mild dilatation of the ascending  aorta measuring 39 mm.   IAS/Shunts: No atrial level shunt detected by color flow Doppler.    LEFT VENTRICLE  PLAX 2D  LVIDd: 4.80 cm Diastology  LVIDs: 3.25 cm LV e' lateral: 6.18 cm/s  LV PW: 0.90 cm LV E/e' lateral: 21.5  LV IVS: 0.80 cm LV e' medial: 2.68 cm/s  LVOT diam: 2.00 cm LV E/e' medial: 49.6  LV SV: 95  LV SV Index: 53 2D Longitudinal Strain  LVOT Area: 3.14 cm 2D Strain GLS (A2C): -17.9 %  2D Strain GLS (A3C): -19.9 %  2D Strain GLS (A4C): -24.7 %  2D Strain GLS Avg: -20.9 %   3D Volume EF:  3D EF: 56 %  LV EDV: 129 ml  LV ESV: 57 ml  LV SV: 72 ml   RIGHT VENTRICLE  RV Basal diam: 5.10 cm  RV Mid diam: 3.40 cm  RV S prime: 11.10 cm/s  TAPSE (M-mode): 2.7 cm  RVSP: 38.0 mmHg   LEFT ATRIUM Index RIGHT ATRIUM Index  LA diam: 4.20 cm 2.34 cm/m RA Pressure: 8.00 mmHg  LA Vol (A2C): 82.1 ml 45.66 ml/m RA Area: 13.20 cm  LA Vol (A4C): 51.6 ml 28.70 ml/m RA Volume: 32.70 ml 18.19 ml/m  LA Biplane Vol: 66.7 ml 37.09 ml/m  AORTIC VALVE  AV Area (Vmax): 0.73 cm  AV Area (Vmean): 0.76 cm  AV Area (VTI): 0.76 cm  AV Vmax: 410.00 cm/s  AV Vmean: 313.500 cm/s  AV VTI: 1.255 m  AV Peak Grad: 67.2 mmHg  AV Mean Grad: 43.0 mmHg  LVOT Vmax: 95.80 cm/s  LVOT Vmean: 76.000 cm/s  LVOT VTI: 0.302 m  LVOT/AV VTI ratio: 0.24  AI PHT: 509 msec   AORTA  Ao Root diam: 3.80 cm  Ao Asc diam: 3.90 cm   MITRAL VALVE TRICUSPID VALVE  TR Peak grad: 30.0 mmHg  TR Vmax: 274.00 cm/s  MV E velocity: 133.00 cm/s Estimated RAP: 8.00  mmHg  MV A velocity: 131.00 cm/s RVSP: 38.0 mmHg  MV E/A ratio: 1.02  SHUNTS  Systemic VTI: 0.30 m  Systemic Diam: 2.00 cm   Dorris Carnes MD  Electronically signed by Dorris Carnes MD  Signature Date/Time: 06/13/2019/6:15:56 PM     Panel Physicians Referring Physician Case Authorizing Physician  Belva Crome, MD (Primary)       Procedures  RIGHT/LEFT HEART CATH AND CORONARY ANGIOGRAPHY     Conclusion  Previously documented severe calcific aortic stenosis by echocardiography. There is peak to peak gradient of 48 mmHg in the Cath Lab with a calculated aortic valve area of 1.56 cm which is discongruent with  the patient's symptoms and echo results.  Total occlusion mid to distal LAD, chronic. Fills by right to left and left to left collaterals. Third diagonal branch contains 90% stenosis. First diagonal contains ostial proximal 60 to 70% narrowing.  Left main has ectasia but no significant obstruction. Ostium is 30% narrowed.  Circumflex is very small without any meaningful obtuse marginal branches.  Mid RCA above previously stented distal segment contains 50 to 60% narrowing.  Normal right heart pressures. RECOMMENDATIONS:  Referred to structural heart team, Dr. Angelena Form for further evaluation.  Major clinical issue is idiopathic thrombocytopenia which has responded nicely to 170,000 by use of prednisone and Nplate.     Recommendations  Antiplatelet/Anticoag Recommend Aspirin 81mg  daily for moderate CAD.        Indications  Aortic stenosis, severe [I35.0 (ICD-10-CM)]  Coronary artery disease involving native coronary artery of native heart without angina pectoris [I25.10 (ICD-10-CM)]     Procedural Details  Technical Details The right radial area was sterilely prepped and draped. Intravenous sedation with Versed and fentanyl was administered. 1% Xylocaine was infiltrated to achieve local analgesia. Using real-time vascular ultrasound, a double wall stick with an angiocath was  utilized to obtain intra-arterial access. A VUS image was saved for the permanent record.The modified Seldinger technique was used to place a 35F " Slender" sheath in the right radial artery. Weight based heparin was administered. Coronary angiography was done using 5 F catheters. Right coronary angiography was performed with a JR4. Left ventricular hemodymic recordings and angiography was done using the JR 4 catheter and hand injection. Left coronary angiography was performed with a JL 3.5 cm.  Right heart catheterization was performed by exchanging a previously placed antecubital IV angio-cath for a 5 French Slender sheath. 1% Xylocaine was used to locally nesthetize the area around the IV site. The IV catheter was wired using an .018 guidewire. The modified Seldinger technique was used to place the 5 [place] sheath. Double glove technique was used to enhance sterility. After sheath insertion, right heart cath was performed using a 5 French balloon tipped catheter and fluoroscopic guidance. Pressures were recorded in each chamber and in the pulmonary capillary wedge position.. The main pulmonary artery O2 saturation was sampled.   Hemostasis was achieved using a pneumatic band.  During this procedure the patient is administered a total of Versed 1 mg and Fentanyl 25 mcg to achieve and maintain moderate conscious sedation. The patient's heart rate, blood pressure, and oxygen saturation are monitored continuously during the procedure. The period of conscious sedation is 28 minutes, of which I was present face-to-face 100% of this time. Estimated blood loss <50 mL.   During this procedure medications were administered to achieve and maintain moderate conscious sedation while the patient's heart rate, blood pressure, and oxygen saturation were continuously monitored and I was present face-to-face 100% of this time.     Medications  (Filter: Administrations occurring from 07/26/19 1115 to 07/26/19 1214)    Continuous medications are totaled by the amount administered until 07/26/19 1214.  Heparin (Porcine) in NaCl 1000-0.9 UT/500ML-% SOLN (mL)  Total volume: 500 mL  Date/Time   Rate/Dose/Volume Action  07/26/19 1118  500 mL Given  Heparin (Porcine) in NaCl 1000-0.9 UT/500ML-% SOLN (mL)  Total volume: 500 mL  Date/Time   Rate/Dose/Volume Action  07/26/19 1118  500 mL Given  fentaNYL (SUBLIMAZE) injection (mcg)  Total dose: 25 mcg  Date/Time   Rate/Dose/Volume Action  07/26/19 1135  25 mcg Given  midazolam (VERSED)  injection (mg)  Total dose: 1 mg  Date/Time   Rate/Dose/Volume Action  07/26/19 1137  0.5 mg Given  1141  0.5 mg Given  lidocaine (PF) (XYLOCAINE) 1 % injection (mL)  Total volume: 3 mL  Date/Time   Rate/Dose/Volume Action  07/26/19 1140  1 mL Given  1142  2 mL Given  Radial Cocktail/Verapamil only (mL)  Total volume: 10 mL  Date/Time   Rate/Dose/Volume Action  07/26/19 1143  10 mL Given  heparin sodium (porcine) injection (Units)  Total dose: 3,500 Units  Date/Time   Rate/Dose/Volume Action  07/26/19 1152  3,500 Units Given  iohexol (OMNIPAQUE) 350 MG/ML injection (mL)  Total volume: 60 mL  Date/Time   Rate/Dose/Volume Action  07/26/19 1207  60 mL Given     Sedation Time  Sedation Time Physician-1: 28 minutes 21 seconds        Contrast  Medication Name Total Dose  iohexol (OMNIPAQUE) 350 MG/ML injection 60 mL     Radiation/Fluoro  Fluoro time: 6.2 (min)  DAP: 13822 (mGycm2)  Cumulative Air Kerma: 255 (mGy)        Coronary Findings  Diagnostic  Dominance: Right  Left Anterior Descending  Collaterals  Dist LAD filled by collaterals from RPDA.    Mid LAD to Dist LAD lesion 100% stenosed  Mid LAD to Dist LAD lesion is 100% stenosed.   First Diagonal Branch  1st Diag lesion 65% stenosed  1st Diag lesion is 65% stenosed.   First Septal Branch  Vessel is small in size.   Third Diagonal Branch  3rd Diag lesion 70% stenosed  3rd Diag  lesion is 70% stenosed.   Left Circumflex  Vessel is small.   First Obtuse Marginal Branch  Vessel is small in size.   Second Obtuse Marginal Branch  Vessel is small in size.   Third Obtuse Marginal Branch  Vessel is small in size.   Right Coronary Artery  Prox RCA to Mid RCA lesion 60% stenosed  Prox RCA to Mid RCA lesion is 60% stenosed.  Mid RCA to Dist RCA lesion 10% stenosed  Mid RCA to Dist RCA lesion is 10% stenosed. The lesion was previously treated.   Right Posterior Descending Artery  RPDA lesion 40% stenosed  RPDA lesion is 40% stenosed.  Intervention  No interventions have been documented.          Right Heart  Right Heart Pressures Hemodynamic findings consistent with pulmonary hypertension. LV EDP is normal.   Right Atrium The right atrial size is normal.                 Left Heart  Left Ventricle LV end diastolic pressure is normal.   Aortic Valve There is severe aortic valve stenosis. The aortic valve is calcified.     Coronary Diagrams  Diagnostic  Dominance: Right  &&&&&  Intervention       Implants  No implant documentation for this case.      [employer] Images Link to Procedure Log  Show images for CARDIAC CATHETERIZATION Procedure Log     Images on Long Term Storage   Show images for [place], [place]            Hemo Data    Most Recent Value  Fick Cardiac Output 5.48 L/min  Fick Cardiac Output Index 3.02 (L/min)/BSA  Aortic Mean Gradient 32.24 mmHg  Aortic Peak Gradient 48 mmHg  Aortic Valve Area 1.56  Aortic Value Area Index 0.86 cm2/BSA  RA A  Wave 9 mmHg  RA V Wave 8 mmHg  RA Mean 6 mmHg  RV Systolic Pressure 33 mmHg  RV Diastolic Pressure 4 mmHg  RV EDP 8 mmHg  PA Systolic Pressure 32 mmHg  PA Diastolic Pressure 10 mmHg  PA Mean 19 mmHg  PW A Wave 16 mmHg  PW V Wave 14 mmHg  PW Mean 11 mmHg  AO Systolic Pressure 093 mmHg  AO Diastolic Pressure 53 mmHg  AO Mean 78 mmHg  LV Systolic Pressure 818 mmHg  LV  Diastolic Pressure 5 mmHg  LV EDP 17 mmHg  AOp Systolic Pressure 299 mmHg  AOp Diastolic Pressure 51 mmHg  AOp Mean Pressure 75 mmHg  LVp Systolic Pressure 371 mmHg  LVp Diastolic Pressure 8 mmHg  LVp EDP Pressure 17 mmHg  QP/QS 1  TPVR Index 6.29 HRUI  TSVR Index 27.48 HRUI  PVR SVR Ratio 0.1  TPVR/TSVR Ratio 0.23    Carotid Arterial Duplex Study   Indications: Pre tavr.  Risk Factors: Hypertension, coronary artery disease.  Comparison Study: no prior   Performing Technologist: Abram Sander RVS    Examination Guidelines: A complete evaluation includes B-mode imaging,  spectral  Doppler, color Doppler, and power Doppler as needed of all accessible  portions  of each vessel. Bilateral testing is considered an integral part of a  complete  examination. Limited examinations for reoccurring indications may be  performed  as noted.    Right Carotid Findings:  +----------+--------+--------+--------+------------------+--------+   PSV cm/sEDV cm/sStenosisPlaque DescriptionComments  +----------+--------+--------+--------+------------------+--------+  CCA Prox 73 9  heterogenous    +----------+--------+--------+--------+------------------+--------+  CCA Distal60 14  heterogenous    +----------+--------+--------+--------+------------------+--------+  ICA Prox 62 15 1-39% heterogenous    +----------+--------+--------+--------+------------------+--------+  ICA Distal68 19      +----------+--------+--------+--------+------------------+--------+  ECA 90       +----------+--------+--------+--------+------------------+--------+   +----------+--------+-------+--------+-------------------+   PSV cm/sEDV cmsDescribeArm Pressure (mmHG)  +----------+--------+-------+--------+-------------------+  Subclavian108      +----------+--------+-------+--------+-------------------+   +---------+--------+--+--------+-+---------+    VertebralPSV cm/s38EDV cm/s9Antegrade  +---------+--------+--+--------+-+---------+     Left Carotid Findings:  +----------+--------+--------+--------+------------------+--------+   PSV cm/sEDV cm/sStenosisPlaque DescriptionComments  +----------+--------+--------+--------+------------------+--------+  CCA Prox 109 11  heterogenous    +----------+--------+--------+--------+------------------+--------+  CCA Distal59 5  heterogenous    +----------+--------+--------+--------+------------------+--------+  ICA Prox 57 11 1-39% heterogenous    +----------+--------+--------+--------+------------------+--------+  ICA Distal69 19      +----------+--------+--------+--------+------------------+--------+  ECA 81       +----------+--------+--------+--------+------------------+--------+   +----------+--------+--------+--------+-------------------+   PSV cm/sEDV cm/sDescribeArm Pressure (mmHG)  +----------+--------+--------+--------+-------------------+  [id number]      +----------+--------+--------+--------+-------------------+   +---------+--------+--+--------+-+---------+  VertebralPSV cm/s23EDV cm/s7Antegrade  +---------+--------+--+--------+-+---------+       Summary:  Right Carotid: Velocities in the right ICA are consistent with a 1-39%  stenosis.   Left Carotid: Velocities in the left ICA are consistent with a 1-39%  stenosis.   Vertebrals: Bilateral vertebral arteries demonstrate antegrade flow.   *See table(s) above for measurements and observations.      Electronically signed by Harold Barban MD on 07/31/2019 at 1:14:58 PM.  ADDENDUM REPORT: 08/02/2019 13:52  EXAM:  OVER-READ INTERPRETATION CT CHEST  The following report is an over-read performed by radiologist Dr.  Samara Snide [facility], [place] on 08/02/2019. This over-read  does not include interpretation of cardiac or coronary anatomy or   pathology. The coronary CTA interpretation by the cardiologist is  attached.  COMPARISON: 03/29/2013 chest CT.  FINDINGS:  Please see the separate concurrent chest CT angiogram report for  details.  IMPRESSION:  Please see the separate concurrent chest  CT angiogram report for  details.  Electronically Signed  By: Ilona Sorrel M.D.  On: 08/02/2019 13:52   Addended by Sharyn Blitz, MD on 08/02/2019 1:55 PM  Study Result   CLINICAL DATA: Aortic Stenosis  EXAM:  Cardiac TAVR CT  TECHNIQUE:  The patient was scanned on a Siemens Force 631 slice scanner. A 120  kV retrospective scan was triggered in the ascending thoracic aorta  at 140 HU's. Gantry rotation speed was 250 msecs and collimation was  .6 mm. No beta blockade or nitro were given. The 3D data set was  reconstructed in 5% intervals of the R-R cycle. Systolic and  diastolic phases were analyzed on a dedicated work station using  MPR, MIP and VRT modes. The patient received 80 cc of contrast.  FINDINGS:  Aortic Valve: Tri leaflet Calcified with restricted leaflet motion  Aorta: No aneurysm Normal arch vessels mild atherosclerosis  Sino-tubular Junction: 28 mm  Ascending Thoracic Aorta: 36 mm  Aortic Arch: 34 mm  Descending Thoracic Aorta: 26 mm  Sinus of Valsalva Measurements:  Non-coronary: 35.1 mm  Right - coronary: 34.2 mm  Left - coronary: 36.3 mm  Coronary Artery Height above Annulus:  Left Main: 15.9 mm above annulus  Right Coronary: 20 mm above annulus  Virtual Basal Annulus Measurements:  Maximum / Minimum Diameter: 27.2 mm x 22.5 mm  Perimeter: 80 mm  Area: 484 mm 2  Coronary Arteries: Sufficient height above annulus for deployment  Optimum Fluoroscopic Angle for Delivery: LAO 15 Caudal 17 degrees  IMPRESSION:  1. Tri leaflet AV with annular area of 484 mm 2 suitable for a 26 mm  Sapien 3 valve  2. Optimum angiographic angle for deployment LAO 15 Caudal 17  degrees  3. Coronary arteries suitable  height above annulus for deployment  4. Normal aortic root 3.6 cm  Jenkins Rouge  Electronically Signed:  By: Jenkins Rouge M.D.  On: 07/29/2019 12:18  CLINICAL DATA: Severe symptomatic aortic stenosis. Pre-TAVR  evaluation.  EXAM:  CT ANGIOGRAPHY CHEST, ABDOMEN AND PELVIS  TECHNIQUE:  Multidetector CT imaging through the chest, abdomen and pelvis was  performed using the standard protocol during bolus administration of  intravenous contrast. Multiplanar reconstructed images and MIPs were  obtained and reviewed to evaluate the vascular anatomy.  CONTRAST: 145mL OMNIPAQUE IOHEXOL 350 MG/ML SOLN  COMPARISON: 03/29/2013 chest CT. 8259 CT abdomen/pelvis.  FINDINGS:  CTA CHEST FINDINGS  Cardiovascular: Mild cardiomegaly. Mild lipomatous hypertrophy of  the interatrial septum. Diffusely thickened and severely calcified  aortic valve. No significant pericardial effusion/thickening. Left  anterior descending and right coronary atherosclerosis.  Atherosclerotic nonaneurysmal thoracic aorta. Top-normal caliber  main pulmonary artery (3.0 cm diameter). No central pulmonary  emboli.  Mediastinum/Nodes: Several scattered small hypodense bilateral  thyroid nodules, largest 1.0 cm on the right. Not clinically  significant; no follow-up imaging recommended (ref: J Am Coll  Radiol. 2015 Feb;12(2): 143-50). Unremarkable esophagus. No  pathologically enlarged axillary, mediastinal or hilar lymph nodes.  Lungs/Pleura: No pneumothorax. No pleural effusion. Circumscribed  3.6 x 3.0 cm solid central left upper lobe lung mass (series 8/image  47), increased from 2.6 x 2.5 cm on 03/29/2013 CT. No acute  consolidative airspace disease or additional significant pulmonary  nodules.  Musculoskeletal: No aggressive appearing focal osseous lesions.  Heart thoracic spondylosis. Moderate gynecomastia, asymmetric to the  right, similar.  CTA ABDOMEN AND PELVIS FINDINGS  Hepatobiliary: Normal liver with no liver  mass. Normal gallbladder  with no radiopaque cholelithiasis. No biliary  ductal dilatation.  Pancreas: Normal, with no mass or duct dilation.  Spleen: Splenectomy.  Adrenals/Urinary Tract: Normal adrenals. No hydronephrosis. Simple  2.1 cm interpolar right renal cyst. No additional contour deforming  renal lesions. Stable chronic diffuse bladder wall thickening.  Bladder not significantly distended.  Stomach/Bowel: Normal non-distended stomach. Normal caliber small  bowel with no small bowel wall thickening. Appendectomy. Minimal  sigmoid diverticulosis with no large bowel wall thickening or  significant pericolonic fat stranding.  Vascular/Lymphatic: Atherosclerotic abdominal aorta with ectatic 2.5  cm infrarenal abdominal aorta. No pathologically enlarged lymph  nodes in the abdomen or pelvis.  Reproductive: Moderate prostatomegaly.  Other: No pneumoperitoneum, ascites or focal fluid collection.  Musculoskeletal: No aggressive appearing focal osseous lesions.  Marked lumbar spondylosis.  VASCULAR MEASUREMENTS PERTINENT TO TAVR:  AORTA:  Minimal Aortic Diameter-16.2 x 14.9 mm  Severity of Aortic Calcification-moderate to severe  RIGHT PELVIS:  Right Common Iliac Artery -  Minimal Diameter-10.3 x 9.6 mm  Tortuosity-mild  Calcification-moderate  Right External Iliac Artery -  Minimal Diameter-8.8 x 8.5 mm  Tortuosity-moderate  Calcification-moderate  Right Common Femoral Artery -  Minimal Diameter-9.2 x 6.3 mm  Tortuosity-mild  Calcification-moderate to severe  LEFT PELVIS:  Left Common Iliac Artery -  Minimal Diameter-9.7 x 8.8 mm  Tortuosity-mild  Calcification-moderate  Left External Iliac Artery -  Minimal Diameter-7.4 x 6.1 mm  Tortuosity-moderate  Calcification-moderate  Left Common Femoral Artery -  Minimal Diameter-8.4 x 7.8 mm  Tortuosity-mild  Calcification-moderate  Review of the MIP images confirms the above findings.  IMPRESSION:  1. Vascular findings and  measurements pertinent to potential TAVR  procedure, as detailed.  2. Severe thickening and calcification of the aortic valve,  compatible with the reported history of severe symptomatic aortic  stenosis.  3. Mild cardiomegaly. Two-vessel coronary atherosclerosis.  4. Circumscribed 3.6 cm solid central left upper lung mass,  increased from 2.6 cm on 2015 chest CT study. Relatively slow rate  of growth is suggestive of a benign neoplasm such as pulmonary  hamartoma, although a low-grade malignancy cannot be excluded.  PET-CT may be obtained for further characterization as clinically  warranted.  5. Moderate prostatomegaly.  6. Minimal sigmoid diverticulosis.  7. Aortic Atherosclerosis (ICD10-I70.0).  Electronically Signed  By: Ilona Sorrel M.D.  On: 07/31/2019 04:50    Impression:   This 86 year old gentleman has stage D, severe, symptomatic aortic stenosis with New York Heart Association class II symptoms of exertional fatigue and shortness of breath consistent with chronic diastolic congestive heart failure. He feels like his symptoms are significantly limiting his activity level. I have personally reviewed his 2D echocardiogram, cardiac catheterization, and CTA studies. His echocardiogram shows a thickened and calcified aortic valve with restricted mobility. The mean gradient is 43 mmHg with a dimensionless index of 0.24 and a valve area of 0.76 cm consistent with severe aortic stenosis. There is moderate aortic insufficiency with a pressure half-time of 509 ms. Left ventricular systolic function is normal. Cardiac catheterization showed a peak to peak gradient of 48 mmHg. 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 There is 60 to 70% first diagonal stenosis. A third diagonal branch has 90% stenosis. The mid RCA above a previously stented distal segment contains about 50 to 60% narrowing. The left  circumflex is small without any significant disease. I agree that aortic valve replacement is indicated in this 86 year old fairly active gentleman with  lifestyle limiting severe aortic stenosis. I think transcatheter aortic valve replacement would be the best option for him. His gated cardiac CTA shows anatomy suitable for transcatheter aortic valve replacement using a SAPIEN 3 valve. Abdominal and pelvic CTA shows adequate pelvic vascular anatomy to allow transfemoral insertion.   His CTA of the chest also shows a 3.6 x 3.0 cm solid mass in the left upper lobe of the lung which is smoothly marginated and has a benign appearance. Review of his prior CT scan from 03/29/2013 showed that it measured 2.6 x 2.5 cm at that time. Given the appearance and slow increase in size over the past 6 years I think this is certainly a benign lesion such as a hamartoma. He said that this has been followed for many years at [facility] and he was told that nothing need to be done about it. I would agree with that.   The patient and his wife were counseled at length regarding treatment alternatives for management of severe symptomatic aortic stenosis. The risks and benefits of surgical intervention has been discussed in detail. Long-term prognosis with medical therapy was discussed. Alternative approaches such as conventional surgical aortic valve replacement, transcatheter aortic valve replacement, and palliative medical therapy were compared and contrasted at length. This discussion was placed in the context of the patient's own specific clinical presentation and past medical history. All of their questions have been addressed.   Following the decision to proceed with transcatheter aortic valve replacement, a discussion was held regarding what types of management strategies would be attempted intraoperatively in the event of life-threatening complications, including whether or not the patient would be considered a candidate for the  use of cardiopulmonary bypass and/or conversion to open sternotomy for attempted surgical intervention. The patient is aware of the fact that transient use of cardiopulmonary bypass may be necessary. Given his age and 29 years I do not think he would be a candidate for emergent sternotomy to manage any intraoperative complications. The patient has been advised of a variety of complications that might develop including but not limited to risks of death, stroke, paravalvular leak, aortic dissection or other major vascular complications, aortic annulus rupture, device embolization, cardiac rupture or perforation, mitral regurgitation, acute myocardial infarction, arrhythmia, heart block or bradycardia requiring permanent pacemaker placement, congestive heart failure, respiratory failure, renal failure, pneumonia, infection, other late complications related to structural valve deterioration or migration, or other complications that might ultimately cause a temporary or permanent loss of functional independence or other long term morbidity. The patient provides full informed consent for the procedure as described and all questions were answered.   Plan:   Transfemoral transcatheter aortic valve replacement.  Gaye Pollack, MD

## 2019-09-06 NOTE — CV Procedure (Signed)
[facility]  TAVR OPERATIVE NOTE   Date of Procedure:  09/06/2019  Preoperative Diagnosis: Severe Aortic Stenosis   Postoperative Diagnosis: Same   Procedure:    Transcatheter Aortic Valve Replacement - Transfemoral Approach  Edwards Sapien 3 THV (size 26 mm, model #[id number], serial # [id number])   Co-Surgeons:  Lauree Chandler, MD and Gaye Pollack, MD   Anesthesiologist:  Ola Spurr  Echocardiographer:  Meda Coffee  Pre-operative Echo Findings:  Severe aortic stenosis  Normal left ventricular systolic function  Post-operative Echo Findings:  No paravalvular leak  Normal left ventricular systolic function  BRIEF CLINICAL NOTE AND INDICATIONS FOR SURGERY  86 yo male with history of idiopathic thrombocytopenia, anemia, anxiety, CAD, colon cancer, hyperlipidemia, GERD, hairy cell leukemia, HTN and severe aortic stenosis who is here today for TAVR. He has been followed by Dr. Tamala Julian for moderate AS. Most recent echo 06/13/19 with LVEF=60-65%. The aortic valve leaflets are thickened and calcified with limited leaflet mobility. The mean gradient is 43 mmHg, peak gradient 67 mmHg, AVA 0.73 cm2. Dimensionless index 0.24. This is consistent with severe AS. Moderate AI. Mild MR. Cardiac cath 07/26/19 with chronically occluded mid LAD, moderate disease in the diagonals and mid RCA. Medical therapy recommended for CAD. He has chronic idiopathic thrombocytopenia and is followed by Dr. Benay Spice and has received Nplate therapy and prednisone.    During the course of the patient's preoperative work up they have been evaluated comprehensively by a multidisciplinary team of specialists coordinated through the [facility] in the [facility].  They have been demonstrated to suffer from symptomatic severe aortic stenosis as noted above. The patient has been counseled extensively as to the relative risks and benefits of all options for  the treatment of severe aortic stenosis including long term medical therapy, conventional surgery for aortic valve replacement, and transcatheter aortic valve replacement.  The patient has been independently evaluated by Dr. Cyndia Bent with CT surgery and they are felt to be at high risk for conventional surgical aortic valve replacement. The surgeon indicated the patient would be a poor candidate for conventional surgery. Based upon review of all of the patient's preoperative diagnostic tests they are felt to be candidate for transcatheter aortic valve replacement using the transfemoral approach as an alternative to high risk conventional surgery.    Following the decision to proceed with transcatheter aortic valve replacement, a discussion has been held regarding what types of management strategies would be attempted intraoperatively in the event of life-threatening complications, including whether or not the patient would be considered a candidate for the use of cardiopulmonary bypass and/or conversion to open sternotomy for attempted surgical intervention.  The patient has been advised of a variety of complications that might develop peculiar to this approach including but not limited to risks of death, stroke, paravalvular leak, aortic dissection or other major vascular complications, aortic annulus rupture, device embolization, cardiac rupture or perforation, acute myocardial infarction, arrhythmia, heart block or bradycardia requiring permanent pacemaker placement, congestive heart failure, respiratory failure, renal failure, pneumonia, infection, other late complications related to structural valve deterioration or migration, or other complications that might ultimately cause a temporary or permanent loss of functional independence or other long term morbidity.  The patient provides full informed consent for the procedure as described and all questions were answered preoperatively.    DETAILS OF THE  OPERATIVE PROCEDURE  PREPARATION:   The patient is brought to the operating room on the above mentioned date  and central monitoring was established by the anesthesia team including placement of a radial arterial line. The patient is placed in the supine position on the operating table.  Intravenous antibiotics are administered. Conscious sedation is used.   Baseline transthoracic echocardiogram was performed. The patient's chest, abdomen, both groins, and both lower extremities are prepared and draped in a sterile manner. A time out procedure is performed.   PERIPHERAL ACCESS:   The right neck was prepped and draped. Right IJ access with u/s guidance and placement of a 7 French sheath. Tempo pacemaker placed in the RV. Using the modified Seldinger technique, femoral arterial access was obtained with placement of a 6 Fr sheath on the left side.  A pigtail diagnostic catheter was passed through the femoral arterial sheath under fluoroscopic guidance into the aortic root.  Aortic root angiography was performed in order to determine the optimal angiographic angle for valve deployment.  TRANSFEMORAL ACCESS:  A micropuncture kit was used to gain access to the right femoral artery. Position confirmed with angiography. Pre-closure with double ProGlide closure devices. The patient was heparinized systemically and ACT verified > 250 seconds.    A 14 Fr transfemoral E-sheath was introduced into the right femoral artery after progressively dilating over an Amplatz superstiff wire. An AL-2 catheter was used to direct a straight-tip exchange length wire across the native aortic valve into the left ventricle. This was exchanged out for a pigtail catheter and position was confirmed in the LV apex. Simultaneous LV and Ao pressures were recorded.  The pigtail catheter was then exchanged for an Amplatz Extra-stiff wire in the LV apex.   TRANSCATHETER HEART VALVE DEPLOYMENT:  An Edwards Sapien 3 THV (size 26 mm) was  prepared and crimped per manufacturer's guidelines, and the proper orientation of the valve is confirmed on the [employer] delivery system. The valve was advanced through the introducer sheath using normal technique until in an appropriate position in the abdominal aorta beyond the sheath tip. The balloon was then retracted and using the fine-tuning wheel was centered on the valve. The valve was then advanced across the aortic arch using appropriate flexion of the catheter. The valve was carefully positioned across the aortic valve annulus. The Commander catheter was retracted using normal technique. Once final position of the valve has been confirmed by angiographic assessment, the valve is deployed while temporarily holding ventilation and during rapid ventricular pacing to maintain systolic blood pressure < 50 mmHg and pulse pressure < 10 mmHg. The balloon inflation is held for >3 seconds after reaching full deployment volume. Once the balloon has fully deflated the balloon is retracted into the ascending aorta and valve function is assessed using TTE. There is felt to be no paravalvular leak and no central aortic insufficiency.  The patient's hemodynamic recovery following valve deployment is good.  The deployment balloon and guidewire are both removed. Echo demostrated acceptable post-procedural gradients, stable mitral valve function, and no AI.   PROCEDURE COMPLETION:  The sheath was then removed and closure devices were completed. Protamine was administered once femoral arterial repair was complete. The temporary pacemaker was left in place. The pigtail catheters and femoral sheath were removed with a Mynx closure device placed in the artery.    The patient tolerated the procedure well and is transported to the surgical intensive care in stable condition. There were no immediate intraoperative complications. All sponge instrument and needle counts are verified correct at completion of the  operation.   No blood products were  administered during the operation.  The patient received a total of 40 mL of intravenous contrast during the procedure.  Lauree Chandler MD 09/06/2019 2:16 PM

## 2019-09-06 NOTE — Progress Notes (Signed)
  [place] VALVE TEAM  Patient doing well s/p TAVR. He is hemodynamically stable. Groin sites stable. ECG with sinus and old bifascicular block. Tempo wire in place in right IJ but not hooked up to pacer. He has no high grade block. Will go to ICU. Plan for early ambulation after bedrest completed and hopeful discharge over the next 24-48 hours.   Angelena Form PA-C  [facility]  Pager [phone]

## 2019-09-07 ENCOUNTER — Encounter (HOSPITAL_COMMUNITY): Payer: Self-pay | Admitting: Cardiovascular Disease

## 2019-09-07 ENCOUNTER — Inpatient Hospital Stay (HOSPITAL_COMMUNITY): Payer: Medicare Other

## 2019-09-07 DIAGNOSIS — Z952 Presence of prosthetic heart valve: Secondary | ICD-10-CM

## 2019-09-07 DIAGNOSIS — I452 Bifascicular block: Secondary | ICD-10-CM

## 2019-09-07 DIAGNOSIS — I350 Nonrheumatic aortic (valve) stenosis: Principal | ICD-10-CM

## 2019-09-07 LAB — BASIC METABOLIC PANEL
Anion gap: 11 (ref 5–15)
BUN: 23 mg/dL (ref 8–23)
CO2: 20 mmol/L — ABNORMAL LOW (ref 22–32)
Calcium: 8.3 mg/dL — ABNORMAL LOW (ref 8.9–10.3)
Chloride: 104 mmol/L (ref 98–111)
Creatinine, Ser: 1.28 mg/dL — ABNORMAL HIGH (ref 0.61–1.24)
GFR calc Af Amer: 56 mL/min — ABNORMAL LOW (ref >60)
GFR calc non Af Amer: 48 mL/min — ABNORMAL LOW (ref >60)
Glucose, Bld: 106 mg/dL — ABNORMAL HIGH (ref 70–99)
Potassium: 4.6 mmol/L (ref 3.5–5.1)
Sodium: 135 mmol/L (ref 135–145)

## 2019-09-07 LAB — URINALYSIS, ROUTINE W REFLEX MICROSCOPIC
Bilirubin Urine: NEGATIVE
Glucose, UA: NEGATIVE mg/dL
Ketones, ur: NEGATIVE mg/dL
Leukocytes,Ua: NEGATIVE
Nitrite: NEGATIVE
Protein, ur: 30 mg/dL — AB
Specific Gravity, Urine: 1.013 (ref 1.005–1.030)
pH: 5 (ref 5.0–8.0)

## 2019-09-07 LAB — CBC
HCT: 38.4 % — ABNORMAL LOW (ref 39.0–52.0)
Hemoglobin: 12.4 g/dL — ABNORMAL LOW (ref 13.0–17.0)
MCH: 25.8 pg — ABNORMAL LOW (ref 26.0–34.0)
MCHC: 32.3 g/dL (ref 30.0–36.0)
MCV: 80 fL (ref 80.0–100.0)
Platelets: 99 10*3/uL — ABNORMAL LOW (ref 150–400)
RBC: 4.8 MIL/uL (ref 4.22–5.81)
RDW: 17.6 % — ABNORMAL HIGH (ref 11.5–15.5)
WBC: 18.9 10*3/uL — ABNORMAL HIGH (ref 4.0–10.5)
nRBC: 0.1 % (ref 0.0–0.2)

## 2019-09-07 LAB — MAGNESIUM: Magnesium: 1.7 mg/dL (ref 1.7–2.4)

## 2019-09-07 LAB — ECHOCARDIOGRAM LIMITED
Height: 64 in
Weight: 2724.8 oz

## 2019-09-07 MED ORDER — ALBUMIN HUMAN 5 % IV SOLN: 12.5000 g | Freq: Once | INTRAVENOUS | Status: AC

## 2019-09-07 MED ORDER — ALBUMIN HUMAN 5 % IV SOLN
INTRAVENOUS | Status: AC
  Administered 2019-09-07: 12.5 g
  Filled 2019-09-07: qty 250

## 2019-09-07 NOTE — Progress Notes (Addendum)
Progress Note  Patient Name: KLYE BESECKER Date of Encounter: 09/07/2019  [facility] Cardiologist: Sinclair Grooms, MD \ TAVR MD: Bartle/Nahsir Venezia  Subjective   Pt without specific complaints this am other than feeling tired. Temperature 101.8 this am.   Inpatient Medications    Scheduled Meds: . aspirin  81 mg Oral Daily  . atorvastatin  20 mg Oral q1800  . Chlorhexidine Gluconate Cloth  6 each Topical Daily  . pantoprazole  40 mg Oral Daily  . predniSONE  40 mg Oral Q breakfast  . sertraline  50 mg Oral QPM  . sodium chloride flush  3 mL Intravenous Q12H  . tamsulosin  0.8 mg Oral QPC supper   Continuous Infusions: . sodium chloride    . cefUROXime (ZINACEF)  IV Stopped (09/06/19 2215)  . lactated ringers    . nitroGLYCERIN 15 mcg/min (09/06/19 1532)  . phenylephrine (NEO-SYNEPHRINE) Adult infusion     PRN Meds: sodium chloride, acetaminophen **OR** acetaminophen, ondansetron (ZOFRAN) IV, oxyCODONE, sodium chloride flush, traMADol   Vital Signs    Vitals:   09/07/19 0500 09/07/19 0532 09/07/19 0600 09/07/19 0700  BP: 138/63  139/63 (!) 100/50  Pulse: 78  77 80  Resp: 19  (!) 30 (!) 22  Temp:  (!) 101.7 F (38.7 C)    TempSrc:  Oral    SpO2: 91%  94% 96%  Weight:      Height:        Intake/Output Summary (Last 24 hours) at 09/07/2019 0804 Last data filed at 09/07/2019 0644 Gross per 24 hour  Intake 2500 ml  Output 1250 ml  Net 1250 ml   Last 3 Weights 09/07/2019 09/06/2019 09/02/2019  Weight (lbs) 170 lb 4.8 oz 169 lb 170 lb 12.8 oz  Weight (kg) 77.248 kg 76.658 kg 77.474 kg      Telemetry    Sinus with PVCs - Personally Reviewed  ECG    Sinus, RBBB, LAFB - Personally Reviewed  Physical Exam   GEN: No acute distress.   Neck: No JVD Cardiac: RRR, no murmurs, rubs, or gallops.  Respiratory: Clear to auscultation bilaterally. GI: Soft, nontender, non-distended  MS: No edema; No deformity. Neuro:  Nonfocal  Psych: Normal affect   Labs      High Sensitivity Troponin:  No results for input(s): TROPONINIHS in the last 720 hours.    Chemistry Recent Labs  Lab 09/02/19 0847 09/06/19 1253 09/06/19 1355 09/06/19 1436 09/07/19 0237  NA 140   < > 138 139 135  K 4.1   < > 3.4* 3.6 4.6  CL 108   < > 101 101 104  CO2 23  --   --   --  20*  GLUCOSE 91   < > 109* 102* 106*  BUN 21   < > 22 23 23   CREATININE 0.99   < > 1.00 1.00 1.28*  CALCIUM 8.9  --   --   --  8.3*  PROT 5.7*  --   --   --   --   ALBUMIN 3.3*  --   --   --   --   AST 20  --   --   --   --   ALT 21  --   --   --   --   ALKPHOS 64  --   --   --   --   BILITOT 1.0  --   --   --   --  GFRNONAA >60  --   --   --  48*  GFRAA >60  --   --   --  56*  ANIONGAP 9  --   --   --  11   < > = values in this interval not displayed.     Hematology Recent Labs  Lab 09/01/19 0911 09/01/19 0911 09/02/19 0847 09/06/19 1253 09/06/19 1355 09/06/19 1436 09/07/19 0237  WBC 14.4*  --  13.5*  --   --   --  18.9*  RBC 4.53  --  4.81  --   --   --  4.80  HGB 11.7*  --  12.4*   < > 11.2* 11.9* 12.4*  HCT 36.6*   < > 39.1   < > 33.0* 35.0* 38.4*  MCV 80.8  --  81.3  --   --   --  80.0  MCH 25.8*  --  25.8*  --   --   --  25.8*  MCHC 32.0  --  31.7  --   --   --  32.3  RDW 17.1*  --  17.2*  --   --   --  17.6*  PLT 161  --  143*  --   --   --  99*   < > = values in this interval not displayed.    BNP Recent Labs  Lab 09/02/19 0847  BNP 296.9*     DDimer No results for input(s): DDIMER in the last 168 hours.   Radiology    DG Chest Port 1 View  Result Date: 09/06/2019 CLINICAL DATA:  Status post TAVR EXAM: PORTABLE CHEST 1 VIEW COMPARISON:  09/02/2019 FINDINGS: Status post TAVR. There is a right IJ approach sheath with a pacer lead terminating in the right ventricle. Left chest mass is unchanged. No focal airspace consolidation or pleural effusion. IMPRESSION: Status post TAVR without focal airspace disease. Electronically Signed   By: Ulyses Jarred M.D.   On:  09/06/2019 19:07   ECHOCARDIOGRAM LIMITED  Result Date: 09/06/2019    ECHOCARDIOGRAM LIMITED REPORT   Patient Name:   RIYAD KEENA Date of Exam: 09/06/2019 Medical Rec #:  [id number]     Height:       64.0 in Accession #:    [id number]    Weight:       170.8 lb Date of Birth:  1924/02/23    BSA:          1.829 m Patient Age:    86 years      BP:           207/88 mmHg Patient Gender: M             HR:           52 bpm. Exam Location:  Inpatient Procedure: Limited Echo, Color Doppler and Cardiac Doppler Indications:     Aortic Stenosis i35.0  History:         Patient has prior history of Echocardiogram examinations, most                  recent 06/13/2019. CAD; Risk Factors:Hypertension and                  Dyslipidemia.  Sonographer:     Raquel Sarna Senior RDCS Referring Phys:  [place] Diagnosing Phys: Ena Dawley MD  Sonographer Comments: TAVR Procedure for implantation of 35mm Edwards Ultra Sapien Bioprosthetic IMPRESSIONS  1. This was a periprocedural TTE  during a TAVR procedure. A 26 mm Edwards-SAPIEN 3 Ultra valve was successfully deployed in the aortic position with improvement of peak/mean transaortic gradients from 60/37 mmHg to 13/7 mmHg. No paravalvular leak and no  pericardial effusion prior or post procedure.  2. Left ventricular ejection fraction, by estimation, is 60 to 65%. The left ventricle has normal function. The left ventricle has no regional wall motion abnormalities. There is mild concentric left ventricular hypertrophy.  3. Right ventricular systolic function is normal. The right ventricular size is normal. There is normal pulmonary artery systolic pressure.  4. The mitral valve is normal in structure. Mild mitral valve regurgitation. No evidence of mitral stenosis.  5. The aortic valve is normal in structure. Aortic valve regurgitation is moderate. Severe aortic valve stenosis. Aortic valve mean gradient measures 37.0 mmHg.  6. The inferior vena cava is normal in size with  greater than 50% respiratory variability, suggesting right atrial pressure of 3 mmHg. FINDINGS  Left Ventricle: Left ventricular ejection fraction, by estimation, is 60 to 65%. The left ventricle has normal function. The left ventricle has no regional wall motion abnormalities. The left ventricular internal cavity size was normal in size. There is  mild concentric left ventricular hypertrophy. Right Ventricle: The right ventricular size is normal. No increase in right ventricular wall thickness. Right ventricular systolic function is normal. There is normal pulmonary artery systolic pressure. Left Atrium: Left atrial size was normal in size. Right Atrium: Right atrial size was normal in size. Pericardium: There is no evidence of pericardial effusion. Mitral Valve: The mitral valve is normal in structure. Normal mobility of the mitral valve leaflets. Mild mitral valve regurgitation. No evidence of mitral valve stenosis. Tricuspid Valve: The tricuspid valve is normal in structure. Tricuspid valve regurgitation is not demonstrated. No evidence of tricuspid stenosis. Aortic Valve: The aortic valve is normal in structure.. There is severe thickening and severe calcifcation of the aortic valve. Aortic valve regurgitation is moderate. Severe aortic stenosis is present. There is severe thickening of the aortic valve. There is severe calcifcation of the aortic valve. Aortic valve mean gradient measures 37.0 mmHg. Aortic valve peak gradient measures 13.1 mmHg. Aortic valve area, by VTI measures 1.75 cm. Pulmonic Valve: The pulmonic valve was normal in structure. Pulmonic valve regurgitation is not visualized. No evidence of pulmonic stenosis. Aorta: The aortic root is normal in size and structure. Venous: The inferior vena cava is normal in size with greater than 50% respiratory variability, suggesting right atrial pressure of 3 mmHg. IAS/Shunts: No atrial level shunt detected by color flow Doppler.  LEFT VENTRICLE PLAX 2D  LVOT diam:     2.00 cm LV SV:         69 LV SV Index:   38 LVOT Area:     3.14 cm  AORTIC VALVE AV Area (Vmax):    1.91 cm AV Area (Vmean):   2.12 cm AV Area (VTI):     1.75 cm AV Vmax:           181.00 cm/s AV Vmean:          125.000 cm/s AV VTI:            0.394 m AV Peak Grad:      13.1 mmHg AV Mean Grad:      37.0 mmHg LVOT Vmax:         110.00 cm/s LVOT Vmean:        84.300 cm/s LVOT VTI:  0.220 m LVOT/AV VTI ratio: 0.56  SHUNTS Systemic VTI:  0.22 m Systemic Diam: 2.00 cm Ena Dawley MD Electronically signed by Ena Dawley MD Signature Date/Time: 09/06/2019/2:39:05 PM    Final    Structural Heart Procedure  Result Date: 09/06/2019 See surgical note for result.   Cardiac Studies    Patient Profile     86 y.o. male with severe AS, s/p TAVR 09/06/19.  Assessment & Plan    1. Severe aortic stenosis: He is now one day post TAVR. BP stable overnight and now soft this am in the setting of a fever and urinary retention. Albumin ordered by Dr. Cyndia Bent this am. Discussed starting Neosynephrine with nursing if needed. Will plan echo this am. Continue ASA alone given history of thrombocytopenia.   2. Fever: Urine culture pending. He has received Vancomycin and Zinacef as part of the operative protocol. WBC elevated in the setting of recent steroid use. Follow fever curve. Will not send blood cultures unless fever persists. Foley catheter in place this am.   3. Urinary retention: Foley in place. He has issues with urinary retention at home per his wife and has had issues in the past after anesthesia. Will leave foley in place today.   4. RBBB/LAFB: EKG this am with sinus with chronic RBBB, LAFB. No pacing required overnight. Will d/c pacemaker today.   For questions or updates, please contact [place] Please consult [email or web address] for contact info under        Signed, Lauree Chandler, MD  09/07/2019, 8:04 AM

## 2019-09-07 NOTE — Progress Notes (Signed)
  Echocardiogram 2D Echocardiogram limited with definity has been performed.  [name] M 09/07/2019, 8:19 AM

## 2019-09-07 NOTE — Progress Notes (Signed)
Contacted MD this morning with findings of patient temperature at 101.7.  WBC's at 18.9.  Patient has had problems with retention through the night as well.  Straight cath performed x 2 and patient has had some retention problems in the passed.  Attempted to have patient stand and void on own multiple times through the night without success.  Discussed with MD and plan to insert foley and send down urinalysis and urine culture.  Tylenol given for patient comfort and being febrile this morning.

## 2019-09-07 NOTE — Anesthesia Postprocedure Evaluation (Signed)
Anesthesia Post Note  Patient: [name]  Procedure(s) Performed: TRANSCATHETER AORTIC VALVE REPLACEMENT, TRANSFEMORAL (N/A Chest) TRANSESOPHAGEAL ECHOCARDIOGRAM (TEE) (N/A )     Patient location during evaluation: PACU Anesthesia Type: MAC Level of consciousness: awake and alert Pain management: pain level controlled Vital Signs Assessment: post-procedure vital signs reviewed and stable Respiratory status: spontaneous breathing, nonlabored ventilation, respiratory function stable and patient connected to nasal cannula oxygen Cardiovascular status: stable and blood pressure returned to baseline Postop Assessment: no apparent nausea or vomiting Anesthetic complications: no   No complications documented.  Last Vitals:  Vitals:   09/07/19 1200 09/07/19 1203  BP: (!) 115/53   Pulse: 78   Resp: 19   Temp:  (!) 38.1 C  SpO2: 93%     Last Pain:  Vitals:   09/07/19 1203  TempSrc: Oral  PainSc:                  Tiajuana Amass

## 2019-09-08 ENCOUNTER — Inpatient Hospital Stay (HOSPITAL_COMMUNITY): Payer: Medicare Other

## 2019-09-08 ENCOUNTER — Encounter: Payer: Self-pay | Admitting: Oncology

## 2019-09-08 DIAGNOSIS — Z952 Presence of prosthetic heart valve: Secondary | ICD-10-CM | POA: Diagnosis not present

## 2019-09-08 DIAGNOSIS — I452 Bifascicular block: Secondary | ICD-10-CM

## 2019-09-08 LAB — CBC
HCT: 32.3 % — ABNORMAL LOW (ref 39.0–52.0)
Hemoglobin: 10.3 g/dL — ABNORMAL LOW (ref 13.0–17.0)
MCH: 25.6 pg — ABNORMAL LOW (ref 26.0–34.0)
MCHC: 31.9 g/dL (ref 30.0–36.0)
MCV: 80.3 fL (ref 80.0–100.0)
Platelets: 73 10*3/uL — ABNORMAL LOW (ref 150–400)
RBC: 4.02 MIL/uL — ABNORMAL LOW (ref 4.22–5.81)
RDW: 17.6 % — ABNORMAL HIGH (ref 11.5–15.5)
WBC: 21.9 10*3/uL — ABNORMAL HIGH (ref 4.0–10.5)
nRBC: 0 % (ref 0.0–0.2)

## 2019-09-08 LAB — URINE CULTURE: Culture: NO GROWTH

## 2019-09-08 LAB — BASIC METABOLIC PANEL
Anion gap: 8 (ref 5–15)
BUN: 21 mg/dL (ref 8–23)
CO2: 21 mmol/L — ABNORMAL LOW (ref 22–32)
Calcium: 8.2 mg/dL — ABNORMAL LOW (ref 8.9–10.3)
Chloride: 105 mmol/L (ref 98–111)
Creatinine, Ser: 1.18 mg/dL (ref 0.61–1.24)
GFR calc Af Amer: >60 mL/min (ref >60)
GFR calc non Af Amer: 53 mL/min — ABNORMAL LOW (ref >60)
Glucose, Bld: 118 mg/dL — ABNORMAL HIGH (ref 70–99)
Potassium: 3.8 mmol/L (ref 3.5–5.1)
Sodium: 134 mmol/L — ABNORMAL LOW (ref 135–145)

## 2019-09-08 MED ORDER — ASPIRIN 81 MG PO CHEW: 81.0000 mg | CHEWABLE_TABLET | Freq: Every day | ORAL | Status: DC

## 2019-09-08 MED ORDER — METOPROLOL TARTRATE 25 MG PO TABS: 12.5000 mg | ORAL_TABLET | Freq: Every day | ORAL | 2 refills | Status: DC

## 2019-09-08 NOTE — Progress Notes (Addendum)
[place] VALVE TEAM  Patient Name: [name] Date of Encounter: 09/08/2019  Primary Cardiologist: Dr. Tamala Julian / Dr. Angelena Form & Dr. Cyndia Bent (TAVR)  Hospital Problem List     Principal Problem:   S/P TAVR (transcatheter aortic valve replacement) Active Problems:   CAD (coronary artery disease)   GERD (gastroesophageal reflux disease)   Hypertension   Dyslipidemia   Colon cancer (HCC)   Sensorineural hearing loss (SNHL) of both ears   Chronic ITP (idiopathic thrombocytopenia) (HCC)   Severe aortic stenosis   Acute on chronic diastolic heart failure (HCC)   Bifascicular block     Subjective   No complaints. Feeling well. Has been up walking with no issues.   Inpatient Medications    Scheduled Meds:  aspirin  81 mg Oral Daily   atorvastatin  20 mg Oral q1800   Chlorhexidine Gluconate Cloth  6 each Topical Daily   pantoprazole  40 mg Oral Daily   predniSONE  40 mg Oral Q breakfast   sertraline  50 mg Oral QPM   sodium chloride flush  3 mL Intravenous Q12H   tamsulosin  0.8 mg Oral QPC supper   Continuous Infusions:  sodium chloride Stopped (09/08/19 0615)   cefUROXime (ZINACEF)  IV Stopped (09/07/19 2211)   lactated ringers     nitroGLYCERIN Stopped (09/07/19 1600)   phenylephrine (NEO-SYNEPHRINE) Adult infusion Stopped (09/07/19 1734)   PRN Meds: sodium chloride, acetaminophen **OR** acetaminophen, ondansetron (ZOFRAN) IV, oxyCODONE, sodium chloride flush, traMADol   Vital Signs    Vitals:   09/08/19 0700 09/08/19 0753 09/08/19 0800 09/08/19 0900  BP: 139/67  (!) 131/59 132/73  Pulse: 73  79 67  Resp: 19  17 19   Temp:  98.5 F (36.9 C)    TempSrc:  Oral    SpO2: 94%  96% 97%  Weight:      Height:        Intake/Output Summary (Last 24 hours) at 09/08/2019 1020 Last data filed at 09/08/2019 1000 Gross per 24 hour  Intake 908.09 ml  Output 1475 ml  Net -566.91 ml   Filed Weights   09/06/19 0911 09/07/19 0420  09/08/19 0600  Weight: 76.7 kg 77.2 kg 77.7 kg    Physical Exam    GEN: Well nourished, well developed, in no acute distress.  HEENT: Grossly normal.  Neck: Supple, no JVD, carotid bruits, or masses. Cardiac: RRR, no murmurs, rubs, or gallops. No clubbing, cyanosis, edema.   Respiratory:  Respirations regular and unlabored, clear to auscultation bilaterally. GI: Soft, nontender, nondistended, BS + x 4. MS: no deformity or atrophy. Skin: warm and dry, no rash.  Groin sites clear without hematoma. There is diffuse ecchymosis bilaterally  Neuro:  Strength and sensation are intact. Psych: AAOx3.  Normal affect.  Labs    CBC Recent Labs    09/07/19 0237 09/08/19 0548  WBC 18.9* 21.9*  HGB 12.4* 10.3*  HCT 38.4* 32.3*  MCV 80.0 80.3  PLT 99* 73*   Basic Metabolic Panel Recent Labs    09/07/19 0237 09/08/19 0548  NA 135 134*  K 4.6 3.8  CL 104 105  CO2 20* 21*  GLUCOSE 106* 118*  BUN 23 21  CREATININE 1.28* 1.18  CALCIUM 8.3* 8.2*  MG 1.7  --    Liver Function Tests No results for input(s): AST, ALT, ALKPHOS, BILITOT, PROT, ALBUMIN in the last 72 hours. No results for input(s): LIPASE, AMYLASE in the last 72 hours.  Cardiac Enzymes No results for input(s): CKTOTAL, CKMB, CKMBINDEX, TROPONINI in the last 72 hours. BNP Invalid input(s): POCBNP D-Dimer No results for input(s): DDIMER in the last 72 hours. Hemoglobin A1C No results for input(s): HGBA1C in the last 72 hours. Fasting Lipid Panel No results for input(s): CHOL, HDL, LDLCALC, TRIG, CHOLHDL, LDLDIRECT in the last 72 hours. Thyroid Function Tests No results for input(s): TSH, T4TOTAL, T3FREE, THYROIDAB in the last 72 hours.  Invalid input(s): FREET3  Telemetry    Sinus with frequent PVCs - Personally Reviewed  ECG    Sinus with stable bifascicular block ( RBBB, LAFB) HR 80 - Personally Reviewed  Radiology    DG Chest Port 1 View  Result Date: 09/06/2019 CLINICAL DATA:  Status post TAVR EXAM:  PORTABLE CHEST 1 VIEW COMPARISON:  09/02/2019 FINDINGS: Status post TAVR. There is a right IJ approach sheath with a pacer lead terminating in the right ventricle. Left chest mass is unchanged. No focal airspace consolidation or pleural effusion. IMPRESSION: Status post TAVR without focal airspace disease. Electronically Signed   By: Ulyses Jarred M.D.   On: 09/06/2019 19:07   ECHOCARDIOGRAM LIMITED  Result Date: 09/07/2019    ECHOCARDIOGRAM REPORT   Patient Name:   [name] Date of Exam: 09/07/2019 Medical Rec #:  [id number]     Height:       64.0 in Accession #:    [id number]    Weight:       170.3 lb Date of Birth:  May 11, 1923    BSA:          1.827 m Patient Age:    35 years      BP:           80/50 mmHg Patient Gender: M             HR:           74 bpm. Exam Location:  Inpatient Procedure: Limited Echo, Intracardiac Opacification Agent, Limited Color Doppler            and Cardiac Doppler Indications:    Post TAVR evaluation V43.3 / Z95.2  History:        Patient has prior history of Echocardiogram examinations, most                 recent 09/06/2019. CAD, Arrythmias:RBBB; Risk Factors:Dyslipidemia                 and Sleep Apnea. History of cancer. GERD. Fever. Urinary                 retention.                 Aortic Valve: 26 mm Ultra, stented (TAVR) valve is present in                 the aortic position. Procedure Date: 09/06/2019.  Sonographer:    Darlina Sicilian RDCS Referring Phys: [id number] [place]  Sonographer Comments: Gradients obtained when hypotensive IMPRESSIONS  1. Day 1 post TAVR with normal transaortic gradients with peak/mean 18/11 mmHg. Trivial paravalvular leak.  2. Left ventricular ejection fraction, by estimation, is 65 to 70%. The left ventricle has normal function. The left ventricle has no regional wall motion abnormalities. Left ventricular diastolic function could not be evaluated.  3. Right ventricular systolic function is normal. The right ventricular size is normal.  There is normal pulmonary artery systolic pressure.  4. The mitral valve is normal in structure.  Trivial mitral valve regurgitation. No evidence of mitral stenosis.  5. The aortic valve has been repaired/replaced. Aortic valve regurgitation is not visualized. No aortic stenosis is present. There is a 26 mm Ultra, stented (TAVR) valve present in the aortic position. Procedure Date: 09/06/2019. Echo findings are consistent with normal structure and function of the aortic valve prosthesis. Aortic valve mean gradient measures 11.0 mmHg.  6. The inferior vena cava is normal in size with greater than 50% respiratory variability, suggesting right atrial pressure of 3 mmHg. FINDINGS  Left Ventricle: Left ventricular ejection fraction, by estimation, is 65 to 70%. The left ventricle has normal function. The left ventricle has no regional wall motion abnormalities. Definity contrast agent was given IV to delineate the left ventricular  endocardial borders. The left ventricular internal cavity size was normal in size. There is no left ventricular hypertrophy. Left ventricular diastolic function could not be evaluated. Right Ventricle: The right ventricular size is normal. No increase in right ventricular wall thickness. Right ventricular systolic function is normal. There is normal pulmonary artery systolic pressure. Left Atrium: Left atrial size was normal in size. Right Atrium: Right atrial size was normal in size. Pericardium: Trivial pericardial effusion is present. There is no evidence of cardiac tamponade. Mitral Valve: The mitral valve is normal in structure. Normal mobility of the mitral valve leaflets. Trivial mitral valve regurgitation. No evidence of mitral valve stenosis. Tricuspid Valve: The tricuspid valve is normal in structure. Tricuspid valve regurgitation is trivial. No evidence of tricuspid stenosis. Aortic Valve: The aortic valve has been repaired/replaced. Aortic valve regurgitation is not visualized. No  aortic stenosis is present. Aortic valve mean gradient measures 11.0 mmHg. Aortic valve peak gradient measures 18.2 mmHg. Aortic valve area, by VTI measures 3.75 cm. There is a 26 mm Ultra, stented (TAVR) valve present in the aortic position. Procedure Date: 09/06/2019. Echo findings are consistent with normal structure and function of the aortic valve prosthesis. Pulmonic Valve: The pulmonic valve was normal in structure. Pulmonic valve regurgitation is not visualized. No evidence of pulmonic stenosis. Aorta: The aortic root is normal in size and structure. Venous: The inferior vena cava is normal in size with greater than 50% respiratory variability, suggesting right atrial pressure of 3 mmHg. IAS/Shunts: No atrial level shunt detected by color flow Doppler.  LEFT VENTRICLE PLAX 2D LVOT diam:     2.35 cm LV SV:         102 LV SV Index:   56 LVOT Area:     4.34 cm  AORTIC VALVE AV Area (Vmax):    3.38 cm AV Area (Vmean):   3.56 cm AV Area (VTI):     3.75 cm AV Vmax:           213.33 cm/s AV Vmean:          139.000 cm/s AV VTI:            0.272 m AV Peak Grad:      18.2 mmHg AV Mean Grad:      11.0 mmHg LVOT Vmax:         166.00 cm/s LVOT Vmean:        114.000 cm/s LVOT VTI:          0.235 m LVOT/AV VTI ratio: 0.87  SHUNTS Systemic VTI:  0.24 m Systemic Diam: 2.35 cm Ena Dawley MD Electronically signed by Ena Dawley MD Signature Date/Time: 09/07/2019/10:32:21 PM    Final    ECHOCARDIOGRAM LIMITED  Result Date: 09/06/2019  ECHOCARDIOGRAM LIMITED REPORT   Patient Name:   [name] Date of Exam: 09/06/2019 Medical Rec #:  [id number]     Height:       64.0 in Accession #:    [id number]    Weight:       170.8 lb Date of Birth:  1923/09/16    BSA:          1.829 m Patient Age:    42 years      BP:           207/88 mmHg Patient Gender: M             HR:           52 bpm. Exam Location:  Inpatient Procedure: Limited Echo, Color Doppler and Cardiac Doppler Indications:     Aortic Stenosis i35.0  History:          Patient has prior history of Echocardiogram examinations, most                  recent 06/13/2019. CAD; Risk Factors:Hypertension and                  Dyslipidemia.  Sonographer:     Raquel Sarna Senior RDCS Referring Phys:  [place] Diagnosing Phys: Ena Dawley MD  Sonographer Comments: TAVR Procedure for implantation of 29mm Edwards Ultra Sapien Bioprosthetic IMPRESSIONS  1. This was a periprocedural TTE during a TAVR procedure. A 26 mm Edwards-SAPIEN 3 Ultra valve was successfully deployed in the aortic position with improvement of peak/mean transaortic gradients from 60/37 mmHg to 13/7 mmHg. No paravalvular leak and no  pericardial effusion prior or post procedure.  2. Left ventricular ejection fraction, by estimation, is 60 to 65%. The left ventricle has normal function. The left ventricle has no regional wall motion abnormalities. There is mild concentric left ventricular hypertrophy.  3. Right ventricular systolic function is normal. The right ventricular size is normal. There is normal pulmonary artery systolic pressure.  4. The mitral valve is normal in structure. Mild mitral valve regurgitation. No evidence of mitral stenosis.  5. The aortic valve is normal in structure. Aortic valve regurgitation is moderate. Severe aortic valve stenosis. Aortic valve mean gradient measures 37.0 mmHg.  6. The inferior vena cava is normal in size with greater than 50% respiratory variability, suggesting right atrial pressure of 3 mmHg. FINDINGS  Left Ventricle: Left ventricular ejection fraction, by estimation, is 60 to 65%. The left ventricle has normal function. The left ventricle has no regional wall motion abnormalities. The left ventricular internal cavity size was normal in size. There is  mild concentric left ventricular hypertrophy. Right Ventricle: The right ventricular size is normal. No increase in right ventricular wall thickness. Right ventricular systolic function is normal. There is  normal pulmonary artery systolic pressure. Left Atrium: Left atrial size was normal in size. Right Atrium: Right atrial size was normal in size. Pericardium: There is no evidence of pericardial effusion. Mitral Valve: The mitral valve is normal in structure. Normal mobility of the mitral valve leaflets. Mild mitral valve regurgitation. No evidence of mitral valve stenosis. Tricuspid Valve: The tricuspid valve is normal in structure. Tricuspid valve regurgitation is not demonstrated. No evidence of tricuspid stenosis. Aortic Valve: The aortic valve is normal in structure.. There is severe thickening and severe calcifcation of the aortic valve. Aortic valve regurgitation is moderate. Severe aortic stenosis is present. There is severe thickening of the aortic valve. There is severe calcifcation  of the aortic valve. Aortic valve mean gradient measures 37.0 mmHg. Aortic valve peak gradient measures 13.1 mmHg. Aortic valve area, by VTI measures 1.75 cm. Pulmonic Valve: The pulmonic valve was normal in structure. Pulmonic valve regurgitation is not visualized. No evidence of pulmonic stenosis. Aorta: The aortic root is normal in size and structure. Venous: The inferior vena cava is normal in size with greater than 50% respiratory variability, suggesting right atrial pressure of 3 mmHg. IAS/Shunts: No atrial level shunt detected by color flow Doppler.  LEFT VENTRICLE PLAX 2D LVOT diam:     2.00 cm LV SV:         69 LV SV Index:   38 LVOT Area:     3.14 cm  AORTIC VALVE AV Area (Vmax):    1.91 cm AV Area (Vmean):   2.12 cm AV Area (VTI):     1.75 cm AV Vmax:           181.00 cm/s AV Vmean:          125.000 cm/s AV VTI:            0.394 m AV Peak Grad:      13.1 mmHg AV Mean Grad:      37.0 mmHg LVOT Vmax:         110.00 cm/s LVOT Vmean:        84.300 cm/s LVOT VTI:          0.220 m LVOT/AV VTI ratio: 0.56  SHUNTS Systemic VTI:  0.22 m Systemic Diam: 2.00 cm Ena Dawley MD Electronically signed by Ena Dawley MD  Signature Date/Time: 09/06/2019/2:39:05 PM    Final    Structural Heart Procedure  Result Date: 09/06/2019 See surgical note for result.   Cardiac Studies   TAVR OPERATIVE NOTE     Date of Procedure:                09/06/2019   Preoperative Diagnosis:      Severe Aortic Stenosis    Postoperative Diagnosis:    Same    Procedure:        Transcatheter Aortic Valve Replacement - Percutaneous Right Transfemoral Approach             Edwards Sapien 3 Ultra THV (size 26 mm, model # 9750TFX, serial # [id number])              Co-Surgeons:                        Gaye Pollack, MD  /  Lauree Chandler, MD     Anesthesiologist:                  Deatra Canter, MD   Echocardiographer:              Liane Comber, MD   Pre-operative Echo Findings: Severe aortic stenosis Normal left ventricular systolic function   Post-operative Echo Findings: No paravalvular leak Normal left ventricular systolic function  ____________________  Echo 09/07/19: IMPRESSIONS   1. Day 1 post TAVR with normal transaortic gradients with peak/mean 18/11 mmHg. Trivial paravalvular leak.   2. Left ventricular ejection fraction, by estimation, is 65 to 70%. The  left ventricle has normal function. The left ventricle has no regional  wall motion abnormalities. Left ventricular diastolic function could not  be evaluated.   3. Right ventricular systolic function is normal. The right ventricular  size is normal. There is normal pulmonary artery systolic  pressure.   4. The mitral valve is normal in structure. Trivial mitral valve  regurgitation. No evidence of mitral stenosis.   5. The aortic valve has been repaired/replaced. Aortic valve  regurgitation is not visualized. No aortic stenosis is present. There is a  26 mm Ultra, stented (TAVR) valve present in the aortic position.  Procedure Date: 09/06/2019. Echo findings are  consistent with normal structure and function of the aortic valve  prosthesis. Aortic valve  mean gradient measures 11.0 mmHg.   6. The inferior vena cava is normal in size with greater than 50%  respiratory variability, suggesting right atrial pressure of 3 mmHg.     Patient Profile   AKITO BOOMHOWER is a 86 y.o. male with a history of idiopathic thrombocytopenia (Rx'd with Nplate and steroids), bifasicular block (RBBB, LAFB), anemia, anxiety, CAD, colon cancer, HLD, GERD, hairy cell leukemia, HTN and severe aortic stenosis who presented to [facility] on 09/06/19 for planned TAVR.   Assessment & Plan    Severe AS: s/p successful TAVR with a 26 mm Edwards Sapien 3 Ultra THV via the TF approach on 09/06/19. Post operative echo showed EF 65-70%, normally functioning TAVR with a mean gradient of 11 mm Hg and no PVL. Groin sites are stable. ECG with sinus with stable bifascicular block (RBBB, LAFB) HR 80 and no high grade heart block. Continue Asprin alone given history of thrombocytopenia.  HTN: BP well controlled.   Fever: had temp up to 101.7. Urine culture negative. CXR with no acute abnormalities. Now resolved. He was treated with IV Vancomycin and Zinacef as part of the operative protocol. WBC elevated in the setting of recent steroid use.  Acute urinary retention with hematuria: suspect hematuria from trauma. Will DC foley and see if he can urinate on his own. Continue Flomax. If he continues to have issues he may need to leave with a catheter in place and follow up with urology for voiding trial.  Conduction disease: has underlying chronic RBBB, LAFB. No pacing required. I pulled his temp wire this morning. Given advanced conduction dz and recent TAVR, will place a Zio patch at DC to monitor for late presenting HAVB.  Thrombocytopenia: PLTs stable around ~70. Followed closely by Dr. Ammie Dalton.   Will see how he does trying to urinate with hopeful DC later today.    Signed, Angelena Form, PA-C  09/08/2019, 10:20 AM  Pager [phone]  I have personally seen and examined this patient. I  agree with the assessment and plan as outlined above.  He is doing well this am. Foley removed. We will follow to see if he can void. If not, will need urology consult for foley prior to discharge.  No heart block. Pacemaker out this am. Continue ASA.  Probable discharge home today.   Lauree Chandler 09/08/2019 11:16 AM

## 2019-09-08 NOTE — Progress Notes (Signed)
Zio patch placed onto patient.  All instructions and information reviewed with patient, they verbalize understanding with no questions. 

## 2019-09-08 NOTE — Progress Notes (Signed)
CARDIAC REHAB PHASE I   PRE:  Rate/Rhythm: 79 SR    BP: sitting 145/75    SaO2: 97 RA  MODE:  Ambulation: 370 ft   POST:  Rate/Rhythm: 100 ST    BP: sitting 184/84     SaO2: 99 RA  Pt able to stand independently and walk with RW. Increased distance, SOB and fatigue at end. Pt then to BR and stood for 5 min, produced a few drops of blood and eventually a few drops of urine. To bed. Discussed restrictions, walking and CRPII. Pt loved CRPII previously and we will refer to [place], ACSM 09/08/2019 11:55 AM

## 2019-09-08 NOTE — Progress Notes (Signed)
Temporary transvenous pacing wire removed by Nell Range PA-C at 0930.

## 2019-09-08 NOTE — Progress Notes (Signed)
  [facility]   MULTIDISCIPLINARY HEART VALVE TEAM  Pt still unable to void. Discussed with Dr. Milford Cage with urology. Bladder scan only showed 287 ml. He doesn't feel like repeat foley is indicated at this time. Will keep him overnight to make sure he can void before discharging home. He does follow with a urologist at [facility]. Will transfer to 4E.  Angelena Form PA-C  [facility]

## 2019-09-08 NOTE — Discharge Instructions (Signed)
ACTIVITY AND EXERCISE °• Daily activity and exercise are an important part of your recovery. People recover at different rates depending on their general health and type of valve procedure. °• Most people recovering from TAVR feel better relatively quickly  °• No lifting, pushing, pulling more than 10 pounds (examples to avoid: groceries, vacuuming, gardening, golfing): °            - For one week with a procedure through the groin. °            - For six weeks for procedures through the chest wall or neck. °NOTE: You will typically see one of our providers 7-14 days after your procedure to discuss WHEN TO RESUME the above activities.  °  °  °DRIVING °• Do not drive until you are seen for follow up and cleared by a provider. Generally, we ask patient to not drive for 1 week after their procedure. °• If you have been told by your doctor in the past that you may not drive, you must talk with him/her before you begin driving again. °  °DRESSING °• Groin site: you may leave the clear dressing over the site for up to one week or until it falls off. °  °HYGIENE °• If you had a femoral (leg) procedure, you may take a shower when you return home. After the shower, pat the site dry. Do NOT use powder, oils or lotions in your groin area until the site has completely healed. °• If you had a chest procedure, you may shower when you return home unless specifically instructed not to by your discharging practitioner. °            - DO NOT scrub incision; pat dry with a towel. °            - DO NOT apply any lotions, oils, powders to the incision. °            - No tub baths / swimming for at least 2 weeks. °• If you notice any fevers, chills, increased pain, swelling, bleeding or pus, please contact your doctor. °  °ADDITIONAL INFORMATION °• If you are going to have an upcoming dental procedure, please contact our office as you will require antibiotics ahead of time to prevent infection on your heart valve.  ° ° °If you have any  questions or concerns you can call the structural heart phone during normal business hours 8am-4pm. If you have an urgent need after hours or weekends please call [phone] to talk to the on call provider for general cardiology. If you have an emergency that requires immediate attention, please call 911.  ° ° °After TAVR Checklist ° °Check  Test Description  ° Follow up appointment in 1-2 weeks  You will see our structural heart physician assistant, [name]. Your incision sites will be checked and you will be cleared to drive and resume all normal activities if you are doing well.    ° 1 month echo and follow up  You will have an echo to check on your new heart valve and be seen back in the office by [name]. Many times the echo is not read by your appointment time, but Douglas will call you later that day or the following day to report your results.  ° Follow up with your primary cardiologist You will need to be seen by your primary cardiologist in the following 3-6 months after your 1 month appointment in the valve   clinic. Often times your Plavix or Aspirin will be discontinued during this time, but this is decided on a case by case basis.   ° 1 year echo and follow up You will have another echo to check on your heart valve after 1 year and be seen back in the office by [name]. This your last structural heart visit.  ° Bacterial endocarditis prophylaxis  You will have to take antibiotics for the rest of your life before all dental procedures (even teeth cleanings) to protect your heart valve. Antibiotics are also required before some surgeries. Please check with your cardiologist before scheduling any surgeries. Also, please make sure to tell us if you have a penicillin allergy as you will require an alternative antibiotic.   ° ° °

## 2019-09-08 NOTE — Discharge Summary (Addendum)
[place] [facility] [facility]VE TEAM  Discharge Summary    Patient ID: [name] MRN: [id number]; DOB: [birth year]  Admit date: 09/06/2019 Discharge date: 09/09/2019  Primary Care Provider: [name], MD  Primary Cardiologist: Sinclair Grooms, MD / Dr. Angelena Form & Dr. Cyndia Bent (TAVR)  Discharge Diagnoses    Principal Problem:   S/P TAVR (transcatheter aortic valve replacement) Active Problems:   CAD (coronary artery disease)   GERD (gastroesophageal reflux disease)   Hypertension   Dyslipidemia   Colon cancer (HCC)   Sensorineural hearing loss (SNHL) of both ears   Chronic ITP (idiopathic thrombocytopenia) (HCC)   Severe aortic stenosis   Acute on chronic diastolic heart failure (HCC)   Bifascicular block   Allergies Allergies  Allergen Reactions   Antazoline Anaphylaxis   Antihistamines, Chlorpheniramine-Type     Nervous, jittery   Sulfa Antibiotics Rash    Diagnostic Studies/Procedures    TAVR OPERATIVE NOTE   Date of Procedure:09/06/2019  Preoperative Diagnosis:Severe Aortic Stenosis   Postoperative Diagnosis:Same   Procedure:   Transcatheter Aortic Valve Replacement - PercutaneousRightTransfemoral Approach Edwards Sapien 3 Ultra THV (size 95mm, model # [id number], serial # [id number])  Co-Surgeons:Bryan Alveria Apley, MD / Lauree Chandler, MD   Anesthesiologist:R. Ola Spurr, MD  Anne Hahn, MD  Pre-operative Echo Findings: ? Severe aortic stenosis ? Normalleft ventricular systolic function  Post-operative Echo Findings: ? Noparavalvular leak ? Normalleft ventricular systolic function  ____________________   Echo 09/07/19: IMPRESSIONS  1. Day 1 post TAVR with normal transaortic gradients with peak/mean 18/11 mmHg. Trivial paravalvular leak.  2. Left ventricular  ejection fraction, by estimation, is 65 to 70%. The  left ventricle has normal function. The left ventricle has no regional  wall motion abnormalities. Left ventricular diastolic function could not  be evaluated.  3. Right ventricular systolic function is normal. The right ventricular  size is normal. There is normal pulmonary artery systolic pressure.  4. The mitral valve is normal in structure. Trivial mitral valve  regurgitation. No evidence of mitral stenosis.  5. The aortic valve has been repaired/replaced. Aortic valve  regurgitation is not visualized. No aortic stenosis is present. There is a  26 mm Ultra, stented (TAVR) valve present in the aortic position.  Procedure Date: 09/06/2019. Echo findings are  consistent with normal structure and function of the aortic valve  prosthesis. Aortic valve mean gradient measures 11.0 mmHg.  6. The inferior vena cava is normal in size with greater than 50%  respiratory variability, suggesting right atrial pressure of 3 mmHg.      History of Present Illness     [name] is a 86 y.o. male with a history of idiopathic thrombocytopenia (Rxd with Nplate and steroids),bifasicular block (RBBB, LAFB), anemia, anxiety, CAD, colon cancer, HLD, GERD, hairy cell leukemia, HTN and severe aortic stenosis who presented to [facility] on [facility]06[facility]19 for planned TAVR  He has been followed by Dr. Tamala Julian with what is felt to be a moderate aortic stenosis.  An echocardiogram in October 2020 showed a mean gradient of 32 mmHg with a peak gradient of 55 mmHg and a valve area of 0.85 cm.  Left ventricular ejection fraction was 60 to 65%.  He now presents with progressive exertional fatigue and shortness of breath which he says has been going on for several years but recently getting worse to the point where he gets fatigue and shortness of breath with going up stairs or an incline.  He has had generalized  weakness in his legs with any exercise. Repeat echocardiogram on  06/13/2019 showed further progression of his aortic stenosis with a mean gradient of 43 mmHg and a peak gradient of 67 mmHg.  Aortic valve area by VTI was 0.76 cm with a dimensionless index of 0.24 consistent with severe aortic stenosis.  Left ventricular ejection fraction remained 60 to 65% with grade 1 diastolic dysfunction. L/RHC on 07/26/19 showed chronically occluded mid LAD, moderate disease in the diagonals and mid RCA. Medical therapy recommended for CAD.  The patient has been evaluated by the multidisciplinary valve team and felt to have severe, symptomatic aortic stenosis and to be a suitable candidate for TAVR, which was set up for 08/16/19. However, this was later cancelled given worsening of his chronic thrombocytopenia with a platelet count down to 11. He has been working closely with Dr. Learta Codding and treated with N-plate and steroids with improvement in platelets. His TAVR was rescheduled to 09/06/19.   Hospital Course     Consultants: none  Severe AS:s/p successful TAVR with a 26 mm Edwards Sapien 3 Ultra THV via the TF approach on 09/06/19. Post operative echo showed EF 65-70%, normally functioning TAVR with a mean gradient of 11 mm Hg and no PVL. Groin sites are stable. ECG with sinus with stable bifascicular block (RBBB, LAFB) HR 80 and no high grade heart block. Continue Asprin alone given history of thrombocytopenia. Plan for DC home today with close outpatient follow up next week.   HTN: BP well controlled. Resume home meds.  Fever: had temp up to 101.7. Urine culture negative. CXR with no acute abnormalities. Now resolved. He was treated with IV Vancomycin and Zinacef as part of the operative protocol. WBC elevated in the setting of recent steroid use.  Acute urinary retention with hematuria: suspect hematuria from trauma. He continued to have issues with urinary retention and will be discharged with a foley catheter in place. I have reached out to [facility] who will contact  him to get him in with their office over the next week or so for a voiding trial.   Conduction disease: has underlying chronic RBBB, LAFB. No pacing required. Temp wire pulled 09/08/19 AM. Given advanced conduction dz and recent TAVR, will place a Zio patch at DC to monitor for late presenting HAVB.  Thrombocytopenia: PLTs trending down around ~50. Followed closely by Dr. Ammie Dalton.   Acute on chronic diastolic CHF: as evidenced by an elevated BNP on pre admission lab work. This has been treated with TAVR.    _____________  Discharge Vitals Blood pressure 122/74, pulse 65, temperature 98.4 F (36.9 C), temperature source Oral, resp. rate 18, height 5\' 4"  (1.626 m), weight 78 kg, SpO2 95 %.  Filed Weights   09/07/19 0420 09/08/19 0600 09/09/19 0500  Weight: 77.2 kg 77.7 kg 78 kg   GEN: Well nourished, well developed, in no acute distress.  HEENT: Grossly normal.  Neck: Supple, no JVD, carotid bruits, or masses. Cardiac: RRR, no murmurs, rubs, or gallops. No clubbing, cyanosis, edema.   Respiratory:  Respirations regular and unlabored, clear to auscultation bilaterally. GI: Soft, nontender, nondistended, BS + x 4. MS: no deformity or atrophy. Skin: warm and dry, no rash.  Groin sites clear without hematoma. There is diffuse ecchymosis bilaterally  Neuro:  Strength and sensation are intact. Psych: AAOx3.  Normal affect.  Labs & Radiologic Studies    CBC Recent Labs    09/08/19 0548 09/09/19 0218  WBC 21.9* 26.0*  HGB 10.3*  11.0*  HCT 32.3* 34.1*  MCV 80.3 79.5*  PLT 73* 51*   Basic Metabolic Panel Recent Labs    09/07/19 0237 09/07/19 0237 09/08/19 0548 09/09/19 0218  NA 135   < > 134* 133*  K 4.6   < > 3.8 4.0  CL 104   < > 105 104  CO2 20*   < > 21* 21*  GLUCOSE 106*   < > 118* 176*  BUN 23   < > 21 22  CREATININE 1.28*   < > 1.18 1.01  CALCIUM 8.3*   < > 8.2* 8.5*  MG 1.7  --   --   --    < > = values in this interval not displayed.   Liver Function Tests No  results for input(s): AST, ALT, ALKPHOS, BILITOT, PROT, ALBUMIN in the last 72 hours. No results for input(s): LIPASE, AMYLASE in the last 72 hours. Cardiac Enzymes No results for input(s): CKTOTAL, CKMB, CKMBINDEX, TROPONINI in the last 72 hours. BNP Invalid input(s): POCBNP D-Dimer No results for input(s): DDIMER in the last 72 hours. Hemoglobin A1C No results for input(s): HGBA1C in the last 72 hours. Fasting Lipid Panel No results for input(s): CHOL, HDL, LDLCALC, TRIG, CHOLHDL, LDLDIRECT in the last 72 hours. Thyroid Function Tests No results for input(s): TSH, T4TOTAL, T3FREE, THYROIDAB in the last 72 hours.  Invalid input(s): FREET3 _____________  DG Chest 2 View  Result Date: 09/02/2019 CLINICAL DATA:  Preop evaluation for upcoming aortic surgery EXAM: CHEST - 2 VIEW COMPARISON:  08/12/2019 FINDINGS: Cardiac shadow is mildly enlarged. Stable left upper lobe lung mass is noted. No focal infiltrate is seen. Degenerative changes of the thoracic spine are noted. IMPRESSION: Stable left upper lobe mass.  No new focal abnormality is noted. Electronically Signed   By: Inez Catalina M.D.   On: 09/02/2019 15:34   DG Chest 2 View  Result Date: 08/12/2019 CLINICAL DATA:  Preoperative evaluation for aortic valve replacement EXAM: CHEST - 2 VIEW COMPARISON:  CT angiogram chest Jul 22, 2019 FINDINGS: Pulmonary nodular lesion in the left mid lung again noted measuring 3.8 x 3.4 cm. Lungs elsewhere clear. Heart size and pulmonary vascularity are normal. No adenopathy. There is degenerative change in the thoracic spine. There are surgical clips in the right axilla left upper abdomen. IMPRESSION: Nodular opacity again noted left mid lung measuring 3.8 x 3.4 cm. Recommendation for PET-CT examination remains in effect. Lungs elsewhere clear. Cardiac silhouette normal. No adenopathy. These results will be called to the ordering clinician or representative by the Radiologist Assistant, and communication  documented in the PACS or [employer]. Electronically Signed   By: Lowella Grip III M.D.   On: 08/12/2019 15:17   DG Chest Port 1 View  Result Date: 09/06/2019 CLINICAL DATA:  Status post TAVR EXAM: PORTABLE CHEST 1 VIEW COMPARISON:  09/02/2019 FINDINGS: Status post TAVR. There is a right IJ approach sheath with a pacer lead terminating in the right ventricle. Left chest mass is unchanged. No focal airspace consolidation or pleural effusion. IMPRESSION: Status post TAVR without focal airspace disease. Electronically Signed   By: Ulyses Jarred M.D.   On: 09/06/2019 19:07   ECHOCARDIOGRAM LIMITED  Result Date: 09/07/2019    ECHOCARDIOGRAM REPORT   Patient Name:   MAC DOWDELL Date of Exam: 09/07/2019 Medical Rec #:  [id number]     Height:       64.0 in Accession #:    [id number]    Weight:  170.3 lb Date of Birth:  10-01-1923    BSA:          1.827 m Patient Age:    27 years      BP:           80/50 mmHg Patient Gender: M             HR:           74 bpm. Exam Location:  Inpatient Procedure: Limited Echo, Intracardiac Opacification Agent, Limited Color Doppler            and Cardiac Doppler Indications:    Post TAVR evaluation V43.3 / Z95.2  History:        Patient has prior history of Echocardiogram examinations, most                 recent 09/06/2019. CAD, Arrythmias:RBBB; Risk Factors:Dyslipidemia                 and Sleep Apnea. History of cancer. GERD. Fever. Urinary                 retention.                 Aortic Valve: 26 mm Ultra, stented (TAVR) valve is present in                 the aortic position. Procedure Date: 09/06/2019.  Sonographer:    Darlina Sicilian RDCS Referring Phys: [id number] [place]  Sonographer Comments: Gradients obtained when hypotensive IMPRESSIONS  1. Day 1 post TAVR with normal transaortic gradients with peak/mean 18/11 mmHg. Trivial paravalvular leak.  2. Left ventricular ejection fraction, by estimation, is 65 to 70%. The left ventricle has normal function.  The left ventricle has no regional wall motion abnormalities. Left ventricular diastolic function could not be evaluated.  3. Right ventricular systolic function is normal. The right ventricular size is normal. There is normal pulmonary artery systolic pressure.  4. The mitral valve is normal in structure. Trivial mitral valve regurgitation. No evidence of mitral stenosis.  5. The aortic valve has been repaired/replaced. Aortic valve regurgitation is not visualized. No aortic stenosis is present. There is a 26 mm Ultra, stented (TAVR) valve present in the aortic position. Procedure Date: 09/06/2019. Echo findings are consistent with normal structure and function of the aortic valve prosthesis. Aortic valve mean gradient measures 11.0 mmHg.  6. The inferior vena cava is normal in size with greater than 50% respiratory variability, suggesting right atrial pressure of 3 mmHg. FINDINGS  Left Ventricle: Left ventricular ejection fraction, by estimation, is 65 to 70%. The left ventricle has normal function. The left ventricle has no regional wall motion abnormalities. Definity contrast agent was given IV to delineate the left ventricular  endocardial borders. The left ventricular internal cavity size was normal in size. There is no left ventricular hypertrophy. Left ventricular diastolic function could not be evaluated. Right Ventricle: The right ventricular size is normal. No increase in right ventricular wall thickness. Right ventricular systolic function is normal. There is normal pulmonary artery systolic pressure. Left Atrium: Left atrial size was normal in size. Right Atrium: Right atrial size was normal in size. Pericardium: Trivial pericardial effusion is present. There is no evidence of cardiac tamponade. Mitral Valve: The mitral valve is normal in structure. Normal mobility of the mitral valve leaflets. Trivial mitral valve regurgitation. No evidence of mitral valve stenosis. Tricuspid Valve: The tricuspid valve  is normal in structure. Tricuspid valve  regurgitation is trivial. No evidence of tricuspid stenosis. Aortic Valve: The aortic valve has been repaired/replaced. Aortic valve regurgitation is not visualized. No aortic stenosis is present. Aortic valve mean gradient measures 11.0 mmHg. Aortic valve peak gradient measures 18.2 mmHg. Aortic valve area, by VTI measures 3.75 cm. There is a 26 mm Ultra, stented (TAVR) valve present in the aortic position. Procedure Date: 09/06/2019. Echo findings are consistent with normal structure and function of the aortic valve prosthesis. Pulmonic Valve: The pulmonic valve was normal in structure. Pulmonic valve regurgitation is not visualized. No evidence of pulmonic stenosis. Aorta: The aortic root is normal in size and structure. Venous: The inferior vena cava is normal in size with greater than 50% respiratory variability, suggesting right atrial pressure of 3 mmHg. IAS/Shunts: No atrial level shunt detected by color flow Doppler.  LEFT VENTRICLE PLAX 2D LVOT diam:     2.35 cm LV SV:         102 LV SV Index:   56 LVOT Area:     4.34 cm  AORTIC VALVE AV Area (Vmax):    3.38 cm AV Area (Vmean):   3.56 cm AV Area (VTI):     3.75 cm AV Vmax:           213.33 cm/s AV Vmean:          139.000 cm/s AV VTI:            0.272 m AV Peak Grad:      18.2 mmHg AV Mean Grad:      11.0 mmHg LVOT Vmax:         166.00 cm/s LVOT Vmean:        114.000 cm/s LVOT VTI:          0.235 m LVOT/AV VTI ratio: 0.87  SHUNTS Systemic VTI:  0.24 m Systemic Diam: 2.35 cm Ena Dawley MD Electronically signed by Ena Dawley MD Signature Date/Time: 09/07/2019/10:32:21 PM    Final    ECHOCARDIOGRAM LIMITED  Result Date: 09/06/2019    ECHOCARDIOGRAM LIMITED REPORT   Patient Name:   [name] Date of Exam: 09/06/2019 Medical Rec #:  [id number]     Height:       64.0 in Accession #:    [id number]    Weight:       170.8 lb Date of Birth:  August 13, 1923    BSA:          1.829 m Patient Age:    49 years      BP:            207/88 mmHg Patient Gender: M             HR:           52 bpm. Exam Location:  Inpatient Procedure: Limited Echo, Color Doppler and Cardiac Doppler Indications:     Aortic Stenosis i35.0  History:         Patient has prior history of Echocardiogram examinations, most                  recent 06/13/2019. CAD; Risk Factors:Hypertension and                  Dyslipidemia.  Sonographer:     Raquel Sarna Senior RDCS Referring Phys:  [place] Diagnosing Phys: Ena Dawley MD  Sonographer Comments: TAVR Procedure for implantation of 58mm Edwards Ultra Sapien Bioprosthetic IMPRESSIONS  1. This was a periprocedural TTE during a TAVR procedure.  A 26 mm Edwards-SAPIEN 3 Ultra valve was successfully deployed in the aortic position with improvement of peak/mean transaortic gradients from 60/37 mmHg to 13/7 mmHg. No paravalvular leak and no  pericardial effusion prior or post procedure.  2. Left ventricular ejection fraction, by estimation, is 60 to 65%. The left ventricle has normal function. The left ventricle has no regional wall motion abnormalities. There is mild concentric left ventricular hypertrophy.  3. Right ventricular systolic function is normal. The right ventricular size is normal. There is normal pulmonary artery systolic pressure.  4. The mitral valve is normal in structure. Mild mitral valve regurgitation. No evidence of mitral stenosis.  5. The aortic valve is normal in structure. Aortic valve regurgitation is moderate. Severe aortic valve stenosis. Aortic valve mean gradient measures 37.0 mmHg.  6. The inferior vena cava is normal in size with greater than 50% respiratory variability, suggesting right atrial pressure of 3 mmHg. FINDINGS  Left Ventricle: Left ventricular ejection fraction, by estimation, is 60 to 65%. The left ventricle has normal function. The left ventricle has no regional wall motion abnormalities. The left ventricular internal cavity size was normal in size. There is   mild concentric left ventricular hypertrophy. Right Ventricle: The right ventricular size is normal. No increase in right ventricular wall thickness. Right ventricular systolic function is normal. There is normal pulmonary artery systolic pressure. Left Atrium: Left atrial size was normal in size. Right Atrium: Right atrial size was normal in size. Pericardium: There is no evidence of pericardial effusion. Mitral Valve: The mitral valve is normal in structure. Normal mobility of the mitral valve leaflets. Mild mitral valve regurgitation. No evidence of mitral valve stenosis. Tricuspid Valve: The tricuspid valve is normal in structure. Tricuspid valve regurgitation is not demonstrated. No evidence of tricuspid stenosis. Aortic Valve: The aortic valve is normal in structure.. There is severe thickening and severe calcifcation of the aortic valve. Aortic valve regurgitation is moderate. Severe aortic stenosis is present. There is severe thickening of the aortic valve. There is severe calcifcation of the aortic valve. Aortic valve mean gradient measures 37.0 mmHg. Aortic valve peak gradient measures 13.1 mmHg. Aortic valve area, by VTI measures 1.75 cm. Pulmonic Valve: The pulmonic valve was normal in structure. Pulmonic valve regurgitation is not visualized. No evidence of pulmonic stenosis. Aorta: The aortic root is normal in size and structure. Venous: The inferior vena cava is normal in size with greater than 50% respiratory variability, suggesting right atrial pressure of 3 mmHg. IAS/Shunts: No atrial level shunt detected by color flow Doppler.  LEFT VENTRICLE PLAX 2D LVOT diam:     2.00 cm LV SV:         69 LV SV Index:   38 LVOT Area:     3.14 cm  AORTIC VALVE AV Area (Vmax):    1.91 cm AV Area (Vmean):   2.12 cm AV Area (VTI):     1.75 cm AV Vmax:           181.00 cm/s AV Vmean:          125.000 cm/s AV VTI:            0.394 m AV Peak Grad:      13.1 mmHg AV Mean Grad:      37.0 mmHg LVOT Vmax:          110.00 cm/s LVOT Vmean:        84.300 cm/s LVOT VTI:          0.220  m LVOT/AV VTI ratio: 0.56  SHUNTS Systemic VTI:  0.22 m Systemic Diam: 2.00 cm Ena Dawley MD Electronically signed by Ena Dawley MD Signature Date/Time: 09/06/2019/2:39:05 PM    Final    Structural Heart Procedure  Result Date: 09/06/2019 See surgical note for result.  Disposition   Pt is being discharged home today in good condition.  Follow-up Plans & Appointments     Follow-up Information    Eileen Stanford, PA-C. Go on 09/14/2019.   Specialties: Cardiology, Radiology Why: @ 3:30pm, please arrive at least 10 minutes early Contact information: [place] [phone] [phone]        [facility] Follow up.   Why: You will leave the hospital with a foley catheter. The [facility] office will call you to get you an apt in the next week or so. Please call them if you do not hear from them Contact information: [place]             Discharge Instructions    Amb Referral to Cardiac Rehabilitation   Complete by: As directed    Diagnosis: Valve Replacement   Valve: Aortic Comment - TAVR   After initial evaluation and assessments completed: Virtual Based Care may be provided alone or in conjunction with Phase 2 Cardiac Rehab based on patient barriers.: Yes      Discharge Medications   Allergies as of 09/09/2019      Reactions   Antazoline Anaphylaxis   Antihistamines, Chlorpheniramine-type    Nervous, jittery   Sulfa Antibiotics Rash      Medication List    TAKE these medications   aspirin 81 MG chewable tablet Chew 1 tablet (81 mg total) by mouth daily.   atorvastatin 20 MG tablet Commonly known as: LIPITOR TAKE 1 TABLET BY MOUTH DAILY AT 6 PM What changed: See the new instructions.   desonide 0.05 % cream Commonly known as: DESOWEN Apply 1 application topically 2 (two)  times daily as needed (rash/irritation.).   fluocinonide 0.05 % external solution Commonly known as: LIDEX Apply 1 application topically 2 (two) times daily as needed (apply to ears).   latanoprost 0.005 % ophthalmic solution Commonly known as: XALATAN Place 1 drop into both eyes at bedtime.   Lucentis 0.5 MG/0.05ML Soln Generic drug: ranibizumab 0.5 mg by Intravitreal route every 28 (twenty-eight) days. Left Eye ONLY   metoprolol tartrate 25 MG tablet Commonly known as: LOPRESSOR Take 0.5 tablets (12.5 mg total) by mouth daily.   nitroGLYCERIN 0.4 MG SL tablet Commonly known as: Nitrostat Place 1 tablet (0.4 mg total) under the tongue every 5 (five) minutes as needed for chest pain. Please make yearly appt with Dr. Tamala Julian for September. 1st attempt   pantoprazole 40 MG tablet Commonly known as: PROTONIX TAKE 1 TABLET BY MOUTH DAILY GENERIC EQUIVALENT FOR PROTONIX   predniSONE 10 MG tablet Commonly known as: DELTASONE TAKE 4 TABLETS(40 MG) BY MOUTH DAILY WITH BREAKFAST   PRESERVISION AREDS 2 PO Take 1 capsule by mouth 2 (two) times daily.   sertraline 50 MG tablet Commonly known as: ZOLOFT Take 50 mg by mouth every evening.   tamsulosin 0.4 MG Caps capsule Commonly known as: FLOMAX Take 0.8 mg by mouth daily after supper.   timolol 0.5 % ophthalmic solution Commonly known as: BETIMOL Place 1 drop into the right eye daily.   triamcinolone cream 0.1 % Commonly known as: KENALOG Apply  1 application topically 2 (two) times daily as needed (itching).   Vitamin D3 50 MCG (2000 UT) Tabs Take 2,000 Units by mouth every evening.           Outstanding Labs/Studies   none  Duration of Discharge Encounter   Greater than 30 minutes including physician time.  Signed, Angelena Form, PA-C 09/09/2019, 10:16 AM [phone]  I have personally seen and examined this patient. I agree with the assessment and plan as outlined above.  He is doing well this am. Still  with urinary retention. We will place a foley and have follow up with urology. BP stable. No heart block. Continue ASA alone. Discharge home today.   Lauree Chandler 09/09/2019 10:26 AM

## 2019-09-09 ENCOUNTER — Inpatient Hospital Stay: Payer: Medicare Other

## 2019-09-09 ENCOUNTER — Inpatient Hospital Stay: Payer: Medicare Other | Admitting: Oncology

## 2019-09-09 DIAGNOSIS — Q253 Supravalvular aortic stenosis: Secondary | ICD-10-CM | POA: Diagnosis not present

## 2019-09-09 LAB — CBC
HCT: 34.1 % — ABNORMAL LOW (ref 39.0–52.0)
Hemoglobin: 11 g/dL — ABNORMAL LOW (ref 13.0–17.0)
MCH: 25.6 pg — ABNORMAL LOW (ref 26.0–34.0)
MCHC: 32.3 g/dL (ref 30.0–36.0)
MCV: 79.5 fL — ABNORMAL LOW (ref 80.0–100.0)
Platelets: 51 10*3/uL — ABNORMAL LOW (ref 150–400)
RBC: 4.29 MIL/uL (ref 4.22–5.81)
RDW: 17.8 % — ABNORMAL HIGH (ref 11.5–15.5)
WBC: 26 10*3/uL — ABNORMAL HIGH (ref 4.0–10.5)
nRBC: 0 % (ref 0.0–0.2)

## 2019-09-09 LAB — BASIC METABOLIC PANEL
Anion gap: 8 (ref 5–15)
BUN: 22 mg/dL (ref 8–23)
CO2: 21 mmol/L — ABNORMAL LOW (ref 22–32)
Calcium: 8.5 mg/dL — ABNORMAL LOW (ref 8.9–10.3)
Chloride: 104 mmol/L (ref 98–111)
Creatinine, Ser: 1.01 mg/dL (ref 0.61–1.24)
GFR calc Af Amer: >60 mL/min (ref >60)
GFR calc non Af Amer: >60 mL/min (ref >60)
Glucose, Bld: 176 mg/dL — ABNORMAL HIGH (ref 70–99)
Potassium: 4 mmol/L (ref 3.5–5.1)
Sodium: 133 mmol/L — ABNORMAL LOW (ref 135–145)

## 2019-09-09 MED ORDER — ROMIPLOSTIM 125 MCG ~~LOC~~ SOLR
1.0000 ug/kg | Freq: Once | SUBCUTANEOUS | Status: AC
  Administered 2019-09-09: 80 ug via SUBCUTANEOUS
  Filled 2019-09-09: qty 0.16

## 2019-09-09 NOTE — Progress Notes (Signed)
Dysuria:  Patient having a hard time emptying bladder this AM.  Voided for 50 ml and checked post void residual which was at 400.  Patient is complaining of discomfort in bladder/prostate area.  Contacted Dr. Roxan Hockey this AM and plan to straight cath patient per orders.

## 2019-09-09 NOTE — Progress Notes (Signed)
CARDIAC REHAB PHASE I   Offered to walk with pt, pt requesting rest after getting foley inserted. Reinforced importance of restrictions, site care, and exercise guidelines with emphasis on safety. Pt very grateful for the care he has received, but is very excited to be going home. Pt referred to [facility] [place].  [phone] Rufina Falco, RN BSN 09/09/2019 11:51 AM

## 2019-09-09 NOTE — TOC Initial Note (Signed)
Transition of Care [facility]) - Initial/Assessment Note    Patient Details  Name: [name] MRN: [id number] Date of Birth: Sep 30, 1923  Transition of Care [facility]) CM/SW Contact:    Curlene Labrum, RN Phone Number: 09/09/2019, 11:06 AM  Clinical Narrative:                 Case Management with the patient and his wife at the bedside for transitions of care for home.  Patient ambulates well with no needs for dme according to the patient and primary nurse.  The patient will be discharged this morning to home with a new foley and medications S/P TAVR.  Patient and wife given choice regarding home health and patient with no preference.  Alvis Lemmings was called and spoke with Tommi Rumps who will be providing a RN at the home for catheter care and teaching for foley and medications.  Will follow.  Expected Discharge Plan: [place] Barriers to Discharge: No Barriers Identified   Patient Goals and CMS Choice Patient states their goals for this hospitalization and ongoing recovery are:: Patient is ready to go home today.   Choice offered to / list presented to : Patient  Expected Discharge Plan and Services Expected Discharge Plan: [place]   Discharge Planning Services: CM Consult Post Acute Care Choice: [place] arrangements for the past 2 months: Single Family Home Expected Discharge Date: 09/09/19                         HH Arranged: RN HH Agency: [place] Date [place]: 09/09/19 Time [place]: [id number] Representative spoke with at [place]: Tommi Rumps with Alvis Lemmings [facility]  Prior Living Arrangements/Services Living arrangements for the past 2 months: [place] Lives with:: Spouse Patient language and need for interpreter reviewed:: Yes Do you feel safe going back to the place where you live?: Yes      Need for Family Participation in Patient Care: Yes (Comment) Care giver support system in place?: Yes  (comment)   Criminal Activity/Legal Involvement Pertinent to Current Situation/Hospitalization: No - Comment as needed  Activities of Daily Living      Permission Sought/Granted Permission sought to share information with : Case Manager       Permission granted to share info w AGENCY: [facility] granted to share info w Relationship: wife     Emotional Assessment Appearance:: Appears stated age Attitude/Demeanor/Rapport: Gracious Affect (typically observed): Accepting Orientation: : Oriented to Self, Oriented to Place, Oriented to  Time, Oriented to Situation Alcohol / Substance Use: Not Applicable Psych Involvement: No (comment)  Admission diagnosis:  S/P TAVR (transcatheter aortic valve replacement) [Z95.2] Patient Active Problem List   Diagnosis Date Noted  . Acute on chronic diastolic heart failure ([place]) 09/06/2019  . Bifascicular block 09/06/2019  . 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 Severe aortic stenosis   . Chronic ITP (idiopathic thrombocytopenia) (HCC) 07/04/2019  . Chronic eczematous otitis externa of both ears 01/15/2016  . Sensorineural hearing loss (SNHL) of both ears 01/15/2016  . Prostate nodule 03/09/2015  . Constipation 06/22/2014  . Malaise and fatigue 06/22/2014  . CAD (coronary artery disease)   . GERD (gastroesophageal reflux disease)   . Hypertension   . Dyslipidemia   . Colon cancer ([place])   . Solitary pulmonary nodule 02/11/2013  . Leukemic reticuloendotheliosis, extranodal and solid organ sites [facility]) 01/03/2013  .  Glaucoma 11/27/2011  . Personal history of other malignant neoplasm of skin 10/14/2011  . Elevated PSA 09/12/2011  . Liposarcoma, well differentiated type ([place]) 09/12/2011  . Traction detachment of retina 11/18/2010  . Exudative age-related macular degeneration ([place]) 04/16/2010  . Epiretinal membrane 04/16/2010  . Vitreous degeneration 04/16/2010  . Staphylococcus aureus infection 11/30/2006    PCP:  [name], MD Pharmacy:   [facility] [place], [place] [address] Phone: [phone] Fax: [phone]  [facility] [place], [place] [address] Phone: [phone] Fax: [phone]     Social Determinants of Health ([place]) Interventions    Readmission Risk Interventions Readmission Risk Prevention Plan 09/09/2019  Post Dischage Appt Complete  Medication Screening Complete  Transportation Screening Complete  Some recent data might be hidden

## 2019-09-10 ENCOUNTER — Ambulatory Visit: Payer: Medicare Other

## 2019-09-12 ENCOUNTER — Telehealth: Payer: Self-pay | Admitting: Physician Assistant

## 2019-09-12 ENCOUNTER — Telehealth: Payer: Self-pay | Admitting: Student

## 2019-09-12 ENCOUNTER — Telehealth: Payer: Self-pay | Admitting: Oncology

## 2019-09-12 ENCOUNTER — Other Ambulatory Visit: Payer: Self-pay | Admitting: Physician Assistant

## 2019-09-12 DIAGNOSIS — Z952 Presence of prosthetic heart valve: Secondary | ICD-10-CM

## 2019-09-12 NOTE — Telephone Encounter (Signed)
We should anticoagulate if safe. I vote foe reduced dose Eliquis. However, need feedback from Dr. Benay Spice who I am copying on this note due to increased bleeding risk with thrombocytopenia. Would defer therapy until Dr. Benay Spice weighs in.Marland KitchenMarland Kitchen

## 2019-09-12 NOTE — Telephone Encounter (Signed)
   Received page from Answering Service from [facility] about an abnormal EKG. Called and spoke with [facility] staff. Patient noted to go into atrial flutter around 5:00am this morning. Rate 99-145 at the time. Patient has no history of atrial fibrillation or atrial flutter and is not on any anticoagulation. He has a history of thrombocytopenia with platelets in the 50,000's at last check. Called patient this morning and spoke with wife. Patient asymptomatic with this and just woke up. No palpitations. No lightheadedness or dizziness at this time. Saw Dr. Angelena Form in the hospital who advised sending message to Nell Range, PA-C, for further recommendations regarding anticoagulation.  Darreld Mclean, PA-C 09/12/2019 8:25 AM

## 2019-09-12 NOTE — Telephone Encounter (Signed)
  [place] VALVE TEAM   Patient contacted regarding discharge from [facility] on 7/9  Patient understands to follow up with provider Nell Range on 7/14 at [facility].  Patient understands discharge instructions? yes Patient understands medications and regimen? yes Patient understands to bring all medications to this visit? yes  Angelena Form PA-C  [facility]

## 2019-09-12 NOTE — Telephone Encounter (Signed)
Scheduled apt per 7/09 sch msg - pt is aware of appt date and time

## 2019-09-12 NOTE — Telephone Encounter (Signed)
Hey, I reviewed strips and it looks like true afib. Pt was asymptomatic. Has elevated CHADSVASC but has ongoing issues with thrombocytopenia. Any thoughts on anticoagulation?  KT

## 2019-09-13 ENCOUNTER — Inpatient Hospital Stay: Payer: Medicare Other

## 2019-09-13 ENCOUNTER — Telehealth: Payer: Self-pay | Admitting: Interventional Cardiology

## 2019-09-13 ENCOUNTER — Telehealth: Payer: Self-pay | Admitting: Physician Assistant

## 2019-09-13 ENCOUNTER — Encounter: Payer: Self-pay | Admitting: Nurse Practitioner

## 2019-09-13 ENCOUNTER — Other Ambulatory Visit: Payer: Self-pay

## 2019-09-13 ENCOUNTER — Telehealth: Payer: Self-pay | Admitting: Nurse Practitioner

## 2019-09-13 ENCOUNTER — Inpatient Hospital Stay (HOSPITAL_BASED_OUTPATIENT_CLINIC_OR_DEPARTMENT_OTHER): Payer: Medicare Other | Admitting: Nurse Practitioner

## 2019-09-13 VITALS — BP 134/70 | HR 61 | Temp 97.7°F | Resp 16 | Ht 64.0 in | Wt 173.5 lb

## 2019-09-13 DIAGNOSIS — D696 Thrombocytopenia, unspecified: Secondary | ICD-10-CM | POA: Diagnosis not present

## 2019-09-13 DIAGNOSIS — D693 Immune thrombocytopenic purpura: Secondary | ICD-10-CM

## 2019-09-13 DIAGNOSIS — I209 Angina pectoris, unspecified: Secondary | ICD-10-CM

## 2019-09-13 LAB — CBC WITH DIFFERENTIAL (CANCER CENTER ONLY)
Abs Immature Granulocytes: 4.48 10*3/uL — ABNORMAL HIGH (ref 0.00–0.07)
Basophils Absolute: 0 10*3/uL (ref 0.0–0.1)
Basophils Relative: 1 %
Eosinophils Absolute: 0 10*3/uL (ref 0.0–0.5)
Eosinophils Relative: 0 %
HCT: 34.4 % — ABNORMAL LOW (ref 39.0–52.0)
Hemoglobin: 11.2 g/dL — ABNORMAL LOW (ref 13.0–17.0)
Immature Granulocytes: 23 %
Lymphocytes Relative: 2 %
Lymphs Abs: 0.4 10*3/uL — ABNORMAL LOW (ref 0.7–4.0)
MCH: 26.2 pg (ref 26.0–34.0)
MCHC: 32.6 g/dL (ref 30.0–36.0)
MCV: 80.6 fL (ref 80.0–100.0)
Monocytes Absolute: 1.9 10*3/uL — ABNORMAL HIGH (ref 0.1–1.0)
Monocytes Relative: 10 %
Neutro Abs: 12.7 10*3/uL — ABNORMAL HIGH (ref 1.7–7.7)
Neutrophils Relative %: 65 %
Platelet Count: 56 10*3/uL — ABNORMAL LOW (ref 150–400)
RBC: 4.27 MIL/uL (ref 4.22–5.81)
RDW: 18.3 % — ABNORMAL HIGH (ref 11.5–15.5)
WBC Count: 19.7 10*3/uL — ABNORMAL HIGH (ref 4.0–10.5)
nRBC: 0.5 % — ABNORMAL HIGH (ref 0.0–0.2)

## 2019-09-13 NOTE — Telephone Encounter (Signed)
Thanks Katie.

## 2019-09-13 NOTE — Telephone Encounter (Signed)
I spoke with Dr. Learta Codding via phone today. He was seeing [name] for hematology follow up. Platelets are stable >50K. He did have extensive ecchymosis in groins and legs with some mild oozing at incision site. They applied a pressure dressing. I asked him to hold aspirin until I see him in the office tomorrow. We discussed long term anticoagulation as well. Given high risk of bleeding we decided to hold off of that for now. Will assess afib burden when he completes his monitor and make a decision at a later time.   Angelena Form PA-C  [facility]

## 2019-09-13 NOTE — Telephone Encounter (Signed)
Agapito Games from [facility] is calling the office to let us know that there has been a delay in their appointment to see the patient. She states that the patient was d/c'd from the hospital with home health orders, and according to medicare, the patient is to be seen 24-48 hours within the d/c date. The patient is requesting [place] come Thursday 7/15 due to him having a busy day with appointments.

## 2019-09-13 NOTE — Telephone Encounter (Signed)
New message    Talk to [facility] about a foley leakage

## 2019-09-13 NOTE — Progress Notes (Signed)
HEART AND [place]                                       Cardiology Office Note    Date:  09/15/2019   ID:  SPARSH CALLENS, DOB [birth year], MRN [id number]  PCP:  [name], MD  Cardiologist: Sinclair Grooms, MD / Dr. Angelena Form & Dr. Cyndia Bent (TAVR)  CC: TOC s/p TAVR  History of Present Illness:  [name] is a 86 y.o. male with a history of idiopathic thrombocytopenia (Rx'd with Nplate and steroids),bifasicular block (RBBB, LAFB), anemia, anxiety, CAD, colon cancer,HLD, GERD, hairy cell leukemia, HTN and severe aortic stenosiss/p TAVR ( 09/06/19) c/b acute urinary retention requiring a foley placement who presents to clinic for follow up.   He has been followed by Dr. Tamala Julian with what is felt to be a moderate aortic stenosis. An echocardiogram in October 2020 showed a mean gradient of 32 mmHg with a peak gradient of 55 mmHg and a valve area of 0.85 cm. Left ventricular ejection fraction was 60 to 65%. He now presents with progressive exertional fatigue and shortness of breath which he says has been going on for several years but recently getting worse to the point where he gets fatigue and shortness of breath with going up stairs or an incline. He has had generalized weakness in his legs with any exercise. Repeat echocardiogram on 06/13/2019 showed further progression of his aortic stenosis with a mean gradient of 43 mmHg and a peak gradient of 67 mmHg. Aortic valve area by VTI was 0.76 cm with a dimensionless index of 0.24 consistent with severe aortic stenosis. Left ventricular ejection fraction remained 60 to 65% with grade 1 diastolic dysfunction. L/RHC on 07/26/19 showed chronically occluded mid LAD, moderate disease in the diagonals and mid RCA. Medical therapy recommended for CAD.  He was evaluated by the multidisciplinary valve team and underwent successful TAVR with a18mm Edwards Sapien 3 UltraTHV via the TF approach  on7/6/21. Post operative echoshowed EF 65-70%, normally functioningTAVRwith a mean gradient of 11 mm Hg and no PVL. He was discharged home on Asprin alone given historyof thrombocytopenia. Given underlying conduction disease and TAVR a Zio patch was placed at discharge. Additionally, he had continued issues with urinary retention and was discharged with a foley catheter in place.   We were alerted by Zio on 7/12 of new onset afib. Plan is to assess afib burden before discussing anticoagulation given ongoing thrombocytopenia.   Today he presents to clinic for follow up. Doing well. Had foley removed this morning with subsequent episodes of incontinence. No CP or SOB. Has had worsening LE edema, but orthopnea or PND. No dizziness or syncope. No more blood in urine. No palpitations. Cannot tell a big difference after valve surgery.    Past Medical History:  Diagnosis Date  . Acute appendicitis   . Anemia   . Anxiety   . Arthritis    "back" (03/14/2014)  . Blood dyscrasia    hairy cell leukemia  . CAD (coronary artery disease)    a. 03/14/14  s/p overlapping DES x2 to mid-distal RCA.  . 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 Colon cancer ([place]) 1984  . Compression fracture of lumbar spine, non-traumatic ([place])   . DJD (degenerative joint disease) of lumbar spine   . Dyslipidemia   . Dyspnea   .  Elevated PSA   . GERD (gastroesophageal reflux disease)   . Glaucoma   . Hairy cell leukemia ([place]) dx'd 1980  . Heart murmur   . History of blood transfusion    "several; related to hairy cell leukemia & tx "  . History of stomach ulcers 1968  . Hypertension   . Leukemia, hairy cell ([place])   . Malnutrition ([place])   . Osteoarthritis   . Osteoporosis   . Pneumonia   . 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 Angelena Form and Cyndia Bent.   . Severe aortic stenosis    s/p tavr  . Skin cancer of face      Past Surgical History:  Procedure Laterality Date  . APPENDECTOMY  10/2007  . CATARACT EXTRACTION, BILATERAL Bilateral 02/2008  . COLON SURGERY  1984   "sigmoid; open"  . CORONARY ANGIOPLASTY WITH STENT PLACEMENT  03/14/2014   "2"  . EYE SURGERY Bilateral    cataract  . FRACTURE SURGERY    . LEFT HEART CATHETERIZATION WITH CORONARY ANGIOGRAM N/A 03/14/2014   Procedure: LEFT HEART CATHETERIZATION WITH CORONARY ANGIOGRAM;  Surgeon: Peter M Martinique, MD;  [facility] C[facility] LAB;  Service: Cardiovascular;  Laterality: N/A;  . LIPOMA EXCISION Right 07/2011   liposarcoma resection; "back"  . MOHS SURGERY Left 02/2011  . MOHS SURGERY  X 3   "all on my face"  . MOLE REMOVAL Left 1985   cheek  . ORIF ANKLE FRACTURE Left 2000  . RIGHT/LEFT HEART CATH AND CORONARY ANGIOGRAPHY N/A 07/26/2019   Procedure: RIGHT/LEFT HEART CATH AND CORONARY ANGIOGRAPHY;  Surgeon: Belva Crome, MD;  Location: [place] CV LAB;  Service: Cardiovascular;  Laterality: N/A;  . SHOULDER SURGERY  04/2004  . SPLENECTOMY  1992  . TEE WITHOUT CARDIOVERSION N/A 09/06/2019   Procedure: TRANSESOPHAGEAL ECHOCARDIOGRAM (TEE);  Surgeon: Burnell Blanks, MD;  Location: [place] CV LAB;  Service: Open Heart Surgery;  Laterality: N/A;  . TONSILLECTOMY AND ADENOIDECTOMY  1939  . TRANSCATHETER AORTIC VALVE REPLACEMENT, TRANSFEMORAL N/A 09/06/2019   Procedure: TRANSCATHETER AORTIC VALVE REPLACEMENT, TRANSFEMORAL;  Surgeon: Burnell Blanks, MD;  Location: [place] CV LAB;  Service: Open Heart Surgery;  Laterality: N/A;    Current Medications: Outpatient Medications Prior to Visit  Medication Sig Dispense Refill  . atorvastatin (LIPITOR) 20 MG tablet TAKE 1 TABLET BY MOUTH DAILY AT 6 PM (Patient taking differently: Take 20 mg by mouth daily at 6 PM. ) 90 tablet 3  . Cholecalciferol (VITAMIN D3) 50 MCG (2000 UT) TABS Take 2,000 Units by mouth every evening.    Marland Kitchen desonide (DESOWEN) 0.05 % cream Apply 1 application  topically 2 (two) times daily as needed (rash/irritation.).     Marland Kitchen fluocinonide (LIDEX) 0.05 % external solution Apply 1 application topically 2 (two) times daily as needed (apply to ears).   11  . latanoprost (XALATAN) 0.005 % ophthalmic solution Place 1 drop into both eyes at bedtime.     . metoprolol tartrate (LOPRESSOR) 25 MG tablet Take 0.5 tablets (12.5 mg total) by mouth daily. 90 tablet 2  . Multiple Vitamins-Minerals (PRESERVISION AREDS 2 PO) Take 1 capsule by mouth 2 (two) times daily.     . nitroGLYCERIN (NITROSTAT) 0.4 MG SL tablet Place 1 tablet (0.4 mg total) under the tongue every 5 (five) minutes as needed for chest pain. Please make yearly appt with Dr. Tamala Julian for September. 1st  attempt 25 tablet 2  . pantoprazole (PROTONIX) 40 MG tablet TAKE 1 TABLET BY MOUTH DAILY GENERIC EQUIVALENT FOR PROTONIX 90 tablet 2  . predniSONE (DELTASONE) 10 MG tablet TAKE 4 TABLETS(40 MG) BY MOUTH DAILY WITH BREAKFAST 120 tablet 1  . ranibizumab (LUCENTIS) 0.5 MG/0.05ML SOLN 0.5 mg by Intravitreal route every 28 (twenty-eight) days. Left Eye ONLY    . sertraline (ZOLOFT) 50 MG tablet Take 50 mg by mouth every evening.     . tamsulosin (FLOMAX) 0.4 MG CAPS capsule Take 0.8 mg by mouth daily after supper.     . timolol (BETIMOL) 0.5 % ophthalmic solution Place 1 drop into the right eye daily.     Marland Kitchen triamcinolone cream (KENALOG) 0.1 % Apply 1 application topically 2 (two) times daily as needed (itching).     Marland Kitchen aspirin 81 MG chewable tablet Chew 1 tablet (81 mg total) by mouth daily. (Patient not taking: Reported on 09/14/2019)     No facility-administered medications prior to visit.     Allergies:   Antazoline; Antihistamines, chlorpheniramine-type; and Sulfa antibiotics   Social History   Socioeconomic History  . Marital status: Married    Spouse name: Not on file  . Number of children: Not on file  . Years of education: Not on file  . Highest education level: Not on file  Occupational History    . Not on file  Tobacco Use  . Smoking status: Former Smoker    Packs/day: 0.25    Years: 1.00    Pack years: 0.25    Types: Cigarettes, Cigars  . Smokeless tobacco: Never Used  . Tobacco comment: occasional social smoker during college.  Vaping Use  . Vaping Use: Never used  Substance and Sexual Activity  . Alcohol use: Yes    Alcohol/week: 9.0 standard drinks    Types: 2 Glasses of wine, 2 Shots of liquor, 5 Standard drinks or equivalent per week    Comment: socially  . Drug use: No  . Sexual activity: Never  Other Topics Concern  . Not on file  Social History Narrative  . Not on file   Social Determinants of Health   Financial Resource Strain:   . Difficulty of Paying Living Expenses:   Food Insecurity:   . Worried About Charity fundraiser in the Last Year:   . Arboriculturist in the Last Year:   Transportation Needs:   . Film/video editor (Medical):   Marland Kitchen Lack of Transportation (Non-Medical):   Physical Activity:   . Days of Exercise per Week:   . Minutes of Exercise per Session:   Stress:   . Feeling of Stress :   Social Connections:   . Frequency of Communication with Friends and Family:   . Frequency of Social Gatherings with Friends and Family:   . Attends Religious Services:   . Active Member of Clubs or Organizations:   . Attends Archivist Meetings:   Marland Kitchen Marital Status:      Family History:  The patient's family history includes Congestive Heart Failure (age of onset: 48) in his father; Diabetes Mellitus II in his brother; Heart Problems in his brother; Prostate cancer in his brother; Stroke in his mother.     ROS:   Please see the history of present illness.    ROS All other systems reviewed and are negative.   PHYSICAL EXAM:   VS:  BP (!) 142/74   Pulse 62   Ht 5\' 4"  (  1.626 m)   Wt 173 lb 12.8 oz (78.8 kg)   BMI 29.83 kg/m    GEN: Well nourished, well developed, in no acute distress HEENT: normal Neck: no JVD or  masses Cardiac: RRR; no murmurs, rubs, or gallops. 2+ bilateral LE edema  Respiratory:  clear to auscultation bilaterally, normal work of breathing GI: soft, nontender, nondistended, + BS MS: no deformity or atrophy Skin: warm and dry, no rash.  Groin sites clear without hematoma. There is extenisve ecchymosis down anterior legs and groin area into genitals. Right groin site with mild oozing.  Neuro:  Alert and Oriented x 3, Strength and sensation are intact Psych: euthymic mood, full affect   Wt Readings from Last 3 Encounters:  09/14/19 173 lb 12.8 oz (78.8 kg)  09/13/19 173 lb 8 oz (78.7 kg)  09/09/19 171 lb 15.3 oz (78 kg)     Studies/Labs Reviewed:   EKG:  EKG is ordered today.  The ekg ordered today demonstrates sinus with RBBB and PACs HR 62 bpm  Recent Labs: 09/02/2019: ALT 21; B Natriuretic Peptide 296.9 09/07/2019: Magnesium 1.7 09/09/2019: BUN 22; Creatinine, Ser 1.01; Potassium 4.0; Sodium 133 09/13/2019: Hemoglobin 11.2; Platelet Count 56   Lipid Panel    Component Value Date/Time   CHOL 122 (L) 12/10/2015 0949   TRIG 154 (H) 12/10/2015 0949   HDL 33 (L) 12/10/2015 0949   CHOLHDL 3.7 12/10/2015 0949   VLDL 31 (H) 12/10/2015 0949   LDLCALC 58 12/10/2015 0949    Additional studies/ records that were reviewed today include:  TAVR OPERATIVE NOTE   Date of Procedure:09/06/2019  Preoperative Diagnosis:Severe Aortic Stenosis   Postoperative Diagnosis:Same   Procedure:   Transcatheter Aortic Valve Replacement - PercutaneousRightTransfemoral Approach Edwards Sapien 3 Ultra THV (size 34mm, model # 9750TFX, serial # [id number])  Co-Surgeons:Bryan Alveria Apley, MD / Lauree Chandler, MD   Anesthesiologist:R. Ola Spurr, MD  Anne Hahn, MD  Pre-operative Echo Findings: ? Severe aortic stenosis ? Normalleft ventricular  systolic function  Post-operative Echo Findings: ? Noparavalvular leak ? Normalleft ventricular systolic function  ____________________   Echo7/7/21: IMPRESSIONS  1. Day 1 post TAVR with normal transaortic gradients with peak/mean 18/11 mmHg. Trivial paravalvular leak.  2. Left ventricular ejection fraction, by estimation, is 65 to 70%. The  left ventricle has normal function. The left ventricle has no regional  wall motion abnormalities. Left ventricular diastolic function could not  be evaluated.  3. Right ventricular systolic function is normal. The right ventricular  size is normal. There is normal pulmonary artery systolic pressure.  4. The mitral valve is normal in structure. Trivial mitral valve  regurgitation. No evidence of mitral stenosis.  5. The aortic valve has been repaired/replaced. Aortic valve  regurgitation is not visualized. No aortic stenosis is present. There is a  26 mm Ultra, stented (TAVR) valve present in the aortic position.  Procedure Date: 09/06/2019. Echo findings are  consistent with normal structure and function of the aortic valve  prosthesis. Aortic valve mean gradient measures 11.0 mmHg.  6. The inferior vena cava is normal in size with greater than 50%  respiratory variability, suggesting right atrial pressure of 3 mmHg.     ASSESSMENT & PLAN:   Severe AS s/p TAVR: groin sites healing well. Extensive ecchymosis down anterior legs and groin area into genitals. Right groin site with mild oozing. He will hold aspirin x 1 week and then restart. ECG with sinus with RBBB, but no HAVB. Wearing a Zio patch. Continue off antiplatelets  with thrombocytopenia until groin heals up more. SBE prophylaxis discussed; I have RX'd amoxicillin. I will see him back next month for follow up and echo.   HTN: Bp mildly elevated. With LE edema, start lasix 40mg  daily.   Acute urinary retention with hematuria: seen by [facility] and foley removed.    Conduction disease: wearing a Zio patch  Thrombocytopenia: Followed closely by Dr. Ammie Dalton.   Acute on chronic diastolic CHF: he has worsening LE edema. Will start lasix 40 mg daily and Kdur 10 Meq daily. BMET next week when he see's Dr. Ammie Dalton  PAF: noted on Zio patch. Will follow final report for afib burden before deciding on long term plan for oral anticoagulation.   Medication Adjustments/Labs and Tests Ordered: Current medicines are reviewed at length with the patient today.  Concerns regarding medicines are outlined above.  Medication changes, Labs and Tests ordered today are listed in the Patient Instructions below. Patient Instructions  Medication Instructions:  1) START LASIX 40 mg daily 2) START KDUR (potassium) 10 meq daily 3) Your provider discussed the importance of taking an antibiotic prior to all dental visits to prevent damage to the heart valves from infection. You were given a prescription for AMOXIL 2,000 mg to take one hour prior to any dental appointment.  *If you need a refill on your cardiac medications before your next appointment, please call your pharmacy*  Lab Work: Your provider recommends that you return for lab work in 2 weeks. You may come ANY TIME between 7:30AM and 4:30PM. You do not need to be fasting. If you have labs (blood work) drawn today and your tests are completely normal, you will receive your results only by: Marland Kitchen MyChart Message (if you have MyChart) OR . A paper copy in the mail If you have any lab test that is abnormal or we need to change your treatment, we will call you to review the results.  Follow-Up: Please keep your appointments as scheduled!    Signed, Angelena Form, PA-C  09/15/2019 10:32 AM    [facility] [place], [place], [place]  [address] Phone: [phone]; Fax: [phone]

## 2019-09-13 NOTE — Progress Notes (Addendum)
[name]   Diagnosis: Thrombocytopenia, history of hairy cell leukemia  INTERVAL HISTORY:   [name] returns as scheduled.  He underwent the TAVR procedure 09/06/2019.  He received Nplate prior to discharge 09/09/2019.  Platelet count at that time 51,000.  Current prednisone dose is 30 mg daily.  He has an indwelling Foley catheter.  He is scheduled for a voiding trial tomorrow.  He notes some blood in the collection catheter.  He reports extensive bruising bilateral groin regions.  Objective:  Vital signs in last 24 hours:  Blood pressure 134/70, pulse 61, temperature 97.7 F (36.5 C), temperature source Temporal, resp. rate 16, height _0  (1.626 m), weight 173 lb 8 oz (78.7 kg), SpO2 97 %.    HEENT: No thrush or ulcers.  No bleeding within the oral cavity. Resp: Faint rales at both lung bases.  No respiratory distress. Cardio: Regular rate and rhythm. GI: Abdomen soft and nontender.  No hepatosplenomegaly. Vascular: Trace edema at the lower legs bilaterally. Neuro: Alert and oriented. Skin: Large ecchymoses low abdomen/groin/upper thigh regions.  Slow oozing from the right groin puncture site. GU: Indwelling Foley catheter.  Urine in the collection bag is yellow.  Lab Results:  Lab Results  Component Value Date   WBC 19.7 (H) 09/13/2019   HGB 11.2 (L) 09/13/2019   HCT 34.4 (L) 09/13/2019   MCV 80.6 09/13/2019   PLT 56 (L) 09/13/2019   NEUTROABS PENDING 09/13/2019    Imaging:  No results found.  Medications: I have reviewed the patient's current medications.  Assessment/Plan: 1. Thrombocytopenia ? Bone marrow biopsy 07/08/2016-no evidence of B-cell lymphoma, 40% cellular bone marrow with trilineage hematopoiesis, megakaryocytes present with normal morphology, normal cytogenetics, negative for BRAF mutation ? Flow cytometry 01/05/2009-no monoclonal B-cell or phenotypically abnormal T-cell population ? Prednisone starting  06/29/2019 ? Nplate 07/04/2019, 03/04/5850,DPOEUM 07/18/2019 ? Prednisone 20 mg daily beginning 07/25/2019 ? Platelets 11,000 on 08/15/2019, prednisone increased to 40 mg daily, Nplate resume 3/53/6144  ? Prednisone decreased to 30 mg daily 08/25/2019, Nplate continued ? Prednisone continued at 30 mg daily 09/13/2019, weekly Nplate continued 2. Hairy cell leukemia 1982, status post splenectomy 3. Coronary artery disease 4. Aortic stenosis 5. 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 Macular degeneration 7. Hearing loss 8. Basal cell carcinoma left cheek 05/24/2019 9. Left upper lobe nodule, faint FDG activity on PET at [facility] 05/31/2013 10. Status post TAVR procedure 09/06/2019   Disposition: [name] has thrombocytopenia most likely secondary to ITP.  The platelet count is stable at 56,000. Plan to continue weekly Nplate, next injection 09/16/2019.  Continue prednisone 30 mg daily.  Repeat CBC when he is here on 09/16/2019.  He has extensive bruising at the low abdomen/groin/upper thigh regions.  The right femoral catheter site has slow oozing of blood.  [name] spoke with cardiology.  Aspirin placed on hold.  We applied a pressure bandage.  He will follow up with cardiology as scheduled tomorrow.  We will see him in follow-up with lab and Nplate on 05/16/4006.  He will contact the office in the interim with any problems.  Patient seen with [name].    [name] ANP/GNP-BC   09/13/2019  11:29 AM  This was a shared visit with [name].  [name] was interviewed and examined.  I discussed the case with the cardiology service.  There is oozing from the right groin catheterization site today.  He will hold aspirin.  I  am reluctant to recommend anticoagulation therapy unless bleeding has completely resolved and he is deemed high risk for thromboembolic disease.  The platelet count remains adequate at present.  He will return for Nplate therapy and a repeat CBC later this  week.  Douglas Manson, MD

## 2019-09-13 NOTE — Telephone Encounter (Signed)
Scheduled per 07/13 los, patient has received updated calender. 

## 2019-09-14 ENCOUNTER — Other Ambulatory Visit: Payer: Self-pay | Admitting: Oncology

## 2019-09-14 ENCOUNTER — Other Ambulatory Visit: Payer: Self-pay

## 2019-09-14 ENCOUNTER — Ambulatory Visit (INDEPENDENT_AMBULATORY_CARE_PROVIDER_SITE_OTHER): Payer: Medicare Other | Admitting: Physician Assistant

## 2019-09-14 VITALS — BP 142/74 | HR 62 | Ht 64.0 in | Wt 173.8 lb

## 2019-09-14 DIAGNOSIS — I480 Paroxysmal atrial fibrillation: Secondary | ICD-10-CM

## 2019-09-14 DIAGNOSIS — I459 Conduction disorder, unspecified: Secondary | ICD-10-CM

## 2019-09-14 DIAGNOSIS — R339 Retention of urine, unspecified: Secondary | ICD-10-CM | POA: Diagnosis not present

## 2019-09-14 DIAGNOSIS — D696 Thrombocytopenia, unspecified: Secondary | ICD-10-CM

## 2019-09-14 DIAGNOSIS — I5032 Chronic diastolic (congestive) heart failure: Secondary | ICD-10-CM

## 2019-09-14 DIAGNOSIS — I10 Essential (primary) hypertension: Secondary | ICD-10-CM

## 2019-09-14 DIAGNOSIS — D693 Immune thrombocytopenic purpura: Secondary | ICD-10-CM

## 2019-09-14 DIAGNOSIS — Z952 Presence of prosthetic heart valve: Secondary | ICD-10-CM | POA: Diagnosis not present

## 2019-09-14 MED ORDER — POTASSIUM CHLORIDE ER 10 MEQ PO TBCR
10.0000 meq | EXTENDED_RELEASE_TABLET | Freq: Every day | ORAL | 3 refills | Status: DC
Start: 2019-09-14 — End: 2020-05-14

## 2019-09-14 MED ORDER — FUROSEMIDE 40 MG PO TABS
40.0000 mg | ORAL_TABLET | Freq: Every day | ORAL | 3 refills | Status: DC
Start: 2019-09-14 — End: 2019-09-21

## 2019-09-14 MED ORDER — AMOXICILLIN 500 MG PO TABS
ORAL_TABLET | ORAL | 11 refills | Status: DC
Start: 2019-09-14 — End: 2020-07-03

## 2019-09-14 NOTE — Telephone Encounter (Signed)
Home Health ordered after TAVR.  Will send to pt's TAVR team to make them aware.

## 2019-09-14 NOTE — Patient Instructions (Addendum)
Medication Instructions:  1) START LASIX 40 mg daily 2) START KDUR (potassium) 10 meq daily 3) Your provider discussed the importance of taking an antibiotic prior to all dental visits to prevent damage to the heart valves from infection. You were given a prescription for AMOXIL 2,000 mg to take one hour prior to any dental appointment.  *If you need a refill on your cardiac medications before your next appointment, please call your pharmacy*  Lab Work: Your provider recommends that you return for lab work in 2 weeks. You may come ANY TIME between 7:30AM and 4:30PM. You do not need to be fasting. If you have labs (blood work) drawn today and your tests are completely normal, you will receive your results only by: Marland Kitchen MyChart Message (if you have MyChart) OR . A paper copy in the mail If you have any lab test that is abnormal or we need to change your treatment, we will call you to review the results.  Follow-Up: Please keep your appointments as scheduled!

## 2019-09-14 NOTE — Telephone Encounter (Signed)
Thank you :)

## 2019-09-15 ENCOUNTER — Telehealth: Payer: Self-pay | Admitting: Interventional Cardiology

## 2019-09-15 NOTE — Telephone Encounter (Signed)
Noted and [employer] aware.

## 2019-09-15 NOTE — Telephone Encounter (Signed)
[name] with [facility] states patient denied home health care.

## 2019-09-16 ENCOUNTER — Encounter (HOSPITAL_COMMUNITY): Payer: Self-pay

## 2019-09-16 ENCOUNTER — Emergency Department (HOSPITAL_COMMUNITY): Payer: Medicare Other

## 2019-09-16 ENCOUNTER — Inpatient Hospital Stay (HOSPITAL_COMMUNITY)
Admission: EM | Admit: 2019-09-16 | Discharge: 2019-09-21 | DRG: 871 | Disposition: A | Discharge status: Home Health | Payer: Medicare Other | Source: Ambulatory Visit | Attending: Internal Medicine | Admitting: Internal Medicine

## 2019-09-16 ENCOUNTER — Encounter: Payer: Self-pay | Admitting: Cardiovascular Disease

## 2019-09-16 ENCOUNTER — Other Ambulatory Visit (HOSPITAL_COMMUNITY): Payer: Medicare Other

## 2019-09-16 ENCOUNTER — Inpatient Hospital Stay: Payer: Medicare Other

## 2019-09-16 ENCOUNTER — Other Ambulatory Visit: Payer: Self-pay

## 2019-09-16 ENCOUNTER — Telehealth: Payer: Self-pay | Admitting: Cardiovascular Disease

## 2019-09-16 DIAGNOSIS — G9341 Metabolic encephalopathy: Secondary | ICD-10-CM | POA: Diagnosis present

## 2019-09-16 DIAGNOSIS — Z85828 Personal history of other malignant neoplasm of skin: Secondary | ICD-10-CM | POA: Diagnosis not present

## 2019-09-16 DIAGNOSIS — E785 Hyperlipidemia, unspecified: Secondary | ICD-10-CM | POA: Diagnosis present

## 2019-09-16 DIAGNOSIS — I5033 Acute on chronic diastolic (congestive) heart failure: Secondary | ICD-10-CM | POA: Diagnosis present

## 2019-09-16 DIAGNOSIS — R29810 Facial weakness: Secondary | ICD-10-CM | POA: Diagnosis present

## 2019-09-16 DIAGNOSIS — Z9081 Acquired absence of spleen: Secondary | ICD-10-CM | POA: Diagnosis not present

## 2019-09-16 DIAGNOSIS — D72829 Elevated white blood cell count, unspecified: Secondary | ICD-10-CM | POA: Diagnosis not present

## 2019-09-16 DIAGNOSIS — A419 Sepsis, unspecified organism: Secondary | ICD-10-CM | POA: Diagnosis present

## 2019-09-16 DIAGNOSIS — R4781 Slurred speech: Secondary | ICD-10-CM | POA: Diagnosis present

## 2019-09-16 DIAGNOSIS — I2510 Atherosclerotic heart disease of native coronary artery without angina pectoris: Secondary | ICD-10-CM | POA: Diagnosis present

## 2019-09-16 DIAGNOSIS — I639 Cerebral infarction, unspecified: Secondary | ICD-10-CM | POA: Diagnosis not present

## 2019-09-16 DIAGNOSIS — I361 Nonrheumatic tricuspid (valve) insufficiency: Secondary | ICD-10-CM | POA: Diagnosis not present

## 2019-09-16 DIAGNOSIS — R4701 Aphasia: Secondary | ICD-10-CM | POA: Diagnosis present

## 2019-09-16 DIAGNOSIS — C9140 Hairy cell leukemia not having achieved remission: Secondary | ICD-10-CM | POA: Diagnosis present

## 2019-09-16 DIAGNOSIS — N390 Urinary tract infection, site not specified: Secondary | ICD-10-CM | POA: Diagnosis present

## 2019-09-16 DIAGNOSIS — E871 Hypo-osmolality and hyponatremia: Secondary | ICD-10-CM | POA: Diagnosis present

## 2019-09-16 DIAGNOSIS — M810 Age-related osteoporosis without current pathological fracture: Secondary | ICD-10-CM | POA: Diagnosis present

## 2019-09-16 DIAGNOSIS — I4891 Unspecified atrial fibrillation: Secondary | ICD-10-CM | POA: Diagnosis not present

## 2019-09-16 DIAGNOSIS — R6520 Severe sepsis without septic shock: Secondary | ICD-10-CM | POA: Diagnosis not present

## 2019-09-16 DIAGNOSIS — R7303 Prediabetes: Secondary | ICD-10-CM | POA: Diagnosis present

## 2019-09-16 DIAGNOSIS — G9340 Encephalopathy, unspecified: Secondary | ICD-10-CM | POA: Diagnosis not present

## 2019-09-16 DIAGNOSIS — B965 Pseudomonas (aeruginosa) (mallei) (pseudomallei) as the cause of diseases classified elsewhere: Secondary | ICD-10-CM | POA: Diagnosis not present

## 2019-09-16 DIAGNOSIS — I10 Essential (primary) hypertension: Secondary | ICD-10-CM | POA: Diagnosis not present

## 2019-09-16 DIAGNOSIS — I6782 Cerebral ischemia: Secondary | ICD-10-CM | POA: Diagnosis not present

## 2019-09-16 DIAGNOSIS — H9190 Unspecified hearing loss, unspecified ear: Secondary | ICD-10-CM | POA: Diagnosis present

## 2019-09-16 DIAGNOSIS — Z955 Presence of coronary angioplasty implant and graft: Secondary | ICD-10-CM | POA: Diagnosis not present

## 2019-09-16 DIAGNOSIS — Z20822 Contact with and (suspected) exposure to covid-19: Secondary | ICD-10-CM | POA: Diagnosis present

## 2019-09-16 DIAGNOSIS — J3489 Other specified disorders of nose and nasal sinuses: Secondary | ICD-10-CM | POA: Diagnosis not present

## 2019-09-16 DIAGNOSIS — R509 Fever, unspecified: Secondary | ICD-10-CM

## 2019-09-16 DIAGNOSIS — H3530 Unspecified macular degeneration: Secondary | ICD-10-CM | POA: Diagnosis present

## 2019-09-16 DIAGNOSIS — Z8679 Personal history of other diseases of the circulatory system: Secondary | ICD-10-CM | POA: Diagnosis not present

## 2019-09-16 DIAGNOSIS — R338 Other retention of urine: Secondary | ICD-10-CM | POA: Diagnosis present

## 2019-09-16 DIAGNOSIS — Z7952 Long term (current) use of systemic steroids: Secondary | ICD-10-CM | POA: Diagnosis not present

## 2019-09-16 DIAGNOSIS — A498 Other bacterial infections of unspecified site: Secondary | ICD-10-CM | POA: Diagnosis present

## 2019-09-16 DIAGNOSIS — I6389 Other cerebral infarction: Secondary | ICD-10-CM | POA: Diagnosis not present

## 2019-09-16 DIAGNOSIS — Z952 Presence of prosthetic heart valve: Secondary | ICD-10-CM

## 2019-09-16 DIAGNOSIS — I350 Nonrheumatic aortic (valve) stenosis: Secondary | ICD-10-CM

## 2019-09-16 DIAGNOSIS — I130 Hypertensive heart and chronic kidney disease with heart failure and stage 1 through stage 4 chronic kidney disease, or unspecified chronic kidney disease: Secondary | ICD-10-CM | POA: Diagnosis present

## 2019-09-16 DIAGNOSIS — I480 Paroxysmal atrial fibrillation: Secondary | ICD-10-CM | POA: Diagnosis present

## 2019-09-16 DIAGNOSIS — Z85038 Personal history of other malignant neoplasm of large intestine: Secondary | ICD-10-CM

## 2019-09-16 DIAGNOSIS — E876 Hypokalemia: Secondary | ICD-10-CM | POA: Diagnosis not present

## 2019-09-16 DIAGNOSIS — I63012 Cerebral infarction due to thrombosis of left vertebral artery: Secondary | ICD-10-CM | POA: Diagnosis not present

## 2019-09-16 DIAGNOSIS — I471 Supraventricular tachycardia: Secondary | ICD-10-CM | POA: Diagnosis not present

## 2019-09-16 DIAGNOSIS — C9141 Hairy cell leukemia, in remission: Secondary | ICD-10-CM | POA: Diagnosis not present

## 2019-09-16 DIAGNOSIS — I5032 Chronic diastolic (congestive) heart failure: Secondary | ICD-10-CM | POA: Diagnosis not present

## 2019-09-16 DIAGNOSIS — G6281 Critical illness polyneuropathy: Secondary | ICD-10-CM | POA: Diagnosis not present

## 2019-09-16 DIAGNOSIS — I517 Cardiomegaly: Secondary | ICD-10-CM | POA: Diagnosis not present

## 2019-09-16 DIAGNOSIS — I63412 Cerebral infarction due to embolism of left middle cerebral artery: Secondary | ICD-10-CM | POA: Diagnosis present

## 2019-09-16 DIAGNOSIS — G459 Transient cerebral ischemic attack, unspecified: Secondary | ICD-10-CM | POA: Diagnosis not present

## 2019-09-16 DIAGNOSIS — R29700 NIHSS score 0: Secondary | ICD-10-CM | POA: Diagnosis present

## 2019-09-16 DIAGNOSIS — I472 Ventricular tachycardia: Secondary | ICD-10-CM | POA: Diagnosis not present

## 2019-09-16 DIAGNOSIS — A021 Salmonella sepsis: Secondary | ICD-10-CM | POA: Diagnosis not present

## 2019-09-16 DIAGNOSIS — K219 Gastro-esophageal reflux disease without esophagitis: Secondary | ICD-10-CM | POA: Diagnosis present

## 2019-09-16 DIAGNOSIS — G319 Degenerative disease of nervous system, unspecified: Secondary | ICD-10-CM | POA: Diagnosis not present

## 2019-09-16 DIAGNOSIS — D65 Disseminated intravascular coagulation [defibrination syndrome]: Secondary | ICD-10-CM | POA: Diagnosis not present

## 2019-09-16 DIAGNOSIS — N1832 Chronic kidney disease, stage 3b: Secondary | ICD-10-CM | POA: Diagnosis present

## 2019-09-16 DIAGNOSIS — Z03818 Encounter for observation for suspected exposure to other biological agents ruled out: Secondary | ICD-10-CM | POA: Diagnosis not present

## 2019-09-16 DIAGNOSIS — I63019 Cerebral infarction due to thrombosis of unspecified vertebral artery: Secondary | ICD-10-CM | POA: Diagnosis not present

## 2019-09-16 DIAGNOSIS — F419 Anxiety disorder, unspecified: Secondary | ICD-10-CM | POA: Diagnosis present

## 2019-09-16 DIAGNOSIS — D509 Iron deficiency anemia, unspecified: Secondary | ICD-10-CM | POA: Diagnosis present

## 2019-09-16 DIAGNOSIS — N179 Acute kidney failure, unspecified: Secondary | ICD-10-CM | POA: Diagnosis present

## 2019-09-16 DIAGNOSIS — R4020 Unspecified coma: Secondary | ICD-10-CM | POA: Diagnosis not present

## 2019-09-16 DIAGNOSIS — R410 Disorientation, unspecified: Secondary | ICD-10-CM | POA: Diagnosis not present

## 2019-09-16 DIAGNOSIS — A4152 Sepsis due to Pseudomonas: Principal | ICD-10-CM | POA: Diagnosis present

## 2019-09-16 DIAGNOSIS — T380X5A Adverse effect of glucocorticoids and synthetic analogues, initial encounter: Secondary | ICD-10-CM | POA: Diagnosis present

## 2019-09-16 DIAGNOSIS — N401 Enlarged prostate with lower urinary tract symptoms: Secondary | ICD-10-CM | POA: Diagnosis present

## 2019-09-16 DIAGNOSIS — D696 Thrombocytopenia, unspecified: Secondary | ICD-10-CM | POA: Diagnosis present

## 2019-09-16 DIAGNOSIS — N3000 Acute cystitis without hematuria: Secondary | ICD-10-CM | POA: Diagnosis not present

## 2019-09-16 DIAGNOSIS — I63512 Cerebral infarction due to unspecified occlusion or stenosis of left middle cerebral artery: Secondary | ICD-10-CM | POA: Diagnosis not present

## 2019-09-16 LAB — DIFFERENTIAL
Abs Immature Granulocytes: 2.46 10*3/uL — ABNORMAL HIGH (ref 0.00–0.07)
Basophils Absolute: 0.2 10*3/uL — ABNORMAL HIGH (ref 0.0–0.1)
Basophils Relative: 0 %
Eosinophils Absolute: 0 10*3/uL (ref 0.0–0.5)
Eosinophils Relative: 0 %
Immature Granulocytes: 7 %
Lymphocytes Relative: 1 %
Lymphs Abs: 0.5 10*3/uL — ABNORMAL LOW (ref 0.7–4.0)
Monocytes Absolute: 4.7 10*3/uL — ABNORMAL HIGH (ref 0.1–1.0)
Monocytes Relative: 13 %
Neutro Abs: 28.3 10*3/uL — ABNORMAL HIGH (ref 1.7–7.7)
Neutrophils Relative %: 79 %

## 2019-09-16 LAB — COMPREHENSIVE METABOLIC PANEL
ALT: 27 U/L (ref 0–44)
AST: 24 U/L (ref 15–41)
Albumin: 2.8 g/dL — ABNORMAL LOW (ref 3.5–5.0)
Alkaline Phosphatase: 51 U/L (ref 38–126)
Anion gap: 11 (ref 5–15)
BUN: 23 mg/dL (ref 8–23)
CO2: 25 mmol/L (ref 22–32)
Calcium: 8.4 mg/dL — ABNORMAL LOW (ref 8.9–10.3)
Chloride: 97 mmol/L — ABNORMAL LOW (ref 98–111)
Creatinine, Ser: 1.72 mg/dL — ABNORMAL HIGH (ref 0.61–1.24)
GFR calc Af Amer: 39 mL/min — ABNORMAL LOW (ref >60)
GFR calc non Af Amer: 34 mL/min — ABNORMAL LOW (ref >60)
Glucose, Bld: 97 mg/dL (ref 70–99)
Potassium: 4.2 mmol/L (ref 3.5–5.1)
Sodium: 133 mmol/L — ABNORMAL LOW (ref 135–145)
Total Bilirubin: 1.3 mg/dL — ABNORMAL HIGH (ref 0.3–1.2)
Total Protein: 4.9 g/dL — ABNORMAL LOW (ref 6.5–8.1)

## 2019-09-16 LAB — I-STAT CHEM 8, ED
BUN: 26 mg/dL — ABNORMAL HIGH (ref 8–23)
Calcium, Ion: 1.09 mmol/L — ABNORMAL LOW (ref 1.15–1.40)
Chloride: 94 mmol/L — ABNORMAL LOW (ref 98–111)
Creatinine, Ser: 1.6 mg/dL — ABNORMAL HIGH (ref 0.61–1.24)
Glucose, Bld: 95 mg/dL (ref 70–99)
HCT: 35 % — ABNORMAL LOW (ref 39.0–52.0)
Hemoglobin: 11.9 g/dL — ABNORMAL LOW (ref 13.0–17.0)
Potassium: 4 mmol/L (ref 3.5–5.1)
Sodium: 133 mmol/L — ABNORMAL LOW (ref 135–145)
TCO2: 28 mmol/L (ref 22–32)

## 2019-09-16 LAB — CBC
HCT: 33.6 % — ABNORMAL LOW (ref 39.0–52.0)
Hemoglobin: 10.6 g/dL — ABNORMAL LOW (ref 13.0–17.0)
MCH: 25.7 pg — ABNORMAL LOW (ref 26.0–34.0)
MCHC: 31.5 g/dL (ref 30.0–36.0)
MCV: 81.6 fL (ref 80.0–100.0)
Platelets: 62 10*3/uL — ABNORMAL LOW (ref 150–400)
RBC: 4.12 MIL/uL — ABNORMAL LOW (ref 4.22–5.81)
RDW: 18.3 % — ABNORMAL HIGH (ref 11.5–15.5)
WBC: 36.1 10*3/uL — ABNORMAL HIGH (ref 4.0–10.5)
nRBC: 0.3 % — ABNORMAL HIGH (ref 0.0–0.2)

## 2019-09-16 LAB — URINALYSIS, ROUTINE W REFLEX MICROSCOPIC
Bilirubin Urine: NEGATIVE
Glucose, UA: NEGATIVE mg/dL
Ketones, ur: NEGATIVE mg/dL
Nitrite: NEGATIVE
Protein, ur: NEGATIVE mg/dL
Specific Gravity, Urine: 1.014 (ref 1.005–1.030)
WBC, UA: >50 WBC/hpf — ABNORMAL HIGH (ref 0–5)
pH: 5 (ref 5.0–8.0)

## 2019-09-16 LAB — APTT: aPTT: 32 seconds (ref 24–36)

## 2019-09-16 LAB — LACTIC ACID, PLASMA
Lactic Acid, Venous: 1.7 mmol/L (ref 0.5–1.9)
Lactic Acid, Venous: 1.4 mmol/L (ref 0.5–1.9)

## 2019-09-16 LAB — CBG MONITORING, ED: Glucose-Capillary: 78 mg/dL (ref 70–99)

## 2019-09-16 LAB — SARS CORONAVIRUS 2 BY RT PCR (HOSPITAL ORDER, PERFORMED IN ~~LOC~~ HOSPITAL LAB): SARS Coronavirus 2: NEGATIVE

## 2019-09-16 LAB — PROTIME-INR
INR: 1.1 (ref 0.8–1.2)
Prothrombin Time: 13.8 seconds (ref 11.4–15.2)

## 2019-09-16 MED ORDER — FLUOCINONIDE 0.05 % EX SOLN: 1.0000 "application " | Freq: Two times a day (BID) | CUTANEOUS | Status: DC | PRN

## 2019-09-16 MED ORDER — ACETAMINOPHEN 325 MG PO TABS
650.0000 mg | ORAL_TABLET | ORAL | Status: DC | PRN
  Administered 2019-09-17: 650 mg via ORAL
  Filled 2019-09-16: qty 2

## 2019-09-16 MED ORDER — ACETAMINOPHEN 160 MG/5ML PO SOLN: 650.0000 mg | ORAL | Status: DC | PRN

## 2019-09-16 MED ORDER — SENNOSIDES-DOCUSATE SODIUM 8.6-50 MG PO TABS: 1.0000 | ORAL_TABLET | Freq: Every evening | ORAL | Status: DC | PRN

## 2019-09-16 MED ORDER — SODIUM CHLORIDE 0.9 % IV SOLN
1.0000 g | Freq: Once | INTRAVENOUS | Status: AC
  Administered 2019-09-16: 1 g via INTRAVENOUS
  Filled 2019-09-16: qty 10

## 2019-09-16 MED ORDER — PREDNISONE 20 MG PO TABS
40.0000 mg | ORAL_TABLET | Freq: Every day | ORAL | Status: DC
  Administered 2019-09-17 – 2019-09-21 (×5): 40 mg via ORAL
  Filled 2019-09-16 (×5): qty 2

## 2019-09-16 MED ORDER — VITAMIN D 25 MCG (1000 UNIT) PO TABS
2000.0000 [IU] | ORAL_TABLET | Freq: Every evening | ORAL | Status: DC
  Administered 2019-09-16 – 2019-09-20 (×5): 2000 [IU] via ORAL
  Filled 2019-09-16 (×5): qty 2

## 2019-09-16 MED ORDER — TRIAMCINOLONE ACETONIDE 0.1 % EX CREA
1.0000 "application " | TOPICAL_CREAM | Freq: Two times a day (BID) | CUTANEOUS | Status: DC | PRN
  Filled 2019-09-16: qty 15

## 2019-09-16 MED ORDER — STROKE: EARLY STAGES OF RECOVERY BOOK
Freq: Once | Status: AC
  Filled 2019-09-16: qty 1

## 2019-09-16 MED ORDER — FUROSEMIDE 40 MG PO TABS: 40.0000 mg | ORAL_TABLET | Freq: Every day | ORAL | Status: DC

## 2019-09-16 MED ORDER — TIMOLOL MALEATE 0.5 % OP SOLN
1.0000 [drp] | Freq: Every day | OPHTHALMIC | Status: DC
  Administered 2019-09-17 – 2019-09-21 (×5): 1 [drp] via OPHTHALMIC
  Filled 2019-09-16: qty 5

## 2019-09-16 MED ORDER — SODIUM CHLORIDE 0.9% FLUSH
3.0000 mL | Freq: Once | INTRAVENOUS | Status: AC
Start: 2019-09-16 — End: 2019-09-16
  Administered 2019-09-16: 3 mL via INTRAVENOUS

## 2019-09-16 MED ORDER — FUROSEMIDE 40 MG PO TABS
40.0000 mg | ORAL_TABLET | Freq: Once | ORAL | Status: AC
  Administered 2019-09-16: 40 mg via ORAL
  Filled 2019-09-16: qty 1

## 2019-09-16 MED ORDER — SODIUM CHLORIDE 0.9 % IV SOLN: INTRAVENOUS | Status: DC

## 2019-09-16 MED ORDER — LATANOPROST 0.005 % OP SOLN
1.0000 [drp] | Freq: Every day | OPHTHALMIC | Status: DC
  Administered 2019-09-16 – 2019-09-20 (×5): 1 [drp] via OPHTHALMIC
  Filled 2019-09-16: qty 2.5

## 2019-09-16 MED ORDER — SODIUM CHLORIDE 0.9 % IV SOLN
2.0000 g | INTRAVENOUS | Status: DC
  Filled 2019-09-16: qty 20

## 2019-09-16 MED ORDER — ATORVASTATIN CALCIUM 10 MG PO TABS
20.0000 mg | ORAL_TABLET | Freq: Every day | ORAL | Status: DC
  Administered 2019-09-16 – 2019-09-20 (×5): 20 mg via ORAL
  Filled 2019-09-16 (×5): qty 2

## 2019-09-16 MED ORDER — TAMSULOSIN HCL 0.4 MG PO CAPS
0.8000 mg | ORAL_CAPSULE | Freq: Every day | ORAL | Status: DC
  Administered 2019-09-16 – 2019-09-20 (×5): 0.8 mg via ORAL
  Filled 2019-09-16 (×6): qty 2

## 2019-09-16 MED ORDER — ACETAMINOPHEN 650 MG RE SUPP: 650.0000 mg | RECTAL | Status: DC | PRN

## 2019-09-16 MED ORDER — HYDROCORTISONE 1 % EX CREA
TOPICAL_CREAM | Freq: Two times a day (BID) | CUTANEOUS | Status: DC
  Filled 2019-09-16: qty 28

## 2019-09-16 MED ORDER — SODIUM CHLORIDE 0.9 % IV BOLUS
1000.0000 mL | Freq: Once | INTRAVENOUS | Status: AC
  Administered 2019-09-16: 1000 mL via INTRAVENOUS

## 2019-09-16 MED ORDER — SERTRALINE HCL 50 MG PO TABS
50.0000 mg | ORAL_TABLET | Freq: Every evening | ORAL | Status: DC
  Administered 2019-09-16 – 2019-09-20 (×5): 50 mg via ORAL
  Filled 2019-09-16 (×6): qty 1

## 2019-09-16 MED ORDER — PANTOPRAZOLE SODIUM 40 MG PO TBEC
40.0000 mg | DELAYED_RELEASE_TABLET | Freq: Every day | ORAL | Status: DC
  Administered 2019-09-16 – 2019-09-21 (×6): 40 mg via ORAL
  Filled 2019-09-16 (×6): qty 1

## 2019-09-16 NOTE — Consult Note (Signed)
Neurology Consultation  Reason for Consult: Code stroke, aphasia, right facial droop Referring Physician: Dr. Noemi Chapel, ED provider  CC: Right facial droop, aphasia-code stroke  History is obtained from: EMS, chart, patient's wife  HPI: [name] is a 86 y.o. male past medical history of coronary artery disease, colon cancer, degenerative joint disease, leukemia, aortic stenosis status post TAVR on 09/06/2019, brought by EMS from home where he was with his wife who noted that he had sudden onset of difficulty with his speech as well as right facial droop that was noted sometime this morning.  EMS brought him with a last known well time of 10 AM but upon speaking with the wife over the phone, she reports that he has been not feeling well for at least the past 2 to 3 days, most of the problems have been with his urinary catheter and urinary retention.  There was plans of starting him on antibiotics for possible UTI. Upon EMS evaluation of the patient at home, he had a fever of 101 F and on arrival here in the emergency room his systolic blood pressures in the 90s.  Palpable blood pressure by EMS was in the 130s. On my examination here-I examined him on the bridge, with no focal neurological deficits with the exception of diminished auditory acuity bilaterally.  NIH stroke scale 0.  LKW: Couple of days ago with some acute worsening at 10 AM today tpa given?: no, outside the window Premorbid modified Rankin scale (mRS): 3  ROS: ROS performed and negative except noted in HPI.  Past Medical History:  Diagnosis Date  . Acute appendicitis   . Anemia   . Anxiety   . Arthritis    "back" (03/14/2014)  . Blood dyscrasia    hairy cell leukemia  . CAD (coronary artery disease)    a. 03/14/14  s/p overlapping DES x2 to mid-distal RCA.  . 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 Colon cancer ([place]) 1984  . Compression fracture of lumbar spine, non-traumatic ([place])   . DJD  (degenerative joint disease) of lumbar spine   . Dyslipidemia   . Dyspnea   . Elevated PSA   . GERD (gastroesophageal reflux disease)   . Glaucoma   . Hairy cell leukemia ([place]) dx'd 1980  . Heart murmur   . History of blood transfusion    "several; related to hairy cell leukemia & tx "  . History of stomach ulcers 1968  . Hypertension   . Leukemia, hairy cell ([place])   . Malnutrition ([place])   . Osteoarthritis   . Osteoporosis   . Pneumonia   . 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 Angelena Form and Cyndia Bent.   . Severe aortic stenosis    s/p tavr  . Skin cancer of face     Family History  Problem Relation Age of Onset  . Congestive Heart Failure Father 83  . Stroke Mother   . Diabetes Mellitus II Brother   . Prostate cancer Brother   . Heart Problems Brother        CABG   Social History:   reports that he has quit smoking. His smoking use included cigarettes and cigars. He has a 0.25 pack-year smoking history. He has never used smokeless tobacco. He reports current alcohol use of about 9.0 standard drinks of alcohol per week. He reports that he does not use drugs.  Medications  Current Facility-Administered Medications:  .  sodium chloride flush (NS) 0.9 % injection 3 mL, 3 mL, Intravenous, Once, Lajean Saver, MD  Current Outpatient Medications:  .  amoxicillin (AMOXIL) 500 MG tablet, Take 4 capsules (2,000 mg) one hour prior to all dental visits., Disp: 8 tablet, Rfl: 11 .  atorvastatin (LIPITOR) 20 MG tablet, TAKE 1 TABLET BY MOUTH DAILY AT 6 PM (Patient taking differently: Take 20 mg by mouth daily at 6 PM. ), Disp: 90 tablet, Rfl: 3 .  Cholecalciferol (VITAMIN D3) 50 MCG (2000 UT) TABS, Take 2,000 Units by mouth every evening., Disp: , Rfl:  .  desonide (DESOWEN) 0.05 % cream, Apply 1 application topically 2 (two) times daily as needed (rash/irritation.). , Disp: , Rfl:  .  fluocinonide  (LIDEX) 0.05 % external solution, Apply 1 application topically 2 (two) times daily as needed (apply to ears). , Disp: , Rfl: 11 .  furosemide (LASIX) 40 MG tablet, Take 1 tablet (40 mg total) by mouth daily., Disp: 90 tablet, Rfl: 3 .  latanoprost (XALATAN) 0.005 % ophthalmic solution, Place 1 drop into both eyes at bedtime. , Disp: , Rfl:  .  metoprolol tartrate (LOPRESSOR) 25 MG tablet, Take 0.5 tablets (12.5 mg total) by mouth daily., Disp: 90 tablet, Rfl: 2 .  Multiple Vitamins-Minerals (PRESERVISION AREDS 2 PO), Take 1 capsule by mouth 2 (two) times daily. , Disp: , Rfl:  .  nitroGLYCERIN (NITROSTAT) 0.4 MG SL tablet, Place 1 tablet (0.4 mg total) under the tongue every 5 (five) minutes as needed for chest pain. Please make yearly appt with Dr. Tamala Julian for September. 1st attempt, Disp: 25 tablet, Rfl: 2 .  pantoprazole (PROTONIX) 40 MG tablet, TAKE 1 TABLET BY MOUTH DAILY GENERIC EQUIVALENT FOR PROTONIX, Disp: 90 tablet, Rfl: 2 .  potassium chloride (KLOR-CON) 10 MEQ tablet, Take 1 tablet (10 mEq total) by mouth daily., Disp: 90 tablet, Rfl: 3 .  predniSONE (DELTASONE) 10 MG tablet, TAKE 4 TABLETS(40 MG) BY MOUTH DAILY WITH BREAKFAST, Disp: 120 tablet, Rfl: 1 .  ranibizumab (LUCENTIS) 0.5 MG/0.05ML SOLN, 0.5 mg by Intravitreal route every 28 (twenty-eight) days. Left Eye ONLY, Disp: , Rfl:  .  sertraline (ZOLOFT) 50 MG tablet, Take 50 mg by mouth every evening. , Disp: , Rfl:  .  tamsulosin (FLOMAX) 0.4 MG CAPS capsule, Take 0.8 mg by mouth daily after supper. , Disp: , Rfl:  .  timolol (BETIMOL) 0.5 % ophthalmic solution, Place 1 drop into the right eye daily. , Disp: , Rfl:  .  triamcinolone cream (KENALOG) 0.1 %, Apply 1 application topically 2 (two) times daily as needed (itching). , Disp: , Rfl:   Exam: Current vital signs: BP (!) 118/102  Vital signs in last 24 hours: BP: (118)/(102) 118/102 (07/16 1150) General: Awake alert in no distress HEENT: Normocephalic atraumatic Lungs:  Clear Abdomen: Suprapubic tenderness Extremities warm well perfused with pedal edema bilaterally Neurological exam Awake alert oriented x3 Speech is not dysarthric No evidence of aphasia Cranial nerves: Pupils equal round react light, extraocular movements intact, visual field full, no facial asymmetry noted on my exam here, facial sensation intact, auditory acuity moderately diminished bilaterally, tongue and palate midline. Motor exam: He is antigravity full strength in all 4 extremities. Sensory exam: Is intact to touch all over with no extinction on double simultaneous stimulation Coordination: No evidence of dysmetria on finger-nose-finger testing NIH stroke scale-0  Labs I have reviewed labs in epic and the results pertinent to this consultation  are: Last labs from 09/13/2019 showed leukocytosis, anemia. CMP from today shows mild hyponatremia, creatinine 1.6, BUN 26. He has chronic thrombocytopenia-last platelet count was 56,000 on 09/13/2019. CBC    Component Value Date/Time   WBC 19.7 (H) 09/13/2019 1102   WBC 26.0 (H) 09/09/2019 0218   RBC 4.27 09/13/2019 1102   HGB 11.9 (L) 09/16/2019 1151   HGB 11.2 (L) 09/13/2019 1102   HGB 11.9 (L) 08/15/2019 0808   HCT 35.0 (L) 09/16/2019 1151   HCT 35.2 (L) 08/15/2019 0808   PLT 56 (L) 09/13/2019 1102   PLT 11 (LL) 08/15/2019 0808   MCV 80.6 09/13/2019 1102   MCV 79 08/15/2019 0808   MCH 26.2 09/13/2019 1102   MCHC 32.6 09/13/2019 1102   RDW 18.3 (H) 09/13/2019 1102   RDW 16.3 (H) 08/15/2019 0808   LYMPHSABS 0.4 (L) 09/13/2019 1102   LYMPHSABS 1.3 09/17/2016 1612   MONOABS 1.9 (H) 09/13/2019 1102   EOSABS 0.0 09/13/2019 1102   EOSABS 0.2 09/17/2016 1612   BASOSABS 0.0 09/13/2019 1102   BASOSABS 0.1 09/17/2016 1612    CMP     Component Value Date/Time   NA 133 (L) 09/16/2019 1151   NA 138 06/15/2019 1309   K 4.0 09/16/2019 1151   CL 94 (L) 09/16/2019 1151   CO2 21 (L) 09/09/2019 0218   GLUCOSE 95 09/16/2019 1151    BUN 26 (H) 09/16/2019 1151   BUN 19 06/15/2019 1309   CREATININE 1.60 (H) 09/16/2019 1151   CREATININE 1.09 08/02/2019 0842   CREATININE 1.06 12/10/2015 0949   CALCIUM 8.5 (L) 09/09/2019 0218   PROT 5.7 (L) 09/02/2019 0847   ALBUMIN 3.3 (L) 09/02/2019 0847   AST 20 09/02/2019 0847   AST 17 08/02/2019 0842   ALT 21 09/02/2019 0847   ALT 22 08/02/2019 0842   ALKPHOS 64 09/02/2019 0847   BILITOT 1.0 09/02/2019 0847   BILITOT 0.7 08/02/2019 0842   GFRNONAA >60 09/09/2019 0218   GFRNONAA 58 (L) 08/02/2019 0842   GFRAA >60 09/09/2019 0218   GFRAA >60 08/02/2019 0842    Lipid Panel     Component Value Date/Time   CHOL 122 (L) 12/10/2015 0949   TRIG 154 (H) 12/10/2015 0949   HDL 33 (L) 12/10/2015 0949   CHOLHDL 3.7 12/10/2015 0949   VLDL 31 (H) 12/10/2015 0949   LDLCALC 58 12/10/2015 0949     Imaging I have reviewed the images obtained: CT head shows a acute to subacute-looking left frontal wedge-shaped area of infarct.  No hyperdense vessels.  Aspects 8.  Generalized atrophy.  Assessment:  86 year old man with a recent TAVR, recent complaints of urinary retention and possible UTI presenting for transient right facial droop, aphasia and difficulty understanding and following commands. On my examination-NIH stroke scale 0. Wife reports significant urinary retention and discomfort. Leukocytosis noted on 09/13/2019. Plans presumably outpatient or to start him on antibiotics for a possible UTI. Noted to be hypotensive in the emergency room. Likely acute to subacute looking ischemic infarct in the left frontal area-secondary to a cardioembolic source given extensive cardiac history and recent history of procedure. No acute intervention at this time-not a TPA candidate due to probably being outside the window and the CT showing an established infarct.  Not a candidate for EVT again due to being outside the window and CT showing an acute to subacute looking established infarct.  I think  his current more acute problem, then the stroke is possible underlying sepsis.  He  needs immediate attention for that.  Impression: Acute ischemic stroke-cardioembolic versus secondary to hypoperfusion/hypotension in the setting of sepsis Evaluate for sepsis   Recommendations: Admit to hospitalist Sepsis work-up per ER and hospitalist Telemetry Frequent neurochecks MRI brain if possible over the recent TAVR done. MRA head and carotid ultrasounds. Trying to avoid CTA and giving him dye because of his deranged renal function. He is on aspirin-I would continue that for now. Antiplatelet discussion will need to be had with cardiology given chronic thrombocytopenia and recent TAVR. Most recent TAVR done on 09/07/2018 21-day 1 post TAVR showed normal transaortic gradients, LVEF of 65 to 70%. Since etiology of the stroke is unclear-I would recommend repeating the echo specifically looking for LV thrombus versus any vegetations on the valves. PT OT speech therapy N.p.o. until cleared by bedside stroke screen or swallow evaluation. I would try to keep his blood pressures higher-at least in the normal range (ideally between 140-220) if possible and as much permitted by his current cardiac status to allow for permissive hypertension-hypotension should be avoided at all costs. Check A1c and lipid panel. Discussed my plan with Dr. Sabra Heck. Stroke team will follow with you  -- Amie Portland, MD [facility] Pager: [phone] If 7pm to 7am, please call on call as listed on AMION.

## 2019-09-16 NOTE — Code Documentation (Addendum)
Pt is 86 yr old male who initially was thought to be LSN today at 1000, at which time he demonstrated rt facial droop and aphasia while at the urologist's office. An ambulance was called, and code stroke was pre-activated at 1137. Pt [facility] at 1141 via [facility]. At that time, he had no deficits other than was very HOH. (NIHSS was 0). He was taken to CT at 1146.  Weight was 79.2 kg. CBG was 78. Epic reveals that pt underwent a TAVR procedure on 09/06/19. He is on ASA and known to be thrombocytopenic. Dr Rory Percy called pt's wife, and she states that pt actually has not been "acting right" for a few days. Pt was noted to be hypotensive with a SBP in 80s. NS fluid bolus began by RRN, and MD notified. Per Dr Rory Percy, NCCT shows a subacute infarct. As pt's true LKW was actually a few days ago, he is not a candidate for TPA or endovascular treatment, and no longer meets critieria for a "code stroke". Code stroke cancelled per Dr Johny Chess order at 1214. Beside handoff done with [facility].

## 2019-09-16 NOTE — Telephone Encounter (Signed)
This encounter was created in error - please disregard.

## 2019-09-16 NOTE — Telephone Encounter (Signed)
Will send to triage for further evaluation

## 2019-09-16 NOTE — Telephone Encounter (Signed)
Left message for the pt wife to call back.

## 2019-09-16 NOTE — H&P (Signed)
History and Physical    [name] WSF:[id number] DOB: Jan 18, 1924 DOA: 09/16/2019  PCP: [name], MD (Confirm with patient/family/NH records and if not entered, this has to be entered at [facility] point of entry) Patient coming from: Home  I have personally briefly reviewed patient's old medical records in [place]  Chief Complaint: AMS  HPI: [name] is a 86 y.o. male with medical history significant of AS s/p TAVR on 09/06/2019, chronic thrombocytopenia on steroid and Hx of hairy cell lymphoma s/p splenectomy, chronic leukocytosis, BPH, presented with altered mental status.  Patient was confused, most of the history provided by his wife at bedside.  Wife reported that patient was doing well after the TAVR procedure. Patient was on aspirin on discharge, however later discontinued because patient developed large bruises and hematoma on the right groin and his platelet count dropped.  Also on last admission, patient developed urinary retention during hospital stay last time and was discharged on Foley.  2 days ago Foley was discontinued by urologist in the office, on same day, patient went to her cardiologist for follow-up who started him on amoxicillin for SBE prophylaxis.  Patient complained about feeling tired whole yesterday, and started to complain of feeling cold last night and trouble urinating.  Wife took his temperature in the middle night which read 101.  Wife contacted cardiology this morning and was directed to urologist office and Foley was placed in and urine looks cloudy.  While in urologist office patient was found to have a right facial droop and acute aphasia and sent to the EMS. ED Course: CT showed large left-sided MCA acute subacute infarction.  UA showed signs of UTI.  WBC 36, platelet 62.  Review of Systems: Unable to obtain patient lethargic.  Past Medical History:  Diagnosis Date  . Acute appendicitis   . Anemia   . Anxiety   . Arthritis    "back"  (03/14/2014)  . Blood dyscrasia    hairy cell leukemia  . CAD (coronary artery disease)    a. 03/14/14  s/p overlapping DES x2 to mid-distal RCA.  . 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 Colon cancer ([place]) 1984  . Compression fracture of lumbar spine, non-traumatic ([place])   . DJD (degenerative joint disease) of lumbar spine   . Dyslipidemia   . Dyspnea   . Elevated PSA   . GERD (gastroesophageal reflux disease)   . Glaucoma   . Hairy cell leukemia ([place]) dx'd 1980  . Heart murmur   . History of blood transfusion    "several; related to hairy cell leukemia & tx "  . History of stomach ulcers 1968  . Hypertension   . Leukemia, hairy cell ([place])   . Malnutrition ([place])   . Osteoarthritis   . Osteoporosis   . Pneumonia   . 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 Angelena Form and Cyndia Bent.   . Severe aortic stenosis    s/p tavr  . Skin cancer of face     Past Surgical History:  Procedure Laterality Date  . APPENDECTOMY  10/2007  . CATARACT EXTRACTION, BILATERAL Bilateral 02/2008  . COLON SURGERY  1984   "sigmoid; open"  . CORONARY ANGIOPLASTY WITH STENT PLACEMENT  03/14/2014   "2"  . EYE SURGERY Bilateral    cataract  . FRACTURE SURGERY    . LEFT HEART CATHETERIZATION WITH CORONARY ANGIOGRAM  N/A 03/14/2014   Procedure: LEFT HEART CATHETERIZATION WITH CORONARY ANGIOGRAM;  Surgeon: Peter M Martinique, MD;  Location: [facility] CATH LAB;  Service: Cardiovascular;  Laterality: N/A;  . LIPOMA EXCISION Right 07/2011   liposarcoma resection; "back"  . MOHS SURGERY Left 02/2011  . MOHS SURGERY  X 3   "all on my face"  . MOLE REMOVAL Left 1985   cheek  . ORIF ANKLE FRACTURE Left 2000  . RIGHT/LEFT HEART CATH AND CORONARY ANGIOGRAPHY N/A 07/26/2019   Procedure: RIGHT/LEFT HEART CATH AND CORONARY ANGIOGRAPHY;  Surgeon: Belva Crome, MD;  Location: [facility] CV LAB;  Service: Cardiovascular;   Laterality: N/A;  . SHOULDER SURGERY  04/2004  . SPLENECTOMY  1992  . TEE WITHOUT CARDIOVERSION N/A 09/06/2019   Procedure: TRANSESOPHAGEAL ECHOCARDIOGRAM (TEE);  Surgeon: Burnell Blanks, MD;  Location: [place] CV LAB;  Service: Open Heart Surgery;  Laterality: N/A;  . TONSILLECTOMY AND ADENOIDECTOMY  1939  . TRANSCATHETER AORTIC VALVE REPLACEMENT, TRANSFEMORAL N/A 09/06/2019   Procedure: TRANSCATHETER AORTIC VALVE REPLACEMENT, TRANSFEMORAL;  Surgeon: Burnell Blanks, MD;  Location: [facility] CV LAB;  Service: Open Heart Surgery;  Laterality: N/A;     reports that he has quit smoking. His smoking use included cigarettes and cigars. He has a 0.25 pack-year smoking history. He has never used smokeless tobacco. He reports current alcohol use of about 9.0 standard drinks of alcohol per week. He reports that he does not use drugs.  Allergies  Allergen Reactions  . Antazoline Anaphylaxis  . Antihistamines, Chlorpheniramine-Type     Nervous, jittery  . Sulfa Antibiotics Rash    Family History  Problem Relation Age of Onset  . Congestive Heart Failure Father 76  . Stroke Mother   . Diabetes Mellitus II Brother   . Prostate cancer Brother   . Heart Problems Brother        CABG     Prior to Admission medications   Medication Sig Start Date End Date Taking? Authorizing Provider  amoxicillin (AMOXIL) 500 MG tablet Take 4 capsules (2,000 mg) one hour prior to all dental visits. 09/14/19   Eileen Stanford, PA-C  atorvastatin (LIPITOR) 20 MG tablet TAKE 1 TABLET BY MOUTH DAILY AT 6 PM Patient taking differently: Take 20 mg by mouth daily at 6 PM.  02/01/19   Belva Crome, MD  Cholecalciferol (VITAMIN D3) 50 MCG (2000 UT) TABS Take 2,000 Units by mouth every evening.    [provider]  desonide (DESOWEN) 0.05 % cream Apply 1 application topically 2 (two) times daily as needed (rash/irritation.).  01/28/16   [provider]  fluocinonide (LIDEX) 0.05 %  external solution Apply 1 application topically 2 (two) times daily as needed (apply to ears).  05/31/15   [provider]  furosemide (LASIX) 40 MG tablet Take 1 tablet (40 mg total) by mouth daily. 09/14/19 09/08/20  Eileen Stanford, PA-C  latanoprost (XALATAN) 0.005 % ophthalmic solution Place 1 drop into both eyes at bedtime.     [provider]  metoprolol tartrate (LOPRESSOR) 25 MG tablet Take 0.5 tablets (12.5 mg total) by mouth daily. 09/08/19   Eileen Stanford, PA-C  Multiple Vitamins-Minerals (PRESERVISION AREDS 2 PO) Take 1 capsule by mouth 2 (two) times daily.     [provider]  nitroGLYCERIN (NITROSTAT) 0.4 MG SL tablet Place 1 tablet (0.4 mg total) under the tongue every 5 (five) minutes as needed for chest pain. Please make yearly appt with Dr. Tamala Julian  for September. 1st attempt 08/12/18   Belva Crome, MD  pantoprazole (PROTONIX) 40 MG tablet TAKE 1 TABLET BY MOUTH DAILY GENERIC EQUIVALENT FOR PROTONIX Patient taking differently: Take 40 mg by mouth daily.  08/11/19   Belva Crome, MD  potassium chloride (KLOR-CON) 10 MEQ tablet Take 1 tablet (10 mEq total) by mouth daily. 09/14/19 09/08/20  Eileen Stanford, PA-C  predniSONE (DELTASONE) 10 MG tablet TAKE 4 TABLETS(40 MG) BY MOUTH DAILY WITH BREAKFAST Patient taking differently: Take 40 mg by mouth daily with breakfast.  08/23/19   Ladell Pier, MD  ranibizumab (LUCENTIS) 0.5 MG/0.05ML SOLN 0.5 mg by Intravitreal route every 28 (twenty-eight) days. Left Eye ONLY 10/11/13   [provider]  sertraline (ZOLOFT) 50 MG tablet Take 50 mg by mouth every evening.     [provider]  tamsulosin (FLOMAX) 0.4 MG CAPS capsule Take 0.8 mg by mouth daily after supper.     [provider]  timolol (BETIMOL) 0.5 % ophthalmic solution Place 1 drop into the right eye daily.     [provider]  triamcinolone cream (KENALOG) 0.1 % Apply 1 application topically 2 (two) times daily as  needed (itching).     [provider]    Physical Exam: Vitals:   09/16/19 1221 09/16/19 1230 09/16/19 1240 09/16/19 1305  BP:  92/67 (!) 110/55 121/60  Pulse:  77 77 76  Resp: 17 18 16 16   Temp:    99.2 F (37.3 C)  TempSrc:      SpO2: 100% 97% 98% 100%  Weight: 79.5 kg     Height:        Constitutional: NAD, calm, comfortable Vitals:   09/16/19 1221 09/16/19 1230 09/16/19 1240 09/16/19 1305  BP:  92/67 (!) 110/55 121/60  Pulse:  77 77 76  Resp: 17 18 16 16   Temp:    99.2 F (37.3 C)  TempSrc:      SpO2: 100% 97% 98% 100%  Weight: 79.5 kg     Height:       Eyes: PERRL, lids and conjunctivae normal ENMT: Mucous membranes are dry. Posterior pharynx clear of any exudate or lesions.Normal dentition.  Neck: normal, supple, no masses, no thyromegaly Respiratory: clear to auscultation bilaterally, no wheezing, no crackles. Normal respiratory effort. No accessory muscle use.  Cardiovascular: Regular rate and rhythm, soft blowing murmur on heart base. No extremity edema. 2+ pedal pulses. No carotid bruits.  Abdomen: no tenderness, no masses palpated. No hepatosplenomegaly. Bowel sounds positive.  Musculoskeletal: no clubbing / cyanosis. No joint deformity upper and lower extremities. Good ROM, no contractures. Normal muscle tone.  Skin: no rashes, lesions, ulcers. No induration Neurologic: Right-sided weakness Psychiatric: Sleepy arousable.  Confused.    Labs on Admission: I have personally reviewed following labs and imaging studies  CBC: Recent Labs  Lab 09/13/19 1102 09/16/19 1146 09/16/19 1151  WBC 19.7* 36.1*  --   NEUTROABS 12.7* 28.3*  --   HGB 11.2* 10.6* 11.9*  HCT 34.4* 33.6* 35.0*  MCV 80.6 81.6  --   PLT 56* 62*  --    Basic Metabolic Panel: Recent Labs  Lab 09/16/19 1146 09/16/19 1151  NA 133* 133*  K 4.2 4.0  CL 97* 94*  CO2 25  --   GLUCOSE 97 95  BUN 23 26*  CREATININE 1.72* 1.60*  CALCIUM 8.4*  --    GFR: Estimated Creatinine  Clearance: 27.5 mL/min (A) (by C-G formula based on SCr of  1.6 mg/dL (H)). Liver Function Tests: Recent Labs  Lab 09/16/19 1146  AST 24  ALT 27  ALKPHOS 51  BILITOT 1.3*  PROT 4.9*  ALBUMIN 2.8*   No results for input(s): LIPASE, AMYLASE in the last 168 hours. No results for input(s): AMMONIA in the last 168 hours. Coagulation Profile: Recent Labs  Lab 09/16/19 1146  INR 1.1   Cardiac Enzymes: No results for input(s): CKTOTAL, CKMB, CKMBINDEX, TROPONINI in the last 168 hours. BNP (last 3 results) No results for input(s): PROBNP in the last 8760 hours. HbA1C: No results for input(s): HGBA1C in the last 72 hours. CBG: Recent Labs  Lab 09/16/19 1146  GLUCAP 78   Lipid Profile: No results for input(s): CHOL, HDL, LDLCALC, TRIG, CHOLHDL, LDLDIRECT in the last 72 hours. Thyroid Function Tests: No results for input(s): TSH, T4TOTAL, FREET4, T3FREE, THYROIDAB in the last 72 hours. Anemia Panel: No results for input(s): VITAMINB12, FOLATE, FERRITIN, TIBC, IRON, RETICCTPCT in the last 72 hours. Urine analysis:    Component Value Date/Time   COLORURINE YELLOW 09/16/2019 1255   APPEARANCEUR CLOUDY (A) 09/16/2019 1255   LABSPEC 1.014 09/16/2019 1255   PHURINE 5.0 09/16/2019 1255   GLUCOSEU NEGATIVE 09/16/2019 1255   HGBUR MODERATE (A) 09/16/2019 1255   BILIRUBINUR NEGATIVE 09/16/2019 [place] 09/16/2019 1255   PROTEINUR NEGATIVE 09/16/2019 1255   NITRITE NEGATIVE 09/16/2019 1255   LEUKOCYTESUR MODERATE (A) 09/16/2019 1255    Radiological Exams on Admission: DG Chest Port 1 View  Result Date: 09/16/2019 CLINICAL DATA:  Postop fever. EXAM: PORTABLE CHEST 1 VIEW COMPARISON:  Radiograph 09/06/2019, CT 07/29/2019 FINDINGS: Post TAVR. Previous right internal jugular pacer is been removed. Battery pack projects over the left chest wall. Stable cardiomegaly. Aortic atherosclerosis. Central left lung mass is unchanged by radiograph. No evidence of acute airspace  disease. No pleural fluid. No pneumothorax. No pulmonary edema. IMPRESSION: 1. No acute abnormality. 2. Stable mild cardiomegaly and left lung mass. Electronically Signed   By: Keith Rake M.D.   On: 09/16/2019 14:02   CT HEAD CODE STROKE WO CONTRAST  Result Date: 09/16/2019 CLINICAL DATA:  Code stroke. Possible stroke, neuro deficit, subacute. Right-sided facial droop, aphasia, last known well 10:00 AM EXAM: CT HEAD WITHOUT CONTRAST TECHNIQUE: Contiguous axial images were obtained from the base of the skull through the vertex without intravenous contrast. COMPARISON:  No pertinent prior studies available for comparison. FINDINGS: Brain: Mild generalized parenchymal atrophy. There is abnormal cortical/subcortical hyperdensity within the mid left frontal lobe and left frontal operculum consistent with acute/early subacute infarction. The infarct measures 2.4 x 3.7 x 3.5 cm. There is mild regional mass effect. No ventricular effacement or midline shift. No evidence of hemorrhagic conversion. Background mild chronic small vessel ischemic disease. No extra-axial fluid collection. No evidence of intracranial mass. No midline shift. Vascular: No definite hyperdense vessel. Atherosclerotic calcifications. Skull: Normal. Negative for fracture or focal lesion. Sinuses/Orbits: Visualized orbits show no acute finding. Bilateral lens replacements. Mild ethmoid and maxillary sinus mucosal thickening. No significant mastoid effusion. ASPECTS ([place] Stroke Program Early CT Score) - Ganglionic level infarction (caudate, lentiform nuclei, internal capsule, insula, M1-M3 cortex): 7 - Supraganglionic infarction (M4-M6 cortex): 1 Total score (0-10 with 10 being normal): 8 These results were called by telephone at the time of interpretation on 09/16/2019 at 12:06 pm to provider Dr. Rory Percy, who verbally acknowledged these results. IMPRESSION: 1. Acute/early subacute ischemic infarction within the left MCA vascular territory  within the mid left frontal lobe and  left frontal operculum. ASPECTS is 8. Mild regional mass effect. No midline shift. No hemorrhagic conversion. 2. Background mild generalized parenchymal atrophy and chronic small vessel ischemic disease 3. Mild ethmoid sinus mucosal thickening. Electronically Signed   By: Kellie Simmering DO   On: 09/16/2019 12:06    EKG: Independently reviewed.  Sinus rhythm, RBBB  Assessment/Plan Active Problems:   Stroke (cerebrum) (HCC)   Sepsis ([place])  (please populate well all problems here in Problem List. (For example, if patient is on BP meds at home and you resume or decide to hold them, it is a problem that needs to be her. Same for CAD, COPD, HLD and so on)  Sepsis, with signs of endorgan damage of encephalopathy and AKI and worsening leukocytosis -Patient does have a UTI.  However given his most recent cardiac intervention/TAVR, recent concern about early endocarditis despite patient on amoxicillin.  Discussed with infection disease attending, as now there is no evidence of endocarditis, will just cover patient for treatment of UTI.  Will increase ceftriaxone to 2 g every 24 hours. -On Foley -Admit to PCU for close monitoring. -Blood culture sent, lactic acid pending, start maintenance IV fluids  Acute ischemic versus embolic stroke -Neurology consultation appreciated.  MRI/MRA ordered. -TTE ordered, discussed with cardiologist, and infection disease attending, may need TEE if more evidence of embolic stroke versus endocarditis develop.  Acute metabolic encephalopathy -Treat sepsis and then reevaluate -Avoid narcotics and sedation  AKI -Probably multifactorial from prerenal and post renal.  Post renal addressed with Foley, for prerenal, patient started on IV fluid -Repeat BMP in the morning  Thrombocytopenia -With large bruise/hematoma in the right groin, discussed with cardiologist, who will come in to evaluate the size of bruits and hematoma, stable patient  should be on antiplatelet ASAP.  Acute urinary retention secondary to BPH -On Foley, expect maintaining for 5 days, then voiding trial  HTN -Hold all BP meds for now, including Lasix    DVT prophylaxis: SCD Code Status: Full Family Communication: Wife at bedside Disposition Plan: Patient has complicated medical conditions of acute stroke, sepsis will likely need 5 to 7 days hospital stay.  Consider start PT evaluation once sepsis resolved. Consults called: Cardiology, neurology, infectious disease Admission status: PCU   Lequita Halt MD [facility] Pager [phone]  09/16/2019, 2:26 PM

## 2019-09-16 NOTE — Consult Note (Signed)
[facility] for Infectious Disease       Reason for Consult: Fever    Referring Physician: Dr. Roosevelt Locks  Active Problems:   Stroke (cerebrum) ([place])   Sepsis ([place])   .  stroke: mapping our early stages of recovery book   Does not apply Once  . atorvastatin  20 mg Oral q1800  . hydrocortisone cream   Topical BID  . latanoprost  1 drop Both Eyes QHS  . pantoprazole  40 mg Oral Daily  . [START ON 09/17/2019] predniSONE  40 mg Oral Q breakfast  . sertraline  50 mg Oral QPM  . sodium chloride flush  3 mL Intravenous Once  . tamsulosin  0.8 mg Oral QPC supper  . timolol  1 drop Right Eye Daily  . Vitamin D3  2,000 Units Oral QPM    Recommendations: Ceftriaxone 2 grams daily  Will monitor blood cultures  Assessment: He has a history of CAD and recent TAVR done on 09/06/19 by Dr. Cyndia Bent for severe, symptomatic AS and discharged on 7/9 and discharged with a foley catheter with urinary retention with foley removal on I believe 7/12 by urology.  He developed acute urinary retention with fever and UA now has blood and WBCs and many bacteria with the urine culture pending.  No other obvious signs of infection.   This is most c/w a UTI.    Antibiotics: Ceftriaxone   HPI: [name] is a 85 y.o. male with a history of severe AS requiring TAVR done on 7/9 complicated by urinary retention who comes in with fever.  He had the foley removed with some episodes of incontinence and then developed some subjective fever.  He was brought in today after being found unresponsive with slurred speech and facial droop which had resolved on arrival.  He also has new A fib that was noted after discharge.  He had been feeling bad for 2-3 days and did have a fever of 101 measured by EMS.  CT for code stroke with a subacute ischemic infarction of the left MCA.  He also has a history of ITP and has been on prednisone and romiplostim.  He also has a history of Hairy cell leukemia s/p splenectomy.  He has  leukocytosis of 36,000 which is higher than his baseline.     Review of Systems:  Constitutional: positive for fevers and chills or negative for anorexia Gastrointestinal: negative for diarrhea Integument/breast: negative for rash All other systems reviewed and are negative    Past Medical History:  Diagnosis Date  . Acute appendicitis   . Anemia   . Anxiety   . Arthritis    "back" (03/14/2014)  . Blood dyscrasia    hairy cell leukemia  . CAD (coronary artery disease)    a. 03/14/14  s/p overlapping DES x2 to mid-distal RCA.  . 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 Colon cancer ([place]) 1984  . Compression fracture of lumbar spine, non-traumatic ([place])   . DJD (degenerative joint disease) of lumbar spine   . Dyslipidemia   . Dyspnea   . Elevated PSA   . GERD (gastroesophageal reflux disease)   . Glaucoma   . Hairy cell leukemia ([place]) dx'd 1980  . Heart murmur   . History of blood transfusion    "several; related to hairy cell leukemia & tx "  . History of stomach ulcers 1968  . Hypertension   . Leukemia, hairy cell ([place])   . Malnutrition (  [place])   . Osteoarthritis   . Osteoporosis   . Pneumonia   . 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 Angelena Form and Cyndia Bent.   . Severe aortic stenosis    s/p tavr  . Skin cancer of face     Social History   Tobacco Use  . Smoking status: Former Smoker    Packs/day: 0.25    Years: 1.00    Pack years: 0.25    Types: Cigarettes, Cigars  . Smokeless tobacco: Never Used  . Tobacco comment: occasional social smoker during college.  Vaping Use  . Vaping Use: Never used  Substance Use Topics  . Alcohol use: Yes    Alcohol/week: 9.0 standard drinks    Types: 2 Glasses of wine, 2 Shots of liquor, 5 Standard drinks or equivalent per week    Comment: socially  . Drug use: No    Family History  Problem Relation Age of Onset  .  Congestive Heart Failure Father 20  . Stroke Mother   . Diabetes Mellitus II Brother   . Prostate cancer Brother   . Heart Problems Brother        CABG    Allergies  Allergen Reactions  . Antazoline Anaphylaxis  . Antihistamines, Chlorpheniramine-Type     Nervous, jittery  . Sulfa Antibiotics Rash    Physical Exam: Constitutional: in no apparent distress  Vitals:   09/16/19 1240 09/16/19 1305  BP: (!) 110/55 121/60  Pulse: 77 76  Resp: 16 16  Temp:  99.2 F (37.3 C)  SpO2: 98% 100%   EYES: anicteric Cardiovascular: Cor RRR Respiratory: clear; Musculoskeletal: no pedal edema noted Skin: negatives: no rash Neuro: non-focal  Lab Results  Component Value Date   WBC 36.1 (H) 09/16/2019   HGB 11.9 (L) 09/16/2019   HCT 35.0 (L) 09/16/2019   MCV 81.6 09/16/2019   PLT 62 (L) 09/16/2019    Lab Results  Component Value Date   CREATININE 1.60 (H) 09/16/2019   BUN 26 (H) 09/16/2019   NA 133 (L) 09/16/2019   K 4.0 09/16/2019   CL 94 (L) 09/16/2019   CO2 25 09/16/2019    Lab Results  Component Value Date   ALT 27 09/16/2019   AST 24 09/16/2019   ALKPHOS 51 09/16/2019     Microbiology: Recent Results (from the past 240 hour(s))  Culture, Urine     Status: None   Collection Time: 09/07/19  6:05 AM   Specimen: Urine, Catheterized  Result Value Ref Range Status   Specimen Description URINE, CATHETERIZED  Final   Special Requests NONE  Final   Culture   Final    NO GROWTH Performed at [facility], [address], [place], [place] [address]    Report Status 09/08/2019 FINAL  Final    Thayer Headings, [place] for Infectious Disease [facility] [email or web address] 09/16/2019, 2:46 PM

## 2019-09-16 NOTE — Telephone Encounter (Signed)
Patient's wife states the patient had a valve replacement last week. She states last night he had pain in his belly and a fever of 101. She states he also has not been urinating much. She states he took at tylenol and his temperature is down to 98, but he is still warm. She would like to know what to do next.

## 2019-09-16 NOTE — Consult Note (Addendum)
Cardiology Consultation:   Patient ID: [name] MRN: [id number]; DOB: October 06, 1923  Admit date: 09/16/2019 Date of Consult: 09/16/2019  Primary Care Provider: [name], MD [facility] Cardiologist: Sinclair Grooms, MD  [place] Electrophysiologist:  None    Patient Profile:   [name] is a 86 y.o. male with a hx of severe AS s/p TAVR with 44mm Edwards Sapien 3 UltraTHV EF 65-70%, 09/09/19, hx of RBBB and LAFB, on ASA alone for thrombocytopenia, urinary retention with UTIs,  Chronic diastolic HF,  Colon cancer, hairy cel leukemia, idiopathic thrombocytopenia (Rx'd with Nplate and steroids), HTN who is being seen today for the evaluation of stroke off anticoagulation at the request of Dr. Roosevelt Locks.  History of Present Illness:   [name] with CAD and AS with L/RHC on 07/26/19 showedchronically occluded mid LAD, moderate disease in the diagonals and mid RCA. Medical therapy recommended for CAD.   He did undergo successful TAVR with 39mm Edwards Sapien 3 UltraTHV via the TF approach on7/6/21. Post operative echoshowed EF 65-70%, normally functioningTAVRwith a mean gradient of 11 mm Hg and no PVL. He was discharged home on Asprin alone given historyof thrombocytopenia. Pt did wear zio patch and it revealed new onset of atrial fib.    He had been with foley cath for urinary retention but removed 09/14/19 now with recurrent urinary retention.  Dr. Benay Spice follows for thrombocytopenia.   Lasix added for acute on chronic diastolic CHF  And lasix 40 and Kdur 10 added.  We were waiting for a fib burden before deciding on long term oral anticoagulation especially with thrombocytopenia would need their input.    Dr. Benay Spice had stopped ASA due to decrease of platelets.    Today was at urologist office and developed rt facial droop and aphasia, EMS called and arrival at [facility] pt was without symptoms.  His SBP was in the 80s, NS started.  His wife notes he has not acted  normal for a few days.  NCCT with subacute infarct.  Not a candidate for TPA, or endovascular treatment.       EKG:  The EKG was personally reviewed and demonstrates:  SR with LA enlargement RBBB LAFB no acute changes  Telemetry:  Telemetry was personally reviewed and demonstrates:  SR  Na 133 K+ 4.2, Cr 1.72  Hgb 10.6 WBC 36.1 and plts 62   CXR FINDINGS: Post TAVR. Previous right internal jugular pacer is been removed. Battery pack projects over the left chest wall. Stable cardiomegaly. Aortic atherosclerosis. Central left lung mass is unchanged by radiograph. No evidence of acute airspace disease. No pleural fluid. No pneumothorax. No pulmonary edema. IMPRESSION: 1. No acute abnormality. 2. Stable mild cardiomegaly and left lung mass.    CT head  IMPRESSION: 1. Acute/early subacute ischemic infarction within the left MCA vascular territory within the mid left frontal lobe and left frontal operculum. ASPECTS is 8. Mild regional mass effect. No midline shift. No hemorrhagic conversion. 2. Background mild generalized parenchymal atrophy and chronic small vessel ischemic disease 3. Mild ethmoid sinus mucosal thickening.  BP 118/102  BP 104/54  P 74 temp 99 He has not had chest pain or SOB.  His wife notes not being himself over last few days.   He is hard of hearing.      Past Medical History:  Diagnosis Date  . Acute appendicitis   . Anemia   . Anxiety   . Arthritis    "back" (03/14/2014)  . Blood  dyscrasia    hairy cell leukemia  . CAD (coronary artery disease)    a. 03/14/14  s/p overlapping DES x2 to mid-distal RCA.  . 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 Colon cancer ([place]) 1984  . Compression fracture of lumbar spine, non-traumatic ([place])   . DJD (degenerative joint disease) of lumbar spine   . Dyslipidemia   . Dyspnea   . Elevated PSA   . GERD (gastroesophageal reflux disease)   . Glaucoma   . Hairy cell leukemia ([place]) dx'd 1980  .  Heart murmur   . History of blood transfusion    "several; related to hairy cell leukemia & tx "  . History of stomach ulcers 1968  . Hypertension   . Leukemia, hairy cell ([place])   . Malnutrition ([place])   . Osteoarthritis   . Osteoporosis   . Pneumonia   . 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 Angelena Form and Cyndia Bent.   . Severe aortic stenosis    s/p tavr  . Skin cancer of face     Past Surgical History:  Procedure Laterality Date  . APPENDECTOMY  10/2007  . CATARACT EXTRACTION, BILATERAL Bilateral 02/2008  . COLON SURGERY  1984   "sigmoid; open"  . CORONARY ANGIOPLASTY WITH STENT PLACEMENT  03/14/2014   "2"  . EYE SURGERY Bilateral    cataract  . FRACTURE SURGERY    . LEFT HEART CATHETERIZATION WITH CORONARY ANGIOGRAM N/A 03/14/2014   Procedure: LEFT HEART CATHETERIZATION WITH CORONARY ANGIOGRAM;  Surgeon: Peter M Martinique, MD;  [facility] C[facility] LAB;  Service: Cardiovascular;  Laterality: N/A;  . LIPOMA EXCISION Right 07/2011   liposarcoma resection; "back"  . MOHS SURGERY Left 02/2011  . MOHS SURGERY  X 3   "all on my face"  . MOLE REMOVAL Left 1985   cheek  . ORIF ANKLE FRACTURE Left 2000  . RIGHT/LEFT HEART CATH AND CORONARY ANGIOGRAPHY N/A 07/26/2019   Procedure: RIGHT/LEFT HEART CATH AND CORONARY ANGIOGRAPHY;  Surgeon: Belva Crome, MD;  Location: [place] CV LAB;  Service: Cardiovascular;  Laterality: N/A;  . SHOULDER SURGERY  04/2004  . SPLENECTOMY  1992  . TEE WITHOUT CARDIOVERSION N/A 09/06/2019   Procedure: TRANSESOPHAGEAL ECHOCARDIOGRAM (TEE);  Surgeon: Burnell Blanks, MD;  Location: [place] CV LAB;  Service: Open Heart Surgery;  Laterality: N/A;  . TONSILLECTOMY AND ADENOIDECTOMY  1939  . TRANSCATHETER AORTIC VALVE REPLACEMENT, TRANSFEMORAL N/A 09/06/2019   Procedure: TRANSCATHETER AORTIC VALVE REPLACEMENT, TRANSFEMORAL;  Surgeon: Burnell Blanks, MD;  Location:  [place] CV LAB;  Service: Open Heart Surgery;  Laterality: N/A;     Home Medications:  Prior to Admission medications   Medication Sig Start Date End Date Taking? Authorizing Provider  amoxicillin (AMOXIL) 500 MG tablet Take 4 capsules (2,000 mg) one hour prior to all dental visits. 09/14/19   Eileen Stanford, PA-C  atorvastatin (LIPITOR) 20 MG tablet TAKE 1 TABLET BY MOUTH DAILY AT 6 PM Patient taking differently: Take 20 mg by mouth daily at 6 PM.  02/01/19   Belva Crome, MD  Cholecalciferol (VITAMIN D3) 50 MCG (2000 UT) TABS Take 2,000 Units by mouth every evening.    [provider]  desonide (DESOWEN) 0.05 % cream Apply 1 application topically 2 (two) times daily as needed (rash/irritation.).  01/28/16   [provider]  fluocinonide (LIDEX) 0.05 % external solution  Apply 1 application topically 2 (two) times daily as needed (apply to ears).  05/31/15   [provider]  furosemide (LASIX) 40 MG tablet Take 1 tablet (40 mg total) by mouth daily. 09/14/19 09/08/20  Eileen Stanford, PA-C  latanoprost (XALATAN) 0.005 % ophthalmic solution Place 1 drop into both eyes at bedtime.     [provider]  metoprolol tartrate (LOPRESSOR) 25 MG tablet Take 0.5 tablets (12.5 mg total) by mouth daily. 09/08/19   Eileen Stanford, PA-C  Multiple Vitamins-Minerals (PRESERVISION AREDS 2 PO) Take 1 capsule by mouth 2 (two) times daily.     [provider]  nitroGLYCERIN (NITROSTAT) 0.4 MG SL tablet Place 1 tablet (0.4 mg total) under the tongue every 5 (five) minutes as needed for chest pain. Please make yearly appt with Dr. Tamala Julian for September. 1st attempt 08/12/18   Belva Crome, MD  pantoprazole (PROTONIX) 40 MG tablet TAKE 1 TABLET BY MOUTH DAILY GENERIC EQUIVALENT FOR PROTONIX Patient taking differently: Take 40 mg by mouth daily.  08/11/19   Belva Crome, MD  potassium chloride (KLOR-CON) 10 MEQ tablet Take 1 tablet (10 mEq total) by mouth daily.  09/14/19 09/08/20  Eileen Stanford, PA-C  predniSONE (DELTASONE) 10 MG tablet TAKE 4 TABLETS(40 MG) BY MOUTH DAILY WITH BREAKFAST Patient taking differently: Take 40 mg by mouth daily with breakfast.  08/23/19   Ladell Pier, MD  ranibizumab (LUCENTIS) 0.5 MG/0.05ML SOLN 0.5 mg by Intravitreal route every 28 (twenty-eight) days. Left Eye ONLY 10/11/13   [provider]  sertraline (ZOLOFT) 50 MG tablet Take 50 mg by mouth every evening.     [provider]  tamsulosin (FLOMAX) 0.4 MG CAPS capsule Take 0.8 mg by mouth daily after supper.     [provider]  timolol (BETIMOL) 0.5 % ophthalmic solution Place 1 drop into the right eye daily.     [provider]  triamcinolone cream (KENALOG) 0.1 % Apply 1 application topically 2 (two) times daily as needed (itching).     [provider]    Inpatient Medications: Scheduled Meds: .  stroke: mapping our early stages of recovery book   Does not apply Once  . atorvastatin  20 mg Oral q1800  . hydrocortisone cream   Topical BID  . latanoprost  1 drop Both Eyes QHS  . pantoprazole  40 mg Oral Daily  . [START ON 09/17/2019] predniSONE  40 mg Oral Q breakfast  . sertraline  50 mg Oral QPM  . sodium chloride flush  3 mL Intravenous Once  . tamsulosin  0.8 mg Oral QPC supper  . timolol  1 drop Right Eye Daily  . Vitamin D3  2,000 Units Oral QPM   Continuous Infusions: . sodium chloride    . cefTRIAXone (ROCEPHIN)  IV 1 g (09/16/19 1414)  . cefTRIAXone (ROCEPHIN)  IV    . [START ON 09/17/2019] cefTRIAXone (ROCEPHIN)  IV     PRN Meds: acetaminophen **OR** acetaminophen (TYLENOL) oral liquid 160 mg/5 mL **OR** acetaminophen, fluocinonide, senna-docusate, triamcinolone cream  Allergies:    Allergies  Allergen Reactions  . Antazoline Anaphylaxis  . Antihistamines, Chlorpheniramine-Type     Nervous, jittery  . Sulfa Antibiotics Rash    Social History:   Social History   Socioeconomic History  .  Marital status: Married    Spouse name: Not on file  . Number of children: Not on file  . Years of education: Not on file  . Highest  education level: Not on file  Occupational History  . Not on file  Tobacco Use  . Smoking status: Former Smoker    Packs/day: 0.25    Years: 1.00    Pack years: 0.25    Types: Cigarettes, Cigars  . Smokeless tobacco: Never Used  . Tobacco comment: occasional social smoker during college.  Vaping Use  . Vaping Use: Never used  Substance and Sexual Activity  . Alcohol use: Yes    Alcohol/week: 9.0 standard drinks    Types: 2 Glasses of wine, 2 Shots of liquor, 5 Standard drinks or equivalent per week    Comment: socially  . Drug use: No  . Sexual activity: Never  Other Topics Concern  . Not on file  Social History Narrative  . Not on file   Social Determinants of Health   Financial Resource Strain:   . Difficulty of Paying Living Expenses:   Food Insecurity:   . Worried About Charity fundraiser in the Last Year:   . Arboriculturist in the Last Year:   Transportation Needs:   . Film/video editor (Medical):   Marland Kitchen Lack of Transportation (Non-Medical):   Physical Activity:   . Days of Exercise per Week:   . Minutes of Exercise per Session:   Stress:   . Feeling of Stress :   Social Connections:   . Frequency of Communication with Friends and Family:   . Frequency of Social Gatherings with Friends and Family:   . Attends Religious Services:   . Active Member of Clubs or Organizations:   . Attends Archivist Meetings:   Marland Kitchen Marital Status:   Intimate Partner Violence:   . Fear of Current or Ex-Partner:   . Emotionally Abused:   Marland Kitchen Physically Abused:   . Sexually Abused:     Family History:    Family History  Problem Relation Age of Onset  . Congestive Heart Failure Father 59  . Stroke Mother   . Diabetes Mellitus II Brother   . Prostate cancer Brother   . Heart Problems Brother        CABG     ROS:  Please see the  history of present illness.  General:no colds + fevers, today no weight changes Skin:no rashes or ulcers HEENT:no blurred vision, no congestion CV:see HPI PUL:see HPI GI:no diarrhea constipation or melena, no indigestion GU:no hematuria, no dysuria MS:no joint pain, no claudication Neuro:no syncope, no lightheadedness Endo:no diabetes, no thyroid disease  All other ROS reviewed and negative.     Physical Exam/Data:   Vitals:   09/16/19 1221 09/16/19 1230 09/16/19 1240 09/16/19 1305  BP:  92/67 (!) 110/55 121/60  Pulse:  77 77 76  Resp: 17 18 16 16   Temp:    99.2 F (37.3 C)  TempSrc:      SpO2: 100% 97% 98% 100%  Weight: 79.5 kg     Height:        Intake/Output Summary (Last 24 hours) at 09/16/2019 1429 Last data filed at 09/16/2019 1314 Gross per 24 hour  Intake 1000 ml  Output --  Net 1000 ml   Last 3 Weights 09/16/2019 09/14/2019 09/13/2019  Weight (lbs) 175 lb 4.3 oz 173 lb 12.8 oz 173 lb 8 oz  Weight (kg) 79.5 kg 78.835 kg 78.699 kg     Body mass index is 30.08 kg/m.  General:  Well nourished, well developed, in no acute distress HEENT: normal Lymph: no adenopathy Neck: mild  JVD Endocrine:  No thryomegaly Vascular: No carotid bruits; pedal pulses 1+ bilaterally   Cardiac:  normal S1, S2; RRR; 2/6 systolic murmur no gallup or rub Lungs:  Scattered rales to auscultation bilaterally, no wheezing, rhonchi  Abd: soft, nontender, no hepatomegaly  Ext: 1-2+ edema though improved per his wife Musculoskeletal:  No deformities, BUE and BLE strength normal and equal Skin: very warm and dry  Neuro:  Alert and oriented X 3 MAE, follows commands, no focal abnormalities noted, foley in place. Psych:  Normal affect   Relevant CV Studies: IMPRESSIONS    1. Day 1 post TAVR with normal transaortic gradients with peak/mean 18/11  mmHg. Trivial paravalvular leak.  2. Left ventricular ejection fraction, by estimation, is 65 to 70%. The  left ventricle has normal function.  The left ventricle has no regional  wall motion abnormalities. Left ventricular diastolic function could not  be evaluated.  3. Right ventricular systolic function is normal. The right ventricular  size is normal. There is normal pulmonary artery systolic pressure.  4. The mitral valve is normal in structure. Trivial mitral valve  regurgitation. No evidence of mitral stenosis.  5. The aortic valve has been repaired/replaced. Aortic valve  regurgitation is not visualized. No aortic stenosis is present. There is a  26 mm Ultra, stented (TAVR) valve present in the aortic position.  Procedure Date: 09/06/2019. Echo findings are  consistent with normal structure and function of the aortic valve  prosthesis. Aortic valve mean gradient measures 11.0 mmHg.  6. The inferior vena cava is normal in size with greater than 50%  respiratory variability, suggesting right atrial pressure of 3 mmHg.   FINDINGS  Left Ventricle: Left ventricular ejection fraction, by estimation, is 65  to 70%. The left ventricle has normal function. The left ventricle has no  regional wall motion abnormalities. Definity contrast agent was given IV  to delineate the left ventricular  endocardial borders. The left ventricular internal cavity size was normal  in size. There is no left ventricular hypertrophy. Left ventricular  diastolic function could not be evaluated.   Right Ventricle: The right ventricular size is normal. No increase in  right ventricular wall thickness. Right ventricular systolic function is  normal. There is normal pulmonary artery systolic pressure.   Left Atrium: Left atrial size was normal in size.   Right Atrium: Right atrial size was normal in size.   Pericardium: Trivial pericardial effusion is present. There is no evidence  of cardiac tamponade.   Mitral Valve: The mitral valve is normal in structure. Normal mobility of  the mitral valve leaflets. Trivial mitral valve regurgitation.  No evidence  of mitral valve stenosis.   Tricuspid Valve: The tricuspid valve is normal in structure. Tricuspid  valve regurgitation is trivial. No evidence of tricuspid stenosis.   Aortic Valve: The aortic valve has been repaired/replaced. Aortic valve  regurgitation is not visualized. No aortic stenosis is present. Aortic  valve mean gradient measures 11.0 mmHg. Aortic valve peak gradient  measures 18.2 mmHg. Aortic valve area, by  VTI measures 3.75 cm. There is a 26 mm Ultra, stented (TAVR) valve  present in the aortic position. Procedure Date: 09/06/2019. Echo findings  are consistent with normal structure and function of the aortic valve  prosthesis.   Pulmonic Valve: The pulmonic valve was normal in structure. Pulmonic valve  regurgitation is not visualized. No evidence of pulmonic stenosis.   Aorta: The aortic root is normal in size and structure.   Venous: The  inferior vena cava is normal in size with greater than 50%  respiratory variability, suggesting right atrial pressure of 3 mmHg.   IAS/Shunts: No atrial level shunt detected by color flow Doppler.     LEFT VENTRICLE  PLAX 2D  LVOT diam:   2.35 cm  LV SV:     102  LV SV Index:  56  LVOT Area:   4.34 cm     AORTIC VALVE  AV Area (Vmax):  3.38 cm  AV Area (Vmean):  3.56 cm  AV Area (VTI):   3.75 cm  AV Vmax:      213.33 cm/s  AV Vmean:     139.000 cm/s  AV VTI:      0.272 m  AV Peak Grad:   18.2 mmHg  AV Mean Grad:   11.0 mmHg  LVOT Vmax:     166.00 cm/s  LVOT Vmean:    114.000 cm/s  LVOT VTI:     0.235 m  LVOT/AV VTI ratio: 0.87     SHUNTS  Systemic VTI: 0.24 m  Systemic Diam: 2.35 cm   Laboratory Data:  High Sensitivity Troponin:  No results for input(s): TROPONINIHS in the last 720 hours.   Chemistry Recent Labs  Lab 09/16/19 1146 09/16/19 1151  NA 133* 133*  K 4.2 4.0  CL 97* 94*  CO2 25  --   GLUCOSE 97 95  BUN 23 26*    CREATININE 1.72* 1.60*  CALCIUM 8.4*  --   GFRNONAA 34*  --   GFRAA 39*  --   ANIONGAP 11  --     Recent Labs  Lab 09/16/19 1146  PROT 4.9*  ALBUMIN 2.8*  AST 24  ALT 27  ALKPHOS 51  BILITOT 1.3*   Hematology Recent Labs  Lab 09/13/19 1102 09/16/19 1146 09/16/19 1151  WBC 19.7* 36.1*  --   RBC 4.27 4.12*  --   HGB 11.2* 10.6* 11.9*  HCT 34.4* 33.6* 35.0*  MCV 80.6 81.6  --   MCH 26.2 25.7*  --   MCHC 32.6 31.5  --   RDW 18.3* 18.3*  --   PLT 56* 62*  --    BNPNo results for input(s): BNP, PROBNP in the last 168 hours.  DDimer No results for input(s): DDIMER in the last 168 hours.   Radiology/Studies:  DG Chest Port 1 View  Result Date: 09/16/2019 CLINICAL DATA:  Postop fever. EXAM: PORTABLE CHEST 1 VIEW COMPARISON:  Radiograph 09/06/2019, CT 07/29/2019 FINDINGS: Post TAVR. Previous right internal jugular pacer is been removed. Battery pack projects over the left chest wall. Stable cardiomegaly. Aortic atherosclerosis. Central left lung mass is unchanged by radiograph. No evidence of acute airspace disease. No pleural fluid. No pneumothorax. No pulmonary edema. IMPRESSION: 1. No acute abnormality. 2. Stable mild cardiomegaly and left lung mass. Electronically Signed   By: Keith Rake M.D.   On: 09/16/2019 14:02   CT HEAD CODE STROKE WO CONTRAST  Result Date: 09/16/2019 CLINICAL DATA:  Code stroke. Possible stroke, neuro deficit, subacute. Right-sided facial droop, aphasia, last known well 10:00 AM EXAM: CT HEAD WITHOUT CONTRAST TECHNIQUE: Contiguous axial images were obtained from the base of the skull through the vertex without intravenous contrast. COMPARISON:  No pertinent prior studies available for comparison. FINDINGS: Brain: Mild generalized parenchymal atrophy. There is abnormal cortical/subcortical hyperdensity within the mid left frontal lobe and left frontal operculum consistent with acute/early subacute infarction. The infarct measures 2.4 x 3.7 x 3.5 cm.  There  is mild regional mass effect. No ventricular effacement or midline shift. No evidence of hemorrhagic conversion. Background mild chronic small vessel ischemic disease. No extra-axial fluid collection. No evidence of intracranial mass. No midline shift. Vascular: No definite hyperdense vessel. Atherosclerotic calcifications. Skull: Normal. Negative for fracture or focal lesion. Sinuses/Orbits: Visualized orbits show no acute finding. Bilateral lens replacements. Mild ethmoid and maxillary sinus mucosal thickening. No significant mastoid effusion. ASPECTS ([place] Stroke Program Early CT Score) - Ganglionic level infarction (caudate, lentiform nuclei, internal capsule, insula, M1-M3 cortex): 7 - Supraganglionic infarction (M4-M6 cortex): 1 Total score (0-10 with 10 being normal): 8 These results were called by telephone at the time of interpretation on 09/16/2019 at 12:06 pm to provider Dr. Rory Percy, who verbally acknowledged these results. IMPRESSION: 1. Acute/early subacute ischemic infarction within the left MCA vascular territory within the mid left frontal lobe and left frontal operculum. ASPECTS is 8. Mild regional mass effect. No midline shift. No hemorrhagic conversion. 2. Background mild generalized parenchymal atrophy and chronic small vessel ischemic disease 3. Mild ethmoid sinus mucosal thickening. Electronically Signed   By: Kellie Simmering DO   On: 09/16/2019 12:06        NO chest pain New York Heart Association (NYHA) Functional Class NYHA Class II  Assessment and Plan:   1. Stroke with hx of PAF and no anticoagulation with his thrombocytopenia and Hematology had stopped ASA for decreasing platelets.  Would need Dr. Benay Spice to weigh in on anticoagulation.  Currently in SR.  plts 62K  On steroids.  CHA2DS2VASc of 7 but risk of bleed is very high. 2. Thrombocytopenia and has been on steroids since April now on 30 mg daily.  3. UTI/Fever and elevated WBC to 36K up from 19K with no change in  dose of steroids.  Possible sepsis ID is now consulting. ABX added  4. Hypotension by EMS and rec'd IV fluid  5. Severe AS with TAVR 09/06/19 stable on last echo, echo has been ordered 6. Chronic diastolic HF lasix added on last visit on the 14th.  Now with some rales, may benefit form one dose of IV lasix, his edema has improved in lower ext.  Fluids are at 100 cc/hour  Has been on lasix 40 po daily 7. AKI with Cr 1.72 usually at 1.0 has rec'd fluids 8. CAD stable on last cath 07/26/19  9. Recurrent UTIs and urinary retention now back with foley.  ID to consult 10.  Left upper lobe nodule followed by Oncology 11. Hx leukemia, hairy cell 12. HLD on statin  13. HTN metopolol 12.5 BID would hold until BP stable.       For questions or updates, please contact Evan Please consult [email or web address] for contact info under    Signed, Cecilie Kicks, NP  09/16/2019 2:29 PM   I have personally seen and examined this patient. I agree with the assessment and plan as outlined above.  86 yo male with CAD, severe AS s/p TAVR on 09/06/19, RBBB, chronic thrombocytopenia and urinary retention who was discharged home last week after TAVR with a foley in place for urinary retention. He had the foley catheter removed two days ago in the urology office and has not voided well since then. His wife notes increasing LE edema over the past few days.  He went into the urology office today for follow up after his wife reported fevers at home. While in the urology office, he developed facial droop and aphasia and was brought to [facility] via  EMS. He was seen by Neurology and not felt to a candidate for tPA. Stroke like symptoms resolved. Head CT with evidence of acute/subacute infarct in the left frontal lobe. Of note, he was discharged on ASA alone following his TAVR given history of profound thrombocytopenia which is followed closely in hematology by Dr. Benay Spice. He was wearing a cardiac monitor post TAVR and had atrial  fibrillation noted. The valve team discussed this and elected to wait until we saw his total AF burden to consider whether or not anti-coagulation should be discussed with Hematology given his chronic issues.  Labs reviewed by me.  His u/a is very abnormal. It appears to that he has urosepsis. No residual stroke signs during my exam.  Telemetry: sinus My personal review of the EKG shows sinus rhythm, RBBB, LAFB My exam:  General: Well developed, well nourished, NAD  HEENT: OP clear, mucus membranes moist  SKIN: warm, dry. No rashes. Neuro: No focal deficits  Musculoskeletal: Muscle strength 5/5 all ext  Psychiatric: Mood and affect normal  Neck: No JVD Lungs:Clear bilaterally, no wheezes, rhonci, crackles Cardiovascular: Regular rate and rhythm. Soft systolic murmur.  Abdomen:Soft. Bowel sounds present. Non-tender.  Extremities: 1-2 + bilateral lower extremity pitting edema to the knees. Right groin soft with diffuse ecchymoses.   Plan:  Recent TAVR but his presentation today seems to be related to probable urosepsis due to issues with urinary retention, indwelling foley catheter. He has evidence of a CVA but not clear that this is contributing to his presentation. I doubt that his valve is infected. He does have evidence of volume overload on exam.  -it would be reasonable to repeat an echo but doubt this is valve endocarditis -urine cultures, blood cultires -broad spectrum antibiotics per ID team - Will start IV lasix.  -His groin is stable. Would be ok to resume ASA. I would involve Dr. Benay Spice before he consideration is given to starting long term anti-coagulation.   Lauree Chandler 09/16/2019 4:33 PM

## 2019-09-16 NOTE — Telephone Encounter (Signed)
Pt was returning nurse's phone call

## 2019-09-16 NOTE — Telephone Encounter (Signed)
Pts wife [name] called to report that the pt has developed a 102 fever since 6 am this morning. He has had chills, headache and says he does not feel well. She says his fever had improved with Tylenol and has come down to 98 but he is now sleeping and appears to be shivering.   She also reports that he has had the urge to urinate but he is not urinating. No blood in his urine noted. But he is c/o stomach pain. No nausea and vomiting.   He has took his Lasix yesterday but she also reports that his peripheral edema has increased and his swelling is up to his mid thigh.   I have advised her to continue to monitor and I will talk with/ forward to Bonney Leitz PA for her review.

## 2019-09-16 NOTE — ED Notes (Signed)
1150 98/80 1155 77/35  NS bolus started at 1157.

## 2019-09-16 NOTE — ED Notes (Signed)
Dr Miller at bedside. 

## 2019-09-16 NOTE — ED Triage Notes (Signed)
Pt at urology office, sneezed and was noted to have right facial droop, aphasia by ems. Pt recently had a TAVR  on ASA, chronic thrombocytopenia. Urinary retention.

## 2019-09-16 NOTE — ED Notes (Signed)
Notified admitting of decrease in bp

## 2019-09-16 NOTE — Progress Notes (Signed)
Pt has ZIO monitor that was placed last week. Monitor is unsafe and we are unable to proceed with scan. RN notified.

## 2019-09-16 NOTE — Telephone Encounter (Signed)
I spoke with pt's wife. He has not been able to urinate since cath removed. I suspect he has acute urinary retention with a full bladder with potential UTI which could cause fevers. He will need to be seen by [facility] for a bladder scan and potential replacement of catheter. Also needs UA/culture and likely to be started on Abx. Once he is able to void, the lasix will help his swelling.

## 2019-09-16 NOTE — ED Provider Notes (Signed)
[facility] EMERGENCY DEPARTMENT Provider Note   CSN: [id number] Arrival date & time: 09/16/19  1141  An emergency department physician performed an initial assessment on this suspected stroke patient at 1142.  History Chief Complaint  Patient presents with  . Code Stroke    [name] is a 86 y.o. male.  HPI   86 y/o male - from Urology office - had been seen for Urinary retention - evidently had a sneeze, followed by syncope / unresponsiveness, then had slurred speech / aphasia and R sided facial droop - which was present on the EMS arrival - seems to have resolved by arrival - denies CP  / SOB / HA and is feeling well otherwise.  No c/o at this time - I saw the patient on arrival at the ED at the bridge  / nursing station - Dr. Rory Percy on scene and accompanying patient ot the CT scanner.    Additional history obtained - pt has been having some fevers, has had some urinary catheter problems, and hx of valve replacement.  Additional history was obtained from the wife, she states that he had a valve replacement done transvascular, July, approximately 8 or 9 days ago.  He had some difficulty with urinary retention at that time.  There has been some difficulty speaking for the last couple of days but not intense, there has been some fevers as high as 101.  He does have a history of thrombocytopenia and has not been on anticoagulants other than a baby aspirin immediately after surgery.  Past Medical History:  Diagnosis Date  . Acute appendicitis   . Anemia   . Anxiety   . Arthritis    "back" (03/14/2014)  . Blood dyscrasia    hairy cell leukemia  . CAD (coronary artery disease)    a. 03/14/14  s/p overlapping DES x2 to mid-distal RCA.  . 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 Colon cancer ([place]) 1984  . Compression fracture of lumbar spine, non-traumatic ([place])   . DJD (degenerative joint disease) of lumbar spine   . Dyslipidemia   . Dyspnea     . Elevated PSA   . GERD (gastroesophageal reflux disease)   . Glaucoma   . Hairy cell leukemia ([place]) dx'd 1980  . Heart murmur   . History of blood transfusion    "several; related to hairy cell leukemia & tx "  . History of stomach ulcers 1968  . Hypertension   . Leukemia, hairy cell ([facility])   . Malnutrition ([place])   . Osteoarthritis   . Osteoporosis   . Pneumonia   . 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 Angelena Form and Cyndia Bent.   . Severe aortic stenosis    s/p tavr  . Skin cancer of face     Patient Active Problem List   Diagnosis Date Noted  . Acute on chronic diastolic heart failure ([place]) 09/06/2019  . Bifascicular block 09/06/2019  . 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 Severe aortic stenosis   . Chronic ITP (idiopathic thrombocytopenia) ([facility]) 07/04/2019  . Chronic eczematous otitis externa of both ears 01/15/2016  . Sensorineural hearing loss (SNHL) of both ears 01/15/2016  . Prostate nodule 03/09/2015  . Constipation 06/22/2014  . Malaise and fatigue 06/22/2014  . CAD (coronary artery disease)   . GERD (gastroesophageal reflux disease)   . Hypertension   .  Dyslipidemia   . Colon cancer ([place])   . Solitary pulmonary nodule 02/11/2013  . 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 Glaucoma 11/27/2011  . Personal history of other malignant neoplasm of skin 10/14/2011  . Elevated PSA 09/12/2011  . 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 Traction detachment of retina 11/18/2010  . Exudative age-related macular degeneration ([place]) 04/16/2010  . Epiretinal membrane 04/16/2010  . Vitreous degeneration 04/16/2010  . Staphylococcus aureus infection 11/30/2006    Past Surgical History:  Procedure Laterality Date  . APPENDECTOMY  10/2007  . CATARACT EXTRACTION, BILATERAL Bilateral 02/2008  . COLON  SURGERY  1984   "sigmoid; open"  . CORONARY ANGIOPLASTY WITH STENT PLACEMENT  03/14/2014   "2"  . EYE SURGERY Bilateral    cataract  . FRACTURE SURGERY    . LEFT HEART CATHETERIZATION WITH CORONARY ANGIOGRAM N/A 03/14/2014   Procedure: LEFT HEART CATHETERIZATION WITH CORONARY ANGIOGRAM;  Surgeon: Peter M Martinique, MD;  Location: [facility] CATH LAB;  Service: Cardiovascular;  Laterality: N/A;  . LIPOMA EXCISION Right 07/2011   liposarcoma resection; "back"  . MOHS SURGERY Left 02/2011  . MOHS SURGERY  X 3   "all on my face"  . MOLE REMOVAL Left 1985   cheek  . ORIF ANKLE FRACTURE Left 2000  . RIGHT/LEFT HEART CATH AND CORONARY ANGIOGRAPHY N/A 07/26/2019   Procedure: RIGHT/LEFT HEART CATH AND CORONARY ANGIOGRAPHY;  Surgeon: Belva Crome, MD;  Location: [facility] CV LAB;  Service: Cardiovascular;  Laterality: N/A;  . SHOULDER SURGERY  04/2004  . SPLENECTOMY  1992  . TEE WITHOUT CARDIOVERSION N/A 09/06/2019   Procedure: TRANSESOPHAGEAL ECHOCARDIOGRAM (TEE);  Surgeon: Burnell Blanks, MD;  Location: [facility] CV LAB;  Service: Open Heart Surgery;  Laterality: N/A;  . TONSILLECTOMY AND ADENOIDECTOMY  1939  . TRANSCATHETER AORTIC VALVE REPLACEMENT, TRANSFEMORAL N/A 09/06/2019   Procedure: TRANSCATHETER AORTIC VALVE REPLACEMENT, TRANSFEMORAL;  Surgeon: Burnell Blanks, MD;  Location: [facility] CV LAB;  Service: Open Heart Surgery;  Laterality: N/A;       Family History  Problem Relation Age of Onset  . Congestive Heart Failure Father 73  . Stroke Mother   . Diabetes Mellitus II Brother   . Prostate cancer Brother   . Heart Problems Brother        CABG    Social History   Tobacco Use  . Smoking status: Former Smoker    Packs/day: 0.25    Years: 1.00    Pack years: 0.25    Types: Cigarettes, Cigars  . Smokeless tobacco: Never Used  . Tobacco comment: occasional social smoker during college.  Vaping Use  . Vaping Use: Never used  Substance Use Topics  . Alcohol use:  Yes    Alcohol/week: 9.0 standard drinks    Types: 2 Glasses of wine, 2 Shots of liquor, 5 Standard drinks or equivalent per week    Comment: socially  . Drug use: No    Home Medications Prior to Admission medications   Medication Sig Start Date End Date Taking? Authorizing Provider  amoxicillin (AMOXIL) 500 MG tablet Take 4 capsules (2,000 mg) one hour prior to all dental visits. 09/14/19   Eileen Stanford, PA-C  atorvastatin (LIPITOR) 20 MG tablet TAKE 1 TABLET BY MOUTH DAILY AT 6 PM Patient taking differently: Take 20 mg by mouth daily at 6 PM.  02/01/19   Belva Crome, MD  Cholecalciferol (VITAMIN D3) 50 MCG (2000 UT) TABS Take  2,000 Units by mouth every evening.    [provider]  desonide (DESOWEN) 0.05 % cream Apply 1 application topically 2 (two) times daily as needed (rash/irritation.).  01/28/16   [provider]  fluocinonide (LIDEX) 0.05 % external solution Apply 1 application topically 2 (two) times daily as needed (apply to ears).  05/31/15   [provider]  furosemide (LASIX) 40 MG tablet Take 1 tablet (40 mg total) by mouth daily. 09/14/19 09/08/20  Eileen Stanford, PA-C  latanoprost (XALATAN) 0.005 % ophthalmic solution Place 1 drop into both eyes at bedtime.     [provider]  metoprolol tartrate (LOPRESSOR) 25 MG tablet Take 0.5 tablets (12.5 mg total) by mouth daily. 09/08/19   Eileen Stanford, PA-C  Multiple Vitamins-Minerals (PRESERVISION AREDS 2 PO) Take 1 capsule by mouth 2 (two) times daily.     [provider]  nitroGLYCERIN (NITROSTAT) 0.4 MG SL tablet Place 1 tablet (0.4 mg total) under the tongue every 5 (five) minutes as needed for chest pain. Please make yearly appt with Dr. Tamala Julian for September. 1st attempt 08/12/18   Belva Crome, MD  pantoprazole (PROTONIX) 40 MG tablet TAKE 1 TABLET BY MOUTH DAILY GENERIC EQUIVALENT FOR PROTONIX 08/11/19   Belva Crome, MD  potassium chloride (KLOR-CON) 10 MEQ tablet Take  1 tablet (10 mEq total) by mouth daily. 09/14/19 09/08/20  Eileen Stanford, PA-C  predniSONE (DELTASONE) 10 MG tablet TAKE 4 TABLETS(40 MG) BY MOUTH DAILY WITH BREAKFAST 08/23/19   Ladell Pier, MD  ranibizumab (LUCENTIS) 0.5 MG/0.05ML SOLN 0.5 mg by Intravitreal route every 28 (twenty-eight) days. Left Eye ONLY 10/11/13   [provider]  sertraline (ZOLOFT) 50 MG tablet Take 50 mg by mouth every evening.     [provider]  tamsulosin (FLOMAX) 0.4 MG CAPS capsule Take 0.8 mg by mouth daily after supper.     [provider]  timolol (BETIMOL) 0.5 % ophthalmic solution Place 1 drop into the right eye daily.     [provider]  triamcinolone cream (KENALOG) 0.1 % Apply 1 application topically 2 (two) times daily as needed (itching).     [provider]    Allergies    Antazoline; Antihistamines, chlorpheniramine-type; and Sulfa antibiotics  Review of Systems   Review of Systems  All other systems reviewed and are negative.   Physical Exam Updated Vital Signs BP 121/60   Pulse 76   Temp 99.2 F (37.3 C)   Resp 16   Ht 1.626 m (5\' 4" )   Wt 79.5 kg   SpO2 100%   BMI 30.08 kg/m   Physical Exam Vitals and nursing note reviewed.  Constitutional:      General: He is not in acute distress.    Appearance: He is well-developed.  HENT:     Head: Normocephalic and atraumatic.     Mouth/Throat:     Pharynx: No oropharyngeal exudate.  Eyes:     General: No scleral icterus.       Right eye: No discharge.        Left eye: No discharge.     Conjunctiva/sclera: Conjunctivae normal.     Pupils: Pupils are equal, round, and reactive to light.  Neck:     Thyroid: No thyromegaly.     Vascular: No JVD.  Cardiovascular:     Rate and Rhythm: Normal rate and regular rhythm.     Heart sounds: Normal heart sounds. No murmur heard.  No  friction rub. No gallop.   Pulmonary:     Effort: Pulmonary effort is normal. No respiratory distress.      Breath sounds: Normal breath sounds. No wheezing or rales.  Abdominal:     General: Bowel sounds are normal. There is no distension.     Palpations: Abdomen is soft. There is no mass.     Tenderness: There is abdominal tenderness.     Comments: Mild tenderness in the suprapubic region  Genitourinary:    Comments: Extensive ecchymosis in the bilateral inguinal regions, normal-appearing penis scrotum and testicles Musculoskeletal:        General: No tenderness. Normal range of motion.     Cervical back: Normal range of motion and neck supple.     Right lower leg: Edema present.     Left lower leg: Edema present.  Lymphadenopathy:     Cervical: No cervical adenopathy.  Skin:    General: Skin is warm and dry.     Findings: No erythema or rash.  Neurological:     Mental Status: He is alert.     Coordination: Coordination normal.     Comments: Able to follow commands - normal speech, no facial droop - no asymetry, normal FNF, normal strength in all 4 extremities, normal memory.  Psychiatric:        Behavior: Behavior normal.     ED Results / Procedures / Treatments   Labs (all labs ordered are listed, but only abnormal results are displayed) Labs Reviewed  CBC - Abnormal; Notable for the following components:      Result Value   WBC 36.1 (*)    RBC 4.12 (*)    Hemoglobin 10.6 (*)    HCT 33.6 (*)    MCH 25.7 (*)    RDW 18.3 (*)    Platelets 62 (*)    nRBC 0.3 (*)    All other components within normal limits  DIFFERENTIAL - Abnormal; Notable for the following components:   Neutro Abs 28.3 (*)    Lymphs Abs 0.5 (*)    Monocytes Absolute 4.7 (*)    Basophils Absolute 0.2 (*)    Abs Immature Granulocytes 2.46 (*)    All other components within normal limits  COMPREHENSIVE METABOLIC PANEL - Abnormal; Notable for the following components:   Sodium 133 (*)    Chloride 97 (*)    Creatinine, Ser 1.72 (*)    Calcium 8.4 (*)    Total Protein 4.9 (*)    Albumin 2.8 (*)    Total  Bilirubin 1.3 (*)    GFR calc non Af Amer 34 (*)    GFR calc Af Amer 39 (*)    All other components within normal limits  URINALYSIS, ROUTINE W REFLEX MICROSCOPIC - Abnormal; Notable for the following components:   APPearance CLOUDY (*)    Hgb urine dipstick MODERATE (*)    Leukocytes,Ua MODERATE (*)    WBC, UA >50 (*)    Bacteria, UA MANY (*)    All other components within normal limits  I-STAT CHEM 8, ED - Abnormal; Notable for the following components:   Sodium 133 (*)    Chloride 94 (*)    BUN 26 (*)    Creatinine, Ser 1.60 (*)    Calcium, Ion 1.09 (*)    Hemoglobin 11.9 (*)    HCT 35.0 (*)    All other components within normal limits  CULTURE, BLOOD (ROUTINE X 2)  CULTURE, BLOOD (ROUTINE X 2)  URINE  CULTURE  SARS CORONAVIRUS 2 BY RT PCR (HOSPITAL ORDER, [facility])  PROTIME-INR  APTT  LACTIC ACID, PLASMA  LACTIC ACID, PLASMA  CBG MONITORING, ED    EKG None  Radiology CT HEAD CODE STROKE WO CONTRAST  Result Date: 09/16/2019 CLINICAL DATA:  Code stroke. Possible stroke, neuro deficit, subacute. Right-sided facial droop, aphasia, last known well 10:00 AM EXAM: CT HEAD WITHOUT CONTRAST TECHNIQUE: Contiguous axial images were obtained from the base of the skull through the vertex without intravenous contrast. COMPARISON:  No pertinent prior studies available for comparison. FINDINGS: Brain: Mild generalized parenchymal atrophy. There is abnormal cortical/subcortical hyperdensity within the mid left frontal lobe and left frontal operculum consistent with acute/early subacute infarction. The infarct measures 2.4 x 3.7 x 3.5 cm. There is mild regional mass effect. No ventricular effacement or midline shift. No evidence of hemorrhagic conversion. Background mild chronic small vessel ischemic disease. No extra-axial fluid collection. No evidence of intracranial mass. No midline shift. Vascular: No definite hyperdense vessel. Atherosclerotic calcifications.  Skull: Normal. Negative for fracture or focal lesion. Sinuses/Orbits: Visualized orbits show no acute finding. Bilateral lens replacements. Mild ethmoid and maxillary sinus mucosal thickening. No significant mastoid effusion. ASPECTS ([place] Stroke Program Early CT Score) - Ganglionic level infarction (caudate, lentiform nuclei, internal capsule, insula, M1-M3 cortex): 7 - Supraganglionic infarction (M4-M6 cortex): 1 Total score (0-10 with 10 being normal): 8 These results were called by telephone at the time of interpretation on 09/16/2019 at 12:06 pm to provider Dr. Rory Percy, who verbally acknowledged these results. IMPRESSION: 1. Acute/early subacute ischemic infarction within the left MCA vascular territory within the mid left frontal lobe and left frontal operculum. ASPECTS is 8. Mild regional mass effect. No midline shift. No hemorrhagic conversion. 2. Background mild generalized parenchymal atrophy and chronic small vessel ischemic disease 3. Mild ethmoid sinus mucosal thickening. Electronically Signed   By: Kellie Simmering DO   On: 09/16/2019 12:06    Procedures .Critical Care Performed by: Noemi Chapel, MD Authorized by: Noemi Chapel, MD   Critical care provider statement:    Critical care time (minutes):  35   Critical care time was exclusive of:  Separately billable procedures and treating other patients and teaching time   Critical care was necessary to treat or prevent imminent or life-threatening deterioration of the following conditions:  Sepsis   Critical care was time spent personally by me on the following activities:  Blood draw for specimens, development of treatment plan with patient or surrogate, discussions with consultants, evaluation of patient's response to treatment, examination of patient, obtaining history from patient or surrogate, ordering and performing treatments and interventions, ordering and review of laboratory studies, ordering and review of radiographic studies, pulse  oximetry, re-evaluation of patient's condition and review of old charts Comments:         (including critical care time)  Medications Ordered in ED Medications  sodium chloride flush (NS) 0.9 % injection 3 mL (has no administration in time range)  cefTRIAXone (ROCEPHIN) 1 g in sodium chloride 0.9 % 100 mL IVPB (has no administration in time range)  sodium chloride 0.9 % bolus 1,000 mL (0 mLs Intravenous Stopped 09/16/19 1314)    ED Course  I have reviewed the triage vital signs and the nursing notes.  Pertinent labs & imaging results that were available during my care of the patient were reviewed by me and considered in my medical decision making (see chart for details).    MDM Rules/Calculators/A&P  Discussion with neurology, the patient will need further work-up for possible infection, sepsis but also admission for stroke, evidence on CT scan shows that there is possible strokelike area though it is not as clear and he will need an MRI.  Possible findings of L MCA territory CVA - and with sx this fits.  Not in the window.  Has been having speech difriculty for a couple of days reportedly.  Would also consider sepsis, infection, postoperative complications, he could have had a postoperative blood clot and a subsequent stroke from that given the valve replacement.  Discussed with neurology who recommends the patient finished the stroke work-up given the CT scan which is abnormal as well as being admitted for possible infection.  His blood pressure is low at 110/66, he feels hot, will place a temp Foley given his urinary retention  Further review of the medical record shows that the patient has a hairy cell leukemia, has a chronic elevation in white blood cell count and chronic thrombocytopenia.  EKG performed September 16, 2019 at 12:09 PM shows normal sinus rhythm, left axis deviation, right bundle branch block, no ST elevation or depression  Discussed the patient  with the hospitalist, he will come see the patient for admission.  This patient has multiple things going on including a urinary tract infection as well as new lesions on the brain, his leukocytosis is chronic and I do not think this is new so with fever and white count this does not necessarily represent sepsis however cultures have been ordered.  Would consider this patient be critically ill until these things are treated and the patient stabilizes  Final Clinical Impression(s) / ED Diagnoses Final diagnoses:  Urinary tract infection without hematuria, site unspecified  Cerebrovascular accident (CVA), unspecified mechanism ([place])      Noemi Chapel, MD 09/16/19 1345

## 2019-09-17 ENCOUNTER — Encounter (HOSPITAL_COMMUNITY): Payer: Medicare Other

## 2019-09-17 ENCOUNTER — Inpatient Hospital Stay (HOSPITAL_COMMUNITY): Payer: Medicare Other

## 2019-09-17 DIAGNOSIS — R6520 Severe sepsis without septic shock: Secondary | ICD-10-CM

## 2019-09-17 DIAGNOSIS — A498 Other bacterial infections of unspecified site: Secondary | ICD-10-CM | POA: Diagnosis present

## 2019-09-17 DIAGNOSIS — A419 Sepsis, unspecified organism: Secondary | ICD-10-CM

## 2019-09-17 DIAGNOSIS — G9340 Encephalopathy, unspecified: Secondary | ICD-10-CM

## 2019-09-17 DIAGNOSIS — I480 Paroxysmal atrial fibrillation: Secondary | ICD-10-CM | POA: Diagnosis present

## 2019-09-17 DIAGNOSIS — I63512 Cerebral infarction due to unspecified occlusion or stenosis of left middle cerebral artery: Secondary | ICD-10-CM

## 2019-09-17 DIAGNOSIS — I361 Nonrheumatic tricuspid (valve) insufficiency: Secondary | ICD-10-CM

## 2019-09-17 DIAGNOSIS — N390 Urinary tract infection, site not specified: Secondary | ICD-10-CM

## 2019-09-17 DIAGNOSIS — B965 Pseudomonas (aeruginosa) (mallei) (pseudomallei) as the cause of diseases classified elsewhere: Secondary | ICD-10-CM

## 2019-09-17 DIAGNOSIS — Z952 Presence of prosthetic heart valve: Secondary | ICD-10-CM | POA: Diagnosis not present

## 2019-09-17 LAB — CBC WITH DIFFERENTIAL/PLATELET
Abs Immature Granulocytes: 1.25 K/uL — ABNORMAL HIGH (ref 0.00–0.07)
Basophils Absolute: 0.1 K/uL (ref 0.0–0.1)
Basophils Relative: 0 %
Eosinophils Absolute: 0.1 K/uL (ref 0.0–0.5)
Eosinophils Relative: 0 %
HCT: 32.7 % — ABNORMAL LOW (ref 39.0–52.0)
Hemoglobin: 10.4 g/dL — ABNORMAL LOW (ref 13.0–17.0)
Immature Granulocytes: 5 %
Lymphocytes Relative: 2 %
Lymphs Abs: 0.6 K/uL — ABNORMAL LOW (ref 0.7–4.0)
MCH: 25.2 pg — ABNORMAL LOW (ref 26.0–34.0)
MCHC: 31.8 g/dL (ref 30.0–36.0)
MCV: 79.4 fL — ABNORMAL LOW (ref 80.0–100.0)
Monocytes Absolute: 2.9 K/uL — ABNORMAL HIGH (ref 0.1–1.0)
Monocytes Relative: 11 %
Neutro Abs: 21.3 K/uL — ABNORMAL HIGH (ref 1.7–7.7)
Neutrophils Relative %: 82 %
Platelets: 73 K/uL — ABNORMAL LOW (ref 150–400)
RBC: 4.12 MIL/uL — ABNORMAL LOW (ref 4.22–5.81)
RDW: 18.6 % — ABNORMAL HIGH (ref 11.5–15.5)
WBC: 26.2 K/uL — ABNORMAL HIGH (ref 4.0–10.5)
nRBC: 0.2 % (ref 0.0–0.2)

## 2019-09-17 LAB — BASIC METABOLIC PANEL
Anion gap: 13 (ref 5–15)
BUN: 20 mg/dL (ref 8–23)
CO2: 21 mmol/L — ABNORMAL LOW (ref 22–32)
Calcium: 7.8 mg/dL — ABNORMAL LOW (ref 8.9–10.3)
Chloride: 100 mmol/L (ref 98–111)
Creatinine, Ser: 1.51 mg/dL — ABNORMAL HIGH (ref 0.61–1.24)
GFR calc Af Amer: 45 mL/min — ABNORMAL LOW (ref >60)
GFR calc non Af Amer: 39 mL/min — ABNORMAL LOW (ref >60)
Glucose, Bld: 91 mg/dL (ref 70–99)
Potassium: 3.6 mmol/L (ref 3.5–5.1)
Sodium: 134 mmol/L — ABNORMAL LOW (ref 135–145)

## 2019-09-17 LAB — ECHOCARDIOGRAM COMPLETE
AR max vel: 1.86 cm2
AV Area VTI: 1.88 cm2
AV Area mean vel: 1.76 cm2
AV Mean grad: 25.2 mmHg
AV Peak grad: 41.5 mmHg
Ao pk vel: 3.22 m/s
Area-P 1/2: 2.23 cm2
Calc EF: 57 %
Height: 64 in
S' Lateral: 2.9 cm
Single Plane A2C EF: 47.7 %
Single Plane A4C EF: 61.9 %
Weight: 2804.25 [oz_av]

## 2019-09-17 LAB — BLOOD CULTURE ID PANEL (REFLEXED)

## 2019-09-17 LAB — LIPID PANEL
Cholesterol: 96 mg/dL (ref 0–200)
HDL: 30 mg/dL — ABNORMAL LOW (ref >40)
LDL Cholesterol: 43 mg/dL (ref 0–99)
Total CHOL/HDL Ratio: 3.2 RATIO
Triglycerides: 115 mg/dL (ref <150)
VLDL: 23 mg/dL (ref 0–40)

## 2019-09-17 LAB — HEMOGLOBIN A1C
Hgb A1c MFr Bld: 6.6 % — ABNORMAL HIGH (ref 4.8–5.6)
Mean Plasma Glucose: 142.72 mg/dL

## 2019-09-17 MED ORDER — CHLORHEXIDINE GLUCONATE CLOTH 2 % EX PADS
6.0000 | MEDICATED_PAD | Freq: Every day | CUTANEOUS | Status: DC
  Administered 2019-09-18 – 2019-09-21 (×4): 6 via TOPICAL

## 2019-09-17 MED ORDER — SODIUM CHLORIDE 0.9 % IV SOLN
2.0000 g | INTRAVENOUS | Status: DC
  Administered 2019-09-17: 2 g via INTRAVENOUS
  Filled 2019-09-17: qty 2

## 2019-09-17 MED ORDER — PERFLUTREN LIPID MICROSPHERE
1.0000 mL | INTRAVENOUS | Status: DC | PRN
  Administered 2019-09-17: 2 mL via INTRAVENOUS
  Filled 2019-09-17: qty 10

## 2019-09-17 MED ORDER — ASPIRIN EC 81 MG PO TBEC
81.0000 mg | DELAYED_RELEASE_TABLET | Freq: Every day | ORAL | Status: DC
  Filled 2019-09-17: qty 1

## 2019-09-17 MED ORDER — CLOPIDOGREL BISULFATE 75 MG PO TABS
75.0000 mg | ORAL_TABLET | Freq: Every day | ORAL | Status: DC
  Administered 2019-09-17 – 2019-09-18 (×2): 75 mg via ORAL
  Filled 2019-09-17 (×2): qty 1

## 2019-09-17 NOTE — Progress Notes (Signed)
  Echocardiogram 2D Echocardiogram has been performed.  [name] 09/17/2019, 1:27 PM

## 2019-09-17 NOTE — Progress Notes (Signed)
   09/17/19 0000  Assess: MEWS Score  ECG Heart Rate 85  Resp (!) 26  Assess: MEWS Score  MEWS Temp 0  MEWS Systolic 0  MEWS Pulse 0  MEWS RR 2  MEWS LOC 0  MEWS Score 2  MEWS Score Color Yellow  Assess: if the MEWS score is Yellow or Red  Were vital signs taken at a resting state? Yes  Early Detection of Sepsis Score *See Row Information* Low  MEWS guidelines implemented *See Row Information* No, vital signs rechecked (occasionally tachypneic; will recheck RR)

## 2019-09-17 NOTE — Evaluation (Signed)
Physical Therapy Evaluation Patient Details Name: [name] MRN: [id number] DOB: Nov 09, 1923 Today's Date: 09/17/2019   History of Present Illness  [name] is a 86 yo male presenting with AMS. Upon work-up, imaging revealed large, left-sided MCA acute/early subacute infarct and UA indicates UTI. PMH includes: AS s/p TAVR on 09/06/19, CAD, HTN, chronic thrombocytopenia, hairy cell lymphoma s/p splenectomy, and chronic leukocytosis.  Clinical Impression  Pt in bed upon arrival of PT, agreeable to evaluation at this time. Prior to admission the pt was independent with all mobility and ADLs, living in a townhome with 2-3 steps to enter with his wife. The pt now presents with limitations in functional mobility, strength, stability, and activity tolerance due to above dx, and will continue to benefit from skilled PT to address these deficits. The pt was able to demo good capacity for transfers and short ambulation today, but demonstrated decreased dynamic stability in standing, requiring minA through HHA to ambulate, and reaching for other furniture as possible to stabilize. The pt will continue to benefit from skilled PT acutely to further progress functional strength and stability for transfers and to facilitate return to independent ambulation and stair navigation.      Follow Up Recommendations Home health PT;Supervision for mobility/OOB    Equipment Recommendations   (pt has RW and cane at home)    Recommendations for Other Services       Precautions / Restrictions Precautions Precautions: Fall Precaution Comments: is s/p TAVR 7/6 wotj cardac monitor still in place Restrictions Weight Bearing Restrictions: No      Mobility  Bed Mobility Overal bed mobility: Needs Assistance Bed Mobility: Supine to Sit     Supine to sit: Supervision     General bed mobility comments: supervision for line managment, mina to steady initially, but progressed to supervision. Pt able to scoot at  EOB without assist  Transfers Overall transfer level: Needs assistance Equipment used: None Transfers: Sit to/from Stand Sit to Stand: Min assist         General transfer comment: minA of 1 to stand, pt able to static stand without assist or UE support  Ambulation/Gait Ambulation/Gait assistance: Min assist Gait Distance (Feet): 25 Feet Assistive device: 1 person hand held assist Gait Pattern/deviations: Step-to pattern;Decreased stride length;Shuffle Gait velocity: decreased   General Gait Details: pt with short shuflling steps, likely due to small/crowded walking area. Pt wife reports he is always a slow mover. no LOB, but pt requires single UE support to maintain      Balance Overall balance assessment: Needs assistance Sitting-balance support: No upper extremity supported;Feet supported Sitting balance-Leahy Scale: Good     Standing balance support: Single extremity supported;During functional activity Standing balance-Leahy Scale: Fair Standing balance comment: able to static stand without support, minA through HHA to ambulate                             Pertinent Vitals/Pain Pain Assessment: No/denies pain    Home Living Family/patient expects to be discharged to:: Private residence Living Arrangements: Spouse/significant other Available Help at Discharge: Family Type of Home: Other(Comment) (townhouse) Home Access: Stairs to enter Entrance Stairs-Rails: Right;Left;Can reach both Entrance Stairs-Number of Steps: 2 Home Layout: One level Home Equipment: Hand held shower head;Shower seat - built in;Walker - 2 wheels;Cane - single point Additional Comments: newly rennovated bathroom, no grab bars yet    Prior Function Level of Independence: Independent  Hand Dominance        Extremity/Trunk Assessment   Upper Extremity Assessment Upper Extremity Assessment: Defer to OT evaluation    Lower Extremity Assessment Lower  Extremity Assessment: Overall WFL for tasks assessed (grossly functional)    Cervical / Trunk Assessment Cervical / Trunk Assessment: Kyphotic  Communication   Communication: HOH (has R hearing aide in, still HOH)  Cognition Arousal/Alertness: Awake/alert Behavior During Therapy: WFL for tasks assessed/performed Overall Cognitive Status: Within Functional Limits for tasks assessed                                 General Comments: cognition appears funtional when the pt is able to hear questions/commands      General Comments General comments (skin integrity, edema, etc.): Pt wife present and supportive. VSS throughout. Pt has a small grey box that is part of heart monitor from TAVR that must stay within 10 ft of him at all times    Exercises     Assessment/Plan    PT Assessment Patient needs continued PT services  PT Problem List Decreased strength;Decreased mobility;Decreased activity tolerance;Decreased balance;Decreased knowledge of use of DME       PT Treatment Interventions DME instruction;Therapeutic exercise;Gait training;Stair training;Functional mobility training;Therapeutic activities;Patient/family education;Cognitive remediation;Balance training    PT Goals (Current goals can be found in the Care Plan section)  Acute Rehab PT Goals Patient Stated Goal: return home PT Goal Formulation: With patient/family Time For Goal Achievement: 10/01/19 Potential to Achieve Goals: Good    Frequency Min 3X/week   Barriers to discharge        Co-evaluation PT/OT/SLP Co-Evaluation/Treatment: Yes Reason for Co-Treatment: Necessary to address cognition/behavior during functional activity;For patient/therapist safety PT goals addressed during session: Mobility/safety with mobility;Balance;Strengthening/ROM         AM-PAC PT "6 Clicks" Mobility  Outcome Measure Help needed turning from your back to your side while in a flat bed without using bedrails?: A  Little Help needed moving from lying on your back to sitting on the side of a flat bed without using bedrails?: A Little Help needed moving to and from a bed to a chair (including a wheelchair)?: A Little Help needed standing up from a chair using your arms (e.g., wheelchair or bedside chair)?: A Little Help needed to walk in hospital room?: A Little Help needed climbing 3-5 steps with a railing? : A Lot 6 Click Score: 17    End of Session Equipment Utilized During Treatment: Gait belt Activity Tolerance: Patient tolerated treatment well Patient left: in chair;with chair alarm set;with family/visitor present;with call bell/phone within reach Nurse Communication: Mobility status PT Visit Diagnosis: Difficulty in walking, not elsewhere classified (R26.2);Muscle weakness (generalized) (M62.81)    Time: 3383-2919 PT Time Calculation (min) (ACUTE ONLY): 32 min   Charges:   PT Evaluation $PT Eval Moderate Complexity: 1 Mod          Karma Ganja, PT, DPT   Acute Rehabilitation Department Pager #: [phone]   Otho Bellows 09/17/2019, 10:20 AM

## 2019-09-17 NOTE — Progress Notes (Signed)
Progress Note  Patient Name: [name] Date of Encounter: 09/17/2019  [facility] Cardiologist: Sinclair Grooms, MD   Subjective   Feels sleepy. Tired. Temp up to 103 last night. No dyspnea. No chest pain  Inpatient Medications    Scheduled Meds: . atorvastatin  20 mg Oral q1800  . cholecalciferol  2,000 Units Oral QPM  . hydrocortisone cream   Topical BID  . latanoprost  1 drop Both Eyes QHS  . pantoprazole  40 mg Oral Daily  . predniSONE  40 mg Oral Q breakfast  . sertraline  50 mg Oral QPM  . tamsulosin  0.8 mg Oral QPC supper  . timolol  1 drop Right Eye Daily   Continuous Infusions: . sodium chloride 100 mL/hr (09/16/19 1625)  . cefTRIAXone (ROCEPHIN)  IV     PRN Meds: acetaminophen **OR** acetaminophen (TYLENOL) oral liquid 160 mg/5 mL **OR** acetaminophen, fluocinonide, senna-docusate, triamcinolone cream   Vital Signs    Vitals:   09/17/19 0528 09/17/19 0600 09/17/19 0645 09/17/19 0727  BP:    (!) 107/56  Pulse:    (!) 46  Resp: (!) 23 (!) 23 19 18   Temp: (!) 101.4 F (38.6 C)  99.2 F (37.3 C) 98.6 F (37 C)  TempSrc: Oral   Oral  SpO2:    93%  Weight:      Height:        Intake/Output Summary (Last 24 hours) at 09/17/2019 0816 Last data filed at 09/16/2019 1721 Gross per 24 hour  Intake 1272.02 ml  Output 300 ml  Net 972.02 ml   Last 3 Weights 09/16/2019 09/14/2019 09/13/2019  Weight (lbs) 175 lb 4.3 oz 173 lb 12.8 oz 173 lb 8 oz  Weight (kg) 79.5 kg 78.835 kg 78.699 kg      Telemetry    NSR with occ PVC/couplet. No Afib  - Personally Reviewed  ECG    NSR with LAFB, RBBB - Personally Reviewed  Physical Exam   GEN: Elderly WM No acute distress.   Neck: No JVD Cardiac: RRR, soft SEM RUSB, no rub or gallop Respiratory: Clear to auscultation bilaterally. GI: Soft, nontender, non-distended  MS: 1+ edema; No deformity. Neuro:  Nonfocal  Psych: Normal affect   Labs    High Sensitivity Troponin:  No results for input(s):  TROPONINIHS in the last 720 hours.    Chemistry Recent Labs  Lab 09/16/19 1146 09/16/19 1151  NA 133* 133*  K 4.2 4.0  CL 97* 94*  CO2 25  --   GLUCOSE 97 95  BUN 23 26*  CREATININE 1.72* 1.60*  CALCIUM 8.4*  --   PROT 4.9*  --   ALBUMIN 2.8*  --   AST 24  --   ALT 27  --   ALKPHOS 51  --   BILITOT 1.3*  --   GFRNONAA 34*  --   GFRAA 39*  --   ANIONGAP 11  --      Hematology Recent Labs  Lab 09/13/19 1102 09/16/19 1146 09/16/19 1151  WBC 19.7* 36.1*  --   RBC 4.27 4.12*  --   HGB 11.2* 10.6* 11.9*  HCT 34.4* 33.6* 35.0*  MCV 80.6 81.6  --   MCH 26.2 25.7*  --   MCHC 32.6 31.5  --   RDW 18.3* 18.3*  --   PLT 56* 62*  --     BNPNo results for input(s): BNP, PROBNP in the last 168 hours.   DDimer No  results for input(s): DDIMER in the last 168 hours.   Radiology    DG Chest Port 1 View  Result Date: 09/16/2019 CLINICAL DATA:  Postop fever. EXAM: PORTABLE CHEST 1 VIEW COMPARISON:  Radiograph 09/06/2019, CT 07/29/2019 FINDINGS: Post TAVR. Previous right internal jugular pacer is been removed. Battery pack projects over the left chest wall. Stable cardiomegaly. Aortic atherosclerosis. Central left lung mass is unchanged by radiograph. No evidence of acute airspace disease. No pleural fluid. No pneumothorax. No pulmonary edema. IMPRESSION: 1. No acute abnormality. 2. Stable mild cardiomegaly and left lung mass. Electronically Signed   By: Keith Rake M.D.   On: 09/16/2019 14:02   CT HEAD CODE STROKE WO CONTRAST  Result Date: 09/16/2019 CLINICAL DATA:  Code stroke. Possible stroke, neuro deficit, subacute. Right-sided facial droop, aphasia, last known well 10:00 AM EXAM: CT HEAD WITHOUT CONTRAST TECHNIQUE: Contiguous axial images were obtained from the base of the skull through the vertex without intravenous contrast. COMPARISON:  No pertinent prior studies available for comparison. FINDINGS: Brain: Mild generalized parenchymal atrophy. There is abnormal  cortical/subcortical hyperdensity within the mid left frontal lobe and left frontal operculum consistent with acute/early subacute infarction. The infarct measures 2.4 x 3.7 x 3.5 cm. There is mild regional mass effect. No ventricular effacement or midline shift. No evidence of hemorrhagic conversion. Background mild chronic small vessel ischemic disease. No extra-axial fluid collection. No evidence of intracranial mass. No midline shift. Vascular: No definite hyperdense vessel. Atherosclerotic calcifications. Skull: Normal. Negative for fracture or focal lesion. Sinuses/Orbits: Visualized orbits show no acute finding. Bilateral lens replacements. Mild ethmoid and maxillary sinus mucosal thickening. No significant mastoid effusion. ASPECTS ([place] Stroke Program Early CT Score) - Ganglionic level infarction (caudate, lentiform nuclei, internal capsule, insula, M1-M3 cortex): 7 - Supraganglionic infarction (M4-M6 cortex): 1 Total score (0-10 with 10 being normal): 8 These results were called by telephone at the time of interpretation on 09/16/2019 at 12:06 pm to provider Dr. Rory Percy, who verbally acknowledged these results. IMPRESSION: 1. Acute/early subacute ischemic infarction within the left MCA vascular territory within the mid left frontal lobe and left frontal operculum. ASPECTS is 8. Mild regional mass effect. No midline shift. No hemorrhagic conversion. 2. Background mild generalized parenchymal atrophy and chronic small vessel ischemic disease 3. Mild ethmoid sinus mucosal thickening. Electronically Signed   By: Kellie Simmering DO   On: 09/16/2019 12:06    Cardiac Studies   Echo: 09/12/19: IMPRESSIONS    1. Day 1 post TAVR with normal transaortic gradients with peak/mean 18/11  mmHg. Trivial paravalvular leak.  2. Left ventricular ejection fraction, by estimation, is 65 to 70%. The  left ventricle has normal function. The left ventricle has no regional  wall motion abnormalities. Left ventricular  diastolic function could not  be evaluated.  3. Right ventricular systolic function is normal. The right ventricular  size is normal. There is normal pulmonary artery systolic pressure.  4. The mitral valve is normal in structure. Trivial mitral valve  regurgitation. No evidence of mitral stenosis.  5. The aortic valve has been repaired/replaced. Aortic valve  regurgitation is not visualized. No aortic stenosis is present. There is a  26 mm Ultra, stented (TAVR) valve present in the aortic position.  Procedure Date: 09/06/2019. Echo findings are  consistent with normal structure and function of the aortic valve  prosthesis. Aortic valve mean gradient measures 11.0 mmHg.  6. The inferior vena cava is normal in size with greater than 50%  respiratory variability, suggesting right  atrial pressure of 3 mmHg.   FINDINGS  Left Ventricle: Left ventricular ejection fraction, by estimation, is 65  to 70%. The left ventricle has normal function. The left ventricle has no  regional wall motion abnormalities. Definity contrast agent was given IV  to delineate the left ventricular  endocardial borders. The left ventricular internal cavity size was normal  in size. There is no left ventricular hypertrophy. Left ventricular  diastolic function could not be evaluated.   Right Ventricle: The right ventricular size is normal. No increase in  right ventricular wall thickness. Right ventricular systolic function is  normal. There is normal pulmonary artery systolic pressure.   Left Atrium: Left atrial size was normal in size.   Right Atrium: Right atrial size was normal in size.   Pericardium: Trivial pericardial effusion is present. There is no evidence  of cardiac tamponade.   Mitral Valve: The mitral valve is normal in structure. Normal mobility of  the mitral valve leaflets. Trivial mitral valve regurgitation. No evidence  of mitral valve stenosis.   Tricuspid Valve: The tricuspid valve  is normal in structure. Tricuspid  valve regurgitation is trivial. No evidence of tricuspid stenosis.   Aortic Valve: The aortic valve has been repaired/replaced. Aortic valve  regurgitation is not visualized. No aortic stenosis is present. Aortic  valve mean gradient measures 11.0 mmHg. Aortic valve peak gradient  measures 18.2 mmHg. Aortic valve area, by  VTI measures 3.75 cm. There is a 26 mm Ultra, stented (TAVR) valve  present in the aortic position. Procedure Date: 09/06/2019. Echo findings  are consistent with normal structure and function of the aortic valve  prosthesis.   Pulmonic Valve: The pulmonic valve was normal in structure. Pulmonic valve  regurgitation is not visualized. No evidence of pulmonic stenosis.   Aorta: The aortic root is normal in size and structure.   Venous: The inferior vena cava is normal in size with greater than 50%  respiratory variability, suggesting right atrial pressure of 3 mmHg.   IAS/Shunts: No atrial level shunt detected by color flow Doppler.    Patient Profile     86 y.o. male with CAD, severe AS s/p TAVR on 09/06/19, RBBB, chronic thrombocytopenia and urinary retention who was discharged home last week after TAVR with a foley in place for urinary retention. He had the foley catheter removed two days ago in the urology office and has not voided well since then. His wife notes increasing LE edema over the past few days.  He went into the urology office today for follow up after his wife reported fevers at home. While in the urology office, he developed facial droop and aphasia and was brought to [facility] via EMS. He was seen by Neurology and not felt to a candidate for tPA. Stroke like symptoms resolved. Head CT with evidence of acute/subacute infarct in the left frontal lobe. Of note, he was discharged on ASA alone following his TAVR given history of profound thrombocytopenia which is followed closely in hematology by Dr. Benay Spice. He was wearing a  cardiac monitor post TAVR and had atrial fibrillation noted. The valve team discussed this and elected to wait until we saw his total AF burden to consider whether or not anti-coagulation should be discussed with Hematology given his chronic issues.   Assessment & Plan    1. Urosepsis. Fever spiked to 103 last night. WBC up to 36K. Cultures pending. On broad spectrum antibiotics per primary team. Doubt endocarditis. Repeat Echo pending. Foley in place.  2. Recent left frontal lobe CVA. ? Complication from TAVR versus Afib. Not a good candidate for anticoagulation due to thrombocytopenia. Per Dr Angelena Form OK to resume ASA 81 mg daily. Neurologically intact today 3. Severe AS s/p recent TAVR.  4. Paroxysmal Afib. Low burden. In NSR here.  5. Chronic diastolic CHF. Given lasix yesterday. Will hold with AKI 6. CAD stable by cath in May 2021.  7. HDL on statin 8. HTN BP soft - metoprolol on hold.  9. AKI. Due to sepsis. Creatinine 1.72 yesterday. Would hold lasix for now pending stabilization of renal function.     For questions or updates, please contact [place] Please consult [email or web address] for contact info under        Signed, Tyress Loden [place], MD  09/17/2019, 8:16 AM

## 2019-09-17 NOTE — Progress Notes (Signed)
[name]  QHU:[id number] DOB: 14-Jan-1924 DOA: 09/16/2019 PCP: [name], MD    Brief Narrative:  86 year old with a history of CAD, aortic stenosis status post TAVR 09/06/2019, RBBB, LAFB, urinary retention with frequent UTIs, chronic diastolic CHF, colon cancer, chronic thrombocytopenia, hairy cell lymphoma status post splenectomy, chronic leukocytosis, and BPH who presented to the ED with altered mental status.  The patient was doing well status post TAVR.  His postoperative aspirin had to be discontinued due to large bruises and hematoma of the right groin with a low platelet count.  The patient developed the abrupt onset of chills and urinary retention.  His wife found his temperature to be 101.  The patient visited his urologist office where a Foley catheter had to be placed and it was noted that he was suffering with a right facial droop and acute aphasia at that time.  He was therefore sent to the ED via EMS.  In the ED CT confirmed a large left-sided MCA acute versus subacute CVA.  UA was also consistent with a UTI.  Exam in the ED however revealed no acute neurologic deficits.  Antimicrobials:  Rocephin 7/16 Cefepime 7/17 >  Subjective: T-max of 103 last night.  Sitting up in a bedside chair resting comfortably at time of my visit.  Denies any lasting neurologic deficits.  Reports that he feels completely back to his baseline.  Denies chest pain nausea vomiting or abdominal pain.  Is a very pleasant individual.  Assessment & Plan:  Sepsis due to urinary tract infection - UTI present on admission -gram-negative rod bacteremia Cont empiric abx tx while awaiting speciation and sensitivities   Acute left frontal lobe stroke Workup as per Stroke Team  Acute kidney injury Creatinine 1.01 at time of discharge 09/09/2019 -1.72 at presentation -slowly improving presently  Recent Labs  Lab 09/16/19 1146 09/16/19 1151 09/17/19 0752  CREATININE 1.72* 1.60* 1.51*    Aortic  stenosis status post TAVR 09/06/2019 Discharged home on aspirin alone given chronic thrombocytopenia, and this had to be stopped in f/u w/ his Hematologist due to declining Plt count   Paroxysmal newly diagnosed atrial fibrillation Patient was sent home post TAVR with a cardiac monitor which did confirm intermittent episodes of atrial fibrillation - cardiology was following to determine arrhythmia burden to make a difficult decision regarding anticoagulation in the setting of chronic thrombocytopenia - Cards attending to this issue   Recurrent urinary retention Required discharge home with Foley catheter -subsequently removed 7/14 with recurrent urinary retention necessitating replacement of catheter 7/16 and urologist office  Chronic thrombocytopenia Followed by Dr. Benay Spice   Chronic leukocytosis WBC 26 at time of discharge 09/09/2019  CAD Cardiac cath 5/25 noted chronically occluded mid LAD, moderate disease in the diagonals and mid RCA -medical management  Chronic diastolic CHF No gross volume overload evident presently  Mild hyponatremia Monitor trend  Microcytic anemia No evidence of blood loss to a significant extent -follow  HLD Continue usual medical therapy  HTN Blood pressure mildly low at present  Elevated hemoglobin A1c A1c 6.6 at presentation -monitor CBG   DVT prophylaxis: SCDs Code Status: FULL CODE Family Communication:  Status is: Inpatient  Remains inpatient appropriate because:IV treatments appropriate due to intensity of illness or inability to take PO   Dispo: The patient is from: Home              Anticipated d/c is to: Home  Anticipated d/c date is: 3 days              Patient currently is not medically stable to d/c.    Consultants:  Stroke Team Cardiology Infectious Disease  Objective: Blood pressure (!) 107/56, pulse (!) 46, temperature 98.6 F (37 C), temperature source Oral, resp. rate 18, height 5\' 4"  (1.626 m), weight  79.5 kg, SpO2 93 %.  Intake/Output Summary (Last 24 hours) at 09/17/2019 1547 Last data filed at 09/16/2019 1721 Gross per 24 hour  Intake 200 ml  Output 300 ml  Net -100 ml   Filed Weights   09/16/19 1221  Weight: 79.5 kg    Examination: General: No acute respiratory distress Lungs: Clear to auscultation bilaterally without wheezes or crackles Cardiovascular: Regular rate and rhythm without murmur  Abdomen: Nontender, nondistended, soft, bowel sounds positive, no rebound, no ascites, no appreciable mass Extremities: No significant cyanosis, clubbing, or edema bilateral lower extremities  CBC: Recent Labs  Lab 09/13/19 1102 09/13/19 1102 09/16/19 1146 09/16/19 1151 09/17/19 0752  WBC 19.7*  --  36.1*  --  26.2*  NEUTROABS 12.7*  --  28.3*  --  21.3*  HGB 11.2*  --  10.6* 11.9* 10.4*  HCT 34.4*   < > 33.6* 35.0* 32.7*  MCV 80.6  --  81.6  --  79.4*  PLT 56*  --  62*  --  73*   < > = values in this interval not displayed.   Basic Metabolic Panel: Recent Labs  Lab 09/16/19 1146 09/16/19 1151 09/17/19 0752  NA 133* 133* 134*  K 4.2 4.0 3.6  CL 97* 94* 100  CO2 25  --  21*  GLUCOSE 97 95 91  BUN 23 26* 20  CREATININE 1.72* 1.60* 1.51*  CALCIUM 8.4*  --  7.8*   GFR: Estimated Creatinine Clearance: 29.1 mL/min (A) (by C-G formula based on SCr of 1.51 mg/dL (H)).  Liver Function Tests: Recent Labs  Lab 09/16/19 1146  AST 24  ALT 27  ALKPHOS 51  BILITOT 1.3*  PROT 4.9*  ALBUMIN 2.8*    Coagulation Profile: Recent Labs  Lab 09/16/19 1146  INR 1.1    HbA1C: Hgb A1c MFr Bld  Date/Time Value Ref Range Status  09/17/2019 07:52 AM 6.6 (H) 4.8 - 5.6 % Final    Comment:    (NOTE) Pre diabetes:          5.7%-6.4%  Diabetes:              >6.4%  Glycemic control for   <7.0% adults with diabetes   09/02/2019 08:47 AM 6.5 (H) 4.8 - 5.6 % Final    Comment:    (NOTE) Pre diabetes:          5.7%-6.4%  Diabetes:              >6.4%  Glycemic control  for   <7.0% adults with diabetes     CBG: Recent Labs  Lab 09/16/19 1146  GLUCAP 78    Recent Results (from the past 240 hour(s))  Urine culture     Status: Abnormal (Preliminary result)   Collection Time: 09/16/19 12:30 PM   Specimen: In/Out Cath Urine  Result Value Ref Range Status   Specimen Description IN/OUT CATH URINE  Final   Special Requests NONE  Final   Culture (A)  Final    >=100,000 COLONIES/mL PSEUDOMONAS AERUGINOSA SUSCEPTIBILITIES TO FOLLOW Performed at [facility], [address], [place], [place]  [address]    Report Status PENDING  Incomplete  Blood Culture (routine x 2)     Status: None (Preliminary result)   Collection Time: 09/16/19 12:58 PM   Specimen: BLOOD LEFT HAND  Result Value Ref Range Status   Specimen Description BLOOD LEFT HAND  Final   Special Requests   Final    BOTTLES DRAWN AEROBIC AND ANAEROBIC Blood Culture results may not be optimal due to an inadequate volume of blood received in culture bottles   Culture  Setup Time   Final    GRAM NEGATIVE RODS AEROBIC BOTTLE ONLY CRITICAL VALUE NOTED.  VALUE IS CONSISTENT WITH PREVIOUSLY REPORTED AND CALLED VALUE. Performed at [facility], [place] [address] [place], [place] [address]    Culture GRAM NEGATIVE RODS  Final   Report Status PENDING  Incomplete  Blood Culture (routine x 2)     Status: None (Preliminary result)   Collection Time: 09/16/19  1:00 PM   Specimen: BLOOD  Result Value Ref Range Status   Specimen Description BLOOD RIGHT ANTECUBITAL  Final   Special Requests   Final    BOTTLES DRAWN AEROBIC AND ANAEROBIC Blood Culture adequate volume   Culture  Setup Time   Final    GRAM NEGATIVE RODS AEROBIC BOTTLE ONLY Organism ID to follow CRITICAL RESULT CALLED TO, READ BACK BY AND VERIFIED WITH: Rubie Maid [id number] MLM Performed at [facility], [place] [address] [place], [place] [address]    Culture GRAM NEGATIVE RODS  Final   Report Status PENDING   Incomplete  Blood Culture ID Panel (Reflexed)     Status: Abnormal   Collection Time: 09/16/19  1:00 PM  Result Value Ref Range Status   Enterococcus species NOT DETECTED NOT DETECTED Final   Listeria monocytogenes NOT DETECTED NOT DETECTED Final   Staphylococcus species NOT DETECTED NOT DETECTED Final   Staphylococcus aureus (BCID) NOT DETECTED NOT DETECTED Final   Streptococcus species NOT DETECTED NOT DETECTED Final   Streptococcus agalactiae NOT DETECTED NOT DETECTED Final   Streptococcus pneumoniae NOT DETECTED NOT DETECTED Final   Streptococcus pyogenes NOT DETECTED NOT DETECTED Final   Acinetobacter baumannii NOT DETECTED NOT DETECTED Final   Enterobacteriaceae species NOT DETECTED NOT DETECTED Final   Enterobacter cloacae complex NOT DETECTED NOT DETECTED Final   Escherichia coli NOT DETECTED NOT DETECTED Final   Klebsiella oxytoca NOT DETECTED NOT DETECTED Final   Klebsiella pneumoniae NOT DETECTED NOT DETECTED Final   Proteus species NOT DETECTED NOT DETECTED Final   Serratia marcescens NOT DETECTED NOT DETECTED Final   Carbapenem resistance NOT DETECTED NOT DETECTED Final   Haemophilus influenzae NOT DETECTED NOT DETECTED Final   Neisseria meningitidis NOT DETECTED NOT DETECTED Final   Pseudomonas aeruginosa DETECTED (A) NOT DETECTED Final    Comment: CRITICAL RESULT CALLED TO, READ BACK BY AND VERIFIED WITH: PHARMD T DANG [id number] MLM    Candida albicans NOT DETECTED NOT DETECTED Final   Candida glabrata NOT DETECTED NOT DETECTED Final   Candida krusei NOT DETECTED NOT DETECTED Final   Candida parapsilosis NOT DETECTED NOT DETECTED Final   Candida tropicalis NOT DETECTED NOT DETECTED Final    Comment: Performed at [facility], [address], [place], [place] [address]  SARS Coronavirus 2 by RT PCR (hospital order, performed in [facility] hospital lab) Nasopharyngeal Nasopharyngeal Swab     Status: None   Collection Time: 09/16/19  1:33 PM   Specimen:  Nasopharyngeal  Swab  Result Value Ref Range Status   SARS Coronavirus 2 NEGATIVE NEGATIVE Final    Comment: (NOTE) SARS-CoV-2 target nucleic acids are NOT DETECTED.  The SARS-CoV-2 RNA is generally detectable in upper and lower respiratory specimens during the acute phase of infection. The lowest concentration of SARS-CoV-2 viral copies this assay can detect is 250 copies / mL. A negative result does not preclude SARS-CoV-2 infection and should not be used as the sole basis for treatment or other patient management decisions.  A negative result may occur with improper specimen collection / handling, submission of specimen other than nasopharyngeal swab, presence of viral mutation(s) within the areas targeted by this assay, and inadequate number of viral copies (<250 copies / mL). A negative result must be combined with clinical observations, patient history, and epidemiological information.  Fact Sheet for Patients:   StrictlyIdeas.no  Fact Sheet for Healthcare Providers: BankingDealers.co.za  This test is not yet approved or  cleared by the [place] FDA and has been authorized for detection and/or diagnosis of SARS-CoV-2 by FDA under an Emergency Use Authorization (EUA).  This EUA will remain in effect (meaning this test can be used) for the duration of the COVID-19 declaration under Section 564(b)(1) of the Act, 21 U.S.C. section 360bbb-3(b)(1), unless the authorization is terminated or revoked sooner.  Performed at [facility], [place] [address], [place], [place] [address]      Scheduled Meds: . aspirin EC  81 mg Oral Daily  . atorvastatin  20 mg Oral q1800  . Chlorhexidine Gluconate Cloth  6 each Topical Daily  . cholecalciferol  2,000 Units Oral QPM  . clopidogrel  75 mg Oral Daily  . hydrocortisone cream   Topical BID  . latanoprost  1 drop Both Eyes QHS  . pantoprazole  40 mg Oral Daily  . predniSONE  40 mg  Oral Q breakfast  . sertraline  50 mg Oral QPM  . tamsulosin  0.8 mg Oral QPC supper  . timolol  1 drop Right Eye Daily   Continuous Infusions: . ceFEPime (MAXIPIME) IV 2 g (09/17/19 1246)     LOS: 1 day   Cherene Altes, MD [facility] Office  [phone] Pager - Text Page per Amion  If 7PM-7AM, please contact night-coverage per Amion 09/17/2019, 3:47 PM

## 2019-09-17 NOTE — Progress Notes (Signed)
STROKE TEAM PROGRESS NOTE   HISTORY OF PRESENT ILLNESS (per record) [name] is a 86 y.o. male past medical history of coronary artery disease, colon cancer, degenerative joint disease, leukemia, aortic stenosis status post TAVR on 09/06/2019, brought by EMS from home where he was with his wife who noted that he had sudden onset of difficulty with his speech as well as right facial droop that was noted sometime this morning.  EMS brought him with a last known well time of 10 AM but upon speaking with the wife over the phone, she reports that he has been not feeling well for at least the past 2 to 3 days, most of the problems have been with his urinary catheter and urinary retention.  There was plans of starting him on antibiotics for possible UTI. Upon EMS evaluation of the patient at home, he had a fever of 101 F and on arrival here in the emergency room his systolic blood pressures in the 90s.  Palpable blood pressure by EMS was in the 130s. On my examination here - I examined him on the bridge, with no focal neurological deficits with the exception of diminished auditory acuity bilaterally.  NIH stroke scale 0. LKW: Couple of days ago with some acute worsening at 10 AM today tpa given?: no, outside the window Premorbid modified Rankin scale (mRS): 3   INTERVAL HISTORY I have personally reviewed history of presenting illness, electronic medical records and imaging films in PACS. He presented with right facial droop and speech difficulties in the setting of fever and UTI and CT scan shows subacute left MCA branch infarct. His urine is growing Pseudomonas and 1 out of 4 blood culture is also positive. Pseudomonas endocarditis is a possibility given his recent aortic valve surgery but 2D echo looks pretty good. Given his advanced age of 38 not sure further invasive work-up with TEE is necessary and since is wearing the Zio patch MRI may not be possible at the present time.    OBJECTIVE Vitals:    09/17/19 0528 09/17/19 0600 09/17/19 0645 09/17/19 0727  BP:    (!) 107/56  Pulse:    (!) 46  Resp: (!) 23 (!) 23 19 18   Temp: (!) 101.4 F (38.6 C)  99.2 F (37.3 C) 98.6 F (37 C)  TempSrc: Oral   Oral  SpO2:    93%  Weight:      Height:        CBC:  Recent Labs  Lab 09/16/19 1146 09/16/19 1146 09/16/19 1151 09/17/19 0752  WBC 36.1*  --   --  26.2*  NEUTROABS 28.3*  --   --  21.3*  HGB 10.6*   < > 11.9* 10.4*  HCT 33.6*   < > 35.0* 32.7*  MCV 81.6  --   --  79.4*  PLT 62*  --   --  73*   < > = values in this interval not displayed.    Basic Metabolic Panel:  Recent Labs  Lab 09/16/19 1146 09/16/19 1146 09/16/19 1151 09/17/19 0752  NA 133*   < > 133* 134*  K 4.2   < > 4.0 3.6  CL 97*   < > 94* 100  CO2 25  --   --  21*  GLUCOSE 97   < > 95 91  BUN 23   < > 26* 20  CREATININE 1.72*   < > 1.60* 1.51*  CALCIUM 8.4*  --   --  7.8*   < > =  values in this interval not displayed.    Lipid Panel:     Component Value Date/Time   CHOL 96 09/17/2019 0752   TRIG 115 09/17/2019 0752   HDL 30 (L) 09/17/2019 0752   CHOLHDL 3.2 09/17/2019 0752   VLDL 23 09/17/2019 0752   LDLCALC 43 09/17/2019 0752   HgbA1c:  Lab Results  Component Value Date   HGBA1C 6.6 (H) 09/17/2019   Urine Drug Screen: No results found for: LABOPIA, COCAINSCRNUR, LABBENZ, AMPHETMU, THCU, LABBARB  Alcohol Level No results found for: [facility]  IMAGING  DG Chest Port 1 View 09/16/2019 IMPRESSION:  1. No acute abnormality.  2. Stable mild cardiomegaly and left lung mass.  CT HEAD CODE STROKE WO CONTRAST 09/16/2019 IMPRESSION:  1. Acute/early subacute ischemic infarction within the left MCA vascular territory within the mid left frontal lobe and left frontal operculum. ASPECTS is 8. Mild regional mass effect. No midline shift. No hemorrhagic conversion.  2. Background mild generalized parenchymal atrophy and chronic small vessel ischemic disease  3. Mild ethmoid sinus mucosal  thickening.     Transthoracic Echocardiogram  Normal left ventricular ejection fraction of 60 to 65%. Limited visualization of the [place]  aortic valve prosthesis without definite thrombus or vegetation visualized. Bilateral Carotid Dopplers 07/09/19 Summary:  Right Carotid: Velocities in the right ICA are consistent with a 1-39% stenosis.  Left Carotid: Velocities in the left ICA are consistent with a 1-39%  stenosis.  Vertebrals: Bilateral vertebral arteries demonstrate antegrade flow.   ECG - SR rate 77 BPM. (See cardiology reading for complete details)   PHYSICAL EXAM Blood pressure (!) 107/56, pulse (!) 46, temperature 98.6 F (37 C), temperature source Oral, resp. rate 18, height 5\' 4"  (1.626 m), weight 79.5 kg, SpO2 93 %. Pleasant elderly Caucasian male not in distress. . Afebrile. Head is nontraumatic. Neck is supple without bruit.    Cardiac exam no murmur or gallop. Lungs are clear to auscultation. Distal pulses are well felt.  Neurological Exam ;  Awake  Alert oriented x 3. Diminished attention, registration and recall. Normal speech and language.eye movements full without nystagmus.fundi were not visualized. Vision acuity and fields appear normal. Hearing is normal. Palatal movements are normal. Mild right lower facial asymmetry when he smiles.. Tongue midline. Normal strength, tone, reflexes and coordination. Normal sensation. Gait deferred.       ASSESSMENT/PLAN [name] is a 86 y.o. male with history of coronary artery disease (stent), Htn, Leukemia, anemia, malnutrition, colon cancer, degenerative joint disease, diminished auditory acuity bilaterally, leukemia, aortic stenosis status post TAVR on 09/06/2019, who had been not feeling well for at least the past 2 to 3 days, most of the problems have been with his urinary catheter and urinary retention - suspected UTI, presenting with sudden onset of difficulty with his speech as well as right facial droop,  fever 101 and mildly low BP. He did not receive IV t-PA due to late presentation (>4.5 hours from time of onset) and mild deficits.  Stroke: Acute/early subacute ischemic infarction within the left MCA vascular territory within the mid left frontal lobe and left frontal operculum - embolic - likely atrial fibrillation.  Resultant no deficits  Code Stroke CT Head - Acute/early subacute ischemic infarction within the left MCA vascular territory within the mid left frontal lobe and left frontal operculum. ASPECTS is 8. Mild regional mass effect. No midline shift. No hemorrhagic conversion. Background mild generalized parenchymal atrophy and chronic small vessel ischemic disease  CT head -subacute left MCA branch infarct  MRI head -pending  MRA head -pending  CTA H&N -pending  Carotid Doppler - 07/09/19 - normal  2D Echo -normal ejection fraction. Normal functioning aortic valve s/p TAVR  Lacey Jensen Virus 2 - negative  LDL - 43  HgbA1c - 6.6  UDS - not ordered  VTE prophylaxis - SCDs Diet  Diet Order            Diet heart healthy/carb modified Room service appropriate? Yes; Fluid consistency: Thin  Diet effective now                 No antithrombotic prior to admission, now on No antithrombotic  Ongoing aggressive stroke risk factor management  Therapy recommendations:  pending  Disposition:  Pending  Hypertension  Home BP meds: Lopressor  Current BP meds: none   BP mildly low . Permissive hypertension (OK if < 220/120) but gradually normalize in 5-7 days  . Long-term BP goal normotensive  Hyperlipidemia  Home Lipid lowering medication: Lipitor 20 mg daily  LDL 43, goal < 70  Current lipid lowering medication: Lipitor 20 mg daily   Continue statin at discharge  Diabetes  Home diabetic meds: none   Current diabetic meds: none  HgbA1c 6.6, goal < 7.0 Recent Labs    09/16/19 1146  GLUCAP 78    Other Stroke Risk Factors  Advanced age  Former  cigarette smoker - quit  ETOH use, advised to drink no more than 1 alcoholic beverage per day.  Obesity, Body mass index is 30.08 kg/m., recommend weight loss, diet and exercise as appropriate   Family hx stroke (mother)   Coronary artery disease  Recently diagnosed atrial fibrillation  CHF  Other Active Problems  Code status - Full Code  Fever - 101.4 -> 98.6  Leukocytosis - 36.1->26.2  Leukemia   Anemia - Hgb - 10.4  Thrombocytopenia - 73 - Dr. Benay Spice had stopped ASA due to decrease of platelets 09/13/19  UTI / Bacteremia -> Rocephin  Bradycardia - 40's    Hyponatremia - 134 CKD - stage 3b - creatinine - 1.72->1.60->1.51  Atrial fibrillation (PAF) - cardiology onboard  Known lung mass   Hypocalcemia - 7.8  Hospital day # 1  Patient presented with some slurred speech and right facial droop and CT scan showed subacute left MCA branch infarct etiology unclear possibly embolic from A. fib though I doubt endocarditis of aortic valve given positive urine and blood cultures as Pseudomonas rarely causes endocarditis. May not be able to obtain MRI as patient is wearing a Zio patch for A. fib. Recommend aspirin 81 mg daily patient is not a good candidate for dual antiplatelet therapy or anticoagulation given his thrombocytopenia. May need to consider doing TEE to look at the aortic valve more carefully to decide on duration of antibiotics if ID feels strongly about it. Discussed with Dr. Thereasa Solo. Greater than 50% time during this 35-minute visit was spent on counseling and coordination of care about his embolic stroke and discussion about evaluation treatment plan and answering questions Antony Contras, MD To contact Stroke Continuity provider, please refer to [email or web address] After hours, contact General Neurology

## 2019-09-17 NOTE — Evaluation (Addendum)
Speech Language Pathology Evaluation Patient Details Name: [name] MRN: [id number] DOB: August 14, 1923 Today's Date: 09/17/2019 Time: 4098-1191 SLP Time Calculation (min) (ACUTE ONLY): 25 min  Problem List:  Patient Active Problem List   Diagnosis Date Noted  . Stroke (cerebrum) ([place]) 09/16/2019  . Sepsis ([place]) 09/16/2019  . Urinary tract infection without hematuria   . Acute on chronic diastolic heart failure ([place]) 09/06/2019  . Bifascicular block 09/06/2019  . 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 Severe aortic stenosis   . Chronic ITP (idiopathic thrombocytopenia) ([facility]) 07/04/2019  . Chronic eczematous otitis externa of both ears 01/15/2016  . Sensorineural hearing loss (SNHL) of both ears 01/15/2016  . Prostate nodule 03/09/2015  . Constipation 06/22/2014  . Malaise and fatigue 06/22/2014  . CAD (coronary artery disease)   . GERD (gastroesophageal reflux disease)   . Hypertension   . Dyslipidemia   . Colon cancer ([place])   . Solitary pulmonary nodule 02/11/2013  . Leukemic reticuloendotheliosis, extranodal and solid organ sites [facility]) 01/03/2013  . Glaucoma 11/27/2011  . Personal history of other malignant neoplasm of skin 10/14/2011  . Elevated PSA 09/12/2011  . Liposarcoma, well differentiated type ([place]) 09/12/2011  . Traction detachment of retina 11/18/2010  . Exudative age-related macular degeneration ([place]) 04/16/2010  . Epiretinal membrane 04/16/2010  . Vitreous degeneration 04/16/2010  . Staphylococcus aureus infection 11/30/2006   Past Medical History:  Past Medical History:  Diagnosis Date  . Acute appendicitis   . Anemia   . Anxiety   . Arthritis    "back" (03/14/2014)  . Blood dyscrasia    hairy cell leukemia  . CAD (coronary artery disease)    a. 03/14/14  s/p overlapping DES x2 to mid-distal RCA.  . 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 Colon cancer ([place]) 1984  . Compression fracture of lumbar spine,  non-traumatic ([place])   . DJD (degenerative joint disease) of lumbar spine   . Dyslipidemia   . Dyspnea   . Elevated PSA   . GERD (gastroesophageal reflux disease)   . Glaucoma   . Hairy cell leukemia ([place]) dx'd 1980  . Heart murmur   . History of blood transfusion    "several; related to hairy cell leukemia & tx "  . History of stomach ulcers 1968  . Hypertension   . Leukemia, hairy cell ([place])   . Malnutrition ([place])   . Osteoarthritis   . Osteoporosis   . Pneumonia   . 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 Angelena Form and Cyndia Bent.   . Severe aortic stenosis    s/p tavr  . Skin cancer of face    Past Surgical History:  Past Surgical History:  Procedure Laterality Date  . APPENDECTOMY  10/2007  . CATARACT EXTRACTION, BILATERAL Bilateral 02/2008  . COLON SURGERY  1984   "sigmoid; open"  . CORONARY ANGIOPLASTY WITH STENT PLACEMENT  03/14/2014   "2"  . EYE SURGERY Bilateral    cataract  . FRACTURE SURGERY    . LEFT HEART CATHETERIZATION WITH CORONARY ANGIOGRAM N/A 03/14/2014   Procedure: LEFT HEART CATHETERIZATION WITH CORONARY ANGIOGRAM;  Surgeon: Peter M Martinique, MD;  Location: [facility] C[facility] LAB;  Service: Cardiovascular;  Laterality: N/A;  . LIPOMA EXCISION Right 07/2011   liposarcoma resection; "back"  . MOHS SURGERY Left 02/2011  . MOHS SURGERY  X 3   "all on my face"  .  MOLE REMOVAL Left 1985   cheek  . ORIF ANKLE FRACTURE Left 2000  . RIGHT/LEFT HEART CATH AND CORONARY ANGIOGRAPHY N/A 07/26/2019   Procedure: RIGHT/LEFT HEART CATH AND CORONARY ANGIOGRAPHY;  Surgeon: Belva Crome, MD;  Location: [place] CV LAB;  Service: Cardiovascular;  Laterality: N/A;  . SHOULDER SURGERY  04/2004  . SPLENECTOMY  1992  . TEE WITHOUT CARDIOVERSION N/A 09/06/2019   Procedure: TRANSESOPHAGEAL ECHOCARDIOGRAM (TEE);  Surgeon: Burnell Blanks, MD;  Location: [place] CV LAB;  Service: Open Heart Surgery;   Laterality: N/A;  . TONSILLECTOMY AND ADENOIDECTOMY  1939  . TRANSCATHETER AORTIC VALVE REPLACEMENT, TRANSFEMORAL N/A 09/06/2019   Procedure: TRANSCATHETER AORTIC VALVE REPLACEMENT, TRANSFEMORAL;  Surgeon: Burnell Blanks, MD;  Location: [place] CV LAB;  Service: Open Heart Surgery;  Laterality: N/A;   HPI:  86 yo male adm [facility] with acute CVA and urosepsis, pt is s/p TAVR 10 days priro to admit.  Pt found to have acute/subacute ischemic infarct within the left MCA vascular territory within the mid left forntal lobe and left frontal operculum- mild regional mass effect.  Speech evaluation ordered, pt passed a Yale swallow screen.  Pt resides with his wife of 30 years.   Assessment / Plan / Recommendation Clinical Impression  SLUMS administered to pt with resulting score 24/30 (mild cog deficits per scoring) - difficulties with name generation and recall of individual items (4/5) noted - but suspect his hearing loss may have impacted his retention of words.   Pt with negative CN exam, however he does have left eye ptosis - reports having injections on his eye for macular degeneration.  Speech and language are fluent without dysphasia nor dysarthria.  No SLP follow up indicated at this time.  Pt educated and agreeable to plan.    SLP Assessment  SLP Recommendation/Assessment: Patient does not need any further Speech Lanaguage Pathology Services    Follow Up Recommendations  None    Frequency and Duration     n/a      SLP Evaluation Cognition  Overall Cognitive Status: Within Functional Limits for tasks assessed Arousal/Alertness: Awake/alert Orientation Level: Oriented X4 Attention: Sustained;Selective Sustained Attention: Appears intact Selective Attention:  (DNT due to pt's hearing loss) Memory: Impaired (recalled 4/5 items without cue) Memory Impairment: Retrieval deficit Awareness: Appears intact Problem Solving: Appears intact       Comprehension  Auditory  Comprehension Overall Auditory Comprehension: Appears within functional limits for tasks assessed Yes/No Questions: Not tested Commands: Within Functional Limits Conversation: Complex Other Conversation Comments: recall of 4 specific items from paragraph, pt able to answer open ended questions easily Interfering Components: Hearing Visual Recognition/Discrimination Discrimination: Within Function Limits Reading Comprehension Reading Status: Not tested (pt able to draw a clock appropriately)    Expression Expression Primary Mode of Expression: Verbal Verbal Expression Overall Verbal Expression: Appears within functional limits for tasks assessed Initiation: No impairment Level of Generative/Spontaneous Verbalization: Conversation Repetition: No impairment Naming: Not tested Pragmatics: No impairment Non-Verbal Means of Communication: Not applicable Written Expression Dominant Hand: Right Written Expression:  (able to complete clock, did not test expressive language via writing but would be fluent)   Oral / Motor  Oral Motor/Sensory Function Overall Oral Motor/Sensory Function: Within functional limits Motor Speech Overall Motor Speech: Appears within functional limits for tasks assessed Respiration: Within functional limits Resonance: Within functional limits Articulation: Within functional limitis Intelligibility: Intelligible Motor Planning: Witnin functional limits   GO  [name] 09/17/2019, 12:12 PM   Kathleen Lime, [place] Office [phone]

## 2019-09-17 NOTE — Progress Notes (Signed)
°   09/17/19 0339  Assess: MEWS Score  Temp (!) 103 F (39.4 C)  BP 116/60  Pulse Rate 88  Resp 17  SpO2 92 %  O2 Device Room Air  Assess: MEWS Score  MEWS Temp 2  MEWS Systolic 0  MEWS Pulse 0  MEWS RR 0  MEWS LOC 0  MEWS Score 2  MEWS Score Color Yellow  Assess: if the MEWS score is Yellow or Red  Were vital signs taken at a resting state? Yes  Focused Assessment Documented focused assessment  Early Detection of Sepsis Score *See Row Information* Low  MEWS guidelines implemented *See Row Information* No, previously yellow, continue vital signs every 4 hours (pt noted to be febrile, denies add'l sx. Will give tylenol &)

## 2019-09-17 NOTE — Progress Notes (Signed)
Pharmacy Antibiotic Note  [name] is a 86 y.o. male admitted on 09/16/2019 with AMS, found to have Pseudomonas bacteremia and UTI.  Pharmacy has been consulted to switch from Rocephin to cefepime empirically.  Renal function improving, afebrile, WBC 26.2, LA down 1.4.  Plan: Cefepime 2gm IV Q24H Monitor renal fxn, micro data  Height: 5\' 4"  (162.6 cm) Weight: 79.5 kg (175 lb 4.3 oz) IBW/kg (Calculated) : 59.2  Temp (24hrs), Avg:99.5 F (37.5 C), Min:97.6 F (36.4 C), Max:103 F (39.4 C)  Recent Labs  Lab 09/13/19 1102 09/16/19 1146 09/16/19 1151 09/16/19 1300 09/16/19 1632 09/17/19 0752  WBC 19.7* 36.1*  --   --   --  26.2*  CREATININE  --  1.72* 1.60*  --   --  1.51*  LATICACIDVEN  --   --   --  1.7 1.4  --     Estimated Creatinine Clearance: 29.1 mL/min (A) (by C-G formula based on SCr of 1.51 mg/dL (H)).    Allergies  Allergen Reactions  . Antazoline Anaphylaxis  . Antihistamines, Chlorpheniramine-Type     Nervous, jittery  . Sulfa Antibiotics Rash   CTX x1 7/16 Cefepime 7/17 >>  7/16 UCx - Pseudomonas 7/16 BCx - 1/4 GNR (BCID Pseu, no KPC)  [name], PharmD, BCPS, [place] 09/17/2019, 11:24 AM

## 2019-09-17 NOTE — Evaluation (Signed)
Occupational Therapy Evaluation Patient Details Name: [name] MRN: [id number] DOB: Dec 09, 1923 Today's Date: 09/17/2019    History of Present Illness [name] is a 86 yo male presenting with AMS. Upon work-up, imaging revealed large, left-sided MCA acute/early subacute infarct and UA indicates UTI. PMH includes: AS s/p TAVR on 09/06/19, CAD, HTN, chronic thrombocytopenia, hairy cell lymphoma s/p splenectomy, and chronic leukocytosis.   Clinical Impression   This 86 y/o male presents with the above. PTA pt typically independent with ADL and mobility tasks. Pt very pleasant and willing to participate with therapy today. Overall pt requiring minA (via HHA today) for room level mobility, requiring overall minA for ADL tasks; pt with limitations mostly due to generalized weakness and unsteadiness in standing. Pt lives at home with spouse who was present and supportive during session, is able to assist PRN after discharge. Pt to benefit from continued acute OT services and currently recommend follow up [place] services to progress pt's overall safety and independence with ADL and mobility.     Follow Up Recommendations  Home health OT;Supervision/Assistance - 24 hour    Equipment Recommendations  3 in 1 bedside commode           Precautions / Restrictions Precautions Precautions: Fall Precaution Comments: is s/p TAVR 7/6 with cardiac monitor still in place Restrictions Weight Bearing Restrictions: No      Mobility Bed Mobility Overal bed mobility: Needs Assistance Bed Mobility: Supine to Sit     Supine to sit: Min assist     General bed mobility comments: supervision for line managment, mina to steady initially, but progressed to supervision. Pt able to scoot at EOB without assist. increased time/effort throughout   Transfers Overall transfer level: Needs assistance Equipment used: None Transfers: Sit to/from Stand Sit to Stand: Min assist         General transfer  comment: minA of 1 to stand, pt able to static stand without assist or UE support    Balance Overall balance assessment: Needs assistance Sitting-balance support: No upper extremity supported;Feet supported Sitting balance-Leahy Scale: Good     Standing balance support: Single extremity supported;During functional activity Standing balance-Leahy Scale: Fair Standing balance comment: able to static stand without support, minA through HHA to ambulate                           ADL either performed or assessed with clinical judgement   ADL Overall ADL's : Needs assistance/impaired Eating/Feeding: Set up;Sitting Eating/Feeding Details (indicate cue type and reason): spouse assisting with setup of breakfast upon arrival  Grooming: Wash/dry hands;Minimal assistance;Standing Grooming Details (indicate cue type and reason): for standing balance  Upper Body Bathing: Min guard;Sitting   Lower Body Bathing: Minimal assistance;Sit to/from stand   Upper Body Dressing : Min guard;Sitting   Lower Body Dressing: Minimal assistance;Sit to/from stand   Toilet Transfer: Minimal assistance;+2 for safety/equipment;Ambulation Toilet Transfer Details (indicate cue type and reason): simulated via transfer to recliner, room level mobility with HHA Toileting- Clothing Manipulation and Hygiene: Minimal assistance;Sitting/lateral lean;Sit to/from stand       Functional mobility during ADLs: Minimal assistance (HHA)       Vision Baseline Vision/History: Wears glasses Wears Glasses: At all times       Perception     Praxis      Pertinent Vitals/Pain Pain Assessment: No/denies pain     Hand Dominance Right   Extremity/Trunk Assessment Upper Extremity Assessment Upper Extremity Assessment: Generalized  weakness   Lower Extremity Assessment Lower Extremity Assessment: Defer to PT evaluation   Cervical / Trunk Assessment Cervical / Trunk Assessment: Kyphotic   Communication  Communication Communication: HOH (has R hearing aide in, still HOH)   Cognition Arousal/Alertness: Awake/alert Behavior During Therapy: WFL for tasks assessed/performed Overall Cognitive Status: Within Functional Limits for tasks assessed                                 General Comments: cognition appears funtional when the pt is able to hear questions/commands   General Comments  Pt wife present and supportive. VSS throughout. Pt has a small grey box that is part of heart monitor from TAVR that must stay within 10 ft of him at all times    Exercises     Shoulder Instructions      Home Living Family/patient expects to be discharged to:: Private residence Living Arrangements: Spouse/significant other Available Help at Discharge: Family Type of Home: Other(Comment) (townhouse) Home Access: Stairs to enter Technical brewer of Steps: 2 Entrance Stairs-Rails: Right;Left;Can reach both [place]: One level     Bathroom Shower/Tub: Occupational psychologist: Handicapped height     [place]: Hand held shower head;Shower seat - built in;Walker - 2 wheels;Cane - single point   Additional Comments: newly rennovated bathroom, no grab bars yet      Prior Functioning/Environment Level of Independence: Independent                 OT Problem List: Decreased strength;Decreased range of motion;Decreased activity tolerance;Impaired balance (sitting and/or standing);Decreased knowledge of use of DME or AE;Cardiopulmonary status limiting activity      OT Treatment/Interventions: Self-care/ADL training;Therapeutic exercise;Energy conservation;DME and/or AE instruction;Therapeutic activities;Cognitive remediation/compensation;Patient/family education;Balance training    OT Goals(Current goals can be found in the care plan section) Acute Rehab OT Goals Patient Stated Goal: return home OT Goal Formulation: With patient Time For Goal Achievement:  10/01/19 Potential to Achieve Goals: Good  OT Frequency: Min 2X/week   Barriers to D/C:            Co-evaluation PT/OT/SLP Co-Evaluation/Treatment: Yes Reason for Co-Treatment: For patient/therapist safety;Necessary to address cognition/behavior during functional activity PT goals addressed during session: Mobility/safety with mobility;Balance;Strengthening/ROM OT goals addressed during session: ADL's and self-care      AM-PAC OT "6 Clicks" Daily Activity     Outcome Measure Help from another person eating meals?: A Little Help from another person taking care of personal grooming?: A Little Help from another person toileting, which includes using toliet, bedpan, or urinal?: A Little Help from another person bathing (including washing, rinsing, drying)?: A Little Help from another person to put on and taking off regular upper body clothing?: A Little Help from another person to put on and taking off regular lower body clothing?: A Little 6 Click Score: 18   End of Session Equipment Utilized During Treatment: Gait belt Nurse Communication: Mobility status  Activity Tolerance: Patient tolerated treatment well Patient left: in chair;with call bell/phone within reach;with chair alarm set;with family/visitor present  OT Visit Diagnosis: Unsteadiness on feet (R26.81);Muscle weakness (generalized) (M62.81)                Time: 2426-8341 OT Time Calculation (min): 30 min Charges:  OT General Charges $OT Visit: 1 Visit OT Evaluation $OT Eval Moderate Complexity: Bent, OT Acute Rehabilitation Services Pager [phone] Office [phone]  Raymondo Band 09/17/2019, 12:29 PM

## 2019-09-17 NOTE — Progress Notes (Signed)
Patient ID: [name], male   DOB: [birth year], 86 y.o.   MRN: [id number]         [facility] for Infectious Disease  Date of Admission:  09/16/2019   Total days of antibiotics 2        Day 1 ceftazidime         ASSESSMENT: He has Pseudomonas bacteremia complicating a UTI by recent removal of his Foley catheter.  He recently underwent TAVR for severe aortic stenosis.  Fortunately Pseudomonas is not very likely to cause endocarditis  PLAN: 1. Continue ceftazidime pending antibiotic susceptibility results 2. Await results of TTE  Active Problems:   Sepsis ([place])   Urinary tract infection without hematuria   Pseudomonas aeruginosa infection   S/P TAVR (transcatheter aortic valve replacement)   Stroke (cerebrum) (HCC)   Paroxysmal atrial fibrillation (HCC)   Scheduled Meds: . aspirin EC  81 mg Oral Daily  . atorvastatin  20 mg Oral q1800  . Chlorhexidine Gluconate Cloth  6 each Topical Daily  . cholecalciferol  2,000 Units Oral QPM  . clopidogrel  75 mg Oral Daily  . hydrocortisone cream   Topical BID  . latanoprost  1 drop Both Eyes QHS  . pantoprazole  40 mg Oral Daily  . predniSONE  40 mg Oral Q breakfast  . sertraline  50 mg Oral QPM  . tamsulosin  0.8 mg Oral QPC supper  . timolol  1 drop Right Eye Daily   Continuous Infusions: . sodium chloride 100 mL/hr (09/16/19 1625)  . ceFEPime (MAXIPIME) IV 2 g (09/17/19 1246)   PRN Meds:.acetaminophen **OR** [DISCONTINUED] acetaminophen (TYLENOL) oral liquid 160 mg/5 mL **OR** [DISCONTINUED] acetaminophen, fluocinonide, perflutren lipid microspheres (DEFINITY) IV suspension, senna-docusate, triamcinolone cream   SUBJECTIVE: [name] is feeling better today.  He does not have much recall of the events leading up to this admission.  Review of Systems: Review of Systems  Unable to perform ROS: Mental acuity    Allergies  Allergen Reactions  . Antazoline Anaphylaxis  . Antihistamines, Chlorpheniramine-Type      Nervous, jittery  . Sulfa Antibiotics Rash    OBJECTIVE: Vitals:   09/17/19 0528 09/17/19 0600 09/17/19 0645 09/17/19 0727  BP:    (!) 107/56  Pulse:    (!) 46  Resp: (!) 23 (!) 23 19 18   Temp: (!) 101.4 F (38.6 C)  99.2 F (37.3 C) 98.6 F (37 C)  TempSrc: Oral   Oral  SpO2:    93%  Weight:      Height:       Body mass index is 30.08 kg/m.  Physical Exam Constitutional:      Comments: He is alert and in good spirits.  [name], his Firefighter, is visiting and provides most of the history.  HENT:     Head:     Comments: He is hard of hearing. Cardiovascular:     Rate and Rhythm: Normal rate and regular rhythm.     Heart sounds: No murmur heard.   Pulmonary:     Effort: Pulmonary effort is normal.     Breath sounds: Normal breath sounds.  Abdominal:     General: There is no distension.     Palpations: Abdomen is soft.  Musculoskeletal:        General: No swelling or tenderness.     Right lower leg: Edema present.     Left lower leg: Edema present.  Skin:    Findings: Bruising present.  Neurological:  General: No focal deficit present.  Psychiatric:        Mood and Affect: Mood normal.     Lab Results Lab Results  Component Value Date   WBC 26.2 (H) 09/17/2019   HGB 10.4 (L) 09/17/2019   HCT 32.7 (L) 09/17/2019   MCV 79.4 (L) 09/17/2019   PLT 73 (L) 09/17/2019    Lab Results  Component Value Date   CREATININE 1.51 (H) 09/17/2019   BUN 20 09/17/2019   NA 134 (L) 09/17/2019   K 3.6 09/17/2019   CL 100 09/17/2019   CO2 21 (L) 09/17/2019    Lab Results  Component Value Date   ALT 27 09/16/2019   AST 24 09/16/2019   ALKPHOS 51 09/16/2019   BILITOT 1.3 (H) 09/16/2019     Microbiology: Recent Results (from the past 240 hour(s))  Urine culture     Status: Abnormal (Preliminary result)   Collection Time: 09/16/19 12:30 PM   Specimen: In/Out Cath Urine  Result Value Ref Range Status   Specimen Description IN/OUT CATH URINE  Final    Special Requests NONE  Final   Culture (A)  Final    >=100,000 COLONIES/mL PSEUDOMONAS AERUGINOSA SUSCEPTIBILITIES TO FOLLOW Performed at [facility], [address], [place], [place] [address]    Report Status PENDING  Incomplete  Blood Culture (routine x 2)     Status: None (Preliminary result)   Collection Time: 09/16/19 12:58 PM   Specimen: BLOOD LEFT HAND  Result Value Ref Range Status   Specimen Description BLOOD LEFT HAND  Final   Special Requests   Final    BOTTLES DRAWN AEROBIC AND ANAEROBIC Blood Culture results may not be optimal due to an inadequate volume of blood received in culture bottles   Culture  Setup Time   Final    GRAM NEGATIVE RODS AEROBIC BOTTLE ONLY CRITICAL VALUE NOTED.  VALUE IS CONSISTENT WITH PREVIOUSLY REPORTED AND CALLED VALUE. Performed at [facility], [place] [address], [place], [place] [address]    Culture GRAM NEGATIVE RODS  Final   Report Status PENDING  Incomplete  Blood Culture (routine x 2)     Status: None (Preliminary result)   Collection Time: 09/16/19  1:00 PM   Specimen: BLOOD  Result Value Ref Range Status   Specimen Description BLOOD RIGHT ANTECUBITAL  Final   Special Requests   Final    BOTTLES DRAWN AEROBIC AND ANAEROBIC Blood Culture adequate volume   Culture  Setup Time   Final    GRAM NEGATIVE RODS AEROBIC BOTTLE ONLY Organism ID to follow CRITICAL RESULT CALLED TO, READ BACK BY AND VERIFIED WITH: Rubie Maid [id number] MLM Performed at [facility], [place] [address] [place], [place] [address]    Culture GRAM NEGATIVE RODS  Final   Report Status PENDING  Incomplete  Blood Culture ID Panel (Reflexed)     Status: Abnormal   Collection Time: 09/16/19  1:00 PM  Result Value Ref Range Status   Enterococcus species NOT DETECTED NOT DETECTED Final   Listeria monocytogenes NOT DETECTED NOT DETECTED Final   Staphylococcus species NOT DETECTED NOT DETECTED Final   Staphylococcus aureus (BCID) NOT DETECTED NOT  DETECTED Final   Streptococcus species NOT DETECTED NOT DETECTED Final   Streptococcus agalactiae NOT DETECTED NOT DETECTED Final   Streptococcus pneumoniae NOT DETECTED NOT DETECTED Final   Streptococcus pyogenes NOT DETECTED NOT DETECTED Final   Acinetobacter baumannii NOT DETECTED NOT DETECTED Final  Enterobacteriaceae species NOT DETECTED NOT DETECTED Final   Enterobacter cloacae complex NOT DETECTED NOT DETECTED Final   Escherichia coli NOT DETECTED NOT DETECTED Final   Klebsiella oxytoca NOT DETECTED NOT DETECTED Final   Klebsiella pneumoniae NOT DETECTED NOT DETECTED Final   Proteus species NOT DETECTED NOT DETECTED Final   Serratia marcescens NOT DETECTED NOT DETECTED Final   Carbapenem resistance NOT DETECTED NOT DETECTED Final   Haemophilus influenzae NOT DETECTED NOT DETECTED Final   Neisseria meningitidis NOT DETECTED NOT DETECTED Final   Pseudomonas aeruginosa DETECTED (A) NOT DETECTED Final    Comment: CRITICAL RESULT CALLED TO, READ BACK BY AND VERIFIED WITH: PHARMD T DANG [id number] MLM    Candida albicans NOT DETECTED NOT DETECTED Final   Candida glabrata NOT DETECTED NOT DETECTED Final   Candida krusei NOT DETECTED NOT DETECTED Final   Candida parapsilosis NOT DETECTED NOT DETECTED Final   Candida tropicalis NOT DETECTED NOT DETECTED Final    Comment: Performed at [facility], [place] [address], [place], [place] [address]  SARS Coronavirus 2 by RT PCR (hospital order, performed in [facility] hospital lab) Nasopharyngeal Nasopharyngeal Swab     Status: None   Collection Time: 09/16/19  1:33 PM   Specimen: Nasopharyngeal Swab  Result Value Ref Range Status   SARS Coronavirus 2 NEGATIVE NEGATIVE Final    Comment: (NOTE) SARS-CoV-2 target nucleic acids are NOT DETECTED.  The SARS-CoV-2 RNA is generally detectable in upper and lower respiratory specimens during the acute phase of infection. The lowest concentration of SARS-CoV-2 viral copies this assay can  detect is 250 copies / mL. A negative result does not preclude SARS-CoV-2 infection and should not be used as the sole basis for treatment or other patient management decisions.  A negative result may occur with improper specimen collection / handling, submission of specimen other than nasopharyngeal swab, presence of viral mutation(s) within the areas targeted by this assay, and inadequate number of viral copies (<250 copies / mL). A negative result must be combined with clinical observations, patient history, and epidemiological information.  Fact Sheet for Patients:   StrictlyIdeas.no  Fact Sheet for Healthcare Providers: BankingDealers.co.za  This test is not yet approved or  cleared by the [place] FDA and has been authorized for detection and/or diagnosis of SARS-CoV-2 by FDA under an Emergency Use Authorization (EUA).  This EUA will remain in effect (meaning this test can be used) for the duration of the COVID-19 declaration under Section 564(b)(1) of the Act, 21 U.S.C. section 360bbb-3(b)(1), unless the authorization is terminated or revoked sooner.  Performed at [facility], [place] [address], [place], [place] [address]     Michel Bickers, [place] [facility] [place] [facility] [phone] pager   [phone] cell 09/17/2019, 2:03 PM

## 2019-09-17 NOTE — Progress Notes (Signed)
   09/17/19 0300  Assess: MEWS Score  ECG Heart Rate 90  Resp (!) 26  Assess: MEWS Score  MEWS Temp 0  MEWS Systolic 0  MEWS Pulse 0  MEWS RR 2  MEWS LOC 0  MEWS Score 2  MEWS Score Color Yellow  Assess: if the MEWS score is Yellow or Red  Were vital signs taken at a resting state? Yes  Early Detection of Sepsis Score *See Row Information* Low  MEWS guidelines implemented *See Row Information* No, vital signs rechecked (Pt is occasionally tachypneic; will recheck RR)

## 2019-09-17 NOTE — Progress Notes (Signed)
PHARMACY - PHYSICIAN COMMUNICATION CRITICAL VALUE ALERT - BLOOD CULTURE IDENTIFICATION (BCID)  ROI [name] is an 86 y.o. male who presented to [facility] on 09/16/2019 with a chief complaint of AMS.  Assessment: 1 of 4 bottles thus far positive for Pseudomonas, no KPC.  UCx also growing Pseudomonas.  Afebrile, WBC up 26.2.    Name of physician (or Provider) Contacted: Dr. Thereasa Solo  Current antibiotics: Rocephin  Changes to prescribed antibiotics recommended:  Recommendations accepted by provider >> change to cefepime  Results for orders placed or performed during the hospital encounter of 09/16/19  Blood Culture ID Panel (Reflexed) (Collected: 09/16/2019  1:00 PM)  Result Value Ref Range   Enterococcus species NOT DETECTED NOT DETECTED   Listeria monocytogenes NOT DETECTED NOT DETECTED   Staphylococcus species NOT DETECTED NOT DETECTED   Staphylococcus aureus (BCID) NOT DETECTED NOT DETECTED   Streptococcus species NOT DETECTED NOT DETECTED   Streptococcus agalactiae NOT DETECTED NOT DETECTED   Streptococcus pneumoniae NOT DETECTED NOT DETECTED   Streptococcus pyogenes NOT DETECTED NOT DETECTED   Acinetobacter baumannii NOT DETECTED NOT DETECTED   Enterobacteriaceae species NOT DETECTED NOT DETECTED   Enterobacter cloacae complex NOT DETECTED NOT DETECTED   Escherichia coli NOT DETECTED NOT DETECTED   Klebsiella oxytoca NOT DETECTED NOT DETECTED   Klebsiella pneumoniae NOT DETECTED NOT DETECTED   Proteus species NOT DETECTED NOT DETECTED   Serratia marcescens NOT DETECTED NOT DETECTED   Carbapenem resistance NOT DETECTED NOT DETECTED   Haemophilus influenzae NOT DETECTED NOT DETECTED   Neisseria meningitidis NOT DETECTED NOT DETECTED   Pseudomonas aeruginosa DETECTED (A) NOT DETECTED   Candida albicans NOT DETECTED NOT DETECTED   Candida glabrata NOT DETECTED NOT DETECTED   Candida krusei NOT DETECTED NOT DETECTED   Candida parapsilosis NOT DETECTED NOT DETECTED   Candida  tropicalis NOT DETECTED NOT DETECTED    Jenifer Struve D. Mina Marble, PharmD, BCPS, [place] 09/17/2019, 11:14 AM

## 2019-09-18 ENCOUNTER — Inpatient Hospital Stay (HOSPITAL_COMMUNITY): Payer: Medicare Other

## 2019-09-18 DIAGNOSIS — A4152 Sepsis due to Pseudomonas: Principal | ICD-10-CM

## 2019-09-18 DIAGNOSIS — G9341 Metabolic encephalopathy: Secondary | ICD-10-CM

## 2019-09-18 DIAGNOSIS — Z952 Presence of prosthetic heart valve: Secondary | ICD-10-CM | POA: Diagnosis not present

## 2019-09-18 DIAGNOSIS — A498 Other bacterial infections of unspecified site: Secondary | ICD-10-CM | POA: Diagnosis not present

## 2019-09-18 DIAGNOSIS — I63512 Cerebral infarction due to unspecified occlusion or stenosis of left middle cerebral artery: Secondary | ICD-10-CM | POA: Diagnosis not present

## 2019-09-18 LAB — COMPREHENSIVE METABOLIC PANEL
ALT: 21 U/L (ref 0–44)
AST: 19 U/L (ref 15–41)
Albumin: 2.2 g/dL — ABNORMAL LOW (ref 3.5–5.0)
Alkaline Phosphatase: 59 U/L (ref 38–126)
Anion gap: 9 (ref 5–15)
BUN: 22 mg/dL (ref 8–23)
CO2: 23 mmol/L (ref 22–32)
Calcium: 7.7 mg/dL — ABNORMAL LOW (ref 8.9–10.3)
Chloride: 99 mmol/L (ref 98–111)
Creatinine, Ser: 1.23 mg/dL (ref 0.61–1.24)
GFR calc Af Amer: 58 mL/min — ABNORMAL LOW (ref >60)
GFR calc non Af Amer: 50 mL/min — ABNORMAL LOW (ref >60)
Glucose, Bld: 154 mg/dL — ABNORMAL HIGH (ref 70–99)
Potassium: 3.5 mmol/L (ref 3.5–5.1)
Sodium: 131 mmol/L — ABNORMAL LOW (ref 135–145)
Total Bilirubin: 0.5 mg/dL (ref 0.3–1.2)
Total Protein: 4.6 g/dL — ABNORMAL LOW (ref 6.5–8.1)

## 2019-09-18 LAB — CBC
HCT: 31 % — ABNORMAL LOW (ref 39.0–52.0)
Hemoglobin: 10.1 g/dL — ABNORMAL LOW (ref 13.0–17.0)
MCH: 25.4 pg — ABNORMAL LOW (ref 26.0–34.0)
MCHC: 32.6 g/dL (ref 30.0–36.0)
MCV: 78.1 fL — ABNORMAL LOW (ref 80.0–100.0)
Platelets: 57 10*3/uL — ABNORMAL LOW (ref 150–400)
RBC: 3.97 MIL/uL — ABNORMAL LOW (ref 4.22–5.81)
RDW: 18.2 % — ABNORMAL HIGH (ref 11.5–15.5)
WBC: 25.3 10*3/uL — ABNORMAL HIGH (ref 4.0–10.5)
nRBC: 0 % (ref 0.0–0.2)

## 2019-09-18 LAB — URINE CULTURE: Culture: >=100000 — AB

## 2019-09-18 MED ORDER — METOPROLOL TARTRATE 12.5 MG HALF TABLET
12.5000 mg | ORAL_TABLET | Freq: Every day | ORAL | Status: DC
  Administered 2019-09-18 – 2019-09-21 (×4): 12.5 mg via ORAL
  Filled 2019-09-18 (×4): qty 1

## 2019-09-18 MED ORDER — ROMIPLOSTIM 125 MCG ~~LOC~~ SOLR
1.0000 ug/kg | Freq: Once | SUBCUTANEOUS | Status: AC
  Administered 2019-09-18: 80 ug via SUBCUTANEOUS
  Filled 2019-09-18: qty 0.16

## 2019-09-18 MED ORDER — SODIUM CHLORIDE 0.9 % IV SOLN
2.0000 g | Freq: Two times a day (BID) | INTRAVENOUS | Status: DC
  Administered 2019-09-18 – 2019-09-21 (×7): 2 g via INTRAVENOUS
  Filled 2019-09-18 (×7): qty 2

## 2019-09-18 NOTE — Progress Notes (Signed)
Pharmacy Antibiotic Note  [name] is a 86 y.o. male admitted on 09/16/2019 with AMS, found to have Pseudomonas bacteremia and UTI.  Pharmacy has been consulted to switch from Rocephin to cefepime empirically. Urine culture sensitivities show pan sensitive Pseudomonas  Renal function improving, afebrile, WBC 25.3, LA down 1.4.  Plan: Change cefepime to 2gm IV Q12H Monitor renal fxn  Height: 5\' 4"  (162.6 cm) Weight: 79.5 kg (175 lb 4.3 oz) IBW/kg (Calculated) : 59.2  Temp (24hrs), Avg:98 F (36.7 C), Min:97.7 F (36.5 C), Max:98.5 F (36.9 C)  Recent Labs  Lab 09/13/19 1102 09/16/19 1146 09/16/19 1151 09/16/19 1300 09/16/19 1632 09/17/19 0752 09/18/19 0511  WBC 19.7* 36.1*  --   --   --  26.2* 25.3*  CREATININE  --  1.72* 1.60*  --   --  1.51* 1.23  LATICACIDVEN  --   --   --  1.7 1.4  --   --     Estimated Creatinine Clearance: 35.7 mL/min (by C-G formula based on SCr of 1.23 mg/dL).    Allergies  Allergen Reactions  . Antazoline Anaphylaxis  . Antihistamines, Chlorpheniramine-Type     Nervous, jittery  . Sulfa Antibiotics Rash   CTX x1 7/16 Cefepime 7/17 >>  7/16 UCx - Pseudomonas (pan sensitive) 7/16 BCx - Pseudomonas   Dimple Nanas, PharmD PGY-1 Acute Care Pharmacy Resident Office: [phone] 09/18/2019 12:07 PM

## 2019-09-18 NOTE — Progress Notes (Signed)
Patient ID: [name], male   DOB: 09-19-1923, 86 y.o.   MRN: [id number] Patient ID: [name], male   DOB: 1923-10-08, 86 y.o.   MRN: [id number]         [facility] for Infectious Disease  Date of Admission:  09/16/2019   Total days of antibiotics 3        Day 2 cefepime         ASSESSMENT: He has Pseudomonas bacteremia complicating a UTI.  He recently underwent TAVR for severe aortic stenosis.  Pseudomonas is not very likely to cause endocarditis.  His infection was caught early and treated appropriately.  He is improving.  I do not feel that he needs a TEE.  I discussed the case with Drs. [place] and Capron today.  PLAN: 1. Continue cefepime  Active Problems:   Sepsis ([place])   Urinary tract infection without hematuria   Pseudomonas aeruginosa infection   S/P TAVR (transcatheter aortic valve replacement)   Stroke (cerebrum) (HCC)   Paroxysmal atrial fibrillation (HCC)   Scheduled Meds: . aspirin EC  81 mg Oral Daily  . atorvastatin  20 mg Oral q1800  . Chlorhexidine Gluconate Cloth  6 each Topical Daily  . cholecalciferol  2,000 Units Oral QPM  . clopidogrel  75 mg Oral Daily  . hydrocortisone cream   Topical BID  . latanoprost  1 drop Both Eyes QHS  . metoprolol tartrate  12.5 mg Oral Daily  . pantoprazole  40 mg Oral Daily  . predniSONE  40 mg Oral Q breakfast  . sertraline  50 mg Oral QPM  . tamsulosin  0.8 mg Oral QPC supper  . timolol  1 drop Right Eye Daily   Continuous Infusions: . ceFEPime (MAXIPIME) IV     PRN Meds:.acetaminophen **OR** [DISCONTINUED] acetaminophen (TYLENOL) oral liquid 160 mg/5 mL **OR** [DISCONTINUED] acetaminophen, fluocinonide, senna-docusate, triamcinolone cream   SUBJECTIVE: [name] is feeling better.    Review of Systems: Review of Systems  Constitutional: Negative for chills, diaphoresis and fever.    Allergies  Allergen Reactions  . Antazoline Anaphylaxis  . Antihistamines, Chlorpheniramine-Type     Nervous,  jittery  . Sulfa Antibiotics Rash    OBJECTIVE: Vitals:   09/18/19 0014 09/18/19 0434 09/18/19 0732 09/18/19 1116  BP: 136/82 (!) 158/82 (!) 167/85 (!) 149/79  Pulse: 69 68 68 95  Resp: 18 18 18 18   Temp: 98.5 F (36.9 C) 98.2 F (36.8 C) 97.8 F (36.6 C) 97.8 F (36.6 C)  TempSrc: Oral Oral Axillary Oral  SpO2: 96% 93% 96% 96%  Weight:      Height:       Body mass index is 30.08 kg/m.  Physical Exam Constitutional:      Comments: He is alert and in good spirits as usual.  His wife, [name], is at the bedside.  HENT:     Head:     Comments: He is hard of hearing. Cardiovascular:     Rate and Rhythm: Normal rate and regular rhythm.     Heart sounds: No murmur heard.   Pulmonary:     Effort: Pulmonary effort is normal.     Breath sounds: Normal breath sounds.  Abdominal:     General: There is no distension.     Palpations: Abdomen is soft.  Musculoskeletal:        General: No swelling or tenderness.  Skin:    Findings: Bruising present.  Neurological:     General: No focal deficit present.  Psychiatric:        Mood and Affect: Mood normal.     Lab Results Lab Results  Component Value Date   WBC 25.3 (H) 09/18/2019   HGB 10.1 (L) 09/18/2019   HCT 31.0 (L) 09/18/2019   MCV 78.1 (L) 09/18/2019   PLT 57 (L) 09/18/2019    Lab Results  Component Value Date   CREATININE 1.23 09/18/2019   BUN 22 09/18/2019   NA 131 (L) 09/18/2019   K 3.5 09/18/2019   CL 99 09/18/2019   CO2 23 09/18/2019    Lab Results  Component Value Date   ALT 21 09/18/2019   AST 19 09/18/2019   ALKPHOS 59 09/18/2019   BILITOT 0.5 09/18/2019     Microbiology: Recent Results (from the past 240 hour(s))  Urine culture     Status: Abnormal   Collection Time: 09/16/19 12:30 PM   Specimen: In/Out Cath Urine  Result Value Ref Range Status   Specimen Description IN/OUT CATH URINE  Final   Special Requests   Final    NONE Performed at [facility], [address],  [place], [place] [address]    Culture >=100,000 COLONIES/mL PSEUDOMONAS AERUGINOSA (A)  Final   Report Status 09/18/2019 FINAL  Final   Organism ID, Bacteria PSEUDOMONAS AERUGINOSA (A)  Final      Susceptibility   Pseudomonas aeruginosa - MIC*    CEFTAZIDIME 2 SENSITIVE Sensitive     CIPROFLOXACIN 0.5 SENSITIVE Sensitive     GENTAMICIN <=1 SENSITIVE Sensitive     IMIPENEM 2 SENSITIVE Sensitive     PIP/TAZO <=4 SENSITIVE Sensitive     CEFEPIME 2 SENSITIVE Sensitive     * >=100,000 COLONIES/mL PSEUDOMONAS AERUGINOSA  Blood Culture (routine x 2)     Status: Abnormal (Preliminary result)   Collection Time: 09/16/19 12:58 PM   Specimen: BLOOD LEFT HAND  Result Value Ref Range Status   Specimen Description BLOOD LEFT HAND  Final   Special Requests   Final    BOTTLES DRAWN AEROBIC AND ANAEROBIC Blood Culture results may not be optimal due to an inadequate volume of blood received in culture bottles   Culture  Setup Time   Final    GRAM NEGATIVE RODS AEROBIC BOTTLE ONLY CRITICAL VALUE NOTED.  VALUE IS CONSISTENT WITH PREVIOUSLY REPORTED AND CALLED VALUE. Performed at [facility], [place] [address] [place], [place] [address]    Culture PSEUDOMONAS AERUGINOSA (A)  Final   Report Status PENDING  Incomplete  Blood Culture (routine x 2)     Status: Abnormal (Preliminary result)   Collection Time: 09/16/19  1:00 PM   Specimen: BLOOD  Result Value Ref Range Status   Specimen Description BLOOD RIGHT ANTECUBITAL  Final   Special Requests   Final    BOTTLES DRAWN AEROBIC AND ANAEROBIC Blood Culture adequate volume   Culture  Setup Time   Final    GRAM NEGATIVE RODS IN BOTH AEROBIC AND ANAEROBIC BOTTLES CRITICAL RESULT CALLED TO, READ BACK BY AND VERIFIED WITH: Rubie Maid [phone] MLM Performed at [facility], [place] [address] [place],  [address]    Culture PSEUDOMONAS AERUGINOSA (A)  Final   Report Status PENDING  Incomplete  Blood Culture ID Panel (Reflexed)     Status:  Abnormal   Collection Time: 09/16/19  1:00 PM  Result Value Ref Range Status   Enterococcus species NOT DETECTED NOT DETECTED Final   Listeria monocytogenes NOT DETECTED NOT DETECTED  Final   Staphylococcus species NOT DETECTED NOT DETECTED Final   Staphylococcus aureus (BCID) NOT DETECTED NOT DETECTED Final   Streptococcus species NOT DETECTED NOT DETECTED Final   Streptococcus agalactiae NOT DETECTED NOT DETECTED Final   Streptococcus pneumoniae NOT DETECTED NOT DETECTED Final   Streptococcus pyogenes NOT DETECTED NOT DETECTED Final   Acinetobacter baumannii NOT DETECTED NOT DETECTED Final   Enterobacteriaceae species NOT DETECTED NOT DETECTED Final   Enterobacter cloacae complex NOT DETECTED NOT DETECTED Final   Escherichia coli NOT DETECTED NOT DETECTED Final   Klebsiella oxytoca NOT DETECTED NOT DETECTED Final   Klebsiella pneumoniae NOT DETECTED NOT DETECTED Final   Proteus species NOT DETECTED NOT DETECTED Final   Serratia marcescens NOT DETECTED NOT DETECTED Final   Carbapenem resistance NOT DETECTED NOT DETECTED Final   Haemophilus influenzae NOT DETECTED NOT DETECTED Final   Neisseria meningitidis NOT DETECTED NOT DETECTED Final   Pseudomonas aeruginosa DETECTED (A) NOT DETECTED Final    Comment: CRITICAL RESULT CALLED TO, READ BACK BY AND VERIFIED WITH: PHARMD T DANG [id number] MLM    Candida albicans NOT DETECTED NOT DETECTED Final   Candida glabrata NOT DETECTED NOT DETECTED Final   Candida krusei NOT DETECTED NOT DETECTED Final   Candida parapsilosis NOT DETECTED NOT DETECTED Final   Candida tropicalis NOT DETECTED NOT DETECTED Final    Comment: Performed at [facility], [address] [place], [place] [address]  SARS Coronavirus 2 by RT PCR (hospital order, performed in [facility] hospital lab) Nasopharyngeal Nasopharyngeal Swab     Status: None   Collection Time: 09/16/19  1:33 PM   Specimen: Nasopharyngeal Swab  Result Value Ref Range Status   SARS  Coronavirus 2 NEGATIVE NEGATIVE Final    Comment: (NOTE) SARS-CoV-2 target nucleic acids are NOT DETECTED.  The SARS-CoV-2 RNA is generally detectable in upper and lower respiratory specimens during the acute phase of infection. The lowest concentration of SARS-CoV-2 viral copies this assay can detect is 250 copies / mL. A negative result does not preclude SARS-CoV-2 infection and should not be used as the sole basis for treatment or other patient management decisions.  A negative result may occur with improper specimen collection / handling, submission of specimen other than nasopharyngeal swab, presence of viral mutation(s) within the areas targeted by this assay, and inadequate number of viral copies (<250 copies / mL). A negative result must be combined with clinical observations, patient history, and epidemiological information.  Fact Sheet for Patients:   StrictlyIdeas.no  Fact Sheet for Healthcare Providers: BankingDealers.co.za  This test is not yet approved or  cleared by the [place] FDA and has been authorized for detection and/or diagnosis of SARS-CoV-2 by FDA under an Emergency Use Authorization (EUA).  This EUA will remain in effect (meaning this test can be used) for the duration of the COVID-19 declaration under Section 564(b)(1) of the Act, 21 U.S.C. section 360bbb-3(b)(1), unless the authorization is terminated or revoked sooner.  Performed at [facility], [place] [address], [place], [place] [address]     Michel Bickers, [place] for [facility] [place] [facility] [phone] pager   [phone] cell 09/18/2019, 1:03 PM

## 2019-09-18 NOTE — Progress Notes (Signed)
[name]  TKP:[id number] DOB: Jan 16, 1924 DOA: 09/16/2019 PCP: Douglas Battles, MD    Brief Narrative:  86 year old with a history of CAD, aortic stenosis status post TAVR 09/06/2019, RBBB, LAFB, urinary retention with frequent UTIs, chronic diastolic CHF, colon cancer, chronic thrombocytopenia, hairy cell lymphoma status post splenectomy, chronic leukocytosis, and BPH who presented to the ED with altered mental status.  The patient was doing well status post TAVR.  His postoperative aspirin had to be discontinued due to large bruises and hematoma of the right groin with a low platelet count.  The patient developed the abrupt onset of chills and urinary retention.  His wife found his temperature to be 101.  The patient visited his urologist office where a Foley catheter had to be placed and while there he suffered the acute onset of a right facial droop and acute aphasia.  He was sent to the ED via EMS.  In the ED CT confirmed a large left-sided MCA acute versus subacute CVA.  UA was also consistent with a UTI.  Exam in the ED however revealed no focal neurologic deficits.  Antimicrobials:  Rocephin 7/16 Cefepime 7/17 >  Subjective: Afebrile over last 24hrs. alert conversant and quite pleasant.  Appears to be doing extremely well.  Denies chest pain nausea vomiting abdominal pain.  Denies word finding difficulty or difficulty speaking.  Reports good appetite and intake.  I had a very lengthy discussion with the patient and his wife at the bedside updating them on our current plan of care and the work-up today.  Assessment & Plan:  Sepsis due to Pseudomonas urinary tract infection (POA)  + Pseudomonas bacteremia Pan-sensitive on testing - cont cefepime for now - sepsis physiology resolved   Acute left frontal lobe stroke - L MCA territory  Workup as per Stroke Team - MRI/MRA not able to be accomplished due to Zio patch pt is wearing - LDL 43 - A1c 6.6 - therapy suggests HHPT and OT and no need  for SLP - TTE noted EF 60-65% w/ grade 1 DD w/ no gross evidence of embolic source - ASA tx had to be held as of 7/13 due to bleeding/bruising of groin and low plt count - Plavix dosed x2 since admit but since stopped - Stroke Team dose not feel he is a candidate for DAPT or anticoag - suggestion is for ASA 81mg  QD if approved by his Hematologist   Acute kidney injury Creatinine 1.01 at time of discharge 09/09/2019 -1.72 at presentation - slowly improving presently - most c/w simple prerenal state   Recent Labs  Lab 09/16/19 1146 09/16/19 1151 09/17/19 0752 09/18/19 0511  CREATININE 1.72* 1.60* 1.51* 1.23    Aortic stenosis status post TAVR 09/06/2019 Discharged home on aspirin alone given chronic thrombocytopenia, and this had to be stopped in f/u w/ his Hematologist due to declining Plt count and bleeding into groin related to recent TAVR - valve appears safe/normally functioning per Cards team   Paroxysmal newly diagnosed atrial fibrillation Patient was sent home post TAVR with a cardiac monitor which did confirm intermittent episodes of atrial fibrillation - cardiology was following to determine arrhythmia burden to make a difficult decision regarding anticoagulation in the setting of chronic thrombocytopenia - Cards attending to this issue   Recurrent urinary retention Required discharge home with Foley catheter -subsequently removed 7/14 with recurrent urinary retention necessitating replacement of catheter 7/16 at urologist office  Chronic thrombocytopenia Followed by Dr. Benay [name] - on weekly Nplate tx (last  7/16) and chronic prednisone (since 07/25/19) -the patient and his wife confirm that he did not get his Nplate 7/16 -we will attempt to give him his dose now  Chronic leukocytosis WBC 26 at time of discharge 09/09/2019 -perhaps this is simply related to his chronic prednisone therapy  CAD Cardiac cath 5/25 noted chronically occluded mid LAD, moderate disease in the diagonals and mid  RCA -medical management  Chronic diastolic CHF No gross volume overload evident presently with patient actually dehydrated at presentation  Mild hyponatremia Monitor trend  Microcytic anemia No evidence of current ongoing blood loss to a significant extent -follow  HLD Continue usual medical therapy  HTN Blood pressure now trending upward w/ volume expansion -resume his usual beta-blocker  Elevated hemoglobin A1c A1c 6.6 at presentation - monitor CBG   DVT prophylaxis: SCDs Code Status: FULL CODE Family Communication:  Status is: Inpatient  Remains inpatient appropriate because:IV treatments appropriate due to intensity of illness or inability to take PO   Dispo: The patient is from: Home              Anticipated d/c is to: Home              Anticipated d/c date is: 3 days              Patient currently is not medically stable to d/c.    Consultants:  Stroke Team Cardiology Infectious Disease  Objective: Blood pressure (!) 167/85, pulse 68, temperature 97.8 F (36.6 C), temperature source Axillary, resp. rate 18, height 5\' 4"  (1.626 m), weight 79.5 kg, SpO2 96 %.  Intake/Output Summary (Last 24 hours) at 09/18/2019 0836 Last data filed at 09/18/2019 0733 Gross per 24 hour  Intake 1100 ml  Output 1500 ml  Net -400 ml   Filed Weights   09/16/19 1221  Weight: 79.5 kg    Examination: General: No acute respiratory distress Lungs: CTA B without wheezing Cardiovascular: RRR Abdomen: NT/ND, soft, BS positive, no rebound Extremities: No significant cyanosis, clubbing, or edema bilateral lower extremities  CBC: Recent Labs  Lab 09/13/19 1102 09/13/19 1102 09/16/19 1146 09/16/19 1146 09/16/19 1151 09/17/19 0752 09/18/19 0511  WBC 19.7*  --  36.1*  --   --  26.2* 25.3*  NEUTROABS 12.7*  --  28.3*  --   --  21.3*  --   HGB 11.2*  --  10.6*   < > 11.9* 10.4* 10.1*  HCT 34.4*   < > 33.6*   < > 35.0* 32.7* 31.0*  MCV 80.6   < > 81.6  --   --  79.4* 78.1*    PLT 56*  --  62*  --   --  73* 57*   < > = values in this interval not displayed.   Basic Metabolic Panel: Recent Labs  Lab 09/16/19 1146 09/16/19 1146 09/16/19 1151 09/17/19 0752 09/18/19 0511  NA 133*   < > 133* 134* 131*  K 4.2   < > 4.0 3.6 3.5  CL 97*   < > 94* 100 99  CO2 25  --   --  21* 23  GLUCOSE 97   < > 95 91 154*  BUN 23   < > 26* 20 22  CREATININE 1.72*   < > 1.60* 1.51* 1.23  CALCIUM 8.4*  --   --  7.8* 7.7*   < > = values in this interval not displayed.   GFR: Estimated Creatinine Clearance: 35.7 mL/min (by C-G formula based  on SCr of 1.23 mg/dL).  Liver Function Tests: Recent Labs  Lab 09/16/19 1146 09/18/19 0511  AST 24 19  ALT 27 21  ALKPHOS 51 59  BILITOT 1.3* 0.5  PROT 4.9* 4.6*  ALBUMIN 2.8* 2.2*    Coagulation Profile: Recent Labs  Lab 09/16/19 1146  INR 1.1    HbA1C: Hgb A1c MFr Bld  Date/Time Value Ref Range Status  09/17/2019 07:52 AM 6.6 (H) 4.8 - 5.6 % Final    Comment:    (NOTE) Pre diabetes:          5.7%-6.4%  Diabetes:              >6.4%  Glycemic control for   <7.0% adults with diabetes   09/02/2019 08:47 AM 6.5 (H) 4.8 - 5.6 % Final    Comment:    (NOTE) Pre diabetes:          5.7%-6.4%  Diabetes:              >6.4%  Glycemic control for   <7.0% adults with diabetes     CBG: Recent Labs  Lab 09/16/19 1146  GLUCAP 78    Recent Results (from the past 240 hour(s))  Urine culture     Status: Abnormal (Preliminary result)   Collection Time: 09/16/19 12:30 PM   Specimen: In/Out Cath Urine  Result Value Ref Range Status   Specimen Description IN/OUT CATH URINE  Final   Special Requests NONE  Final   Culture (A)  Final    >=100,000 COLONIES/mL PSEUDOMONAS AERUGINOSA SUSCEPTIBILITIES TO FOLLOW Performed at [facility], [address], [place], [place] [address]    Report Status PENDING  Incomplete  Blood Culture (routine x 2)     Status: None (Preliminary result)   Collection Time: 09/16/19  12:58 PM   Specimen: BLOOD LEFT HAND  Result Value Ref Range Status   Specimen Description BLOOD LEFT HAND  Final   Special Requests   Final    BOTTLES DRAWN AEROBIC AND ANAEROBIC Blood Culture results may not be optimal due to an inadequate volume of blood received in culture bottles   Culture  Setup Time   Final    GRAM NEGATIVE RODS AEROBIC BOTTLE ONLY CRITICAL VALUE NOTED.  VALUE IS CONSISTENT WITH PREVIOUSLY REPORTED AND CALLED VALUE. Performed at [facility], [place] [address], [place], [place] [address]    Culture GRAM NEGATIVE RODS  Final   Report Status PENDING  Incomplete  Blood Culture (routine x 2)     Status: None (Preliminary result)   Collection Time: 09/16/19  1:00 PM   Specimen: BLOOD  Result Value Ref Range Status   Specimen Description BLOOD RIGHT ANTECUBITAL  Final   Special Requests   Final    BOTTLES DRAWN AEROBIC AND ANAEROBIC Blood Culture adequate volume   Culture  Setup Time   Final    GRAM NEGATIVE RODS IN BOTH AEROBIC AND ANAEROBIC BOTTLES Organism ID to follow CRITICAL RESULT CALLED TO, READ BACK BY AND VERIFIED WITH: PHARMD T DANG [id number] MLM Performed at [facility], [place] [address], [place], [place] [address]    Culture GRAM NEGATIVE RODS  Final   Report Status PENDING  Incomplete  Blood Culture ID Panel (Reflexed)     Status: Abnormal   Collection Time: 09/16/19  1:00 PM  Result Value Ref Range Status   Enterococcus species NOT DETECTED NOT DETECTED Final   Listeria monocytogenes NOT DETECTED NOT DETECTED  Final   Staphylococcus species NOT DETECTED NOT DETECTED Final   Staphylococcus aureus (BCID) NOT DETECTED NOT DETECTED Final   Streptococcus species NOT DETECTED NOT DETECTED Final   Streptococcus agalactiae NOT DETECTED NOT DETECTED Final   Streptococcus pneumoniae NOT DETECTED NOT DETECTED Final   Streptococcus pyogenes NOT DETECTED NOT DETECTED Final   Acinetobacter baumannii NOT DETECTED NOT DETECTED Final    Enterobacteriaceae species NOT DETECTED NOT DETECTED Final   Enterobacter cloacae complex NOT DETECTED NOT DETECTED Final   Escherichia coli NOT DETECTED NOT DETECTED Final   Klebsiella oxytoca NOT DETECTED NOT DETECTED Final   Klebsiella pneumoniae NOT DETECTED NOT DETECTED Final   Proteus species NOT DETECTED NOT DETECTED Final   Serratia marcescens NOT DETECTED NOT DETECTED Final   Carbapenem resistance NOT DETECTED NOT DETECTED Final   Haemophilus influenzae NOT DETECTED NOT DETECTED Final   Neisseria meningitidis NOT DETECTED NOT DETECTED Final   Pseudomonas aeruginosa DETECTED (A) NOT DETECTED Final    Comment: CRITICAL RESULT CALLED TO, READ BACK BY AND VERIFIED WITH: PHARMD T DANG [id number] MLM    Candida albicans NOT DETECTED NOT DETECTED Final   Candida glabrata NOT DETECTED NOT DETECTED Final   Candida krusei NOT DETECTED NOT DETECTED Final   Candida parapsilosis NOT DETECTED NOT DETECTED Final   Candida tropicalis NOT DETECTED NOT DETECTED Final    Comment: Performed at [facility], [address], [place], [place] [address]  SARS Coronavirus 2 by RT PCR (hospital order, performed in [facility] hospital lab) Nasopharyngeal Nasopharyngeal Swab     Status: None   Collection Time: 09/16/19  1:33 PM   Specimen: Nasopharyngeal Swab  Result Value Ref Range Status   SARS Coronavirus 2 NEGATIVE NEGATIVE Final    Comment: (NOTE) SARS-CoV-2 target nucleic acids are NOT DETECTED.  The SARS-CoV-2 RNA is generally detectable in upper and lower respiratory specimens during the acute phase of infection. The lowest concentration of SARS-CoV-2 viral copies this assay can detect is 250 copies / mL. A negative result does not preclude SARS-CoV-2 infection and should not be used as the sole basis for treatment or other patient management decisions.  A negative result may occur with improper specimen collection / handling, submission of specimen other than nasopharyngeal swab,  presence of viral mutation(s) within the areas targeted by this assay, and inadequate number of viral copies (<250 copies / mL). A negative result must be combined with clinical observations, patient history, and epidemiological information.  Fact Sheet for Patients:   StrictlyIdeas.no  Fact Sheet for Healthcare Providers: BankingDealers.co.za  This test is not yet approved or  cleared by the [place] FDA and has been authorized for detection and/or diagnosis of SARS-CoV-2 by FDA under an Emergency Use Authorization (EUA).  This EUA will remain in effect (meaning this test can be used) for the duration of the COVID-19 declaration under Section 564(b)(1) of the Act, 21 U.S.C. section 360bbb-3(b)(1), unless the authorization is terminated or revoked sooner.  Performed at [facility], [place] [address] [place], [place] [address]      Scheduled Meds: . aspirin EC  81 mg Oral Daily  . atorvastatin  20 mg Oral q1800  . Chlorhexidine Gluconate Cloth  6 each Topical Daily  . cholecalciferol  2,000 Units Oral QPM  . clopidogrel  75 mg Oral Daily  . hydrocortisone cream   Topical BID  . latanoprost  1 drop Both Eyes QHS  . pantoprazole  40 mg Oral Daily  .  predniSONE  40 mg Oral Q breakfast  . sertraline  50 mg Oral QPM  . tamsulosin  0.8 mg Oral QPC supper  . timolol  1 drop Right Eye Daily   Continuous Infusions: . ceFEPime (MAXIPIME) IV Stopped (09/17/19 1316)     LOS: 2 days   Cherene Altes, MD [facility] Office  [phone] Pager - Text Page per Amion  If 7PM-7AM, please contact night-coverage per Amion 09/18/2019, 8:36 AM

## 2019-09-18 NOTE — Telephone Encounter (Signed)
Reviewed admission and progress.

## 2019-09-18 NOTE — Progress Notes (Signed)
STROKE TEAM PROGRESS NOTE      INTERVAL HISTORY Patient is sitting up in bed.  His wife is at the bedside.  He seems to be doing much better today with less confusion and clear mentation.  Is also afebrile.  Dr. Megan Salon from [place] feels Pseudomonas is unlikely to cause endocarditis hence TEE is not necessary.    OBJECTIVE Vitals:   09/18/19 0014 09/18/19 0434 09/18/19 0732 09/18/19 1116  BP: 136/82 (!) 158/82 (!) 167/85 (!) 149/79  Pulse: 69 68 68 95  Resp: 18 18 18 18   Temp: 98.5 F (36.9 C) 98.2 F (36.8 C) 97.8 F (36.6 C) 97.8 F (36.6 C)  TempSrc: Oral Oral Axillary Oral  SpO2: 96% 93% 96% 96%  Weight:      Height:        CBC:  Recent Labs  Lab 09/16/19 1146 09/16/19 1151 09/17/19 0752 09/18/19 0511  WBC 36.1*   < > 26.2* 25.3*  NEUTROABS 28.3*  --  21.3*  --   HGB 10.6*   < > 10.4* 10.1*  HCT 33.6*   < > 32.7* 31.0*  MCV 81.6   < > 79.4* 78.1*  PLT 62*   < > 73* 57*   < > = values in this interval not displayed.    Basic Metabolic Panel:  Recent Labs  Lab 09/17/19 0752 09/18/19 0511  NA 134* 131*  K 3.6 3.5  CL 100 99  CO2 21* 23  GLUCOSE 91 154*  BUN 20 22  CREATININE 1.51* 1.23  CALCIUM 7.8* 7.7*    Lipid Panel:     Component Value Date/Time   CHOL 96 09/17/2019 0752   TRIG 115 09/17/2019 0752   HDL 30 (L) 09/17/2019 0752   CHOLHDL 3.2 09/17/2019 0752   VLDL 23 09/17/2019 0752   LDLCALC 43 09/17/2019 0752   HgbA1c:  Lab Results  Component Value Date   HGBA1C 6.6 (H) 09/17/2019   Urine Drug Screen: No results found for: LABOPIA, COCAINSCRNUR, LABBENZ, AMPHETMU, THCU, LABBARB  Alcohol Level No results found for: [facility]  IMAGING  DG Chest Port 1 View 09/16/2019 IMPRESSION:  1. No acute abnormality.  2. Stable mild cardiomegaly and left lung mass.  CT HEAD CODE STROKE WO CONTRAST 09/16/2019 IMPRESSION:  1. Acute/early subacute ischemic infarction within the left MCA vascular territory within the mid left frontal lobe and left frontal  operculum. ASPECTS is 8. Mild regional mass effect. No midline shift. No hemorrhagic conversion.  2. Background mild generalized parenchymal atrophy and chronic small vessel ischemic disease  3. Mild ethmoid sinus mucosal thickening.   Transthoracic Echocardiogram  Normal left ventricular ejection fraction of 60 to 65%. Limited visualization of the [place]  aortic valve prosthesis without definite thrombus or vegetation visualized.   Bilateral Carotid Dopplers 07/09/19 Summary:  Right Carotid: Velocities in the right ICA are consistent with a 1-39% stenosis.  Left Carotid: Velocities in the left ICA are consistent with a 1-39%  stenosis.  Vertebrals: Bilateral vertebral arteries demonstrate antegrade flow.   ECG - SR rate 77 BPM. (See cardiology reading for complete details)   PHYSICAL EXAM Blood pressure (!) 149/79, pulse 95, temperature 97.8 F (36.6 C), temperature source Oral, resp. rate 18, height 5\' 4"  (1.626 m), weight 79.5 kg, SpO2 96 %. Pleasant elderly Caucasian male not in distress. . Afebrile. Head is nontraumatic. Neck is supple without bruit.    Cardiac exam no murmur or gallop. Lungs are clear to auscultation. Distal pulses are well  felt.  Neurological Exam ;  Awake  Alert oriented x 3. Diminished attention, registration and recall. Normal speech and language.eye movements full without nystagmus.fundi were not visualized. Vision acuity and fields appear normal. Hearing is normal. Palatal movements are normal. Mild right lower facial asymmetry when he smiles.. Tongue midline. Normal strength, tone, reflexes and coordination. Normal sensation. Gait deferred.       ASSESSMENT/PLAN [name] is a 86 y.o. male with history of coronary artery disease (stent), Htn, Leukemia, anemia, malnutrition, colon cancer, degenerative joint disease, diminished auditory acuity bilaterally, leukemia, aortic stenosis status post TAVR on 09/06/2019, who had been not feeling well  for at least the past 2 to 3 days, most of the problems have been with his urinary catheter and urinary retention - suspected UTI, presenting with sudden onset of difficulty with his speech as well as right facial droop, fever 101 and mildly low BP. He did not receive IV t-PA due to late presentation (>4.5 hours from time of onset) and mild deficits.  Stroke: Acute/early subacute ischemic infarction within the left MCA vascular territory within the mid left frontal lobe and left frontal operculum - embolic - likely atrial fibrillation.  Resultant no deficits  Code Stroke CT Head - Acute/early subacute ischemic infarction within the left MCA vascular territory within the mid left frontal lobe and left frontal operculum. ASPECTS is 8. Mild regional mass effect. No midline shift. No hemorrhagic conversion. Background mild generalized parenchymal atrophy and chronic small vessel ischemic disease   CT head -subacute left MCA branch infarct  MRI head -pending  MRA head -pending  CTA H&N -pending  Carotid Doppler - 07/09/19 - normal  2D Echo -normal ejection fraction. Normal functioning aortic valve s/p TAVR  Lacey Jensen Virus 2 - negative  LDL - 43  HgbA1c - 6.6  UDS - not ordered  VTE prophylaxis - SCDs Diet  Diet Order            Diet heart healthy/carb modified Room service appropriate? Yes; Fluid consistency: Thin  Diet effective now                 No antithrombotic prior to admission, now on No antithrombotic  Ongoing aggressive stroke risk factor management  Therapy recommendations:  HH PT and OT recommended  Disposition:  Pending  Hypertension  Home BP meds: Lopressor  Current BP meds: none   BP mildly low . Permissive hypertension (OK if < 220/120) but gradually normalize in 5-7 days  . Long-term BP goal normotensive  Hyperlipidemia  Home Lipid lowering medication: Lipitor 20 mg daily  LDL 43, goal < 70  Current lipid lowering medication: Lipitor 20 mg  daily   Continue statin at discharge  Diabetes  Home diabetic meds: none   Current diabetic meds: none  HgbA1c 6.6, goal < 7.0 Recent Labs    09/16/19 1146  GLUCAP 78    Other Stroke Risk Factors  Advanced age  Former cigarette smoker - quit  ETOH use, advised to drink no more than 1 alcoholic beverage per day.  Obesity, Body mass index is 30.08 kg/m., recommend weight loss, diet and exercise as appropriate   Family hx stroke (mother)   Coronary artery disease  Recently diagnosed atrial fibrillation  CHF  Other Active Problems  Code status - Full Code  Fever - 101.4 -> 98.6->97.8  Leukocytosis - 36.1->26.2->25.3  Leukemia   Anemia - Hgb - 10.4->10.1  Thrombocytopenia - 73->57 - Dr. Benay Spice had stopped ASA  due to decrease platelets 09/13/19 - ASA 81 mg daily restarted 7/17  UTI / Bacteremia -> Rocephin-> Maxipime - ID following  Bradycardia - 40's    Hyponatremia - 134 CKD - stage 3b - creatinine - 1.72->1.60->1.51->1.23  Atrial fibrillation (PAF) - Cardiology onboard  Known lung mass   Hypocalcemia - 7.8->7.7  Hospital day # 2  Patient presented with some slurred speech and right facial droop and CT scan showed subacute left MCA branch infarct etiology unclear possibly embolic from A. Fib though I doubt endocarditis of aortic valve given positive urine and blood cultures as Pseudomonas rarely causes endocarditis. May not be able to obtain MRI as patient is wearing a Zio patch for A. fib. Recommend aspirin 81 mg daily patient is not a good candidate for dual antiplatelet therapy or anticoagulation given his thrombocytopenia. May need to consider doing TEE to look at the aortic valve more carefully to decide on duration of antibiotics if ID feels strongly about it. Discussed with Dr. Thereasa Solo.  Stroke team will sign off.  Kindly call for questions  Antony Contras, MD To contact Stroke Continuity provider, please refer to [email or web address] After hours, contact  General Neurology

## 2019-09-18 NOTE — Progress Notes (Signed)
Progress Note  Patient Name: [name] Date of Encounter: 09/18/2019  [facility] Cardiologist: Sinclair Grooms, MD   Subjective   Feeling much better today. Fever is down.  No dyspnea. No chest pain  Inpatient Medications    Scheduled Meds: . aspirin EC  81 mg Oral Daily  . atorvastatin  20 mg Oral q1800  . Chlorhexidine Gluconate Cloth  6 each Topical Daily  . cholecalciferol  2,000 Units Oral QPM  . clopidogrel  75 mg Oral Daily  . hydrocortisone cream   Topical BID  . latanoprost  1 drop Both Eyes QHS  . metoprolol tartrate  12.5 mg Oral Daily  . pantoprazole  40 mg Oral Daily  . predniSONE  40 mg Oral Q breakfast  . sertraline  50 mg Oral QPM  . tamsulosin  0.8 mg Oral QPC supper  . timolol  1 drop Right Eye Daily   Continuous Infusions: . ceFEPime (MAXIPIME) IV Stopped (09/17/19 1316)   PRN Meds: acetaminophen **OR** [DISCONTINUED] acetaminophen (TYLENOL) oral liquid 160 mg/5 mL **OR** [DISCONTINUED] acetaminophen, fluocinonide, senna-docusate, triamcinolone cream   Vital Signs    Vitals:   09/18/19 0014 09/18/19 0434 09/18/19 0732 09/18/19 1116  BP: 136/82 (!) 158/82 (!) 167/85 (!) 149/79  Pulse: 69 68 68 95  Resp: 18 18 18 18   Temp: 98.5 F (36.9 C) 98.2 F (36.8 C) 97.8 F (36.6 C) 97.8 F (36.6 C)  TempSrc: Oral Oral Axillary Oral  SpO2: 96% 93% 96% 96%  Weight:      Height:        Intake/Output Summary (Last 24 hours) at 09/18/2019 1153 Last data filed at 09/18/2019 0733 Gross per 24 hour  Intake 1100 ml  Output 1500 ml  Net -400 ml   Last 3 Weights 09/16/2019 09/14/2019 09/13/2019  Weight (lbs) 175 lb 4.3 oz 173 lb 12.8 oz 173 lb 8 oz  Weight (kg) 79.5 kg 78.835 kg 78.699 kg      Telemetry    NSR. No Afib  - Personally Reviewed  ECG    NSR with LAFB, RBBB - Personally Reviewed  Physical Exam   GEN: Elderly WM No acute distress.   Neck: No JVD Cardiac: RRR, soft SEM RUSB, no rub or gallop Respiratory: Clear to  auscultation bilaterally. GI: Soft, nontender, non-distended  MS: 1+ edema; No deformity. Neuro:  Nonfocal  Psych: Normal affect   Labs    High Sensitivity Troponin:  No results for input(s): TROPONINIHS in the last 720 hours.    Chemistry Recent Labs  Lab 09/16/19 1146 09/16/19 1146 09/16/19 1151 09/17/19 0752 09/18/19 0511  NA 133*   < > 133* 134* 131*  K 4.2   < > 4.0 3.6 3.5  CL 97*   < > 94* 100 99  CO2 25  --   --  21* 23  GLUCOSE 97   < > 95 91 154*  BUN 23   < > 26* 20 22  CREATININE 1.72*   < > 1.60* 1.51* 1.23  CALCIUM 8.4*  --   --  7.8* 7.7*  PROT 4.9*  --   --   --  4.6*  ALBUMIN 2.8*  --   --   --  2.2*  AST 24  --   --   --  19  ALT 27  --   --   --  21  ALKPHOS 51  --   --   --  59  BILITOT  1.3*  --   --   --  0.5  GFRNONAA 34*  --   --  39* 50*  GFRAA 39*  --   --  45* 58*  ANIONGAP 11  --   --  13 9   < > = values in this interval not displayed.     Hematology Recent Labs  Lab 09/16/19 1146 09/16/19 1146 09/16/19 1151 09/17/19 0752 09/18/19 0511  WBC 36.1*  --   --  26.2* 25.3*  RBC 4.12*  --   --  4.12* 3.97*  HGB 10.6*   < > 11.9* 10.4* 10.1*  HCT 33.6*   < > 35.0* 32.7* 31.0*  MCV 81.6  --   --  79.4* 78.1*  MCH 25.7*  --   --  25.2* 25.4*  MCHC 31.5  --   --  31.8 32.6  RDW 18.3*  --   --  18.6* 18.2*  PLT 62*  --   --  73* 57*   < > = values in this interval not displayed.    BNPNo results for input(s): BNP, PROBNP in the last 168 hours.   DDimer No results for input(s): DDIMER in the last 168 hours.   Radiology    DG Chest Port 1 View  Result Date: 09/16/2019 CLINICAL DATA:  Postop fever. EXAM: PORTABLE CHEST 1 VIEW COMPARISON:  Radiograph 09/06/2019, CT 07/29/2019 FINDINGS: Post TAVR. Previous right internal jugular pacer is been removed. Battery pack projects over the left chest wall. Stable cardiomegaly. Aortic atherosclerosis. Central left lung mass is unchanged by radiograph. No evidence of acute airspace disease. No  pleural fluid. No pneumothorax. No pulmonary edema. IMPRESSION: 1. No acute abnormality. 2. Stable mild cardiomegaly and left lung mass. Electronically Signed   By: Keith Rake M.D.   On: 09/16/2019 14:02   ECHOCARDIOGRAM COMPLETE  Result Date: 09/17/2019    ECHOCARDIOGRAM REPORT   Patient Name:   [name] Date of Exam: 09/17/2019 Medical Rec #:  [id number]     Height:       64.0 in Accession #:    [id number]    Weight:       175.3 lb Date of Birth:  16-May-1923    BSA:          1.850 m Patient Age:    86 years      BP:           107/56 mmHg Patient Gender: M             HR:           63 bpm. Exam Location:  Inpatient Procedure: 2D Echo, Cardiac Doppler, Color Doppler and Intracardiac            Opacification Agent Indications:    Stroke 434.91 / I163.9  History:        Patient has prior history of Echocardiogram examinations, most                 recent 09/07/2019. CAD, Stroke; Risk Factors:Hypertension,                 Dyslipidemia and Former Smoker. GERD.                 Aortic Valve: 26 mm Edwards Sapien prosthetic, stented (TAVR)                 valve is present in the aortic position. Procedure Date:  09/06/2019.  Sonographer:    Vickie Epley RDCS Referring Phys: [id number] [place]  1. Left ventricular ejection fraction, by estimation, is 60 to 65%. The left ventricle has normal function. The left ventricle has no regional wall motion abnormalities. Left ventricular diastolic parameters are consistent with Grade I diastolic dysfunction (impaired relaxation). Elevated left atrial pressure.  2. Right ventricular systolic function is normal. The right ventricular size is normal. There is normal pulmonary artery systolic pressure.  3. The mitral valve is normal in structure. Trivial mitral valve regurgitation. No evidence of mitral stenosis.  4. Edwards Sapien 3 THV size 26 mm, model #[id number] is in the AV position. Mildly elevated mean gradient of 25 mmHg based on published  normals, has signifcantly increased from prior study 09/07/19. The LVOT/ AV VTI ratio has also decreased. Limited morphological visualization of prosthetic valve in general with TTE, no gross visualized thrombus or vegetation. Given presentation with bacteremia, CVA, and increased gradient across prosthetic valve would recommend TEE for better visualization. . The aortic valve has been repaired/replaced. Aortic valve regurgitation is not visualized. There is a 26 mm Edwards Sapien prosthetic (TAVR) valve present in the aortic position. Procedure Date: 09/06/2019.  5. The inferior vena cava is normal in size with greater than 50% respiratory variability, suggesting right atrial pressure of 3 mmHg. FINDINGS  Left Ventricle: Left ventricular ejection fraction, by estimation, is 60 to 65%. The left ventricle has normal function. The left ventricle has no regional wall motion abnormalities. Definity contrast agent was given IV to delineate the left ventricular  endocardial borders. The left ventricular internal cavity size was normal in size. There is no left ventricular hypertrophy. Left ventricular diastolic parameters are consistent with Grade I diastolic dysfunction (impaired relaxation). Elevated left atrial pressure. Right Ventricle: The right ventricular size is normal. No increase in right ventricular wall thickness. Right ventricular systolic function is normal. There is normal pulmonary artery systolic pressure. The tricuspid regurgitant velocity is 1.85 m/s, and  with an assumed right atrial pressure of 3 mmHg, the estimated right ventricular systolic pressure is 62.7 mmHg. Left Atrium: Left atrial size was normal in size. Right Atrium: Right atrial size was normal in size. Pericardium: There is no evidence of pericardial effusion. Mitral Valve: The mitral valve is normal in structure. There is mild thickening of the mitral valve leaflet(s). There is mild calcification of the mitral valve leaflet(s). Mild mitral  annular calcification. Trivial mitral valve regurgitation. No evidence  of mitral valve stenosis. Tricuspid Valve: The tricuspid valve is normal in structure. Tricuspid valve regurgitation is mild . No evidence of tricuspid stenosis. Aortic Valve: Edwards Sapien 3 THV size 26 mm, model #[id number] is in the AV position. Mildly elevated mean gradient of 25 mmHg based on published normals, has signifcantly increased from prior study 09/07/19. The LVOT/ AV VTI ratio has also decreased. Limited morphological visualization of prosthetic valve in general with TTE, no gross visualized thrombus or vegetation. Given presentation with bacteremia, CVA, and increased gradient across prosthetic valve would recommend TEE for better visualization.  The aortic valve has been repaired/replaced. Aortic valve regurgitation is not visualized. Aortic valve mean gradient measures 25.2 mmHg. Aortic valve peak gradient measures 41.5 mmHg. Aortic valve area, by VTI measures 1.88 cm. There is a 26 mm Edwards Sapien prosthetic, stented (TAVR) valve present in the aortic position. Procedure Date: 09/06/2019. Pulmonic Valve: The pulmonic valve was not well visualized. Pulmonic valve regurgitation is mild. No evidence of pulmonic stenosis. Aorta: The aortic  root is normal in size and structure. Pulmonary Artery: No evidence of pulmonary HTN, PASP is 27 mmHg. Venous: The inferior vena cava is normal in size with greater than 50% respiratory variability, suggesting right atrial pressure of 3 mmHg. IAS/Shunts: The interatrial septum appears to be lipomatous. No atrial level shunt detected by color flow Doppler.  LEFT VENTRICLE PLAX 2D LVIDd:         3.90 cm      Diastology LVIDs:         2.90 cm      LV e' lateral:   5.27 cm/s LV PW:         0.90 cm      LV E/e' lateral: 19.9 LV IVS:        0.90 cm      LV e' medial:    3.57 cm/s LVOT diam:     2.60 cm      LV E/e' medial:  29.4 LV SV:         132 LV SV Index:   71 LVOT Area:     5.31 cm  LV Volumes  (MOD) LV vol d, MOD A2C: 91.4 ml LV vol d, MOD A4C: 116.0 ml LV vol s, MOD A2C: 47.8 ml LV vol s, MOD A4C: 44.2 ml LV SV MOD A2C:     43.6 ml LV SV MOD A4C:     116.0 ml LV SV MOD BP:      61.3 ml RIGHT VENTRICLE RV S prime:     15.70 cm/s TAPSE (M-mode): 2.3 cm LEFT ATRIUM             Index       RIGHT ATRIUM           Index LA diam:        3.40 cm 1.84 cm/m  RA Area:     10.90 cm LA Vol (A2C):   34.7 ml 18.76 ml/m RA Volume:   22.20 ml  12.00 ml/m LA Vol (A4C):   45.3 ml 24.49 ml/m LA Biplane Vol: 41.2 ml 22.27 ml/m  AORTIC VALVE AV Area (Vmax):    1.86 cm AV Area (Vmean):   1.76 cm AV Area (VTI):     1.88 cm AV Vmax:           321.96 cm/s AV Vmean:          237.994 cm/s AV VTI:            0.702 m AV Peak Grad:      41.5 mmHg AV Mean Grad:      25.2 mmHg LVOT Vmax:         113.00 cm/s LVOT Vmean:        78.700 cm/s LVOT VTI:          0.249 m LVOT/AV VTI ratio: 0.35  AORTA Ao Root diam: 3.00 cm MITRAL VALVE                TRICUSPID VALVE MV Area (PHT): 2.23 cm     TR Peak grad:   13.7 mmHg MV Decel Time: 340 msec     TR Vmax:        185.00 cm/s MV E velocity: 105.00 cm/s MV A velocity: 129.00 cm/s  SHUNTS MV E/A ratio:  0.81         Systemic VTI:  0.25 m  Systemic Diam: 2.60 cm Carlyle Dolly MD Electronically signed by Carlyle Dolly MD Signature Date/Time: 09/17/2019/2:05:41 PM    Final     Cardiac Studies   Echo: 09/12/19: IMPRESSIONS    1. Day 1 post TAVR with normal transaortic gradients with peak/mean 18/11  mmHg. Trivial paravalvular leak.  2. Left ventricular ejection fraction, by estimation, is 65 to 70%. The  left ventricle has normal function. The left ventricle has no regional  wall motion abnormalities. Left ventricular diastolic function could not  be evaluated.  3. Right ventricular systolic function is normal. The right ventricular  size is normal. There is normal pulmonary artery systolic pressure.  4. The mitral valve is normal in structure.  Trivial mitral valve  regurgitation. No evidence of mitral stenosis.  5. The aortic valve has been repaired/replaced. Aortic valve  regurgitation is not visualized. No aortic stenosis is present. There is a  26 mm Ultra, stented (TAVR) valve present in the aortic position.  Procedure Date: 09/06/2019. Echo findings are  consistent with normal structure and function of the aortic valve  prosthesis. Aortic valve mean gradient measures 11.0 mmHg.  6. The inferior vena cava is normal in size with greater than 50%  respiratory variability, suggesting right atrial pressure of 3 mmHg.   FINDINGS  Left Ventricle: Left ventricular ejection fraction, by estimation, is 65  to 70%. The left ventricle has normal function. The left ventricle has no  regional wall motion abnormalities. Definity contrast agent was given IV  to delineate the left ventricular  endocardial borders. The left ventricular internal cavity size was normal  in size. There is no left ventricular hypertrophy. Left ventricular  diastolic function could not be evaluated.   Right Ventricle: The right ventricular size is normal. No increase in  right ventricular wall thickness. Right ventricular systolic function is  normal. There is normal pulmonary artery systolic pressure.   Left Atrium: Left atrial size was normal in size.   Right Atrium: Right atrial size was normal in size.   Pericardium: Trivial pericardial effusion is present. There is no evidence  of cardiac tamponade.   Mitral Valve: The mitral valve is normal in structure. Normal mobility of  the mitral valve leaflets. Trivial mitral valve regurgitation. No evidence  of mitral valve stenosis.   Tricuspid Valve: The tricuspid valve is normal in structure. Tricuspid  valve regurgitation is trivial. No evidence of tricuspid stenosis.   Aortic Valve: The aortic valve has been repaired/replaced. Aortic valve  regurgitation is not visualized. No aortic stenosis is  present. Aortic  valve mean gradient measures 11.0 mmHg. Aortic valve peak gradient  measures 18.2 mmHg. Aortic valve area, by  VTI measures 3.75 cm. There is a 26 mm Ultra, stented (TAVR) valve  present in the aortic position. Procedure Date: 09/06/2019. Echo findings  are consistent with normal structure and function of the aortic valve  prosthesis.   Pulmonic Valve: The pulmonic valve was normal in structure. Pulmonic valve  regurgitation is not visualized. No evidence of pulmonic stenosis.   Aorta: The aortic root is normal in size and structure.   Venous: The inferior vena cava is normal in size with greater than 50%  respiratory variability, suggesting right atrial pressure of 3 mmHg.   IAS/Shunts: No atrial level shunt detected by color flow Doppler.    Patient Profile     86 y.o. male with CAD, severe AS s/p TAVR on 09/06/19, RBBB, chronic thrombocytopenia and urinary retention who was discharged home last week  after TAVR with a foley in place for urinary retention. He had the foley catheter removed two days ago in the urology office and has not voided well since then. His wife notes increasing LE edema over the past few days.  He went into the urology office today for follow up after his wife reported fevers at home. While in the urology office, he developed facial droop and aphasia and was brought to [facility] via EMS. He was seen by Neurology and not felt to a candidate for tPA. Stroke like symptoms resolved. Head CT with evidence of acute/subacute infarct in the left frontal lobe. Of note, he was discharged on ASA alone following his TAVR given history of profound thrombocytopenia which is followed closely in hematology by Dr. Benay Spice. He was wearing a cardiac monitor post TAVR and had atrial fibrillation noted. The valve team discussed this and elected to wait until we saw his total AF burden to consider whether or not anti-coagulation should be discussed with Hematology given his chronic  issues.   Assessment & Plan    1. Urosepsis with bacteremia due to Pseudomonas infection. Fever is down. WBC improved to 25 K. On appropriate antibiotics and ID is following. Low risk for endocarditis. I reviewed Echo. There is some increase in gradient compared to post procedure but I suspect this is mainly related to increased Cardiac output with his septicemia. I don't feel a TEE is warranted at this point.  2. Recent left frontal lobe CVA. ? Complication from TAVR versus Afib. Not a good candidate for anticoagulation due to thrombocytopenia. plts 57K.  Neurologically intact today 3. Severe AS s/p recent TAVR.  4. Paroxysmal Afib. Low burden. In NSR here.  5. Chronic diastolic CHF. Given lasix yesterday. Will hold with AKI 6. CAD stable by cath in May 2021.  7. HDL on statin 8. HTN BP soft - metoprolol on hold.  9. AKI. Due to sepsis. Creatinine 1.72 yesterday. Now improving. Would hold lasix for now.     For questions or updates, please contact [place] Please consult [email or web address] for contact info under        Signed, Melenie Minniear [place], MD  09/18/2019, 11:53 AM

## 2019-09-19 ENCOUNTER — Encounter (HOSPITAL_COMMUNITY): Payer: Medicare Other

## 2019-09-19 ENCOUNTER — Inpatient Hospital Stay (HOSPITAL_COMMUNITY): Payer: Medicare Other

## 2019-09-19 DIAGNOSIS — I63012 Cerebral infarction due to thrombosis of left vertebral artery: Secondary | ICD-10-CM

## 2019-09-19 DIAGNOSIS — I639 Cerebral infarction, unspecified: Secondary | ICD-10-CM

## 2019-09-19 DIAGNOSIS — I480 Paroxysmal atrial fibrillation: Secondary | ICD-10-CM

## 2019-09-19 DIAGNOSIS — N3000 Acute cystitis without hematuria: Secondary | ICD-10-CM

## 2019-09-19 DIAGNOSIS — A498 Other bacterial infections of unspecified site: Secondary | ICD-10-CM | POA: Diagnosis not present

## 2019-09-19 DIAGNOSIS — A021 Salmonella sepsis: Secondary | ICD-10-CM

## 2019-09-19 DIAGNOSIS — D65 Disseminated intravascular coagulation [defibrination syndrome]: Secondary | ICD-10-CM

## 2019-09-19 DIAGNOSIS — Z952 Presence of prosthetic heart valve: Secondary | ICD-10-CM | POA: Diagnosis not present

## 2019-09-19 LAB — CBC
HCT: 33.1 % — ABNORMAL LOW (ref 39.0–52.0)
Hemoglobin: 10.5 g/dL — ABNORMAL LOW (ref 13.0–17.0)
MCH: 24.8 pg — ABNORMAL LOW (ref 26.0–34.0)
MCHC: 31.7 g/dL (ref 30.0–36.0)
MCV: 78.3 fL — ABNORMAL LOW (ref 80.0–100.0)
Platelets: 60 10*3/uL — ABNORMAL LOW (ref 150–400)
RBC: 4.23 MIL/uL (ref 4.22–5.81)
RDW: 18.2 % — ABNORMAL HIGH (ref 11.5–15.5)
WBC: 19.9 10*3/uL — ABNORMAL HIGH (ref 4.0–10.5)
nRBC: 0.3 % — ABNORMAL HIGH (ref 0.0–0.2)

## 2019-09-19 LAB — CULTURE, BLOOD (ROUTINE X 2): Special Requests: ADEQUATE

## 2019-09-19 LAB — BASIC METABOLIC PANEL
Anion gap: 8 (ref 5–15)
BUN: 19 mg/dL (ref 8–23)
CO2: 23 mmol/L (ref 22–32)
Calcium: 8.3 mg/dL — ABNORMAL LOW (ref 8.9–10.3)
Chloride: 102 mmol/L (ref 98–111)
Creatinine, Ser: 1.08 mg/dL (ref 0.61–1.24)
GFR calc Af Amer: >60 mL/min (ref >60)
GFR calc non Af Amer: 59 mL/min — ABNORMAL LOW (ref >60)
Glucose, Bld: 169 mg/dL — ABNORMAL HIGH (ref 70–99)
Potassium: 3.4 mmol/L — ABNORMAL LOW (ref 3.5–5.1)
Sodium: 133 mmol/L — ABNORMAL LOW (ref 135–145)

## 2019-09-19 MED ORDER — HYDRALAZINE HCL 20 MG/ML IJ SOLN: 10.0000 mg | Freq: Four times a day (QID) | INTRAMUSCULAR | Status: DC | PRN

## 2019-09-19 MED ORDER — STROKE: EARLY STAGES OF RECOVERY BOOK
Freq: Once | Status: AC
  Filled 2019-09-19: qty 1

## 2019-09-19 MED ORDER — ASPIRIN EC 81 MG PO TBEC
81.0000 mg | DELAYED_RELEASE_TABLET | Freq: Every day | ORAL | Status: DC
  Administered 2019-09-19 – 2019-09-21 (×3): 81 mg via ORAL
  Filled 2019-09-19 (×3): qty 1

## 2019-09-19 MED ORDER — POTASSIUM CHLORIDE CRYS ER 20 MEQ PO TBCR
40.0000 meq | EXTENDED_RELEASE_TABLET | Freq: Once | ORAL | Status: AC
  Administered 2019-09-19: 40 meq via ORAL
  Filled 2019-09-19: qty 2

## 2019-09-19 NOTE — Progress Notes (Signed)
Subjective: No new complaints   Antibiotics:  Anti-infectives (From admission, onward)   Start     Dose/Rate Route Frequency Ordered Stop   09/18/19 1300  ceFEPIme (MAXIPIME) 2 g in sodium chloride 0.9 % 100 mL IVPB     Discontinue     2 g 200 mL/hr over 30 Minutes Intravenous Every 12 hours 09/18/19 1209     09/17/19 1400  cefTRIAXone (ROCEPHIN) 2 g in sodium chloride 0.9 % 100 mL IVPB  Status:  Discontinued        2 g 200 mL/hr over 30 Minutes Intravenous Every 24 hours 09/16/19 1424 09/17/19 1120   09/17/19 1200  ceFEPIme (MAXIPIME) 2 g in sodium chloride 0.9 % 100 mL IVPB  Status:  Discontinued        2 g 200 mL/hr over 30 Minutes Intravenous Every 24 hours 09/17/19 1120 09/18/19 1209   09/16/19 1430  cefTRIAXone (ROCEPHIN) 1 g in sodium chloride 0.9 % 100 mL IVPB       Note to Pharmacy: Make total of 2 gm for today to cover putative endocarditis   1 g 200 mL/hr over 30 Minutes Intravenous  Once 09/16/19 1424 09/16/19 1751   09/16/19 1345  cefTRIAXone (ROCEPHIN) 1 g in sodium chloride 0.9 % 100 mL IVPB        1 g 200 mL/hr over 30 Minutes Intravenous  Once 09/16/19 1343 09/16/19 1444      Medications: Scheduled Meds: . aspirin EC  81 mg Oral Daily  . atorvastatin  20 mg Oral q1800  . Chlorhexidine Gluconate Cloth  6 each Topical Daily  . cholecalciferol  2,000 Units Oral QPM  . hydrocortisone cream   Topical BID  . latanoprost  1 drop Both Eyes QHS  . metoprolol tartrate  12.5 mg Oral Daily  . pantoprazole  40 mg Oral Daily  . predniSONE  40 mg Oral Q breakfast  . sertraline  50 mg Oral QPM  . tamsulosin  0.8 mg Oral QPC supper  . timolol  1 drop Right Eye Daily   Continuous Infusions: . ceFEPime (MAXIPIME) IV 2 g (09/19/19 1319)   PRN Meds:.acetaminophen **OR** [DISCONTINUED] acetaminophen (TYLENOL) oral liquid 160 mg/5 mL **OR** [DISCONTINUED] acetaminophen, fluocinonide, hydrALAZINE, senna-docusate, triamcinolone cream    Objective: Weight change:    Intake/Output Summary (Last 24 hours) at 09/19/2019 1444 Last data filed at 09/19/2019 0905 Gross per 24 hour  Intake 240 ml  Output 2000 ml  Net -1760 ml   Blood pressure 125/75, pulse (!) 54, temperature 98.3 F (36.8 C), temperature source Oral, resp. rate 16, height 5\' 4"  (1.626 m), weight 79.5 kg, SpO2 97 %. Temp:  [97.5 F (36.4 C)-98.6 F (37 C)] 98.3 F (36.8 C) (07/19 1302) Pulse Rate:  [40-68] 54 (07/19 1302) Resp:  [16-18] 16 (07/19 1302) BP: (125-175)/(64-89) 125/75 (07/19 1302) SpO2:  [94 %-97 %] 97 % (07/19 1302)  Physical Exam: General: Sleepy but arousable and alert and oriented. HEENT: anicteric sclera, EOMI CVS regular rate, normal  Chest: , no wheezing, no respiratory distress Abdomen: soft non-distended,  Neuro: nonfocal  CBC:    BMET Recent Labs    09/18/19 0511 09/19/19 0838  NA 131* 133*  K 3.5 3.4*  CL 99 102  CO2 23 23  GLUCOSE 154* 169*  BUN 22 19  CREATININE 1.23 1.08  CALCIUM 7.7* 8.3*     Liver Panel  Recent Labs    09/18/19 0511  PROT 4.6*  ALBUMIN 2.2*  AST 19  ALT 21  ALKPHOS 59  BILITOT 0.5       Sedimentation Rate No results for input(s): ESRSEDRATE in the last 72 hours. C-Reactive Protein No results for input(s): CRP in the last 72 hours.  Micro Results: Recent Results (from the past 720 hour(s))  SARS CORONAVIRUS 2 (TAT 6-24 HRS) Nasopharyngeal Nasopharyngeal Swab     Status: None   Collection Time: 09/03/19 11:18 AM   Specimen: Nasopharyngeal Swab  Result Value Ref Range Status   SARS Coronavirus 2 NEGATIVE NEGATIVE Final    Comment: (NOTE) SARS-CoV-2 target nucleic acids are NOT DETECTED.  The SARS-CoV-2 RNA is generally detectable in upper and lower respiratory specimens during the acute phase of infection. Negative results do not preclude SARS-CoV-2 infection, do not rule out co-infections with other pathogens, and should not be used as the sole basis for treatment or other patient management  decisions. Negative results must be combined with clinical observations, patient history, and epidemiological information. The expected result is Negative.  Fact Sheet for Patients: SugarRoll.be  Fact Sheet for Healthcare Providers: https://www.woods-mathews.com/  This test is not yet approved or cleared by the [place] FDA and  has been authorized for detection and/or diagnosis of SARS-CoV-2 by FDA under an Emergency Use Authorization (EUA). This EUA will remain  in effect (meaning this test can be used) for the duration of the COVID-19 declaration under Se ction 564(b)(1) of the Act, 21 U.S.C. section 360bbb-3(b)(1), unless the authorization is terminated or revoked sooner.  Performed at [facility], [place] [address], [place], [place] [address]   Culture, Urine     Status: None   Collection Time: 09/07/19  6:05 AM   Specimen: Urine, Catheterized  Result Value Ref Range Status   Specimen Description URINE, CATHETERIZED  Final   Special Requests NONE  Final   Culture   Final    NO GROWTH Performed at [facility], [address], [place], [place] [address]    Report Status 09/08/2019 FINAL  Final  Urine culture     Status: Abnormal   Collection Time: 09/16/19 12:30 PM   Specimen: In/Out Cath Urine  Result Value Ref Range Status   Specimen Description IN/OUT CATH URINE  Final   Special Requests   Final    NONE Performed at [facility], [place] [address] [place], [place] [address]    Culture >=100,000 COLONIES/mL PSEUDOMONAS AERUGINOSA (A)  Final   Report Status 09/18/2019 FINAL  Final   Organism ID, Bacteria PSEUDOMONAS AERUGINOSA (A)  Final      Susceptibility   Pseudomonas aeruginosa - MIC*    CEFTAZIDIME 2 SENSITIVE Sensitive     CIPROFLOXACIN 0.5 SENSITIVE Sensitive     GENTAMICIN <=1 SENSITIVE Sensitive     IMIPENEM 2 SENSITIVE Sensitive     PIP/TAZO <=4 SENSITIVE Sensitive     CEFEPIME 2 SENSITIVE  Sensitive     * >=100,000 COLONIES/mL PSEUDOMONAS AERUGINOSA  Blood Culture (routine x 2)     Status: Abnormal   Collection Time: 09/16/19 12:58 PM   Specimen: BLOOD LEFT HAND  Result Value Ref Range Status   Specimen Description BLOOD LEFT HAND  Final   Special Requests   Final    BOTTLES DRAWN AEROBIC AND ANAEROBIC Blood Culture results may not be optimal due to an inadequate volume of blood received in culture bottles   Culture  Setup Time   Final    GRAM NEGATIVE RODS  AEROBIC BOTTLE ONLY CRITICAL VALUE NOTED.  VALUE IS CONSISTENT WITH PREVIOUSLY REPORTED AND CALLED VALUE.    Culture (A)  Final    PSEUDOMONAS AERUGINOSA SUSCEPTIBILITIES PERFORMED ON PREVIOUS CULTURE WITHIN THE LAST 5 DAYS. Performed at [facility], [place] [address], [place], [place] [address]    Report Status 09/19/2019 FINAL  Final  Blood Culture (routine x 2)     Status: Abnormal   Collection Time: 09/16/19  1:00 PM   Specimen: BLOOD  Result Value Ref Range Status   Specimen Description BLOOD RIGHT ANTECUBITAL  Final   Special Requests   Final    BOTTLES DRAWN AEROBIC AND ANAEROBIC Blood Culture adequate volume   Culture  Setup Time   Final    GRAM NEGATIVE RODS IN BOTH AEROBIC AND ANAEROBIC BOTTLES CRITICAL RESULT CALLED TO, READ BACK BY AND VERIFIED WITH: Rubie Maid [id number] MLM Performed at [facility], [place] [address], [place], [place] [address]    Culture PSEUDOMONAS AERUGINOSA (A)  Final   Report Status 09/19/2019 FINAL  Final   Organism ID, Bacteria PSEUDOMONAS AERUGINOSA  Final      Susceptibility   Pseudomonas aeruginosa - MIC*    CEFTAZIDIME 2 SENSITIVE Sensitive     CIPROFLOXACIN <=0.25 SENSITIVE Sensitive     GENTAMICIN <=1 SENSITIVE Sensitive     IMIPENEM <=0.25 SENSITIVE Sensitive     PIP/TAZO <=4 SENSITIVE Sensitive     CEFEPIME 2 SENSITIVE Sensitive     * PSEUDOMONAS AERUGINOSA  Blood Culture ID Panel (Reflexed)     Status: Abnormal   Collection Time: 09/16/19  1:00  PM  Result Value Ref Range Status   Enterococcus species NOT DETECTED NOT DETECTED Final   Listeria monocytogenes NOT DETECTED NOT DETECTED Final   Staphylococcus species NOT DETECTED NOT DETECTED Final   Staphylococcus aureus (BCID) NOT DETECTED NOT DETECTED Final   Streptococcus species NOT DETECTED NOT DETECTED Final   Streptococcus agalactiae NOT DETECTED NOT DETECTED Final   Streptococcus pneumoniae NOT DETECTED NOT DETECTED Final   Streptococcus pyogenes NOT DETECTED NOT DETECTED Final   Acinetobacter baumannii NOT DETECTED NOT DETECTED Final   Enterobacteriaceae species NOT DETECTED NOT DETECTED Final   Enterobacter cloacae complex NOT DETECTED NOT DETECTED Final   Escherichia coli NOT DETECTED NOT DETECTED Final   Klebsiella oxytoca NOT DETECTED NOT DETECTED Final   Klebsiella pneumoniae NOT DETECTED NOT DETECTED Final   Proteus species NOT DETECTED NOT DETECTED Final   Serratia marcescens NOT DETECTED NOT DETECTED Final   Carbapenem resistance NOT DETECTED NOT DETECTED Final   Haemophilus influenzae NOT DETECTED NOT DETECTED Final   Neisseria meningitidis NOT DETECTED NOT DETECTED Final   Pseudomonas aeruginosa DETECTED (A) NOT DETECTED Final    Comment: CRITICAL RESULT CALLED TO, READ BACK BY AND VERIFIED WITH: PHARMD T DANG [id number] MLM    Candida albicans NOT DETECTED NOT DETECTED Final   Candida glabrata NOT DETECTED NOT DETECTED Final   Candida krusei NOT DETECTED NOT DETECTED Final   Candida parapsilosis NOT DETECTED NOT DETECTED Final   Candida tropicalis NOT DETECTED NOT DETECTED Final    Comment: Performed at [facility], [place] [address] [place], [place] [address]  SARS Coronavirus 2 by RT PCR (hospital order, performed in [facility] hospital lab) Nasopharyngeal Nasopharyngeal Swab     Status: None   Collection Time: 09/16/19  1:33 PM   Specimen: Nasopharyngeal Swab  Result Value Ref Range Status   SARS Coronavirus 2  NEGATIVE NEGATIVE Final    Comment:  (NOTE) SARS-CoV-2 target nucleic acids are NOT DETECTED.  The SARS-CoV-2 RNA is generally detectable in upper and lower respiratory specimens during the acute phase of infection. The lowest concentration of SARS-CoV-2 viral copies this assay can detect is 250 copies / mL. A negative result does not preclude SARS-CoV-2 infection and should not be used as the sole basis for treatment or other patient management decisions.  A negative result may occur with improper specimen collection / handling, submission of specimen other than nasopharyngeal swab, presence of viral mutation(s) within the areas targeted by this assay, and inadequate number of viral copies (<250 copies / mL). A negative result must be combined with clinical observations, patient history, and epidemiological information.  Fact Sheet for Patients:   StrictlyIdeas.no  Fact Sheet for Healthcare Providers: BankingDealers.co.za  This test is not yet approved or  cleared by the [place] FDA and has been authorized for detection and/or diagnosis of SARS-CoV-2 by FDA under an Emergency Use Authorization (EUA).  This EUA will remain in effect (meaning this test can be used) for the duration of the COVID-19 declaration under Section 564(b)(1) of the Act, 21 U.S.C. section 360bbb-3(b)(1), unless the authorization is terminated or revoked sooner.  Performed at [facility], [place] [address] [place], [place] [address]     Studies/Results: No results found.    Assessment/Plan:  INTERVAL HISTORY: pt slept poorly last night   Active Problems:   S/P TAVR (transcatheter aortic valve replacement)   Stroke (cerebrum) (HCC)   Sepsis (HCC)   Urinary tract infection without hematuria   Pseudomonas aeruginosa infection   Paroxysmal atrial fibrillation (HCC)    [name] is a 86 y.o. male with history of hairy cell leukemia, thrombocytopenia critical aortic  stenosis status post TAVR, with recent problems with urinary retention admitted with fevers confusion.  He was found on CT scan to have multiple areas of what appear to be ischemic strokes.  He was also in atrial fibrillation again.  His urine and blood cultures have grown Pseudomonas aeruginosa.  2D echocardiogram is unrevealing for endocarditis.  I do think the most likely explanation is that he simply has a urinary tract infection with bacteremia from this.  I doubt that his valve has been seeded so quickly.  I do though worry about under treating him should he have actually developed endocarditis of his artificial heart valve.  His CT description of strokes does not sound embolic but I would have some anxiety that they could potentially be.  I do not think getting a TEE would be the safest thing to do with him given his platelets being low and his problems with anesthesia.  My thought would be to have an intermediate plan of giving him 4 weeks of systemic antipseudomonal beta-lactam therapy.  I am ordering some surveillance blood cultures today.  We have central line placed tomorrow and plan on 4 weeks of therapy from date of start of initiation of antipseudomonal therapy.  He already has follow-up hospital follow-up with Dr. Megan Salon  Diagnosis: Bacteremia due to Pseudomonas aeruginosa  Culture Result: Pseudomonas aeruginosa  Allergies  Allergen Reactions  . Antazoline Anaphylaxis  . Antihistamines, Chlorpheniramine-Type     Nervous, jittery  . Sulfa Antibiotics Rash    OPAT Orders Discharge antibiotics:  Cefepime   Duration:  4 weeks  End Date:  August 13th, 2021  [facility] Care Per Protocol:   Labs  weekly while on IV antibiotics:  _x_ CBC with differential _x_ BMP w GFR/CMP   _x_ Please pull PIC at completion of IV antibiotics __ Please leave PIC in place until doctor has seen patient or been notified  Fax weekly labs to [phone]   [name] has  an appointment on 10/12/2019@ 1030 AM with Dr. Megan Salon  [facility] for Infectious Disease is located in the [facility] at  [address] in [place].  [address], which is located to the left of the elevators.  Phone: [phone]  Fax: [phone]  [email or web address]  Patient should arrive 15 minutes prior to his appt.       LOS: 3 days   Alcide Evener 09/19/2019, 2:44 PM

## 2019-09-19 NOTE — Progress Notes (Addendum)
Progress Note  Patient Name: MD SMOLA Date of Encounter: 09/19/2019  [facility] Cardiologist: Belva Crome III, MD   Subjective   No chest pain, no SOB up to sink with PT   Inpatient Medications    Scheduled Meds: .  stroke: mapping our early stages of recovery book   Does not apply Once  . atorvastatin  20 mg Oral q1800  . Chlorhexidine Gluconate Cloth  6 each Topical Daily  . cholecalciferol  2,000 Units Oral QPM  . hydrocortisone cream   Topical BID  . latanoprost  1 drop Both Eyes QHS  . metoprolol tartrate  12.5 mg Oral Daily  . pantoprazole  40 mg Oral Daily  . potassium chloride  40 mEq Oral Once  . predniSONE  40 mg Oral Q breakfast  . sertraline  50 mg Oral QPM  . tamsulosin  0.8 mg Oral QPC supper  . timolol  1 drop Right Eye Daily   Continuous Infusions: . ceFEPime (MAXIPIME) IV 2 g (09/19/19 0159)   PRN Meds: acetaminophen **OR** [DISCONTINUED] acetaminophen (TYLENOL) oral liquid 160 mg/5 mL **OR** [DISCONTINUED] acetaminophen, fluocinonide, hydrALAZINE, senna-docusate, triamcinolone cream   Vital Signs    Vitals:   09/18/19 2357 09/19/19 0329 09/19/19 0414 09/19/19 0755  BP: (!) 150/81 (!) 175/89 (!) 162/89 (!) 155/64  Pulse: 65 60 61 (!) 40  Resp:    16  Temp: 98.6 F (37 C) 98.3 F (36.8 C)  98.5 F (36.9 C)  TempSrc: Oral Oral  Oral  SpO2: 94% 95%  94%  Weight:      Height:        Intake/Output Summary (Last 24 hours) at 09/19/2019 1040 Last data filed at 09/19/2019 0905 Gross per 24 hour  Intake 340 ml  Output 2000 ml  Net -1660 ml   Last 3 Weights 09/16/2019 09/14/2019 09/13/2019  Weight (lbs) 175 lb 4.3 oz 173 lb 12.8 oz 173 lb 8 oz  Weight (kg) 79.5 kg 78.835 kg 78.699 kg      Telemetry    SR with PACs also 13 sec burst of SVT  Regular  - Personally Reviewed  ECG    SR today with chronic RBBB and LAFB no acute changes - Personally Reviewed  Physical Exam   GEN: No acute distress.   Neck: No JVD Cardiac: RRR, no  murmurs, rubs, or gallops.  Respiratory: Clear to auscultation bilaterally. GI: Soft, nontender, non-distended  MS: No edema; No deformity. Neuro:  Nonfocal  Psych: Normal affect   Labs    High Sensitivity Troponin:  No results for input(s): TROPONINIHS in the last 720 hours.    Chemistry Recent Labs  Lab 09/16/19 1146 09/16/19 1151 09/17/19 0752 09/18/19 0511 09/19/19 0838  NA 133*   < > 134* 131* 133*  K 4.2   < > 3.6 3.5 3.4*  CL 97*   < > 100 99 102  CO2 25   < > 21* 23 23  GLUCOSE 97   < > 91 154* 169*  BUN 23   < > 20 22 19   CREATININE 1.72*   < > 1.51* 1.23 1.08  CALCIUM 8.4*   < > 7.8* 7.7* 8.3*  PROT 4.9*  --   --  4.6*  --   ALBUMIN 2.8*  --   --  2.2*  --   AST 24  --   --  19  --   ALT 27  --   --  21  --  ALKPHOS 51  --   --  59  --   BILITOT 1.3*  --   --  0.5  --   GFRNONAA 34*   < > 39* 50* 59*  GFRAA 39*   < > 45* 58* >60  ANIONGAP 11   < > 13 9 8    < > = values in this interval not displayed.     Hematology Recent Labs  Lab 09/17/19 0752 09/18/19 0511 09/19/19 0838  WBC 26.2* 25.3* 19.9*  RBC 4.12* 3.97* 4.23  HGB 10.4* 10.1* 10.5*  HCT 32.7* 31.0* 33.1*  MCV 79.4* 78.1* 78.3*  MCH 25.2* 25.4* 24.8*  MCHC 31.8 32.6 31.7  RDW 18.6* 18.2* 18.2*  PLT 73* 57* 60*    BNPNo results for input(s): BNP, PROBNP in the last 168 hours.   DDimer No results for input(s): DDIMER in the last 168 hours.   Radiology    n/a  Cardiac Studies   Echo: 09/17/19:  LVEF 60 of disease to 5%.  No R WMA.  GR 1 DD with elevated LAP.  RV function normal.  Edwards Sapien 3 THV size 26 mm, model #[id number] is in the AV  position. Mildly elevated mean gradient of 25 mmHg based on published  normals, has signifcantly increased from prior study 09/07/19. The LVOT/ AV  VTI ratio has also decreased. Limited morphological visualization of prosthetic valve in general with TTE, no gross visualized thrombus or vegetation.   Given presentation with bacteremia, CVA, and  increased gradient across prosthetic valve would  recommend TEE for better visualization. . The  aortic valve has been repaired/replaced. Aortic valve regurgitation is not  visualized. There is a 26 mm Edwards Sapien prosthetic (TAVR) valve  present in the aortic position. Procedure Date: 09/06/2019.  5. The inferior vena cava is normal in size with greater than 50%  respiratory variability, suggesting right atrial pressure of 3 mmHg.    Patient Profile     86 y.o. male with CAD, severe AS s/p TAVR on 09/06/19, RBBB, chronic thrombocytopenia and urinary retention who was discharged home last week after TAVR with a foley in place for urinary retention. He had the foley catheter removed two days ago in the urology office and has not voided well since then. His wife notes increasing LE edema over the past few days. He went into the urology office today for follow up after his wife reported fevers at home. While in the urology office, he developed facial droop and aphasia and was brought to [facility] via EMS. He was seen by Neurology and not felt to a candidate for tPA. Stroke like symptoms resolved. Head CT with evidence of acute/subacute infarct in the left frontal lobe. Of note, he was discharged on ASA alone following his TAVR given history of profound thrombocytopenia which is followed closely in hematology by Dr. Benay Spice. He was wearing a cardiac monitor post TAVR and had atrial fibrillation noted. The valve team discussed this and elected to wait until we saw his total AF burden to consider whether or not anti-coagulation should be discussed with Hematology given his chronic issues.    Assessment & Plan    1. Urosepsis with bacteremia due to Pseudomonas infection. Fever is down. WBC improved to 19.9 K. On appropriate antibiotics and ID is following. Low risk for endocarditis. I reviewed Echo. There is some increase in gradient compared to post procedure but I suspect this is mainly related to  increased Cardiac output with his septicemia. I don't  feel a TEE is warranted at this point.  ID have difference of opinion on TEE 2. Recent left frontal lobe CVA. ? Complication from TAVR versus Afib. Not a good candidate for anticoagulation due to thrombocytopenia. plts 60K.  Neurologically intact today  3. Severe AS s/p recent TAVR.  -> Per Angelena Form, PA (TAVR Coordinator) -after discussion with Dr. Malachy Mood, okay to start aspirin 81 mg a day. 4. Paroxysmal Afib. Low burden. In NSR here.  Short burst 13 sec of SVT at 150 regular this AM at 0300.  5. Chronic diastolic CHF. Given lasix yesterday. Will hold with AKI 6. CAD stable by cath in May 2021. Back on lopressor at 12.5 mg daily (outpt dose) 7. HDL on statin 8. HTN BP soft - metoprolol 12.5 daily home dose   9. AKI. Due to sepsis. Creatinine 1.72 Now improving to 1.08 now. Would hold lasix for now. 10. Thrombocytopenia  60K  Not on ASA or anticoagulation.  We had deferred to hematology - pt from cardiology view benefit from ASA but defer to Dr. Benay Spice.         For questions or updates, please contact [place] Please consult [email or web address] for contact info under        Signed, Cecilie Kicks, NP  09/19/2019, 10:40 AM    ATTENDING ATTESTATION  I have seen, examined and evaluated the patient this PM after he was seen by Allie Dimmer, NP.  After reviewing all the available data and chart, we discussed the patients laboratory, study & physical findings as well as symptoms in detail. I agree with her findings, examination as well as impression recommendations as per our discussion.    Overall he seems to doing okay from a cardiac standpoint.  TAVR team coordinator has recommended starting aspirin.  I agree that TEE is not warranted in this situation a based on the bacterial cultures of also because it would not necessarily change management options.  TAVR coordinator PA has discussed with Dr. Benay Spice -> OK to restart ASA  (they remain unsure as to whether or not he can start [employer] in the future)  He seems to be doing relatively well from volume status standpoint etc.  Looks like he is close to being stable for discharge from a cardiac standpoint.  We will assess tomorrow, and if no changes, would like to not have any further recommendations.  Anticipated sign off tomorrow.  Glenetta Hew, M.D., M.S. Interventional Cardiologist   Pager # [phone] Phone # [phone] [address] [place], [place] [address]

## 2019-09-19 NOTE — Progress Notes (Signed)
PHARMACY CONSULT NOTE FOR:  OUTPATIENT  PARENTERAL ANTIBIOTIC THERAPY (OPAT)  Indication: Pseudomonal bacteremia Regimen: Cefepime 2g IV q 12 hrs End date: 10/14/2019  IV antibiotic discharge orders are pended. To discharging provider:  please sign these orders via discharge navigator,  Select New Orders & click on the button choice - Manage This Unsigned Work.     Thank you for allowing pharmacy to be a part of this patient's care.  Adriell Polansky S. Alford Highland, PharmD, BCPS Clinical Staff Pharmacist [email or web address] [place] 09/19/2019, 3:05 PM

## 2019-09-19 NOTE — Evaluation (Signed)
Occupational Therapy Evaluation Patient Details Name: [name] MRN: [id number] DOB: January 27, 1924 Today's Date: 09/19/2019    History of Present Illness [name] is a 86 yo male presenting with AMS. Upon work-up, imaging revealed large, left-sided MCA acute/early subacute infarct and UA indicates UTI. PMH includes: AS s/p TAVR on 09/06/19, CAD, HTN, chronic thrombocytopenia, hairy cell lymphoma s/p splenectomy, and chronic leukocytosis.   Clinical Impression   Patient supine in bed and agreeable to OT session.  Completing transfers with min guard using RW, LB dressing with min guard assist and grooming at sink with min guard. Good awareness to safety and activity tolerance improving. Spouse present and supportive. Will follow acutely.     Follow Up Recommendations  Home health OT;Supervision/Assistance - 24 hour    Equipment Recommendations  3 in 1 bedside commode    Recommendations for Other Services       Precautions / Restrictions Precautions Precautions: Fall Precaution Comments: is s/p TAVR 7/6 with cardiac monitor still in place Restrictions Weight Bearing Restrictions: No      Mobility Bed Mobility Overal bed mobility: Needs Assistance Bed Mobility: Supine to Sit     Supine to sit: Supervision     General bed mobility comments: close supervision for safety   Transfers Overall transfer level: Needs assistance Equipment used: None Transfers: Sit to/from Stand Sit to Stand: Min guard         General transfer comment: for safety/balance    Balance Overall balance assessment: Needs assistance Sitting-balance support: No upper extremity supported;Feet supported Sitting balance-Leahy Scale: Good     Standing balance support: Bilateral upper extremity supported;During functional activity;No upper extremity supported Standing balance-Leahy Scale: Fair Standing balance comment: able to engage in ADLs standing with min guard, preference to BUE support on RW  during ambulation                            ADL either performed or assessed with clinical judgement   ADL Overall ADL's : Needs assistance/impaired     Grooming: Wash/dry hands;Wash/dry face;Oral care;Min guard;Standing Grooming Details (indicate cue type and reason): min guard for dynamic sitting balance at sink standing              Lower Body Dressing: Min guard Lower Body Dressing Details (indicate cue type and reason): able to adjust socks with supervision seated, min guard sit to stand  Toilet Transfer: Min guard;Ambulation;RW Toilet Transfer Details (indicate cue type and reason): simulated in room            General ADL Comments: retrieves washclothes from overhead cabinent with min guard      Vision         Perception     Praxis      Pertinent Vitals/Pain Pain Assessment: No/denies pain     Hand Dominance     Extremity/Trunk Assessment             Communication     Cognition Arousal/Alertness: Awake/alert Behavior During Therapy: WFL for tasks assessed/performed Overall Cognitive Status: Within Functional Limits for tasks assessed                                 General Comments: cognition appears funtional when the pt is able to hear questions/commands   General Comments  Pt wife present and supportive. VSS throughout. Pt has a small grey box that is  part of heart monitor from TAVR that must stay within 10 ft of him at all times    Exercises     Shoulder Instructions      Home Living                                          Prior Functioning/Environment                   OT Problem List:        OT Treatment/Interventions:      OT Goals(Current goals can be found in the care plan section) Acute Rehab OT Goals Patient Stated Goal: return home OT Goal Formulation: With patient  OT Frequency: Min 2X/week   Barriers to D/C:            Co-evaluation              AM-PAC  OT "6 Clicks" Daily Activity     Outcome Measure Help from another person eating meals?: A Little Help from another person taking care of personal grooming?: A Little Help from another person toileting, which includes using toliet, bedpan, or urinal?: A Little Help from another person bathing (including washing, rinsing, drying)?: A Little Help from another person to put on and taking off regular upper body clothing?: A Little Help from another person to put on and taking off regular lower body clothing?: A Little 6 Click Score: 18   End of Session Equipment Utilized During Treatment: Gait belt;Rolling walker Nurse Communication: Mobility status  Activity Tolerance: Patient tolerated treatment well Patient left: in chair;with call bell/phone within reach;with chair alarm set;with family/visitor present  OT Visit Diagnosis: Unsteadiness on feet (R26.81);Muscle weakness (generalized) (M62.81)                Time: 9563-8756 OT Time Calculation (min): 27 min Charges:  OT General Charges $OT Visit: 1 Visit OT Treatments $Self Care/Home Management : 23-37 mins  [place] Pager [phone] Office [phone]   Delight Stare 09/19/2019, 1:18 PM

## 2019-09-19 NOTE — Progress Notes (Signed)
TCD completed.  09/19/2019 1:48 PM Kelby Aline., MHA, RVT, RDCS, RDMS

## 2019-09-19 NOTE — TOC Initial Note (Signed)
Transition of Care [facility]) - Initial/Assessment Note    Patient Details  Name: [name] MRN: [id number] Date of Birth: 03-14-1923  Transition of [facility]) CM/SW Contact:    Geralynn Ochs, LCSW Phone Number: 09/19/2019, 1:08 PM  Clinical Narrative:     CSW met with patient and wife at bedside to discuss recommendation for [facility]. Wife discussed how they had been set up with home health previously but they were not helpful, so she was not sure if she wanted it. Wife asked for time to think about it. CSW to talk to wife again tomorrow to ask about home health services at discharge. No DME needed, patient has all equipment at home.              Expected Discharge Plan: [place] Barriers to Discharge: Continued Medical Work up   Patient Goals and CMS Choice Patient states their goals for this hospitalization and ongoing recovery are:: to get home CMS Medicare.gov Compare Post Acute Care list provided to:: Patient Choice offered to / list presented to : Patient, Spouse  Expected Discharge Plan and Services Expected Discharge Plan: [place] Acute Care Choice: NA Living arrangements for the past 2 months: Single Family Home                                      Prior Living Arrangements/Services Living arrangements for the past 2 months: Single Family Home Lives with:: Spouse              Current home services: DME    Activities of Daily Living      Permission Sought/Granted                  Emotional Assessment              Admission diagnosis:  Sepsis ([place]) [A41.9] Urinary tract infection without hematuria, site unspecified [N39.0] Cerebrovascular accident (CVA), unspecified mechanism ([place]) [I63.9] Patient Active Problem List   Diagnosis Date Noted  . Pseudomonas aeruginosa infection 09/17/2019  . Paroxysmal atrial fibrillation ([place]) 09/17/2019  . Stroke (cerebrum) ([place]) 09/16/2019  . Sepsis  ([place]) 09/16/2019  . Urinary tract infection without hematuria   . Acute on chronic diastolic heart failure ([place]) 09/06/2019  . Bifascicular block 09/06/2019  . 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 Severe aortic stenosis   . Chronic ITP (idiopathic thrombocytopenia) (HCC) 07/04/2019  . Chronic eczematous otitis externa of both ears 01/15/2016  . Sensorineural hearing loss (SNHL) of both ears 01/15/2016  . Prostate nodule 03/09/2015  . Constipation 06/22/2014  . Malaise and fatigue 06/22/2014  . CAD (coronary artery disease)   . GERD (gastroesophageal reflux disease)   . Hypertension   . Dyslipidemia   . Colon cancer ([place])   . Solitary pulmonary nodule 02/11/2013  . Leukemic reticuloendotheliosis, extranodal and solid organ sites [facility]) 01/03/2013  . Glaucoma 11/27/2011  . Personal history of other malignant neoplasm of skin 10/14/2011  . Elevated PSA 09/12/2011  . Liposarcoma, well differentiated type ([place]) 09/12/2011  . Traction detachment of retina 11/18/2010  . Exudative age-related macular degeneration ([place]) 04/16/2010  . Epiretinal membrane 04/16/2010  . Vitreous degeneration 04/16/2010  . Staphylococcus aureus infection 11/30/2006   PCP:  [name], MD Pharmacy:   [facility] [phone] - [place], [place]  AT [address] [place] [address] Phone: [phone] Fax: [phone]  [facility] [place], [place] [address] Phone: [phone] Fax: [phone]     Social Determinants of Health ([place]) Interventions    Readmission Risk Interventions Readmission Risk Prevention Plan 09/09/2019  Post Dischage Appt Complete  Medication Screening Complete  Transportation Screening Complete  Some recent data might be hidden

## 2019-09-19 NOTE — Progress Notes (Signed)
[name]  NLG:[id number] DOB: 1923-12-05 DOA: 09/16/2019 PCP: [name], MD    Brief Narrative:  86 year old with a history of CAD, aortic stenosis status post TAVR 09/06/2019, RBBB, LAFB, urinary retention with frequent UTIs, chronic diastolic CHF, colon cancer, chronic thrombocytopenia, hairy cell lymphoma status post splenectomy, chronic leukocytosis, and BPH who presented to the ED with altered mental status.  The patient was doing well status post TAVR.  His postoperative aspirin had to be discontinued due to large bruises and hematoma of the right groin with a low platelet count.  The patient developed the abrupt onset of chills and urinary retention.  His wife found his temperature to be 101.  The patient visited his urologist office where a Foley catheter had to be placed and while there he suffered the acute onset of a right facial droop and acute aphasia.  He was sent to the ED via EMS.  In the ED CT confirmed a large left-sided MCA acute versus subacute CVA.  UA was also consistent with a UTI.  Exam in the ED however revealed no focal neurologic deficits.  Antimicrobials:  Rocephin 7/16 Cefepime 7/17 >  Subjective: No new complaints.  Resting comfortably in bed.  Denies chest pain shortness of breath fevers or chills.  States he has a good appetite.  Assessment & Plan:  Sepsis due to Pseudomonas urinary tract infection (POA)  + Pseudomonas bacteremia Pan-sensitive on testing - cont cefepime with duration and possibility of transitioning to oral agent to be determined by ID - sepsis physiology resolved   Acute left frontal lobe stroke - L MCA territory  Workup as per Stroke Team - MRI/MRA not able to be accomplished due to Zio patch pt is wearing - LDL 43 - A1c 6.6 - therapy suggests HHPT and OT and no need for SLP - TTE noted EF 60-65% w/ grade 1 DD w/ no gross evidence of embolic source - ASA tx had to be held as of 7/13 due to bleeding/bruising of groin and low plt count -  Plavix dosed x2 since admit but since stopped - Stroke Team does not feel he is a candidate for DAPT or anticoag - suggestion is for ASA 81mg  QD if approved by his Hematologist   Acute kidney injury Creatinine 1.01 at time of discharge 09/09/2019 -1.72 at presentation - most c/w simple prerenal state -creatinine has now normalized  Recent Labs  Lab 09/16/19 1146 09/16/19 1151 09/17/19 0752 09/18/19 0511 09/19/19 0838  CREATININE 1.72* 1.60* 1.51* 1.23 1.08    Aortic stenosis status post TAVR 09/06/2019 Discharged home on aspirin alone given chronic thrombocytopenia, and this had to be stopped in f/u w/ his Hematologist due to declining Plt count and bleeding into groin related to recent TAVR - valve appears safe/normally functioning per Cards team   Paroxysmal newly diagnosed atrial fibrillation Patient was sent home post TAVR with a cardiac monitor which did confirm intermittent episodes of atrial fibrillation - cardiology was following to determine arrhythmia burden to make a difficult decision regarding anticoagulation in the setting of chronic thrombocytopenia - Cards attending to this issue   Recurrent urinary retention Required discharge home with Foley catheter - subsequently removed 7/14 with recurrent urinary retention necessitating replacement of catheter 7/16 at urologist office -Foley catheter to remain with patient to follow-up with Dr. Junious Silk at [facility] urology for timing of next voiding trial  Chronic thrombocytopenia Followed by Dr. Benay Spice - on weekly Nplate tx (last 0/81) and chronic prednisone (since 07/25/19) -  the patient and his wife confirm that he did not get his Nplate 7/16 - dosed 8/29 as inpatient   Chronic leukocytosis WBC 26 at time of discharge 09/09/2019 -perhaps this is simply related to his chronic prednisone therapy  CAD Cardiac cath 5/25 noted chronically occluded mid LAD, moderate disease in the diagonals and mid RCA -medical management  Chronic  diastolic CHF No gross volume overload evident presently with patient actually dehydrated at presentation  Mild hyponatremia Sodium stable in follow-up today  Mild hypokalemia Supplement and follow trend -check magnesium  Microcytic anemia No evidence of current ongoing blood loss to a significant extent -hemoglobin stable  HLD Continue usual medical therapy  HTN Blood pressure well controlled at present  Elevated hemoglobin A1c A1c 6.6 at presentation - monitor CBG -likely a simple consequence of steroid therapy   DVT prophylaxis: SCDs Code Status: FULL CODE Family Communication:  Status is: Inpatient  Remains inpatient appropriate because:IV treatments appropriate due to intensity of illness or inability to take PO   Dispo: The patient is from: Home              Anticipated d/c is to: Home              Anticipated d/c date is: 3 days              Patient currently is not medically stable to d/c.    Consultants:  Stroke Team Cardiology Infectious Disease  Objective: Blood pressure (!) 155/64, pulse (!) 40, temperature 98.5 F (36.9 C), temperature source Oral, resp. rate 16, height 5\' 4"  (1.626 m), weight 79.5 kg, SpO2 94 %.  Intake/Output Summary (Last 24 hours) at 09/19/2019 1035 Last data filed at 09/19/2019 0905 Gross per 24 hour  Intake 340 ml  Output 2000 ml  Net -1660 ml   Filed Weights   09/16/19 1221  Weight: 79.5 kg    Examination: General: No acute respiratory distress Lungs: CTA B without wheezing Cardiovascular: RRR -no rub Abdomen: NT/ND, soft, BS positive, no rebound Extremities: No signif edema bilateral lower extremities  CBC: Recent Labs  Lab 09/13/19 1102 09/13/19 1102 09/16/19 1146 09/16/19 1151 09/17/19 0752 09/18/19 0511 09/19/19 0838  WBC 19.7*  --  36.1*   < > 26.2* 25.3* 19.9*  NEUTROABS 12.7*  --  28.3*  --  21.3*  --   --   HGB 11.2*  --  10.6*   < > 10.4* 10.1* 10.5*  HCT 34.4*   < > 33.6*   < > 32.7* 31.0* 33.1*   MCV 80.6   < > 81.6   < > 79.4* 78.1* 78.3*  PLT 56*  --  62*   < > 73* 57* 60*   < > = values in this interval not displayed.   Basic Metabolic Panel: Recent Labs  Lab 09/17/19 0752 09/18/19 0511 09/19/19 0838  NA 134* 131* 133*  K 3.6 3.5 3.4*  CL 100 99 102  CO2 21* 23 23  GLUCOSE 91 154* 169*  BUN 20 22 19   CREATININE 1.51* 1.23 1.08  CALCIUM 7.8* 7.7* 8.3*   GFR: Estimated Creatinine Clearance: 40.7 mL/min (by C-G formula based on SCr of 1.08 mg/dL).  Liver Function Tests: Recent Labs  Lab 09/16/19 1146 09/18/19 0511  AST 24 19  ALT 27 21  ALKPHOS 51 59  BILITOT 1.3* 0.5  PROT 4.9* 4.6*  ALBUMIN 2.8* 2.2*    Coagulation Profile: Recent Labs  Lab 09/16/19 1146  INR 1.1  HbA1C: Hgb A1c MFr Bld  Date/Time Value Ref Range Status  09/17/2019 07:52 AM 6.6 (H) 4.8 - 5.6 % Final    Comment:    (NOTE) Pre diabetes:          5.7%-6.4%  Diabetes:              >6.4%  Glycemic control for   <7.0% adults with diabetes   09/02/2019 08:47 AM 6.5 (H) 4.8 - 5.6 % Final    Comment:    (NOTE) Pre diabetes:          5.7%-6.4%  Diabetes:              >6.4%  Glycemic control for   <7.0% adults with diabetes     CBG: Recent Labs  Lab 09/16/19 1146  GLUCAP 78    Recent Results (from the past 240 hour(s))  Urine culture     Status: Abnormal   Collection Time: 09/16/19 12:30 PM   Specimen: In/Out Cath Urine  Result Value Ref Range Status   Specimen Description IN/OUT CATH URINE  Final   Special Requests   Final    NONE Performed at [facility], [address], [place], [place] [address]    Culture >=100,000 COLONIES/mL PSEUDOMONAS AERUGINOSA (A)  Final   Report Status 09/18/2019 FINAL  Final   Organism ID, Bacteria PSEUDOMONAS AERUGINOSA (A)  Final      Susceptibility   Pseudomonas aeruginosa - MIC*    CEFTAZIDIME 2 SENSITIVE Sensitive     CIPROFLOXACIN 0.5 SENSITIVE Sensitive     GENTAMICIN <=1 SENSITIVE Sensitive     IMIPENEM 2  SENSITIVE Sensitive     PIP/TAZO <=4 SENSITIVE Sensitive     CEFEPIME 2 SENSITIVE Sensitive     * >=100,000 COLONIES/mL PSEUDOMONAS AERUGINOSA  Blood Culture (routine x 2)     Status: Abnormal   Collection Time: 09/16/19 12:58 PM   Specimen: BLOOD LEFT HAND  Result Value Ref Range Status   Specimen Description BLOOD LEFT HAND  Final   Special Requests   Final    BOTTLES DRAWN AEROBIC AND ANAEROBIC Blood Culture results may not be optimal due to an inadequate volume of blood received in culture bottles   Culture  Setup Time   Final    GRAM NEGATIVE RODS AEROBIC BOTTLE ONLY CRITICAL VALUE NOTED.  VALUE IS CONSISTENT WITH PREVIOUSLY REPORTED AND CALLED VALUE.    Culture (A)  Final    PSEUDOMONAS AERUGINOSA SUSCEPTIBILITIES PERFORMED ON PREVIOUS CULTURE WITHIN THE LAST 5 DAYS. Performed at [facility], [place] [address] [place], [place] [address]    Report Status 09/19/2019 FINAL  Final  Blood Culture (routine x 2)     Status: Abnormal   Collection Time: 09/16/19  1:00 PM   Specimen: BLOOD  Result Value Ref Range Status   Specimen Description BLOOD RIGHT ANTECUBITAL  Final   Special Requests   Final    BOTTLES DRAWN AEROBIC AND ANAEROBIC Blood Culture adequate volume   Culture  Setup Time   Final    GRAM NEGATIVE RODS IN BOTH AEROBIC AND ANAEROBIC BOTTLES CRITICAL RESULT CALLED TO, READ BACK BY AND VERIFIED WITH: Rubie Maid [id number] Performed at [facility], [place] [address], [place], [place] [address]    Culture PSEUDOMONAS AERUGINOSA (A)  Final   Report Status 09/19/2019 FINAL  Final   Organism ID, Bacteria PSEUDOMONAS AERUGINOSA  Final      Susceptibility   Pseudomonas  aeruginosa - MIC*    CEFTAZIDIME 2 SENSITIVE Sensitive     CIPROFLOXACIN <=0.25 SENSITIVE Sensitive     GENTAMICIN <=1 SENSITIVE Sensitive     IMIPENEM <=0.25 SENSITIVE Sensitive     PIP/TAZO <=4 SENSITIVE Sensitive     CEFEPIME 2 SENSITIVE Sensitive     * PSEUDOMONAS AERUGINOSA  Blood  Culture ID Panel (Reflexed)     Status: Abnormal   Collection Time: 09/16/19  1:00 PM  Result Value Ref Range Status   Enterococcus species NOT DETECTED NOT DETECTED Final   Listeria monocytogenes NOT DETECTED NOT DETECTED Final   Staphylococcus species NOT DETECTED NOT DETECTED Final   Staphylococcus aureus (BCID) NOT DETECTED NOT DETECTED Final   Streptococcus species NOT DETECTED NOT DETECTED Final   Streptococcus agalactiae NOT DETECTED NOT DETECTED Final   Streptococcus pneumoniae NOT DETECTED NOT DETECTED Final   Streptococcus pyogenes NOT DETECTED NOT DETECTED Final   Acinetobacter baumannii NOT DETECTED NOT DETECTED Final   Enterobacteriaceae species NOT DETECTED NOT DETECTED Final   Enterobacter cloacae complex NOT DETECTED NOT DETECTED Final   Escherichia coli NOT DETECTED NOT DETECTED Final   Klebsiella oxytoca NOT DETECTED NOT DETECTED Final   Klebsiella pneumoniae NOT DETECTED NOT DETECTED Final   Proteus species NOT DETECTED NOT DETECTED Final   Serratia marcescens NOT DETECTED NOT DETECTED Final   Carbapenem resistance NOT DETECTED NOT DETECTED Final   Haemophilus influenzae NOT DETECTED NOT DETECTED Final   Neisseria meningitidis NOT DETECTED NOT DETECTED Final   Pseudomonas aeruginosa DETECTED (A) NOT DETECTED Final    Comment: CRITICAL RESULT CALLED TO, READ BACK BY AND VERIFIED WITH: PHARMD T DANG [id number] MLM    Candida albicans NOT DETECTED NOT DETECTED Final   Candida glabrata NOT DETECTED NOT DETECTED Final   Candida krusei NOT DETECTED NOT DETECTED Final   Candida parapsilosis NOT DETECTED NOT DETECTED Final   Candida tropicalis NOT DETECTED NOT DETECTED Final    Comment: Performed at [facility], [address], [place], [place] [address]  SARS Coronavirus 2 by RT PCR (hospital order, performed in [facility] hospital lab) Nasopharyngeal Nasopharyngeal Swab     Status: None   Collection Time: 09/16/19  1:33 PM   Specimen: Nasopharyngeal Swab    Result Value Ref Range Status   SARS Coronavirus 2 NEGATIVE NEGATIVE Final    Comment: (NOTE) SARS-CoV-2 target nucleic acids are NOT DETECTED.  The SARS-CoV-2 RNA is generally detectable in upper and lower respiratory specimens during the acute phase of infection. The lowest concentration of SARS-CoV-2 viral copies this assay can detect is 250 copies / mL. A negative result does not preclude SARS-CoV-2 infection and should not be used as the sole basis for treatment or other patient management decisions.  A negative result may occur with improper specimen collection / handling, submission of specimen other than nasopharyngeal swab, presence of viral mutation(s) within the areas targeted by this assay, and inadequate number of viral copies (<250 copies / mL). A negative result must be combined with clinical observations, patient history, and epidemiological information.  Fact Sheet for Patients:   StrictlyIdeas.no  Fact Sheet for Healthcare Providers: BankingDealers.co.za  This test is not yet approved or  cleared by the [place] FDA and has been authorized for detection and/or diagnosis of SARS-CoV-2 by FDA under an Emergency Use Authorization (EUA).  This EUA will remain in effect (meaning this test can be used) for the duration of the COVID-19 declaration under Section 564(b)(1) of the  Act, 21 U.S.C. section 360bbb-3(b)(1), unless the authorization is terminated or revoked sooner.  Performed at [facility], [place] [address] [place], [place] [address]      Scheduled Meds:   stroke: mapping our early stages of recovery book   Does not apply Once   atorvastatin  20 mg Oral q1800   Chlorhexidine Gluconate Cloth  6 each Topical Daily   cholecalciferol  2,000 Units Oral QPM   hydrocortisone cream   Topical BID   latanoprost  1 drop Both Eyes QHS   metoprolol tartrate  12.5 mg Oral Daily   pantoprazole  40 mg  Oral Daily   predniSONE  40 mg Oral Q breakfast   sertraline  50 mg Oral QPM   tamsulosin  0.8 mg Oral QPC supper   timolol  1 drop Right Eye Daily   Continuous Infusions:  ceFEPime (MAXIPIME) IV 2 g (09/19/19 0159)     LOS: 3 days   Cherene Altes, MD [facility] Office  [phone] Pager - Text Page per Shea Evans  If 7PM-7AM, please contact night-coverage per Amion 09/19/2019, 10:35 AM

## 2019-09-20 ENCOUNTER — Inpatient Hospital Stay: Payer: Self-pay

## 2019-09-20 DIAGNOSIS — D72829 Elevated white blood cell count, unspecified: Secondary | ICD-10-CM

## 2019-09-20 DIAGNOSIS — Z9081 Acquired absence of spleen: Secondary | ICD-10-CM

## 2019-09-20 DIAGNOSIS — D696 Thrombocytopenia, unspecified: Secondary | ICD-10-CM

## 2019-09-20 DIAGNOSIS — Z952 Presence of prosthetic heart valve: Secondary | ICD-10-CM | POA: Diagnosis not present

## 2019-09-20 DIAGNOSIS — I480 Paroxysmal atrial fibrillation: Secondary | ICD-10-CM | POA: Diagnosis not present

## 2019-09-20 DIAGNOSIS — I639 Cerebral infarction, unspecified: Secondary | ICD-10-CM | POA: Diagnosis not present

## 2019-09-20 LAB — CBC
HCT: 32.2 % — ABNORMAL LOW (ref 39.0–52.0)
Hemoglobin: 10.6 g/dL — ABNORMAL LOW (ref 13.0–17.0)
MCH: 25.7 pg — ABNORMAL LOW (ref 26.0–34.0)
MCHC: 32.9 g/dL (ref 30.0–36.0)
MCV: 78.2 fL — ABNORMAL LOW (ref 80.0–100.0)
Platelets: 54 10*3/uL — ABNORMAL LOW (ref 150–400)
RBC: 4.12 MIL/uL — ABNORMAL LOW (ref 4.22–5.81)
RDW: 18 % — ABNORMAL HIGH (ref 11.5–15.5)
WBC: 18.3 10*3/uL — ABNORMAL HIGH (ref 4.0–10.5)
nRBC: 0.3 % — ABNORMAL HIGH (ref 0.0–0.2)

## 2019-09-20 LAB — BASIC METABOLIC PANEL
Anion gap: 8 (ref 5–15)
BUN: 23 mg/dL (ref 8–23)
CO2: 23 mmol/L (ref 22–32)
Calcium: 8.3 mg/dL — ABNORMAL LOW (ref 8.9–10.3)
Chloride: 100 mmol/L (ref 98–111)
Creatinine, Ser: 1.02 mg/dL (ref 0.61–1.24)
GFR calc Af Amer: >60 mL/min (ref >60)
GFR calc non Af Amer: >60 mL/min (ref >60)
Glucose, Bld: 171 mg/dL — ABNORMAL HIGH (ref 70–99)
Potassium: 4.4 mmol/L (ref 3.5–5.1)
Sodium: 131 mmol/L — ABNORMAL LOW (ref 135–145)

## 2019-09-20 LAB — MAGNESIUM: Magnesium: 1.6 mg/dL — ABNORMAL LOW (ref 1.7–2.4)

## 2019-09-20 MED ORDER — FUROSEMIDE 20 MG PO TABS
20.0000 mg | ORAL_TABLET | Freq: Every day | ORAL | Status: DC
  Administered 2019-09-20 – 2019-09-21 (×2): 20 mg via ORAL
  Filled 2019-09-20 (×2): qty 1

## 2019-09-20 MED ORDER — SODIUM CHLORIDE 0.9% FLUSH
10.0000 mL | Freq: Two times a day (BID) | INTRAVENOUS | Status: DC
  Administered 2019-09-20 – 2019-09-21 (×2): 10 mL

## 2019-09-20 MED ORDER — SODIUM CHLORIDE 0.9% FLUSH: 10.0000 mL | INTRAVENOUS | Status: DC | PRN

## 2019-09-20 MED ORDER — FUROSEMIDE 40 MG PO TABS: 40.0000 mg | ORAL_TABLET | Freq: Every day | ORAL | Status: DC

## 2019-09-20 MED ORDER — POTASSIUM CHLORIDE CRYS ER 10 MEQ PO TBCR
10.0000 meq | EXTENDED_RELEASE_TABLET | Freq: Every day | ORAL | Status: DC
  Administered 2019-09-21: 10 meq via ORAL
  Filled 2019-09-20 (×2): qty 1

## 2019-09-20 MED ORDER — ZOLPIDEM TARTRATE 5 MG PO TABS
5.0000 mg | ORAL_TABLET | Freq: Every evening | ORAL | Status: DC | PRN
  Filled 2019-09-20: qty 1

## 2019-09-20 MED ORDER — POTASSIUM CHLORIDE ER 10 MEQ PO TBCR: 10.0000 meq | EXTENDED_RELEASE_TABLET | Freq: Every day | ORAL | Status: DC

## 2019-09-20 MED ORDER — MAGNESIUM SULFATE 2 GM/50ML IV SOLN
2.0000 g | Freq: Once | INTRAVENOUS | Status: AC
  Administered 2019-09-20: 2 g via INTRAVENOUS
  Filled 2019-09-20: qty 50

## 2019-09-20 NOTE — Consult Note (Signed)
   [facility] Inpatient Consult   09/20/2019  [name] 10/04/1923 [id number]  [place] [facility] [ACO] Patient: Medicare Next Gen  Patient was referred from [employer] follow up calls. Patient evaluated for community based chronic complex disease management services with [facility] [facility] as a benefit of patient's [employer]. The patient qualifies for complex disease management as a benefit and [facility] programs.  Reviewed inpatient Transition of Care team member notes.  Continue to follow for disposition.    Patient will receive post hospital discharge call and will be evaluated for complex disease management.       Of note, [facility] Care Management services does not replace or interfere with any services that are arranged by inpatient case management or social work.  For additional questions or referrals please contact:    Natividad Brood, RN BSN [facility] Liaison  [phone] business mobile phone Toll free office [phone]  Fax number: [phone] [email or web address]  [email or web address]

## 2019-09-20 NOTE — Progress Notes (Signed)
Physical Therapy Treatment Patient Details Name: [name] MRN: [id number] DOB: 1923-11-10 Today's Date: 09/20/2019    History of Present Illness [name] is a 86 yo male presenting with AMS. Upon work-up, imaging revealed large, left-sided MCA acute/early subacute infarct and UA indicates UTI. PMH includes: AS s/p TAVR on 09/06/19, CAD, HTN, chronic thrombocytopenia, hairy cell lymphoma s/p splenectomy, and chronic leukocytosis.    PT Comments    Pt progressing well towards physical therapy goals. Pt pleasant and talkative throughout session and appears very motivated to participate with therapy and return to PLOF. We progressed gait distance this session and feel pt can start moving away from the RW next session. Will continue to follow.     Follow Up Recommendations  Home health PT;Supervision for mobility/OOB     Equipment Recommendations  None recommended by PT    Recommendations for Other Services       Precautions / Restrictions Precautions Precautions: Fall Precaution Comments: is s/p TAVR 7/6 with cardiac monitor still in place Restrictions Weight Bearing Restrictions: No    Mobility  Bed Mobility Overal bed mobility: Needs Assistance Bed Mobility: Supine to Sit     Supine to sit: Supervision     General bed mobility comments: Increased time required. No use of rails. Pt was able to transition to EOB without assistance.   Transfers Overall transfer level: Needs assistance Equipment used: None Transfers: Sit to/from Stand Sit to Stand: Supervision         General transfer comment: Pt demonstrated proper hand placement on seated surface for safety. No assist required and no overt LOB noted.   Ambulation/Gait Ambulation/Gait assistance: Min Gaffer (Feet): 500 Feet Assistive device: Rolling walker (2 wheeled) Gait Pattern/deviations: Step-to pattern;Decreased stride length;Trunk flexed Gait velocity: decreased Gait velocity  interpretation: <1.8 ft/sec, indicate of risk for recurrent falls General Gait Details: Pt occasionally running into obstacles with the walker, likely due to poor attention to task as he was very talkative   Stairs Stairs: Yes Stairs assistance: Min [name] Stair Management: Two rails;Alternating pattern;Step to pattern;Forwards Number of Stairs: 5 General stair comments: Pt demonstrated good sequencing and safety with stair negotiation.    Wheelchair Mobility    Modified Rankin (Stroke Patients Only)       Balance Overall balance assessment: Needs assistance Sitting-balance support: No upper extremity supported;Feet supported Sitting balance-Leahy Scale: Good     Standing balance support: Bilateral upper extremity supported;During functional activity;No upper extremity supported Standing balance-Leahy Scale: Fair Standing balance comment: able to engage in ADLs standing with min [name], preference to BUE support on RW during ambulation                             Cognition Arousal/Alertness: Awake/alert Behavior During Therapy: WFL for tasks assessed/performed Overall Cognitive Status: Within Functional Limits for tasks assessed                                        Exercises      General Comments        Pertinent Vitals/Pain Pain Assessment: No/denies pain    Home Living                      Prior Function            PT Goals (current goals can  now be found in the care plan section) Acute Rehab PT Goals Patient Stated Goal: return home PT Goal Formulation: With patient/family Time For Goal Achievement: 10/01/19 Potential to Achieve Goals: Good Progress towards PT goals: Progressing toward goals    Frequency    Min 3X/week      PT Plan Current plan remains appropriate    Co-evaluation              AM-PAC PT "6 Clicks" Mobility   Outcome Measure  Help needed turning from your back to your side while in  a flat bed without using bedrails?: None Help needed moving from lying on your back to sitting on the side of a flat bed without using bedrails?: None Help needed moving to and from a bed to a chair (including a wheelchair)?: None Help needed standing up from a chair using your arms (e.g., wheelchair or bedside chair)?: None Help needed to walk in hospital room?: None Help needed climbing 3-5 steps with a railing? : None 6 Click Score: 24    End of Session Equipment Utilized During Treatment: Gait belt Activity Tolerance: Patient tolerated treatment well Patient left: with family/visitor present;with call bell/phone within reach (In bathroom) Nurse Communication: Mobility status;Other (comment) (pt in bathroom ) PT Visit Diagnosis: Difficulty in walking, not elsewhere classified (R26.2);Muscle weakness (generalized) (M62.81)     Time: 9147-8295 PT Time Calculation (min) (ACUTE ONLY): 42 min  Charges:  $Gait Training: 38-52 mins                     [name], PT, DPT Acute Rehabilitation Services Pager: [phone] Office: [phone]    [name] 09/20/2019, 11:43 AM

## 2019-09-20 NOTE — Progress Notes (Signed)
Dr Lucita Lora notified via text message regarding the VT.

## 2019-09-20 NOTE — Progress Notes (Addendum)
HEMATOLOGY-ONCOLOGY PROGRESS NOTE  SUBJECTIVE: Reports that he is feeling well overall this afternoon.  He denies active bleeding.  States that he has some difficulty remembering things.  PHYSICAL EXAMINATION:  Vitals:   09/20/19 0748 09/20/19 1136  BP: (!) 157/77 106/66  Pulse: (!) 53 60  Resp: 20 20  Temp: 97.8 F (36.6 C) 97.7 F (36.5 C)  SpO2: 95% 97%   Filed Weights   09/16/19 1221  Weight: 79.5 kg    Intake/Output from previous day: 07/19 0701 - 07/20 0700 In: 480 [P.O.:480] Out: 1900 [Urine:1900]  GENERAL:alert, no distress and comfortable HEENT: No thrush SKIN: Ecchymosis to the groin LUNGS: clear to auscultation and percussion with normal breathing effort HEART: regular rate & rhythm and no murmurs and no lower extremity edema NEURO: alert & oriented x 3 with fluent speech, no focal motor/sensory deficits  LABORATORY DATA:  I have reviewed the data as listed CMP Latest Ref Rng & Units 09/20/2019 09/19/2019 09/18/2019  Glucose 70 - 99 mg/dL 171(H) 169(H) 154(H)  BUN 8 - 23 mg/dL _0 Creatinine 0.61 - 1.24 mg/dL 1.02 1.08 1.23  Sodium 135 - 145 mmol/L 131(L) 133(L) 131(L)  Potassium 3.5 - 5.1 mmol/L 4.4 3.4(L) 3.5  Chloride 98 - 111 mmol/L 100 102 99  CO2 22 - 32 mmol/L _1 Calcium 8.9 - 10.3 mg/dL 8.3(L) 8.3(L) 7.7(L)  Total Protein 6.5 - 8.1 g/dL - - 4.6(L)  Total Bilirubin 0.3 - 1.2 mg/dL - - 0.5  Alkaline Phos 38 - 126 U/L - - 59  AST 15 - 41 U/L - - 19  ALT 0 - 44 U/L - - 21    Lab Results  Component Value Date   WBC 18.3 (H) 09/20/2019   HGB 10.6 (L) 09/20/2019   HCT 32.2 (L) 09/20/2019   MCV 78.2 (L) 09/20/2019   PLT 54 (L) 09/20/2019   NEUTROABS 21.3 (H) 09/17/2019    DG Chest 2 View  Result Date: 09/02/2019 CLINICAL DATA:  Preop evaluation for upcoming aortic surgery EXAM: CHEST - 2 VIEW COMPARISON:  08/12/2019 FINDINGS: Cardiac shadow is mildly enlarged. Stable left upper lobe lung mass is noted. No focal infiltrate is seen.  Degenerative changes of the thoracic spine are noted. IMPRESSION: Stable left upper lobe mass.  No new focal abnormality is noted. Electronically Signed   By: Inez Catalina M.D.   On: 09/02/2019 15:34   DG Chest Port 1 View  Result Date: 09/16/2019 CLINICAL DATA:  Postop fever. EXAM: PORTABLE CHEST 1 VIEW COMPARISON:  Radiograph 09/06/2019, CT 07/29/2019 FINDINGS: Post TAVR. Previous right internal jugular pacer is been removed. Battery pack projects over the left chest wall. Stable cardiomegaly. Aortic atherosclerosis. Central left lung mass is unchanged by radiograph. No evidence of acute airspace disease. No pleural fluid. No pneumothorax. No pulmonary edema. IMPRESSION: 1. No acute abnormality. 2. Stable mild cardiomegaly and left lung mass. Electronically Signed   By: Keith Rake M.D.   On: 09/16/2019 14:02   DG Chest Port 1 View  Result Date: 09/06/2019 CLINICAL DATA:  Status post TAVR EXAM: PORTABLE CHEST 1 VIEW COMPARISON:  09/02/2019 FINDINGS: Status post TAVR. There is a right IJ approach sheath with a pacer lead terminating in the right ventricle. Left chest mass is unchanged. No focal airspace consolidation or pleural effusion. IMPRESSION: Status post TAVR without focal airspace disease. Electronically Signed   By: Ulyses Jarred M.D.   On: 09/06/2019 19:07   ECHOCARDIOGRAM COMPLETE  Result Date:  09/17/2019    ECHOCARDIOGRAM REPORT   Patient Name:   JVEON POUND Date of Exam: 09/17/2019 Medical Rec #:  [id number]     Height:       64.0 in Accession #:    [id number]    Weight:       175.3 lb Date of Birth:  May 05, 1923    BSA:          1.850 m Patient Age:    73 years      BP:           107/56 mmHg Patient Gender: M             HR:           63 bpm. Exam Location:  Inpatient Procedure: 2D Echo, Cardiac Doppler, Color Doppler and Intracardiac            Opacification Agent Indications:    Stroke 434.91 / I163.9  History:        Patient has prior history of Echocardiogram examinations, most                  recent 09/07/2019. CAD, Stroke; Risk Factors:Hypertension,                 Dyslipidemia and Former Smoker. GERD.                 Aortic Valve: 26 mm Edwards Sapien prosthetic, stented (TAVR)                 valve is present in the aortic position. Procedure Date:                 09/06/2019.  Sonographer:    Vickie Epley RDCS Referring Phys: [id number] [place]  1. Left ventricular ejection fraction, by estimation, is 60 to 65%. The left ventricle has normal function. The left ventricle has no regional wall motion abnormalities. Left ventricular diastolic parameters are consistent with Grade I diastolic dysfunction (impaired relaxation). Elevated left atrial pressure.  2. Right ventricular systolic function is normal. The right ventricular size is normal. There is normal pulmonary artery systolic pressure.  3. The mitral valve is normal in structure. Trivial mitral valve regurgitation. No evidence of mitral stenosis.  4. Edwards Sapien 3 THV size 26 mm, model #[id number] is in the AV position. Mildly elevated mean gradient of 25 mmHg based on published normals, has signifcantly increased from prior study 09/07/19. The LVOT/ AV VTI ratio has also decreased. Limited morphological visualization of prosthetic valve in general with TTE, no gross visualized thrombus or vegetation. Given presentation with bacteremia, CVA, and increased gradient across prosthetic valve would recommend TEE for better visualization. . The aortic valve has been repaired/replaced. Aortic valve regurgitation is not visualized. There is a 26 mm Edwards Sapien prosthetic (TAVR) valve present in the aortic position. Procedure Date: 09/06/2019.  5. The inferior vena cava is normal in size with greater than 50% respiratory variability, suggesting right atrial pressure of 3 mmHg. FINDINGS  Left Ventricle: Left ventricular ejection fraction, by estimation, is 60 to 65%. The left ventricle has normal function. The left ventricle has no  regional wall motion abnormalities. Definity contrast agent was given IV to delineate the left ventricular  endocardial borders. The left ventricular internal cavity size was normal in size. There is no left ventricular hypertrophy. Left ventricular diastolic parameters are consistent with Grade I diastolic dysfunction (impaired relaxation). Elevated left atrial pressure. Right Ventricle: The right ventricular size  is normal. No increase in right ventricular wall thickness. Right ventricular systolic function is normal. There is normal pulmonary artery systolic pressure. The tricuspid regurgitant velocity is 1.85 m/s, and  with an assumed right atrial pressure of 3 mmHg, the estimated right ventricular systolic pressure is 16.1 mmHg. Left Atrium: Left atrial size was normal in size. Right Atrium: Right atrial size was normal in size. Pericardium: There is no evidence of pericardial effusion. Mitral Valve: The mitral valve is normal in structure. There is mild thickening of the mitral valve leaflet(s). There is mild calcification of the mitral valve leaflet(s). Mild mitral annular calcification. Trivial mitral valve regurgitation. No evidence  of mitral valve stenosis. Tricuspid Valve: The tricuspid valve is normal in structure. Tricuspid valve regurgitation is mild . No evidence of tricuspid stenosis. Aortic Valve: Edwards Sapien 3 THV size 26 mm, model #[id number] is in the AV position. Mildly elevated mean gradient of 25 mmHg based on published normals, has signifcantly increased from prior study 09/07/19. The LVOT/ AV VTI ratio has also decreased. Limited morphological visualization of prosthetic valve in general with TTE, no gross visualized thrombus or vegetation. Given presentation with bacteremia, CVA, and increased gradient across prosthetic valve would recommend TEE for better visualization.  The aortic valve has been repaired/replaced. Aortic valve regurgitation is not visualized. Aortic valve mean gradient  measures 25.2 mmHg. Aortic valve peak gradient measures 41.5 mmHg. Aortic valve area, by VTI measures 1.88 cm. There is a 26 mm Edwards Sapien prosthetic, stented (TAVR) valve present in the aortic position. Procedure Date: 09/06/2019. Pulmonic Valve: The pulmonic valve was not well visualized. Pulmonic valve regurgitation is mild. No evidence of pulmonic stenosis. Aorta: The aortic root is normal in size and structure. Pulmonary Artery: No evidence of pulmonary HTN, PASP is 27 mmHg. Venous: The inferior vena cava is normal in size with greater than 50% respiratory variability, suggesting right atrial pressure of 3 mmHg. IAS/Shunts: The interatrial septum appears to be lipomatous. No atrial level shunt detected by color flow Doppler.  LEFT VENTRICLE PLAX 2D LVIDd:         3.90 cm      Diastology LVIDs:         2.90 cm      LV e' lateral:   5.27 cm/s LV PW:         0.90 cm      LV E/e' lateral: 19.9 LV IVS:        0.90 cm      LV e' medial:    3.57 cm/s LVOT diam:     2.60 cm      LV E/e' medial:  29.4 LV SV:         132 LV SV Index:   71 LVOT Area:     5.31 cm  LV Volumes (MOD) LV vol d, MOD A2C: 91.4 ml LV vol d, MOD A4C: 116.0 ml LV vol s, MOD A2C: 47.8 ml LV vol s, MOD A4C: 44.2 ml LV SV MOD A2C:     43.6 ml LV SV MOD A4C:     116.0 ml LV SV MOD BP:      61.3 ml RIGHT VENTRICLE RV S prime:     15.70 cm/s TAPSE (M-mode): 2.3 cm LEFT ATRIUM             Index       RIGHT ATRIUM           Index LA diam:  3.40 cm 1.84 cm/m  RA Area:     10.90 cm LA Vol (A2C):   34.7 ml 18.76 ml/m RA Volume:   22.20 ml  12.00 ml/m LA Vol (A4C):   45.3 ml 24.49 ml/m LA Biplane Vol: 41.2 ml 22.27 ml/m  AORTIC VALVE AV Area (Vmax):    1.86 cm AV Area (Vmean):   1.76 cm AV Area (VTI):     1.88 cm AV Vmax:           321.96 cm/s AV Vmean:          237.994 cm/s AV VTI:            0.702 m AV Peak Grad:      41.5 mmHg AV Mean Grad:      25.2 mmHg LVOT Vmax:         113.00 cm/s LVOT Vmean:        78.700 cm/s LVOT VTI:           0.249 m LVOT/AV VTI ratio: 0.35  AORTA Ao Root diam: 3.00 cm MITRAL VALVE                TRICUSPID VALVE MV Area (PHT): 2.23 cm     TR Peak grad:   13.7 mmHg MV Decel Time: 340 msec     TR Vmax:        185.00 cm/s MV E velocity: 105.00 cm/s MV A velocity: 129.00 cm/s  SHUNTS MV E/A ratio:  0.81         Systemic VTI:  0.25 m                             Systemic Diam: 2.60 cm Carlyle Dolly MD Electronically signed by Carlyle Dolly MD Signature Date/Time: 09/17/2019/2:05:41 PM    Final    CT HEAD CODE STROKE WO CONTRAST  Result Date: 09/16/2019 CLINICAL DATA:  Code stroke. Possible stroke, neuro deficit, subacute. Right-sided facial droop, aphasia, last known well 10:00 AM EXAM: CT HEAD WITHOUT CONTRAST TECHNIQUE: Contiguous axial images were obtained from the base of the skull through the vertex without intravenous contrast. COMPARISON:  No pertinent prior studies available for comparison. FINDINGS: Brain: Mild generalized parenchymal atrophy. There is abnormal cortical/subcortical hyperdensity within the mid left frontal lobe and left frontal operculum consistent with acute/early subacute infarction. The infarct measures 2.4 x 3.7 x 3.5 cm. There is mild regional mass effect. No ventricular effacement or midline shift. No evidence of hemorrhagic conversion. Background mild chronic small vessel ischemic disease. No extra-axial fluid collection. No evidence of intracranial mass. No midline shift. Vascular: No definite hyperdense vessel. Atherosclerotic calcifications. Skull: Normal. Negative for fracture or focal lesion. Sinuses/Orbits: Visualized orbits show no acute finding. Bilateral lens replacements. Mild ethmoid and maxillary sinus mucosal thickening. No significant mastoid effusion. ASPECTS ([place] Stroke Program Early CT Score) - Ganglionic level infarction (caudate, lentiform nuclei, internal capsule, insula, M1-M3 cortex): 7 - Supraganglionic infarction (M4-M6 cortex): 1 Total score (0-10 with 10  being normal): 8 These results were called by telephone at the time of interpretation on 09/16/2019 at 12:06 pm to provider Dr. Rory Percy, who verbally acknowledged these results. IMPRESSION: 1. Acute/early subacute ischemic infarction within the left MCA vascular territory within the mid left frontal lobe and left frontal operculum. ASPECTS is 8. Mild regional mass effect. No midline shift. No hemorrhagic conversion. 2. Background mild generalized parenchymal atrophy and chronic small vessel ischemic disease 3. Mild ethmoid sinus  mucosal thickening. Electronically Signed   By: Kellie Simmering DO   On: 09/16/2019 12:06   VAS [place] TRANSCRANIAL DOPPLER  Result Date: 09/20/2019  Transcranial Doppler Indications: Stroke. Performing Technologist: Maudry Mayhew MHA, RDMS, RVT, RDCS  Examination Guidelines: A complete evaluation includes B-mode imaging, spectral Doppler, color Doppler, and power Doppler as needed of all accessible portions of each vessel. Bilateral testing is considered an integral part of a complete examination. Limited examinations for reoccurring indications may be performed as noted.  +----------+-------------+----------+-----------+-------+ RIGHT TCD Right VM (cm)Depth (cm)PulsatilityComment +----------+-------------+----------+-----------+-------+ MCA           16.00                 1.53            +----------+-------------+----------+-----------+-------+ ACA          -21.00                 1.03            +----------+-------------+----------+-----------+-------+ PCA           20.00                 1.15            +----------+-------------+----------+-----------+-------+ Opthalmic     15.00                 1.08            +----------+-------------+----------+-----------+-------+ ICA siphon    18.00                 1.68            +----------+-------------+----------+-----------+-------+ Vertebral    -14.00                 1.38             +----------+-------------+----------+-----------+-------+  +----------+------------+----------+-----------+-------+ LEFT TCD  Left VM (cm)Depth (cm)PulsatilityComment +----------+------------+----------+-----------+-------+ MCA          31.00                 1.40            +----------+------------+----------+-----------+-------+ ACA          -14.00                1.22            +----------+------------+----------+-----------+-------+ PCA          11.00                 0.95            +----------+------------+----------+-----------+-------+ Opthalmic    15.00                 0.83            +----------+------------+----------+-----------+-------+ ICA siphon   21.00                 1.39            +----------+------------+----------+-----------+-------+ Vertebral    -19.00                1.53            +----------+------------+----------+-----------+-------+  Summary:  Suboptimal waveforms throughout due to poor bony windows throughout limit study. antegrade flow noted in all vessels identified. Globally elevated pulsatility indices along with low mean flow velocities throughout suggest diffus eintracranial atherosclerosis. *See table(s) above for TCD measurements and observations.  Diagnosing physician: Antony Contras  MD Electronically signed by Antony Contras MD on 09/20/2019 at 11:48:00 AM.    Final    ECHOCARDIOGRAM LIMITED  Result Date: 09/07/2019    ECHOCARDIOGRAM REPORT   Patient Name:   SHAYDEN BOBIER Date of Exam: 09/07/2019 Medical Rec #:  [id number]     Height:       64.0 in Accession #:    [id number]    Weight:       170.3 lb Date of Birth:  August 22, 1923    BSA:          1.827 m Patient Age:    39 years      BP:           80/50 mmHg Patient Gender: M             HR:           74 bpm. Exam Location:  Inpatient Procedure: Limited Echo, Intracardiac Opacification Agent, Limited Color Doppler            and Cardiac Doppler Indications:    Post TAVR evaluation V43.3 / Z95.2   History:        Patient has prior history of Echocardiogram examinations, most                 recent 09/06/2019. CAD, Arrythmias:RBBB; Risk Factors:Dyslipidemia                 and Sleep Apnea. History of cancer. GERD. Fever. Urinary                 retention.                 Aortic Valve: 26 mm Ultra, stented (TAVR) valve is present in                 the aortic position. Procedure Date: 09/06/2019.  Sonographer:    Darlina Sicilian RDCS Referring Phys: [id number] [place]  Sonographer Comments: Gradients obtained when hypotensive IMPRESSIONS  1. Day 1 post TAVR with normal transaortic gradients with peak/mean 18/11 mmHg. Trivial paravalvular leak.  2. Left ventricular ejection fraction, by estimation, is 65 to 70%. The left ventricle has normal function. The left ventricle has no regional wall motion abnormalities. Left ventricular diastolic function could not be evaluated.  3. Right ventricular systolic function is normal. The right ventricular size is normal. There is normal pulmonary artery systolic pressure.  4. The mitral valve is normal in structure. Trivial mitral valve regurgitation. No evidence of mitral stenosis.  5. The aortic valve has been repaired/replaced. Aortic valve regurgitation is not visualized. No aortic stenosis is present. There is a 26 mm Ultra, stented (TAVR) valve present in the aortic position. Procedure Date: 09/06/2019. Echo findings are consistent with normal structure and function of the aortic valve prosthesis. Aortic valve mean gradient measures 11.0 mmHg.  6. The inferior vena cava is normal in size with greater than 50% respiratory variability, suggesting right atrial pressure of 3 mmHg. FINDINGS  Left Ventricle: Left ventricular ejection fraction, by estimation, is 65 to 70%. The left ventricle has normal function. The left ventricle has no regional wall motion abnormalities. Definity contrast agent was given IV to delineate the left ventricular  endocardial borders. The  left ventricular internal cavity size was normal in size. There is no left ventricular hypertrophy. Left ventricular diastolic function could not be evaluated. Right Ventricle: The right ventricular size is normal. No increase in right ventricular wall thickness. Right ventricular systolic function  is normal. There is normal pulmonary artery systolic pressure. Left Atrium: Left atrial size was normal in size. Right Atrium: Right atrial size was normal in size. Pericardium: Trivial pericardial effusion is present. There is no evidence of cardiac tamponade. Mitral Valve: The mitral valve is normal in structure. Normal mobility of the mitral valve leaflets. Trivial mitral valve regurgitation. No evidence of mitral valve stenosis. Tricuspid Valve: The tricuspid valve is normal in structure. Tricuspid valve regurgitation is trivial. No evidence of tricuspid stenosis. Aortic Valve: The aortic valve has been repaired/replaced. Aortic valve regurgitation is not visualized. No aortic stenosis is present. Aortic valve mean gradient measures 11.0 mmHg. Aortic valve peak gradient measures 18.2 mmHg. Aortic valve area, by VTI measures 3.75 cm. There is a 26 mm Ultra, stented (TAVR) valve present in the aortic position. Procedure Date: 09/06/2019. Echo findings are consistent with normal structure and function of the aortic valve prosthesis. Pulmonic Valve: The pulmonic valve was normal in structure. Pulmonic valve regurgitation is not visualized. No evidence of pulmonic stenosis. Aorta: The aortic root is normal in size and structure. Venous: The inferior vena cava is normal in size with greater than 50% respiratory variability, suggesting right atrial pressure of 3 mmHg. IAS/Shunts: No atrial level shunt detected by color flow Doppler.  LEFT VENTRICLE PLAX 2D LVOT diam:     2.35 cm LV SV:         102 LV SV Index:   56 LVOT Area:     4.34 cm  AORTIC VALVE AV Area (Vmax):    3.38 cm AV Area (Vmean):   3.56 cm AV Area (VTI):      3.75 cm AV Vmax:           213.33 cm/s AV Vmean:          139.000 cm/s AV VTI:            0.272 m AV Peak Grad:      18.2 mmHg AV Mean Grad:      11.0 mmHg LVOT Vmax:         166.00 cm/s LVOT Vmean:        114.000 cm/s LVOT VTI:          0.235 m LVOT/AV VTI ratio: 0.87  SHUNTS Systemic VTI:  0.24 m Systemic Diam: 2.35 cm Ena Dawley MD Electronically signed by Ena Dawley MD Signature Date/Time: 09/07/2019/10:32:21 PM    Final    ECHOCARDIOGRAM LIMITED  Result Date: 09/06/2019    ECHOCARDIOGRAM LIMITED REPORT   Patient Name:   AADI BORDNER Date of Exam: 09/06/2019 Medical Rec #:  [id number]     Height:       64.0 in Accession #:    [id number]    Weight:       170.8 lb Date of Birth:  04-20-1923    BSA:          1.829 m Patient Age:    55 years      BP:           207/88 mmHg Patient Gender: M             HR:           52 bpm. Exam Location:  Inpatient Procedure: Limited Echo, Color Doppler and Cardiac Doppler Indications:     Aortic Stenosis i35.0  History:         Patient has prior history of Echocardiogram examinations, most  recent 06/13/2019. CAD; Risk Factors:Hypertension and                  Dyslipidemia.  Sonographer:     Raquel Sarna Senior RDCS Referring Phys:  [place] Diagnosing Phys: Ena Dawley MD  Sonographer Comments: TAVR Procedure for implantation of 64m Edwards Ultra Sapien Bioprosthetic IMPRESSIONS  1. This was a periprocedural TTE during a TAVR procedure. A 26 mm Edwards-SAPIEN 3 Ultra valve was successfully deployed in the aortic position with improvement of peak/mean transaortic gradients from 60/37 mmHg to 13/7 mmHg. No paravalvular leak and no  pericardial effusion prior or post procedure.  2. Left ventricular ejection fraction, by estimation, is 60 to 65%. The left ventricle has normal function. The left ventricle has no regional wall motion abnormalities. There is mild concentric left ventricular hypertrophy.  3. Right ventricular systolic function  is normal. The right ventricular size is normal. There is normal pulmonary artery systolic pressure.  4. The mitral valve is normal in structure. Mild mitral valve regurgitation. No evidence of mitral stenosis.  5. The aortic valve is normal in structure. Aortic valve regurgitation is moderate. Severe aortic valve stenosis. Aortic valve mean gradient measures 37.0 mmHg.  6. The inferior vena cava is normal in size with greater than 50% respiratory variability, suggesting right atrial pressure of 3 mmHg. FINDINGS  Left Ventricle: Left ventricular ejection fraction, by estimation, is 60 to 65%. The left ventricle has normal function. The left ventricle has no regional wall motion abnormalities. The left ventricular internal cavity size was normal in size. There is  mild concentric left ventricular hypertrophy. Right Ventricle: The right ventricular size is normal. No increase in right ventricular wall thickness. Right ventricular systolic function is normal. There is normal pulmonary artery systolic pressure. Left Atrium: Left atrial size was normal in size. Right Atrium: Right atrial size was normal in size. Pericardium: There is no evidence of pericardial effusion. Mitral Valve: The mitral valve is normal in structure. Normal mobility of the mitral valve leaflets. Mild mitral valve regurgitation. No evidence of mitral valve stenosis. Tricuspid Valve: The tricuspid valve is normal in structure. Tricuspid valve regurgitation is not demonstrated. No evidence of tricuspid stenosis. Aortic Valve: The aortic valve is normal in structure.. There is severe thickening and severe calcifcation of the aortic valve. Aortic valve regurgitation is moderate. Severe aortic stenosis is present. There is severe thickening of the aortic valve. There is severe calcifcation of the aortic valve. Aortic valve mean gradient measures 37.0 mmHg. Aortic valve peak gradient measures 13.1 mmHg. Aortic valve area, by VTI measures 1.75 cm.  Pulmonic Valve: The pulmonic valve was normal in structure. Pulmonic valve regurgitation is not visualized. No evidence of pulmonic stenosis. Aorta: The aortic root is normal in size and structure. Venous: The inferior vena cava is normal in size with greater than 50% respiratory variability, suggesting right atrial pressure of 3 mmHg. IAS/Shunts: No atrial level shunt detected by color flow Doppler.  LEFT VENTRICLE PLAX 2D LVOT diam:     2.00 cm LV SV:         69 LV SV Index:   38 LVOT Area:     3.14 cm  AORTIC VALVE AV Area (Vmax):    1.91 cm AV Area (Vmean):   2.12 cm AV Area (VTI):     1.75 cm AV Vmax:           181.00 cm/s AV Vmean:          125.000 cm/s  AV VTI:            0.394 m AV Peak Grad:      13.1 mmHg AV Mean Grad:      37.0 mmHg LVOT Vmax:         110.00 cm/s LVOT Vmean:        84.300 cm/s LVOT VTI:          0.220 m LVOT/AV VTI ratio: 0.56  SHUNTS Systemic VTI:  0.22 m Systemic Diam: 2.00 cm Ena Dawley MD Electronically signed by Ena Dawley MD Signature Date/Time: 09/06/2019/2:39:05 PM    Final    [place] EKG SITE RITE  Result Date: 09/20/2019 If Site Rite image not attached, placement could not be confirmed due to current cardiac rhythm.  Structural Heart Procedure  Result Date: 09/06/2019 See surgical note for result.   ASSESSMENT AND PLAN: 1. Thrombocytopenia ? Bone marrow biopsy 07/08/2016-no evidence of B-cell lymphoma, 40% cellular bone marrow with trilineage hematopoiesis, megakaryocytes present with normal morphology, normal cytogenetics, negative for BRAF mutation ? Flow cytometry 01/05/2009-no monoclonal B-cell or phenotypically abnormal T-cell population ? Prednisone starting 06/29/2019 ? Nplate 07/04/2019, 6/97/9480,XKPVVZ 07/18/2019 ? Prednisone 20 mg daily beginning 07/25/2019 ? Platelets 11,000 on 08/15/2019, prednisone increased to 40 mg daily, Nplate resume 4/82/7078 ? Prednisone decreased to 30 mg daily 08/25/2019, Nplate continued ? Prednisone continued at 30 mg  daily 09/13/2019, weekly Nplate continued 2. Hairy cell leukemia 1982, status post splenectomy 3. Coronary artery disease 4. Aortic stenosis 5. 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 Macular degeneration 7. Hearing loss 8. Basal cell carcinoma left cheek 05/24/2019 9. Left upper lobe nodule, faint FDG activity on PET at [facility] 05/31/2013 10. 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 Herren is now admitted due to sepsis from Pseudomonas UTI and acute left frontal lobe stroke in the left MCA territory.  He reports that he is feeling much better today.  He still has bruising but no active bleeding.  He has leukocytosis likely due in part to infection and prior splenectomy.  His WBC is trending downward.  Hemoglobin remained stable.  He has persistent thrombocytopenia and platelet count today is 54,000.  He has no active bleeding.  Recommendations: 1.  Okay from our standpoint to place on aspirin 81 mg daily. 2.  Plan to administer Nplate this Friday when it is due.  Our goal is to keep platelet count above 50,000. 3.  Continue prednisone at the current dose, plan to taper as an outpatient   LOS: 4 days   Mikey Bussing, DNP, AGPCNP-BC, AOCNP 09/20/19 Mr. Mozley was interviewed and examined.  He was admitted 09/16/2019 with a CVA and urosepsis.  It is unclear whether the CVA was related to atrial fibrillation, endocarditis, or the heart valve procedure.  He has been off of aspirin for only 2 days prior to hospital admission.  He appears well today.  The platelet count has stabilized in the 50-60000 range.  He is a candidate to continue aspirin therapy, but I am concerned he may have "failed "aspirin.  We will continue prednisone and Nplate in an attempt to keep the platelet count above 50,000.  He could receive a DOAC with the platelet count in this range.  I will defer the decision on continuing aspirin versus switching to anticoagulation therapy to the neurology service.  The  leukocytosis is secondary to steroids, bacteremia, and the splenectomy.

## 2019-09-20 NOTE — Progress Notes (Signed)
CCMD called and reported patient has 4 beat run of VT.  Patient is asleep,  No complaints.

## 2019-09-20 NOTE — Significant Event (Addendum)
HOSPITAL MEDICINE OVERNIGHT EVENT NOTE  Notified by nursing that patient exhibited a 4 beat run of ventricular tachycardia.  Patient was asymptomatic at that time.  Chart reviewed, patient noted to be hypokalemic this morning with potassium of 3.4.  Will repeat chemistry and magnesium now and replace any electrolyte derangements.  Continue to monitor on telemetry.  Sherryll Burger Usiel Astarita   ADDENDUM  Magnesium found to be 1.6.  2 grams IV magnesium sulfate ordered.  Sherryll Burger Alania Overholt

## 2019-09-20 NOTE — TOC Progression Note (Addendum)
Transition of Care [facility]) - Progression Note    Patient Details  Name: [name] MRN: [id number] Date of Birth: [birth year]  Transition of Care [facility] CM/SW [place], [place] Phone Number: 09/20/2019, 11:08 AM  Clinical Narrative:   CSW met with patient's wife at bedside to discuss home health. Wife agreeable that she could utilize some support at home, has been happy with [facility] in the past. Wife would like to make sure that they are able to come out and see the patient when he goes home instead of being on her own for days before someone comes out. Wife is also familiar with going home with picc line and IV antibiotics, she has done it before when she was younger. CSW explained getting patient set up with [facility] for assistance with antibiotics. CSW to follow.  UPDATE: Referral given to [facility], Pam will follow up with patient's wife. Referral given to [facility] will update CSW with decision with regards to staffing.     Expected Discharge Plan: [place] Barriers to Discharge: Continued Medical Work up  Expected Discharge Plan and Services Expected Discharge Plan: [place] Choice: NA Living arrangements for the past 2 months: Single Family Home                                       Social Determinants of Health (SDOH) Interventions    Readmission Risk Interventions Readmission Risk Prevention Plan 09/09/2019  Post Dischage Appt Complete  Medication Screening Complete  Transportation Screening Complete  Some recent data might be hidden

## 2019-09-20 NOTE — Progress Notes (Signed)
Peripherally Inserted Central Catheter Placement  The IV Nurse has discussed with the patient and/or persons authorized to consent for the patient, the purpose of this procedure and the potential benefits and risks involved with this procedure.  The benefits include less needle sticks, lab draws from the catheter, and the patient may be discharged home with the catheter. Risks include, but not limited to, infection, bleeding, blood clot (thrombus formation), and puncture of an artery; nerve damage and irregular heartbeat and possibility to perform a PICC exchange if needed/ordered by physician.  Alternatives to this procedure were also discussed.  Bard Power PICC patient education guide, fact sheet on infection prevention and patient information card has been provided to patient /or left at bedside.    PICC Placement Documentation  PICC Single Lumen 07/86/75 PICC Right Basilic 39 cm 0 cm (Active)  Indication for Insertion or Continuance of Line Home intravenous therapies (PICC only) 09/20/19 1545  Exposed Catheter (cm) 0 cm 09/20/19 1545  Site Assessment Clean;Dry;Intact 09/20/19 1545  Line Status Flushed;Blood return noted 09/20/19 1545  Dressing Type Transparent 09/20/19 1545  Dressing Status Clean;Dry;Intact;Antimicrobial disc in place;Other (Comment) 09/20/19 1545  Dressing Intervention New dressing 09/20/19 1545  Dressing Change Due 09/27/19 09/20/19 1545    Consent signed by Elnita Maxwell   Christella Noa Albarece 09/20/2019, 3:47 PM

## 2019-09-20 NOTE — Progress Notes (Signed)
[name]  SFK:[id number] DOB: 10/26/1923 DOA: 09/16/2019 PCP: [name], MD    Brief Narrative:  86 year old with a history of CAD, aortic stenosis status post TAVR 09/06/2019, RBBB, LAFB, urinary retention with frequent UTIs, chronic diastolic CHF, colon cancer, chronic thrombocytopenia, hairy cell lymphoma status post splenectomy, chronic leukocytosis, and BPH who presented to the ED with altered mental status.  The patient was doing well status post TAVR.  His postoperative aspirin had to be discontinued due to large bruises and hematoma of the right groin with a low platelet count.  The patient developed the abrupt onset of chills and urinary retention.  His wife found his temperature to be 101.  The patient visited his urologist office where a Foley catheter had to be placed and while there he suffered the acute onset of a right facial droop and acute aphasia.  He was sent to the ED via EMS.  In the ED CT confirmed a large left-sided MCA acute versus subacute CVA.  UA was also consistent with a UTI.  Exam in the ED however revealed no focal neurologic deficits.  Antimicrobials:  Rocephin 7/16 Cefepime 7/17 >  Subjective: Sitting up in a bedside chair.  In good spirits.  Getting about the room some.  Denies chest pain shortness of breath fever chills nausea or vomiting.  He and his wife and I had a lengthy discussion about our intended treatment course.  Assessment & Plan:  Sepsis due to Pseudomonas urinary tract infection (POA)  + Pseudomonas bacteremia Pan-sensitive on testing - sepsis physiology resolved -ID now suggesting placement of PICC line to complete 4 weeks of antipseudomonal IV antibiotic therapy with follow-up in the ID clinic with Dr. Megan Salon -TEE not felt to be necessary or appropriate  Acute left frontal lobe stroke - L MCA territory  Workup as per Stroke Team - MRI/MRA not able to be accomplished due to Zio patch pt is wearing - LDL 43 - A1c 6.6 - therapy suggests  HHPT and OT and no need for SLP - TTE noted EF 60-65% w/ grade 1 DD w/ no gross evidence of embolic source - ASA tx had to be held as of 7/13 due to bleeding/bruising of groin and low plt count - Plavix dosed x2 since admit but since stopped - Stroke Team does not feel he is a candidate for DAPT or anticoag - suggestion is for ASA 81mg  QD which has been approved by his Hematologist   Acute kidney injury Creatinine 1.01 at time of discharge 09/09/2019 -1.72 at presentation - most c/w simple prerenal state -creatinine has now normalized with gentle volume resuscitation  Recent Labs  Lab 09/16/19 1151 09/17/19 0752 09/18/19 0511 09/19/19 0838 09/20/19 0234  CREATININE 1.60* 1.51* 1.23 1.08 1.02    Aortic stenosis status post TAVR 09/06/2019 Discharged home on aspirin alone given chronic thrombocytopenia, and this had to be stopped in f/u w/ his Hematologist due to declining Plt count and bleeding into groin related to recent TAVR - valve appears safe/normally functioning per Cards team   Paroxysmal newly diagnosed atrial fibrillation Patient was sent home post TAVR with a cardiac monitor patch which did confirm intermittent episodes of atrial fibrillation - cardiology was following to determine arrhythmia burden to make a difficult decision regarding anticoagulation in the setting of chronic thrombocytopenia - Cards attending to this issue   Recurrent urinary retention Required discharge home with Foley catheter - subsequently removed 7/14 with recurrent urinary retention necessitating replacement of catheter 7/16 at  urologist office -Foley catheter to remain with patient to follow-up with Dr. Junious Silk at [facility] urology for timing of next voiding trial  Chronic thrombocytopenia Followed by Dr. Benay Spice - on weekly Nplate tx (last 1/02) and chronic prednisone (since 07/25/19) -the patient and his wife confirm that he did not get his Nplate 7/16 therefore it was dosed 7/18 as inpatient   Chronic  leukocytosis WBC 26 at time of discharge 09/09/2019 -perhaps this is simply related to his chronic prednisone therapy  CAD Cardiac cath 5/25 noted chronically occluded mid LAD, moderate disease in the diagonals and mid RCA -medical management  Chronic diastolic CHF No gross volume overload evident presently with patient actually dehydrated at presentation  Mild hyponatremia Sodium stable in follow-up   Hypomagnesemia Replace and follow-up in a.m.  Mild hypokalemia Supplemented to target range  Microcytic anemia No evidence of current ongoing blood loss to a significant extent -hemoglobin stable  HLD Continue usual medical therapy  HTN Blood pressure trending upward -gently adjust treatment and follow  Elevated hemoglobin A1c A1c 6.6 at presentation - monitor CBG -likely a simple consequence of steroid therapy -would not pursue medical therapy or strict dietary restriction given advanced age unless extremes of CBG are encountered   DVT prophylaxis: SCDs Code Status: FULL CODE Family Communication:  Status is: Inpatient  Remains inpatient appropriate because:IV treatments appropriate due to intensity of illness or inability to take PO   Dispo: The patient is from: Home              Anticipated d/c is to: Home              Anticipated d/c date is: 3 days              Patient currently is not medically stable to d/c.    Consultants:  Stroke Team Cardiology Infectious Disease  Objective: Blood pressure (!) 157/77, pulse (!) 53, temperature 97.8 F (36.6 C), temperature source Oral, resp. rate 20, height 5\' 4"  (1.626 m), weight 79.5 kg, SpO2 95 %.  Intake/Output Summary (Last 24 hours) at 09/20/2019 0946 Last data filed at 09/19/2019 1847 Gross per 24 hour  Intake 240 ml  Output 600 ml  Net -360 ml   Filed Weights   09/16/19 1221  Weight: 79.5 kg    Examination: General: NAD Lungs: CTA B  Cardiovascular: RRR  Abdomen: NT/ND, soft, BS positive, no  rebound Extremities: trace edema B LE   CBC: Recent Labs  Lab 09/13/19 1102 09/13/19 1102 09/16/19 1146 09/16/19 1151 09/17/19 0752 09/17/19 0752 09/18/19 0511 09/19/19 0838 09/20/19 0234  WBC 19.7*  --  36.1*   < > 26.2*   < > 25.3* 19.9* 18.3*  NEUTROABS 12.7*  --  28.3*  --  21.3*  --   --   --   --   HGB 11.2*  --  10.6*   < > 10.4*   < > 10.1* 10.5* 10.6*  HCT 34.4*   < > 33.6*   < > 32.7*   < > 31.0* 33.1* 32.2*  MCV 80.6   < > 81.6   < > 79.4*   < > 78.1* 78.3* 78.2*  PLT 56*  --  62*   < > 73*   < > 57* 60* 54*   < > = values in this interval not displayed.   Basic Metabolic Panel: Recent Labs  Lab 09/18/19 0511 09/19/19 0838 09/20/19 0234  NA 131* 133* 131*  K 3.5 3.4* 4.4  CL 99 102 100  CO2 23 23 23   GLUCOSE 154* 169* 171*  BUN 22 19 23   CREATININE 1.23 1.08 1.02  CALCIUM 7.7* 8.3* 8.3*  MG  --   --  1.6*   GFR: Estimated Creatinine Clearance: 43.1 mL/min (by C-G formula based on SCr of 1.02 mg/dL).  Liver Function Tests: Recent Labs  Lab 09/16/19 1146 09/18/19 0511  AST 24 19  ALT 27 21  ALKPHOS 51 59  BILITOT 1.3* 0.5  PROT 4.9* 4.6*  ALBUMIN 2.8* 2.2*    Coagulation Profile: Recent Labs  Lab 09/16/19 1146  INR 1.1    HbA1C: Hgb A1c MFr Bld  Date/Time Value Ref Range Status  09/17/2019 07:52 AM 6.6 (H) 4.8 - 5.6 % Final    Comment:    (NOTE) Pre diabetes:          5.7%-6.4%  Diabetes:              >6.4%  Glycemic control for   <7.0% adults with diabetes   09/02/2019 08:47 AM 6.5 (H) 4.8 - 5.6 % Final    Comment:    (NOTE) Pre diabetes:          5.7%-6.4%  Diabetes:              >6.4%  Glycemic control for   <7.0% adults with diabetes     CBG: Recent Labs  Lab 09/16/19 1146  GLUCAP 78    Recent Results (from the past 240 hour(s))  Urine culture     Status: Abnormal   Collection Time: 09/16/19 12:30 PM   Specimen: In/Out Cath Urine  Result Value Ref Range Status   Specimen Description IN/OUT CATH URINE   Final   Special Requests   Final    NONE Performed at [facility], [address], [place], [place] [address]    Culture >=100,000 COLONIES/mL PSEUDOMONAS AERUGINOSA (A)  Final   Report Status 09/18/2019 FINAL  Final   Organism ID, Bacteria PSEUDOMONAS AERUGINOSA (A)  Final      Susceptibility   Pseudomonas aeruginosa - MIC*    CEFTAZIDIME 2 SENSITIVE Sensitive     CIPROFLOXACIN 0.5 SENSITIVE Sensitive     GENTAMICIN <=1 SENSITIVE Sensitive     IMIPENEM 2 SENSITIVE Sensitive     PIP/TAZO <=4 SENSITIVE Sensitive     CEFEPIME 2 SENSITIVE Sensitive     * >=100,000 COLONIES/mL PSEUDOMONAS AERUGINOSA  Blood Culture (routine x 2)     Status: Abnormal   Collection Time: 09/16/19 12:58 PM   Specimen: BLOOD LEFT HAND  Result Value Ref Range Status   Specimen Description BLOOD LEFT HAND  Final   Special Requests   Final    BOTTLES DRAWN AEROBIC AND ANAEROBIC Blood Culture results may not be optimal due to an inadequate volume of blood received in culture bottles   Culture  Setup Time   Final    GRAM NEGATIVE RODS AEROBIC BOTTLE ONLY CRITICAL VALUE NOTED.  VALUE IS CONSISTENT WITH PREVIOUSLY REPORTED AND CALLED VALUE.    Culture (A)  Final    PSEUDOMONAS AERUGINOSA SUSCEPTIBILITIES PERFORMED ON PREVIOUS CULTURE WITHIN THE LAST 5 DAYS. Performed at [name], [place] [address] [place], [place] [address]    Report Status 09/19/2019 FINAL  Final  Blood Culture (routine x 2)     Status: Abnormal   Collection Time: 09/16/19  1:00 PM   Specimen: BLOOD  Result Value Ref Range Status   Specimen Description BLOOD  RIGHT ANTECUBITAL  Final   Special Requests   Final    BOTTLES DRAWN AEROBIC AND ANAEROBIC Blood Culture adequate volume   Culture  Setup Time   Final    GRAM NEGATIVE RODS IN BOTH AEROBIC AND ANAEROBIC BOTTLES CRITICAL RESULT CALLED TO, READ BACK BY AND VERIFIED WITH: Rubie Maid [id number] MLM Performed at [facility], [place] [address], [place], [place]  [address]    Culture PSEUDOMONAS AERUGINOSA (A)  Final   Report Status 09/19/2019 FINAL  Final   Organism ID, Bacteria PSEUDOMONAS AERUGINOSA  Final      Susceptibility   Pseudomonas aeruginosa - MIC*    CEFTAZIDIME 2 SENSITIVE Sensitive     CIPROFLOXACIN <=0.25 SENSITIVE Sensitive     GENTAMICIN <=1 SENSITIVE Sensitive     IMIPENEM <=0.25 SENSITIVE Sensitive     PIP/TAZO <=4 SENSITIVE Sensitive     CEFEPIME 2 SENSITIVE Sensitive     * PSEUDOMONAS AERUGINOSA  Blood Culture ID Panel (Reflexed)     Status: Abnormal   Collection Time: 09/16/19  1:00 PM  Result Value Ref Range Status   Enterococcus species NOT DETECTED NOT DETECTED Final   Listeria monocytogenes NOT DETECTED NOT DETECTED Final   Staphylococcus species NOT DETECTED NOT DETECTED Final   Staphylococcus aureus (BCID) NOT DETECTED NOT DETECTED Final   Streptococcus species NOT DETECTED NOT DETECTED Final   Streptococcus agalactiae NOT DETECTED NOT DETECTED Final   Streptococcus pneumoniae NOT DETECTED NOT DETECTED Final   Streptococcus pyogenes NOT DETECTED NOT DETECTED Final   Acinetobacter baumannii NOT DETECTED NOT DETECTED Final   Enterobacteriaceae species NOT DETECTED NOT DETECTED Final   Enterobacter cloacae complex NOT DETECTED NOT DETECTED Final   Escherichia coli NOT DETECTED NOT DETECTED Final   Klebsiella oxytoca NOT DETECTED NOT DETECTED Final   Klebsiella pneumoniae NOT DETECTED NOT DETECTED Final   Proteus species NOT DETECTED NOT DETECTED Final   Serratia marcescens NOT DETECTED NOT DETECTED Final   Carbapenem resistance NOT DETECTED NOT DETECTED Final   Haemophilus influenzae NOT DETECTED NOT DETECTED Final   Neisseria meningitidis NOT DETECTED NOT DETECTED Final   Pseudomonas aeruginosa DETECTED (A) NOT DETECTED Final    Comment: CRITICAL RESULT CALLED TO, READ BACK BY AND VERIFIED WITH: PHARMD T DANG [id number] MLM    Candida albicans NOT DETECTED NOT DETECTED Final   Candida glabrata NOT DETECTED NOT  DETECTED Final   Candida krusei NOT DETECTED NOT DETECTED Final   Candida parapsilosis NOT DETECTED NOT DETECTED Final   Candida tropicalis NOT DETECTED NOT DETECTED Final    Comment: Performed at [facility], [place] [address] [place], [place] [address]  SARS Coronavirus 2 by RT PCR (hospital order, performed in [facility] hospital lab) Nasopharyngeal Nasopharyngeal Swab     Status: None   Collection Time: 09/16/19  1:33 PM   Specimen: Nasopharyngeal Swab  Result Value Ref Range Status   SARS Coronavirus 2 NEGATIVE NEGATIVE Final    Comment: (NOTE) SARS-CoV-2 target nucleic acids are NOT DETECTED.  The SARS-CoV-2 RNA is generally detectable in upper and lower respiratory specimens during the acute phase of infection. The lowest concentration of SARS-CoV-2 viral copies this assay can detect is 250 copies / mL. A negative result does not preclude SARS-CoV-2 infection and should not be used as the sole basis for treatment or other patient management decisions.  A negative result may occur with improper specimen collection / handling, submission of specimen other than nasopharyngeal swab, presence  of viral mutation(s) within the areas targeted by this assay, and inadequate number of viral copies (<250 copies / mL). A negative result must be combined with clinical observations, patient history, and epidemiological information.  Fact Sheet for Patients:   StrictlyIdeas.no  Fact Sheet for Healthcare Providers: BankingDealers.co.za  This test is not yet approved or  cleared by the [place] FDA and has been authorized for detection and/or diagnosis of SARS-CoV-2 by FDA under an Emergency Use Authorization (EUA).  This EUA will remain in effect (meaning this test can be used) for the duration of the COVID-19 declaration under Section 564(b)(1) of the Act, 21 U.S.C. section 360bbb-3(b)(1), unless the authorization is terminated  or revoked sooner.  Performed at [facility], [place] [address], [place], [place] [address]   Culture, blood (Routine X 2) w Reflex to ID Panel     Status: None (Preliminary result)   Collection Time: 09/19/19  3:09 PM   Specimen: BLOOD LEFT HAND  Result Value Ref Range Status   Specimen Description BLOOD LEFT HAND  Final   Special Requests   Final    BOTTLES DRAWN AEROBIC ONLY Blood Culture adequate volume   Culture   Final    NO GROWTH < 24 HOURS Performed at [facility], [place] [address], [place], [place] [address]    Report Status PENDING  Incomplete  Culture, blood (Routine X 2) w Reflex to ID Panel     Status: None (Preliminary result)   Collection Time: 09/19/19  3:09 PM   Specimen: BLOOD RIGHT HAND  Result Value Ref Range Status   Specimen Description BLOOD RIGHT HAND  Final   Special Requests   Final    BOTTLES DRAWN AEROBIC ONLY Blood Culture adequate volume   Culture   Final    NO GROWTH < 24 HOURS Performed at [facility], [place] [address], [place], [place] [address]    Report Status PENDING  Incomplete     Scheduled Meds: . aspirin EC  81 mg Oral Daily  . atorvastatin  20 mg Oral q1800  . Chlorhexidine Gluconate Cloth  6 each Topical Daily  . cholecalciferol  2,000 Units Oral QPM  . hydrocortisone cream   Topical BID  . latanoprost  1 drop Both Eyes QHS  . metoprolol tartrate  12.5 mg Oral Daily  . pantoprazole  40 mg Oral Daily  . predniSONE  40 mg Oral Q breakfast  . sertraline  50 mg Oral QPM  . tamsulosin  0.8 mg Oral QPC supper  . timolol  1 drop Right Eye Daily   Continuous Infusions: . ceFEPime (MAXIPIME) IV 2 g (09/20/19 0132)     LOS: 4 days   Cherene Altes, MD [facility] Office  [phone] Pager - Text Page per Amion  If 7PM-7AM, please contact night-coverage per Amion 09/20/2019, 9:46 AM

## 2019-09-20 NOTE — Progress Notes (Signed)
Subjective: Slept a little better last night and enjoying breakfast this morning. No new complaints. Feeling well. Still having catheter in place.    Antibiotics:  Anti-infectives (From admission, onward)   Start     Dose/Rate Route Frequency Ordered Stop   09/18/19 1300  ceFEPIme (MAXIPIME) 2 g in sodium chloride 0.9 % 100 mL IVPB     Discontinue     2 g 200 mL/hr over 30 Minutes Intravenous Every 12 hours 09/18/19 1209     09/17/19 1400  cefTRIAXone (ROCEPHIN) 2 g in sodium chloride 0.9 % 100 mL IVPB  Status:  Discontinued        2 g 200 mL/hr over 30 Minutes Intravenous Every 24 hours 09/16/19 1424 09/17/19 1120   09/17/19 1200  ceFEPIme (MAXIPIME) 2 g in sodium chloride 0.9 % 100 mL IVPB  Status:  Discontinued        2 g 200 mL/hr over 30 Minutes Intravenous Every 24 hours 09/17/19 1120 09/18/19 1209   09/16/19 1430  cefTRIAXone (ROCEPHIN) 1 g in sodium chloride 0.9 % 100 mL IVPB       Note to Pharmacy: Make total of 2 gm for today to cover putative endocarditis   1 g 200 mL/hr over 30 Minutes Intravenous  Once 09/16/19 1424 09/16/19 1751   09/16/19 1345  cefTRIAXone (ROCEPHIN) 1 g in sodium chloride 0.9 % 100 mL IVPB        1 g 200 mL/hr over 30 Minutes Intravenous  Once 09/16/19 1343 09/16/19 1444      Medications: Scheduled Meds: . aspirin EC  81 mg Oral Daily  . atorvastatin  20 mg Oral q1800  . Chlorhexidine Gluconate Cloth  6 each Topical Daily  . cholecalciferol  2,000 Units Oral QPM  . furosemide  20 mg Oral Daily  . hydrocortisone cream   Topical BID  . latanoprost  1 drop Both Eyes QHS  . metoprolol tartrate  12.5 mg Oral Daily  . pantoprazole  40 mg Oral Daily  . [START ON 09/21/2019] potassium chloride  10 mEq Oral Daily  . predniSONE  40 mg Oral Q breakfast  . sertraline  50 mg Oral QPM  . tamsulosin  0.8 mg Oral QPC supper  . timolol  1 drop Right Eye Daily   Continuous Infusions: . ceFEPime (MAXIPIME) IV 2 g (09/20/19 0132)   PRN  Meds:.acetaminophen **OR** [DISCONTINUED] acetaminophen (TYLENOL) oral liquid 160 mg/5 mL **OR** [DISCONTINUED] acetaminophen, fluocinonide, senna-docusate, triamcinolone cream    Objective: Weight change:   Intake/Output Summary (Last 24 hours) at 09/20/2019 1214 Last data filed at 09/19/2019 1847 Gross per 24 hour  Intake 240 ml  Output 600 ml  Net -360 ml   Blood pressure 106/66, pulse 60, temperature 97.7 F (36.5 C), temperature source Oral, resp. rate 20, height 5\' 4"  (1.610 m), weight 79.5 kg, SpO2 97 %. Temp:  [97.7 F (36.5 C)-98.5 F (36.9 C)] 97.7 F (36.5 C) (07/20 1136) Pulse Rate:  [53-64] 60 (07/20 1136) Resp:  [16-20] 20 (07/20 1136) BP: (106-161)/(66-81) 106/66 (07/20 1136) SpO2:  [94 %-97 %] 97 % (07/20 1136)  Physical Exam: General: awake sitting comfortably in bed eating breakfast. Oriented HEENT: anicteric sclera, EOMI CVS regular rate, normal, no murmur appreciated  Chest: , no wheezing, no respiratory distress Abdomen: soft non-distended,  Neuro: nonfocal  CBC:    Component Value Date/Time   WBC 18.3 (H) 09/20/2019 0234   RBC 4.12 (L) 09/20/2019 0234  HGB 10.6 (L) 09/20/2019 0234   HGB 11.2 (L) 09/13/2019 1102   HGB 11.9 (L) 08/15/2019 0808   HCT 32.2 (L) 09/20/2019 0234   HCT 35.2 (L) 08/15/2019 0808   PLT 54 (L) 09/20/2019 0234   PLT 56 (L) 09/13/2019 1102   PLT 11 (LL) 08/15/2019 0808   MCV 78.2 (L) 09/20/2019 0234   MCV 79 08/15/2019 0808   MCH 25.7 (L) 09/20/2019 0234   MCHC 32.9 09/20/2019 0234   RDW 18.0 (H) 09/20/2019 0234   RDW 16.3 (H) 08/15/2019 0808   LYMPHSABS 0.6 (L) 09/17/2019 0752   LYMPHSABS 1.3 09/17/2016 1612   MONOABS 2.9 (H) 09/17/2019 0752   EOSABS 0.1 09/17/2019 0752   EOSABS 0.2 09/17/2016 1612   BASOSABS 0.1 09/17/2019 0752   BASOSABS 0.1 09/17/2016 1612      BMET Recent Labs    09/19/19 0838 09/20/19 0234  NA 133* 131*  K 3.4* 4.4  CL 102 100  CO2 23 23  GLUCOSE 169* 171*  BUN 19 23  CREATININE  1.08 1.02  CALCIUM 8.3* 8.3*     Liver Panel  Recent Labs    09/18/19 0511  PROT 4.6*  ALBUMIN 2.2*  AST 19  ALT 21  ALKPHOS 59  BILITOT 0.5       Sedimentation Rate No results for input(s): ESRSEDRATE in the last 72 hours. C-Reactive Protein No results for input(s): CRP in the last 72 hours.  Micro Results: Recent Results (from the past 720 hour(s))  SARS CORONAVIRUS 2 (TAT 6-24 HRS) Nasopharyngeal Nasopharyngeal Swab     Status: None   Collection Time: 09/03/19 11:18 AM   Specimen: Nasopharyngeal Swab  Result Value Ref Range Status   SARS Coronavirus 2 NEGATIVE NEGATIVE Final    Comment: (NOTE) SARS-CoV-2 target nucleic acids are NOT DETECTED.  The SARS-CoV-2 RNA is generally detectable in upper and lower respiratory specimens during the acute phase of infection. Negative results do not preclude SARS-CoV-2 infection, do not rule out co-infections with other pathogens, and should not be used as the sole basis for treatment or other patient management decisions. Negative results must be combined with clinical observations, patient history, and epidemiological information. The expected result is Negative.  Fact Sheet for Patients: SugarRoll.be  Fact Sheet for Healthcare Providers: https://www.woods-mathews.com/  This test is not yet approved or cleared by the [place] FDA and  has been authorized for detection and/or diagnosis of SARS-CoV-2 by FDA under an Emergency Use Authorization (EUA). This EUA will remain  in effect (meaning this test can be used) for the duration of the COVID-19 declaration under Se ction 564(b)(1) of the Act, 21 U.S.C. section 360bbb-3(b)(1), unless the authorization is terminated or revoked sooner.  Performed at [facility], [place] [address] [place], [place] [address]   Culture, Urine     Status: None   Collection Time: 09/07/19  6:05 AM   Specimen: Urine, Catheterized  Result  Value Ref Range Status   Specimen Description URINE, CATHETERIZED  Final   Special Requests NONE  Final   Culture   Final    NO GROWTH Performed at [facility], [address], [place], [place] [address]    Report Status 09/08/2019 FINAL  Final  Urine culture     Status: Abnormal   Collection Time: 09/16/19 12:30 PM   Specimen: In/Out Cath Urine  Result Value Ref Range Status   Specimen Description IN/OUT CATH URINE  Final   Special Requests  Final    NONE Performed at [facility], [place] [address] [place], [place] [address]    Culture >=100,000 COLONIES/mL PSEUDOMONAS AERUGINOSA (A)  Final   Report Status 09/18/2019 FINAL  Final   Organism ID, Bacteria PSEUDOMONAS AERUGINOSA (A)  Final      Susceptibility   Pseudomonas aeruginosa - MIC*    CEFTAZIDIME 2 SENSITIVE Sensitive     CIPROFLOXACIN 0.5 SENSITIVE Sensitive     GENTAMICIN <=1 SENSITIVE Sensitive     IMIPENEM 2 SENSITIVE Sensitive     PIP/TAZO <=4 SENSITIVE Sensitive     CEFEPIME 2 SENSITIVE Sensitive     * >=100,000 COLONIES/mL PSEUDOMONAS AERUGINOSA  Blood Culture (routine x 2)     Status: Abnormal   Collection Time: 09/16/19 12:58 PM   Specimen: BLOOD LEFT HAND  Result Value Ref Range Status   Specimen Description BLOOD LEFT HAND  Final   Special Requests   Final    BOTTLES DRAWN AEROBIC AND ANAEROBIC Blood Culture results may not be optimal due to an inadequate volume of blood received in culture bottles   Culture  Setup Time   Final    GRAM NEGATIVE RODS AEROBIC BOTTLE ONLY CRITICAL VALUE NOTED.  VALUE IS CONSISTENT WITH PREVIOUSLY REPORTED AND CALLED VALUE.    Culture (A)  Final    PSEUDOMONAS AERUGINOSA SUSCEPTIBILITIES PERFORMED ON PREVIOUS CULTURE WITHIN THE LAST 5 DAYS. Performed at [facility], [place] [address], [place], [place] [address]    Report Status 09/19/2019 FINAL  Final  Blood Culture (routine x 2)     Status: Abnormal   Collection Time: 09/16/19  1:00 PM   Specimen: BLOOD   Result Value Ref Range Status   Specimen Description BLOOD RIGHT ANTECUBITAL  Final   Special Requests   Final    BOTTLES DRAWN AEROBIC AND ANAEROBIC Blood Culture adequate volume   Culture  Setup Time   Final    GRAM NEGATIVE RODS IN BOTH AEROBIC AND ANAEROBIC BOTTLES CRITICAL RESULT CALLED TO, READ BACK BY AND VERIFIED WITH: Rubie Maid [id number] Performed at [facility], [place] [address] [place], [place] [address]    Culture PSEUDOMONAS AERUGINOSA (A)  Final   Report Status 09/19/2019 FINAL  Final   Organism ID, Bacteria PSEUDOMONAS AERUGINOSA  Final      Susceptibility   Pseudomonas aeruginosa - MIC*    CEFTAZIDIME 2 SENSITIVE Sensitive     CIPROFLOXACIN <=0.25 SENSITIVE Sensitive     GENTAMICIN <=1 SENSITIVE Sensitive     IMIPENEM <=0.25 SENSITIVE Sensitive     PIP/TAZO <=4 SENSITIVE Sensitive     CEFEPIME 2 SENSITIVE Sensitive     * PSEUDOMONAS AERUGINOSA  Blood Culture ID Panel (Reflexed)     Status: Abnormal   Collection Time: 09/16/19  1:00 PM  Result Value Ref Range Status   Enterococcus species NOT DETECTED NOT DETECTED Final   Listeria monocytogenes NOT DETECTED NOT DETECTED Final   Staphylococcus species NOT DETECTED NOT DETECTED Final   Staphylococcus aureus (BCID) NOT DETECTED NOT DETECTED Final   Streptococcus species NOT DETECTED NOT DETECTED Final   Streptococcus agalactiae NOT DETECTED NOT DETECTED Final   Streptococcus pneumoniae NOT DETECTED NOT DETECTED Final   Streptococcus pyogenes NOT DETECTED NOT DETECTED Final   Acinetobacter baumannii NOT DETECTED NOT DETECTED Final   Enterobacteriaceae species NOT DETECTED NOT DETECTED Final   Enterobacter cloacae complex NOT DETECTED NOT DETECTED Final   Escherichia coli NOT DETECTED NOT DETECTED Final  Klebsiella oxytoca NOT DETECTED NOT DETECTED Final   Klebsiella pneumoniae NOT DETECTED NOT DETECTED Final   Proteus species NOT DETECTED NOT DETECTED Final   Serratia marcescens NOT DETECTED NOT  DETECTED Final   Carbapenem resistance NOT DETECTED NOT DETECTED Final   Haemophilus influenzae NOT DETECTED NOT DETECTED Final   Neisseria meningitidis NOT DETECTED NOT DETECTED Final   Pseudomonas aeruginosa DETECTED (A) NOT DETECTED Final    Comment: CRITICAL RESULT CALLED TO, READ BACK BY AND VERIFIED WITH: PHARMD T DANG [id number] MLM    Candida albicans NOT DETECTED NOT DETECTED Final   Candida glabrata NOT DETECTED NOT DETECTED Final   Candida krusei NOT DETECTED NOT DETECTED Final   Candida parapsilosis NOT DETECTED NOT DETECTED Final   Candida tropicalis NOT DETECTED NOT DETECTED Final    Comment: Performed at [facility], [place] [address] [place], [place] [address]  SARS Coronavirus 2 by RT PCR (hospital order, performed in [facility] hospital lab) Nasopharyngeal Nasopharyngeal Swab     Status: None   Collection Time: 09/16/19  1:33 PM   Specimen: Nasopharyngeal Swab  Result Value Ref Range Status   SARS Coronavirus 2 NEGATIVE NEGATIVE Final    Comment: (NOTE) SARS-CoV-2 target nucleic acids are NOT DETECTED.  The SARS-CoV-2 RNA is generally detectable in upper and lower respiratory specimens during the acute phase of infection. The lowest concentration of SARS-CoV-2 viral copies this assay can detect is 250 copies / mL. A negative result does not preclude SARS-CoV-2 infection and should not be used as the sole basis for treatment or other patient management decisions.  A negative result may occur with improper specimen collection / handling, submission of specimen other than nasopharyngeal swab, presence of viral mutation(s) within the areas targeted by this assay, and inadequate number of viral copies (<250 copies / mL). A negative result must be combined with clinical observations, patient history, and epidemiological information.  Fact Sheet for Patients:   StrictlyIdeas.no  Fact Sheet for Healthcare  Providers: BankingDealers.co.za  This test is not yet approved or  cleared by the [place] FDA and has been authorized for detection and/or diagnosis of SARS-CoV-2 by FDA under an Emergency Use Authorization (EUA).  This EUA will remain in effect (meaning this test can be used) for the duration of the COVID-19 declaration under Section 564(b)(1) of the Act, 21 U.S.C. section 360bbb-3(b)(1), unless the authorization is terminated or revoked sooner.  Performed at [facility], [place] [address] [place], [place] [address]   Culture, blood (Routine X 2) w Reflex to ID Panel     Status: None (Preliminary result)   Collection Time: 09/19/19  3:09 PM   Specimen: BLOOD LEFT HAND  Result Value Ref Range Status   Specimen Description BLOOD LEFT HAND  Final   Special Requests   Final    BOTTLES DRAWN AEROBIC ONLY Blood Culture adequate volume   Culture   Final    NO GROWTH < 24 HOURS Performed at [facility], [place] [address] [place], [place] [address]    Report Status PENDING  Incomplete  Culture, blood (Routine X 2) w Reflex to ID Panel     Status: None (Preliminary result)   Collection Time: 09/19/19  3:09 PM   Specimen: BLOOD RIGHT HAND  Result Value Ref Range Status   Specimen Description BLOOD RIGHT HAND  Final   Special Requests   Final    BOTTLES DRAWN AEROBIC ONLY Blood Culture adequate volume  Culture   Final    NO GROWTH < 24 HOURS Performed at [facility], [place] [address], [place], [place] [address]    Report Status PENDING  Incomplete    Studies/Results: VAS [place] TRANSCRANIAL DOPPLER  Result Date: 09/20/2019  Transcranial Doppler Indications: Stroke. Performing Technologist: Maudry Mayhew MHA, RDMS, RVT, RDCS  Examination Guidelines: A complete evaluation includes B-mode imaging, spectral Doppler, color Doppler, and power Doppler as needed of all accessible portions of each vessel. Bilateral testing is considered an integral  part of a complete examination. Limited examinations for reoccurring indications may be performed as noted.  +----------+-------------+----------+-----------+-------+ RIGHT TCD Right VM (cm)Depth (cm)PulsatilityComment +----------+-------------+----------+-----------+-------+ MCA           16.00                 1.53            +----------+-------------+----------+-----------+-------+ ACA          -21.00                 1.03            +----------+-------------+----------+-----------+-------+ PCA           20.00                 1.15            +----------+-------------+----------+-----------+-------+ Opthalmic     15.00                 1.08            +----------+-------------+----------+-----------+-------+ ICA siphon    18.00                 1.68            +----------+-------------+----------+-----------+-------+ Vertebral    -14.00                 1.38            +----------+-------------+----------+-----------+-------+  +----------+------------+----------+-----------+-------+ LEFT TCD  Left VM (cm)Depth (cm)PulsatilityComment +----------+------------+----------+-----------+-------+ MCA          31.00                 1.40            +----------+------------+----------+-----------+-------+ ACA          -14.00                1.22            +----------+------------+----------+-----------+-------+ PCA          11.00                 0.95            +----------+------------+----------+-----------+-------+ Opthalmic    15.00                 0.83            +----------+------------+----------+-----------+-------+ ICA siphon   21.00                 1.39            +----------+------------+----------+-----------+-------+ Vertebral    -19.00                1.53            +----------+------------+----------+-----------+-------+  Summary:  Suboptimal waveforms throughout due to poor bony windows throughout limit study. antegrade flow noted  in all vessels identified. Globally elevated pulsatility indices along with low mean  flow velocities throughout suggest diffus eintracranial atherosclerosis. *See table(s) above for TCD measurements and observations.  Diagnosing physician: Antony Contras MD Electronically signed by Antony Contras MD on 09/20/2019 at 11:48:00 AM.    Final    [place] EKG SITE RITE  Result Date: 09/20/2019 If Site Rite image not attached, placement could not be confirmed due to current cardiac rhythm.     Assessment/Plan:  INTERVAL HISTORY: no changes   Active Problems:   S/P TAVR (transcatheter aortic valve replacement)   Stroke (cerebrum) (HCC)   Sepsis (HCC)   Urinary tract infection without hematuria   Pseudomonas aeruginosa infection   Paroxysmal atrial fibrillation (HCC)    [name] is a 86 y.o. male with history of hairy cell leukemia, thrombocytopenia critical aortic stenosis status post TAVR, with recent problems with urinary retention admitted with fevers confusion.  He was found on CT scan to have multiple areas of what appear to be ischemic strokes.  He was also in atrial fibrillation again.  His urine and blood cultures have grown Pseudomonas aeruginosa.  2D echocardiogram is unrevealing for endocarditis.  D/W Dr. Megan Salon and Dr. Tommy Medal and will go forward with 4 weeks of IV antibiotics and close follow up for more cautious approach given consideration for TAVR.   He already has follow-up hospital follow-up with Dr. Megan Salon and OPAT orders from yesterday's note still applicable. D/W Dr. Thereasa Solo and will place order for PICC. Notified home infusion team.    Janene Madeira, MSN, NP-C [facility] [email or web address] Pager: [phone] Office: [phone] [place]: [phone]

## 2019-09-20 NOTE — Progress Notes (Signed)
Progress Note  Patient Name: [name] Date of Encounter: 09/20/2019  [facility] Cardiologist: Sinclair Grooms, MD   Subjective   Doing well today.  No chest pain or pressure.  No real shortness of breath. Had PICC line placed.  Inpatient Medications    Scheduled Meds: . aspirin EC  81 mg Oral Daily  . atorvastatin  20 mg Oral q1800  . Chlorhexidine Gluconate Cloth  6 each Topical Daily  . cholecalciferol  2,000 Units Oral QPM  . furosemide  20 mg Oral Daily  . hydrocortisone cream   Topical BID  . latanoprost  1 drop Both Eyes QHS  . metoprolol tartrate  12.5 mg Oral Daily  . pantoprazole  40 mg Oral Daily  . [START ON 09/21/2019] potassium chloride  10 mEq Oral Daily  . predniSONE  40 mg Oral Q breakfast  . sertraline  50 mg Oral QPM  . sodium chloride flush  10-40 mL Intracatheter Q12H  . tamsulosin  0.8 mg Oral QPC supper  . timolol  1 drop Right Eye Daily   Continuous Infusions: . ceFEPime (MAXIPIME) IV 2 g (09/20/19 1258)   PRN Meds: acetaminophen **OR** [DISCONTINUED] acetaminophen (TYLENOL) oral liquid 160 mg/5 mL **OR** [DISCONTINUED] acetaminophen, fluocinonide, senna-docusate, sodium chloride flush, triamcinolone cream   Vital Signs    Vitals:   09/19/19 2333 09/20/19 0351 09/20/19 0748 09/20/19 1136  BP: (!) 152/77 (!) 161/80 (!) 157/77 106/66  Pulse: (!) 57 (!) 57 (!) 53 60  Resp: 17 17 20 20   Temp: 97.8 F (36.6 C) 98.5 F (36.9 C) 97.8 F (36.6 C) 97.7 F (36.5 C)  TempSrc:  Oral Oral Oral  SpO2: 95% 94% 95% 97%  Weight:      Height:        Intake/Output Summary (Last 24 hours) at 09/20/2019 1617 Last data filed at 09/19/2019 1847 Gross per 24 hour  Intake 240 ml  Output --  Net 240 ml   Last 3 Weights 09/16/2019 09/14/2019 09/13/2019  Weight (lbs) 175 lb 4.3 oz 173 lb 12.8 oz 173 lb 8 oz  Weight (kg) 79.5 kg 78.835 kg 78.699 kg      Telemetry    Mostly sinus rhythm- Personally Reviewed  ECG    No new study- Personally  Reviewed  Physical Exam   GEN:  Pleasant elderly gentleman.  Sitting in chair, no acute distress.   Neck: No JVD Cardiac: RRR, 3/6 SEM at RUSB--carotid.  No rubs, or gallops.  Respiratory: Clear to auscultation bilaterally.  Nonlabored, good air movement.   MS: No edema; No deformity.  Diffuse ecchymosis on both forearms Neuro:  Nonfocal  Psych: Normal affect   Labs    High Sensitivity Troponin:  No results for input(s): TROPONINIHS in the last 720 hours.    Chemistry Recent Labs  Lab 09/16/19 1146 09/16/19 1151 09/18/19 0511 09/19/19 0838 09/20/19 0234  NA 133*   < > 131* 133* 131*  K 4.2   < > 3.5 3.4* 4.4  CL 97*   < > 99 102 100  CO2 25   < > 23 23 23   GLUCOSE 97   < > 154* 169* 171*  BUN 23   < > 22 19 23   CREATININE 1.72*   < > 1.23 1.08 1.02  CALCIUM 8.4*   < > 7.7* 8.3* 8.3*  PROT 4.9*  --  4.6*  --   --   ALBUMIN 2.8*  --  2.2*  --   --  AST 24  --  19  --   --   ALT 27  --  21  --   --   ALKPHOS 51  --  59  --   --   BILITOT 1.3*  --  0.5  --   --   GFRNONAA 34*   < > 50* 59* >60  GFRAA 39*   < > 58* >60 >60  ANIONGAP 11   < > 9 8 8    < > = values in this interval not displayed.     Hematology Recent Labs  Lab 09/18/19 0511 09/19/19 0838 09/20/19 0234  WBC 25.3* 19.9* 18.3*  RBC 3.97* 4.23 4.12*  HGB 10.1* 10.5* 10.6*  HCT 31.0* 33.1* 32.2*  MCV 78.1* 78.3* 78.2*  MCH 25.4* 24.8* 25.7*  MCHC 32.6 31.7 32.9  RDW 18.2* 18.2* 18.0*  PLT 57* 60* 54*    BNPNo results for input(s): BNP, PROBNP in the last 168 hours.   DDimer No results for input(s): DDIMER in the last 168 hours.   Radiology    VAS [place] TRANSCRANIAL DOPPLER  Result Date: 09/20/2019  Transcranial Doppler Indications: Stroke. Performing Technologist: Maudry Mayhew MHA, RDMS, RVT, RDCS  Examination Guidelines: A complete evaluation includes B-mode imaging, spectral Doppler, color Doppler, and power Doppler as needed of all accessible portions of each vessel. Bilateral testing  is considered an integral part of a complete examination. Limited examinations for reoccurring indications may be performed as noted.  +----------+-------------+----------+-----------+-------+ RIGHT TCD Right VM (cm)Depth (cm)PulsatilityComment +----------+-------------+----------+-----------+-------+ MCA           16.00                 1.53            +----------+-------------+----------+-----------+-------+ ACA          -21.00                 1.03            +----------+-------------+----------+-----------+-------+ PCA           20.00                 1.15            +----------+-------------+----------+-----------+-------+ Opthalmic     15.00                 1.08            +----------+-------------+----------+-----------+-------+ ICA siphon    18.00                 1.68            +----------+-------------+----------+-----------+-------+ Vertebral    -14.00                 1.38            +----------+-------------+----------+-----------+-------+  +----------+------------+----------+-----------+-------+ LEFT TCD  Left VM (cm)Depth (cm)PulsatilityComment +----------+------------+----------+-----------+-------+ MCA          31.00                 1.40            +----------+------------+----------+-----------+-------+ ACA          -14.00                1.22            +----------+------------+----------+-----------+-------+ PCA          11.00  0.95            +----------+------------+----------+-----------+-------+ Opthalmic    15.00                 0.83            +----------+------------+----------+-----------+-------+ ICA siphon   21.00                 1.39            +----------+------------+----------+-----------+-------+ Vertebral    -19.00                1.53            +----------+------------+----------+-----------+-------+  Summary:  Suboptimal waveforms throughout due to poor bony windows throughout limit  study. antegrade flow noted in all vessels identified. Globally elevated pulsatility indices along with low mean flow velocities throughout suggest diffus eintracranial atherosclerosis. *See table(s) above for TCD measurements and observations.  Diagnosing physician: Antony Contras MD Electronically signed by Antony Contras MD on 09/20/2019 at 11:48:00 AM.    Final    [place] EKG SITE RITE  Result Date: 09/20/2019 If Site Rite image not attached, placement could not be confirmed due to current cardiac rhythm.   Cardiac Studies   Echocardiogram 09/17/2019: LVEF 60 of disease to 5%.  No R WMA.  GR 1 DD with elevated LAP.  RV function normal.  Edwards Sapien 3 THV size 26 mm, model #[id number] is in the AV  position. Mildly elevated mean gradient of 25 mmHg based on published  normals, has signifcantly increased from prior study 09/07/19. The LVOT/ AV  VTI ratio has also decreased. Limited morphological visualization of prosthetic valve in general with TTE, no gross visualized thrombus or vegetation.   Given presentation with bacteremia, CVA, and increased gradient across prosthetic valve would  recommend TEE for better visualization. . The  aortic valve has been repaired/replaced. Aortic valve regurgitation is not  visualized. There is a 26 mm Edwards Sapien prosthetic (TAVR) valve  present in the aortic position. Procedure Date: 09/06/2019.  5. The inferior vena cava is normal in size with greater than 50%  respiratory variability, suggesting right atrial pressure of 3 mmHg.      Patient Profile     86 y.o. male with a history of CAD, severe aortic s/p recent TAVR on 09/06/19, RBBB, chronic thrombocytopenia and urinary retention who was discharged home last week after TAVR with a foley in place for urinary retention. He had the foley catheter removed two days ago in the Urology office and has not voided well since then. His wife notes increasing lower extremity edema over the past few days. He went into  the Urology Office today for follow-up after his wife reported fevers at home. While in the urology office, he developed facial droop and aphasia and was brought to [facility] via EMS. He was seen by Neurology and not felt to a candidate for tPA. Stroke like symptoms resolved. Head CT with evidence of acute/subacute infarct in the left frontal lobe. Of note, he was discharged on Aspirin alone following his TAVR given history of profound thrombocytopenia which is followed closely in hematology by Dr. Benay Spice. He was wearing a cardiac monitor post TAVR and had atrial fibrillation noted. The valve team discussed this and elected to wait until we saw his total atrial fibrillation burden to consider whether or not anti-coagulation should be discussed with Hematology given his chronic issues.   Assessment & Plan    Urosepsis with Bacteremia due  to Pseudomonas Infection  On antibiotics, PICC line placed to complete IV antibiotics in the outpatient setting.    TTE did not show any evidence of gross thrombus around TAVR valve.    Recommend against TEE (low risk for endocarditis with Pseudomonas).   Recent Left Frontal CVA -> unclear if this is a complication from TAVR versus A. fib.  Has not been felt to be a good [place] candidate secondary to thrombocytopenia  Appreciate input by Dr. Benay Spice --> okay to place on aspirin 81 mg (concerned that he may have "failed "aspirin. -->  Suggested that he could receive DOAC with platelet count at this range, but defers decision-making to neurology.  Severe Aortic Stenosis s/p Recent TAVR Echo this admission showed Mildly elevated mean gradient of 25 mmHg based on published  normals, has signifcantly increased from prior study 09/07/19. The LVOT/ AV  VTI ratio has also decreased.   See yesterday's note--started on aspirin 81 mg daily.  Paroxysmal Atrial Fibrillation / SVT Telemetry shows maintaining sinus rhythm. On home Lopressor 12.5mg  once daily (outpatient dose).    =-> He is currently wearing Zio patch monitor.  Due to be turned in tomorrow.  We will look at the results in order to determine A. fib burden.  .  Most recent discussion had been to consider aspirin and not DOAC, however based on Dr.  Gearldine Shown input, perhaps DOAC such as Eliquis would not be unreasonable.   Chronic Diastolic CHF ->LVEF of 40-81% this admission.  seems euvolemic.  PO Lasix restarted yesterday at 20mg  daily.  ->  Brisk urine output noted overnight the day before, slightly less overnight last night.;  Continue home dose  CAD : Stable CAD by cath in May 2021 pre-TAVR  Continue ASA (not if DOAC added), low-dose beta-blocker and statin  Hyperlipidemia -on statin Continue Lipitor 20mg  daily.  AKI - Resolved Creatinine peaked at 1.72 but now back to 1.02. Felt to be secondary to sepsis. Continue to monitor.  Thrombocytopenia Platelets 54 today. Aspirin 81mg  daily OK per Dr. Benay Spice. (Hematology).  Hypomagnesemia Magnesium 1.6 today. Repleted. Recheck tomorrow.   For questions or updates, please contact [place] Please consult [email or web address] for contact info under        Signed, Darreld Mclean, PA-C  09/20/2019, 4:17 PM    ATTENDING ATTESTATION  I have seen, examined and evaluated the patient this evening along with Sande Rives, PA.  After reviewing all the available data and chart, we discussed the patients laboratory, study & physical findings as well as symptoms in detail. I agree with her findings, examination as well as impression recommendations as per our discussion.    Attending adjustments noted in italics.   Overall, Jacier is doing very well.  No active cardiac issues.  [facility] will sign off.   Medication Recommendations: Continue current medications, defer decision on aspirin versus DOAC to neurology based on stroke. Other recommendations (labs, testing, etc): Patient's wife will be turning in Zio patch monitor tomorrow.  We will  await results. Follow up as an outpatient: Has TAVR follow-up appointment scheduled with Nell Range, PA -> will discuss with TAVR team and primary cardiologist to determine if earlier follow-up is required. ->  If so, will be arranged by cardiology staff.   Glenetta Hew, M.D., M.S. Interventional Cardiologist   Pager # [phone] Phone # [phone] [address] [place], [place] [address]

## 2019-09-21 DIAGNOSIS — I63019 Cerebral infarction due to thrombosis of unspecified vertebral artery: Secondary | ICD-10-CM

## 2019-09-21 DIAGNOSIS — G6281 Critical illness polyneuropathy: Secondary | ICD-10-CM

## 2019-09-21 LAB — CBC
HCT: 33.7 % — ABNORMAL LOW (ref 39.0–52.0)
Hemoglobin: 10.7 g/dL — ABNORMAL LOW (ref 13.0–17.0)
MCH: 25.2 pg — ABNORMAL LOW (ref 26.0–34.0)
MCHC: 31.8 g/dL (ref 30.0–36.0)
MCV: 79.5 fL — ABNORMAL LOW (ref 80.0–100.0)
Platelets: 61 10*3/uL — ABNORMAL LOW (ref 150–400)
RBC: 4.24 MIL/uL (ref 4.22–5.81)
RDW: 18.2 % — ABNORMAL HIGH (ref 11.5–15.5)
WBC: 22.2 10*3/uL — ABNORMAL HIGH (ref 4.0–10.5)
nRBC: 0 % (ref 0.0–0.2)

## 2019-09-21 LAB — BASIC METABOLIC PANEL
Anion gap: 9 (ref 5–15)
BUN: 29 mg/dL — ABNORMAL HIGH (ref 8–23)
CO2: 24 mmol/L (ref 22–32)
Calcium: 8.3 mg/dL — ABNORMAL LOW (ref 8.9–10.3)
Chloride: 99 mmol/L (ref 98–111)
Creatinine, Ser: 1.19 mg/dL (ref 0.61–1.24)
GFR calc Af Amer: >60 mL/min (ref >60)
GFR calc non Af Amer: 52 mL/min — ABNORMAL LOW (ref >60)
Glucose, Bld: 125 mg/dL — ABNORMAL HIGH (ref 70–99)
Potassium: 3.8 mmol/L (ref 3.5–5.1)
Sodium: 132 mmol/L — ABNORMAL LOW (ref 135–145)

## 2019-09-21 LAB — MAGNESIUM: Magnesium: 1.9 mg/dL (ref 1.7–2.4)

## 2019-09-21 MED ORDER — HEPARIN SOD (PORK) LOCK FLUSH 100 UNIT/ML IV SOLN
250.0000 [IU] | INTRAVENOUS | Status: AC | PRN
  Administered 2019-09-21: 250 [IU]
  Filled 2019-09-21: qty 2.5

## 2019-09-21 MED ORDER — CEFEPIME IV (FOR PTA / DISCHARGE USE ONLY): 2.0000 g | Freq: Two times a day (BID) | INTRAVENOUS | 0 refills | Status: DC

## 2019-09-21 MED ORDER — FUROSEMIDE 40 MG PO TABS: 20.0000 mg | ORAL_TABLET | Freq: Every day | ORAL | Status: DC

## 2019-09-21 MED ORDER — ASPIRIN 81 MG PO TBEC: 81.0000 mg | DELAYED_RELEASE_TABLET | Freq: Every day | ORAL | 11 refills | Status: DC

## 2019-09-21 NOTE — Progress Notes (Signed)
Discharged to home after extensive education on stroke and foley cath care by this nurse and IV therapy education by infusion nurse Pam.  Had many questions and scenarios given.  Wife had multiple questions and was provided all information.

## 2019-09-21 NOTE — Progress Notes (Signed)
Pharmacy Antibiotic Note  [name] is a 86 y.o. male admitted on 09/16/2019 with AMS, found to have Pseudomonas bacteremia and UTI.  Pharmacy has been consulted to switch from Rocephin to cefepime empirically. Urine culture sensitivities show pan sensitive Pseudomonas  Cr stable, OPAT done.  Plan: Continue cefepime 2g IV q12h Monitor renal fxn  Height: 5\' 4"  (162.6 cm) Weight: 79.5 kg (175 lb 4.3 oz) IBW/kg (Calculated) : 59.2  Temp (24hrs), Avg:98.1 F (36.7 C), Min:97.7 F (36.5 C), Max:98.6 F (37 C)  Recent Labs  Lab 09/16/19 1146 09/16/19 1300 09/16/19 1632 09/17/19 0752 09/18/19 0511 09/19/19 0838 09/20/19 0234 09/21/19 0553  WBC   < >  --   --  26.2* 25.3* 19.9* 18.3* 22.2*  CREATININE   < >  --   --  1.51* 1.23 1.08 1.02 1.19  LATICACIDVEN  --  1.7 1.4  --   --   --   --   --    < > = values in this interval not displayed.    Estimated Creatinine Clearance: 36.9 mL/min (by C-G formula based on SCr of 1.19 mg/dL).    Allergies  Allergen Reactions  . Antazoline Anaphylaxis  . Antihistamines, Chlorpheniramine-Type     Nervous, jittery  . Sulfa Antibiotics Rash   CTX x1 7/16 Cefepime 7/17 >>  7/16 UCx - Pseudomonas (pan sensitive) 7/16 BCx - Pseudomonas    Arrie Senate, PharmD, BCPS Clinical Pharmacist [phone] Please check AMION for all [facility] numbers 09/21/2019

## 2019-09-21 NOTE — TOC Transition Note (Signed)
Transition of Care [facility]) - CM/SW Discharge Note   Patient Details  Name: [name] MRN: [id number] Date of Birth: Jul 13, 1923  [facility] CM/SW Contact:  Geralynn Ochs, LCSW Phone Number: 09/21/2019, 11:24 AM   Clinical Narrative:   CSW notified [employer] and [facility] about discharge home today. Pam with [employer] to provide education and training with patient and wife today, patient can discharge home afterwards. No other needs identified at this time.     Final next level of care: [place] Barriers to Discharge: Barriers Resolved   Patient Goals and CMS Choice Patient states their goals for this hospitalization and ongoing recovery are:: to get home CMS Medicare.gov Compare Post Acute Care list provided to:: Patient Choice offered to / list presented to : Patient, Spouse  Discharge Placement                Patient to be transferred to facility by: Family Name of family member notified: Self, wife Patient and family notified of of transfer: 09/21/19  Discharge Plan and Services     Post Acute Care Choice: NA                    HH Arranged: PT, RN, OT, IV Antibiotics HH Agency: Tour manager, [place] ([place]) Date [place]: 09/21/19   Representative spoke with at [place]: Daine Gravel  Social Determinants of Health ([place]) Interventions     Readmission Risk Interventions Readmission Risk Prevention Plan 09/09/2019  Post Dischage Appt Complete  Medication Screening Complete  Transportation Screening Complete  Some recent data might be hidden

## 2019-09-21 NOTE — Progress Notes (Signed)
Physical Therapy Treatment Patient Details Name: [name] MRN: [id number] DOB: [birth year] Today's Date: 09/21/2019    History of Present Illness Newton Frutiger is a 86 yo male presenting with AMS. Upon work-up, imaging revealed large, left-sided MCA acute/early subacute infarct and UA indicates UTI. PMH includes: AS s/p TAVR on 09/06/19, CAD, HTN, chronic thrombocytopenia, hairy cell lymphoma s/p splenectomy, and chronic leukocytosis.    PT Comments    Pt progressing towards physical therapy goals. Was able to perform transfers and ambulation with gross min guard assist to supervision for safety without the RW. Noted pt fatigued quicker and with some dyspnea towards end of gait training. Recommend continued use of RW and progressing towards no AD with physical therapy (home health). Pt anticipates d/c home today. Will continue to follow and progress as able per POC.     Follow Up Recommendations  Home health PT;Supervision for mobility/OOB     Equipment Recommendations  None recommended by PT    Recommendations for Other Services       Precautions / Restrictions Precautions Precautions: Fall Precaution Comments: is s/p TAVR 7/6 with cardiac monitor still in place Restrictions Weight Bearing Restrictions: No    Mobility  Bed Mobility Overal bed mobility: Needs Assistance Bed Mobility: Supine to Sit     Supine to sit: Supervision     General bed mobility comments: Increased time required. No use of rails. Pt was able to transition to EOB without assistance.   Transfers Overall transfer level: Needs assistance Equipment used: None Transfers: Sit to/from Stand Sit to Stand: Supervision         General transfer comment: Pt demonstrated proper hand placement on seated surface for safety. No assist required and no overt LOB noted.   Ambulation/Gait Ambulation/Gait assistance: Min guard;Supervision Gait Distance (Feet): 450 Feet Assistive device: None Gait  Pattern/deviations: Decreased stride length;Trunk flexed;Step-through pattern Gait velocity: decreased Gait velocity interpretation: <1.8 ft/sec, indicate of risk for recurrent falls General Gait Details: Pt fatiguing quicker without the RW. 2 standing rest breaks required and noted pt sounded dyspneic towards end of gait training. At that time HR was 89 bpm. Negotiated around tight spaces in hallway well, as there was lots of equipment present in our path.    Stairs             Wheelchair Mobility    Modified Rankin (Stroke Patients Only)       Balance Overall balance assessment: Needs assistance Sitting-balance support: No upper extremity supported;Feet supported Sitting balance-Leahy Scale: Good     Standing balance support: Bilateral upper extremity supported;During functional activity;No upper extremity supported Standing balance-Leahy Scale: Fair                              Cognition Arousal/Alertness: Awake/alert Behavior During Therapy: WFL for tasks assessed/performed Overall Cognitive Status: Within Functional Limits for tasks assessed                                        Exercises      General Comments        Pertinent Vitals/Pain Pain Assessment: No/denies pain    Home Living                      Prior Function  PT Goals (current goals can now be found in the care plan section) Acute Rehab PT Goals Patient Stated Goal: return home today PT Goal Formulation: With patient Time For Goal Achievement: 10/01/19 Potential to Achieve Goals: Good Progress towards PT goals: Progressing toward goals    Frequency    Min 3X/week      PT Plan Current plan remains appropriate    Co-evaluation              AM-PAC PT "6 Clicks" Mobility   Outcome Measure  Help needed turning from your back to your side while in a flat bed without using bedrails?: None Help needed moving from lying on your  back to sitting on the side of a flat bed without using bedrails?: None Help needed moving to and from a bed to a chair (including a wheelchair)?: None Help needed standing up from a chair using your arms (e.g., wheelchair or bedside chair)?: None Help needed to walk in hospital room?: None Help needed climbing 3-5 steps with a railing? : None 6 Click Score: 24    End of Session Equipment Utilized During Treatment: Gait belt Activity Tolerance: Patient tolerated treatment well Patient left: in chair;with call bell/phone within reach Nurse Communication: Mobility status PT Visit Diagnosis: Difficulty in walking, not elsewhere classified (R26.2);Muscle weakness (generalized) (M62.81)     Time: 1761-6073 PT Time Calculation (min) (ACUTE ONLY): 39 min  Charges:  $Gait Training: 23-37 mins $Therapeutic Activity: 8-22 mins                     Rolinda Roan, PT, DPT Acute Rehabilitation Services Pager: [phone] Office: [phone]    Thelma Comp 09/21/2019, 12:21 PM

## 2019-09-21 NOTE — Progress Notes (Signed)
Occupational Therapy Treatment Patient Details Name: [name] MRN: [id number] DOB: 1923/06/29 Today's Date: 09/21/2019    History of present illness [name] is a 86 yo male presenting with AMS. Upon work-up, imaging revealed large, left-sided MCA acute/early subacute infarct and UA indicates UTI. PMH includes: AS s/p TAVR on 09/06/19, CAD, HTN, chronic thrombocytopenia, hairy cell lymphoma s/p splenectomy, and chronic leukocytosis.   OT comments  Pt making good progress towards OT goals this session. Session focus on standing standing ADL tasks, toileting and community distance functional mobility. Pt completed all functional mobility with no AD and min guard for safety. Pt likely to DC home with [facility] and assist from wife. Will follow acutely per POC.     Follow Up Recommendations  Home health OT;Supervision/Assistance - 24 hour    Equipment Recommendations  3 in 1 bedside commode    Recommendations for Other Services      Precautions / Restrictions Precautions Precautions: Fall Precaution Comments: is s/p TAVR 7/6 with cardiac monitor still in place Restrictions Weight Bearing Restrictions: No       Mobility Bed Mobility Overal bed mobility: Needs Assistance Bed Mobility: Sit to Supine     Supine to sit: Supervision Sit to supine: Min guard   General bed mobility comments: min guard for line mgmt  Transfers Overall transfer level: Needs assistance Equipment used: None Transfers: Sit to/from Stand Sit to Stand: Supervision         General transfer comment: close supervision, pt demos good hand placement and overall technique    Balance Overall balance assessment: Needs assistance Sitting-balance support: No upper extremity supported;Feet supported Sitting balance-Leahy Scale: Good     Standing balance support: No upper extremity supported;During functional activity Standing balance-Leahy Scale: Fair Standing balance comment: able to complete dynamic ADL  tasks with no UE support and no LOB                           ADL either performed or assessed with clinical judgement   ADL Overall ADL's : Needs assistance/impaired     Grooming: Oral care;Standing;Supervision/safety Grooming Details (indicate cue type and reason): gross supervision to stand at sink                 Toilet Transfer: Min guard;Ambulation;Grab Information systems manager Details (indicate cue type and reason): min guard for safety with pt able to ambulate to BR with min guard for safety, use fo grab bars noted. good safety awareness       Tub/Shower Transfer Details (indicate cue type and reason): pt reports walkin shower and seat Functional mobility during ADLs: Min guard General ADL Comments: pt with improved activity tolerance     Vision Baseline Vision/History: Wears glasses Wears Glasses: At all times     Perception     Praxis      Cognition Arousal/Alertness: Awake/alert Behavior During Therapy: WFL for tasks assessed/performed Overall Cognitive Status: Within Functional Limits for tasks assessed                                          Exercises     Shoulder Instructions       General Comments pts wife present during session, VSS HR max 85 bpm. education provided on increasing water consumption with use of 64 oz water bottle labeled with times to assist  with keeping pt accountable to drink water    Pertinent Vitals/ Pain       Pain Assessment: No/denies pain  Home Living                                          Prior Functioning/Environment              Frequency           Progress Toward Goals  OT Goals(current goals can now be found in the care plan section)  Progress towards OT goals: Progressing toward goals  Acute Rehab OT Goals Patient Stated Goal: return home today OT Goal Formulation: With patient Time For Goal Achievement: 10/01/19 Potential to Achieve  Goals: Good  Plan Discharge plan remains appropriate;Frequency remains appropriate    Co-evaluation                 AM-PAC OT "6 Clicks" Daily Activity     Outcome Measure   Help from another person eating meals?: None Help from another person taking care of personal grooming?: None Help from another person toileting, which includes using toliet, bedpan, or urinal?: A Little Help from another person bathing (including washing, rinsing, drying)?: A Little Help from another person to put on and taking off regular upper body clothing?: None Help from another person to put on and taking off regular lower body clothing?: A Little 6 Click Score: 21    End of Session Equipment Utilized During Treatment: Gait belt  OT Visit Diagnosis: Unsteadiness on feet (R26.81);Muscle weakness (generalized) (M62.81)   Activity Tolerance Patient tolerated treatment well   Patient Left Other (comment) (in bathroom; RN aware)   Nurse Communication Mobility status;Other (comment) (pt on toilet trying to have a BM)        Time: 1583-0940 OT Time Calculation (min): 42 min  Charges: OT General Charges $OT Visit: 1 Visit OT Treatments $Self Care/Home Management : 38-52 mins Lanier Clam., COTA/L Acute Rehabilitation Services [phone]   Ihor Gully 09/21/2019, 1:48 PM

## 2019-09-21 NOTE — Discharge Summary (Signed)
Physician Discharge Summary  [name] BHA:[id number] DOB: May 15, 1923 DOA: 09/16/2019  PCP: [name], MD  Admit date: 09/16/2019 Discharge date: 09/21/2019  Time spent: 45 minutes  Recommendations for Outpatient Follow-up:  Patient will be discharged to home with home health services.  Patient will need to follow up with primary care provider within one week of discharge, repeat CBC and BMP. Follow up with cardiology and urology.  Patient should continue medications as prescribed.  Patient should follow a heart healthy/carb modified diet.   Discharge Diagnoses:  Severe sepsis [place] to Pseudomonas urinary tract infection and bacteremia Acute left frontal lobe stroke-left MCA territory Acute kidney injury Aortic stenosis  Paroxysmal newly diagnosed atrial fibrillation Recurrent urinary retention Chronic thrombocytopenia Chronic leukocytosis CAD Chronic diastolic CHF Hyponatremia Hypomagnesemia Hypokalemia Microcytic anemia Hyperlipidemia Essential hypertension Prediabetes/elevated hemoglobin A1c  Discharge Condition: Stable  Diet recommendation: Heart healthy/carb modified  Filed Weights   09/16/19 1221  Weight: 79.5 kg    History of present illness:  On 09/16/2019 by Dr. Wynetta Fines [name] is a 86 y.o. male with medical history significant of AS s/p TAVR on 09/06/2019, chronic thrombocytopenia on steroid and Hx of hairy cell lymphoma s/p splenectomy, chronic leukocytosis, BPH, presented with altered mental status.  Patient was confused, most of the history provided by his wife at bedside.  Wife reported that patient was doing well after the TAVR procedure. Patient was on aspirin on discharge, however later discontinued because patient developed large bruises and hematoma on the right groin and his platelet count dropped.  Also on last admission, patient developed urinary retention during hospital stay last time and was discharged on Foley.  2 days ago Foley  was discontinued by urologist in the office, on same day, patient went to her cardiologist for follow-up who started him on amoxicillin for SBE prophylaxis.  Patient complained about feeling tired whole yesterday, and started to complain of feeling cold last night and trouble urinating.  Wife took his temperature in the middle night which read 101.  Wife contacted cardiology this morning and was directed to urologist office and Foley was placed in and urine looks cloudy.  While in urologist office patient was found to have a right facial droop and acute aphasia and sent to the EMS.  Hospital Course:  Severe sepsis [place] to Pseudomonas urinary tract infection and bacteremia -present on admission as patient had encephalopathy, acute kidney injury, leukocytosis -Sepsis physiology has resolved -Infectious disease consulted and appreciated -Urine and blood culture showing Pseudomonas -Patient will need 4 weeks of IV antibiotics, cefepime -Follow-up with the ID clinic, Dr. Megan Salon.  TEE not felt to be necessary or appropriate at this time  Acute left frontal lobe stroke-left MCA territory -MRI/MRA not able to be accomplished due to [facility] patch that patient was wearing -LDL 43, hemoglobin A1c 6.6 -PT and OT recommending home health therapy -Echocardiogram shows an EF of 60 to 97%, grade 1 diastolic dysfunction, no gross evidence of embolic source -Neurology consulted and appreciated -Aspirin treatment had to be held as of 09/13/2019 due to bleeding/bruising of groin and low platelet count -Stroke team did not feel patient was a candidate for DAPT or anticoagulation, suggesting use of aspirin 81 mg daily if approved by hematologist-which appears to be okay per Dr. Gearldine Shown note on 09/20/2019  Acute kidney injury -Creatinine was 1.72 on admission -Suspect prerenal -Creatinine currently 1.19 -Repeat BMP in 1 week  Aortic stenosis  -status post TAVR on 09/16/2019 -Patient had been discharged to  home on aspirin alone given chronic thrombocytopenia however this was stopped and follow-up with hematology due to thrombocytopenia and bleeding into groin related to recent TAVR. -Valve appears safe and normally functioning per cardiology team  Paroxysmal newly diagnosed atrial fibrillation -Patient was sent home post TAVR with cardiac monitor patch which did confirm intermittent episodes of atrial fibrillation -Cardiology consulted and appreciated, will determine arrhythmia burden to make decision regarding anticoagulation in the setting of chronic thrombocytopenia -Patient will need to follow-up with cardiology  Recurrent urinary retention -Discharged home with Foley catheter, which is subsequently removed on 7/14 however patient had recurrent urinary retention necessitating replacement of catheter on 09/16/2019 the urology office -Foley catheter to remain in place, Dr. Junious Silk will plan for timing of cystoscopy and removal Foley catheter, voiding trial.  Reached out to the office, they will contact the patient for an appointment  Chronic thrombocytopenia -Patient follows up with Dr. Benay Spice, oncology -On weekly Nplate treatment (last treatment 09/16/2019) and on chronic prednisone since Jul 25, 2019. -Patient did receive Nplate on 04/21/7586 during hospitalization -Platelets appear to be stable  Chronic leukocytosis -WBC count currently 22 -Question if this is due to chronic prednisone therapy  CAD -Cardiac catheterization on 07/26/2019 noted chronically occluded mid LAD, moderate disease in the diagonals and mid RCA -Continue medical management -Patient to follow-up with cardiology  Chronic diastolic CHF -Patient appears to be euvolemic and stable at this time  Hyponatremia -Currently sodium 132, repeat BMP in 1 week  Hypomagnesemia -Stable, magnesium currently 1.9 -Repeat in 1 week  Hypokalemia -Stable, repeat BMP in one week  Microcytic anemia -Hemoglobin  stable  Hyperlipidemia -Continue statin  Essential hypertension -BP stable, Continue metoprolol, lasix  Prediabetes/elevated hemoglobin A1c -Hemoglobin A1c 6.6 -Suspect secondary to steroid therapy -Given his advanced age, would recommend dietary restriction  Procedures: Echocardiogram Transcranial Doppler  Consultations: Neurology Cardiology Infectious disease Hematology/oncology  Discharge Exam: Vitals:   09/21/19 0554 09/21/19 0739  BP: (!) 171/84 (!) 156/86  Pulse: (!) 58 61  Resp:  16  Temp:  98.6 F (37 C)  SpO2: 93% 94%     General: Well developed, well nourished, NAD, appears stated age  HEENT: NCAT, mucous membranes moist.  Cardiovascular: S1 S2 auscultated, RRR, 3/6SEM  Respiratory: Clear to auscultation bilaterally  Abdomen: Soft, nontender, nondistended, + bowel sounds  Extremities: warm dry without cyanosis clubbing or edema.  Diffuse ecchymosis on both forearms  Neuro: AAOx3, nonfocal  Psych: Appropriate mood and affect, pleasant   Discharge Instructions Discharge Instructions    [facility] pharmacist to adjust dose for Vancomycin, Aminoglycosides and other anti-infective therapies as requested by physician.   Complete by: As directed    [facility] to provide Cath Flo 29m   Complete by: As directed    Administer for PICC line occlusion and as ordered by physician for other access device issues.   Anaphylaxis Kit: Provided to treat any anaphylactic reaction to the medication being provided to the patient if First Dose or when requested by physician   Complete by: As directed    Epinephrine 171mml vial / amp: Administer 0.60m560m0.60ml72mubcutaneously once for moderate to severe anaphylaxis, nurse to call physician and pharmacy when reaction occurs and call 911 if needed for immediate care   Diphenhydramine 50mg36mIV vial: Administer 25-50mg 77mM PRN for first dose reaction, rash, itching, mild reaction, nurse to call  physician and pharmacy when reaction occurs   Sodium Chloride 0.9% NS 500ml I72mdminister if needed for hypovolemic  blood pressure drop or as ordered by physician after call to physician with anaphylactic reaction   Change dressing on IV access line weekly and PRN   Complete by: As directed    Discharge instructions   Complete by: As directed    Patient will be discharged to home with home health services.  Patient will need to follow up with primary care provider within one week of discharge, repeat CBC and BMP. Follow up with cardiology and urology.  Patient should continue medications as prescribed.  Patient should follow a heart healthy/carb modified diet.   Discharge wound care:   Complete by: As directed    Keep wound clean and dry   Flush IV access with Sodium Chloride 0.9% and Heparin 10 units/ml or 100 units/ml   Complete by: As directed    Home infusion instructions - [facility]   Complete by: As directed    Instructions: Flush IV access with Sodium Chloride 0.9% and Heparin 10units/ml or 100units/ml   Change dressing on IV access line: Weekly and PRN   Instructions Cath Flo 23m: Administer for PICC Line occlusion and as ordered by physician for other access device   [facility] pharmacist to adjust dose for: Vancomycin, Aminoglycosides and other anti-infective therapies as requested by physician   Method of administration may be changed at the discretion of home infusion pharmacist based upon assessment of the patient and/or caregiver's ability to self-administer the medication ordered   Complete by: As directed      Allergies as of 09/21/2019      Reactions   Antazoline Anaphylaxis   Antihistamines, Chlorpheniramine-type    Nervous, jittery   Sulfa Antibiotics Rash      Medication List    TAKE these medications   amoxicillin 500 MG tablet Commonly known as: AMOXIL Take 4 capsules (2,000 mg) one hour prior to all dental visits.   aspirin 81 MG EC  tablet Take 1 tablet (81 mg total) by mouth daily. Swallow whole. Start taking on: September 22, 2019   atorvastatin 20 MG tablet Commonly known as: LIPITOR TAKE 1 TABLET BY MOUTH DAILY AT 6 PM What changed: See the new instructions.   ceFEPime  IVPB Commonly known as: MAXIPIME Inject 2 g into the vein every 12 (twelve) hours. Indication:  Pseudomonal bacteremia First Dose: No Last Day of Therapy:  10/14/2019 Labs - Once weekly:  CBC/D and BMP, Labs - Every other week:  ESR and CRP Method of administration: IV Push Method of administration may be changed at the discretion of home infusion pharmacist based upon assessment of the patient and/or caregiver's ability to self-administer the medication ordered.   desonide 0.05 % cream Commonly known as: DESOWEN Apply 1 application topically 2 (two) times daily as needed (rash/irritation.).   fluocinonide 0.05 % external solution Commonly known as: LIDEX Apply 1 application topically 2 (two) times daily as needed (apply to ears).   furosemide 40 MG tablet Commonly known as: LASIX Take 0.5 tablets (20 mg total) by mouth daily. What changed: how much to take   latanoprost 0.005 % ophthalmic solution Commonly known as: XALATAN Place 1 drop into both eyes at bedtime.   Lucentis 0.5 MG/0.05ML Soln Generic drug: ranibizumab 0.5 mg by Intravitreal route every 28 (twenty-eight) days. Left Eye ONLY   metoprolol tartrate 25 MG tablet Commonly known as: LOPRESSOR Take 0.5 tablets (12.5 mg total) by mouth daily.   nitroGLYCERIN 0.4 MG SL tablet Commonly known as: Nitrostat Place 1 tablet (  0.4 mg total) under the tongue every 5 (five) minutes as needed for chest pain. Please make yearly appt with Dr. Tamala Julian for September. 1st attempt   pantoprazole 40 MG tablet Commonly known as: PROTONIX TAKE 1 TABLET BY MOUTH DAILY GENERIC EQUIVALENT FOR PROTONIX What changed: See the new instructions.   potassium chloride 10 MEQ tablet Commonly known as:  KLOR-CON Take 1 tablet (10 mEq total) by mouth daily.   predniSONE 10 MG tablet Commonly known as: DELTASONE TAKE 4 TABLETS(40 MG) BY MOUTH DAILY WITH BREAKFAST What changed: See the new instructions.   PRESERVISION AREDS 2 PO Take 1 capsule by mouth 2 (two) times daily.   sertraline 50 MG tablet Commonly known as: ZOLOFT Take 50 mg by mouth every evening.   tamsulosin 0.4 MG Caps capsule Commonly known as: FLOMAX Take 0.8 mg by mouth daily after supper.   timolol 0.5 % ophthalmic solution Commonly known as: BETIMOL Place 1 drop into the right eye daily.   triamcinolone cream 0.1 % Commonly known as: KENALOG Apply 1 application topically 2 (two) times daily as needed (itching).   Vitamin D3 50 MCG (2000 UT) Tabs Take 2,000 Units by mouth every evening.            Discharge Care Instructions  (From admission, onward)         Start     Ordered   09/21/19 0000  Change dressing on IV access line weekly and PRN  (Home infusion instructions - [facility] )        09/21/19 1055   09/21/19 0000  Discharge wound care:       Comments: Keep wound clean and dry   09/21/19 1055         Allergies  Allergen Reactions  . Antazoline Anaphylaxis  . Antihistamines, Chlorpheniramine-Type     Nervous, jittery  . Sulfa Antibiotics Rash    Follow-up Information    Festus Aloe, MD Follow up.   Specialty: Urology Contact information: [place] [address] [phone]        [name], MD. Schedule an appointment as soon as possible for a visit in 1 week(s).   Specialty: Internal Medicine Why: One week Contact information: [place] [address] [phone]        Belva Crome, MD .   Specialty: Cardiology Contact information: [phone] [place] [address] [phone]                The results of significant diagnostics from this hospitalization (including imaging,  microbiology, ancillary and laboratory) are listed below for reference.    Significant Diagnostic Studies: DG Chest 2 View  Result Date: 09/02/2019 CLINICAL DATA:  Preop evaluation for upcoming aortic surgery EXAM: CHEST - 2 VIEW COMPARISON:  08/12/2019 FINDINGS: Cardiac shadow is mildly enlarged. Stable left upper lobe lung mass is noted. No focal infiltrate is seen. Degenerative changes of the thoracic spine are noted. IMPRESSION: Stable left upper lobe mass.  No new focal abnormality is noted. Electronically Signed   By: Inez Catalina M.D.   On: 09/02/2019 15:34   DG Chest Port 1 View  Result Date: 09/16/2019 CLINICAL DATA:  Postop fever. EXAM: PORTABLE CHEST 1 VIEW COMPARISON:  Radiograph 09/06/2019, CT 07/29/2019 FINDINGS: Post TAVR. Previous right internal jugular pacer is been removed. Battery pack projects over the left chest wall. Stable cardiomegaly. Aortic atherosclerosis. Central left lung mass is unchanged by radiograph. No evidence of  acute airspace disease. No pleural fluid. No pneumothorax. No pulmonary edema. IMPRESSION: 1. No acute abnormality. 2. Stable mild cardiomegaly and left lung mass. Electronically Signed   By: Keith Rake M.D.   On: 09/16/2019 14:02   DG Chest Port 1 View  Result Date: 09/06/2019 CLINICAL DATA:  Status post TAVR EXAM: PORTABLE CHEST 1 VIEW COMPARISON:  09/02/2019 FINDINGS: Status post TAVR. There is a right IJ approach sheath with a pacer lead terminating in the right ventricle. Left chest mass is unchanged. No focal airspace consolidation or pleural effusion. IMPRESSION: Status post TAVR without focal airspace disease. Electronically Signed   By: Ulyses Jarred M.D.   On: 09/06/2019 19:07   ECHOCARDIOGRAM COMPLETE  Result Date: 09/17/2019    ECHOCARDIOGRAM REPORT   Patient Name:   [name] Date of Exam: 09/17/2019 Medical Rec #:  [id number]     Height:       64.0 in Accession #:    [id number]    Weight:       175.3 lb Date of Birth:  December 31, 1923     BSA:          1.850 m Patient Age:    31 years      BP:           107/56 mmHg Patient Gender: M             HR:           63 bpm. Exam Location:  Inpatient Procedure: 2D Echo, Cardiac Doppler, Color Doppler and Intracardiac            Opacification Agent Indications:    Stroke 434.91 / I163.9  History:        Patient has prior history of Echocardiogram examinations, most                 recent 09/07/2019. CAD, Stroke; Risk Factors:Hypertension,                 Dyslipidemia and Former Smoker. GERD.                 Aortic Valve: 26 mm Edwards Sapien prosthetic, stented (TAVR)                 valve is present in the aortic position. Procedure Date:                 09/06/2019.  Sonographer:    Vickie Epley RDCS Referring Phys: [id number] [place]  1. Left ventricular ejection fraction, by estimation, is 60 to 65%. The left ventricle has normal function. The left ventricle has no regional wall motion abnormalities. Left ventricular diastolic parameters are consistent with Grade I diastolic dysfunction (impaired relaxation). Elevated left atrial pressure.  2. Right ventricular systolic function is normal. The right ventricular size is normal. There is normal pulmonary artery systolic pressure.  3. The mitral valve is normal in structure. Trivial mitral valve regurgitation. No evidence of mitral stenosis.  4. Edwards Sapien 3 THV size 26 mm, model #[id number] is in the AV position. Mildly elevated mean gradient of 25 mmHg based on published normals, has signifcantly increased from prior study 09/07/19. The LVOT/ AV VTI ratio has also decreased. Limited morphological visualization of prosthetic valve in general with TTE, no gross visualized thrombus or vegetation. Given presentation with bacteremia, CVA, and increased gradient across prosthetic valve would recommend TEE for better visualization. . The aortic valve has been repaired/replaced. Aortic valve regurgitation is not visualized.  There is a 26 mm Edwards  Sapien prosthetic (TAVR) valve present in the aortic position. Procedure Date: 09/06/2019.  5. The inferior vena cava is normal in size with greater than 50% respiratory variability, suggesting right atrial pressure of 3 mmHg. FINDINGS  Left Ventricle: Left ventricular ejection fraction, by estimation, is 60 to 65%. The left ventricle has normal function. The left ventricle has no regional wall motion abnormalities. Definity contrast agent was given IV to delineate the left ventricular  endocardial borders. The left ventricular internal cavity size was normal in size. There is no left ventricular hypertrophy. Left ventricular diastolic parameters are consistent with Grade I diastolic dysfunction (impaired relaxation). Elevated left atrial pressure. Right Ventricle: The right ventricular size is normal. No increase in right ventricular wall thickness. Right ventricular systolic function is normal. There is normal pulmonary artery systolic pressure. The tricuspid regurgitant velocity is 1.85 m/s, and  with an assumed right atrial pressure of 3 mmHg, the estimated right ventricular systolic pressure is 62.5 mmHg. Left Atrium: Left atrial size was normal in size. Right Atrium: Right atrial size was normal in size. Pericardium: There is no evidence of pericardial effusion. Mitral Valve: The mitral valve is normal in structure. There is mild thickening of the mitral valve leaflet(s). There is mild calcification of the mitral valve leaflet(s). Mild mitral annular calcification. Trivial mitral valve regurgitation. No evidence  of mitral valve stenosis. Tricuspid Valve: The tricuspid valve is normal in structure. Tricuspid valve regurgitation is mild . No evidence of tricuspid stenosis. Aortic Valve: Edwards Sapien 3 THV size 26 mm, model #[id number] is in the AV position. Mildly elevated mean gradient of 25 mmHg based on published normals, has signifcantly increased from prior study 09/07/19. The LVOT/ AV VTI ratio has also  decreased. Limited morphological visualization of prosthetic valve in general with TTE, no gross visualized thrombus or vegetation. Given presentation with bacteremia, CVA, and increased gradient across prosthetic valve would recommend TEE for better visualization.  The aortic valve has been repaired/replaced. Aortic valve regurgitation is not visualized. Aortic valve mean gradient measures 25.2 mmHg. Aortic valve peak gradient measures 41.5 mmHg. Aortic valve area, by VTI measures 1.88 cm. There is a 26 mm Edwards Sapien prosthetic, stented (TAVR) valve present in the aortic position. Procedure Date: 09/06/2019. Pulmonic Valve: The pulmonic valve was not well visualized. Pulmonic valve regurgitation is mild. No evidence of pulmonic stenosis. Aorta: The aortic root is normal in size and structure. Pulmonary Artery: No evidence of pulmonary HTN, PASP is 27 mmHg. Venous: The inferior vena cava is normal in size with greater than 50% respiratory variability, suggesting right atrial pressure of 3 mmHg. IAS/Shunts: The interatrial septum appears to be lipomatous. No atrial level shunt detected by color flow Doppler.  LEFT VENTRICLE PLAX 2D LVIDd:         3.90 cm      Diastology LVIDs:         2.90 cm      LV e' lateral:   5.27 cm/s LV PW:         0.90 cm      LV E/e' lateral: 19.9 LV IVS:        0.90 cm      LV e' medial:    3.57 cm/s LVOT diam:     2.60 cm      LV E/e' medial:  29.4 LV SV:         132 LV SV Index:   71 LVOT Area:  5.31 cm  LV Volumes (MOD) LV vol d, MOD A2C: 91.4 ml LV vol d, MOD A4C: 116.0 ml LV vol s, MOD A2C: 47.8 ml LV vol s, MOD A4C: 44.2 ml LV SV MOD A2C:     43.6 ml LV SV MOD A4C:     116.0 ml LV SV MOD BP:      61.3 ml RIGHT VENTRICLE RV S prime:     15.70 cm/s TAPSE (M-mode): 2.3 cm LEFT ATRIUM             Index       RIGHT ATRIUM           Index LA diam:        3.40 cm 1.84 cm/m  RA Area:     10.90 cm LA Vol (A2C):   34.7 ml 18.76 ml/m RA Volume:   22.20 ml  12.00 ml/m LA Vol (A4C):    45.3 ml 24.49 ml/m LA Biplane Vol: 41.2 ml 22.27 ml/m  AORTIC VALVE AV Area (Vmax):    1.86 cm AV Area (Vmean):   1.76 cm AV Area (VTI):     1.88 cm AV Vmax:           321.96 cm/s AV Vmean:          237.994 cm/s AV VTI:            0.702 m AV Peak Grad:      41.5 mmHg AV Mean Grad:      25.2 mmHg LVOT Vmax:         113.00 cm/s LVOT Vmean:        78.700 cm/s LVOT VTI:          0.249 m LVOT/AV VTI ratio: 0.35  AORTA Ao Root diam: 3.00 cm MITRAL VALVE                TRICUSPID VALVE MV Area (PHT): 2.23 cm     TR Peak grad:   13.7 mmHg MV Decel Time: 340 msec     TR Vmax:        185.00 cm/s MV E velocity: 105.00 cm/s MV A velocity: 129.00 cm/s  SHUNTS MV E/A ratio:  0.81         Systemic VTI:  0.25 m                             Systemic Diam: 2.60 cm Carlyle Dolly MD Electronically signed by Carlyle Dolly MD Signature Date/Time: 09/17/2019/2:05:41 PM    Final    CT HEAD CODE STROKE WO CONTRAST  Result Date: 09/16/2019 CLINICAL DATA:  Code stroke. Possible stroke, neuro deficit, subacute. Right-sided facial droop, aphasia, last known well 10:00 AM EXAM: CT HEAD WITHOUT CONTRAST TECHNIQUE: Contiguous axial images were obtained from the base of the skull through the vertex without intravenous contrast. COMPARISON:  No pertinent prior studies available for comparison. FINDINGS: Brain: Mild generalized parenchymal atrophy. There is abnormal cortical/subcortical hyperdensity within the mid left frontal lobe and left frontal operculum consistent with acute/early subacute infarction. The infarct measures 2.4 x 3.7 x 3.5 cm. There is mild regional mass effect. No ventricular effacement or midline shift. No evidence of hemorrhagic conversion. Background mild chronic small vessel ischemic disease. No extra-axial fluid collection. No evidence of intracranial mass. No midline shift. Vascular: No definite hyperdense vessel. Atherosclerotic calcifications. Skull: Normal. Negative for fracture or focal lesion.  Sinuses/Orbits: Visualized orbits show no acute finding. Bilateral  lens replacements. Mild ethmoid and maxillary sinus mucosal thickening. No significant mastoid effusion. ASPECTS ([place] Stroke Program Early CT Score) - Ganglionic level infarction (caudate, lentiform nuclei, internal capsule, insula, M1-M3 cortex): 7 - Supraganglionic infarction (M4-M6 cortex): 1 Total score (0-10 with 10 being normal): 8 These results were called by telephone at the time of interpretation on 09/16/2019 at 12:06 pm to provider Dr. Rory Percy, who verbally acknowledged these results. IMPRESSION: 1. Acute/early subacute ischemic infarction within the left MCA vascular territory within the mid left frontal lobe and left frontal operculum. ASPECTS is 8. Mild regional mass effect. No midline shift. No hemorrhagic conversion. 2. Background mild generalized parenchymal atrophy and chronic small vessel ischemic disease 3. Mild ethmoid sinus mucosal thickening. Electronically Signed   By: Kellie Simmering DO   On: 09/16/2019 12:06   VAS [place] TRANSCRANIAL DOPPLER  Result Date: 09/20/2019  Transcranial Doppler Indications: Stroke. Performing Technologist: Maudry Mayhew MHA, RDMS, RVT, RDCS  Examination Guidelines: A complete evaluation includes B-mode imaging, spectral Doppler, color Doppler, and power Doppler as needed of all accessible portions of each vessel. Bilateral testing is considered an integral part of a complete examination. Limited examinations for reoccurring indications may be performed as noted.  +----------+-------------+----------+-----------+-------+ RIGHT TCD Right VM (cm)Depth (cm)PulsatilityComment +----------+-------------+----------+-----------+-------+ MCA           16.00                 1.53            +----------+-------------+----------+-----------+-------+ ACA          -21.00                 1.03            +----------+-------------+----------+-----------+-------+ PCA           20.00                  1.15            +----------+-------------+----------+-----------+-------+ Opthalmic     15.00                 1.08            +----------+-------------+----------+-----------+-------+ ICA siphon    18.00                 1.68            +----------+-------------+----------+-----------+-------+ Vertebral    -14.00                 1.38            +----------+-------------+----------+-----------+-------+  +----------+------------+----------+-----------+-------+ LEFT TCD  Left VM (cm)Depth (cm)PulsatilityComment +----------+------------+----------+-----------+-------+ MCA          31.00                 1.40            +----------+------------+----------+-----------+-------+ ACA          -14.00                1.22            +----------+------------+----------+-----------+-------+ PCA          11.00                 0.95            +----------+------------+----------+-----------+-------+ Opthalmic    15.00                 0.83            +----------+------------+----------+-----------+-------+  ICA siphon   21.00                 1.39            +----------+------------+----------+-----------+-------+ Vertebral    -19.00                1.53            +----------+------------+----------+-----------+-------+  Summary:  Suboptimal waveforms throughout due to poor bony windows throughout limit study. antegrade flow noted in all vessels identified. Globally elevated pulsatility indices along with low mean flow velocities throughout suggest diffus eintracranial atherosclerosis. *See table(s) above for TCD measurements and observations.  Diagnosing physician: Antony Contras MD Electronically signed by Antony Contras MD on 09/20/2019 at 11:48:00 AM.    Final    ECHOCARDIOGRAM LIMITED  Result Date: 09/07/2019    ECHOCARDIOGRAM REPORT   Patient Name:   [name] Date of Exam: 09/07/2019 Medical Rec #:  [id number]     Height:       64.0 in Accession #:    [id number]     Weight:       170.3 lb Date of Birth:  09-Dec-1923    BSA:          1.827 m Patient Age:    29 years      BP:           80/50 mmHg Patient Gender: M             HR:           74 bpm. Exam Location:  Inpatient Procedure: Limited Echo, Intracardiac Opacification Agent, Limited Color Doppler            and Cardiac Doppler Indications:    Post TAVR evaluation V43.3 / Z95.2  History:        Patient has prior history of Echocardiogram examinations, most                 recent 09/06/2019. CAD, Arrythmias:RBBB; Risk Factors:Dyslipidemia                 and Sleep Apnea. History of cancer. GERD. Fever. Urinary                 retention.                 Aortic Valve: 26 mm Ultra, stented (TAVR) valve is present in                 the aortic position. Procedure Date: 09/06/2019.  Sonographer:    Darlina Sicilian RDCS Referring Phys: [id number] [place]  Sonographer Comments: Gradients obtained when hypotensive IMPRESSIONS  1. Day 1 post TAVR with normal transaortic gradients with peak/mean 18/11 mmHg. Trivial paravalvular leak.  2. Left ventricular ejection fraction, by estimation, is 65 to 70%. The left ventricle has normal function. The left ventricle has no regional wall motion abnormalities. Left ventricular diastolic function could not be evaluated.  3. Right ventricular systolic function is normal. The right ventricular size is normal. There is normal pulmonary artery systolic pressure.  4. The mitral valve is normal in structure. Trivial mitral valve regurgitation. No evidence of mitral stenosis.  5. The aortic valve has been repaired/replaced. Aortic valve regurgitation is not visualized. No aortic stenosis is present. There is a 26 mm Ultra, stented (TAVR) valve present in the aortic position. Procedure Date: 09/06/2019. Echo findings are consistent with normal structure and function of the aortic valve prosthesis. Aortic  valve mean gradient measures 11.0 mmHg.  6. The inferior vena cava is normal in size with  greater than 50% respiratory variability, suggesting right atrial pressure of 3 mmHg. FINDINGS  Left Ventricle: Left ventricular ejection fraction, by estimation, is 65 to 70%. The left ventricle has normal function. The left ventricle has no regional wall motion abnormalities. Definity contrast agent was given IV to delineate the left ventricular  endocardial borders. The left ventricular internal cavity size was normal in size. There is no left ventricular hypertrophy. Left ventricular diastolic function could not be evaluated. Right Ventricle: The right ventricular size is normal. No increase in right ventricular wall thickness. Right ventricular systolic function is normal. There is normal pulmonary artery systolic pressure. Left Atrium: Left atrial size was normal in size. Right Atrium: Right atrial size was normal in size. Pericardium: Trivial pericardial effusion is present. There is no evidence of cardiac tamponade. Mitral Valve: The mitral valve is normal in structure. Normal mobility of the mitral valve leaflets. Trivial mitral valve regurgitation. No evidence of mitral valve stenosis. Tricuspid Valve: The tricuspid valve is normal in structure. Tricuspid valve regurgitation is trivial. No evidence of tricuspid stenosis. Aortic Valve: The aortic valve has been repaired/replaced. Aortic valve regurgitation is not visualized. No aortic stenosis is present. Aortic valve mean gradient measures 11.0 mmHg. Aortic valve peak gradient measures 18.2 mmHg. Aortic valve area, by VTI measures 3.75 cm. There is a 26 mm Ultra, stented (TAVR) valve present in the aortic position. Procedure Date: 09/06/2019. Echo findings are consistent with normal structure and function of the aortic valve prosthesis. Pulmonic Valve: The pulmonic valve was normal in structure. Pulmonic valve regurgitation is not visualized. No evidence of pulmonic stenosis. Aorta: The aortic root is normal in size and structure. Venous: The inferior  vena cava is normal in size with greater than 50% respiratory variability, suggesting right atrial pressure of 3 mmHg. IAS/Shunts: No atrial level shunt detected by color flow Doppler.  LEFT VENTRICLE PLAX 2D LVOT diam:     2.35 cm LV SV:         102 LV SV Index:   56 LVOT Area:     4.34 cm  AORTIC VALVE AV Area (Vmax):    3.38 cm AV Area (Vmean):   3.56 cm AV Area (VTI):     3.75 cm AV Vmax:           213.33 cm/s AV Vmean:          139.000 cm/s AV VTI:            0.272 m AV Peak Grad:      18.2 mmHg AV Mean Grad:      11.0 mmHg LVOT Vmax:         166.00 cm/s LVOT Vmean:        114.000 cm/s LVOT VTI:          0.235 m LVOT/AV VTI ratio: 0.87  SHUNTS Systemic VTI:  0.24 m Systemic Diam: 2.35 cm Ena Dawley MD Electronically signed by Ena Dawley MD Signature Date/Time: 09/07/2019/10:32:21 PM    Final    ECHOCARDIOGRAM LIMITED  Result Date: 09/06/2019    ECHOCARDIOGRAM LIMITED REPORT   Patient Name:   [name] Date of Exam: 09/06/2019 Medical Rec #:  [id number]     Height:       64.0 in Accession #:    [id number]    Weight:       170.8 lb Date of Birth:  04-10-1923  BSA:          1.829 m Patient Age:    28 years      BP:           207/88 mmHg Patient Gender: M             HR:           52 bpm. Exam Location:  Inpatient Procedure: Limited Echo, Color Doppler and Cardiac Doppler Indications:     Aortic Stenosis i35.0  History:         Patient has prior history of Echocardiogram examinations, most                  recent 06/13/2019. CAD; Risk Factors:Hypertension and                  Dyslipidemia.  Sonographer:     Raquel Sarna Senior RDCS Referring Phys:  [place] Diagnosing Phys: Ena Dawley MD  Sonographer Comments: TAVR Procedure for implantation of 17m Edwards Ultra Sapien Bioprosthetic IMPRESSIONS  1. This was a periprocedural TTE during a TAVR procedure. A 26 mm Edwards-SAPIEN 3 Ultra valve was successfully deployed in the aortic position with improvement of peak/mean transaortic  gradients from 60/37 mmHg to 13/7 mmHg. No paravalvular leak and no  pericardial effusion prior or post procedure.  2. Left ventricular ejection fraction, by estimation, is 60 to 65%. The left ventricle has normal function. The left ventricle has no regional wall motion abnormalities. There is mild concentric left ventricular hypertrophy.  3. Right ventricular systolic function is normal. The right ventricular size is normal. There is normal pulmonary artery systolic pressure.  4. The mitral valve is normal in structure. Mild mitral valve regurgitation. No evidence of mitral stenosis.  5. The aortic valve is normal in structure. Aortic valve regurgitation is moderate. Severe aortic valve stenosis. Aortic valve mean gradient measures 37.0 mmHg.  6. The inferior vena cava is normal in size with greater than 50% respiratory variability, suggesting right atrial pressure of 3 mmHg. FINDINGS  Left Ventricle: Left ventricular ejection fraction, by estimation, is 60 to 65%. The left ventricle has normal function. The left ventricle has no regional wall motion abnormalities. The left ventricular internal cavity size was normal in size. There is  mild concentric left ventricular hypertrophy. Right Ventricle: The right ventricular size is normal. No increase in right ventricular wall thickness. Right ventricular systolic function is normal. There is normal pulmonary artery systolic pressure. Left Atrium: Left atrial size was normal in size. Right Atrium: Right atrial size was normal in size. Pericardium: There is no evidence of pericardial effusion. Mitral Valve: The mitral valve is normal in structure. Normal mobility of the mitral valve leaflets. Mild mitral valve regurgitation. No evidence of mitral valve stenosis. Tricuspid Valve: The tricuspid valve is normal in structure. Tricuspid valve regurgitation is not demonstrated. No evidence of tricuspid stenosis. Aortic Valve: The aortic valve is normal in structure.. There is  severe thickening and severe calcifcation of the aortic valve. Aortic valve regurgitation is moderate. Severe aortic stenosis is present. There is severe thickening of the aortic valve. There is severe calcifcation of the aortic valve. Aortic valve mean gradient measures 37.0 mmHg. Aortic valve peak gradient measures 13.1 mmHg. Aortic valve area, by VTI measures 1.75 cm. Pulmonic Valve: The pulmonic valve was normal in structure. Pulmonic valve regurgitation is not visualized. No evidence of pulmonic stenosis. Aorta: The aortic root is normal in size and structure. Venous: The inferior vena  cava is normal in size with greater than 50% respiratory variability, suggesting right atrial pressure of 3 mmHg. IAS/Shunts: No atrial level shunt detected by color flow Doppler.  LEFT VENTRICLE PLAX 2D LVOT diam:     2.00 cm LV SV:         69 LV SV Index:   38 LVOT Area:     3.14 cm  AORTIC VALVE AV Area (Vmax):    1.91 cm AV Area (Vmean):   2.12 cm AV Area (VTI):     1.75 cm AV Vmax:           181.00 cm/s AV Vmean:          125.000 cm/s AV VTI:            0.394 m AV Peak Grad:      13.1 mmHg AV Mean Grad:      37.0 mmHg LVOT Vmax:         110.00 cm/s LVOT Vmean:        84.300 cm/s LVOT VTI:          0.220 m LVOT/AV VTI ratio: 0.56  SHUNTS Systemic VTI:  0.22 m Systemic Diam: 2.00 cm Ena Dawley MD Electronically signed by Ena Dawley MD Signature Date/Time: 09/06/2019/2:39:05 PM    Final    [place] EKG SITE RITE  Result Date: 09/20/2019 If Site Rite image not attached, placement could not be confirmed due to current cardiac rhythm.  Structural Heart Procedure  Result Date: 09/06/2019 See surgical note for result.   Microbiology: Recent Results (from the past 240 hour(s))  Urine culture     Status: Abnormal   Collection Time: 09/16/19 12:30 PM   Specimen: In/Out Cath Urine  Result Value Ref Range Status   Specimen Description IN/OUT CATH URINE  Final   Special Requests   Final    NONE Performed at [facility], [address] [place], [place] [address]    Culture >=100,000 COLONIES/mL PSEUDOMONAS AERUGINOSA (A)  Final   Report Status 09/18/2019 FINAL  Final   Organism ID, Bacteria PSEUDOMONAS AERUGINOSA (A)  Final      Susceptibility   Pseudomonas aeruginosa - MIC*    CEFTAZIDIME 2 SENSITIVE Sensitive     CIPROFLOXACIN 0.5 SENSITIVE Sensitive     GENTAMICIN <=1 SENSITIVE Sensitive     IMIPENEM 2 SENSITIVE Sensitive     PIP/TAZO <=4 SENSITIVE Sensitive     CEFEPIME 2 SENSITIVE Sensitive     * >=100,000 COLONIES/mL PSEUDOMONAS AERUGINOSA  Blood Culture (routine x 2)     Status: Abnormal   Collection Time: 09/16/19 12:58 PM   Specimen: BLOOD LEFT HAND  Result Value Ref Range Status   Specimen Description BLOOD LEFT HAND  Final   Special Requests   Final    BOTTLES DRAWN AEROBIC AND ANAEROBIC Blood Culture results may not be optimal due to an inadequate volume of blood received in culture bottles   Culture  Setup Time   Final    GRAM NEGATIVE RODS AEROBIC BOTTLE ONLY CRITICAL VALUE NOTED.  VALUE IS CONSISTENT WITH PREVIOUSLY REPORTED AND CALLED VALUE.    Culture (A)  Final    PSEUDOMONAS AERUGINOSA SUSCEPTIBILITIES PERFORMED ON PREVIOUS CULTURE WITHIN THE LAST 5 DAYS. Performed at [facility], [place] [address], [place], [place] [address]    Report Status 09/19/2019 FINAL  Final  Blood Culture (routine x 2)     Status: Abnormal   Collection Time: 09/16/19  1:00 PM  Specimen: BLOOD  Result Value Ref Range Status   Specimen Description BLOOD RIGHT ANTECUBITAL  Final   Special Requests   Final    BOTTLES DRAWN AEROBIC AND ANAEROBIC Blood Culture adequate volume   Culture  Setup Time   Final    GRAM NEGATIVE RODS IN BOTH AEROBIC AND ANAEROBIC BOTTLES CRITICAL RESULT CALLED TO, READ BACK BY AND VERIFIED WITH: Rubie Maid [id number] Performed at [facility], [place] [address] [place], [place] [address]    Culture PSEUDOMONAS AERUGINOSA (A)  Final   Report  Status 09/19/2019 FINAL  Final   Organism ID, Bacteria PSEUDOMONAS AERUGINOSA  Final      Susceptibility   Pseudomonas aeruginosa - MIC*    CEFTAZIDIME 2 SENSITIVE Sensitive     CIPROFLOXACIN <=0.25 SENSITIVE Sensitive     GENTAMICIN <=1 SENSITIVE Sensitive     IMIPENEM <=0.25 SENSITIVE Sensitive     PIP/TAZO <=4 SENSITIVE Sensitive     CEFEPIME 2 SENSITIVE Sensitive     * PSEUDOMONAS AERUGINOSA  Blood Culture ID Panel (Reflexed)     Status: Abnormal   Collection Time: 09/16/19  1:00 PM  Result Value Ref Range Status   Enterococcus species NOT DETECTED NOT DETECTED Final   Listeria monocytogenes NOT DETECTED NOT DETECTED Final   Staphylococcus species NOT DETECTED NOT DETECTED Final   Staphylococcus aureus (BCID) NOT DETECTED NOT DETECTED Final   Streptococcus species NOT DETECTED NOT DETECTED Final   Streptococcus agalactiae NOT DETECTED NOT DETECTED Final   Streptococcus pneumoniae NOT DETECTED NOT DETECTED Final   Streptococcus pyogenes NOT DETECTED NOT DETECTED Final   Acinetobacter baumannii NOT DETECTED NOT DETECTED Final   Enterobacteriaceae species NOT DETECTED NOT DETECTED Final   Enterobacter cloacae complex NOT DETECTED NOT DETECTED Final   Escherichia coli NOT DETECTED NOT DETECTED Final   Klebsiella oxytoca NOT DETECTED NOT DETECTED Final   Klebsiella pneumoniae NOT DETECTED NOT DETECTED Final   Proteus species NOT DETECTED NOT DETECTED Final   Serratia marcescens NOT DETECTED NOT DETECTED Final   Carbapenem resistance NOT DETECTED NOT DETECTED Final   Haemophilus influenzae NOT DETECTED NOT DETECTED Final   Neisseria meningitidis NOT DETECTED NOT DETECTED Final   Pseudomonas aeruginosa DETECTED (A) NOT DETECTED Final    Comment: CRITICAL RESULT CALLED TO, READ BACK BY AND VERIFIED WITH: PHARMD T DANG [id number] MLM    Candida albicans NOT DETECTED NOT DETECTED Final   Candida glabrata NOT DETECTED NOT DETECTED Final   Candida krusei NOT DETECTED NOT DETECTED  Final   Candida parapsilosis NOT DETECTED NOT DETECTED Final   Candida tropicalis NOT DETECTED NOT DETECTED Final    Comment: Performed at [facility], [place] [address], [place], [place] [address]  SARS Coronavirus 2 by RT PCR (hospital order, performed in [facility] hospital lab) Nasopharyngeal Nasopharyngeal Swab     Status: None   Collection Time: 09/16/19  1:33 PM   Specimen: Nasopharyngeal Swab  Result Value Ref Range Status   SARS Coronavirus 2 NEGATIVE NEGATIVE Final    Comment: (NOTE) SARS-CoV-2 target nucleic acids are NOT DETECTED.  The SARS-CoV-2 RNA is generally detectable in upper and lower respiratory specimens during the acute phase of infection. The lowest concentration of SARS-CoV-2 viral copies this assay can detect is 250 copies / mL. A negative result does not preclude SARS-CoV-2 infection and should not be used as the sole basis for treatment or other patient management decisions.  A negative result may occur with  improper specimen collection / handling, submission of specimen other than nasopharyngeal swab, presence of viral mutation(s) within the areas targeted by this assay, and inadequate number of viral copies (<250 copies / mL). A negative result must be combined with clinical observations, patient history, and epidemiological information.  Fact Sheet for Patients:   StrictlyIdeas.no  Fact Sheet for Healthcare Providers: BankingDealers.co.za  This test is not yet approved or  cleared by the [place] FDA and has been authorized for detection and/or diagnosis of SARS-CoV-2 by FDA under an Emergency Use Authorization (EUA).  This EUA will remain in effect (meaning this test can be used) for the duration of the COVID-19 declaration under Section 564(b)(1) of the Act, 21 U.S.C. section 360bbb-3(b)(1), unless the authorization is terminated or revoked sooner.  Performed at [facility], [place] [address] [place], [place] [address]   Culture, blood (Routine X 2) w Reflex to ID Panel     Status: None (Preliminary result)   Collection Time: 09/19/19  3:09 PM   Specimen: BLOOD LEFT HAND  Result Value Ref Range Status   Specimen Description BLOOD LEFT HAND  Final   Special Requests   Final    BOTTLES DRAWN AEROBIC ONLY Blood Culture adequate volume   Culture   Final    NO GROWTH 2 DAYS Performed at [facility], [place] [address], [place], [place] [address]    Report Status PENDING  Incomplete  Culture, blood (Routine X 2) w Reflex to ID Panel     Status: None (Preliminary result)   Collection Time: 09/19/19  3:09 PM   Specimen: BLOOD RIGHT HAND  Result Value Ref Range Status   Specimen Description BLOOD RIGHT HAND  Final   Special Requests   Final    BOTTLES DRAWN AEROBIC ONLY Blood Culture adequate volume   Culture   Final    NO GROWTH 2 DAYS Performed at [facility], [place] [address], [place], [place] [address]    Report Status PENDING  Incomplete     Labs: Basic Metabolic Panel: Recent Labs  Lab 09/17/19 0752 09/18/19 0511 09/19/19 0838 09/20/19 0234 09/21/19 0553  NA 134* 131* 133* 131* 132*  K 3.6 3.5 3.4* 4.4 3.8  CL 100 99 102 100 99  CO2 21* '23 23 23 24  ' GLUCOSE 91 154* 169* 171* 125*  BUN '20 22 19 23 ' 29*  CREATININE 1.51* 1.23 1.08 1.02 1.19  CALCIUM 7.8* 7.7* 8.3* 8.3* 8.3*  MG  --   --   --  1.6* 1.9   Liver Function Tests: Recent Labs  Lab 09/16/19 1146 09/18/19 0511  AST 24 19  ALT 27 21  ALKPHOS 51 59  BILITOT 1.3* 0.5  PROT 4.9* 4.6*  ALBUMIN 2.8* 2.2*   No results for input(s): LIPASE, AMYLASE in the last 168 hours. No results for input(s): AMMONIA in the last 168 hours. CBC: Recent Labs  Lab 09/16/19 1146 09/16/19 1151 09/17/19 0752 09/18/19 0511 09/19/19 0838 09/20/19 0234 09/21/19 0553  WBC 36.1*   < > 26.2* 25.3* 19.9* 18.3* 22.2*  NEUTROABS 28.3*  --  21.3*  --   --   --   --   HGB 10.6*   < > 10.4* 10.1* 10.5*  10.6* 10.7*  HCT 33.6*   < > 32.7* 31.0* 33.1* 32.2* 33.7*  MCV 81.6   < > 79.4* 78.1* 78.3* 78.2* 79.5*  PLT 62*   < > 73* 57* 60* 54* 61*   < > =  values in this interval not displayed.   Cardiac Enzymes: No results for input(s): CKTOTAL, CKMB, CKMBINDEX, TROPONINI in the last 168 hours. BNP: BNP (last 3 results) Recent Labs    08/12/19 1424 09/02/19 0847  BNP 321.1* 296.9*    ProBNP (last 3 results) No results for input(s): PROBNP in the last 8760 hours.  CBG: Recent Labs  Lab 09/16/19 1146  GLUCAP 78       Signed:  Breelle Hollywood  [facility] 09/21/2019, 10:55 AM

## 2019-09-21 NOTE — Care Management Important Message (Signed)
Important Message  Patient Details  Name: [name] MRN: [id number] Date of Birth: 1924-02-20   Medicare Important Message Given:  Yes     Orbie Pyo 09/21/2019, 12:16 PM

## 2019-09-21 NOTE — Plan of Care (Signed)

## 2019-09-22 ENCOUNTER — Telehealth: Payer: Self-pay | Admitting: *Deleted

## 2019-09-22 ENCOUNTER — Encounter: Payer: Self-pay | Admitting: *Deleted

## 2019-09-22 ENCOUNTER — Other Ambulatory Visit: Payer: Self-pay | Admitting: *Deleted

## 2019-09-22 DIAGNOSIS — R7303 Prediabetes: Secondary | ICD-10-CM | POA: Diagnosis not present

## 2019-09-22 DIAGNOSIS — I480 Paroxysmal atrial fibrillation: Secondary | ICD-10-CM | POA: Diagnosis not present

## 2019-09-22 DIAGNOSIS — M47816 Spondylosis without myelopathy or radiculopathy, lumbar region: Secondary | ICD-10-CM | POA: Diagnosis not present

## 2019-09-22 DIAGNOSIS — E871 Hypo-osmolality and hyponatremia: Secondary | ICD-10-CM | POA: Diagnosis not present

## 2019-09-22 DIAGNOSIS — D509 Iron deficiency anemia, unspecified: Secondary | ICD-10-CM | POA: Diagnosis not present

## 2019-09-22 DIAGNOSIS — F419 Anxiety disorder, unspecified: Secondary | ICD-10-CM | POA: Diagnosis not present

## 2019-09-22 DIAGNOSIS — I2510 Atherosclerotic heart disease of native coronary artery without angina pectoris: Secondary | ICD-10-CM | POA: Diagnosis not present

## 2019-09-22 DIAGNOSIS — R338 Other retention of urine: Secondary | ICD-10-CM | POA: Diagnosis not present

## 2019-09-22 DIAGNOSIS — B965 Pseudomonas (aeruginosa) (mallei) (pseudomallei) as the cause of diseases classified elsewhere: Secondary | ICD-10-CM | POA: Diagnosis not present

## 2019-09-22 DIAGNOSIS — D696 Thrombocytopenia, unspecified: Secondary | ICD-10-CM | POA: Diagnosis not present

## 2019-09-22 DIAGNOSIS — M810 Age-related osteoporosis without current pathological fracture: Secondary | ICD-10-CM | POA: Diagnosis not present

## 2019-09-22 DIAGNOSIS — Z452 Encounter for adjustment and management of vascular access device: Secondary | ICD-10-CM | POA: Diagnosis not present

## 2019-09-22 DIAGNOSIS — N390 Urinary tract infection, site not specified: Secondary | ICD-10-CM | POA: Diagnosis not present

## 2019-09-22 DIAGNOSIS — R011 Cardiac murmur, unspecified: Secondary | ICD-10-CM | POA: Diagnosis not present

## 2019-09-22 DIAGNOSIS — M5136 Other intervertebral disc degeneration, lumbar region: Secondary | ICD-10-CM | POA: Diagnosis not present

## 2019-09-22 DIAGNOSIS — I350 Nonrheumatic aortic (valve) stenosis: Secondary | ICD-10-CM | POA: Diagnosis not present

## 2019-09-22 DIAGNOSIS — K219 Gastro-esophageal reflux disease without esophagitis: Secondary | ICD-10-CM | POA: Diagnosis not present

## 2019-09-22 DIAGNOSIS — E785 Hyperlipidemia, unspecified: Secondary | ICD-10-CM | POA: Diagnosis not present

## 2019-09-22 DIAGNOSIS — N401 Enlarged prostate with lower urinary tract symptoms: Secondary | ICD-10-CM | POA: Diagnosis not present

## 2019-09-22 DIAGNOSIS — E876 Hypokalemia: Secondary | ICD-10-CM | POA: Diagnosis not present

## 2019-09-22 DIAGNOSIS — I110 Hypertensive heart disease with heart failure: Secondary | ICD-10-CM | POA: Diagnosis not present

## 2019-09-22 DIAGNOSIS — A4152 Sepsis due to Pseudomonas: Secondary | ICD-10-CM | POA: Diagnosis not present

## 2019-09-22 DIAGNOSIS — R918 Other nonspecific abnormal finding of lung field: Secondary | ICD-10-CM | POA: Diagnosis not present

## 2019-09-22 DIAGNOSIS — I5032 Chronic diastolic (congestive) heart failure: Secondary | ICD-10-CM | POA: Diagnosis not present

## 2019-09-22 DIAGNOSIS — H409 Unspecified glaucoma: Secondary | ICD-10-CM | POA: Diagnosis not present

## 2019-09-22 NOTE — Patient Outreach (Signed)
[facility]) Care Management  09/22/2019  [name] [birth year] [id number]   Telephone Assessment-Enrolled-HTN Transition of care to be completed by provider office  RN spoke with pt today and introduced [facility] services and the purpose for today's call. Discussed his recent discharged and current agencies involved with his ongoing care. [facility] will address his PICC with IV therapy.  Confirmed he has all his medications and taking as prescribed. Pt has sufficient transportation to attend all medical appointments.  Pt states he is using his walker at this time. Denies any falls or acute issues at this time. Pt has a very supportive wife in the home and she is assisting pt in managing his ongoing health. Pt also has a foley and will follow up with a urologist. Caregiver is aware of who to call and has all contact numbers for the providers. RN offered enrollment into the [facility] program to continue to follow pt under the program HTN. Discussed and generated a plan of care related with all goals and interventions. Pt receptive and agreed to work with RN case manager on improving his knowledge base for managing his his HTN. Agreed to monitoring his BP closely with blood pressures checks and aware who to call if acute. Reports today's blood pressures at 116/78 and denies any participating symptoms related to HTN. Educated on HTN and HTN crises and what to do if any related symptoms. Offered to send printable material on HTN to assist pt with increasing his knowledge base related to HTN (receptive).  Plan: Will send appropriate letters to both the pt and his provider concerning [facility] involvement. Will follow up next month if not sooner with ongoing disease management services and update plan on pt's progress. No further questions or inquires at this time.   Goals Addressed              This Visit's Progress   .  I wanta get better (pt-stated)        CARE PLAN ENTRY (see  longtitudinal plan of care for additional care plan information)  Objective:  . Last practice recorded BP readings:  BP Readings from Last 3 Encounters:  09/21/19 116/73  09/14/19 (!) 142/74  09/13/19 134/70 .   Marland Kitchen Most recent eGFR/CrCl: No results found for: EGFR  No components found for: CRCL  Current Barriers:  Marland Kitchen Knowledge Deficits related to basic understanding of hypertension diagnosis . Knowledge Deficits related to understanding of medications prescribed for management of hypertension . Knowledge deficit related to dangers of uncontrolled hypertension  Case Manager Clinical Goal(s):  Marland Kitchen Over the next 90 days, patient will verbalize understanding of plan for hypertension management . Over the next 30 days, patient will attend all scheduled medical appointments: all appointments were reviewed today with verification of sufficient transportation. . Over the next 30 days, patient will demonstrate improved adherence to prescribed treatment plan for hypertension as evidenced by taking all medications as prescribed, monitoring and recording blood pressure as directed, adhering to low sodium/DASH diet . Over the next 30 days, patient will verbalize basic understanding of hypertension disease process and self health management plan as evidenced by report normal blood pressures  Interventions:  . Evaluation of current treatment plan related to hypertension self management and patient's adherence to plan as established by provider. . Reviewed medications with patient and discussed importance of compliance . Discussed plans with patient for ongoing care management follow up and provided patient with direct contact information for care management  team . Advised patient, providing education and rationale, to monitor blood pressure daily and record, calling PCP for findings outside established parameters.  . Provided education regarding s/s of stroke and stroke prevention . Provided education  regarding s/s of heart attack . Provided education regarding s/s of DASH diet/Low salt diet . Provided education regarding complications of uncontrolled blood pressure . Provided education regarding s/s hypertensive crisis  Patient Self Care Activities:  . Self administers medications as prescribed . Attends all scheduled provider appointments . Calls provider office for new concerns, questions, or BP outside discussed parameters . Monitors BP and records as discussed . Adheres to a low sodium diet/DASH diet  Initial goal documentation         [name], RN Care Management Coordinator [place] Office [phone]

## 2019-09-22 NOTE — Telephone Encounter (Signed)
Called to inquire if they should keep appointment on 7/23 for lab/OV/injection since Nplate was given in hospital on 09/18/19? Wanted to confirm prednisone dose--30 mg per BS pre-hospitalization or 40 mg per discharge papers? Per Dr. Benay Spice: OK to come 7/23 and have injection 2 days early or can come Monday. Prednisone dose is 30 mg. Wife notified and they agreed to keep visit on 7/23 since home health RN coming Monday morning.

## 2019-09-23 ENCOUNTER — Inpatient Hospital Stay: Payer: Medicare Other

## 2019-09-23 ENCOUNTER — Inpatient Hospital Stay (HOSPITAL_BASED_OUTPATIENT_CLINIC_OR_DEPARTMENT_OTHER): Payer: Medicare Other | Admitting: Oncology

## 2019-09-23 ENCOUNTER — Other Ambulatory Visit: Payer: Self-pay

## 2019-09-23 ENCOUNTER — Telehealth: Payer: Self-pay

## 2019-09-23 VITALS — BP 117/71 | HR 71 | Temp 97.9°F | Resp 17 | Ht 64.0 in | Wt 162.1 lb

## 2019-09-23 DIAGNOSIS — I480 Paroxysmal atrial fibrillation: Secondary | ICD-10-CM | POA: Diagnosis not present

## 2019-09-23 DIAGNOSIS — D693 Immune thrombocytopenic purpura: Secondary | ICD-10-CM

## 2019-09-23 DIAGNOSIS — N390 Urinary tract infection, site not specified: Secondary | ICD-10-CM | POA: Diagnosis not present

## 2019-09-23 DIAGNOSIS — I110 Hypertensive heart disease with heart failure: Secondary | ICD-10-CM | POA: Diagnosis not present

## 2019-09-23 DIAGNOSIS — I639 Cerebral infarction, unspecified: Secondary | ICD-10-CM

## 2019-09-23 DIAGNOSIS — I2510 Atherosclerotic heart disease of native coronary artery without angina pectoris: Secondary | ICD-10-CM | POA: Diagnosis not present

## 2019-09-23 DIAGNOSIS — Z452 Encounter for adjustment and management of vascular access device: Secondary | ICD-10-CM | POA: Diagnosis not present

## 2019-09-23 DIAGNOSIS — D696 Thrombocytopenia, unspecified: Secondary | ICD-10-CM

## 2019-09-23 DIAGNOSIS — B965 Pseudomonas (aeruginosa) (mallei) (pseudomallei) as the cause of diseases classified elsewhere: Secondary | ICD-10-CM | POA: Diagnosis not present

## 2019-09-23 LAB — BASIC METABOLIC PANEL - CANCER CENTER ONLY
Anion gap: 13 (ref 5–15)
BUN: 30 mg/dL — ABNORMAL HIGH (ref 8–23)
CO2: 19 mmol/L — ABNORMAL LOW (ref 22–32)
Calcium: 9.1 mg/dL (ref 8.9–10.3)
Chloride: 103 mmol/L (ref 98–111)
Creatinine: 1.2 mg/dL (ref 0.61–1.24)
GFR, Est AFR Am: >60 mL/min (ref >60)
GFR, Estimated: 52 mL/min — ABNORMAL LOW (ref >60)
Glucose, Bld: 108 mg/dL — ABNORMAL HIGH (ref 70–99)
Potassium: 4.3 mmol/L (ref 3.5–5.1)
Sodium: 135 mmol/L (ref 135–145)

## 2019-09-23 LAB — CBC WITH DIFFERENTIAL (CANCER CENTER ONLY)
Abs Immature Granulocytes: 7.3 10*3/uL — ABNORMAL HIGH (ref 0.00–0.07)
Basophils Absolute: 0.1 10*3/uL (ref 0.0–0.1)
Basophils Relative: 0 %
Eosinophils Absolute: 0.1 10*3/uL (ref 0.0–0.5)
Eosinophils Relative: 0 %
HCT: 36.1 % — ABNORMAL LOW (ref 39.0–52.0)
Hemoglobin: 12 g/dL — ABNORMAL LOW (ref 13.0–17.0)
Immature Granulocytes: 30 %
Lymphocytes Relative: 4 %
Lymphs Abs: 0.9 10*3/uL (ref 0.7–4.0)
MCH: 25.5 pg — ABNORMAL LOW (ref 26.0–34.0)
MCHC: 33.2 g/dL (ref 30.0–36.0)
MCV: 76.8 fL — ABNORMAL LOW (ref 80.0–100.0)
Monocytes Absolute: 3.3 10*3/uL — ABNORMAL HIGH (ref 0.1–1.0)
Monocytes Relative: 14 %
Neutro Abs: 12.5 10*3/uL — ABNORMAL HIGH (ref 1.7–7.7)
Neutrophils Relative %: 52 %
Platelet Count: 75 10*3/uL — ABNORMAL LOW (ref 150–400)
RBC: 4.7 MIL/uL (ref 4.22–5.81)
RDW: 18.7 % — ABNORMAL HIGH (ref 11.5–15.5)
WBC Count: 24.2 10*3/uL — ABNORMAL HIGH (ref 4.0–10.5)
nRBC: 0.2 % (ref 0.0–0.2)

## 2019-09-23 MED ORDER — ROMIPLOSTIM 125 MCG ~~LOC~~ SOLR
80.0000 ug | Freq: Once | SUBCUTANEOUS | Status: AC
  Administered 2019-09-23: 80 ug via SUBCUTANEOUS
  Filled 2019-09-23: qty 0.16

## 2019-09-23 NOTE — Patient Instructions (Signed)
Romiplostim injection What is this medicine? ROMIPLOSTIM (roe mi PLOE stim) helps your body make more platelets. This medicine is used to treat low platelets caused by chronic idiopathic thrombocytopenic purpura (ITP). This medicine may be used for other purposes; ask your health care provider or pharmacist if you have questions. COMMON BRAND NAME(S): Nplate What should I tell my health care provider before I take this medicine? They need to know if you have any of these conditions:  bleeding disorders  bone marrow problem, like blood cancer or myelodysplastic syndrome  history of blood clots  liver disease  surgery to remove your spleen  an unusual or allergic reaction to romiplostim, mannitol, other medicines, foods, dyes, or preservatives  pregnant or trying to get pregnant  breast-feeding How should I use this medicine? This medicine is for injection under the skin. It is given by a health care professional in a hospital or clinic setting. A special MedGuide will be given to you before your injection. Read this information carefully each time. Talk to your pediatrician regarding the use of this medicine in children. While this drug may be prescribed for children as young as 1 year for selected conditions, precautions do apply. Overdosage: If you think you have taken too much of this medicine contact a poison control center or emergency room at once. NOTE: This medicine is only for you. Do not share this medicine with others. What if I miss a dose? It is important not to miss your dose. Call your doctor or health care professional if you are unable to keep an appointment. What may interact with this medicine? Interactions are not expected. This list may not describe all possible interactions. Give your health care provider a list of all the medicines, herbs, non-prescription drugs, or dietary supplements you use. Also tell them if you smoke, drink alcohol, or use illegal drugs.  Some items may interact with your medicine. What should I watch for while using this medicine? Your condition will be monitored carefully while you are receiving this medicine. Visit your prescriber or health care professional for regular checks on your progress and for the needed blood tests. It is important to keep all appointments. What side effects may I notice from receiving this medicine? Side effects that you should report to your doctor or health care professional as soon as possible:  allergic reactions like skin rash, itching or hives, swelling of the face, lips, or tongue  signs and symptoms of bleeding such as bloody or black, tarry stools; red or dark brown urine; spitting up blood or brown material that looks like coffee grounds; red spots on the skin; unusual bruising or bleeding from the eyes, gums, or nose  signs and symptoms of a blood clot such as chest pain; shortness of breath; pain, swelling, or warmth in the leg  signs and symptoms of a stroke like changes in vision; confusion; trouble speaking or understanding; severe headaches; sudden numbness or weakness of the face, arm or leg; trouble walking; dizziness; loss of balance or coordination Side effects that usually do not require medical attention (report to your doctor or health care professional if they continue or are bothersome):  headache  pain in arms and legs  pain in mouth  stomach pain This list may not describe all possible side effects. Call your doctor for medical advice about side effects. You may report side effects to FDA at 1-800-FDA-1088. Where should I keep my medicine? This drug is given in a hospital or clinic   and will not be stored at home. NOTE: This sheet is a summary. It may not cover all possible information. If you have questions about this medicine, talk to your doctor, pharmacist, or health care provider.  2020 Elsevier/Gold Standard (2017-02-16 11:10:55)  

## 2019-09-23 NOTE — Telephone Encounter (Signed)
PROGRESS NOTE: Sent schedule message to get patient scheduled for flush on 09/27/19. Message sent

## 2019-09-23 NOTE — Progress Notes (Signed)
[name] OFFICE PROGRESS NOTE   Diagnosis: ITP  INTERVAL HISTORY:   [name] returns for a scheduled visit.  He was admitted on 09/16/2019 with a CVA and Pseudomonas urosepsis.  He was discharged from the hospital 09/21/2019.  He is completing an outpatient course of cefepime via a right arm PICC.  He is here today with his wife.  She reports the stroke symptoms have resolved other than mild memory loss.  No bleeding.  He received Nplate on 2/67/1245.  [name] is ambulating with a walker.  He is participating with home physical therapy.  Objective:  Vital signs in last 24 hours:  Blood pressure 117/71, pulse 71, temperature 97.9 F (36.6 C), temperature source Temporal, resp. rate 17, height '5\' 4"'  (1.626 m), weight 162 lb 1.6 oz (73.5 kg), SpO2 95 %.    HEENT: No thrush Resp: End inspiratory rales at the bases, no respiratory distress Cardio: Regular rate and rhythm Vascular: No leg edema Neuro: Alert and oriented Skin: Resolving ecchymoses over the bilateral lower abdomen/groin.  The catheterization puncture site at the right groin has mild oozing and a small surrounding hematoma, gauze dressing applied  Portacath/PICC-without erythema  Lab Results:  Lab Results  Component Value Date   WBC 24.2 (H) 09/23/2019   HGB 12.0 (L) 09/23/2019   HCT 36.1 (L) 09/23/2019   MCV 76.8 (L) 09/23/2019   PLT 75 (L) 09/23/2019   NEUTROABS 12.5 (H) 09/23/2019    CMP  Lab Results  Component Value Date   NA 135 09/23/2019   K 4.3 09/23/2019   CL 103 09/23/2019   CO2 19 (L) 09/23/2019   GLUCOSE 108 (H) 09/23/2019   BUN 30 (H) 09/23/2019   CREATININE 1.20 09/23/2019   CALCIUM 9.1 09/23/2019   PROT 4.6 (L) 09/18/2019   ALBUMIN 2.2 (L) 09/18/2019   AST 19 09/18/2019   ALT 21 09/18/2019   ALKPHOS 59 09/18/2019   BILITOT 0.5 09/18/2019   GFRNONAA 52 (L) 09/23/2019   GFRAA >60 09/23/2019     Imaging:  [place] EKG SITE RITE  Result Date: 09/20/2019 If Site  Rite image not attached, placement could not be confirmed due to current cardiac rhythm.   Medications: I have reviewed the patient's current medications.   Assessment/Plan: 1. Thrombocytopenia ? Bone marrow biopsy 07/08/2016-no evidence of B-cell lymphoma, 40% cellular bone marrow with trilineage hematopoiesis, megakaryocytes present with normal morphology, normal cytogenetics, negative for BRAF mutation ? Flow cytometry 01/05/2009-no monoclonal B-cell or phenotypically abnormal T-cell population ? Prednisone starting 06/29/2019 ? Nplate 07/04/2019, 10/10/9831,ASNKNL 07/18/2019 ? Prednisone 20 mg daily beginning 07/25/2019 ? Platelets 11,000 on 08/15/2019, prednisone increased to 40 mg daily, Nplate resume 9/76/7341  ? Prednisone decreased to 30 mg daily 08/25/2019, Nplate continued ? Prednisone continued at 30 mg daily 09/13/2019, weekly Nplate continued ? Prednisone taper to 20 mg daily 09/23/2019, weekly Nplate continued 2. Hairy cell leukemia 1982, status post splenectomy 3. Coronary artery disease 4. Aortic stenosis 5. 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 Macular degeneration 7. Hearing loss 8. Basal cell carcinoma left cheek 05/24/2019 9. Left upper lobe nodule, faint FDG activity on PET at [facility] 05/31/2013 10. Status post TAVR procedure 09/06/2019 11. Admission with Pseudomonas urosepsis 09/16/2019 12. Acute left MCA distribution CVA 09/16/2019     Disposition: [name] has thrombocytopenia, likely secondary to chronic ITP.  The thrombocytopenia has responded to steroids and Nplate.  He was admitted last week with Pseudomonas urosepsis and a left frontal  CVA.  He had been off of aspirin for 2-3 days prior to hospital admission. The CVA was likely related to an embolism from the recent TAVR procedure or atrial fibrillation.  He has a history of atrial fibrillation following the TAVR procedure.  It is unclear to me whether he should receive anticoagulation therapy for the  atrial fibrillation and recent CVA.  I will defer a decision on beginning a DOAC to cardiology and neurology.  We can try to keep the platelet count at the current level if he needs anticoagulation therapy.  There is mild oozing at the right femoral puncture site.  I applied a new dressing.  I recommended he seek medical attention if he bleeds through the gauze dressing.  [name] will return for an office visit and Nplate in 1 week.  We tapered the prednisone to 20 mg daily.  The plan will be to convert him to [facility] for daily oral dosing if he requires long-term anticoagulation.  Betsy Coder, MD  09/23/2019  1:59 PM

## 2019-09-24 DIAGNOSIS — I110 Hypertensive heart disease with heart failure: Secondary | ICD-10-CM | POA: Diagnosis not present

## 2019-09-24 DIAGNOSIS — I2510 Atherosclerotic heart disease of native coronary artery without angina pectoris: Secondary | ICD-10-CM | POA: Diagnosis not present

## 2019-09-24 DIAGNOSIS — I480 Paroxysmal atrial fibrillation: Secondary | ICD-10-CM | POA: Diagnosis not present

## 2019-09-24 DIAGNOSIS — Z452 Encounter for adjustment and management of vascular access device: Secondary | ICD-10-CM | POA: Diagnosis not present

## 2019-09-24 DIAGNOSIS — N390 Urinary tract infection, site not specified: Secondary | ICD-10-CM | POA: Diagnosis not present

## 2019-09-24 DIAGNOSIS — B965 Pseudomonas (aeruginosa) (mallei) (pseudomallei) as the cause of diseases classified elsewhere: Secondary | ICD-10-CM | POA: Diagnosis not present

## 2019-09-24 LAB — CULTURE, BLOOD (ROUTINE X 2)
Culture: NO GROWTH
Culture: NO GROWTH
Special Requests: ADEQUATE
Special Requests: ADEQUATE

## 2019-09-26 ENCOUNTER — Telehealth: Payer: Self-pay | Admitting: Oncology

## 2019-09-26 ENCOUNTER — Telehealth: Payer: Self-pay

## 2019-09-26 DIAGNOSIS — N390 Urinary tract infection, site not specified: Secondary | ICD-10-CM | POA: Diagnosis not present

## 2019-09-26 DIAGNOSIS — I110 Hypertensive heart disease with heart failure: Secondary | ICD-10-CM | POA: Diagnosis not present

## 2019-09-26 DIAGNOSIS — I350 Nonrheumatic aortic (valve) stenosis: Secondary | ICD-10-CM | POA: Diagnosis not present

## 2019-09-26 DIAGNOSIS — N179 Acute kidney failure, unspecified: Secondary | ICD-10-CM | POA: Diagnosis not present

## 2019-09-26 DIAGNOSIS — I5032 Chronic diastolic (congestive) heart failure: Secondary | ICD-10-CM | POA: Diagnosis not present

## 2019-09-26 DIAGNOSIS — E871 Hypo-osmolality and hyponatremia: Secondary | ICD-10-CM | POA: Diagnosis not present

## 2019-09-26 DIAGNOSIS — D696 Thrombocytopenia, unspecified: Secondary | ICD-10-CM | POA: Diagnosis not present

## 2019-09-26 DIAGNOSIS — A419 Sepsis, unspecified organism: Secondary | ICD-10-CM | POA: Diagnosis not present

## 2019-09-26 DIAGNOSIS — I2510 Atherosclerotic heart disease of native coronary artery without angina pectoris: Secondary | ICD-10-CM | POA: Diagnosis not present

## 2019-09-26 DIAGNOSIS — Z8673 Personal history of transient ischemic attack (TIA), and cerebral infarction without residual deficits: Secondary | ICD-10-CM | POA: Diagnosis not present

## 2019-09-26 DIAGNOSIS — D72829 Elevated white blood cell count, unspecified: Secondary | ICD-10-CM | POA: Diagnosis not present

## 2019-09-26 DIAGNOSIS — I480 Paroxysmal atrial fibrillation: Secondary | ICD-10-CM | POA: Diagnosis not present

## 2019-09-26 DIAGNOSIS — Z452 Encounter for adjustment and management of vascular access device: Secondary | ICD-10-CM | POA: Diagnosis not present

## 2019-09-26 DIAGNOSIS — B965 Pseudomonas (aeruginosa) (mallei) (pseudomallei) as the cause of diseases classified elsewhere: Secondary | ICD-10-CM | POA: Diagnosis not present

## 2019-09-26 NOTE — Telephone Encounter (Signed)
Scheduled appts per 7/23 los. Pt's spouse confirmed appt date and time.

## 2019-09-26 NOTE — Telephone Encounter (Signed)
Please let him know that it is perfectly okay to have dental work while he is on IV antibiotics.

## 2019-09-26 NOTE — Telephone Encounter (Signed)
Patient lost a crown or a filling from his tooth this weekend. The dentist has requested they come for office visit.  Family wonders if there is cause for concern with dental visit since he is taking IV antibiotics.  Please advise.   Laverle Patter, RN

## 2019-09-27 DIAGNOSIS — Z792 Long term (current) use of antibiotics: Secondary | ICD-10-CM | POA: Diagnosis not present

## 2019-09-27 DIAGNOSIS — I110 Hypertensive heart disease with heart failure: Secondary | ICD-10-CM | POA: Diagnosis not present

## 2019-09-27 DIAGNOSIS — B965 Pseudomonas (aeruginosa) (mallei) (pseudomallei) as the cause of diseases classified elsewhere: Secondary | ICD-10-CM | POA: Diagnosis not present

## 2019-09-27 DIAGNOSIS — Z452 Encounter for adjustment and management of vascular access device: Secondary | ICD-10-CM | POA: Diagnosis not present

## 2019-09-27 DIAGNOSIS — I2510 Atherosclerotic heart disease of native coronary artery without angina pectoris: Secondary | ICD-10-CM | POA: Diagnosis not present

## 2019-09-27 DIAGNOSIS — I480 Paroxysmal atrial fibrillation: Secondary | ICD-10-CM | POA: Diagnosis not present

## 2019-09-27 DIAGNOSIS — N390 Urinary tract infection, site not specified: Secondary | ICD-10-CM | POA: Diagnosis not present

## 2019-09-28 DIAGNOSIS — I110 Hypertensive heart disease with heart failure: Secondary | ICD-10-CM | POA: Diagnosis not present

## 2019-09-28 DIAGNOSIS — N390 Urinary tract infection, site not specified: Secondary | ICD-10-CM | POA: Diagnosis not present

## 2019-09-28 DIAGNOSIS — I2510 Atherosclerotic heart disease of native coronary artery without angina pectoris: Secondary | ICD-10-CM | POA: Diagnosis not present

## 2019-09-28 DIAGNOSIS — I480 Paroxysmal atrial fibrillation: Secondary | ICD-10-CM | POA: Diagnosis not present

## 2019-09-28 DIAGNOSIS — Z452 Encounter for adjustment and management of vascular access device: Secondary | ICD-10-CM | POA: Diagnosis not present

## 2019-09-28 DIAGNOSIS — B965 Pseudomonas (aeruginosa) (mallei) (pseudomallei) as the cause of diseases classified elsewhere: Secondary | ICD-10-CM | POA: Diagnosis not present

## 2019-09-30 ENCOUNTER — Inpatient Hospital Stay: Payer: Medicare Other

## 2019-09-30 ENCOUNTER — Encounter: Payer: Self-pay | Admitting: Nurse Practitioner

## 2019-09-30 ENCOUNTER — Other Ambulatory Visit: Payer: Self-pay

## 2019-09-30 ENCOUNTER — Inpatient Hospital Stay (HOSPITAL_BASED_OUTPATIENT_CLINIC_OR_DEPARTMENT_OTHER): Payer: Medicare Other | Admitting: Nurse Practitioner

## 2019-09-30 VITALS — BP 130/73 | HR 62 | Temp 97.8°F | Resp 18 | Ht 64.0 in | Wt 161.1 lb

## 2019-09-30 DIAGNOSIS — D693 Immune thrombocytopenic purpura: Secondary | ICD-10-CM | POA: Diagnosis not present

## 2019-09-30 DIAGNOSIS — I639 Cerebral infarction, unspecified: Secondary | ICD-10-CM | POA: Diagnosis not present

## 2019-09-30 DIAGNOSIS — I110 Hypertensive heart disease with heart failure: Secondary | ICD-10-CM | POA: Diagnosis not present

## 2019-09-30 DIAGNOSIS — I2510 Atherosclerotic heart disease of native coronary artery without angina pectoris: Secondary | ICD-10-CM | POA: Diagnosis not present

## 2019-09-30 DIAGNOSIS — Z452 Encounter for adjustment and management of vascular access device: Secondary | ICD-10-CM | POA: Diagnosis not present

## 2019-09-30 DIAGNOSIS — I480 Paroxysmal atrial fibrillation: Secondary | ICD-10-CM | POA: Diagnosis not present

## 2019-09-30 DIAGNOSIS — B965 Pseudomonas (aeruginosa) (mallei) (pseudomallei) as the cause of diseases classified elsewhere: Secondary | ICD-10-CM | POA: Diagnosis not present

## 2019-09-30 DIAGNOSIS — D696 Thrombocytopenia, unspecified: Secondary | ICD-10-CM | POA: Diagnosis not present

## 2019-09-30 DIAGNOSIS — N390 Urinary tract infection, site not specified: Secondary | ICD-10-CM | POA: Diagnosis not present

## 2019-09-30 LAB — CBC WITH DIFFERENTIAL (CANCER CENTER ONLY)
Abs Immature Granulocytes: 2.39 10*3/uL — ABNORMAL HIGH (ref 0.00–0.07)
Basophils Absolute: 0.1 10*3/uL (ref 0.0–0.1)
Basophils Relative: 0 %
Eosinophils Absolute: 0.1 10*3/uL (ref 0.0–0.5)
Eosinophils Relative: 1 %
HCT: 36.1 % — ABNORMAL LOW (ref 39.0–52.0)
Hemoglobin: 11.6 g/dL — ABNORMAL LOW (ref 13.0–17.0)
Immature Granulocytes: 10 %
Lymphocytes Relative: 5 %
Lymphs Abs: 1.1 10*3/uL (ref 0.7–4.0)
MCH: 25.3 pg — ABNORMAL LOW (ref 26.0–34.0)
MCHC: 32.1 g/dL (ref 30.0–36.0)
MCV: 78.6 fL — ABNORMAL LOW (ref 80.0–100.0)
Monocytes Absolute: 2.8 10*3/uL — ABNORMAL HIGH (ref 0.1–1.0)
Monocytes Relative: 12 %
Neutro Abs: 17 10*3/uL — ABNORMAL HIGH (ref 1.7–7.7)
Neutrophils Relative %: 72 %
Platelet Count: 210 10*3/uL (ref 150–400)
RBC: 4.59 MIL/uL (ref 4.22–5.81)
RDW: 20.4 % — ABNORMAL HIGH (ref 11.5–15.5)
WBC Count: 23.5 10*3/uL — ABNORMAL HIGH (ref 4.0–10.5)
nRBC: 0 % (ref 0.0–0.2)

## 2019-09-30 MED ORDER — ROMIPLOSTIM 125 MCG ~~LOC~~ SOLR
80.0000 ug | Freq: Once | SUBCUTANEOUS | Status: AC
  Administered 2019-09-30: 80 ug via SUBCUTANEOUS
  Filled 2019-09-30: qty 0.16

## 2019-09-30 NOTE — Patient Instructions (Signed)
Romiplostim injection What is this medicine? ROMIPLOSTIM (roe mi PLOE stim) helps your body make more platelets. This medicine is used to treat low platelets caused by chronic idiopathic thrombocytopenic purpura (ITP). This medicine may be used for other purposes; ask your health care provider or pharmacist if you have questions. COMMON BRAND NAME(S): Nplate What should I tell my health care provider before I take this medicine? They need to know if you have any of these conditions:  bleeding disorders  bone marrow problem, like blood cancer or myelodysplastic syndrome  history of blood clots  liver disease  surgery to remove your spleen  an unusual or allergic reaction to romiplostim, mannitol, other medicines, foods, dyes, or preservatives  pregnant or trying to get pregnant  breast-feeding How should I use this medicine? This medicine is for injection under the skin. It is given by a health care professional in a hospital or clinic setting. A special MedGuide will be given to you before your injection. Read this information carefully each time. Talk to your pediatrician regarding the use of this medicine in children. While this drug may be prescribed for children as young as 1 year for selected conditions, precautions do apply. Overdosage: If you think you have taken too much of this medicine contact a poison control center or emergency room at once. NOTE: This medicine is only for you. Do not share this medicine with others. What if I miss a dose? It is important not to miss your dose. Call your doctor or health care professional if you are unable to keep an appointment. What may interact with this medicine? Interactions are not expected. This list may not describe all possible interactions. Give your health care provider a list of all the medicines, herbs, non-prescription drugs, or dietary supplements you use. Also tell them if you smoke, drink alcohol, or use illegal drugs.  Some items may interact with your medicine. What should I watch for while using this medicine? Your condition will be monitored carefully while you are receiving this medicine. Visit your prescriber or health care professional for regular checks on your progress and for the needed blood tests. It is important to keep all appointments. What side effects may I notice from receiving this medicine? Side effects that you should report to your doctor or health care professional as soon as possible:  allergic reactions like skin rash, itching or hives, swelling of the face, lips, or tongue  signs and symptoms of bleeding such as bloody or black, tarry stools; red or dark brown urine; spitting up blood or brown material that looks like coffee grounds; red spots on the skin; unusual bruising or bleeding from the eyes, gums, or nose  signs and symptoms of a blood clot such as chest pain; shortness of breath; pain, swelling, or warmth in the leg  signs and symptoms of a stroke like changes in vision; confusion; trouble speaking or understanding; severe headaches; sudden numbness or weakness of the face, arm or leg; trouble walking; dizziness; loss of balance or coordination Side effects that usually do not require medical attention (report to your doctor or health care professional if they continue or are bothersome):  headache  pain in arms and legs  pain in mouth  stomach pain This list may not describe all possible side effects. Call your doctor for medical advice about side effects. You may report side effects to FDA at 1-800-FDA-1088. Where should I keep my medicine? This drug is given in a hospital or clinic   and will not be stored at home. NOTE: This sheet is a summary. It may not cover all possible information. If you have questions about this medicine, talk to your doctor, pharmacist, or health care provider.  2020 Elsevier/Gold Standard (2017-02-16 11:10:55)  

## 2019-09-30 NOTE — Progress Notes (Addendum)
  [place] OFFICE PROGRESS NOTE   Diagnosis: ITP  INTERVAL HISTORY:   [name] returns as scheduled.  Prednisone tapered to 20 mg daily 09/23/2019.  He continues weekly Nplate.  He denies bleeding.  Dyspnea is better.  He reports a voiding trial is scheduled next week to see if he can have the catheter removed.  Objective:  Vital signs in last 24 hours:  Blood pressure (!) 130/73, pulse 62, temperature 97.8 F (36.6 C), temperature source Temporal, resp. rate 18, height '5\' 4"'$  (1.626 m), weight 161 lb 1.6 oz (73.1 kg), SpO2 97 %.    HEENT: No thrush or ulcers. Resp: Lungs clear bilaterally. Cardio: Regular rate and rhythm. GI: No hepatosplenomegaly. Vascular: Trace edema at the lower legs bilaterally. Skin: Lower abdomen/groin with resolving ecchymoses.  Puncture site at the right groin appears healed, no bleeding.  Surrounding hematoma seems smaller. Right upper extremity PICC without erythema.  Lab Results:  Lab Results  Component Value Date   WBC 23.5 (H) 09/30/2019   HGB 11.6 (L) 09/30/2019   HCT 36.1 (L) 09/30/2019   MCV 78.6 (L) 09/30/2019   PLT 210 09/30/2019   NEUTROABS 17.0 (H) 09/30/2019    Imaging:  No results found.  Medications: I have reviewed the patient's current medications.  Assessment/Plan: 1. Thrombocytopenia ? Bone marrow biopsy 07/08/2016-no evidence of B-cell lymphoma, 40% cellular bone marrow with trilineage hematopoiesis, megakaryocytes present with normal morphology, normal cytogenetics, negative for BRAF mutation ? Flow cytometry 01/05/2009-no monoclonal B-cell or phenotypically abnormal T-cell population ? Prednisone starting 06/29/2019 ? Nplate 07/04/2019, 3/38/3291,BTYOMA 07/18/2019 ? Prednisone 20 mg daily beginning 07/25/2019 ? Platelets 11,000 on 08/15/2019, prednisone increased to 40 mg daily, Nplate resume 0/06/5995 ? Prednisone decreased to 30 mg daily 08/25/2019, Nplate continued ? Prednisone continued at 30 mg daily  09/13/2019, weekly Nplate continued ? Prednisone taper to 20 mg daily 09/23/2019, weekly Nplate continued ? Prednisone taper to 10 mg daily 09/30/2019, weekly Nplate continued 2. Hairy cell leukemia 1982, status post splenectomy 3. Coronary artery disease 4. Aortic stenosis 5. 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 Macular degeneration 7. Hearing loss 8. Basal cell carcinoma left cheek 05/24/2019 9. Left upper lobe nodule, faint FDG activity on PET at [facility] 05/31/2013 10. Status post TAVR procedure 09/06/2019 11. Admission with Pseudomonas urosepsis 09/16/2019 12. Acute left MCA distribution CVA 09/16/2019  Disposition: Mr. Stankey appears stable.  The platelet count is higher, now in normal range.  Plan to taper prednisone to 10 mg daily, continue weekly Nplate.  He will return for lab and Nplate in 1 week and 2 weeks.  We will see him in follow-up in 3 weeks.  He will contact the office in the interim with any problems.  Patient seen with Dr. Benay Spice.    [name] ANP/GNP-BC   09/30/2019  12:13 PM  This was a shared visit with [name].  Mr. Mapel was interviewed and examined.  The platelet count has moved into the normal range.  He will continue a prednisone taper and weekly Nplate.  Julieanne Manson, MD

## 2019-10-03 ENCOUNTER — Telehealth: Payer: Self-pay | Admitting: Oncology

## 2019-10-03 DIAGNOSIS — R3914 Feeling of incomplete bladder emptying: Secondary | ICD-10-CM | POA: Diagnosis not present

## 2019-10-03 DIAGNOSIS — Z452 Encounter for adjustment and management of vascular access device: Secondary | ICD-10-CM | POA: Diagnosis not present

## 2019-10-03 DIAGNOSIS — N390 Urinary tract infection, site not specified: Secondary | ICD-10-CM | POA: Diagnosis not present

## 2019-10-03 DIAGNOSIS — I480 Paroxysmal atrial fibrillation: Secondary | ICD-10-CM | POA: Diagnosis not present

## 2019-10-03 DIAGNOSIS — B965 Pseudomonas (aeruginosa) (mallei) (pseudomallei) as the cause of diseases classified elsewhere: Secondary | ICD-10-CM | POA: Diagnosis not present

## 2019-10-03 DIAGNOSIS — I110 Hypertensive heart disease with heart failure: Secondary | ICD-10-CM | POA: Diagnosis not present

## 2019-10-03 DIAGNOSIS — I2510 Atherosclerotic heart disease of native coronary artery without angina pectoris: Secondary | ICD-10-CM | POA: Diagnosis not present

## 2019-10-03 NOTE — Telephone Encounter (Signed)
Called pt to go over appts that were scheduled per 7/30 los. Pt confirmed appt date and time.

## 2019-10-04 DIAGNOSIS — R3914 Feeling of incomplete bladder emptying: Secondary | ICD-10-CM | POA: Diagnosis not present

## 2019-10-05 DIAGNOSIS — B965 Pseudomonas (aeruginosa) (mallei) (pseudomallei) as the cause of diseases classified elsewhere: Secondary | ICD-10-CM | POA: Diagnosis not present

## 2019-10-05 DIAGNOSIS — N390 Urinary tract infection, site not specified: Secondary | ICD-10-CM | POA: Diagnosis not present

## 2019-10-05 DIAGNOSIS — I480 Paroxysmal atrial fibrillation: Secondary | ICD-10-CM | POA: Diagnosis not present

## 2019-10-05 DIAGNOSIS — Z452 Encounter for adjustment and management of vascular access device: Secondary | ICD-10-CM | POA: Diagnosis not present

## 2019-10-05 DIAGNOSIS — I110 Hypertensive heart disease with heart failure: Secondary | ICD-10-CM | POA: Diagnosis not present

## 2019-10-05 DIAGNOSIS — I2510 Atherosclerotic heart disease of native coronary artery without angina pectoris: Secondary | ICD-10-CM | POA: Diagnosis not present

## 2019-10-06 DIAGNOSIS — I480 Paroxysmal atrial fibrillation: Secondary | ICD-10-CM | POA: Diagnosis not present

## 2019-10-06 DIAGNOSIS — N390 Urinary tract infection, site not specified: Secondary | ICD-10-CM | POA: Diagnosis not present

## 2019-10-06 DIAGNOSIS — N189 Chronic kidney disease, unspecified: Secondary | ICD-10-CM | POA: Diagnosis not present

## 2019-10-06 DIAGNOSIS — I2510 Atherosclerotic heart disease of native coronary artery without angina pectoris: Secondary | ICD-10-CM | POA: Diagnosis not present

## 2019-10-06 DIAGNOSIS — Z452 Encounter for adjustment and management of vascular access device: Secondary | ICD-10-CM | POA: Diagnosis not present

## 2019-10-06 DIAGNOSIS — B965 Pseudomonas (aeruginosa) (mallei) (pseudomallei) as the cause of diseases classified elsewhere: Secondary | ICD-10-CM | POA: Diagnosis not present

## 2019-10-06 DIAGNOSIS — I110 Hypertensive heart disease with heart failure: Secondary | ICD-10-CM | POA: Diagnosis not present

## 2019-10-06 DIAGNOSIS — I509 Heart failure, unspecified: Secondary | ICD-10-CM | POA: Diagnosis not present

## 2019-10-07 ENCOUNTER — Inpatient Hospital Stay: Payer: Medicare Other | Attending: Oncology

## 2019-10-07 ENCOUNTER — Other Ambulatory Visit: Payer: Self-pay

## 2019-10-07 ENCOUNTER — Inpatient Hospital Stay: Payer: Medicare Other

## 2019-10-07 VITALS — BP 120/70 | HR 63 | Temp 98.0°F | Resp 18

## 2019-10-07 DIAGNOSIS — D693 Immune thrombocytopenic purpura: Secondary | ICD-10-CM

## 2019-10-07 DIAGNOSIS — D696 Thrombocytopenia, unspecified: Secondary | ICD-10-CM | POA: Insufficient documentation

## 2019-10-07 LAB — CBC WITH DIFFERENTIAL (CANCER CENTER ONLY)
Abs Immature Granulocytes: 2.3 10*3/uL — ABNORMAL HIGH (ref 0.00–0.07)
Basophils Absolute: 0.4 10*3/uL — ABNORMAL HIGH (ref 0.0–0.1)
Basophils Relative: 2 %
Eosinophils Absolute: 0.2 10*3/uL (ref 0.0–0.5)
Eosinophils Relative: 1 %
HCT: 35.3 % — ABNORMAL LOW (ref 39.0–52.0)
Hemoglobin: 11.2 g/dL — ABNORMAL LOW (ref 13.0–17.0)
Immature Granulocytes: 12 %
Lymphocytes Relative: 3 %
Lymphs Abs: 0.6 10*3/uL — ABNORMAL LOW (ref 0.7–4.0)
MCH: 25.5 pg — ABNORMAL LOW (ref 26.0–34.0)
MCHC: 31.7 g/dL (ref 30.0–36.0)
MCV: 80.2 fL (ref 80.0–100.0)
Monocytes Absolute: 3.9 10*3/uL — ABNORMAL HIGH (ref 0.1–1.0)
Monocytes Relative: 21 %
Neutro Abs: 11.3 10*3/uL — ABNORMAL HIGH (ref 1.7–7.7)
Neutrophils Relative %: 61 %
Platelet Count: 126 10*3/uL — ABNORMAL LOW (ref 150–400)
RBC: 4.4 MIL/uL (ref 4.22–5.81)
RDW: 20.7 % — ABNORMAL HIGH (ref 11.5–15.5)
WBC Count: 18.7 10*3/uL — ABNORMAL HIGH (ref 4.0–10.5)
nRBC: 0 % (ref 0.0–0.2)

## 2019-10-07 MED ORDER — ROMIPLOSTIM 125 MCG ~~LOC~~ SOLR
1.0000 ug/kg | Freq: Once | SUBCUTANEOUS | Status: AC
  Administered 2019-10-07: 75 ug via SUBCUTANEOUS
  Filled 2019-10-07: qty 0.15

## 2019-10-10 ENCOUNTER — Encounter: Payer: Self-pay | Admitting: *Deleted

## 2019-10-10 ENCOUNTER — Other Ambulatory Visit: Payer: Self-pay | Admitting: *Deleted

## 2019-10-10 ENCOUNTER — Telehealth: Payer: Self-pay

## 2019-10-10 DIAGNOSIS — I110 Hypertensive heart disease with heart failure: Secondary | ICD-10-CM | POA: Diagnosis not present

## 2019-10-10 DIAGNOSIS — I2510 Atherosclerotic heart disease of native coronary artery without angina pectoris: Secondary | ICD-10-CM | POA: Diagnosis not present

## 2019-10-10 DIAGNOSIS — N390 Urinary tract infection, site not specified: Secondary | ICD-10-CM | POA: Diagnosis not present

## 2019-10-10 DIAGNOSIS — I480 Paroxysmal atrial fibrillation: Secondary | ICD-10-CM | POA: Diagnosis not present

## 2019-10-10 DIAGNOSIS — Z452 Encounter for adjustment and management of vascular access device: Secondary | ICD-10-CM | POA: Diagnosis not present

## 2019-10-10 DIAGNOSIS — B965 Pseudomonas (aeruginosa) (mallei) (pseudomallei) as the cause of diseases classified elsewhere: Secondary | ICD-10-CM | POA: Diagnosis not present

## 2019-10-10 NOTE — Patient Outreach (Signed)
[facility]) Care Management  10/10/2019  HERRICK HARTOG 05-01-1923 [id number]  Telephone Assessment-HTN  Spoke with wife [name]) with an update. Reports pt continue to recovery well with no major issues. Verified pt received the [facility] packet with the most recent BP this morning at 120/60. Denies any precipitating symptoms and pt continue to attend all medical appointments, take all recommended medications and [facility] continues to delivery IV therapy via pt's PICC line. States pt is weaned to 10 mg of prednisone with good tolerance.  Reports weekly labs to replace his platelets with no acute events.   Plan: Plan of care will be discussed. No other resources needed at this time. Will follow up next month with ongoing case management services related to pt's HTN management. . Goals Addressed              This Visit's Progress   .  I wanta get better (pt-stated)   On track     [place] (see longtitudinal plan of care for additional care plan information)  Objective:  . Last practice recorded BP readings:  BP Readings from Last 3 Encounters:  09/21/19 116/73  09/14/19 (!) 142/74  09/13/19 134/70 .   Marland Kitchen Most recent eGFR/CrCl: No results found for: EGFR  No components found for: CRCL  Current Barriers:  Marland Kitchen Knowledge Deficits related to basic understanding of hypertension diagnosis . Knowledge Deficits related to understanding of medications prescribed for management of hypertension . Knowledge deficit related to dangers of uncontrolled hypertension  Case Manager Clinical Goal(s):  Marland Kitchen Over the next 90 days, patient will verbalize understanding of plan for hypertension management . Over the next 30 days, patient will attend all scheduled medical appointments: all appointments were reviewed today with verification of sufficient transportation. . Over the next 30 days, patient will demonstrate improved adherence to prescribed treatment plan for hypertension as  evidenced by taking all medications as prescribed, monitoring and recording blood pressure as directed, adhering to low sodium/DASH diet . Over the next 30 days, patient will verbalize basic understanding of hypertension disease process and self health management plan as evidenced by report normal blood pressures  Interventions:  . Evaluation of current treatment plan related to hypertension self management and patient's adherence to plan as established by provider. . Reviewed medications with patient and discussed importance of compliance . Discussed plans with patient for ongoing care management follow up and provided patient with direct contact information for care management team . Advised patient, providing education and rationale, to monitor blood pressure daily and record, calling PCP for findings outside established parameters.  . Provided education regarding s/s of stroke and stroke prevention . Provided education regarding s/s of heart attack . Provided education regarding s/s of DASH diet/Low salt diet . Provided education regarding complications of uncontrolled blood pressure . Provided education regarding s/s hypertensive crisis  Patient Self Care Activities:  . Self administers medications as prescribed . Attends all scheduled provider appointments . Calls provider office for new concerns, questions, or BP outside discussed parameters . Monitors BP and records as discussed . Adheres to a low sodium diet/DASH diet  Initial goal documentation         [name], RN Care Management Coordinator [place] Office [phone]

## 2019-10-10 NOTE — Telephone Encounter (Signed)
Received call from patient's wife [name]) stating patient has Macular degeneration and  gest one injection into the left eye every 4-5 weeks (Lucentis). Family wants to know if Dr. Megan Salon is ok with continuing this while being treated for pseudomonas urinary tract infection. Routing to MD for advise. Eugenia Mcalpine

## 2019-10-10 NOTE — Telephone Encounter (Signed)
He has he can continue his injections.

## 2019-10-10 NOTE — Telephone Encounter (Signed)
Noted. Patient made aware. Thanks

## 2019-10-11 DIAGNOSIS — H353221 Exudative age-related macular degeneration, left eye, with active choroidal neovascularization: Secondary | ICD-10-CM | POA: Diagnosis not present

## 2019-10-12 ENCOUNTER — Encounter: Payer: Self-pay | Admitting: Internal Medicine

## 2019-10-12 ENCOUNTER — Telehealth: Payer: Self-pay

## 2019-10-12 ENCOUNTER — Ambulatory Visit (INDEPENDENT_AMBULATORY_CARE_PROVIDER_SITE_OTHER): Payer: Medicare Other | Admitting: Internal Medicine

## 2019-10-12 ENCOUNTER — Other Ambulatory Visit: Payer: Self-pay

## 2019-10-12 DIAGNOSIS — A498 Other bacterial infections of unspecified site: Secondary | ICD-10-CM

## 2019-10-12 DIAGNOSIS — I639 Cerebral infarction, unspecified: Secondary | ICD-10-CM

## 2019-10-12 DIAGNOSIS — I110 Hypertensive heart disease with heart failure: Secondary | ICD-10-CM | POA: Diagnosis not present

## 2019-10-12 DIAGNOSIS — I480 Paroxysmal atrial fibrillation: Secondary | ICD-10-CM | POA: Diagnosis not present

## 2019-10-12 DIAGNOSIS — Z452 Encounter for adjustment and management of vascular access device: Secondary | ICD-10-CM | POA: Diagnosis not present

## 2019-10-12 DIAGNOSIS — I2510 Atherosclerotic heart disease of native coronary artery without angina pectoris: Secondary | ICD-10-CM | POA: Diagnosis not present

## 2019-10-12 DIAGNOSIS — N390 Urinary tract infection, site not specified: Secondary | ICD-10-CM | POA: Diagnosis not present

## 2019-10-12 DIAGNOSIS — N3000 Acute cystitis without hematuria: Secondary | ICD-10-CM

## 2019-10-12 DIAGNOSIS — B965 Pseudomonas (aeruginosa) (mallei) (pseudomallei) as the cause of diseases classified elsewhere: Secondary | ICD-10-CM | POA: Diagnosis not present

## 2019-10-12 NOTE — Progress Notes (Signed)
[name]  Patient Active Problem List   Diagnosis Date Noted  . Pseudomonas aeruginosa infection 09/17/2019    Priority: High  . Urinary tract infection without hematuria     Priority: High  . Paroxysmal atrial fibrillation ([place]) 09/17/2019  . Stroke (cerebrum) ([place]) 09/16/2019  . Acute on chronic diastolic heart failure ([place]) 09/06/2019  . Bifascicular block 09/06/2019  . 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 Severe aortic stenosis   . Chronic ITP (idiopathic thrombocytopenia) (H[facility]) 07/04/2019  . Chronic eczematous otitis externa of both ears 01/15/2016  . Sensorineural hearing loss (SNHL) of both ears 01/15/2016  . Prostate nodule 03/09/2015  . CAD (coronary artery disease)   . GERD (gastroesophageal reflux disease)   . Hypertension   . Dyslipidemia   . Colon cancer ([place])   . Solitary pulmonary nodule 02/11/2013  . Leukemic reticuloendotheliosis, extranodal and solid organ sites [facility]) 01/03/2013  . Glaucoma 11/27/2011  . Personal history of other malignant neoplasm of skin 10/14/2011  . Elevated PSA 09/12/2011  . Liposarcoma, well differentiated type ([place]) 09/12/2011  . Traction detachment of retina 11/18/2010  . Exudative age-related macular degeneration ([place]) 04/16/2010  . Epiretinal membrane 04/16/2010  . Vitreous degeneration 04/16/2010    Patient's Medications  New Prescriptions   No medications on file  Previous Medications   AMOXICILLIN (AMOXIL) 500 MG TABLET    Take 4 capsules (2,000 mg) one hour prior to all dental visits.   ASPIRIN EC 81 MG EC TABLET    Take 1 tablet (81 mg total) by mouth daily. Swallow whole.   ATORVASTATIN (LIPITOR) 20 MG TABLET    TAKE 1 TABLET BY MOUTH DAILY AT 6 PM   CEFEPIME (MAXIPIME) IVPB    Inject 2 g into the vein every 12 (twelve) hours. Indication:  Pseudomonal bacteremia First Dose: No Last Day of Therapy:  10/14/2019 Labs - Once weekly:  CBC/D and BMP, Labs -  Every other week:  ESR and CRP Method of administration: IV Push Method of administration may be changed at the discretion of home infusion pharmacist based upon assessment of the patient and/or caregiver's ability to self-administer the medication ordered.   CHOLECALCIFEROL (VITAMIN D3) 50 MCG (2000 UT) TABS    Take 2,000 Units by mouth every evening.   DESONIDE (DESOWEN) 0.05 % CREAM    Apply 1 application topically 2 (two) times daily as needed (rash/irritation.).    FLUOCINONIDE (LIDEX) 0.05 % EXTERNAL SOLUTION    Apply 1 application topically 2 (two) times daily as needed (apply to ears).    FUROSEMIDE (LASIX) 40 MG TABLET    Take 0.5 tablets (20 mg total) by mouth daily.   LATANOPROST (XALATAN) 0.005 % OPHTHALMIC SOLUTION    Place 1 drop into both eyes at bedtime.    METOPROLOL TARTRATE (LOPRESSOR) 25 MG TABLET    Take 0.5 tablets (12.5 mg total) by mouth daily.   MULTIPLE VITAMINS-MINERALS (PRESERVISION AREDS 2 PO)    Take 1 capsule by mouth 2 (two) times daily.    NITROGLYCERIN (NITROSTAT) 0.4 MG SL TABLET    Place 1 tablet (0.4 mg total) under the tongue every 5 (five) minutes as needed for chest pain. Please make yearly appt with Dr. Tamala Julian for September. 1st attempt   PANTOPRAZOLE (PROTONIX) 40 MG TABLET    TAKE 1 TABLET BY MOUTH DAILY GENERIC EQUIVALENT FOR PROTONIX   POTASSIUM CHLORIDE (KLOR-CON) 10 MEQ TABLET    Take 1  tablet (10 mEq total) by mouth daily.   PREDNISONE (DELTASONE) 10 MG TABLET    TAKE 4 TABLETS(40 MG) BY MOUTH DAILY WITH BREAKFAST   RANIBIZUMAB (LUCENTIS) 0.5 MG/0.05ML SOLN    0.5 mg by Intravitreal route every 28 (twenty-eight) days. Left Eye ONLY   SERTRALINE (ZOLOFT) 50 MG TABLET    Take 50 mg by mouth every evening.    TAMSULOSIN (FLOMAX) 0.4 MG CAPS CAPSULE    Take 0.8 mg by mouth daily after supper.    TIMOLOL (BETIMOL) 0.5 % OPHTHALMIC SOLUTION    Place 1 drop into the right eye daily.    TRIAMCINOLONE CREAM (KENALOG) 0.1 %    Apply 1 application topically 2  (two) times daily as needed (itching).   Modified Medications   No medications on file  Discontinued Medications   No medications on file    Subjective: [name] is in for his hospital follow-up visit.  He was recently hospitalized with Pseudomonas bacteremia from a urinary tract infection.  He was discharged on IV cefepime.  He previously undergone TAVR.  He had no evidence of endocarditis by exam or TTE.  He has chronic ITP and thrombocytopenia so we chose to avoid a TEE and simply treat him for several weeks.  He has now completed 26 days of therapy.  He has not had any problems tolerating his PICC or cefepime.  His Foley catheter has been removed.  He has urinary frequency and passes his urine about every 2 hours.  He has had some mild intermittent dysuria.  He has not had any fever nausea, vomiting or diarrhea.  He says that he has been slightly more forgetful since his acute illness began.  He sometimes mixes up names.  Review of Systems: Review of Systems  Constitutional: Negative for fever.  Gastrointestinal: Negative for abdominal pain, diarrhea, nausea and vomiting.  Genitourinary: Positive for dysuria, frequency and urgency. Negative for hematuria.    Past Medical History:  Diagnosis Date  . Acute appendicitis   . Anemia   . Anxiety   . Arthritis    "back" (03/14/2014)  . Blood dyscrasia    hairy cell leukemia  . CAD (coronary artery disease)    a. 03/14/14  s/p overlapping DES x2 to mid-distal RCA.  . 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 Colon cancer ([place]) 1984  . Compression fracture of lumbar spine, non-traumatic ([place])   . DJD (degenerative joint disease) of lumbar spine   . Dyslipidemia   . Dyspnea   . Elevated PSA   . GERD (gastroesophageal reflux disease)   . Glaucoma   . Hairy cell leukemia ([place]) dx'd 1980  . Heart murmur   . History of blood transfusion    "several; related to hairy cell leukemia & tx "  . History of stomach ulcers 1968  .  Hypertension   . Leukemia, hairy cell ([place])   . Malnutrition ([place])   . Osteoarthritis   . Osteoporosis   . Pneumonia   . 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 Angelena Form and Cyndia Bent.   . Severe aortic stenosis    s/p tavr  . Skin cancer of face     Social History   Tobacco Use  . Smoking status: Former Smoker    Packs/day: 0.25    Years: 1.00    Pack years: 0.25    Types: Cigarettes, Cigars  .  Smokeless tobacco: Never Used  . Tobacco comment: occasional social smoker during college.  Vaping Use  . Vaping Use: Never used  Substance Use Topics  . Alcohol use: Yes    Alcohol/week: 9.0 standard drinks    Types: 2 Glasses of wine, 2 Shots of liquor, 5 Standard drinks or equivalent per week    Comment: socially  . Drug use: No    Family History  Problem Relation Age of Onset  . Congestive Heart Failure Father 38  . Stroke Mother   . Diabetes Mellitus II Brother   . Prostate cancer Brother   . Heart Problems Brother        CABG    Allergies  Allergen Reactions  . Antazoline Anaphylaxis  . Antihistamines, Chlorpheniramine-Type     Nervous, jittery  . Sulfa Antibiotics Rash    Objective: Vitals:   10/12/19 1024  BP: 116/70  Pulse: 80  Temp: (!) 97.3 F (36.3 C)  TempSrc: Oral  Weight: 162 lb (73.5 kg)   Body mass index is 27.81 kg/m.  Physical Exam Constitutional:      Comments: He is in good spirits as usual.  He is with his longtime assistant, [name].  Cardiovascular:     Rate and Rhythm: Normal rate and regular rhythm.     Heart sounds: No murmur heard.   Pulmonary:     Effort: Pulmonary effort is normal.     Breath sounds: Normal breath sounds.  Abdominal:     Palpations: Abdomen is soft.     Tenderness: There is no abdominal tenderness.  Skin:    Comments: His right arm PICC site looks good.  Psychiatric:        Mood and Affect: Mood normal.     Lab  Results    Problem List Items Addressed This Visit      High   Urinary tract infection without hematuria    I will order a follow-up UA and urine culture today.      Relevant Orders   Urinalysis, Routine w reflex microscopic   Urine Culture   Pseudomonas aeruginosa infection    I am hopeful that his Pseudomonas bacteremia and UTI have been cured.  I will stop his cefepime today and have his PICC removed.  I will schedule a follow-up E visit in 4 weeks.          Michel Bickers, MD [facility] [facility] [phone] pager   [phone] cell 10/12/2019, 10:50 AM

## 2019-10-12 NOTE — Telephone Encounter (Signed)
Patient made aware of low  potassium level and  to increase potassium to 20Meq daily for the next three days. Patient verbalized understanding and appreciative of call.

## 2019-10-12 NOTE — Telephone Encounter (Signed)
Please have Mr. Junkins double up his potassium and take 20 mEq daily for the next 3 days.  Thanks.

## 2019-10-12 NOTE — Progress Notes (Signed)
Per verbal order from Dr Megan Salon, 39 cm Single Lumen Peripherally Inserted Central Catheter removed from right basilic, tip intact. No sutures present. RN confirmed length per chart. Dressing was clean and dry. Petroleum dressing applied. Pt advised no heavy lifting with this arm, leave dressing for 24 hours and call the office or seek emergent care if dressing becomes soaked with blood or swelling or sharp pain presents. Patient verbalized understanding and agreement.  Patient's questions answered to their satisfaction. Patient tolerated procedure well, RN walked patient to check out. Landis Gandy, RN

## 2019-10-12 NOTE — Telephone Encounter (Signed)
Received call in triage from [facility] with low potassium level on 3.1; previous potassium of 3.8 on 10/06/19. Patient was seen in the office today. Routing to MD to make aware.  Eugenia Mcalpine

## 2019-10-12 NOTE — Telephone Encounter (Signed)
Per Dr. Megan Salon called [facility] with orders to d/c cefepime and picc will be pulled in office. Spoke with Stanton Kidney who was take order. Will update nursing and pharmacy. [place]

## 2019-10-12 NOTE — Assessment & Plan Note (Signed)
I will order a follow-up UA and urine culture today.

## 2019-10-12 NOTE — Assessment & Plan Note (Signed)
I am hopeful that his Pseudomonas bacteremia and UTI have been cured.  I will stop his cefepime today and have his PICC removed.  I will schedule a follow-up E visit in 4 weeks.

## 2019-10-14 ENCOUNTER — Inpatient Hospital Stay: Payer: Medicare Other

## 2019-10-14 ENCOUNTER — Other Ambulatory Visit: Payer: Self-pay

## 2019-10-14 VITALS — BP 149/70 | HR 62 | Temp 98.2°F | Resp 18

## 2019-10-14 DIAGNOSIS — I2510 Atherosclerotic heart disease of native coronary artery without angina pectoris: Secondary | ICD-10-CM | POA: Diagnosis not present

## 2019-10-14 DIAGNOSIS — D693 Immune thrombocytopenic purpura: Secondary | ICD-10-CM

## 2019-10-14 DIAGNOSIS — I480 Paroxysmal atrial fibrillation: Secondary | ICD-10-CM | POA: Diagnosis not present

## 2019-10-14 DIAGNOSIS — D696 Thrombocytopenia, unspecified: Secondary | ICD-10-CM | POA: Diagnosis not present

## 2019-10-14 DIAGNOSIS — N390 Urinary tract infection, site not specified: Secondary | ICD-10-CM | POA: Diagnosis not present

## 2019-10-14 DIAGNOSIS — B965 Pseudomonas (aeruginosa) (mallei) (pseudomallei) as the cause of diseases classified elsewhere: Secondary | ICD-10-CM | POA: Diagnosis not present

## 2019-10-14 DIAGNOSIS — I110 Hypertensive heart disease with heart failure: Secondary | ICD-10-CM | POA: Diagnosis not present

## 2019-10-14 DIAGNOSIS — Z452 Encounter for adjustment and management of vascular access device: Secondary | ICD-10-CM | POA: Diagnosis not present

## 2019-10-14 LAB — URINE CULTURE
MICRO NUMBER:: 10817723
Result:: NO GROWTH
SPECIMEN QUALITY:: ADEQUATE

## 2019-10-14 LAB — URINALYSIS, ROUTINE W REFLEX MICROSCOPIC
Bacteria, UA: NONE SEEN /HPF
Bilirubin Urine: NEGATIVE
Glucose, UA: NEGATIVE
Leukocytes,Ua: NEGATIVE
Nitrite: NEGATIVE
Specific Gravity, Urine: 1.021 (ref 1.001–1.03)
Squamous Epithelial / HPF: NONE SEEN /HPF (ref <=5)
pH: 5.5 (ref 5.0–8.0)

## 2019-10-14 LAB — CBC WITH DIFFERENTIAL (CANCER CENTER ONLY)
Abs Immature Granulocytes: 2.71 10*3/uL — ABNORMAL HIGH (ref 0.00–0.07)
Basophils Absolute: 0.3 10*3/uL — ABNORMAL HIGH (ref 0.0–0.1)
Basophils Relative: 2 %
Eosinophils Absolute: 0.3 10*3/uL (ref 0.0–0.5)
Eosinophils Relative: 1 %
HCT: 36.3 % — ABNORMAL LOW (ref 39.0–52.0)
Hemoglobin: 11.6 g/dL — ABNORMAL LOW (ref 13.0–17.0)
Immature Granulocytes: 14 %
Lymphocytes Relative: 4 %
Lymphs Abs: 0.9 10*3/uL (ref 0.7–4.0)
MCH: 26.2 pg (ref 26.0–34.0)
MCHC: 32 g/dL (ref 30.0–36.0)
MCV: 82.1 fL (ref 80.0–100.0)
Monocytes Absolute: 3.4 10*3/uL — ABNORMAL HIGH (ref 0.1–1.0)
Monocytes Relative: 17 %
Neutro Abs: 12.3 10*3/uL — ABNORMAL HIGH (ref 1.7–7.7)
Neutrophils Relative %: 62 %
Platelet Count: 179 10*3/uL (ref 150–400)
RBC: 4.42 MIL/uL (ref 4.22–5.81)
RDW: 20.9 % — ABNORMAL HIGH (ref 11.5–15.5)
WBC Count: 19.8 10*3/uL — ABNORMAL HIGH (ref 4.0–10.5)
nRBC: 0 % (ref 0.0–0.2)

## 2019-10-14 MED ORDER — ROMIPLOSTIM 125 MCG ~~LOC~~ SOLR
1.0000 ug/kg | Freq: Once | SUBCUTANEOUS | Status: AC
  Administered 2019-10-14: 75 ug via SUBCUTANEOUS
  Filled 2019-10-14: qty 0.15

## 2019-10-14 NOTE — Patient Instructions (Signed)
Romiplostim injection What is this medicine? ROMIPLOSTIM (roe mi PLOE stim) helps your body make more platelets. This medicine is used to treat low platelets caused by chronic idiopathic thrombocytopenic purpura (ITP). This medicine may be used for other purposes; ask your health care provider or pharmacist if you have questions. COMMON BRAND NAME(S): Nplate What should I tell my health care provider before I take this medicine? They need to know if you have any of these conditions:  bleeding disorders  bone marrow problem, like blood cancer or myelodysplastic syndrome  history of blood clots  liver disease  surgery to remove your spleen  an unusual or allergic reaction to romiplostim, mannitol, other medicines, foods, dyes, or preservatives  pregnant or trying to get pregnant  breast-feeding How should I use this medicine? This medicine is for injection under the skin. It is given by a health care professional in a hospital or clinic setting. A special MedGuide will be given to you before your injection. Read this information carefully each time. Talk to your pediatrician regarding the use of this medicine in children. While this drug may be prescribed for children as young as 1 year for selected conditions, precautions do apply. Overdosage: If you think you have taken too much of this medicine contact a poison control center or emergency room at once. NOTE: This medicine is only for you. Do not share this medicine with others. What if I miss a dose? It is important not to miss your dose. Call your doctor or health care professional if you are unable to keep an appointment. What may interact with this medicine? Interactions are not expected. This list may not describe all possible interactions. Give your health care provider a list of all the medicines, herbs, non-prescription drugs, or dietary supplements you use. Also tell them if you smoke, drink alcohol, or use illegal drugs.  Some items may interact with your medicine. What should I watch for while using this medicine? Your condition will be monitored carefully while you are receiving this medicine. Visit your prescriber or health care professional for regular checks on your progress and for the needed blood tests. It is important to keep all appointments. What side effects may I notice from receiving this medicine? Side effects that you should report to your doctor or health care professional as soon as possible:  allergic reactions like skin rash, itching or hives, swelling of the face, lips, or tongue  signs and symptoms of bleeding such as bloody or black, tarry stools; red or dark brown urine; spitting up blood or brown material that looks like coffee grounds; red spots on the skin; unusual bruising or bleeding from the eyes, gums, or nose  signs and symptoms of a blood clot such as chest pain; shortness of breath; pain, swelling, or warmth in the leg  signs and symptoms of a stroke like changes in vision; confusion; trouble speaking or understanding; severe headaches; sudden numbness or weakness of the face, arm or leg; trouble walking; dizziness; loss of balance or coordination Side effects that usually do not require medical attention (report to your doctor or health care professional if they continue or are bothersome):  headache  pain in arms and legs  pain in mouth  stomach pain This list may not describe all possible side effects. Call your doctor for medical advice about side effects. You may report side effects to FDA at 1-800-FDA-1088. Where should I keep my medicine? This drug is given in a hospital or clinic   and will not be stored at home. NOTE: This sheet is a summary. It may not cover all possible information. If you have questions about this medicine, talk to your doctor, pharmacist, or health care provider.  2020 Elsevier/Gold Standard (2017-02-16 11:10:55)  

## 2019-10-18 ENCOUNTER — Telehealth: Payer: Self-pay

## 2019-10-18 ENCOUNTER — Ambulatory Visit: Payer: Medicare Other | Admitting: Internal Medicine

## 2019-10-18 NOTE — Telephone Encounter (Signed)
Due to provider illness, the patient will keep echo appointment tomorrow and change office visit to virtual visit on 8/19. He will have vital signs ready when called.    Patient Consent for Virtual Visit   CONSENT FOR VIRTUAL VISIT FOR:  [name]  By participating in this virtual visit I agree to the following:  I hereby voluntarily request, consent and authorize [facility] and its employed or contracted physicians, physician assistants, nurse practitioners or other licensed health care professionals (the Practitioner), to provide me with telemedicine health care services (the Services") as deemed necessary by the treating Practitioner. I acknowledge and consent to receive the Services by the Practitioner via telemedicine. I understand that the telemedicine visit will involve communicating with the Practitioner through live audiovisual communication technology and the disclosure of certain medical information by electronic transmission. I acknowledge that I have been given the opportunity to request an in-person assessment or other available alternative prior to the telemedicine visit and am voluntarily participating in the telemedicine visit.  I understand that I have the right to withhold or withdraw my consent to the use of telemedicine in the course of my care at any time, without affecting my right to future care or treatment, and that the Practitioner or I may terminate the telemedicine visit at any time. I understand that I have the right to inspect all information obtained and/or recorded in the course of the telemedicine visit and may receive copies of available information for a reasonable fee.  I understand that some of the potential risks of receiving the Services via telemedicine include:   Delay or interruption in medical evaluation due to technological equipment failure or disruption;  Information transmitted may not be sufficient (e.g. poor resolution of images) to allow for  appropriate medical decision making by the Practitioner; and/or   In rare instances, security protocols could fail, causing a breach of personal health information.  Furthermore, I acknowledge that it is my responsibility to provide information about my medical history, conditions and care that is complete and accurate to the best of my ability. I acknowledge that Practitioner's advice, recommendations, and/or decision may be based on factors not within their control, such as incomplete or inaccurate data provided by me or distortions of diagnostic images or specimens that may result from electronic transmissions. I understand that the practice of medicine is not an exact science and that Practitioner makes no warranties or guarantees regarding treatment outcomes. I acknowledge that a copy of this consent can be made available to me via my patient portal ([place]), or I can request a printed copy by calling the office of [facility].    I understand that my insurance will be billed for this visit.   I have read or had this consent read to me.  I understand the contents of this consent, which adequately explains the benefits and risks of the Services being provided via telemedicine.   I have been provided ample opportunity to ask questions regarding this consent and the Services and have had my questions answered to my satisfaction.  I give my informed consent for the services to be provided through the use of telemedicine in my medical care

## 2019-10-19 ENCOUNTER — Ambulatory Visit (HOSPITAL_COMMUNITY): Payer: Medicare Other | Attending: Cardiovascular Disease

## 2019-10-19 ENCOUNTER — Other Ambulatory Visit: Payer: Self-pay

## 2019-10-19 ENCOUNTER — Ambulatory Visit: Payer: Medicare Other | Admitting: Physician Assistant

## 2019-10-19 DIAGNOSIS — Z952 Presence of prosthetic heart valve: Secondary | ICD-10-CM | POA: Diagnosis not present

## 2019-10-19 LAB — ECHOCARDIOGRAM COMPLETE
AR max vel: 1.26 cm2
AV Area VTI: 1.01 cm2
AV Area mean vel: 1.03 cm2
AV Mean grad: 23 mmHg
AV Peak grad: 42.8 mmHg
Ao pk vel: 3.27 m/s
Area-P 1/2: 2.75 cm2
S' Lateral: 2.3 cm

## 2019-10-20 ENCOUNTER — Other Ambulatory Visit: Payer: Self-pay | Admitting: Physician Assistant

## 2019-10-20 ENCOUNTER — Other Ambulatory Visit: Payer: Self-pay | Admitting: *Deleted

## 2019-10-20 ENCOUNTER — Telehealth (INDEPENDENT_AMBULATORY_CARE_PROVIDER_SITE_OTHER): Payer: Medicare Other | Admitting: Physician Assistant

## 2019-10-20 ENCOUNTER — Encounter: Payer: Self-pay | Admitting: Oncology

## 2019-10-20 VITALS — BP 146/75 | HR 55 | Ht 64.0 in | Wt 161.0 lb

## 2019-10-20 DIAGNOSIS — D696 Thrombocytopenia, unspecified: Secondary | ICD-10-CM | POA: Diagnosis not present

## 2019-10-20 DIAGNOSIS — I10 Essential (primary) hypertension: Secondary | ICD-10-CM

## 2019-10-20 DIAGNOSIS — Z952 Presence of prosthetic heart valve: Secondary | ICD-10-CM

## 2019-10-20 DIAGNOSIS — I459 Conduction disorder, unspecified: Secondary | ICD-10-CM

## 2019-10-20 DIAGNOSIS — D693 Immune thrombocytopenic purpura: Secondary | ICD-10-CM

## 2019-10-20 DIAGNOSIS — I639 Cerebral infarction, unspecified: Secondary | ICD-10-CM | POA: Diagnosis not present

## 2019-10-20 DIAGNOSIS — I480 Paroxysmal atrial fibrillation: Secondary | ICD-10-CM

## 2019-10-20 DIAGNOSIS — I5032 Chronic diastolic (congestive) heart failure: Secondary | ICD-10-CM

## 2019-10-20 NOTE — Progress Notes (Signed)
[facility]   MULTIDISCIPLINARY HEART VALVE TEAM  Virtual Visit via Telephone Note   This visit type was conducted due to national recommendations for restrictions regarding the COVID-19 Pandemic (e.g. social distancing) in an effort to limit this patient's exposure and mitigate transmission in our community.  Due to his co-morbid illnesses, this patient is at least at moderate risk for complications without adequate follow up.  This format is felt to be most appropriate for this patient at this time.  The patient did not have access to video technology/had technical difficulties with video requiring transitioning to audio format only (telephone).  All issues noted in this document were discussed and addressed.  No physical exam could be performed with this format.  Please refer to the patient's chart for his  consent to telehealth for [facility].   Evaluation Performed:  Follow-up visit  Date:  10/20/2019   ID:  HOLDAN [name], DOB May 15, 1923, MRN [id number]  Patient Location: Home Provider Location: Office/Clinic  PCP:  [name], MD  Cardiologist:  Sinclair Grooms, MD /Dr. Angelena Form & Dr. Cyndia Bent (TAVR)  Chief Complaint:  1 month s/p TAVR  History of Present Illness:    [name] is a 86 y.o. male with a history of idiopathic thrombocytopenia (Rx'd with Nplate and steroids),bifasicular block (RBBB, LAFB), anemia, anxiety, CAD, colon cancer,HLD, GERD, hairy cell leukemia, HTN and severe aortic stenosiss/p TAVR (09/06/19) followed by a readmission for urosepsis who presents via virtual medicine for follow up.   He has been followed by Dr. Tamala Julian with what is felt to be a moderate aortic stenosis. An echocardiogram in October 2020 showed a mean gradient of 32 mmHg with a peak gradient of 55 mmHg and a valve area of 0.85 cm. Left ventricular ejection fraction was 60 to 65%. He now presents with progressive exertional fatigue and shortness of breath which he  says has been going on for several years but recently getting worse to the point where he gets fatigue and shortness of breath with going up stairs or an incline. He has had generalized weakness in his legs with any exercise.Repeat echocardiogram on 06/13/2019 showed further progression of his aortic stenosis with a mean gradient of 43 mmHg and a peak gradient of 67 mmHg. Aortic valve area by VTI was 0.76 cm with a dimensionless index of 0.24 consistent with severe aortic stenosis. Left ventricular ejection fraction remained 60 to 65% with grade 1 diastolic dysfunction.L/RHC on 07/26/19 showedchronically occluded mid LAD, moderate disease in the diagonals and mid RCA. Medical therapy recommended for CAD.  He was evaluated by the multidisciplinary valve team and underwent successful TAVR with a50mm Edwards Sapien 3 UltraTHV via the TF approach on7/6/21. Post operative echoshowed EF 65-70%, normally functioningTAVRwith a mean gradient of 11 mm Hg and no PVL. He was discharged home on Asprin alone given historyof thrombocytopenia. Given underlying conduction disease and TAVR a Zio patch was placed at discharge. Additionally, he had continued issues with urinary retention and was discharged with a foley catheter in place.   We were alerted by Zio on 7/12 of new onset afib. Plan is to assess afib burden before discussing anticoagulation given ongoing thrombocytopenia.   He was later 7/16-7/21/21 for urosepsis/bacteremia and possible CVA. Echo during this admission showed EF 65% with increased mean gradient to 25 mm Hg. TEE was recommended. He was discharged home on IV abx and TEE was deferred.  Today he presents for follow up. Wife conferenced in. Doing  well. Picc line removed last week. Has good and bad days in terms of his energy. No chest pain or shortness of breath. No LE edema except in left leg where he has pins and screws. Sometimes has issues with memory. Has pins and needles in feet. The  patient does not have symptoms concerning for COVID-19 infection (fever, chills, cough, or new shortness of breath).    Past Medical History:  Diagnosis Date  . Acute appendicitis   . Anemia   . Anxiety   . Arthritis    "back" (03/14/2014)  . Blood dyscrasia    hairy cell leukemia  . CAD (coronary artery disease)    a. 03/14/14  s/p overlapping DES x2 to mid-distal RCA.  . 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 Colon cancer ([place]) 1984  . Compression fracture of lumbar spine, non-traumatic ([place])   . DJD (degenerative joint disease) of lumbar spine   . Dyslipidemia   . Dyspnea   . Elevated PSA   . GERD (gastroesophageal reflux disease)   . Glaucoma   . Hairy cell leukemia ([place]) dx'd 1980  . Heart murmur   . History of blood transfusion    "several; related to hairy cell leukemia & tx "  . History of stomach ulcers 1968  . Hypertension   . Leukemia, hairy cell ([place])   . Malnutrition ([place])   . Osteoarthritis   . Osteoporosis   . Pneumonia   . 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 Angelena Form and Cyndia Bent.   . Severe aortic stenosis    s/p tavr  . Skin cancer of face    Past Surgical History:  Procedure Laterality Date  . APPENDECTOMY  10/2007  . CATARACT EXTRACTION, BILATERAL Bilateral 02/2008  . COLON SURGERY  1984   "sigmoid; open"  . CORONARY ANGIOPLASTY WITH STENT PLACEMENT  03/14/2014   "2"  . EYE SURGERY Bilateral    cataract  . FRACTURE SURGERY    . LEFT HEART CATHETERIZATION WITH CORONARY ANGIOGRAM N/A 03/14/2014   Procedure: LEFT HEART CATHETERIZATION WITH CORONARY ANGIOGRAM;  Surgeon: Peter M Martinique, MD;  Location: [facility] CATH LAB;  Service: Cardiovascular;  Laterality: N/A;  . LIPOMA EXCISION Right 07/2011   liposarcoma resection; "back"  . MOHS SURGERY Left 02/2011  . MOHS SURGERY  X 3   "all on my face"  . MOLE REMOVAL Left 1985   cheek  . ORIF ANKLE FRACTURE  Left 2000  . RIGHT/LEFT HEART CATH AND CORONARY ANGIOGRAPHY N/A 07/26/2019   Procedure: RIGHT/LEFT HEART CATH AND CORONARY ANGIOGRAPHY;  Surgeon: Belva Crome, MD;  Location: [place] CV LAB;  Service: Cardiovascular;  Laterality: N/A;  . SHOULDER SURGERY  04/2004  . SPLENECTOMY  1992  . TEE WITHOUT CARDIOVERSION N/A 09/06/2019   Procedure: TRANSESOPHAGEAL ECHOCARDIOGRAM (TEE);  Surgeon: Burnell Blanks, MD;  Location: [place] CV LAB;  Service: Open Heart Surgery;  Laterality: N/A;  . TONSILLECTOMY AND ADENOIDECTOMY  1939  . TRANSCATHETER AORTIC VALVE REPLACEMENT, TRANSFEMORAL N/A 09/06/2019   Procedure: TRANSCATHETER AORTIC VALVE REPLACEMENT, TRANSFEMORAL;  Surgeon: Burnell Blanks, MD;  Location: [place] CV LAB;  Service: Open Heart Surgery;  Laterality: N/A;     Current Meds  Medication Sig  . amoxicillin (AMOXIL) 500 MG tablet Take 4 capsules (2,000 mg) one hour prior to all dental visits.  Marland Kitchen aspirin EC 81 MG EC tablet Take 1  tablet (81 mg total) by mouth daily. Swallow whole.  Marland Kitchen atorvastatin (LIPITOR) 20 MG tablet TAKE 1 TABLET BY MOUTH DAILY AT 6 PM (Patient taking differently: Take 20 mg by mouth daily at 6 PM. )  . Cholecalciferol (VITAMIN D3) 50 MCG (2000 UT) TABS Take 2,000 Units by mouth every evening.  Marland Kitchen desonide (DESOWEN) 0.05 % cream Apply 1 application topically 2 (two) times daily as needed (rash/irritation.).   Marland Kitchen fluocinonide (LIDEX) 0.05 % external solution Apply 1 application topically 2 (two) times daily as needed (apply to ears).   . furosemide (LASIX) 40 MG tablet Take 0.5 tablets (20 mg total) by mouth daily.  Marland Kitchen latanoprost (XALATAN) 0.005 % ophthalmic solution Place 1 drop into both eyes at bedtime.   . metoprolol tartrate (LOPRESSOR) 25 MG tablet Take 0.5 tablets (12.5 mg total) by mouth daily.  . Multiple Vitamins-Minerals (PRESERVISION AREDS 2 PO) Take 1 capsule by mouth 2 (two) times daily.   . nitroGLYCERIN (NITROSTAT) 0.4 MG SL tablet Place  1 tablet (0.4 mg total) under the tongue every 5 (five) minutes as needed for chest pain. Please make yearly appt with Dr. Tamala Julian for September. 1st attempt  . pantoprazole (PROTONIX) 40 MG tablet TAKE 1 TABLET BY MOUTH DAILY GENERIC EQUIVALENT FOR PROTONIX (Patient taking differently: Take 40 mg by mouth daily. )  . potassium chloride (KLOR-CON) 10 MEQ tablet Take 1 tablet (10 mEq total) by mouth daily.  . predniSONE (DELTASONE) 10 MG tablet TAKE 4 TABLETS(40 MG) BY MOUTH DAILY WITH BREAKFAST (Patient taking differently: Take 10 mg by mouth daily with breakfast. )  . ranibizumab (LUCENTIS) 0.5 MG/0.05ML SOLN 0.5 mg by Intravitreal route every 28 (twenty-eight) days. Left Eye ONLY  . sertraline (ZOLOFT) 50 MG tablet Take 50 mg by mouth every evening.   . tamsulosin (FLOMAX) 0.4 MG CAPS capsule Take 0.8 mg by mouth daily after supper.   . timolol (BETIMOL) 0.5 % ophthalmic solution Place 1 drop into the right eye daily.   Marland Kitchen triamcinolone cream (KENALOG) 0.1 % Apply 1 application topically 2 (two) times daily as needed (itching).      Allergies:   Antazoline; Antihistamines, chlorpheniramine-type; and Sulfa antibiotics   Social History   Tobacco Use  . Smoking status: Former Smoker    Packs/day: 0.25    Years: 1.00    Pack years: 0.25    Types: Cigarettes, Cigars  . Smokeless tobacco: Never Used  . Tobacco comment: occasional social smoker during college.  Vaping Use  . Vaping Use: Never used  Substance Use Topics  . Alcohol use: Yes    Alcohol/week: 9.0 standard drinks    Types: 2 Glasses of wine, 2 Shots of liquor, 5 Standard drinks or equivalent per week    Comment: socially  . Drug use: No     Family Hx: The patient's family history includes Congestive Heart Failure (age of onset: 45) in his father; Diabetes Mellitus II in his brother; Heart Problems in his brother; Prostate cancer in his brother; Stroke in his mother.  ROS:   Please see the history of present illness.    All  other systems reviewed and are negative.   Prior CV studies:   The following studies were reviewed today:  TAVR OPERATIVE NOTE   Date of Procedure:09/06/2019  Preoperative Diagnosis:Severe Aortic Stenosis   Postoperative Diagnosis:Same   Procedure:   Transcatheter Aortic Valve Replacement - PercutaneousRightTransfemoral Approach Edwards Sapien 3 Ultra THV (size 99mm, model # [id number], serial # [id number])  Co-Surgeons:Bryan  Alveria Apley, MD / Lauree Chandler, MD   Anesthesiologist:R. Ola Spurr, MD  Anne Hahn, MD  Pre-operative Echo Findings: ? Severe aortic stenosis ? Normalleft ventricular systolic function  Post-operative Echo Findings: ? Noparavalvular leak ? Normalleft ventricular systolic function  ____________________   Echo7/7/21: IMPRESSIONS  1. Day 1 post TAVR with normal transaortic gradients with peak/mean 18/11 mmHg. Trivial paravalvular leak.  2. Left ventricular ejection fraction, by estimation, is 65 to 70%. The  left ventricle has normal function. The left ventricle has no regional  wall motion abnormalities. Left ventricular diastolic function could not  be evaluated.  3. Right ventricular systolic function is normal. The right ventricular  size is normal. There is normal pulmonary artery systolic pressure.  4. The mitral valve is normal in structure. Trivial mitral valve  regurgitation. No evidence of mitral stenosis.  5. The aortic valve has been repaired/replaced. Aortic valve  regurgitation is not visualized. No aortic stenosis is present. There is a  26 mm Ultra, stented (TAVR) valve present in the aortic position.  Procedure Date: 09/06/2019. Echo findings are  consistent with normal structure and function of the aortic valve  prosthesis. Aortic valve mean gradient measures 11.0 mmHg.    6. The inferior vena cava is normal in size with greater than 50%  respiratory variability, suggesting right atrial pressure of 3 mmHg.    ____________________  Echo 09/17/19 IMPRESSIONS  1. Left ventricular ejection fraction, by estimation, is 60 to 65%. The  left ventricle has normal function. The left ventricle has no regional  wall motion abnormalities. Left ventricular diastolic parameters are  consistent with Grade I diastolic  dysfunction (impaired relaxation). Elevated left atrial pressure.  2. Right ventricular systolic function is normal. The right ventricular  size is normal. There is normal pulmonary artery systolic pressure.  3. The mitral valve is normal in structure. Trivial mitral valve  regurgitation. No evidence of mitral stenosis.  4. Edwards Sapien 3 THV size 26 mm, model #[id number] is in the AV  position. Mildly elevated mean gradient of 25 mmHg based on published  normals, has signifcantly increased from prior study 09/07/19. The LVOT/ AV  VTI ratio has also decreased. Limited  morphological visualization of prosthetic valve in general with TTE, no  gross visualized thrombus or vegetation. Given presentation with  bacteremia, CVA, and increased gradient across prosthetic valve would  recommend TEE for better visualization. . The  aortic valve has been repaired/replaced. Aortic valve regurgitation is not  visualized. There is a 26 mm Edwards Sapien prosthetic (TAVR) valve  present in the aortic position. Procedure Date: 09/06/2019.  5. The inferior vena cava is normal in size with greater than 50%  respiratory variability, suggesting right atrial pressure of 3 mmHg.  ___________________   Echo 10/19/19 IMPRESSIONS  1. Left ventricular ejection fraction, by estimation, is 60 to 65%. The  left ventricle has normal function. The left ventricle has no regional  wall motion abnormalities. There is mild concentric left ventricular  hypertrophy. Left  ventricular diastolic  parameters are consistent with Grade I diastolic dysfunction (impaired  relaxation). Elevated left ventricular end-diastolic pressure.  2. Right ventricular systolic function is normal. The right ventricular  size is normal. There is normal pulmonary artery systolic pressure.  3. The mitral valve is normal in structure. Trivial mitral valve  regurgitation. No evidence of mitral stenosis.  4. Mean gradient 23 mmHg, unchanged from 25 mmHg on 09/2019. The aortic  valve is normal in structure. Aortic valve regurgitation is not  visualized. Moderate aortic valve stenosis. There is a 26 mm Edwards  Sapien prosthetic (TAVR) valve present in the  aortic position. Procedure Date: 09/06/19. Aortic valve area, by VTI  measures 1.01 cm. Aortic valve mean gradient measures 23.0 mmHg. Aortic  valve Vmax measures 3.27 m/s.  5. Aortic dilatation noted. There is mild dilatation of the ascending  aorta.  6. The inferior vena cava is normal in size with greater than 50%  respiratory variability, suggesting right atrial pressure of 3 mmHg.   Labs/Other Tests and Data Reviewed:    EKG:  No ECG reviewed.  Recent Labs: 09/02/2019: B Natriuretic Peptide 296.9 09/18/2019: ALT 21 09/21/2019: Magnesium 1.9 09/23/2019: BUN 30; Creatinine 1.20; Potassium 4.3; Sodium 135 10/14/2019: Hemoglobin 11.6; Platelet Count 179   Recent Lipid Panel Lab Results  Component Value Date/Time   CHOL 96 09/17/2019 07:52 AM   TRIG 115 09/17/2019 07:52 AM   HDL 30 (L) 09/17/2019 07:52 AM   CHOLHDL 3.2 09/17/2019 07:52 AM   LDLCALC 43 09/17/2019 07:52 AM    Wt Readings from Last 3 Encounters:  10/20/19 161 lb (73 kg)  10/12/19 162 lb (73.5 kg)  09/30/19 161 lb 1.6 oz (73.1 kg)     Objective:    Vital Signs:  BP (!) 146/75   Pulse (!) 55   Ht 5\' 4"  (1.626 m)   Wt 161 lb (73 kg)   BMI 27.64 kg/m      ASSESSMENT & PLAN:    Severe AS s/p TAVR: echo 8/18 showed EF 65%, normally functioning  TAVR with a mean gradient of 23 mm hg (previously 25 mm Hg when admitted for bacteremia) and no PVL. Will repeat echo in three months to follow gradients. Doing well with NYHA class II symptoms. Continue on aspirin alone given elderly age and ongoing thrombocytopenia. I will see him back in 1 year for follow up and echo.   HTN: BP mildly elevated today. No changes made.  Conduction disease: Zio patch did not show any HAVB  Thrombocytopenia: Followed closely by Dr. Ammie Dalton.Wants to get off steroids.   Chronic diastolic CHF: no s/s CHF. Continue on same dose of lasix.   PAF: 2% burden on Zio Patch. Given elderly age and thrombocytopenia, oral anticoagulation has been deferred.    COVID-19 Education: The signs and symptoms of COVID-19 were discussed with the patient and how to seek care for testing (follow up with PCP or arrange E-visit).  The importance of social distancing was discussed today.  Time:   Today, I have spent 25 minutes with the patient with telehealth technology discussing the above problems.     Medication Adjustments/Labs and Tests Ordered: Current medicines are reviewed at length with the patient today.  Concerns regarding medicines are outlined above.   Tests Ordered: No orders of the defined types were placed in this encounter.   Medication Changes: No orders of the defined types were placed in this encounter.    Disposition:  Follow up 1 year   Signed, Angelena Form, Hershal Coria  10/20/2019 6:38 PM    [facility]

## 2019-10-21 ENCOUNTER — Inpatient Hospital Stay: Payer: Medicare Other

## 2019-10-21 ENCOUNTER — Inpatient Hospital Stay (HOSPITAL_BASED_OUTPATIENT_CLINIC_OR_DEPARTMENT_OTHER): Payer: Medicare Other | Admitting: Oncology

## 2019-10-21 ENCOUNTER — Other Ambulatory Visit: Payer: Self-pay

## 2019-10-21 VITALS — BP 135/70 | HR 70 | Temp 97.8°F | Resp 16 | Ht 64.0 in | Wt 161.9 lb

## 2019-10-21 DIAGNOSIS — N390 Urinary tract infection, site not specified: Secondary | ICD-10-CM | POA: Diagnosis not present

## 2019-10-21 DIAGNOSIS — I110 Hypertensive heart disease with heart failure: Secondary | ICD-10-CM | POA: Diagnosis not present

## 2019-10-21 DIAGNOSIS — D693 Immune thrombocytopenic purpura: Secondary | ICD-10-CM | POA: Diagnosis not present

## 2019-10-21 DIAGNOSIS — I2510 Atherosclerotic heart disease of native coronary artery without angina pectoris: Secondary | ICD-10-CM | POA: Diagnosis not present

## 2019-10-21 DIAGNOSIS — Z452 Encounter for adjustment and management of vascular access device: Secondary | ICD-10-CM | POA: Diagnosis not present

## 2019-10-21 DIAGNOSIS — B965 Pseudomonas (aeruginosa) (mallei) (pseudomallei) as the cause of diseases classified elsewhere: Secondary | ICD-10-CM | POA: Diagnosis not present

## 2019-10-21 DIAGNOSIS — D696 Thrombocytopenia, unspecified: Secondary | ICD-10-CM | POA: Diagnosis not present

## 2019-10-21 DIAGNOSIS — I480 Paroxysmal atrial fibrillation: Secondary | ICD-10-CM | POA: Diagnosis not present

## 2019-10-21 DIAGNOSIS — I639 Cerebral infarction, unspecified: Secondary | ICD-10-CM

## 2019-10-21 LAB — CBC WITH DIFFERENTIAL (CANCER CENTER ONLY)
Abs Immature Granulocytes: 2.28 10*3/uL — ABNORMAL HIGH (ref 0.00–0.07)
Basophils Absolute: 0.2 10*3/uL — ABNORMAL HIGH (ref 0.0–0.1)
Basophils Relative: 1 %
Eosinophils Absolute: 0.3 10*3/uL (ref 0.0–0.5)
Eosinophils Relative: 2 %
HCT: 35.7 % — ABNORMAL LOW (ref 39.0–52.0)
Hemoglobin: 11.5 g/dL — ABNORMAL LOW (ref 13.0–17.0)
Immature Granulocytes: 13 %
Lymphocytes Relative: 6 %
Lymphs Abs: 1 10*3/uL (ref 0.7–4.0)
MCH: 25.8 pg — ABNORMAL LOW (ref 26.0–34.0)
MCHC: 32.2 g/dL (ref 30.0–36.0)
MCV: 80.2 fL (ref 80.0–100.0)
Monocytes Absolute: 3.7 10*3/uL — ABNORMAL HIGH (ref 0.1–1.0)
Monocytes Relative: 22 %
Neutro Abs: 9.6 10*3/uL — ABNORMAL HIGH (ref 1.7–7.7)
Neutrophils Relative %: 56 %
Platelet Count: 203 10*3/uL (ref 150–400)
RBC: 4.45 MIL/uL (ref 4.22–5.81)
RDW: 20.2 % — ABNORMAL HIGH (ref 11.5–15.5)
WBC Count: 17.1 10*3/uL — ABNORMAL HIGH (ref 4.0–10.5)
nRBC: 0 % (ref 0.0–0.2)

## 2019-10-21 LAB — BASIC METABOLIC PANEL - CANCER CENTER ONLY
Anion gap: 15 (ref 5–15)
BUN: 35 mg/dL — ABNORMAL HIGH (ref 8–23)
CO2: 22 mmol/L (ref 22–32)
Calcium: 9.3 mg/dL (ref 8.9–10.3)
Chloride: 102 mmol/L (ref 98–111)
Creatinine: 1.42 mg/dL — ABNORMAL HIGH (ref 0.61–1.24)
GFR, Est AFR Am: 49 mL/min — ABNORMAL LOW (ref >60)
GFR, Estimated: 42 mL/min — ABNORMAL LOW (ref >60)
Glucose, Bld: 92 mg/dL (ref 70–99)
Potassium: 3.6 mmol/L (ref 3.5–5.1)
Sodium: 139 mmol/L (ref 135–145)

## 2019-10-21 MED ORDER — ROMIPLOSTIM 125 MCG ~~LOC~~ SOLR
1.0000 ug/kg | Freq: Once | SUBCUTANEOUS | Status: AC
  Administered 2019-10-21: 75 ug via SUBCUTANEOUS
  Filled 2019-10-21: qty 0.15

## 2019-10-21 NOTE — Patient Instructions (Signed)
Romiplostim injection What is this medicine? ROMIPLOSTIM (roe mi PLOE stim) helps your body make more platelets. This medicine is used to treat low platelets caused by chronic idiopathic thrombocytopenic purpura (ITP). This medicine may be used for other purposes; ask your health care provider or pharmacist if you have questions. COMMON BRAND NAME(S): Nplate What should I tell my health care provider before I take this medicine? They need to know if you have any of these conditions:  bleeding disorders  bone marrow problem, like blood cancer or myelodysplastic syndrome  history of blood clots  liver disease  surgery to remove your spleen  an unusual or allergic reaction to romiplostim, mannitol, other medicines, foods, dyes, or preservatives  pregnant or trying to get pregnant  breast-feeding How should I use this medicine? This medicine is for injection under the skin. It is given by a health care professional in a hospital or clinic setting. A special MedGuide will be given to you before your injection. Read this information carefully each time. Talk to your pediatrician regarding the use of this medicine in children. While this drug may be prescribed for children as young as 1 year for selected conditions, precautions do apply. Overdosage: If you think you have taken too much of this medicine contact a poison control center or emergency room at once. NOTE: This medicine is only for you. Do not share this medicine with others. What if I miss a dose? It is important not to miss your dose. Call your doctor or health care professional if you are unable to keep an appointment. What may interact with this medicine? Interactions are not expected. This list may not describe all possible interactions. Give your health care provider a list of all the medicines, herbs, non-prescription drugs, or dietary supplements you use. Also tell them if you smoke, drink alcohol, or use illegal drugs.  Some items may interact with your medicine. What should I watch for while using this medicine? Your condition will be monitored carefully while you are receiving this medicine. Visit your prescriber or health care professional for regular checks on your progress and for the needed blood tests. It is important to keep all appointments. What side effects may I notice from receiving this medicine? Side effects that you should report to your doctor or health care professional as soon as possible:  allergic reactions like skin rash, itching or hives, swelling of the face, lips, or tongue  signs and symptoms of bleeding such as bloody or black, tarry stools; red or dark brown urine; spitting up blood or brown material that looks like coffee grounds; red spots on the skin; unusual bruising or bleeding from the eyes, gums, or nose  signs and symptoms of a blood clot such as chest pain; shortness of breath; pain, swelling, or warmth in the leg  signs and symptoms of a stroke like changes in vision; confusion; trouble speaking or understanding; severe headaches; sudden numbness or weakness of the face, arm or leg; trouble walking; dizziness; loss of balance or coordination Side effects that usually do not require medical attention (report to your doctor or health care professional if they continue or are bothersome):  headache  pain in arms and legs  pain in mouth  stomach pain This list may not describe all possible side effects. Call your doctor for medical advice about side effects. You may report side effects to FDA at 1-800-FDA-1088. Where should I keep my medicine? This drug is given in a hospital or clinic   and will not be stored at home. NOTE: This sheet is a summary. It may not cover all possible information. If you have questions about this medicine, talk to your doctor, pharmacist, or health care provider.  2020 Elsevier/Gold Standard (2017-02-16 11:10:55)  

## 2019-10-21 NOTE — Progress Notes (Signed)
[facility] OFFICE PROGRESS NOTE   Diagnosis: ITP  INTERVAL HISTORY:   [name] returns as scheduled. He continues prednisone at a dose of 10 mg daily. Dyspnea has improved since undergoing the TAVR surgery.  Objective:  Vital signs in last 24 hours:  Blood pressure 135/70, pulse 70, temperature 97.8 F (36.6 C), temperature source Oral, resp. rate 16, height '5\' 4"'  (1.626 m), weight 161 lb 14.4 oz (73.4 kg), SpO2 93 %.    HEENT: No thrush Resp: Lungs clear bilaterally Cardio: Regular rate and rhythm GI: Soft and nontender, no hepatomegaly, small tender hematoma at the right groin puncture site-no bleeding Vascular: Trace pitting edema at the lower leg bilaterally  Lab Results:  Lab Results  Component Value Date   WBC 17.1 (H) 10/21/2019   HGB 11.5 (L) 10/21/2019   HCT 35.7 (L) 10/21/2019   MCV 80.2 10/21/2019   PLT 203 10/21/2019   NEUTROABS PENDING 10/21/2019    CMP  Lab Results  Component Value Date   NA 135 09/23/2019   K 4.3 09/23/2019   CL 103 09/23/2019   CO2 19 (L) 09/23/2019   GLUCOSE 108 (H) 09/23/2019   BUN 30 (H) 09/23/2019   CREATININE 1.20 09/23/2019   CALCIUM 9.1 09/23/2019   PROT 4.6 (L) 09/18/2019   ALBUMIN 2.2 (L) 09/18/2019   AST 19 09/18/2019   ALT 21 09/18/2019   ALKPHOS 59 09/18/2019   BILITOT 0.5 09/18/2019   GFRNONAA 52 (L) 09/23/2019   GFRAA >60 09/23/2019     Imaging:  ECHOCARDIOGRAM COMPLETE  Result Date: 10/19/2019    ECHOCARDIOGRAM REPORT   Patient Name:   [name] [place] Date of Exam: 10/19/2019 Medical Rec #:  [id number]     Height:       64.0 in Accession #:    [id number]    Weight:       162.0 lb Date of Birth:  05-10-1923    BSA:          1.789 m Patient Age:    62 years      BP:           134/73 mmHg Patient Gender: M             HR:           70 bpm. Exam Location:  [address] Procedure: 2D Echo, Cardiac Doppler and Color Doppler Indications:    Z95.2 s/p TAVR  History:        Patient has prior history  of Echocardiogram examinations, most                 recent 09/07/2019. CAD, Aortic Valve Disease, Arrythmias:Atrial                 Fibrillation; Risk Factors:Hypertension.                 Aortic Valve: 26 mm Edwards Sapien prosthetic, stented (TAVR)                 valve is present in the aortic position. Procedure Date:                 09/06/19.  Sonographer:    Marygrace Drought RCS Referring Phys: [id number] [place]  1. Left ventricular ejection fraction, by estimation, is 60 to 65%. The left ventricle has normal function. The left ventricle has no regional wall motion abnormalities. There is mild concentric left ventricular hypertrophy. Left ventricular diastolic parameters are  consistent with Grade I diastolic dysfunction (impaired relaxation). Elevated left ventricular end-diastolic pressure.  2. Right ventricular systolic function is normal. The right ventricular size is normal. There is normal pulmonary artery systolic pressure.  3. The mitral valve is normal in structure. Trivial mitral valve regurgitation. No evidence of mitral stenosis.  4. Mean gradient 23 mmHg, unchanged from 25 mmHg on 09/2019. The aortic valve is normal in structure. Aortic valve regurgitation is not visualized. Moderate aortic valve stenosis. There is a 26 mm Edwards Sapien prosthetic (TAVR) valve present in the aortic position. Procedure Date: 09/06/19. Aortic valve area, by VTI measures 1.01 cm. Aortic valve mean gradient measures 23.0 mmHg. Aortic valve Vmax measures 3.27 m/s.  5. Aortic dilatation noted. There is mild dilatation of the ascending aorta.  6. The inferior vena cava is normal in size with greater than 50% respiratory variability, suggesting right atrial pressure of 3 mmHg. FINDINGS  Left Ventricle: Left ventricular ejection fraction, by estimation, is 60 to 65%. The left ventricle has normal function. The left ventricle has no regional wall motion abnormalities. The left ventricular internal cavity  size was normal in size. There is  mild concentric left ventricular hypertrophy. Left ventricular diastolic parameters are consistent with Grade I diastolic dysfunction (impaired relaxation). Elevated left ventricular end-diastolic pressure. Right Ventricle: The right ventricular size is normal. No increase in right ventricular wall thickness. Right ventricular systolic function is normal. There is normal pulmonary artery systolic pressure. The tricuspid regurgitant velocity is 2.64 m/s, and  with an assumed right atrial pressure of 3 mmHg, the estimated right ventricular systolic pressure is 14.4 mmHg. Left Atrium: Left atrial size was normal in size. Right Atrium: Right atrial size was normal in size. Pericardium: There is no evidence of pericardial effusion. Mitral Valve: The mitral valve is normal in structure. Normal mobility of the mitral valve leaflets. Trivial mitral valve regurgitation. No evidence of mitral valve stenosis. Tricuspid Valve: The tricuspid valve is normal in structure. Tricuspid valve regurgitation is trivial. No evidence of tricuspid stenosis. Aortic Valve: Mean gradient 23 mmHg, unchanged from 25 mmHg on 09/2019. The aortic valve is normal in structure. Aortic valve regurgitation is not visualized. Moderate aortic stenosis is present. Aortic valve mean gradient measures 23.0 mmHg. Aortic valve  peak gradient measures 42.8 mmHg. Aortic valve area, by VTI measures 1.01 cm. There is a 26 mm Edwards Sapien prosthetic, stented (TAVR) valve present in the aortic position. Procedure Date: 09/06/19. Pulmonic Valve: The pulmonic valve was normal in structure. Pulmonic valve regurgitation is mild. No evidence of pulmonic stenosis. Aorta: Aortic dilatation noted. There is mild dilatation of the ascending aorta. Venous: The inferior vena cava is normal in size with greater than 50% respiratory variability, suggesting right atrial pressure of 3 mmHg. IAS/Shunts: No atrial level shunt detected by color  flow Doppler.  LEFT VENTRICLE PLAX 2D LVIDd:         3.60 cm  Diastology LVIDs:         2.30 cm  LV e' lateral:   5.44 cm/s LV PW:         1.30 cm  LV E/e' lateral: 18.3 LV IVS:        1.20 cm  LV e' medial:    3.05 cm/s LVOT diam:     2.00 cm  LV E/e' medial:  32.7 LV SV:         69 LV SV Index:   38 LVOT Area:     3.14 cm  RIGHT VENTRICLE  RV Basal diam:  4.50 cm RV S prime:     17.60 cm/s TAPSE (M-mode): 2.2 cm RVSP:           30.9 mmHg LEFT ATRIUM             Index       RIGHT ATRIUM           Index LA diam:        3.80 cm 2.12 cm/m  RA Pressure: 3.00 mmHg LA Vol (A2C):   47.7 ml 26.67 ml/m RA Area:     16.90 cm LA Vol (A4C):   50.2 ml 28.06 ml/m RA Volume:   47.50 ml  26.55 ml/m LA Biplane Vol: 49.2 ml 27.50 ml/m  AORTIC VALVE AV Area (Vmax):    1.26 cm AV Area (Vmean):   1.03 cm AV Area (VTI):     1.01 cm AV Vmax:           327.00 cm/s AV Vmean:          220.000 cm/s AV VTI:            0.679 m AV Peak Grad:      42.8 mmHg AV Mean Grad:      23.0 mmHg LVOT Vmax:         131.00 cm/s LVOT Vmean:        72.400 cm/s LVOT VTI:          0.219 m LVOT/AV VTI ratio: 0.32  AORTA Ao Root diam: 3.40 cm Ao Asc diam:  4.00 cm MITRAL VALVE                TRICUSPID VALVE MV Area (PHT):              TR Peak grad:   27.9 mmHg MV Decel Time:              TR Vmax:        264.00 cm/s MV E velocity: 99.80 cm/s   Estimated RAP:  3.00 mmHg MV A velocity: 132.00 cm/s  RVSP:           30.9 mmHg MV E/A ratio:  0.76                             SHUNTS                             Systemic VTI:  0.22 m                             Systemic Diam: 2.00 cm Skeet Latch MD Electronically signed by Skeet Latch MD Signature Date/Time: 10/19/2019/9:33:26 PM    Final     Medications: I have reviewed the patient's current medications.   Assessment/Plan: 1. Thrombocytopenia ? Bone marrow biopsy 07/08/2016-no evidence of B-cell lymphoma, 40% cellular bone marrow with trilineage hematopoiesis, megakaryocytes present with normal  morphology, normal cytogenetics, negative for BRAF mutation ? Flow cytometry 01/05/2009-no monoclonal B-cell or phenotypically abnormal T-cell population ? Prednisone starting 06/29/2019 ? Nplate 07/04/2019, 8/36/6294,TMLYYT 07/18/2019 ? Prednisone 20 mg daily beginning 07/25/2019 ? Platelets 11,000 on 08/15/2019, prednisone increased to 40 mg daily, Nplate resume 0/35/4656 ? Prednisone decreased to 30 mg daily 08/25/2019, Nplate continued ? Prednisone continued at 30 mg daily 09/13/2019, weekly Nplate continued ? Prednisone taper to 20 mg daily 09/23/2019, weekly  Nplate continued ? Prednisone taper to 10 mg daily 09/30/2019, weekly Nplate continued ?  2. Hairy cell leukemia 1982, status post splenectomy 3. Coronary artery disease 4. Aortic stenosis 5. 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 Macular degeneration 7. Hearing loss 8. Basal cell carcinoma left cheek 05/24/2019 9. Left upper lobe nodule, faint FDG activity on PET at [facility] 05/31/2013 10. Status post TAVR procedure 09/06/2019 11. Admission with Pseudomonas urosepsis 09/16/2019 12. Acute left MCA distribution CVA 09/16/2019    Disposition: [name] appears stable. The platelet count is in the normal range. The prednisone will be tapered to 5 mg daily. He will continue Nplate. We will consider a change to Promacta after he returns from a planned vacation.  He will return for Nplate and labs on 6/56/8127, 11/02/2019, and 11/11/2019. He will be scheduled for an office visit on 11/11/2019.  [name] will be scheduled for the COVID-19 booster vaccine when he returns on 10/27/2019. We discussed the risk of thrombocytopenia following the Covid vaccine. He agrees to proceed.  Betsy Coder, MD  10/21/2019  9:17 AM

## 2019-10-22 DIAGNOSIS — D696 Thrombocytopenia, unspecified: Secondary | ICD-10-CM | POA: Diagnosis not present

## 2019-10-22 DIAGNOSIS — I2510 Atherosclerotic heart disease of native coronary artery without angina pectoris: Secondary | ICD-10-CM | POA: Diagnosis not present

## 2019-10-22 DIAGNOSIS — N401 Enlarged prostate with lower urinary tract symptoms: Secondary | ICD-10-CM | POA: Diagnosis not present

## 2019-10-22 DIAGNOSIS — R338 Other retention of urine: Secondary | ICD-10-CM | POA: Diagnosis not present

## 2019-10-22 DIAGNOSIS — E876 Hypokalemia: Secondary | ICD-10-CM | POA: Diagnosis not present

## 2019-10-22 DIAGNOSIS — B965 Pseudomonas (aeruginosa) (mallei) (pseudomallei) as the cause of diseases classified elsewhere: Secondary | ICD-10-CM | POA: Diagnosis not present

## 2019-10-22 DIAGNOSIS — Z452 Encounter for adjustment and management of vascular access device: Secondary | ICD-10-CM | POA: Diagnosis not present

## 2019-10-22 DIAGNOSIS — H409 Unspecified glaucoma: Secondary | ICD-10-CM | POA: Diagnosis not present

## 2019-10-22 DIAGNOSIS — R918 Other nonspecific abnormal finding of lung field: Secondary | ICD-10-CM | POA: Diagnosis not present

## 2019-10-22 DIAGNOSIS — I350 Nonrheumatic aortic (valve) stenosis: Secondary | ICD-10-CM | POA: Diagnosis not present

## 2019-10-22 DIAGNOSIS — M5136 Other intervertebral disc degeneration, lumbar region: Secondary | ICD-10-CM | POA: Diagnosis not present

## 2019-10-22 DIAGNOSIS — N390 Urinary tract infection, site not specified: Secondary | ICD-10-CM | POA: Diagnosis not present

## 2019-10-22 DIAGNOSIS — M47816 Spondylosis without myelopathy or radiculopathy, lumbar region: Secondary | ICD-10-CM | POA: Diagnosis not present

## 2019-10-22 DIAGNOSIS — I480 Paroxysmal atrial fibrillation: Secondary | ICD-10-CM | POA: Diagnosis not present

## 2019-10-22 DIAGNOSIS — I110 Hypertensive heart disease with heart failure: Secondary | ICD-10-CM | POA: Diagnosis not present

## 2019-10-22 DIAGNOSIS — M810 Age-related osteoporosis without current pathological fracture: Secondary | ICD-10-CM | POA: Diagnosis not present

## 2019-10-22 DIAGNOSIS — F419 Anxiety disorder, unspecified: Secondary | ICD-10-CM | POA: Diagnosis not present

## 2019-10-22 DIAGNOSIS — K219 Gastro-esophageal reflux disease without esophagitis: Secondary | ICD-10-CM | POA: Diagnosis not present

## 2019-10-22 DIAGNOSIS — E785 Hyperlipidemia, unspecified: Secondary | ICD-10-CM | POA: Diagnosis not present

## 2019-10-22 DIAGNOSIS — I5032 Chronic diastolic (congestive) heart failure: Secondary | ICD-10-CM | POA: Diagnosis not present

## 2019-10-22 DIAGNOSIS — E871 Hypo-osmolality and hyponatremia: Secondary | ICD-10-CM | POA: Diagnosis not present

## 2019-10-22 DIAGNOSIS — R7303 Prediabetes: Secondary | ICD-10-CM | POA: Diagnosis not present

## 2019-10-22 DIAGNOSIS — D509 Iron deficiency anemia, unspecified: Secondary | ICD-10-CM | POA: Diagnosis not present

## 2019-10-22 DIAGNOSIS — R011 Cardiac murmur, unspecified: Secondary | ICD-10-CM | POA: Diagnosis not present

## 2019-10-24 ENCOUNTER — Telehealth: Payer: Self-pay | Admitting: Oncology

## 2019-10-24 ENCOUNTER — Telehealth (HOSPITAL_COMMUNITY): Payer: Self-pay | Admitting: *Deleted

## 2019-10-24 NOTE — Telephone Encounter (Signed)
Scheduled appointments per 8/20 los. Patient is aware of appointments dates and times.

## 2019-10-24 NOTE — Telephone Encounter (Signed)
-----   Message from Ladell Pier, MD sent at 10/24/2019 12:37 PM EDT ----- Regarding: RE: Ok to participate in group setting for exercise He should be ok,  but increased risk for covid complications due to age and comorbid conditions ----- Message ----- From: Rowe Pavy, RN Sent: 10/24/2019  10:30 AM EDT To: Ladell Pier, MD Subject: Ok to participate in group setting for exerc#   Dr. Benay Spice,  The above pt is eligible to participate in group exercise again s/p TAVR procedure.  Noted in his medical history Immune Thrombocytopenia with weekly infusions. Plans to get the Covid vaccine Booster when he returns.  Are there any additional precautions we need to follow?  Presently all patients and staff where mask, screened prior to entering the gym. Plexi glass screens in between the equipment and patients stay in their designated pods of 2 pieces of equipment the entire time.   Thanks so much for your advisement!  Cherre Huger, BSN Cardiac and Training and development officer

## 2019-10-25 DIAGNOSIS — B965 Pseudomonas (aeruginosa) (mallei) (pseudomallei) as the cause of diseases classified elsewhere: Secondary | ICD-10-CM | POA: Diagnosis not present

## 2019-10-25 DIAGNOSIS — N390 Urinary tract infection, site not specified: Secondary | ICD-10-CM | POA: Diagnosis not present

## 2019-10-25 DIAGNOSIS — Z452 Encounter for adjustment and management of vascular access device: Secondary | ICD-10-CM | POA: Diagnosis not present

## 2019-10-25 DIAGNOSIS — I110 Hypertensive heart disease with heart failure: Secondary | ICD-10-CM | POA: Diagnosis not present

## 2019-10-25 DIAGNOSIS — I480 Paroxysmal atrial fibrillation: Secondary | ICD-10-CM | POA: Diagnosis not present

## 2019-10-25 DIAGNOSIS — I2510 Atherosclerotic heart disease of native coronary artery without angina pectoris: Secondary | ICD-10-CM | POA: Diagnosis not present

## 2019-10-26 ENCOUNTER — Encounter: Payer: Self-pay | Admitting: Oncology

## 2019-10-26 NOTE — H&P (View-Only) (Signed)
[facility]                                       Cardiology Office Note    Date:  10/27/2019   ID:  [name], DOB August 08, 1923, MRN [id number]  PCP:  [name], MD  Cardiologist:  Sinclair Grooms, MD /Dr. Angelena Form & Dr. Cyndia Bent (TAVR)  CC: worsening shortness of breath   History of Present Illness:  [name] is a 86 y.o. male with a history of idiopathic thrombocytopenia (Rx'd with Nplate and steroids),bifasicular block (RBBB, LAFB), anemia, anxiety, CAD, colon cancer,HLD, GERD, hairy cell leukemia, PAF found on monitor, HTN and severe aortic stenosiss/p TAVR (09/06/19) followed by a readmission for urosepsis/bacteremia who presents to clinic for evaluation of worsening shortness of breath.   He has been followed by Dr. Tamala Julian with what is felt to be a moderate aortic stenosis. An echocardiogram in October 2020 showed a mean gradient of 32 mmHg with a peak gradient of 55 mmHg and a valve area of 0.85 cm. Left ventricular ejection fraction was 60 to 65%. He now presents with progressive exertional fatigue and shortness of breath which he says has been going on for several years but recently getting worse to the point where he gets fatigue and shortness of breath with going up stairs or an incline. He has had generalized weakness in his legs with any exercise.Repeat echocardiogram on 06/13/2019 showed further progression of his aortic stenosis with a mean gradient of 43 mmHg and a peak gradient of 67 mmHg. Aortic valve area by VTI was 0.76 cm with a dimensionless index of 0.24 consistent with severe aortic stenosis. Left ventricular ejection fraction remained 60 to 65% with grade 1 diastolic dysfunction.L/RHC on 07/26/19 showedchronically occluded mid LAD, moderate disease in the diagonals and mid RCA. Medical therapy recommended for CAD.  He wasevaluated by the multidisciplinary valve team and underwentsuccessful  TAVR with a23mm Edwards Sapien 3 UltraTHV via the TF approach on7/6/21. Post operative echoshowed EF 65-70%, normally functioningTAVRwith a mean gradient of 11 mm Hg and no PVL.He was discharged home onAsprin alone given historyof thrombocytopenia.Given underlying conduction disease and TAVR a Zio patch was placed at discharge. Additionally, he hadcontinued issues with urinary retentionand wasdischarged with a foley catheter in place.  We were alerted by Zio on 7/12 of new onset afib.Plan is to assess afib burden before discussing anticoagulation given ongoing thrombocytopenia.  He was later 7/16-7/21/21 for urosepsis/bacteremia and CVA by CT. Echo during this admission showed EF 65% with increased mean gradient to 25 mm Hg. TEE was recommended. He was discharged home on IV abx (4 full weeks) and TEE was deferred.  1 month echo showed EF 65%, normally functioning TAVR with a mean gradient of 23 mm hg (previously 25 mm Hg when admitted for bacteremia) and no PVL. Plan was to repeat echo in three months to follow gradients. Zio patch showed no HAVB and 2% afib burden. Given elderly age and thrombocytopenia, oral anticoagulation was deferred.   He called into the office worried about his increasing gradients and also complained of worsening dyspnea and was added onto my clinic schedule. He has been having worsening dyspnea to the point that he cannot walk across the room without being short of breath. He has no s/s CHF. Weight has been stable. Kidney function has recently worsened  and lasix decreased to 20mg  daily. His O2 sats are mildly decreased at 92% today. He did have a mild fever on Saturday which has now resolved. He is completely vaccinated for covid 19 with no his risk exposures. He is very discouraged with his progress and wants to feel better.     Past Medical History:  Diagnosis Date  . Acute appendicitis   . Anemia   . Anxiety   . Arthritis    "back" (03/14/2014)  .  Blood dyscrasia    hairy cell leukemia  . CAD (coronary artery disease)    a. 03/14/14  s/p overlapping DES x2 to mid-distal RCA.  . 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 Colon cancer ([place]) 1984  . Compression fracture of lumbar spine, non-traumatic ([place])   . DJD (degenerative joint disease) of lumbar spine   . Dyslipidemia   . Dyspnea   . Elevated PSA   . GERD (gastroesophageal reflux disease)   . Glaucoma   . Hairy cell leukemia ([place]) dx'd 1980  . Heart murmur   . History of blood transfusion    "several; related to hairy cell leukemia & tx "  . History of stomach ulcers 1968  . Hypertension   . Leukemia, hairy cell ([place])   . Malnutrition ([place])   . Osteoarthritis   . Osteoporosis   . Pneumonia   . 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 Angelena Form and Cyndia Bent.   . Severe aortic stenosis    s/p tavr  . Skin cancer of face     Past Surgical History:  Procedure Laterality Date  . APPENDECTOMY  10/2007  . CATARACT EXTRACTION, BILATERAL Bilateral 02/2008  . COLON SURGERY  1984   "sigmoid; open"  . CORONARY ANGIOPLASTY WITH STENT PLACEMENT  03/14/2014   "2"  . EYE SURGERY Bilateral    cataract  . FRACTURE SURGERY    . LEFT HEART CATHETERIZATION WITH CORONARY ANGIOGRAM N/A 03/14/2014   Procedure: LEFT HEART CATHETERIZATION WITH CORONARY ANGIOGRAM;  Surgeon: Peter M Martinique, MD;  Location: [facility] CATH LAB;  Service: Cardiovascular;  Laterality: N/A;  . LIPOMA EXCISION Right 07/2011   liposarcoma resection; "back"  . MOHS SURGERY Left 02/2011  . MOHS SURGERY  X 3   "all on my face"  . MOLE REMOVAL Left 1985   cheek  . ORIF ANKLE FRACTURE Left 2000  . RIGHT/LEFT HEART CATH AND CORONARY ANGIOGRAPHY N/A 07/26/2019   Procedure: RIGHT/LEFT HEART CATH AND CORONARY ANGIOGRAPHY;  Surgeon: Belva Crome, MD;  Location: [facility] LAB;  Service: Cardiovascular;  Laterality: N/A;  .  SHOULDER SURGERY  04/2004  . SPLENECTOMY  1992  . TEE WITHOUT CARDIOVERSION N/A 09/06/2019   Procedure: TRANSESOPHAGEAL ECHOCARDIOGRAM (TEE);  Surgeon: Burnell Blanks, MD;  Location: [facility] LAB;  Service: Open Heart Surgery;  Laterality: N/A;  . TONSILLECTOMY AND ADENOIDECTOMY  1939  . TRANSCATHETER AORTIC VALVE REPLACEMENT, TRANSFEMORAL N/A 09/06/2019   Procedure: TRANSCATHETER AORTIC VALVE REPLACEMENT, TRANSFEMORAL;  Surgeon: Burnell Blanks, MD;  Location: [facility] LAB;  Service: Open Heart Surgery;  Laterality: N/A;    Current Medications: Outpatient Medications Prior to Visit  Medication Sig Dispense Refill  . amoxicillin (AMOXIL) 500 MG tablet Take 4 capsules (2,000 mg) one hour prior to all dental visits. 8 tablet 11  . aspirin EC 81 MG EC tablet Take 1 tablet (81 mg  total) by mouth daily. Swallow whole. 30 tablet 11  . atorvastatin (LIPITOR) 20 MG tablet TAKE 1 TABLET BY MOUTH DAILY AT 6 PM (Patient taking differently: Take 20 mg by mouth daily at 6 PM. ) 90 tablet 3  . Cholecalciferol (VITAMIN D3) 50 MCG (2000 UT) TABS Take 2,000 Units by mouth every evening.    Marland Kitchen desonide (DESOWEN) 0.05 % cream Apply 1 application topically 2 (two) times daily as needed (rash/irritation.).     Marland Kitchen fluocinonide (LIDEX) 0.05 % external solution Apply 1 application topically 2 (two) times daily as needed (apply to ears).   11  . furosemide (LASIX) 40 MG tablet Take 0.5 tablets (20 mg total) by mouth daily.    Marland Kitchen latanoprost (XALATAN) 0.005 % ophthalmic solution Place 1 drop into both eyes at bedtime.     . metoprolol tartrate (LOPRESSOR) 25 MG tablet Take 0.5 tablets (12.5 mg total) by mouth daily. 90 tablet 2  . Multiple Vitamins-Minerals (PRESERVISION AREDS 2 PO) Take 1 capsule by mouth 2 (two) times daily.     . nitroGLYCERIN (NITROSTAT) 0.4 MG SL tablet Place 1 tablet (0.4 mg total) under the tongue every 5 (five) minutes as needed for chest pain. Please make yearly appt with  Dr. Tamala Julian for September. 1st attempt 25 tablet 2  . pantoprazole (PROTONIX) 40 MG tablet TAKE 1 TABLET BY MOUTH DAILY GENERIC EQUIVALENT FOR PROTONIX (Patient taking differently: Take 40 mg by mouth daily. ) 90 tablet 2  . potassium chloride (KLOR-CON) 10 MEQ tablet Take 1 tablet (10 mEq total) by mouth daily. 90 tablet 3  . predniSONE (DELTASONE) 10 MG tablet TAKE 4 TABLETS(40 MG) BY MOUTH DAILY WITH BREAKFAST (Patient taking differently: Take 5 mg by mouth daily with breakfast. ) 120 tablet 1  . ranibizumab (LUCENTIS) 0.5 MG/0.05ML SOLN 0.5 mg by Intravitreal route every 6 (six) weeks. Left Eye ONLY    . sertraline (ZOLOFT) 50 MG tablet Take 50 mg by mouth every evening.     . tamsulosin (FLOMAX) 0.4 MG CAPS capsule Take 0.8 mg by mouth daily after supper.     . timolol (BETIMOL) 0.5 % ophthalmic solution Place 1 drop into the right eye daily.     Marland Kitchen triamcinolone cream (KENALOG) 0.1 % Apply 1 application topically 2 (two) times daily as needed (itching).  (Patient not taking: Reported on 10/27/2019)     No facility-administered medications prior to visit.     Allergies:   Antazoline; Antihistamines, chlorpheniramine-type; and Sulfa antibiotics   Social History   Socioeconomic History  . Marital status: Married    Spouse name: Not on file  . Number of children: Not on file  . Years of education: Not on file  . Highest education level: Not on file  Occupational History  . Not on file  Tobacco Use  . Smoking status: Former Smoker    Packs/day: 0.25    Years: 1.00    Pack years: 0.25    Types: Cigarettes, Cigars  . Smokeless tobacco: Never Used  . Tobacco comment: occasional social smoker during college.  Vaping Use  . Vaping Use: Never used  Substance and Sexual Activity  . Alcohol use: Yes    Alcohol/week: 9.0 standard drinks    Types: 2 Glasses of wine, 2 Shots of liquor, 5 Standard drinks or equivalent per week    Comment: socially  . Drug use: No  . Sexual activity: Never    Other Topics Concern  . Not on file  Social  History Narrative  . Not on file   Social Determinants of Health   Financial Resource Strain:   . Difficulty of Paying Living Expenses: Not on file  Food Insecurity: No Food Insecurity  . Worried About Charity fundraiser in the Last Year: Never true  . Ran Out of Food in the Last Year: Never true  Transportation Needs: No Transportation Needs  . Lack of Transportation (Medical): No  . Lack of Transportation (Non-Medical): No  Physical Activity:   . Days of Exercise per Week: Not on file  . Minutes of Exercise per Session: Not on file  Stress:   . Feeling of Stress : Not on file  Social Connections:   . Frequency of Communication with Friends and Family: Not on file  . Frequency of Social Gatherings with Friends and Family: Not on file  . Attends Religious Services: Not on file  . Active Member of Clubs or Organizations: Not on file  . Attends Archivist Meetings: Not on file  . Marital Status: Not on file     Family History:  The patient's family history includes Congestive Heart Failure (age of onset: 83) in his father; Diabetes Mellitus II in his brother; Heart Problems in his brother; Prostate cancer in his brother; Stroke in his mother.     ROS:   Please see the history of present illness.    ROS All other systems reviewed and are negative.   PHYSICAL EXAM:   VS:  BP (!) 110/56   Pulse 69   Ht 5\' 4"  (1.626 m)   Wt 162 lb (73.5 kg)   SpO2 92%   BMI 27.81 kg/m    GEN: Well nourished, well developed, in no acute distress HEENT: normal Neck: no JVD or masses Cardiac: RRR; no murmurs, rubs, or gallop. Trace bilateral LE edema Respiratory:  clear to auscultation bilaterally, normal work of breathing GI: soft, nontender, nondistended, + BS MS: no deformity or atrophy Skin: warm and dry, no rash Neuro:  Alert and Oriented x 3, Strength and sensation are intact Psych: euthymic mood, full affect   Wt Readings  from Last 3 Encounters:  10/27/19 162 lb (73.5 kg)  10/21/19 161 lb 14.4 oz (73.4 kg)  10/20/19 161 lb (73 kg)      Studies/Labs Reviewed:   EKG:  EKG is NOT ordered today.   Recent Labs: 09/02/2019: B Natriuretic Peptide 296.9 09/18/2019: ALT 21 09/21/2019: Magnesium 1.9 10/27/2019: BUN 31; Creatinine 1.60; Hemoglobin 11.3; Platelet Count 211; Potassium 3.9; Sodium 139   Lipid Panel    Component Value Date/Time   CHOL 96 09/17/2019 0752   TRIG 115 09/17/2019 0752   HDL 30 (L) 09/17/2019 0752   CHOLHDL 3.2 09/17/2019 0752   VLDL 23 09/17/2019 0752   LDLCALC 43 09/17/2019 0752    Additional studies/ records that were reviewed today include:  TAVR OPERATIVE NOTE   Date of Procedure:09/06/2019  Preoperative Diagnosis:Severe Aortic Stenosis   Postoperative Diagnosis:Same   Procedure:   Transcatheter Aortic Valve Replacement - PercutaneousRightTransfemoral Approach Edwards Sapien 3 Ultra THV (size 25mm, model # 9750TFX, serial # [id number])  Co-Surgeons:Bryan Alveria Apley, MD / Lauree Chandler, MD   Anesthesiologist:R. Ola Spurr, MD  Anne Hahn, MD  Pre-operative Echo Findings: ? Severe aortic stenosis ? Normalleft ventricular systolic function  Post-operative Echo Findings: ? Noparavalvular leak ? Normalleft ventricular systolic function  ____________________   Echo7/7/21: IMPRESSIONS  1. Day 1 post TAVR with normal transaortic gradients with peak/mean 18/11 mmHg. Trivial  paravalvular leak.  2. Left ventricular ejection fraction, by estimation, is 65 to 70%. The  left ventricle has normal function. The left ventricle has no regional  wall motion abnormalities. Left ventricular diastolic function could not  be evaluated.  3. Right ventricular systolic function is normal. The right ventricular  size is  normal. There is normal pulmonary artery systolic pressure.  4. The mitral valve is normal in structure. Trivial mitral valve  regurgitation. No evidence of mitral stenosis.  5. The aortic valve has been repaired/replaced. Aortic valve  regurgitation is not visualized. No aortic stenosis is present. There is a  26 mm Ultra, stented (TAVR) valve present in the aortic position.  Procedure Date: 09/06/2019. Echo findings are  consistent with normal structure and function of the aortic valve  prosthesis. Aortic valve mean gradient measures 11.0 mmHg.  6. The inferior vena cava is normal in size with greater than 50%  respiratory variability, suggesting right atrial pressure of 3 mmHg.    ____________________  Echo 09/17/19 IMPRESSIONS  1. Left ventricular ejection fraction, by estimation, is 60 to 65%. The  left ventricle has normal function. The left ventricle has no regional  wall motion abnormalities. Left ventricular diastolic parameters are  consistent with Grade I diastolic  dysfunction (impaired relaxation). Elevated left atrial pressure.  2. Right ventricular systolic function is normal. The right ventricular  size is normal. There is normal pulmonary artery systolic pressure.  3. The mitral valve is normal in structure. Trivial mitral valve  regurgitation. No evidence of mitral stenosis.  4. Edwards Sapien 3 THV size 26 mm, model #[id number] is in the AV  position. Mildly elevated mean gradient of 25 mmHg based on published  normals, has signifcantly increased from prior study 09/07/19. The LVOT/ AV  VTI ratio has also decreased. Limited  morphological visualization of prosthetic valve in general with TTE, no  gross visualized thrombus or vegetation. Given presentation with  bacteremia, CVA, and increased gradient across prosthetic valve would  recommend TEE for better visualization. . The  aortic valve has been repaired/replaced. Aortic valve regurgitation is not    visualized. There is a 26 mm Edwards Sapien prosthetic (TAVR) valve  present in the aortic position. Procedure Date: 09/06/2019.  5. The inferior vena cava is normal in size with greater than 50%  respiratory variability, suggesting right atrial pressure of 3 mmHg.  ___________________   Echo 10/19/19 IMPRESSIONS  1. Left ventricular ejection fraction, by estimation, is 60 to 65%. The  left ventricle has normal function. The left ventricle has no regional  wall motion abnormalities. There is mild concentric left ventricular  hypertrophy. Left ventricular diastolic  parameters are consistent with Grade I diastolic dysfunction (impaired  relaxation). Elevated left ventricular end-diastolic pressure.  2. Right ventricular systolic function is normal. The right ventricular  size is normal. There is normal pulmonary artery systolic pressure.  3. The mitral valve is normal in structure. Trivial mitral valve  regurgitation. No evidence of mitral stenosis.  4. Mean gradient 23 mmHg, unchanged from 25 mmHg on 09/2019. The aortic  valve is normal in structure. Aortic valve regurgitation is not  visualized. Moderate aortic valve stenosis. There is a 26 mm Edwards  Sapien prosthetic (TAVR) valve present in the  aortic position. Procedure Date: 09/06/19. Aortic valve area, by VTI  measures 1.01 cm. Aortic valve mean gradient measures 23.0 mmHg. Aortic  valve Vmax measures 3.27 m/s.  5. Aortic dilatation noted. There is mild dilatation of the ascending  aorta.  6. The inferior vena cava is normal in size with greater than 50%  respiratory variability, suggesting right atrial pressure of 3 mmHg.   ASSESSMENT & PLAN:   Acute dyspnea: patient having worsening dyspnea to the point that he cannot walk across the room without being short of breath. He has no s/s CHF. Weight has been stable. Recent blood counts are stable. Kidney function has recently worsened (creat up to 1.6) and lasix  decreased to 20mg  daily. His O2 sats are mildly decreased at 92% today; however, he is not tachycardic and recent echo did not show any RV changes, so doubt pulmonary embolus. He did have a mild fever on Saturday which has now resolved. He is completely vaccinated for covid 19 with no his risk exposures. His echo did show that his aortic gradients were mildly elevated at 23 mmHg (up from 11 mm Hg on POD1 echo). Given recent admission for bacteremia and worsening renal function (creat up to 1.6; GRF 37) will plan to do a TEE to assess valve and rule out leaflet thickening (subacute leaflet thrombosis)/endocarditis.   Medication Adjustments/Labs and Tests Ordered: Current medicines are reviewed at length with the patient today.  Concerns regarding medicines are outlined above.  Medication changes, Labs and Tests ordered today are listed in the Patient Instructions below. Patient Instructions  Medication Instructions:  Your provider recommends that you continue on your current medications as directed. Please refer to the Current Medication list given to you today.   *If you need a refill on your cardiac medications before your next appointment, please call your pharmacy*  Testing/Procedures: Your physician has requested that you have a TEE. During a TEE, sound waves are used to create images of your heart. It provides your doctor with information about the size and shape of your heart and how well your heart's chambers and valves are working. In this test, a transducer is attached to the end of a flexible tube that's guided down your throat and into your esophagus (the tube leading from you mouth to your stomach) to get a more detailed image of your heart. You are not awake for the procedure. Please see the instruction sheet given to you today. For further information please visit [email or web address].  TESTING INFORMATION: COVID SCREENING: You are scheduled for your drive-thru COVID screening on Monday,  8/30 between 9AM and 2PM. Pre-Procedural COVID-19 Testing Site [address] [place], [place] [address] You will need to go home after your screening and quarantine until your procedure.   TEE INFORMATION: You are scheduled for a TEE on Wednesday, September 1 with Dr. Margaretann Loveless.  Please arrive at the [facility] (Main Entrance A) at [facility]: [address] [place], [place] [address] at 10 am (this is one hour prior to your procedure time).   DIET: Nothing to eat or drink after midnight except a sip of water with medications (see medication instructions below)  Medication Instructions: Hold LASIX the morning of your procedure You may take your other medications as directed with sips of water  You must have a responsible person to drive you home and stay in the waiting area during your procedure. Failure to do so could result in cancellation.  Bring your insurance cards.  *Special Note: Every effort is made to have your procedure done on time. Occasionally there are emergencies that occur at the hospital that may cause delays. Please be patient if a delay does occur.      Signed, Angelena Form, PA-C  10/27/2019 9:07 PM    [address] [place], [place], [place]  [address] Phone: [phone]; Fax: [phone]

## 2019-10-26 NOTE — Progress Notes (Addendum)
[facility]                                       Cardiology Office Note    Date:  10/27/2019   ID:  [name], DOB [birth year], MRN [id number]  PCP:  [name], MD  Cardiologist:  Sinclair Grooms, MD /Dr. Angelena Form & Dr. Cyndia Bent (TAVR)  CC: worsening shortness of breath   History of Present Illness:  [name] is a 86 y.o. male with a history of idiopathic thrombocytopenia (Rx'd with Nplate and steroids),bifasicular block (RBBB, LAFB), anemia, anxiety, CAD, colon cancer,HLD, GERD, hairy cell leukemia, PAF found on monitor, HTN and severe aortic stenosiss/p TAVR (09/06/19) followed by a readmission for urosepsis/bacteremia who presents to clinic for evaluation of worsening shortness of breath.   He has been followed by Dr. Tamala Julian with what is felt to be a moderate aortic stenosis. An echocardiogram in October 2020 showed a mean gradient of 32 mmHg with a peak gradient of 55 mmHg and a valve area of 0.85 cm. Left ventricular ejection fraction was 60 to 65%. He now presents with progressive exertional fatigue and shortness of breath which he says has been going on for several years but recently getting worse to the point where he gets fatigue and shortness of breath with going up stairs or an incline. He has had generalized weakness in his legs with any exercise.Repeat echocardiogram on 06/13/2019 showed further progression of his aortic stenosis with a mean gradient of 43 mmHg and a peak gradient of 67 mmHg. Aortic valve area by VTI was 0.76 cm with a dimensionless index of 0.24 consistent with severe aortic stenosis. Left ventricular ejection fraction remained 60 to 65% with grade 1 diastolic dysfunction.L/RHC on 07/26/19 showedchronically occluded mid LAD, moderate disease in the diagonals and mid RCA. Medical therapy recommended for CAD.  He wasevaluated by the multidisciplinary valve team and underwentsuccessful  TAVR with a61mm Edwards Sapien 3 UltraTHV via the TF approach on7/6/21. Post operative echoshowed EF 65-70%, normally functioningTAVRwith a mean gradient of 11 mm Hg and no PVL.He was discharged home onAsprin alone given historyof thrombocytopenia.Given underlying conduction disease and TAVR a Zio patch was placed at discharge. Additionally, he hadcontinued issues with urinary retentionand wasdischarged with a foley catheter in place.  We were alerted by Zio on 7/12 of new onset afib.Plan is to assess afib burden before discussing anticoagulation given ongoing thrombocytopenia.  He was later 7/16-7/21/21 for urosepsis/bacteremia and CVA by CT. Echo during this admission showed EF 65% with increased mean gradient to 25 mm Hg. TEE was recommended. He was discharged home on IV abx (4 full weeks) and TEE was deferred.  1 month echo showed EF 65%, normally functioning TAVR with a mean gradient of 23 mm hg (previously 25 mm Hg when admitted for bacteremia) and no PVL. Plan was to repeat echo in three months to follow gradients. Zio patch showed no HAVB and 2% afib burden. Given elderly age and thrombocytopenia, oral anticoagulation was deferred.   He called into the office worried about his increasing gradients and also complained of worsening dyspnea and was added onto my clinic schedule. He has been having worsening dyspnea to the point that he cannot walk across the room without being short of breath. He has no s/s CHF. Weight has been stable. Kidney function has recently worsened  and lasix decreased to 20mg  daily. His O2 sats are mildly decreased at 92% today. He did have a mild fever on Saturday which has now resolved. He is completely vaccinated for covid 19 with no his risk exposures. He is very discouraged with his progress and wants to feel better.     Past Medical History:  Diagnosis Date  . Acute appendicitis   . Anemia   . Anxiety   . Arthritis    "back" (03/14/2014)  .  Blood dyscrasia    hairy cell leukemia  . CAD (coronary artery disease)    a. 03/14/14  s/p overlapping DES x2 to mid-distal RCA.  . 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 Colon cancer ([place]) 1984  . Compression fracture of lumbar spine, non-traumatic ([place])   . DJD (degenerative joint disease) of lumbar spine   . Dyslipidemia   . Dyspnea   . Elevated PSA   . GERD (gastroesophageal reflux disease)   . Glaucoma   . Hairy cell leukemia ([place]) dx'd 1980  . Heart murmur   . History of blood transfusion    "several; related to hairy cell leukemia & tx "  . History of stomach ulcers 1968  . Hypertension   . Leukemia, hairy cell ([place])   . Malnutrition ([place])   . Osteoarthritis   . Osteoporosis   . Pneumonia   . 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 Angelena Form and Cyndia Bent.   . Severe aortic stenosis    s/p tavr  . Skin cancer of face     Past Surgical History:  Procedure Laterality Date  . APPENDECTOMY  10/2007  . CATARACT EXTRACTION, BILATERAL Bilateral 02/2008  . COLON SURGERY  1984   "sigmoid; open"  . CORONARY ANGIOPLASTY WITH STENT PLACEMENT  03/14/2014   "2"  . EYE SURGERY Bilateral    cataract  . FRACTURE SURGERY    . LEFT HEART CATHETERIZATION WITH CORONARY ANGIOGRAM N/A 03/14/2014   Procedure: LEFT HEART CATHETERIZATION WITH CORONARY ANGIOGRAM;  Surgeon: Peter M Martinique, MD;  Location: [facility] C[facility];  Service: Cardiovascular;  Laterality: N/A;  . LIPOMA EXCISION Right 07/2011   liposarcoma resection; "back"  . MOHS SURGERY Left 02/2011  . MOHS SURGERY  X 3   "all on my face"  . MOLE REMOVAL Left 1985   cheek  . ORIF ANKLE FRACTURE Left 2000  . RIGHT/LEFT HEART CATH AND CORONARY ANGIOGRAPHY N/A 07/26/2019   Procedure: RIGHT/LEFT HEART CATH AND CORONARY ANGIOGRAPHY;  Surgeon: Belva Crome, MD;  Location: [facility] CV LAB;  Service: Cardiovascular;  Laterality: N/A;  .  SHOULDER SURGERY  04/2004  . SPLENECTOMY  1992  . TEE WITHOUT CARDIOVERSION N/A 09/06/2019   Procedure: TRANSESOPHAGEAL ECHOCARDIOGRAM (TEE);  Surgeon: Burnell Blanks, MD;  Location: [facility];  Service: Open Heart Surgery;  Laterality: N/A;  . TONSILLECTOMY AND ADENOIDECTOMY  1939  . TRANSCATHETER AORTIC VALVE REPLACEMENT, TRANSFEMORAL N/A 09/06/2019   Procedure: TRANSCATHETER AORTIC VALVE REPLACEMENT, TRANSFEMORAL;  Surgeon: Burnell Blanks, MD;  Location: [facility];  Service: Open Heart Surgery;  Laterality: N/A;    Current Medications: Outpatient Medications Prior to Visit  Medication Sig Dispense Refill  . amoxicillin (AMOXIL) 500 MG tablet Take 4 capsules (2,000 mg) one hour prior to all dental visits. 8 tablet 11  . aspirin EC 81 MG EC tablet Take 1 tablet (81 mg  total) by mouth daily. Swallow whole. 30 tablet 11  . atorvastatin (LIPITOR) 20 MG tablet TAKE 1 TABLET BY MOUTH DAILY AT 6 PM (Patient taking differently: Take 20 mg by mouth daily at 6 PM. ) 90 tablet 3  . Cholecalciferol (VITAMIN D3) 50 MCG (2000 UT) TABS Take 2,000 Units by mouth every evening.    Marland Kitchen desonide (DESOWEN) 0.05 % cream Apply 1 application topically 2 (two) times daily as needed (rash/irritation.).     Marland Kitchen fluocinonide (LIDEX) 0.05 % external solution Apply 1 application topically 2 (two) times daily as needed (apply to ears).   11  . furosemide (LASIX) 40 MG tablet Take 0.5 tablets (20 mg total) by mouth daily.    Marland Kitchen latanoprost (XALATAN) 0.005 % ophthalmic solution Place 1 drop into both eyes at bedtime.     . metoprolol tartrate (LOPRESSOR) 25 MG tablet Take 0.5 tablets (12.5 mg total) by mouth daily. 90 tablet 2  . Multiple Vitamins-Minerals (PRESERVISION AREDS 2 PO) Take 1 capsule by mouth 2 (two) times daily.     . nitroGLYCERIN (NITROSTAT) 0.4 MG SL tablet Place 1 tablet (0.4 mg total) under the tongue every 5 (five) minutes as needed for chest pain. Please make yearly appt with  Dr. Tamala Julian for September. 1st attempt 25 tablet 2  . pantoprazole (PROTONIX) 40 MG tablet TAKE 1 TABLET BY MOUTH DAILY GENERIC EQUIVALENT FOR PROTONIX (Patient taking differently: Take 40 mg by mouth daily. ) 90 tablet 2  . potassium chloride (KLOR-CON) 10 MEQ tablet Take 1 tablet (10 mEq total) by mouth daily. 90 tablet 3  . predniSONE (DELTASONE) 10 MG tablet TAKE 4 TABLETS(40 MG) BY MOUTH DAILY WITH BREAKFAST (Patient taking differently: Take 5 mg by mouth daily with breakfast. ) 120 tablet 1  . ranibizumab (LUCENTIS) 0.5 MG/0.05ML SOLN 0.5 mg by Intravitreal route every 6 (six) weeks. Left Eye ONLY    . sertraline (ZOLOFT) 50 MG tablet Take 50 mg by mouth every evening.     . tamsulosin (FLOMAX) 0.4 MG CAPS capsule Take 0.8 mg by mouth daily after supper.     . timolol (BETIMOL) 0.5 % ophthalmic solution Place 1 drop into the right eye daily.     Marland Kitchen triamcinolone cream (KENALOG) 0.1 % Apply 1 application topically 2 (two) times daily as needed (itching).  (Patient not taking: Reported on 10/27/2019)     No facility-administered medications prior to visit.     Allergies:   Antazoline; Antihistamines, chlorpheniramine-type; and Sulfa antibiotics   Social History   Socioeconomic History  . Marital status: Married    Spouse name: Not on file  . Number of children: Not on file  . Years of education: Not on file  . Highest education level: Not on file  Occupational History  . Not on file  Tobacco Use  . Smoking status: Former Smoker    Packs/day: 0.25    Years: 1.00    Pack years: 0.25    Types: Cigarettes, Cigars  . Smokeless tobacco: Never Used  . Tobacco comment: occasional social smoker during college.  Vaping Use  . Vaping Use: Never used  Substance and Sexual Activity  . Alcohol use: Yes    Alcohol/week: 9.0 standard drinks    Types: 2 Glasses of wine, 2 Shots of liquor, 5 Standard drinks or equivalent per week    Comment: socially  . Drug use: No  . Sexual activity: Never    Other Topics Concern  . Not on file  Social  History Narrative  . Not on file   Social Determinants of Health   Financial Resource Strain:   . Difficulty of Paying Living Expenses: Not on file  Food Insecurity: No Food Insecurity  . Worried About Charity fundraiser in the Last Year: Never true  . Ran Out of Food in the Last Year: Never true  Transportation Needs: No Transportation Needs  . Lack of Transportation (Medical): No  . Lack of Transportation (Non-Medical): No  Physical Activity:   . Days of Exercise per Week: Not on file  . Minutes of Exercise per Session: Not on file  Stress:   . Feeling of Stress : Not on file  Social Connections:   . Frequency of Communication with Friends and Family: Not on file  . Frequency of Social Gatherings with Friends and Family: Not on file  . Attends Religious Services: Not on file  . Active Member of Clubs or Organizations: Not on file  . Attends Archivist Meetings: Not on file  . Marital Status: Not on file     Family History:  The patient's family history includes Congestive Heart Failure (age of onset: 61) in his father; Diabetes Mellitus II in his brother; Heart Problems in his brother; Prostate cancer in his brother; Stroke in his mother.     ROS:   Please see the history of present illness.    ROS All other systems reviewed and are negative.   PHYSICAL EXAM:   VS:  BP (!) 110/56   Pulse 69   Ht 5\' 4"  (1.626 m)   Wt 162 lb (73.5 kg)   SpO2 92%   BMI 27.81 kg/m    GEN: Well nourished, well developed, in no acute distress HEENT: normal Neck: no JVD or masses Cardiac: RRR; no murmurs, rubs, or gallop. Trace bilateral LE edema Respiratory:  clear to auscultation bilaterally, normal work of breathing GI: soft, nontender, nondistended, + BS MS: no deformity or atrophy Skin: warm and dry, no rash Neuro:  Alert and Oriented x 3, Strength and sensation are intact Psych: euthymic mood, full affect   Wt Readings  from Last 3 Encounters:  10/27/19 162 lb (73.5 kg)  10/21/19 161 lb 14.4 oz (73.4 kg)  10/20/19 161 lb (73 kg)      Studies/Labs Reviewed:   EKG:  EKG is NOT ordered today.   Recent Labs: 09/02/2019: B Natriuretic Peptide 296.9 09/18/2019: ALT 21 09/21/2019: Magnesium 1.9 10/27/2019: BUN 31; Creatinine 1.60; Hemoglobin 11.3; Platelet Count 211; Potassium 3.9; Sodium 139   Lipid Panel    Component Value Date/Time   CHOL 96 09/17/2019 0752   TRIG 115 09/17/2019 0752   HDL 30 (L) 09/17/2019 0752   CHOLHDL 3.2 09/17/2019 0752   VLDL 23 09/17/2019 0752   LDLCALC 43 09/17/2019 0752    Additional studies/ records that were reviewed today include:  TAVR OPERATIVE NOTE   Date of Procedure:09/06/2019  Preoperative Diagnosis:Severe Aortic Stenosis   Postoperative Diagnosis:Same   Procedure:   Transcatheter Aortic Valve Replacement - PercutaneousRightTransfemoral Approach Edwards Sapien 3 Ultra THV (size 109mm, model # 9750TFX, serial # [id number])  Co-Surgeons:Bryan Alveria Apley, MD / Lauree Chandler, MD   Anesthesiologist:R. Ola Spurr, MD  Anne Hahn, MD  Pre-operative Echo Findings: ? Severe aortic stenosis ? Normalleft ventricular systolic function  Post-operative Echo Findings: ? Noparavalvular leak ? Normalleft ventricular systolic function  ____________________   Echo7/7/21: IMPRESSIONS  1. Day 1 post TAVR with normal transaortic gradients with peak/mean 18/11 mmHg. Trivial  paravalvular leak.  2. Left ventricular ejection fraction, by estimation, is 65 to 70%. The  left ventricle has normal function. The left ventricle has no regional  wall motion abnormalities. Left ventricular diastolic function could not  be evaluated.  3. Right ventricular systolic function is normal. The right ventricular  size is  normal. There is normal pulmonary artery systolic pressure.  4. The mitral valve is normal in structure. Trivial mitral valve  regurgitation. No evidence of mitral stenosis.  5. The aortic valve has been repaired/replaced. Aortic valve  regurgitation is not visualized. No aortic stenosis is present. There is a  26 mm Ultra, stented (TAVR) valve present in the aortic position.  Procedure Date: 09/06/2019. Echo findings are  consistent with normal structure and function of the aortic valve  prosthesis. Aortic valve mean gradient measures 11.0 mmHg.  6. The inferior vena cava is normal in size with greater than 50%  respiratory variability, suggesting right atrial pressure of 3 mmHg.    ____________________  Echo 09/17/19 IMPRESSIONS  1. Left ventricular ejection fraction, by estimation, is 60 to 65%. The  left ventricle has normal function. The left ventricle has no regional  wall motion abnormalities. Left ventricular diastolic parameters are  consistent with Grade I diastolic  dysfunction (impaired relaxation). Elevated left atrial pressure.  2. Right ventricular systolic function is normal. The right ventricular  size is normal. There is normal pulmonary artery systolic pressure.  3. The mitral valve is normal in structure. Trivial mitral valve  regurgitation. No evidence of mitral stenosis.  4. Edwards Sapien 3 THV size 26 mm, model #[id number] is in the AV  position. Mildly elevated mean gradient of 25 mmHg based on published  normals, has signifcantly increased from prior study 09/07/19. The LVOT/ AV  VTI ratio has also decreased. Limited  morphological visualization of prosthetic valve in general with TTE, no  gross visualized thrombus or vegetation. Given presentation with  bacteremia, CVA, and increased gradient across prosthetic valve would  recommend TEE for better visualization. . The  aortic valve has been repaired/replaced. Aortic valve regurgitation is not    visualized. There is a 26 mm Edwards Sapien prosthetic (TAVR) valve  present in the aortic position. Procedure Date: 09/06/2019.  5. The inferior vena cava is normal in size with greater than 50%  respiratory variability, suggesting right atrial pressure of 3 mmHg.  ___________________   Echo 10/19/19 IMPRESSIONS  1. Left ventricular ejection fraction, by estimation, is 60 to 65%. The  left ventricle has normal function. The left ventricle has no regional  wall motion abnormalities. There is mild concentric left ventricular  hypertrophy. Left ventricular diastolic  parameters are consistent with Grade I diastolic dysfunction (impaired  relaxation). Elevated left ventricular end-diastolic pressure.  2. Right ventricular systolic function is normal. The right ventricular  size is normal. There is normal pulmonary artery systolic pressure.  3. The mitral valve is normal in structure. Trivial mitral valve  regurgitation. No evidence of mitral stenosis.  4. Mean gradient 23 mmHg, unchanged from 25 mmHg on 09/2019. The aortic  valve is normal in structure. Aortic valve regurgitation is not  visualized. Moderate aortic valve stenosis. There is a 26 mm Edwards  Sapien prosthetic (TAVR) valve present in the  aortic position. Procedure Date: 09/06/19. Aortic valve area, by VTI  measures 1.01 cm. Aortic valve mean gradient measures 23.0 mmHg. Aortic  valve Vmax measures 3.27 m/s.  5. Aortic dilatation noted. There is mild dilatation of the ascending  aorta.  6. The inferior vena cava is normal in size with greater than 50%  respiratory variability, suggesting right atrial pressure of 3 mmHg.   ASSESSMENT & PLAN:   Acute dyspnea: patient having worsening dyspnea to the point that he cannot walk across the room without being short of breath. He has no s/s CHF. Weight has been stable. Recent blood counts are stable. Kidney function has recently worsened (creat up to 1.6) and lasix  decreased to 20mg  daily. His O2 sats are mildly decreased at 92% today; however, he is not tachycardic and recent echo did not show any RV changes, so doubt pulmonary embolus. He did have a mild fever on Saturday which has now resolved. He is completely vaccinated for covid 19 with no his risk exposures. His echo did show that his aortic gradients were mildly elevated at 23 mmHg (up from 11 mm Hg on POD1 echo). Given recent admission for bacteremia and worsening renal function (creat up to 1.6; GRF 37) will plan to do a TEE to assess valve and rule out leaflet thickening (subacute leaflet thrombosis)/endocarditis.   Medication Adjustments/Labs and Tests Ordered: Current medicines are reviewed at length with the patient today.  Concerns regarding medicines are outlined above.  Medication changes, Labs and Tests ordered today are listed in the Patient Instructions below. Patient Instructions  Medication Instructions:  Your provider recommends that you continue on your current medications as directed. Please refer to the Current Medication list given to you today.   *If you need a refill on your cardiac medications before your next appointment, please call your pharmacy*  Testing/Procedures: Your physician has requested that you have a TEE. During a TEE, sound waves are used to create images of your heart. It provides your doctor with information about the size and shape of your heart and how well your heart's chambers and valves are working. In this test, a transducer is attached to the end of a flexible tube that's guided down your throat and into your esophagus (the tube leading from you mouth to your stomach) to get a more detailed image of your heart. You are not awake for the procedure. Please see the instruction sheet given to you today. For further information please visit [email or web address].  TESTING INFORMATION: COVID SCREENING: You are scheduled for your drive-thru COVID screening on Monday,  8/30 between 9AM and 2PM. Pre-Procedural COVID-19 Testing Site [address] [place], [place] [address] You will need to go home after your screening and quarantine until your procedure.   TEE INFORMATION: You are scheduled for a TEE on Wednesday, September 1 with Dr. Margaretann Loveless.  Please arrive at the [facility] (Main Entrance A) at [facility]: [address] [place], [place] [address] at 10 am (this is one hour prior to your procedure time).   DIET: Nothing to eat or drink after midnight except a sip of water with medications (see medication instructions below)  Medication Instructions: Hold LASIX the morning of your procedure You may take your other medications as directed with sips of water  You must have a responsible person to drive you home and stay in the waiting area during your procedure. Failure to do so could result in cancellation.  Bring your insurance cards.  *Special Note: Every effort is made to have your procedure done on time. Occasionally there are emergencies that occur at the hospital that may cause delays. Please be patient if a delay does occur.      Signed, Angelena Form, PA-C  10/27/2019 9:07 PM    [place], [place],   [address] Phone: [phone]; Fax: [phone]

## 2019-10-27 ENCOUNTER — Inpatient Hospital Stay: Payer: Medicare Other

## 2019-10-27 ENCOUNTER — Other Ambulatory Visit: Payer: Self-pay

## 2019-10-27 ENCOUNTER — Ambulatory Visit (INDEPENDENT_AMBULATORY_CARE_PROVIDER_SITE_OTHER): Payer: Medicare Other | Admitting: Physician Assistant

## 2019-10-27 ENCOUNTER — Ambulatory Visit: Payer: Medicare Other

## 2019-10-27 ENCOUNTER — Encounter: Payer: Self-pay | Admitting: Physician Assistant

## 2019-10-27 VITALS — BP 110/56 | HR 69 | Ht 64.0 in | Wt 162.0 lb

## 2019-10-27 VITALS — BP 115/62 | HR 80 | Temp 97.8°F | Resp 18

## 2019-10-27 DIAGNOSIS — Z952 Presence of prosthetic heart valve: Secondary | ICD-10-CM

## 2019-10-27 DIAGNOSIS — Z87898 Personal history of other specified conditions: Secondary | ICD-10-CM | POA: Diagnosis not present

## 2019-10-27 DIAGNOSIS — I639 Cerebral infarction, unspecified: Secondary | ICD-10-CM

## 2019-10-27 DIAGNOSIS — D693 Immune thrombocytopenic purpura: Secondary | ICD-10-CM

## 2019-10-27 DIAGNOSIS — R0602 Shortness of breath: Secondary | ICD-10-CM | POA: Diagnosis not present

## 2019-10-27 DIAGNOSIS — D696 Thrombocytopenia, unspecified: Secondary | ICD-10-CM | POA: Diagnosis not present

## 2019-10-27 LAB — BASIC METABOLIC PANEL - CANCER CENTER ONLY
Anion gap: 12 (ref 5–15)
BUN: 31 mg/dL — ABNORMAL HIGH (ref 8–23)
CO2: 25 mmol/L (ref 22–32)
Calcium: 9.5 mg/dL (ref 8.9–10.3)
Chloride: 102 mmol/L (ref 98–111)
Creatinine: 1.6 mg/dL — ABNORMAL HIGH (ref 0.61–1.24)
GFR, Est AFR Am: 42 mL/min — ABNORMAL LOW (ref >60)
GFR, Estimated: 37 mL/min — ABNORMAL LOW (ref >60)
Glucose, Bld: 190 mg/dL — ABNORMAL HIGH (ref 70–99)
Potassium: 3.9 mmol/L (ref 3.5–5.1)
Sodium: 139 mmol/L (ref 135–145)

## 2019-10-27 LAB — CBC WITH DIFFERENTIAL (CANCER CENTER ONLY)
Abs Immature Granulocytes: 1.17 10*3/uL — ABNORMAL HIGH (ref 0.00–0.07)
Basophils Absolute: 0.2 10*3/uL — ABNORMAL HIGH (ref 0.0–0.1)
Basophils Relative: 1 %
Eosinophils Absolute: 0.4 10*3/uL (ref 0.0–0.5)
Eosinophils Relative: 3 %
HCT: 35.6 % — ABNORMAL LOW (ref 39.0–52.0)
Hemoglobin: 11.3 g/dL — ABNORMAL LOW (ref 13.0–17.0)
Immature Granulocytes: 7 %
Lymphocytes Relative: 7 %
Lymphs Abs: 1 10*3/uL (ref 0.7–4.0)
MCH: 25.6 pg — ABNORMAL LOW (ref 26.0–34.0)
MCHC: 31.7 g/dL (ref 30.0–36.0)
MCV: 80.5 fL (ref 80.0–100.0)
Monocytes Absolute: 3.3 10*3/uL — ABNORMAL HIGH (ref 0.1–1.0)
Monocytes Relative: 21 %
Neutro Abs: 9.9 10*3/uL — ABNORMAL HIGH (ref 1.7–7.7)
Neutrophils Relative %: 61 %
Platelet Count: 211 10*3/uL (ref 150–400)
RBC: 4.42 MIL/uL (ref 4.22–5.81)
RDW: 20 % — ABNORMAL HIGH (ref 11.5–15.5)
WBC Count: 16 10*3/uL — ABNORMAL HIGH (ref 4.0–10.5)
nRBC: 0 % (ref 0.0–0.2)

## 2019-10-27 MED ORDER — ROMIPLOSTIM 125 MCG ~~LOC~~ SOLR
0.5000 ug/kg | Freq: Once | SUBCUTANEOUS | Status: AC
  Administered 2019-10-27: 35 ug via SUBCUTANEOUS
  Filled 2019-10-27: qty 0.07

## 2019-10-27 NOTE — Progress Notes (Signed)
Pltc = 211 today Dr. Benay Spice advised Nplate 0.5 mcg/kg today.  Prednisone tapering.  Kennith Center, Pharm.D., CPP 10/27/2019@10 :20 AM

## 2019-10-27 NOTE — Patient Instructions (Addendum)
Medication Instructions:  Your provider recommends that you continue on your current medications as directed. Please refer to the Current Medication list given to you today.   *If you need a refill on your cardiac medications before your next appointment, please call your pharmacy*  Testing/Procedures: Your physician has requested that you have a TEE. During a TEE, sound waves are used to create images of your heart. It provides your doctor with information about the size and shape of your heart and how well your hearts chambers and valves are working. In this test, a transducer is attached to the end of a flexible tube thats guided down your throat and into your esophagus (the tube leading from you mouth to your stomach) to get a more detailed image of your heart. You are not awake for the procedure. Please see the instruction sheet given to you today. For further information please visit [email or web address].  TESTING INFORMATION: COVID SCREENING: You are scheduled for your drive-thru COVID screening on Monday, 8/30 between 9AM and 2PM. Pre-Procedural COVID-19 Testing Site [address] [place], [place] [address] You will need to go home after your screening and quarantine until your procedure.   TEE INFORMATION: You are scheduled for a TEE on Wednesday, September 1 with Dr. Margaretann Loveless.  Please arrive at the [facility] (Main Entrance A) at [facility]: [address] [place], [place] [address] at 10 am (this is one hour prior to your procedure time).   DIET: Nothing to eat or drink after midnight except a sip of water with medications (see medication instructions below)  Medication Instructions: Hold LASIX the morning of your procedure You may take your other medications as directed with sips of water  You must have a responsible person to drive you home and stay in the waiting area during your procedure. Failure to do so could result in cancellation.  Bring your insurance  cards.  *Special Note: Every effort is made to have your procedure done on time. Occasionally there are emergencies that occur at the hospital that may cause delays. Please be patient if a delay does occur.

## 2019-10-27 NOTE — Patient Instructions (Signed)
Romiplostim injection What is this medicine? ROMIPLOSTIM (roe mi PLOE stim) helps your body make more platelets. This medicine is used to treat low platelets caused by chronic idiopathic thrombocytopenic purpura (ITP). This medicine may be used for other purposes; ask your health care provider or pharmacist if you have questions. COMMON BRAND NAME(S): Nplate What should I tell my health care provider before I take this medicine? They need to know if you have any of these conditions:  bleeding disorders  bone marrow problem, like blood cancer or myelodysplastic syndrome  history of blood clots  liver disease  surgery to remove your spleen  an unusual or allergic reaction to romiplostim, mannitol, other medicines, foods, dyes, or preservatives  pregnant or trying to get pregnant  breast-feeding How should I use this medicine? This medicine is for injection under the skin. It is given by a health care professional in a hospital or clinic setting. A special MedGuide will be given to you before your injection. Read this information carefully each time. Talk to your pediatrician regarding the use of this medicine in children. While this drug may be prescribed for children as young as 1 year for selected conditions, precautions do apply. Overdosage: If you think you have taken too much of this medicine contact a poison control center or emergency room at once. NOTE: This medicine is only for you. Do not share this medicine with others. What if I miss a dose? It is important not to miss your dose. Call your doctor or health care professional if you are unable to keep an appointment. What may interact with this medicine? Interactions are not expected. This list may not describe all possible interactions. Give your health care provider a list of all the medicines, herbs, non-prescription drugs, or dietary supplements you use. Also tell them if you smoke, drink alcohol, or use illegal drugs.  Some items may interact with your medicine. What should I watch for while using this medicine? Your condition will be monitored carefully while you are receiving this medicine. Visit your prescriber or health care professional for regular checks on your progress and for the needed blood tests. It is important to keep all appointments. What side effects may I notice from receiving this medicine? Side effects that you should report to your doctor or health care professional as soon as possible:  allergic reactions like skin rash, itching or hives, swelling of the face, lips, or tongue  signs and symptoms of bleeding such as bloody or black, tarry stools; red or dark brown urine; spitting up blood or brown material that looks like coffee grounds; red spots on the skin; unusual bruising or bleeding from the eyes, gums, or nose  signs and symptoms of a blood clot such as chest pain; shortness of breath; pain, swelling, or warmth in the leg  signs and symptoms of a stroke like changes in vision; confusion; trouble speaking or understanding; severe headaches; sudden numbness or weakness of the face, arm or leg; trouble walking; dizziness; loss of balance or coordination Side effects that usually do not require medical attention (report to your doctor or health care professional if they continue or are bothersome):  headache  pain in arms and legs  pain in mouth  stomach pain This list may not describe all possible side effects. Call your doctor for medical advice about side effects. You may report side effects to FDA at 1-800-FDA-1088. Where should I keep my medicine? This drug is given in a hospital or clinic   and will not be stored at home. NOTE: This sheet is a summary. It may not cover all possible information. If you have questions about this medicine, talk to your doctor, pharmacist, or health care provider.  2020 Elsevier/Gold Standard (2017-02-16 11:10:55)  

## 2019-10-27 NOTE — Progress Notes (Signed)
TEE scheduled 9/1 with Dr. Margaretann Loveless. Case QJ#[id number]

## 2019-10-31 ENCOUNTER — Other Ambulatory Visit: Payer: Medicare Other | Admitting: *Deleted

## 2019-10-31 ENCOUNTER — Other Ambulatory Visit (HOSPITAL_COMMUNITY)
Admission: RE | Admit: 2019-10-31 | Discharge: 2019-10-31 | Disposition: A | Discharge status: Home / Self Care | Payer: Medicare Other | Source: Ambulatory Visit | Attending: Internal Medicine | Admitting: Internal Medicine

## 2019-10-31 ENCOUNTER — Other Ambulatory Visit: Payer: Self-pay

## 2019-10-31 ENCOUNTER — Other Ambulatory Visit: Payer: Self-pay | Admitting: Physician Assistant

## 2019-10-31 DIAGNOSIS — N179 Acute kidney failure, unspecified: Secondary | ICD-10-CM

## 2019-10-31 DIAGNOSIS — Z20822 Contact with and (suspected) exposure to covid-19: Secondary | ICD-10-CM | POA: Insufficient documentation

## 2019-10-31 DIAGNOSIS — Z01812 Encounter for preprocedural laboratory examination: Secondary | ICD-10-CM | POA: Insufficient documentation

## 2019-10-31 LAB — SARS CORONAVIRUS 2 (TAT 6-24 HRS): SARS Coronavirus 2: NEGATIVE

## 2019-10-31 NOTE — Progress Notes (Signed)
b

## 2019-11-01 ENCOUNTER — Other Ambulatory Visit: Payer: Self-pay | Admitting: Physician Assistant

## 2019-11-01 DIAGNOSIS — R0602 Shortness of breath: Secondary | ICD-10-CM

## 2019-11-01 DIAGNOSIS — Z87898 Personal history of other specified conditions: Secondary | ICD-10-CM

## 2019-11-01 DIAGNOSIS — Z952 Presence of prosthetic heart valve: Secondary | ICD-10-CM

## 2019-11-01 LAB — BASIC METABOLIC PANEL
BUN/Creatinine Ratio: 19 (ref 10–24)
BUN: 23 mg/dL (ref 10–36)
CO2: 26 mmol/L (ref 20–29)
Calcium: 9.4 mg/dL (ref 8.6–10.2)
Chloride: 100 mmol/L (ref 96–106)
Creatinine, Ser: 1.21 mg/dL (ref 0.76–1.27)
GFR calc Af Amer: 59 mL/min/{1.73_m2} — ABNORMAL LOW (ref >59)
GFR calc non Af Amer: 51 mL/min/{1.73_m2} — ABNORMAL LOW (ref >59)
Glucose: 126 mg/dL — ABNORMAL HIGH (ref 65–99)
Potassium: 4.3 mmol/L (ref 3.5–5.2)
Sodium: 136 mmol/L (ref 134–144)

## 2019-11-02 ENCOUNTER — Ambulatory Visit (HOSPITAL_COMMUNITY): Payer: Medicare Other | Admitting: Certified Registered Nurse Anesthetist

## 2019-11-02 ENCOUNTER — Encounter (HOSPITAL_COMMUNITY)
Admission: RE | Disposition: A | Discharge status: Home / Self Care | Payer: Self-pay | Source: Home / Self Care | Attending: Internal Medicine

## 2019-11-02 ENCOUNTER — Ambulatory Visit (HOSPITAL_BASED_OUTPATIENT_CLINIC_OR_DEPARTMENT_OTHER)
Admission: RE | Admit: 2019-11-02 | Discharge: 2019-11-02 | Disposition: A | Discharge status: Home / Self Care | Payer: Medicare Other | Source: Ambulatory Visit | Attending: Physician Assistant | Admitting: Physician Assistant

## 2019-11-02 ENCOUNTER — Other Ambulatory Visit: Payer: Self-pay

## 2019-11-02 ENCOUNTER — Ambulatory Visit: Payer: Medicare Other

## 2019-11-02 ENCOUNTER — Ambulatory Visit (HOSPITAL_COMMUNITY)
Admission: RE | Admit: 2019-11-02 | Discharge: 2019-11-02 | Disposition: A | Discharge status: Home / Self Care | Payer: Medicare Other | Attending: Internal Medicine | Admitting: Internal Medicine

## 2019-11-02 ENCOUNTER — Telehealth: Payer: Medicare Other | Admitting: Internal Medicine

## 2019-11-02 ENCOUNTER — Encounter (HOSPITAL_COMMUNITY): Payer: Self-pay | Admitting: Internal Medicine

## 2019-11-02 ENCOUNTER — Other Ambulatory Visit: Payer: Medicare Other

## 2019-11-02 DIAGNOSIS — K219 Gastro-esophageal reflux disease without esophagitis: Secondary | ICD-10-CM | POA: Diagnosis not present

## 2019-11-02 DIAGNOSIS — Z85828 Personal history of other malignant neoplasm of skin: Secondary | ICD-10-CM | POA: Diagnosis not present

## 2019-11-02 DIAGNOSIS — F419 Anxiety disorder, unspecified: Secondary | ICD-10-CM | POA: Diagnosis not present

## 2019-11-02 DIAGNOSIS — Z87891 Personal history of nicotine dependence: Secondary | ICD-10-CM | POA: Insufficient documentation

## 2019-11-02 DIAGNOSIS — Z9081 Acquired absence of spleen: Secondary | ICD-10-CM | POA: Insufficient documentation

## 2019-11-02 DIAGNOSIS — Z888 Allergy status to other drugs, medicaments and biological substances status: Secondary | ICD-10-CM | POA: Diagnosis not present

## 2019-11-02 DIAGNOSIS — Z7982 Long term (current) use of aspirin: Secondary | ICD-10-CM | POA: Diagnosis not present

## 2019-11-02 DIAGNOSIS — Z87898 Personal history of other specified conditions: Secondary | ICD-10-CM

## 2019-11-02 DIAGNOSIS — D693 Immune thrombocytopenic purpura: Secondary | ICD-10-CM | POA: Insufficient documentation

## 2019-11-02 DIAGNOSIS — R0602 Shortness of breath: Secondary | ICD-10-CM

## 2019-11-02 DIAGNOSIS — Z8042 Family history of malignant neoplasm of prostate: Secondary | ICD-10-CM | POA: Insufficient documentation

## 2019-11-02 DIAGNOSIS — Z8249 Family history of ischemic heart disease and other diseases of the circulatory system: Secondary | ICD-10-CM | POA: Insufficient documentation

## 2019-11-02 DIAGNOSIS — I088 Other rheumatic multiple valve diseases: Secondary | ICD-10-CM | POA: Insufficient documentation

## 2019-11-02 DIAGNOSIS — I340 Nonrheumatic mitral (valve) insufficiency: Secondary | ICD-10-CM | POA: Diagnosis not present

## 2019-11-02 DIAGNOSIS — I420 Dilated cardiomyopathy: Secondary | ICD-10-CM | POA: Insufficient documentation

## 2019-11-02 DIAGNOSIS — I480 Paroxysmal atrial fibrillation: Secondary | ICD-10-CM | POA: Insufficient documentation

## 2019-11-02 DIAGNOSIS — Z952 Presence of prosthetic heart valve: Secondary | ICD-10-CM

## 2019-11-02 DIAGNOSIS — C9140 Hairy cell leukemia not having achieved remission: Secondary | ICD-10-CM | POA: Diagnosis not present

## 2019-11-02 DIAGNOSIS — Z8619 Personal history of other infectious and parasitic diseases: Secondary | ICD-10-CM | POA: Insufficient documentation

## 2019-11-02 DIAGNOSIS — E785 Hyperlipidemia, unspecified: Secondary | ICD-10-CM | POA: Insufficient documentation

## 2019-11-02 DIAGNOSIS — I10 Essential (primary) hypertension: Secondary | ICD-10-CM | POA: Insufficient documentation

## 2019-11-02 DIAGNOSIS — Z7952 Long term (current) use of systemic steroids: Secondary | ICD-10-CM | POA: Insufficient documentation

## 2019-11-02 DIAGNOSIS — Z882 Allergy status to sulfonamides status: Secondary | ICD-10-CM | POA: Diagnosis not present

## 2019-11-02 DIAGNOSIS — M1990 Unspecified osteoarthritis, unspecified site: Secondary | ICD-10-CM | POA: Diagnosis not present

## 2019-11-02 DIAGNOSIS — H409 Unspecified glaucoma: Secondary | ICD-10-CM | POA: Diagnosis not present

## 2019-11-02 DIAGNOSIS — Z823 Family history of stroke: Secondary | ICD-10-CM | POA: Insufficient documentation

## 2019-11-02 DIAGNOSIS — Z8673 Personal history of transient ischemic attack (TIA), and cerebral infarction without residual deficits: Secondary | ICD-10-CM | POA: Diagnosis not present

## 2019-11-02 DIAGNOSIS — Z85038 Personal history of other malignant neoplasm of large intestine: Secondary | ICD-10-CM | POA: Insufficient documentation

## 2019-11-02 DIAGNOSIS — I2510 Atherosclerotic heart disease of native coronary artery without angina pectoris: Secondary | ICD-10-CM | POA: Diagnosis not present

## 2019-11-02 DIAGNOSIS — Z79899 Other long term (current) drug therapy: Secondary | ICD-10-CM | POA: Diagnosis not present

## 2019-11-02 DIAGNOSIS — Z955 Presence of coronary angioplasty implant and graft: Secondary | ICD-10-CM | POA: Insufficient documentation

## 2019-11-02 HISTORY — PX: TEE WITHOUT CARDIOVERSION: SHX5443

## 2019-11-02 HISTORY — PX: BUBBLE STUDY: SHX6837

## 2019-11-02 LAB — ECHO TEE
AR max vel: 1.73 cm2
AV Area VTI: 1.45 cm2
AV Area mean vel: 1.76 cm2
AV Mean grad: 19 mmHg
AV Peak grad: 24.3 mmHg
Ao pk vel: 2.47 m/s

## 2019-11-02 SURGERY — ECHOCARDIOGRAM, TRANSESOPHAGEAL
Anesthesia: Monitor Anesthesia Care

## 2019-11-02 MED ORDER — SODIUM CHLORIDE 0.9 % IV SOLN: INTRAVENOUS | Status: DC

## 2019-11-02 MED ORDER — BUTAMBEN-TETRACAINE-BENZOCAINE 2-2-14 % EX AERO
INHALATION_SPRAY | CUTANEOUS | Status: DC | PRN
  Administered 2019-11-02: 2 via TOPICAL

## 2019-11-02 MED ORDER — PROPOFOL 500 MG/50ML IV EMUL
INTRAVENOUS | Status: DC | PRN
  Administered 2019-11-02: 50 ug/kg/min via INTRAVENOUS

## 2019-11-02 MED ORDER — EPHEDRINE SULFATE-NACL 50-0.9 MG/10ML-% IV SOSY
PREFILLED_SYRINGE | INTRAVENOUS | Status: DC | PRN
  Administered 2019-11-02: 5 mg via INTRAVENOUS
  Administered 2019-11-02: 10 mg via INTRAVENOUS
  Administered 2019-11-02: 5 mg via INTRAVENOUS
  Administered 2019-11-02: 10 mg via INTRAVENOUS

## 2019-11-02 MED ORDER — PROPOFOL 10 MG/ML IV BOLUS
INTRAVENOUS | Status: DC | PRN
  Administered 2019-11-02: 10 mg via INTRAVENOUS
  Administered 2019-11-02: 15 mg via INTRAVENOUS

## 2019-11-02 MED ORDER — LIDOCAINE 2% (20 MG/ML) 5 ML SYRINGE
INTRAMUSCULAR | Status: DC | PRN
  Administered 2019-11-02: 40 mg via INTRAVENOUS

## 2019-11-02 MED ORDER — PHENYLEPHRINE 40 MCG/ML (10ML) SYRINGE FOR IV PUSH (FOR BLOOD PRESSURE SUPPORT)
PREFILLED_SYRINGE | INTRAVENOUS | Status: DC | PRN
  Administered 2019-11-02 (×2): 80 ug via INTRAVENOUS

## 2019-11-02 NOTE — Anesthesia Preprocedure Evaluation (Addendum)
Anesthesia Evaluation  Patient identified by MRN, date of birth, ID band Patient awake    Reviewed: Allergy & Precautions, Patient's Chart, lab work & pertinent test results  History of Anesthesia Complications (+) AWARENESS UNDER ANESTHESIA  Airway Mallampati: II  TM Distance: >3 FB     Dental   Pulmonary pneumonia, former smoker,    breath sounds clear to auscultation       Cardiovascular hypertension, + CAD  + dysrhythmias + Valvular Problems/Murmurs  Rhythm:Irregular Rate:Normal     Neuro/Psych    GI/Hepatic Neg liver ROS, GERD  ,  Endo/Other  negative endocrine ROS  Renal/GU negative Renal ROS     Musculoskeletal   Abdominal   Peds  Hematology  (+) anemia ,   Anesthesia Other Findings   Reproductive/Obstetrics                            Anesthesia Physical Anesthesia Plan  ASA: IV  Anesthesia Plan: MAC   Post-op Pain Management:    Induction: Intravenous  PONV Risk Score and Plan: 1 and Propofol infusion  Airway Management Planned: Nasal Cannula and Simple Face Mask  Additional Equipment:   Intra-op Plan:   Post-operative Plan:   Informed Consent: I have reviewed the patients History and Physical, chart, labs and discussed the procedure including the risks, benefits and alternatives for the proposed anesthesia with the patient or authorized representative who has indicated his/her understanding and acceptance.     Dental advisory given  Plan Discussed with: CRNA  Anesthesia Plan Comments:        Anesthesia Quick Evaluation

## 2019-11-02 NOTE — Anesthesia Procedure Notes (Signed)
Procedure Name: MAC Date/Time: 11/02/2019 11:12 AM Performed by: Colin Benton, CRNA Pre-anesthesia Checklist: Patient identified, Emergency Drugs available, Suction available and Patient being monitored Patient Re-evaluated:Patient Re-evaluated prior to induction Oxygen Delivery Method: Nasal cannula Induction Type: IV induction Placement Confirmation: positive ETCO2 Dental Injury: Teeth and Oropharynx as per pre-operative assessment

## 2019-11-02 NOTE — CV Procedure (Addendum)
INDICATIONS: SOB, s/p TAVR  PROCEDURE:   Informed consent was obtained prior to the procedure. The risks, benefits and alternatives for the procedure were discussed and the patient comprehended these risks.  Risks include, but are not limited to, cough, sore throat, vomiting, nausea, somnolence, esophageal and stomach trauma or perforation, bleeding, low blood pressure, aspiration, pneumonia, infection, trauma to the teeth and death.    After a procedural time-out, the oropharynx was anesthetized with 20% benzocaine spray.   During this procedure the patient was administered propofol per anesthesia.  The patient's heart rate, blood pressure, and oxygen saturation were monitored continuously during the procedure. The period of conscious sedation was 45 minutes, of which I was present face-to-face 100% of this time.  The transesophageal probe was inserted in the esophagus and stomach without difficulty and multiple views were obtained.  The patient was kept under observation until the patient left the procedure room.  The patient left the procedure room in stable condition.   Agitated microbubble saline contrast was administered.  COMPLICATIONS:    There were no immediate complications.  FINDINGS:   FORMAL ECHOCARDIOGRAM REPORT PENDING  EF 60% Normal appearing TAVR prosthesis with trace central AR. Due to patient factors, maximal gradient unable to be obtained with TEE. Transthoracic images obtained after TEE for confirmation.   Severely thickened and degenerative mitral valve leaflets. Mitral valve gradient 4 mmHg at HR 59 bpm. At least mild-moderate MR. Possible tiny vegetation vs degenerative strand.   Thickened pulmonary valve with at least mild-moderate PR.   Formal quantitation of gradients pending in Echo report.   RECOMMENDATIONS:    D/c when stable.   Time Spent Directly with the Patient:  60 minutes   [name] A Margaretann Loveless 11/02/2019, 12:10 PM

## 2019-11-02 NOTE — Progress Notes (Signed)
Echocardiogram Echocardiogram Transesophageal has been performed.  [name] 11/02/2019, 12:20 PM

## 2019-11-02 NOTE — Transfer of Care (Signed)
Immediate Anesthesia Transfer of Care Note  Patient: BARETT WHIDBEE  Procedure(s) Performed: TRANSESOPHAGEAL ECHOCARDIOGRAM (TEE) (N/A ) BUBBLE STUDY  Patient Location: Endoscopy Unit  Anesthesia Type:MAC  Level of Consciousness: drowsy  Airway & Oxygen Therapy: Patient Spontanous Breathing and Patient connected to nasal cannula oxygen  Post-op Assessment: Report given to RN and Post -op Vital signs reviewed and stable  Post vital signs: Reviewed  Last Vitals:  Vitals Value Taken Time  BP 144/55 11/02/19 1212  Temp    Pulse 60 11/02/19 1213  Resp 17 11/02/19 1213  SpO2 99 % 11/02/19 1213  Vitals shown include unvalidated device data.  Last Pain:  Vitals:   11/02/19 1026  TempSrc: Oral  PainSc: 0-No pain         Complications: No complications documented.

## 2019-11-02 NOTE — Interval H&P Note (Signed)
History and Physical Interval Note:  11/02/2019 11:04 AM  [name]  has presented today for surgery, with the diagnosis of SHORTNESS OF BREATH STATUS POST TAVR.  The various methods of treatment have been discussed with the patient and family. After consideration of risks, benefits and other options for treatment, the patient has consented to  Procedure(s): TRANSESOPHAGEAL ECHOCARDIOGRAM (TEE) (N/A) as a surgical intervention.  The patient's history has been reviewed, patient examined, no change in status, stable for surgery.  I have reviewed the patient's chart and labs.  Questions were answered to the patient's satisfaction.     Elouise Munroe

## 2019-11-02 NOTE — Anesthesia Postprocedure Evaluation (Signed)
Anesthesia Post Note  Patient: [name]  Procedure(s) Performed: TRANSESOPHAGEAL ECHOCARDIOGRAM (TEE) (N/A ) BUBBLE STUDY     Patient location during evaluation: Endoscopy Anesthesia Type: MAC Level of consciousness: awake Pain management: pain level controlled Vital Signs Assessment: post-procedure vital signs reviewed and stable Respiratory status: spontaneous breathing Cardiovascular status: stable Postop Assessment: no apparent nausea or vomiting Anesthetic complications: no   No complications documented.  Last Vitals:  Vitals:   11/02/19 1242 11/02/19 1252  BP: (!) 147/61 (!) 156/63  Pulse: (!) 58 (!) 58  Resp: (!) 22 20  Temp:    SpO2: 91% 93%    Last Pain:  Vitals:   11/02/19 1252  TempSrc:   PainSc: 0-No pain                 Kamia Insalaco

## 2019-11-02 NOTE — Discharge Instructions (Signed)

## 2019-11-04 ENCOUNTER — Other Ambulatory Visit: Payer: Self-pay

## 2019-11-04 ENCOUNTER — Inpatient Hospital Stay: Payer: Medicare Other | Attending: Oncology

## 2019-11-04 ENCOUNTER — Inpatient Hospital Stay: Payer: Medicare Other

## 2019-11-04 ENCOUNTER — Telehealth (HOSPITAL_COMMUNITY): Payer: Self-pay

## 2019-11-04 VITALS — BP 116/65 | HR 73 | Resp 18

## 2019-11-04 DIAGNOSIS — D696 Thrombocytopenia, unspecified: Secondary | ICD-10-CM | POA: Insufficient documentation

## 2019-11-04 DIAGNOSIS — D693 Immune thrombocytopenic purpura: Secondary | ICD-10-CM

## 2019-11-04 DIAGNOSIS — I480 Paroxysmal atrial fibrillation: Secondary | ICD-10-CM | POA: Diagnosis not present

## 2019-11-04 DIAGNOSIS — Z23 Encounter for immunization: Secondary | ICD-10-CM | POA: Diagnosis not present

## 2019-11-04 DIAGNOSIS — Z452 Encounter for adjustment and management of vascular access device: Secondary | ICD-10-CM | POA: Diagnosis not present

## 2019-11-04 DIAGNOSIS — I110 Hypertensive heart disease with heart failure: Secondary | ICD-10-CM | POA: Diagnosis not present

## 2019-11-04 DIAGNOSIS — N390 Urinary tract infection, site not specified: Secondary | ICD-10-CM | POA: Diagnosis not present

## 2019-11-04 DIAGNOSIS — I2510 Atherosclerotic heart disease of native coronary artery without angina pectoris: Secondary | ICD-10-CM | POA: Diagnosis not present

## 2019-11-04 DIAGNOSIS — B965 Pseudomonas (aeruginosa) (mallei) (pseudomallei) as the cause of diseases classified elsewhere: Secondary | ICD-10-CM | POA: Diagnosis not present

## 2019-11-04 LAB — CBC WITH DIFFERENTIAL (CANCER CENTER ONLY)
Abs Immature Granulocytes: 1.32 10*3/uL — ABNORMAL HIGH (ref 0.00–0.07)
Basophils Absolute: 0.3 10*3/uL — ABNORMAL HIGH (ref 0.0–0.1)
Basophils Relative: 2 %
Eosinophils Absolute: 0.5 10*3/uL (ref 0.0–0.5)
Eosinophils Relative: 3 %
HCT: 35.3 % — ABNORMAL LOW (ref 39.0–52.0)
Hemoglobin: 11.3 g/dL — ABNORMAL LOW (ref 13.0–17.0)
Immature Granulocytes: 9 %
Lymphocytes Relative: 8 %
Lymphs Abs: 1.2 10*3/uL (ref 0.7–4.0)
MCH: 25.3 pg — ABNORMAL LOW (ref 26.0–34.0)
MCHC: 32 g/dL (ref 30.0–36.0)
MCV: 79 fL — ABNORMAL LOW (ref 80.0–100.0)
Monocytes Absolute: 2.8 10*3/uL — ABNORMAL HIGH (ref 0.1–1.0)
Monocytes Relative: 19 %
Neutro Abs: 8.5 10*3/uL — ABNORMAL HIGH (ref 1.7–7.7)
Neutrophils Relative %: 59 %
Platelet Count: 265 10*3/uL (ref 150–400)
RBC: 4.47 MIL/uL (ref 4.22–5.81)
RDW: 19.4 % — ABNORMAL HIGH (ref 11.5–15.5)
WBC Count: 14.7 10*3/uL — ABNORMAL HIGH (ref 4.0–10.5)
nRBC: 0.1 % (ref 0.0–0.2)

## 2019-11-04 MED ORDER — ROMIPLOSTIM 125 MCG ~~LOC~~ SOLR
35.0000 ug | Freq: Once | SUBCUTANEOUS | Status: AC
  Administered 2019-11-04: 35 ug via SUBCUTANEOUS
  Filled 2019-11-04: qty 0.07

## 2019-11-04 NOTE — Telephone Encounter (Signed)
Called patient to see if he was interested in participating in the Cardiac Rehab Program. Patient stated yes. Patient will come in for orientation on 12/20/19 @ 9AM and will attend the 915AM exercise class. Went over insurance, patient verbalized understanding.   Mailed letter

## 2019-11-04 NOTE — Patient Instructions (Signed)
Romiplostim injection What is this medicine? ROMIPLOSTIM (roe mi PLOE stim) helps your body make more platelets. This medicine is used to treat low platelets caused by chronic idiopathic thrombocytopenic purpura (ITP). This medicine may be used for other purposes; ask your health care provider or pharmacist if you have questions. COMMON BRAND NAME(S): Nplate What should I tell my health care provider before I take this medicine? They need to know if you have any of these conditions:  bleeding disorders  bone marrow problem, like blood cancer or myelodysplastic syndrome  history of blood clots  liver disease  surgery to remove your spleen  an unusual or allergic reaction to romiplostim, mannitol, other medicines, foods, dyes, or preservatives  pregnant or trying to get pregnant  breast-feeding How should I use this medicine? This medicine is for injection under the skin. It is given by a health care professional in a hospital or clinic setting. A special MedGuide will be given to you before your injection. Read this information carefully each time. Talk to your pediatrician regarding the use of this medicine in children. While this drug may be prescribed for children as young as 1 year for selected conditions, precautions do apply. Overdosage: If you think you have taken too much of this medicine contact a poison control center or emergency room at once. NOTE: This medicine is only for you. Do not share this medicine with others. What if I miss a dose? It is important not to miss your dose. Call your doctor or health care professional if you are unable to keep an appointment. What may interact with this medicine? Interactions are not expected. This list may not describe all possible interactions. Give your health care provider a list of all the medicines, herbs, non-prescription drugs, or dietary supplements you use. Also tell them if you smoke, drink alcohol, or use illegal drugs.  Some items may interact with your medicine. What should I watch for while using this medicine? Your condition will be monitored carefully while you are receiving this medicine. Visit your prescriber or health care professional for regular checks on your progress and for the needed blood tests. It is important to keep all appointments. What side effects may I notice from receiving this medicine? Side effects that you should report to your doctor or health care professional as soon as possible:  allergic reactions like skin rash, itching or hives, swelling of the face, lips, or tongue  signs and symptoms of bleeding such as bloody or black, tarry stools; red or dark brown urine; spitting up blood or brown material that looks like coffee grounds; red spots on the skin; unusual bruising or bleeding from the eyes, gums, or nose  signs and symptoms of a blood clot such as chest pain; shortness of breath; pain, swelling, or warmth in the leg  signs and symptoms of a stroke like changes in vision; confusion; trouble speaking or understanding; severe headaches; sudden numbness or weakness of the face, arm or leg; trouble walking; dizziness; loss of balance or coordination Side effects that usually do not require medical attention (report to your doctor or health care professional if they continue or are bothersome):  headache  pain in arms and legs  pain in mouth  stomach pain This list may not describe all possible side effects. Call your doctor for medical advice about side effects. You may report side effects to FDA at 1-800-FDA-1088. Where should I keep my medicine? This drug is given in a hospital or clinic   and will not be stored at home. NOTE: This sheet is a summary. It may not cover all possible information. If you have questions about this medicine, talk to your doctor, pharmacist, or health care provider.  2020 Elsevier/Gold Standard (2017-02-16 11:10:55)  

## 2019-11-06 ENCOUNTER — Encounter (HOSPITAL_COMMUNITY): Payer: Self-pay | Admitting: Internal Medicine

## 2019-11-08 ENCOUNTER — Other Ambulatory Visit: Payer: Self-pay

## 2019-11-08 ENCOUNTER — Telehealth: Payer: Self-pay

## 2019-11-08 ENCOUNTER — Telehealth (INDEPENDENT_AMBULATORY_CARE_PROVIDER_SITE_OTHER): Payer: Medicare Other | Admitting: Internal Medicine

## 2019-11-08 ENCOUNTER — Telehealth: Payer: Self-pay | Admitting: Physician Assistant

## 2019-11-08 DIAGNOSIS — A498 Other bacterial infections of unspecified site: Secondary | ICD-10-CM | POA: Diagnosis not present

## 2019-11-08 NOTE — Telephone Encounter (Signed)
-----   Message from Eileen Stanford, PA-C sent at 11/08/2019  7:25 AM EDT ----- Regarding: FW: med rec Can you call this pt and let them know that a pharmacist reviewed his med list and his lucentis does not interact with any of his medications. Thank you! KT  ----- Message ----- From: Ramond Dial, RPH-CPP Sent: 11/08/2019   7:16 AM EDT To: Eileen Stanford, PA-C Subject: RE: med rec                                    Sorry for the delay!  No interactions with his Lucentis and his other medications!  Melissa ----- Message ----- From: Eileen Stanford, PA-C Sent: 11/05/2019  11:58 AM EDT To: Ramond Dial, RPH-CPP Subject: med rec                                        Will you look at his meds. He is very concerned about some shot he gets called Lucinta? They want to make sure there are no potential interactions. I pormised I would have a RPH look :)   KT

## 2019-11-08 NOTE — Telephone Encounter (Signed)
Informed patient's wife (DPR) and it is fine to take Lucentis with his meds. She was grateful for follow-up.

## 2019-11-08 NOTE — Telephone Encounter (Signed)
  [facility]   MULTIDISCIPLINARY HEART VALVE TEAM   We reviewed [name] TEE from 9/1. The AV looks to be functioning  normally with a mean gradient of 19 mm hg. There was a small area on the mitral valve felt to be most consistent with a degenerative strand. If he continues to have issues with dyspnea we can plan to do a CT cardiac to look for leaflet thrombosis.    Angelena Form PA-C  [facility]

## 2019-11-08 NOTE — Progress Notes (Signed)
Virtual Visit via Telephone Note  I connected with [name] on 11/08/19 at  2:00 PM EDT by telephone and verified that I am speaking with the correct person using two identifiers.  Location: Patient: Home Provider: [facility]   I discussed the limitations, risks, security and privacy concerns of performing an evaluation and management service by telephone and the availability of in person appointments. I also discussed with the patient that there may be a patient responsible charge related to this service. The patient expressed understanding and agreed to proceed.   History of Present Illness: I called and spoke with [name] and his wife, [name], by phone today.  He was hospitalized in July with Pseudomonas urinary tract infection and bacteremia.  He completed 26 days of IV cefepime on 10/12/2019.  He has not had any further fever or dysuria.  He is slowly feeling better.  He underwent TEE last week which showed a normal TAVR.  There was a question of a degenerative strand versus a tiny vegetation on the mitral valve.   Observations/Objective:   Assessment and Plan: His urinary tract infection and bacteremia has been cured.  I think it is extremely unlikely that that tiny finding on the mitral valve represents active Pseudomonas endocarditis.  Follow Up Instructions: Continue observation off of antibiotics Follow-up here as needed   I discussed the assessment and treatment plan with the patient. The patient was provided an opportunity to ask questions and all were answered. The patient agreed with the plan and demonstrated an understanding of the instructions.   The patient was advised to call back or seek an in-person evaluation if the symptoms worsen or if the condition fails to improve as anticipated.  I provided 15 minutes of non-face-to-face time during this encounter.   Michel Bickers, MD

## 2019-11-10 ENCOUNTER — Other Ambulatory Visit: Payer: Self-pay | Admitting: *Deleted

## 2019-11-10 ENCOUNTER — Ambulatory Visit: Payer: Medicare Other | Admitting: Internal Medicine

## 2019-11-10 DIAGNOSIS — R972 Elevated prostate specific antigen [PSA]: Secondary | ICD-10-CM | POA: Diagnosis not present

## 2019-11-10 DIAGNOSIS — N401 Enlarged prostate with lower urinary tract symptoms: Secondary | ICD-10-CM | POA: Diagnosis not present

## 2019-11-10 DIAGNOSIS — R3914 Feeling of incomplete bladder emptying: Secondary | ICD-10-CM | POA: Diagnosis not present

## 2019-11-10 NOTE — Patient Outreach (Signed)
[name]) Care Management  11/10/2019  [name] Oct 31, 1923 [id number]   Telephone Assessment-Unsuccessful  RN attempted outreach call however unsuccessful. RN able to leave a HIPAA approved voice message requesting a call back.  Plan: Will rescheduled another outreach call next week for ongoing [facility] services.  Douglas Mina, RN Care Management Coordinator [place] Office [phone]

## 2019-11-11 ENCOUNTER — Encounter: Payer: Self-pay | Admitting: Nurse Practitioner

## 2019-11-11 ENCOUNTER — Inpatient Hospital Stay: Payer: Medicare Other

## 2019-11-11 ENCOUNTER — Other Ambulatory Visit: Payer: Self-pay

## 2019-11-11 ENCOUNTER — Telehealth: Payer: Self-pay | Admitting: Oncology

## 2019-11-11 ENCOUNTER — Inpatient Hospital Stay (HOSPITAL_BASED_OUTPATIENT_CLINIC_OR_DEPARTMENT_OTHER): Payer: Medicare Other | Admitting: Nurse Practitioner

## 2019-11-11 VITALS — BP 143/73 | HR 63 | Temp 98.0°F | Resp 17

## 2019-11-11 VITALS — BP 125/68 | HR 82 | Temp 97.5°F | Resp 18 | Ht 64.0 in | Wt 161.3 lb

## 2019-11-11 DIAGNOSIS — D696 Thrombocytopenia, unspecified: Secondary | ICD-10-CM | POA: Diagnosis not present

## 2019-11-11 DIAGNOSIS — D693 Immune thrombocytopenic purpura: Secondary | ICD-10-CM

## 2019-11-11 DIAGNOSIS — I2510 Atherosclerotic heart disease of native coronary artery without angina pectoris: Secondary | ICD-10-CM | POA: Diagnosis not present

## 2019-11-11 DIAGNOSIS — B965 Pseudomonas (aeruginosa) (mallei) (pseudomallei) as the cause of diseases classified elsewhere: Secondary | ICD-10-CM | POA: Diagnosis not present

## 2019-11-11 DIAGNOSIS — I110 Hypertensive heart disease with heart failure: Secondary | ICD-10-CM | POA: Diagnosis not present

## 2019-11-11 DIAGNOSIS — Z452 Encounter for adjustment and management of vascular access device: Secondary | ICD-10-CM | POA: Diagnosis not present

## 2019-11-11 DIAGNOSIS — N390 Urinary tract infection, site not specified: Secondary | ICD-10-CM | POA: Diagnosis not present

## 2019-11-11 DIAGNOSIS — I480 Paroxysmal atrial fibrillation: Secondary | ICD-10-CM | POA: Diagnosis not present

## 2019-11-11 DIAGNOSIS — Z23 Encounter for immunization: Secondary | ICD-10-CM | POA: Diagnosis not present

## 2019-11-11 DIAGNOSIS — I639 Cerebral infarction, unspecified: Secondary | ICD-10-CM

## 2019-11-11 LAB — CBC WITH DIFFERENTIAL (CANCER CENTER ONLY)
Abs Immature Granulocytes: 0.98 10*3/uL — ABNORMAL HIGH (ref 0.00–0.07)
Basophils Absolute: 0.3 10*3/uL — ABNORMAL HIGH (ref 0.0–0.1)
Basophils Relative: 2 %
Eosinophils Absolute: 0.4 10*3/uL (ref 0.0–0.5)
Eosinophils Relative: 3 %
HCT: 37.4 % — ABNORMAL LOW (ref 39.0–52.0)
Hemoglobin: 11.6 g/dL — ABNORMAL LOW (ref 13.0–17.0)
Immature Granulocytes: 8 %
Lymphocytes Relative: 11 %
Lymphs Abs: 1.4 10*3/uL (ref 0.7–4.0)
MCH: 24.9 pg — ABNORMAL LOW (ref 26.0–34.0)
MCHC: 31 g/dL (ref 30.0–36.0)
MCV: 80.4 fL (ref 80.0–100.0)
Monocytes Absolute: 3 10*3/uL — ABNORMAL HIGH (ref 0.1–1.0)
Monocytes Relative: 24 %
Neutro Abs: 6.7 10*3/uL (ref 1.7–7.7)
Neutrophils Relative %: 52 %
Platelet Count: 253 10*3/uL (ref 150–400)
RBC: 4.65 MIL/uL (ref 4.22–5.81)
RDW: 19.3 % — ABNORMAL HIGH (ref 11.5–15.5)
WBC Count: 12.7 10*3/uL — ABNORMAL HIGH (ref 4.0–10.5)
nRBC: 0 % (ref 0.0–0.2)

## 2019-11-11 MED ORDER — PREDNISONE 5 MG PO TABS: ORAL_TABLET | ORAL | 0 refills | Status: DC

## 2019-11-11 MED ORDER — ROMIPLOSTIM 125 MCG ~~LOC~~ SOLR
0.5000 ug/kg | Freq: Once | SUBCUTANEOUS | Status: AC
  Administered 2019-11-11: 35 ug via SUBCUTANEOUS
  Filled 2019-11-11: qty 0.07

## 2019-11-11 NOTE — Patient Instructions (Signed)
Take prednisone 5 mg every other day for 5 doses then STOP

## 2019-11-11 NOTE — Telephone Encounter (Signed)
Scheduled appointments per 9/10 los. Patient is aware of appointment dates and times.

## 2019-11-11 NOTE — Progress Notes (Addendum)
  [place] OFFICE PROGRESS NOTE   Diagnosis:  ITP  INTERVAL HISTORY:   [name] returns as scheduled.  Prednisone was tapered to 5 mg daily 10/21/2019.  He denies bleeding.  He has dyspnea on exertion.    Objective:  Vital signs in last 24 hours:  Blood pressure 125/68, pulse 82, temperature (!) 97.5 F (36.4 C), temperature source Axillary, resp. rate 18, height $RemoveBe'5\' 4"'iqlGPHMdK$  (1.626 m), weight 161 lb 4.8 oz (73.2 kg), SpO2 94 %.    HEENT: No thrush or ulcers. Resp: Lungs clear bilaterally. Cardio: Regular rate and rhythm. GI: Abdomen soft and nontender.  No hepatomegaly.   Vascular: Trace edema at the lower legs bilaterally  Skin: No ecchymosis at the right groin, no bleeding.   Lab Results:  Lab Results  Component Value Date   WBC 12.7 (H) 11/11/2019   HGB 11.6 (L) 11/11/2019   HCT 37.4 (L) 11/11/2019   MCV 80.4 11/11/2019   PLT 253 11/11/2019   NEUTROABS 6.7 11/11/2019    Imaging:  No results found.  Medications: I have reviewed the patient's current medications.  Assessment/Plan: 1. Thrombocytopenia ? Bone marrow biopsy 07/08/2016-no evidence of B-cell lymphoma, 40% cellular bone marrow with trilineage hematopoiesis, megakaryocytes present with normal morphology, normal cytogenetics, negative for BRAF mutation ? Flow cytometry 01/05/2009-no monoclonal B-cell or phenotypically abnormal T-cell population ? Prednisone starting 06/29/2019 ? Nplate 07/04/2019, 9/37/9024,OXBDZH 07/18/2019 ? Prednisone 20 mg daily beginning 07/25/2019 ? Platelets 11,000 on 08/15/2019, prednisone increased to 40 mg daily, Nplate resume 2/99/2426 ? Prednisone decreased to 30 mg daily 08/25/2019, Nplate continued ? Prednisone continued at 30 mg daily 09/13/2019, weekly Nplate continued ? Prednisone taper to 20 mg daily 09/23/2019, weekly Nplate continued ? Prednisone taper to 10 mg daily 09/30/2019, weekly Nplate continued ? Prednisone taper to 5 mg daily 10/21/2019, weekly Nplate  continued ? Prednisone taper to 5 mg every other day for 5 doses then stop 11/11/2019, weekly Nplate continued 2. Hairy cell leukemia 1982, status post splenectomy 3. Coronary artery disease 4. Aortic stenosis 5. 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 Macular degeneration 7. Hearing loss 8. Basal cell carcinoma left cheek 05/24/2019 9. Left upper lobe nodule, faint FDG activity on PET at [facility] 05/31/2013 10. Status post TAVR procedure 09/06/2019 11. Admission with Pseudomonas urosepsis 09/16/2019 12. Acute left MCA distribution CVA 09/16/2019  Disposition: [name] remains stable from a hematologic standpoint.  The platelet count is in normal range.  Prednisone will be further tapered to 5 mg every other day for 5 doses, then stop.  Plan to continue weekly Nplate for now.  He will return for a follow-up CBC and Nplate on 8/34/1962.  We will see him in follow-up on 11/28/2019.  Covid booster is not recommended at this time.  This was reviewed with Mr. and Mrs. [name] at today's appointment.  Patient seen with Dr. Benay Spice.  [name] ANP/GNP-BC   11/11/2019  10:04 AM This was a shared visit with [name].  [name] was interviewed and examined.  The platelet count remains in the normal range.  The prednisone will be tapered off over the next 2 weeks.  He will continue Nplate until prednisone has been discontinued.  Julieanne Manson, MD

## 2019-11-11 NOTE — Patient Instructions (Signed)
Romiplostim injection What is this medicine? ROMIPLOSTIM (roe mi PLOE stim) helps your body make more platelets. This medicine is used to treat low platelets caused by chronic idiopathic thrombocytopenic purpura (ITP). This medicine may be used for other purposes; ask your health care provider or pharmacist if you have questions. COMMON BRAND NAME(S): Nplate What should I tell my health care provider before I take this medicine? They need to know if you have any of these conditions:  bleeding disorders  bone marrow problem, like blood cancer or myelodysplastic syndrome  history of blood clots  liver disease  surgery to remove your spleen  an unusual or allergic reaction to romiplostim, mannitol, other medicines, foods, dyes, or preservatives  pregnant or trying to get pregnant  breast-feeding How should I use this medicine? This medicine is for injection under the skin. It is given by a health care professional in a hospital or clinic setting. A special MedGuide will be given to you before your injection. Read this information carefully each time. Talk to your pediatrician regarding the use of this medicine in children. While this drug may be prescribed for children as young as 1 year for selected conditions, precautions do apply. Overdosage: If you think you have taken too much of this medicine contact a poison control center or emergency room at once. NOTE: This medicine is only for you. Do not share this medicine with others. What if I miss a dose? It is important not to miss your dose. Call your doctor or health care professional if you are unable to keep an appointment. What may interact with this medicine? Interactions are not expected. This list may not describe all possible interactions. Give your health care provider a list of all the medicines, herbs, non-prescription drugs, or dietary supplements you use. Also tell them if you smoke, drink alcohol, or use illegal drugs.  Some items may interact with your medicine. What should I watch for while using this medicine? Your condition will be monitored carefully while you are receiving this medicine. Visit your prescriber or health care professional for regular checks on your progress and for the needed blood tests. It is important to keep all appointments. What side effects may I notice from receiving this medicine? Side effects that you should report to your doctor or health care professional as soon as possible:  allergic reactions like skin rash, itching or hives, swelling of the face, lips, or tongue  signs and symptoms of bleeding such as bloody or black, tarry stools; red or dark brown urine; spitting up blood or brown material that looks like coffee grounds; red spots on the skin; unusual bruising or bleeding from the eyes, gums, or nose  signs and symptoms of a blood clot such as chest pain; shortness of breath; pain, swelling, or warmth in the leg  signs and symptoms of a stroke like changes in vision; confusion; trouble speaking or understanding; severe headaches; sudden numbness or weakness of the face, arm or leg; trouble walking; dizziness; loss of balance or coordination Side effects that usually do not require medical attention (report to your doctor or health care professional if they continue or are bothersome):  headache  pain in arms and legs  pain in mouth  stomach pain This list may not describe all possible side effects. Call your doctor for medical advice about side effects. You may report side effects to FDA at 1-800-FDA-1088. Where should I keep my medicine? This drug is given in a hospital or clinic   and will not be stored at home. NOTE: This sheet is a summary. It may not cover all possible information. If you have questions about this medicine, talk to your doctor, pharmacist, or health care provider.  2020 Elsevier/Gold Standard (2017-02-16 11:10:55)  

## 2019-11-16 ENCOUNTER — Other Ambulatory Visit: Payer: Self-pay | Admitting: *Deleted

## 2019-11-16 ENCOUNTER — Encounter: Payer: Self-pay | Admitting: *Deleted

## 2019-11-16 NOTE — Patient Outreach (Signed)
[place] [facility]) Care Management  11/16/2019  ARA MANO 1923-12-11 [id number]   Telephone Assessment-Unsuccessful  Unsuccessful outreach to this pt. RN able to leave a HIPAA approved voice message requesting a call back.  Plan: Will follow up once again over the next week for ongoing [facility] services.  [name], RN Care Management Coordinator [place] Office [phone]

## 2019-11-19 DIAGNOSIS — I2510 Atherosclerotic heart disease of native coronary artery without angina pectoris: Secondary | ICD-10-CM | POA: Diagnosis not present

## 2019-11-19 DIAGNOSIS — N390 Urinary tract infection, site not specified: Secondary | ICD-10-CM | POA: Diagnosis not present

## 2019-11-19 DIAGNOSIS — I110 Hypertensive heart disease with heart failure: Secondary | ICD-10-CM | POA: Diagnosis not present

## 2019-11-19 DIAGNOSIS — Z452 Encounter for adjustment and management of vascular access device: Secondary | ICD-10-CM | POA: Diagnosis not present

## 2019-11-19 DIAGNOSIS — B965 Pseudomonas (aeruginosa) (mallei) (pseudomallei) as the cause of diseases classified elsewhere: Secondary | ICD-10-CM | POA: Diagnosis not present

## 2019-11-19 DIAGNOSIS — I480 Paroxysmal atrial fibrillation: Secondary | ICD-10-CM | POA: Diagnosis not present

## 2019-11-21 ENCOUNTER — Other Ambulatory Visit: Payer: Self-pay

## 2019-11-21 ENCOUNTER — Inpatient Hospital Stay: Payer: Medicare Other

## 2019-11-21 VITALS — BP 125/59 | HR 69 | Temp 98.3°F | Resp 18

## 2019-11-21 DIAGNOSIS — D509 Iron deficiency anemia, unspecified: Secondary | ICD-10-CM | POA: Diagnosis not present

## 2019-11-21 DIAGNOSIS — H409 Unspecified glaucoma: Secondary | ICD-10-CM | POA: Diagnosis not present

## 2019-11-21 DIAGNOSIS — I5032 Chronic diastolic (congestive) heart failure: Secondary | ICD-10-CM | POA: Diagnosis not present

## 2019-11-21 DIAGNOSIS — R7303 Prediabetes: Secondary | ICD-10-CM | POA: Diagnosis not present

## 2019-11-21 DIAGNOSIS — R918 Other nonspecific abnormal finding of lung field: Secondary | ICD-10-CM | POA: Diagnosis not present

## 2019-11-21 DIAGNOSIS — I2510 Atherosclerotic heart disease of native coronary artery without angina pectoris: Secondary | ICD-10-CM | POA: Diagnosis not present

## 2019-11-21 DIAGNOSIS — K219 Gastro-esophageal reflux disease without esophagitis: Secondary | ICD-10-CM | POA: Diagnosis not present

## 2019-11-21 DIAGNOSIS — F419 Anxiety disorder, unspecified: Secondary | ICD-10-CM | POA: Diagnosis not present

## 2019-11-21 DIAGNOSIS — N390 Urinary tract infection, site not specified: Secondary | ICD-10-CM | POA: Diagnosis not present

## 2019-11-21 DIAGNOSIS — D696 Thrombocytopenia, unspecified: Secondary | ICD-10-CM | POA: Diagnosis not present

## 2019-11-21 DIAGNOSIS — I480 Paroxysmal atrial fibrillation: Secondary | ICD-10-CM | POA: Diagnosis not present

## 2019-11-21 DIAGNOSIS — E876 Hypokalemia: Secondary | ICD-10-CM | POA: Diagnosis not present

## 2019-11-21 DIAGNOSIS — N401 Enlarged prostate with lower urinary tract symptoms: Secondary | ICD-10-CM | POA: Diagnosis not present

## 2019-11-21 DIAGNOSIS — Z452 Encounter for adjustment and management of vascular access device: Secondary | ICD-10-CM | POA: Diagnosis not present

## 2019-11-21 DIAGNOSIS — R011 Cardiac murmur, unspecified: Secondary | ICD-10-CM | POA: Diagnosis not present

## 2019-11-21 DIAGNOSIS — D693 Immune thrombocytopenic purpura: Secondary | ICD-10-CM

## 2019-11-21 DIAGNOSIS — R338 Other retention of urine: Secondary | ICD-10-CM | POA: Diagnosis not present

## 2019-11-21 DIAGNOSIS — E871 Hypo-osmolality and hyponatremia: Secondary | ICD-10-CM | POA: Diagnosis not present

## 2019-11-21 DIAGNOSIS — M47816 Spondylosis without myelopathy or radiculopathy, lumbar region: Secondary | ICD-10-CM | POA: Diagnosis not present

## 2019-11-21 DIAGNOSIS — E785 Hyperlipidemia, unspecified: Secondary | ICD-10-CM | POA: Diagnosis not present

## 2019-11-21 DIAGNOSIS — I350 Nonrheumatic aortic (valve) stenosis: Secondary | ICD-10-CM | POA: Diagnosis not present

## 2019-11-21 DIAGNOSIS — B965 Pseudomonas (aeruginosa) (mallei) (pseudomallei) as the cause of diseases classified elsewhere: Secondary | ICD-10-CM | POA: Diagnosis not present

## 2019-11-21 DIAGNOSIS — M5136 Other intervertebral disc degeneration, lumbar region: Secondary | ICD-10-CM | POA: Diagnosis not present

## 2019-11-21 DIAGNOSIS — M810 Age-related osteoporosis without current pathological fracture: Secondary | ICD-10-CM | POA: Diagnosis not present

## 2019-11-21 DIAGNOSIS — Z23 Encounter for immunization: Secondary | ICD-10-CM | POA: Diagnosis not present

## 2019-11-21 DIAGNOSIS — A419 Sepsis, unspecified organism: Secondary | ICD-10-CM | POA: Diagnosis not present

## 2019-11-21 DIAGNOSIS — I110 Hypertensive heart disease with heart failure: Secondary | ICD-10-CM | POA: Diagnosis not present

## 2019-11-21 LAB — CBC WITH DIFFERENTIAL (CANCER CENTER ONLY)
Abs Immature Granulocytes: 0.37 10*3/uL — ABNORMAL HIGH (ref 0.00–0.07)
Basophils Absolute: 0.2 10*3/uL — ABNORMAL HIGH (ref 0.0–0.1)
Basophils Relative: 1 %
Eosinophils Absolute: 0.4 10*3/uL (ref 0.0–0.5)
Eosinophils Relative: 3 %
HCT: 35.4 % — ABNORMAL LOW (ref 39.0–52.0)
Hemoglobin: 11.2 g/dL — ABNORMAL LOW (ref 13.0–17.0)
Immature Granulocytes: 3 %
Lymphocytes Relative: 13 %
Lymphs Abs: 1.5 10*3/uL (ref 0.7–4.0)
MCH: 25.1 pg — ABNORMAL LOW (ref 26.0–34.0)
MCHC: 31.6 g/dL (ref 30.0–36.0)
MCV: 79.4 fL — ABNORMAL LOW (ref 80.0–100.0)
Monocytes Absolute: 2.7 10*3/uL — ABNORMAL HIGH (ref 0.1–1.0)
Monocytes Relative: 22 %
Neutro Abs: 6.9 10*3/uL (ref 1.7–7.7)
Neutrophils Relative %: 58 %
Platelet Count: 144 10*3/uL — ABNORMAL LOW (ref 150–400)
RBC: 4.46 MIL/uL (ref 4.22–5.81)
RDW: 18.8 % — ABNORMAL HIGH (ref 11.5–15.5)
WBC Count: 12 10*3/uL — ABNORMAL HIGH (ref 4.0–10.5)
nRBC: 0 % (ref 0.0–0.2)

## 2019-11-21 MED ORDER — ROMIPLOSTIM 125 MCG ~~LOC~~ SOLR
0.5000 ug/kg | Freq: Once | SUBCUTANEOUS | Status: AC
  Administered 2019-11-21: 35 ug via SUBCUTANEOUS
  Filled 2019-11-21: qty 0.07

## 2019-11-21 NOTE — Patient Instructions (Signed)
Romiplostim injection What is this medicine? ROMIPLOSTIM (roe mi PLOE stim) helps your body make more platelets. This medicine is used to treat low platelets caused by chronic idiopathic thrombocytopenic purpura (ITP). This medicine may be used for other purposes; ask your health care provider or pharmacist if you have questions. COMMON BRAND NAME(S): Nplate What should I tell my health care provider before I take this medicine? They need to know if you have any of these conditions:  bleeding disorders  bone marrow problem, like blood cancer or myelodysplastic syndrome  history of blood clots  liver disease  surgery to remove your spleen  an unusual or allergic reaction to romiplostim, mannitol, other medicines, foods, dyes, or preservatives  pregnant or trying to get pregnant  breast-feeding How should I use this medicine? This medicine is for injection under the skin. It is given by a health care professional in a hospital or clinic setting. A special MedGuide will be given to you before your injection. Read this information carefully each time. Talk to your pediatrician regarding the use of this medicine in children. While this drug may be prescribed for children as young as 1 year for selected conditions, precautions do apply. Overdosage: If you think you have taken too much of this medicine contact a poison control center or emergency room at once. NOTE: This medicine is only for you. Do not share this medicine with others. What if I miss a dose? It is important not to miss your dose. Call your doctor or health care professional if you are unable to keep an appointment. What may interact with this medicine? Interactions are not expected. This list may not describe all possible interactions. Give your health care provider a list of all the medicines, herbs, non-prescription drugs, or dietary supplements you use. Also tell them if you smoke, drink alcohol, or use illegal drugs.  Some items may interact with your medicine. What should I watch for while using this medicine? Your condition will be monitored carefully while you are receiving this medicine. Visit your prescriber or health care professional for regular checks on your progress and for the needed blood tests. It is important to keep all appointments. What side effects may I notice from receiving this medicine? Side effects that you should report to your doctor or health care professional as soon as possible:  allergic reactions like skin rash, itching or hives, swelling of the face, lips, or tongue  signs and symptoms of bleeding such as bloody or black, tarry stools; red or dark brown urine; spitting up blood or brown material that looks like coffee grounds; red spots on the skin; unusual bruising or bleeding from the eyes, gums, or nose  signs and symptoms of a blood clot such as chest pain; shortness of breath; pain, swelling, or warmth in the leg  signs and symptoms of a stroke like changes in vision; confusion; trouble speaking or understanding; severe headaches; sudden numbness or weakness of the face, arm or leg; trouble walking; dizziness; loss of balance or coordination Side effects that usually do not require medical attention (report to your doctor or health care professional if they continue or are bothersome):  headache  pain in arms and legs  pain in mouth  stomach pain This list may not describe all possible side effects. Call your doctor for medical advice about side effects. You may report side effects to FDA at 1-800-FDA-1088. Where should I keep my medicine? This drug is given in a hospital or clinic   and will not be stored at home. NOTE: This sheet is a summary. It may not cover all possible information. If you have questions about this medicine, talk to your doctor, pharmacist, or health care provider.  2020 Elsevier/Gold Standard (2017-02-16 11:10:55)  

## 2019-11-22 DIAGNOSIS — H35373 Puckering of macula, bilateral: Secondary | ICD-10-CM | POA: Diagnosis not present

## 2019-11-22 DIAGNOSIS — H353221 Exudative age-related macular degeneration, left eye, with active choroidal neovascularization: Secondary | ICD-10-CM | POA: Diagnosis not present

## 2019-11-22 DIAGNOSIS — H43812 Vitreous degeneration, left eye: Secondary | ICD-10-CM | POA: Diagnosis not present

## 2019-11-22 DIAGNOSIS — Z961 Presence of intraocular lens: Secondary | ICD-10-CM | POA: Diagnosis not present

## 2019-11-22 DIAGNOSIS — H43821 Vitreomacular adhesion, right eye: Secondary | ICD-10-CM | POA: Diagnosis not present

## 2019-11-23 ENCOUNTER — Other Ambulatory Visit: Payer: Self-pay | Admitting: *Deleted

## 2019-11-23 NOTE — Patient Outreach (Signed)
[facility]) Care Management  11/23/2019  [name] 01-14-1924 [id number]   Telephone Assessment-Unsuccessful  RN attempted outreach call however unsuccessful RN able to leave a HIPAA approved voice message requesting a call back.   Plan: Will attempt another outreach in a few weeks for ongoing [facility] services.   [name], RN Care Management Coordinator [place] Office [phone]

## 2019-11-25 ENCOUNTER — Other Ambulatory Visit: Payer: Self-pay | Admitting: *Deleted

## 2019-11-25 DIAGNOSIS — N390 Urinary tract infection, site not specified: Secondary | ICD-10-CM | POA: Diagnosis not present

## 2019-11-25 DIAGNOSIS — A419 Sepsis, unspecified organism: Secondary | ICD-10-CM | POA: Diagnosis not present

## 2019-11-25 DIAGNOSIS — I2510 Atherosclerotic heart disease of native coronary artery without angina pectoris: Secondary | ICD-10-CM | POA: Diagnosis not present

## 2019-11-25 DIAGNOSIS — B965 Pseudomonas (aeruginosa) (mallei) (pseudomallei) as the cause of diseases classified elsewhere: Secondary | ICD-10-CM | POA: Diagnosis not present

## 2019-11-25 DIAGNOSIS — Z452 Encounter for adjustment and management of vascular access device: Secondary | ICD-10-CM | POA: Diagnosis not present

## 2019-11-25 DIAGNOSIS — I480 Paroxysmal atrial fibrillation: Secondary | ICD-10-CM | POA: Diagnosis not present

## 2019-11-25 NOTE — Patient Outreach (Signed)
[facility]) Care Management  11/25/2019  [name] 05-Dec-1923 [id number]   Telephone Assessment-Successful  RN received a call back from the pt's spouse [name]) who indicates pt is doing "a lot better". Update received indicating pt's PICC line with HHRN for IV therapy has ended however pt continues to received PT with saturations 94-95% on room air with activities. Finished Prednisone last week however Dr. Malachy [name] continues to monitoring pt's platalets for ongoing weekly replaced shots on his levels. Blood pressures have been good with the most recent readings at 125/59 on 9/20.  Plan of care discussed with updates on pt's progress and goals. Will continue to reiterate on the goals and interventions that remain in place and continue to monitor pt's progress with a follow up call next month. Will update  His provider next month on pt's disposition with [facility] services.   Goals Addressed            This Visit's Progress   . 11/25/2019 [facility] update   On track    [place] (see longtitudinal plan of care for additional care plan information)  Objective:  . Last practice recorded BP readings:  BP Readings from Last 3 Encounters:  09/21/19 116/73  09/14/19 (!) 142/74  09/13/19 134/70 .   Marland Kitchen Most recent eGFR/CrCl: No results [name] for: EGFR  No components [name] for: CRCL  Current Barriers:  Marland Kitchen Knowledge Deficits related to basic understanding of hypertension diagnosis . Knowledge Deficits related to understanding of medications prescribed for management of hypertension . Knowledge deficit related to dangers of uncontrolled hypertension  Case Manager Clinical Goal(s):  Marland Kitchen Over the next 90 days, patient will verbalize understanding of plan for hypertension management . Over the next 30 days, patient will attend all scheduled medical appointments: all appointments were reviewed today with verification of sufficient transportation. . Over the next 30 days, patient  will demonstrate improved adherence to prescribed treatment plan for hypertension as evidenced by taking all medications as prescribed, monitoring and recording blood pressure as directed, adhering to low sodium/DASH diet . Over the next 30 days, patient will verbalize basic understanding of hypertension disease process and self health management plan as evidenced by report normal blood pressures  Interventions:  . Evaluation of current treatment plan related to hypertension self management and patient's adherence to plan as established by provider. . Reviewed medications with patient and discussed importance of compliance . Discussed plans with patient for ongoing care management follow up and provided patient with direct contact information for care management team . Advised patient, providing education and rationale, to monitor blood pressure daily and record, calling PCP for findings outside established parameters.  . Provided education regarding s/s of stroke and stroke prevention . Provided education regarding s/s of heart attack . Provided education regarding s/s of DASH diet/Low salt diet . Provided education regarding complications of uncontrolled blood pressure . Provided education regarding s/s hypertensive crisis  Patient Self Care Activities:  . Self administers medications as prescribed . Attends all scheduled provider appointments . Calls provider office for new concerns, questions, or BP outside discussed parameters . Monitors BP and records as discussed . Adheres to a low sodium diet/DASH diet  Initial goal documentation 30 day goals met for adherence to medications and medical appointments.

## 2019-11-28 ENCOUNTER — Inpatient Hospital Stay (HOSPITAL_BASED_OUTPATIENT_CLINIC_OR_DEPARTMENT_OTHER): Payer: Medicare Other | Admitting: Oncology

## 2019-11-28 ENCOUNTER — Inpatient Hospital Stay: Payer: Medicare Other

## 2019-11-28 ENCOUNTER — Telehealth: Payer: Self-pay | Admitting: Oncology

## 2019-11-28 ENCOUNTER — Other Ambulatory Visit: Payer: Self-pay

## 2019-11-28 VITALS — BP 135/75 | HR 69 | Temp 97.8°F | Resp 18 | Ht 64.0 in | Wt 162.6 lb

## 2019-11-28 DIAGNOSIS — I639 Cerebral infarction, unspecified: Secondary | ICD-10-CM | POA: Diagnosis not present

## 2019-11-28 DIAGNOSIS — Z23 Encounter for immunization: Secondary | ICD-10-CM | POA: Diagnosis not present

## 2019-11-28 DIAGNOSIS — D696 Thrombocytopenia, unspecified: Secondary | ICD-10-CM | POA: Diagnosis not present

## 2019-11-28 DIAGNOSIS — D693 Immune thrombocytopenic purpura: Secondary | ICD-10-CM

## 2019-11-28 LAB — CBC WITH DIFFERENTIAL (CANCER CENTER ONLY)
Abs Immature Granulocytes: 0.27 10*3/uL — ABNORMAL HIGH (ref 0.00–0.07)
Basophils Absolute: 0.1 10*3/uL (ref 0.0–0.1)
Basophils Relative: 1 %
Eosinophils Absolute: 0.4 10*3/uL (ref 0.0–0.5)
Eosinophils Relative: 4 %
HCT: 36.4 % — ABNORMAL LOW (ref 39.0–52.0)
Hemoglobin: 11.5 g/dL — ABNORMAL LOW (ref 13.0–17.0)
Immature Granulocytes: 3 %
Lymphocytes Relative: 15 %
Lymphs Abs: 1.5 10*3/uL (ref 0.7–4.0)
MCH: 25.6 pg — ABNORMAL LOW (ref 26.0–34.0)
MCHC: 31.6 g/dL (ref 30.0–36.0)
MCV: 80.9 fL (ref 80.0–100.0)
Monocytes Absolute: 2.4 10*3/uL — ABNORMAL HIGH (ref 0.1–1.0)
Monocytes Relative: 24 %
Neutro Abs: 5.5 10*3/uL (ref 1.7–7.7)
Neutrophils Relative %: 53 %
Platelet Count: 157 10*3/uL (ref 150–400)
RBC: 4.5 MIL/uL (ref 4.22–5.81)
RDW: 18.9 % — ABNORMAL HIGH (ref 11.5–15.5)
WBC Count: 10 10*3/uL (ref 4.0–10.5)
nRBC: 0 % (ref 0.0–0.2)

## 2019-11-28 MED ORDER — INFLUENZA VAC A&B SA ADJ QUAD 0.5 ML IM PRSY
PREFILLED_SYRINGE | INTRAMUSCULAR | Status: AC
  Filled 2019-11-28: qty 0.5

## 2019-11-28 MED ORDER — INFLUENZA VAC A&B SA ADJ QUAD 0.5 ML IM PRSY
0.5000 mL | PREFILLED_SYRINGE | Freq: Once | INTRAMUSCULAR | Status: AC
  Administered 2019-11-28: 0.5 mL via INTRAMUSCULAR

## 2019-11-28 NOTE — Telephone Encounter (Signed)
Scheduled appointments per 9/27 los. Gave patient calendar print out.  

## 2019-11-28 NOTE — Progress Notes (Signed)
  [place] OFFICE PROGRESS NOTE   Diagnosis: Thrombocytopenia  INTERVAL HISTORY:   Mr. Davenport returns as scheduled.  Prednisone has been discontinued.  No bleeding or bruising.  He complains of exertional dyspnea.  He reports the dyspnea is no better following the TAVR procedure.  He has a dry mouth.  Objective:  Vital signs in last 24 hours:  Blood pressure 135/75, pulse 69, temperature 97.8 F (36.6 C), temperature source Tympanic, resp. rate 18, height $RemoveBe'5\' 4"'XrvcQnTIK$  (1.626 m), weight 162 lb 9.6 oz (73.8 kg), SpO2 97 %.    HEENT: No thrush or ulcers Resp: Lungs clear bilaterally Cardio: Regular rate and rhythm GI: No hepatomegaly Vascular: No leg edema     Lab Results:  Lab Results  Component Value Date   WBC 10.0 11/28/2019   HGB 11.5 (L) 11/28/2019   HCT 36.4 (L) 11/28/2019   MCV 80.9 11/28/2019   PLT 157 11/28/2019   NEUTROABS 5.5 11/28/2019    CMP  Lab Results  Component Value Date   NA 136 10/31/2019   K 4.3 10/31/2019   CL 100 10/31/2019   CO2 26 10/31/2019   GLUCOSE 126 (H) 10/31/2019   BUN 23 10/31/2019   CREATININE 1.21 10/31/2019   CALCIUM 9.4 10/31/2019   PROT 4.6 (L) 09/18/2019   ALBUMIN 2.2 (L) 09/18/2019   AST 19 09/18/2019   ALT 21 09/18/2019   ALKPHOS 59 09/18/2019   BILITOT 0.5 09/18/2019   GFRNONAA 51 (L) 10/31/2019   GFRAA 59 (L) 10/31/2019     Medications: I have reviewed the patient's current medications.   Assessment/Plan: 1. Thrombocytopenia ? Bone marrow biopsy 07/08/2016-no evidence of B-cell lymphoma, 40% cellular bone marrow with trilineage hematopoiesis, megakaryocytes present with normal morphology, normal cytogenetics, negative for BRAF mutation ? Flow cytometry 01/05/2009-no monoclonal B-cell or phenotypically abnormal T-cell population ? Prednisone starting 06/29/2019 ? Nplate 07/04/2019, 9/67/5916,BWGYKZ 07/18/2019 ? Prednisone 20 mg daily beginning 07/25/2019 ? Platelets 11,000 on 08/15/2019, prednisone  increased to 40 mg daily, Nplate resume 9/93/5701 ? Prednisone decreased to 30 mg daily 08/25/2019, Nplate continued ? Prednisone continued at 30 mg daily 09/13/2019, weekly Nplate continued ? Prednisone taper to 20 mg daily 09/23/2019, weekly Nplate continued ? Prednisone taper to 10 mg daily 09/30/2019, weekly Nplate continued ? Prednisone taper to 5 mg daily 10/21/2019, weekly Nplate continued ? Prednisone taper to 5 mg every other day for 5 doses then stop 11/11/2019, weekly Nplate continued ? Nplate last given 7/79/3903 2. Hairy cell leukemia 1982, status post splenectomy 3. Coronary artery disease 4. Aortic stenosis 5. 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 Macular degeneration 7. Hearing loss 8. Basal cell carcinoma left cheek 05/24/2019 9. Left upper lobe nodule, faint FDG activity on PET at [facility] 05/31/2013 10. Status post TAVR procedure 09/06/2019 11. Admission with Pseudomonas urosepsis 09/16/2019 12. Acute left MCA distribution CVA 09/16/2019    Disposition: [name] appears unchanged.  The platelet count remains in the normal range while off of prednisone.  Nplate will be discontinued today.  He will return for a CBC in 1 week and an office visit in 2 weeks.  He will call for bleeding or bruising.  I recommended he follow-up with cardiology to evaluate the persistent exertional dyspnea.  [name] received an influenza vaccine today.  He will be scheduled for the COVID-19 booster vaccine when he returns in 2 weeks.  Betsy Coder, MD  11/28/2019  10:45 AM

## 2019-12-02 DIAGNOSIS — I480 Paroxysmal atrial fibrillation: Secondary | ICD-10-CM | POA: Diagnosis not present

## 2019-12-02 DIAGNOSIS — I2510 Atherosclerotic heart disease of native coronary artery without angina pectoris: Secondary | ICD-10-CM | POA: Diagnosis not present

## 2019-12-02 DIAGNOSIS — B965 Pseudomonas (aeruginosa) (mallei) (pseudomallei) as the cause of diseases classified elsewhere: Secondary | ICD-10-CM | POA: Diagnosis not present

## 2019-12-02 DIAGNOSIS — A419 Sepsis, unspecified organism: Secondary | ICD-10-CM | POA: Diagnosis not present

## 2019-12-02 DIAGNOSIS — Z452 Encounter for adjustment and management of vascular access device: Secondary | ICD-10-CM | POA: Diagnosis not present

## 2019-12-02 DIAGNOSIS — N390 Urinary tract infection, site not specified: Secondary | ICD-10-CM | POA: Diagnosis not present

## 2019-12-05 ENCOUNTER — Inpatient Hospital Stay: Payer: Medicare Other | Attending: Oncology

## 2019-12-05 ENCOUNTER — Other Ambulatory Visit: Payer: Self-pay

## 2019-12-05 ENCOUNTER — Telehealth: Payer: Self-pay | Admitting: *Deleted

## 2019-12-05 DIAGNOSIS — Z23 Encounter for immunization: Secondary | ICD-10-CM | POA: Diagnosis not present

## 2019-12-05 DIAGNOSIS — D693 Immune thrombocytopenic purpura: Secondary | ICD-10-CM

## 2019-12-05 DIAGNOSIS — D696 Thrombocytopenia, unspecified: Secondary | ICD-10-CM | POA: Diagnosis not present

## 2019-12-05 DIAGNOSIS — R0609 Other forms of dyspnea: Secondary | ICD-10-CM | POA: Diagnosis not present

## 2019-12-05 LAB — CBC WITH DIFFERENTIAL (CANCER CENTER ONLY)
Abs Immature Granulocytes: 0.3 10*3/uL — ABNORMAL HIGH (ref 0.00–0.07)
Basophils Absolute: 0.2 10*3/uL — ABNORMAL HIGH (ref 0.0–0.1)
Basophils Relative: 2 %
Eosinophils Absolute: 0.4 10*3/uL (ref 0.0–0.5)
Eosinophils Relative: 4 %
HCT: 36.1 % — ABNORMAL LOW (ref 39.0–52.0)
Hemoglobin: 11.3 g/dL — ABNORMAL LOW (ref 13.0–17.0)
Immature Granulocytes: 3 %
Lymphocytes Relative: 20 %
Lymphs Abs: 1.9 10*3/uL (ref 0.7–4.0)
MCH: 25.6 pg — ABNORMAL LOW (ref 26.0–34.0)
MCHC: 31.3 g/dL (ref 30.0–36.0)
MCV: 81.7 fL (ref 80.0–100.0)
Monocytes Absolute: 2.1 10*3/uL — ABNORMAL HIGH (ref 0.1–1.0)
Monocytes Relative: 22 %
Neutro Abs: 4.7 10*3/uL (ref 1.7–7.7)
Neutrophils Relative %: 49 %
Platelet Count: 195 10*3/uL (ref 150–400)
RBC: 4.42 MIL/uL (ref 4.22–5.81)
RDW: 18.9 % — ABNORMAL HIGH (ref 11.5–15.5)
WBC Count: 9.5 10*3/uL (ref 4.0–10.5)
nRBC: 0 % (ref 0.0–0.2)

## 2019-12-05 NOTE — Telephone Encounter (Signed)
-----   Message from Ladell Pier, MD sent at 12/05/2019  1:53 PM EDT ----- Please call patient, platelet count remains normal, follow-up as scheduled

## 2019-12-05 NOTE — Telephone Encounter (Signed)
Left message w/normal platelet count and to f/u as scheduled.

## 2019-12-08 DIAGNOSIS — E785 Hyperlipidemia, unspecified: Secondary | ICD-10-CM | POA: Diagnosis not present

## 2019-12-08 DIAGNOSIS — F5104 Psychophysiologic insomnia: Secondary | ICD-10-CM | POA: Diagnosis not present

## 2019-12-08 DIAGNOSIS — I480 Paroxysmal atrial fibrillation: Secondary | ICD-10-CM | POA: Diagnosis not present

## 2019-12-08 DIAGNOSIS — M5136 Other intervertebral disc degeneration, lumbar region: Secondary | ICD-10-CM | POA: Diagnosis not present

## 2019-12-08 DIAGNOSIS — Z452 Encounter for adjustment and management of vascular access device: Secondary | ICD-10-CM | POA: Diagnosis not present

## 2019-12-08 DIAGNOSIS — I2510 Atherosclerotic heart disease of native coronary artery without angina pectoris: Secondary | ICD-10-CM | POA: Diagnosis not present

## 2019-12-08 DIAGNOSIS — B965 Pseudomonas (aeruginosa) (mallei) (pseudomallei) as the cause of diseases classified elsewhere: Secondary | ICD-10-CM | POA: Diagnosis not present

## 2019-12-08 DIAGNOSIS — N320 Bladder-neck obstruction: Secondary | ICD-10-CM | POA: Diagnosis not present

## 2019-12-08 DIAGNOSIS — N390 Urinary tract infection, site not specified: Secondary | ICD-10-CM | POA: Diagnosis not present

## 2019-12-08 DIAGNOSIS — I350 Nonrheumatic aortic (valve) stenosis: Secondary | ICD-10-CM | POA: Diagnosis not present

## 2019-12-08 DIAGNOSIS — I10 Essential (primary) hypertension: Secondary | ICD-10-CM | POA: Diagnosis not present

## 2019-12-08 DIAGNOSIS — Z8673 Personal history of transient ischemic attack (TIA), and cerebral infarction without residual deficits: Secondary | ICD-10-CM | POA: Diagnosis not present

## 2019-12-08 DIAGNOSIS — H9193 Unspecified hearing loss, bilateral: Secondary | ICD-10-CM | POA: Diagnosis not present

## 2019-12-08 DIAGNOSIS — A419 Sepsis, unspecified organism: Secondary | ICD-10-CM | POA: Diagnosis not present

## 2019-12-08 DIAGNOSIS — R972 Elevated prostate specific antigen [PSA]: Secondary | ICD-10-CM | POA: Diagnosis not present

## 2019-12-08 DIAGNOSIS — D696 Thrombocytopenia, unspecified: Secondary | ICD-10-CM | POA: Diagnosis not present

## 2019-12-09 NOTE — Telephone Encounter (Signed)
I would recommend staying away from a nitro patch or Imdur because it could cause fainting.

## 2019-12-12 ENCOUNTER — Inpatient Hospital Stay: Payer: Medicare Other

## 2019-12-12 ENCOUNTER — Telehealth: Payer: Self-pay | Admitting: Nurse Practitioner

## 2019-12-12 ENCOUNTER — Encounter: Payer: Self-pay | Admitting: Nurse Practitioner

## 2019-12-12 ENCOUNTER — Other Ambulatory Visit: Payer: Self-pay

## 2019-12-12 ENCOUNTER — Inpatient Hospital Stay (HOSPITAL_BASED_OUTPATIENT_CLINIC_OR_DEPARTMENT_OTHER): Payer: Medicare Other | Admitting: Nurse Practitioner

## 2019-12-12 VITALS — BP 129/72 | HR 66 | Temp 97.7°F | Resp 18 | Ht 64.0 in | Wt 164.5 lb

## 2019-12-12 DIAGNOSIS — I639 Cerebral infarction, unspecified: Secondary | ICD-10-CM

## 2019-12-12 DIAGNOSIS — R0609 Other forms of dyspnea: Secondary | ICD-10-CM | POA: Diagnosis not present

## 2019-12-12 DIAGNOSIS — Z23 Encounter for immunization: Secondary | ICD-10-CM | POA: Diagnosis not present

## 2019-12-12 DIAGNOSIS — D693 Immune thrombocytopenic purpura: Secondary | ICD-10-CM

## 2019-12-12 DIAGNOSIS — D696 Thrombocytopenia, unspecified: Secondary | ICD-10-CM | POA: Diagnosis not present

## 2019-12-12 LAB — CBC WITH DIFFERENTIAL (CANCER CENTER ONLY)
Abs Immature Granulocytes: 0.17 10*3/uL — ABNORMAL HIGH (ref 0.00–0.07)
Basophils Absolute: 0.2 10*3/uL — ABNORMAL HIGH (ref 0.0–0.1)
Basophils Relative: 2 %
Eosinophils Absolute: 0.3 10*3/uL (ref 0.0–0.5)
Eosinophils Relative: 3 %
HCT: 35.8 % — ABNORMAL LOW (ref 39.0–52.0)
Hemoglobin: 11.2 g/dL — ABNORMAL LOW (ref 13.0–17.0)
Immature Granulocytes: 2 %
Lymphocytes Relative: 19 %
Lymphs Abs: 1.8 10*3/uL (ref 0.7–4.0)
MCH: 25.3 pg — ABNORMAL LOW (ref 26.0–34.0)
MCHC: 31.3 g/dL (ref 30.0–36.0)
MCV: 81 fL (ref 80.0–100.0)
Monocytes Absolute: 2.2 10*3/uL — ABNORMAL HIGH (ref 0.1–1.0)
Monocytes Relative: 22 %
Neutro Abs: 5.2 10*3/uL (ref 1.7–7.7)
Neutrophils Relative %: 52 %
Platelet Count: 164 10*3/uL (ref 150–400)
RBC: 4.42 MIL/uL (ref 4.22–5.81)
RDW: 18.6 % — ABNORMAL HIGH (ref 11.5–15.5)
WBC Count: 9.9 10*3/uL (ref 4.0–10.5)
nRBC: 0 % (ref 0.0–0.2)

## 2019-12-12 NOTE — Progress Notes (Addendum)
  [facility] OFFICE PROGRESS NOTE   Diagnosis: Thrombocytopenia  INTERVAL HISTORY:   [name] returns as scheduled.  He denies bleeding.  He continues to have dyspnea on exertion.  His wife feels he is doing well.  Objective:  Vital signs in last 24 hours:  Blood pressure 129/72, pulse 66, temperature 97.7 F (36.5 C), temperature source Tympanic, resp. rate 18, height $RemoveBe'5\' 4"'FsHBcUrZg$  (1.626 m), weight 164 lb 8 oz (74.6 kg), SpO2 94 %.    HEENT: No thrush or ulcers. Resp: Lungs clear bilaterally. Cardio: Regular rate and rhythm. GI: No hepatosplenomegaly. Vascular: Trace bilateral lower extremity edema.    Lab Results:  Lab Results  Component Value Date   WBC 9.9 12/12/2019   HGB 11.2 (L) 12/12/2019   HCT 35.8 (L) 12/12/2019   MCV 81.0 12/12/2019   PLT 164 12/12/2019   NEUTROABS 5.2 12/12/2019    Imaging:  No results found.  Medications: I have reviewed the patient's current medications.  Assessment/Plan: 1. Thrombocytopenia ? Bone marrow biopsy 07/08/2016-no evidence of B-cell lymphoma, 40% cellular bone marrow with trilineage hematopoiesis, megakaryocytes present with normal morphology, normal cytogenetics, negative for BRAF mutation ? Flow cytometry 01/05/2009-no monoclonal B-cell or phenotypically abnormal T-cell population ? Prednisone starting 06/29/2019 ? Nplate 07/04/2019, 4/76/5465,KPTWSF 07/18/2019 ? Prednisone 20 mg daily beginning 07/25/2019 ? Platelets 11,000 on 08/15/2019, prednisone increased to 40 mg daily, Nplate resume 6/81/2751 ? Prednisone decreased to 30 mg daily 08/25/2019, Nplate continued ? Prednisone continued at 30 mg daily 09/13/2019, weekly Nplate continued ? Prednisone taper to 20 mg daily 09/23/2019, weekly Nplate continued ? Prednisone taper to 10 mg daily 09/30/2019, weekly Nplate continued ? Prednisone taper to 5 mg daily 10/21/2019, weekly Nplate continued ? Prednisone taper to 5 mg every other day for 5 doses then stop 11/11/2019,  weekly Nplate continued ? Nplate last given 7/00/1749 2. Hairy cell leukemia 1982, status post splenectomy 3. Coronary artery disease 4. Aortic stenosis 5. 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 Macular degeneration 7. Hearing loss 8. Basal cell carcinoma left cheek 05/24/2019 9. Left upper lobe nodule, faint FDG activity on PET at [facility] 05/31/2013 10. Status post TAVR procedure 09/06/2019 11. Admission with Pseudomonas urosepsis 09/16/2019 12. Acute left MCA distribution CVA 09/16/2019  Disposition: [name] appears unchanged.  We reviewed the CBC from today.  Platelet count remains in normal range.  He will return for a CBC in 2 weeks, CBC and follow-up in 4 weeks.  Patient seen with Dr. Benay Spice.    [name] ANP/GNP-BC   12/12/2019  1:48 PM  This was a shared visit with [name].  The platelet count remains in the normal range while off of prednisone and Nplate.  The plan is to continue observation.  Julieanne Manson, MD

## 2019-12-12 NOTE — Telephone Encounter (Signed)
Scheduled per 10/11 los. Printed appt calendar for pt. 

## 2019-12-14 DIAGNOSIS — N390 Urinary tract infection, site not specified: Secondary | ICD-10-CM | POA: Diagnosis not present

## 2019-12-14 DIAGNOSIS — B965 Pseudomonas (aeruginosa) (mallei) (pseudomallei) as the cause of diseases classified elsewhere: Secondary | ICD-10-CM | POA: Diagnosis not present

## 2019-12-14 DIAGNOSIS — I480 Paroxysmal atrial fibrillation: Secondary | ICD-10-CM | POA: Diagnosis not present

## 2019-12-14 DIAGNOSIS — A419 Sepsis, unspecified organism: Secondary | ICD-10-CM | POA: Diagnosis not present

## 2019-12-14 DIAGNOSIS — Z452 Encounter for adjustment and management of vascular access device: Secondary | ICD-10-CM | POA: Diagnosis not present

## 2019-12-14 DIAGNOSIS — I2510 Atherosclerotic heart disease of native coronary artery without angina pectoris: Secondary | ICD-10-CM | POA: Diagnosis not present

## 2019-12-15 ENCOUNTER — Telehealth (HOSPITAL_COMMUNITY): Payer: Self-pay | Admitting: *Deleted

## 2019-12-15 ENCOUNTER — Telehealth (HOSPITAL_COMMUNITY): Payer: Self-pay

## 2019-12-15 NOTE — Telephone Encounter (Signed)
Left message to call cardiac rehab regarding orientation appointment on 12/20/19.Barnet Pall, RN,BSN 12/15/2019 1:33 PM

## 2019-12-19 NOTE — Telephone Encounter (Signed)
Cardiac Rehab Medication Review by a Pharmacist  Does the patient  feel that his/her medications are working for him/her?  yes  Has the patient been experiencing any side effects to the medications prescribed?  no  Does the patient measure his/her own blood pressure or blood glucose at home?  Yes, checks blood pressure every now and then. They check it at physical therapy appointments.   Does the patient have any problems obtaining medications due to transportation or finances?   no  Understanding of regimen: excellent Understanding of indications: excellent Potential of compliance: excellent    Pharmacist Intervention: n/a   Dimple Nanas, PharmD PGY-1 Acute Care Pharmacy Resident 12/19/2019 12:17 PM

## 2019-12-20 ENCOUNTER — Encounter (HOSPITAL_COMMUNITY)
Admission: RE | Admit: 2019-12-20 | Discharge: 2019-12-20 | Disposition: A | Discharge status: Home / Self Care | Payer: Medicare Other | Source: Ambulatory Visit | Attending: Cardiovascular Disease | Admitting: Cardiovascular Disease

## 2019-12-20 ENCOUNTER — Other Ambulatory Visit: Payer: Self-pay

## 2019-12-20 ENCOUNTER — Encounter (HOSPITAL_COMMUNITY): Payer: Self-pay

## 2019-12-20 VITALS — BP 122/62 | Ht 64.0 in | Wt 165.6 lb

## 2019-12-20 DIAGNOSIS — Z952 Presence of prosthetic heart valve: Secondary | ICD-10-CM

## 2019-12-20 NOTE — Progress Notes (Signed)
Cardiac Individual Treatment Plan  Patient Details  Name: [name] MRN: [id number] Date of Birth: [birth year] Referring Provider:     [place] from 12/20/2019 in [place]  Referring Provider Lauree Chandler, MD      Initial Encounter Date:    CARDIAC REHAB PHASE II ORIENTATION from 12/20/2019 in [facility]  Date 12/20/19      Visit Diagnosis: S/P TAVR (transcatheter aortic valve replacement) 09/06/19  Patient's Home Medications on Admission:  Current Outpatient Medications:  .  acetaminophen (TYLENOL) 500 MG tablet, Take 1,000 mg by mouth every 8 (eight) hours as needed for moderate pain., Disp: , Rfl:  .  amoxicillin (AMOXIL) 500 MG tablet, Take 4 capsules (2,000 mg) one hour prior to all dental visits., Disp: 8 tablet, Rfl: 11 .  aspirin EC 81 MG EC tablet, Take 1 tablet (81 mg total) by mouth daily. Swallow whole., Disp: 30 tablet, Rfl: 11 .  atorvastatin (LIPITOR) 20 MG tablet, TAKE 1 TABLET BY MOUTH DAILY AT 6 PM (Patient taking differently: Take 20 mg by mouth daily at 6 PM. ), Disp: 90 tablet, Rfl: 3 .  Cholecalciferol (VITAMIN D3) 50 MCG (2000 UT) TABS, Take 2,000 Units by mouth every evening., Disp: , Rfl:  .  desonide (DESOWEN) 0.05 % cream, Apply 1 application topically 2 (two) times daily as needed (rash/irritation.).  (Patient not taking: Reported on 12/19/2019), Disp: , Rfl:  .  finasteride (PROSCAR) 5 MG tablet, Take 5 mg by mouth daily., Disp: , Rfl:  .  fluocinonide (LIDEX) 0.05 % external solution, Apply 1 application topically 2 (two) times daily as needed (apply to ears).  (Patient not taking: Reported on 12/19/2019), Disp: , Rfl: 11 .  furosemide (LASIX) 40 MG tablet, Take 0.5 tablets (20 mg total) by mouth daily., Disp: , Rfl:  .  latanoprost (XALATAN) 0.005 % ophthalmic solution, Place 1 drop into both eyes at bedtime. , Disp: , Rfl:  .  metoprolol tartrate  (LOPRESSOR) 25 MG tablet, Take 0.5 tablets (12.5 mg total) by mouth daily., Disp: 90 tablet, Rfl: 2 .  Multiple Vitamins-Minerals (PRESERVISION AREDS 2 PO), Take 1 capsule by mouth 2 (two) times daily. , Disp: , Rfl:  .  nitroGLYCERIN (NITROSTAT) 0.4 MG SL tablet, Place 1 tablet (0.4 mg total) under the tongue every 5 (five) minutes as needed for chest pain. Please make yearly appt with Dr. Tamala Julian for September. 1st attempt, Disp: 25 tablet, Rfl: 2 .  pantoprazole (PROTONIX) 40 MG tablet, TAKE 1 TABLET BY MOUTH DAILY GENERIC EQUIVALENT FOR PROTONIX (Patient taking differently: Take 40 mg by mouth daily. ), Disp: 90 tablet, Rfl: 2 .  potassium chloride (KLOR-CON) 10 MEQ tablet, Take 1 tablet (10 mEq total) by mouth daily., Disp: 90 tablet, Rfl: 3 .  ranibizumab (LUCENTIS) 0.5 MG/0.05ML SOLN, 0.5 mg by Intravitreal route every 6 (six) weeks. Left Eye ONLY, Disp: , Rfl:  .  sertraline (ZOLOFT) 50 MG tablet, Take 50 mg by mouth every evening. , Disp: , Rfl:  .  tamsulosin (FLOMAX) 0.4 MG CAPS capsule, Take 0.8 mg by mouth daily after supper. , Disp: , Rfl:  .  timolol (BETIMOL) 0.5 % ophthalmic solution, Place 1 drop into the right eye daily. , Disp: , Rfl:   Past Medical History: Past Medical History:  Diagnosis Date  . Acute appendicitis   . Anemia   . Anxiety   . Arthritis    "back" (03/14/2014)  .  Blood dyscrasia    hairy cell leukemia  . CAD (coronary artery disease)    a. 03/14/14  s/p overlapping DES x2 to mid-distal RCA.  . 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 Colon cancer ([place]) 1984  . Compression fracture of lumbar spine, non-traumatic ([place])   . DJD (degenerative joint disease) of lumbar spine   . Dyslipidemia   . Dyspnea   . Elevated PSA   . GERD (gastroesophageal reflux disease)   . Glaucoma   . Hairy cell leukemia ([place]) dx'd 1980  . Heart murmur   . History of blood transfusion    "several; related to hairy cell leukemia & tx "  . History of stomach  ulcers 1968  . Hypertension   . Leukemia, hairy cell ([place])   . Malnutrition ([place])   . Osteoarthritis   . Osteoporosis   . Pneumonia   . 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 Angelena Form and Cyndia Bent.   . Severe aortic stenosis    s/p tavr  . Skin cancer of face     Tobacco Use: Social History   Tobacco Use  Smoking Status Former Smoker  . Packs/day: 0.25  . Years: 1.00  . Pack years: 0.25  . Types: Cigarettes, Cigars  Smokeless Tobacco Never Used  Tobacco Comment   occasional social smoker during college.    Labs: Recent Review Flowsheet Data    Labs for ITP Cardiac and Pulmonary Rehab Latest Ref Rng & Units 09/06/2019 09/06/2019 09/06/2019 09/16/2019 09/17/2019   Cholestrol 0 - 200 mg/dL - - - - 96   LDLCALC 0 - 99 mg/dL - - - - 43   HDL >40 mg/dL - - - - 30(L)   Trlycerides <150 mg/dL - - - - 115   Hemoglobin A1c 4.8 - 5.6 % - - - - 6.6(H)   PHART 7.35 - 7.45 - - - - -   PCO2ART 32 - 48 mmHg - - - - -   HCO3 20.0 - 28.0 mmol/L - - - - -   TCO2 22 - 32 mmol/L 24 26 22 28  -   ACIDBASEDEF 0.0 - 2.0 mmol/L - - - - -   O2SAT % - - - - -      Capillary Blood Glucose: Lab Results  Component Value Date   GLUCAP 78 09/16/2019     Exercise Target Goals: Exercise Program Goal: Individual exercise prescription set using results from initial 6 min walk test and THRR while considering  patient's activity barriers and safety.   Exercise Prescription Goal: Starting with aerobic activity 30 plus minutes a day, 3 days per week for initial exercise prescription. Provide home exercise prescription and guidelines that participant acknowledges understanding prior to discharge.  Activity Barriers & Risk Stratification:  Activity Barriers & Cardiac Risk Stratification - 12/20/19 1259      Activity Barriers & Cardiac Risk Stratification   Activity Barriers Balance Concerns;Deconditioning;Shortness of  Breath    Cardiac Risk Stratification High           6 Minute Walk:  6 Minute Walk    Row Name 12/20/19 1101         6 Minute Walk   Phase Initial     Distance 810 feet     Walk Time 6 minutes     # of Rest Breaks 2     MPH 1.5  METS 0.4     RPE 11     Perceived Dyspnea  1     VO2 Peak 1.43     Symptoms Yes (comment)     Comments SOB, RPD = 1. Took 2 rest breaks: 1:06 seconds and 30 seconds due to SOB     Resting HR 58 bpm     Resting BP 122/62     Resting Oxygen Saturation  96 %     Exercise Oxygen Saturation  during 6 min walk 92 %     Max Ex. HR 71 bpm     Max Ex. BP 132/72     2 Minute Post BP 110/10            Oxygen Initial Assessment:   Oxygen Re-Evaluation:   Oxygen Discharge (Final Oxygen Re-Evaluation):   Initial Exercise Prescription:  Initial Exercise Prescription - 12/20/19 1300      Date of Initial Exercise RX and Referring Provider   Date 12/20/19    Referring Provider Lauree Chandler, MD    Expected Discharge Date 02/17/20      NuStep   Level 1    SPM 75    Minutes 25    METs 1.8      Prescription Details   Frequency (times per week) 3    Duration Progress to 30 minutes of continuous aerobic without signs/symptoms of physical distress      Intensity   THRR 40-80% of Max Heartrate 51-102    Ratings of Perceived Exertion 11-13    Perceived Dyspnea 0-4      Progression   Progression Continue progressive overload as per policy without signs/symptoms or physical distress.      Resistance Training   Training Prescription Yes    Weight 2 lbs    Reps 10-15           Perform Capillary Blood Glucose checks as needed.  Exercise Prescription Changes:   Exercise Comments:   Exercise Goals and Review:   Exercise Goals    Row Name 12/20/19 1301             Exercise Goals   Increase Physical Activity Yes       Intervention Provide advice, education, support and counseling about physical activity/exercise  needs.;Develop an individualized exercise prescription for aerobic and resistive training based on initial evaluation findings, risk stratification, comorbidities and participant's personal goals.       Expected Outcomes Short Term: Attend rehab on a regular basis to increase amount of physical activity.;Long Term: Add in home exercise to make exercise part of routine and to increase amount of physical activity.;Long Term: Exercising regularly at least 3-5 days a week.       Increase Strength and Stamina Yes       Intervention Provide advice, education, support and counseling about physical activity/exercise needs.;Develop an individualized exercise prescription for aerobic and resistive training based on initial evaluation findings, risk stratification, comorbidities and participant's personal goals.       Expected Outcomes Short Term: Increase workloads from initial exercise prescription for resistance, speed, and METs.;Short Term: Perform resistance training exercises routinely during rehab and add in resistance training at home;Long Term: Improve cardiorespiratory fitness, muscular endurance and strength as measured by increased METs and functional capacity (6MWT)       Able to understand and use rate of perceived exertion (RPE) scale Yes       Intervention Provide education and explanation on how to use RPE scale  Expected Outcomes Short Term: Able to use RPE daily in rehab to express subjective intensity level;Long Term:  Able to use RPE to guide intensity level when exercising independently       Knowledge and understanding of Target Heart Rate Range (THRR) Yes       Intervention Provide education and explanation of THRR including how the numbers were predicted and where they are located for reference       Expected Outcomes Short Term: Able to state/look up THRR;Short Term: Able to use daily as guideline for intensity in rehab;Long Term: Able to use THRR to govern intensity when exercising  independently       Understanding of Exercise Prescription Yes       Intervention Provide education, explanation, and written materials on patient's individual exercise prescription       Expected Outcomes Short Term: Able to explain program exercise prescription;Long Term: Able to explain home exercise prescription to exercise independently              Exercise Goals Re-Evaluation :    Discharge Exercise Prescription (Final Exercise Prescription Changes):   Nutrition:  Target Goals: Understanding of nutrition guidelines, daily intake of sodium 1500mg , cholesterol 200mg , calories 30% from fat and 7% or less from saturated fats, daily to have 5 or more servings of fruits and vegetables.  Biometrics:  Pre Biometrics - 12/20/19 0900      Pre Biometrics   Waist Circumference 39.5 inches    Hip Circumference 41.25 inches    Waist to Hip Ratio 0.96 %    Triceps Skinfold 16 mm    % Body Fat 29.2 %    Grip Strength 21 kg    Flexibility 9.75 in    Single Leg Stand 2.3 seconds   High Fall Risk           Nutrition Therapy Plan and Nutrition Goals:   Nutrition Assessments:   Nutrition Goals Re-Evaluation:   Nutrition Goals Discharge (Final Nutrition Goals Re-Evaluation):   Psychosocial: Target Goals: Acknowledge presence or absence of significant depression and/or stress, maximize coping skills, provide positive support system. Participant is able to verbalize types and ability to use techniques and skills needed for reducing stress and depression.  Initial Review & Psychosocial Screening:  Initial Psych Review & Screening - 12/20/19 1401      Initial Review   Current issues with Current Stress Concerns    Source of Stress Concerns Chronic Illness;Unable to perform yard/household activities      [place]? Yes   Jamill has his wife [name] for support     Barriers   Psychosocial barriers to participate in program There are no identifiable  barriers or psychosocial needs.      Screening Interventions   Interventions Encouraged to exercise           Quality of Life Scores:  Quality of Life - 12/20/19 1226      Quality of Life   Select Quality of Life      Quality of Life Scores   Health/Function Pre 21.54 %    Socioeconomic Pre 21 %    Psych/Spiritual Pre 26.29 %    Family Pre 26.4 %    GLOBAL Pre 23.17 %          Scores of 19 and below usually indicate a poorer quality of life in these areas.  A difference of  2-3 points is a clinically meaningful difference.  A difference of  2-3 points in the total score of the Quality of Life Index has been associated with significant improvement in overall quality of life, self-image, physical symptoms, and general health in studies assessing change in quality of life.  PHQ-9: Recent Review Flowsheet Data    Depression screen [facility] 2/9 12/20/2019 09/22/2019 08/07/2014 05/01/2014   Decreased Interest 0 0 0 0   Down, Depressed, Hopeless 0 0 0 0   PHQ - 2 Score 0 0 0 0     Interpretation of Total Score  Total Score Depression Severity:  1-4 = Minimal depression, 5-9 = Mild depression, 10-14 = Moderate depression, 15-19 = Moderately severe depression, 20-27 = Severe depression   Psychosocial Evaluation and Intervention:   Psychosocial Re-Evaluation:   Psychosocial Discharge (Final Psychosocial Re-Evaluation):   Vocational Rehabilitation: Provide vocational rehab assistance to qualifying candidates.   Vocational Rehab Evaluation & Intervention:  Vocational Rehab - 12/20/19 1402      Initial Vocational Rehab Evaluation & Intervention   Assessment shows need for Vocational Rehabilitation No      Vocational Rehab Re-Evaulation   Comments Xai is retired and does not need vocational rehab at this time           Education: Education Goals: Education classes will be provided on a weekly basis, covering required topics. Participant will state understanding/return  demonstration of topics presented.  Learning Barriers/Preferences:  Learning Barriers/Preferences - 12/20/19 1227      Learning Barriers/Preferences   Learning Barriers Sight;Hearing   Wears glasses and hearing aids   Learning Preferences None           Education Topics: Hypertension, Hypertension Reduction -Define heart disease and high blood pressure. Discus how high blood pressure affects the body and ways to reduce high blood pressure.   Exercise and Your Heart -Discuss why it is important to exercise, the FITT principles of exercise, normal and abnormal responses to exercise, and how to exercise safely.   Angina -Discuss definition of angina, causes of angina, treatment of angina, and how to decrease risk of having angina.   Cardiac Medications -Review what the following cardiac medications are used for, how they affect the body, and side effects that may occur when taking the medications.  Medications include Aspirin, Beta blockers, calcium channel blockers, ACE Inhibitors, angiotensin receptor blockers, diuretics, digoxin, and antihyperlipidemics.   Congestive Heart Failure -Discuss the definition of CHF, how to live with CHF, the signs and symptoms of CHF, and how keep track of weight and sodium intake.   Heart Disease and Intimacy -Discus the effect sexual activity has on the heart, how changes occur during intimacy as we age, and safety during sexual activity.   Smoking Cessation / COPD -Discuss different methods to quit smoking, the health benefits of quitting smoking, and the definition of COPD.   Nutrition I: Fats -Discuss the types of cholesterol, what cholesterol does to the heart, and how cholesterol levels can be controlled.   Nutrition II: Labels -Discuss the different components of food labels and how to read food label   Heart Parts/Heart Disease and PAD -Discuss the anatomy of the heart, the pathway of blood circulation through the heart, and  these are affected by heart disease.   Stress I: Signs and Symptoms -Discuss the causes of stress, how stress may lead to anxiety and depression, and ways to limit stress.   Stress II: Relaxation -Discuss different types of relaxation techniques to limit stress.   Warning Signs of Stroke / TIA -Discuss definition  of a stroke, what the signs and symptoms are of a stroke, and how to identify when someone is having stroke.   Knowledge Questionnaire Score:  Knowledge Questionnaire Score - 12/20/19 1228      Knowledge Questionnaire Score   Pre Score 19/24           Core Components/Risk Factors/Patient Goals at Admission:  Personal Goals and Risk Factors at Admission - 12/20/19 1542      Core Components/Risk Factors/Patient Goals on Admission    Weight Management Weight Maintenance;Yes    Intervention Weight Management: Develop a combined nutrition and exercise program designed to reach desired caloric intake, while maintaining appropriate intake of nutrient and fiber, sodium and fats, and appropriate energy expenditure required for the weight goal.;Weight Management: Provide education and appropriate resources to help participant work on and attain dietary goals.    Expected Outcomes Short Term: Continue to assess and modify interventions until short term weight is achieved;Long Term: Adherence to nutrition and physical activity/exercise program aimed toward attainment of established weight goal;Weight Maintenance: Understanding of the daily nutrition guidelines, which includes 25-35% calories from fat, 7% or less cal from saturated fats, less than 200mg  cholesterol, less than 1.5gm of sodium, & 5 or more servings of fruits and vegetables daily;Understanding recommendations for meals to include 15-35% energy as protein, 25-35% energy from fat, 35-60% energy from carbohydrates, less than 200mg  of dietary cholesterol, 20-35 gm of total fiber daily;Understanding of distribution of calorie  intake throughout the day with the consumption of 4-5 meals/snacks;Weight Gain: Understanding of general recommendations for a high calorie, high protein meal plan that promotes weight gain by distributing calorie intake throughout the day with the consumption for 4-5 meals, snacks, and/or supplements    Hypertension Yes    Intervention Provide education on lifestyle modifcations including regular physical activity/exercise, weight management, moderate sodium restriction and increased consumption of fresh fruit, vegetables, and low fat dairy, alcohol moderation, and smoking cessation.;Monitor prescription use compliance.    Expected Outcomes Short Term: Continued assessment and intervention until BP is < 140/76mm HG in hypertensive participants. < 130/17mm HG in hypertensive participants with diabetes, heart failure or chronic kidney disease.;Long Term: Maintenance of blood pressure at goal levels.    Lipids Yes    Intervention Provide education and support for participant on nutrition & aerobic/resistive exercise along with prescribed medications to achieve LDL 70mg , HDL >40mg .    Expected Outcomes Short Term: Participant states understanding of desired cholesterol values and is compliant with medications prescribed. Participant is following exercise prescription and nutrition guidelines.;Long Term: Cholesterol controlled with medications as prescribed, with individualized exercise RX and with personalized nutrition plan. Value goals: LDL < 70mg , HDL > 40 mg.    Stress Yes    Intervention Offer individual and/or small group education and counseling on adjustment to heart disease, stress management and health-related lifestyle change. Teach and support self-help strategies.;Refer participants experiencing significant psychosocial distress to appropriate mental health specialists for further evaluation and treatment. When possible, include family members and significant others in education/counseling  sessions.    Expected Outcomes Short Term: Participant demonstrates changes in health-related behavior, relaxation and other stress management skills, ability to obtain effective social support, and compliance with psychotropic medications if prescribed.;Long Term: Emotional wellbeing is indicated by absence of clinically significant psychosocial distress or social isolation.           Core Components/Risk Factors/Patient Goals Review:    Core Components/Risk Factors/Patient Goals at Discharge (Final Review):    ITP Comments:  ITP Comments    Row Name 12/20/19 1333           ITP Comments Dr Fransico Him MD, Medical Director              Comments:Asahd attended orientation on 12/20/2019 to review rules and guidelines for program.  Completed 6 minute walk test, Intitial ITP, and exercise prescription.  VSS. Telemetry-Sinus Rhythm, Bundle branch Block. Mr Bulthuis is deconditioned and wears bilateral hearing aides. Patient stopped twice during the walk test due to fatigue and complained of mild shortness of breath.  Safety measures and social distancing in place per CDC guidelines.Barnet Pall, RN,BSN 12/20/2019 3:55 PM

## 2019-12-23 ENCOUNTER — Other Ambulatory Visit: Payer: Self-pay | Admitting: *Deleted

## 2019-12-23 NOTE — Patient Outreach (Signed)
[name]) Care Management  12/23/2019  [name] Mar 04, 1923 [id number]   Telephone Assessment-Unsuccessful  RN attempted outreach call today however unsuccessful. RN able to leave a HIPAA approved message requesting a call back.   Will reschedule another outreach call over the next week.  [name], RN Care Management Coordinator [place] [facility] [phone]

## 2019-12-26 ENCOUNTER — Encounter (HOSPITAL_COMMUNITY)
Admission: RE | Admit: 2019-12-26 | Discharge: 2019-12-26 | Disposition: A | Discharge status: Home / Self Care | Payer: Medicare Other | Source: Ambulatory Visit | Attending: Interventional Cardiology | Admitting: Interventional Cardiology

## 2019-12-26 ENCOUNTER — Other Ambulatory Visit: Payer: Self-pay

## 2019-12-26 ENCOUNTER — Inpatient Hospital Stay: Payer: Medicare Other

## 2019-12-26 DIAGNOSIS — D693 Immune thrombocytopenic purpura: Secondary | ICD-10-CM

## 2019-12-26 DIAGNOSIS — Z952 Presence of prosthetic heart valve: Secondary | ICD-10-CM | POA: Diagnosis not present

## 2019-12-26 DIAGNOSIS — R0609 Other forms of dyspnea: Secondary | ICD-10-CM | POA: Diagnosis not present

## 2019-12-26 DIAGNOSIS — D696 Thrombocytopenia, unspecified: Secondary | ICD-10-CM | POA: Diagnosis not present

## 2019-12-26 DIAGNOSIS — Z23 Encounter for immunization: Secondary | ICD-10-CM | POA: Diagnosis not present

## 2019-12-26 LAB — CBC WITH DIFFERENTIAL (CANCER CENTER ONLY)
Abs Immature Granulocytes: 0.09 10*3/uL — ABNORMAL HIGH (ref 0.00–0.07)
Basophils Absolute: 0.1 10*3/uL (ref 0.0–0.1)
Basophils Relative: 2 %
Eosinophils Absolute: 0.4 10*3/uL (ref 0.0–0.5)
Eosinophils Relative: 5 %
HCT: 36.5 % — ABNORMAL LOW (ref 39.0–52.0)
Hemoglobin: 11.4 g/dL — ABNORMAL LOW (ref 13.0–17.0)
Immature Granulocytes: 1 %
Lymphocytes Relative: 23 %
Lymphs Abs: 2 10*3/uL (ref 0.7–4.0)
MCH: 25.7 pg — ABNORMAL LOW (ref 26.0–34.0)
MCHC: 31.2 g/dL (ref 30.0–36.0)
MCV: 82.4 fL (ref 80.0–100.0)
Monocytes Absolute: 1.9 10*3/uL — ABNORMAL HIGH (ref 0.1–1.0)
Monocytes Relative: 22 %
Neutro Abs: 4.1 10*3/uL (ref 1.7–7.7)
Neutrophils Relative %: 47 %
Platelet Count: 84 10*3/uL — ABNORMAL LOW (ref 150–400)
RBC: 4.43 MIL/uL (ref 4.22–5.81)
RDW: 18.4 % — ABNORMAL HIGH (ref 11.5–15.5)
WBC Count: 8.7 10*3/uL (ref 4.0–10.5)
nRBC: 0 % (ref 0.0–0.2)

## 2019-12-26 NOTE — Progress Notes (Signed)
Cardiac Individual Treatment Plan  Patient Details  Name: [name] MRN: [id number] Date of Birth: 21-Jan-1924 Referring Provider:     [place] from 12/20/2019 in [facility]  Referring Provider Lauree Chandler, MD      Initial Encounter Date:    CARDIAC REHAB PHASE II ORIENTATION from 12/20/2019 in [place]  Date 12/20/19      Visit Diagnosis: S/P TAVR (transcatheter aortic valve replacement) 09/06/19  Patient's Home Medications on Admission:  Current Outpatient Medications:  .  acetaminophen (TYLENOL) 500 MG tablet, Take 1,000 mg by mouth every 8 (eight) hours as needed for moderate pain., Disp: , Rfl:  .  amoxicillin (AMOXIL) 500 MG tablet, Take 4 capsules (2,000 mg) one hour prior to all dental visits., Disp: 8 tablet, Rfl: 11 .  aspirin EC 81 MG EC tablet, Take 1 tablet (81 mg total) by mouth daily. Swallow whole., Disp: 30 tablet, Rfl: 11 .  atorvastatin (LIPITOR) 20 MG tablet, TAKE 1 TABLET BY MOUTH DAILY AT 6 PM (Patient taking differently: Take 20 mg by mouth daily at 6 PM. ), Disp: 90 tablet, Rfl: 3 .  Cholecalciferol (VITAMIN D3) 50 MCG (2000 UT) TABS, Take 2,000 Units by mouth every evening., Disp: , Rfl:  .  desonide (DESOWEN) 0.05 % cream, Apply 1 application topically 2 (two) times daily as needed (rash/irritation.). , Disp: , Rfl:  .  finasteride (PROSCAR) 5 MG tablet, Take 5 mg by mouth daily., Disp: , Rfl:  .  fluocinonide (LIDEX) 0.05 % external solution, Apply 1 application topically 2 (two) times daily as needed (apply to ears).  (Patient not taking: Reported on 12/19/2019), Disp: , Rfl: 11 .  furosemide (LASIX) 40 MG tablet, Take 0.5 tablets (20 mg total) by mouth daily., Disp: , Rfl:  .  latanoprost (XALATAN) 0.005 % ophthalmic solution, Place 1 drop into both eyes at bedtime. , Disp: , Rfl:  .  metoprolol tartrate (LOPRESSOR) 25 MG tablet, Take 0.5 tablets (12.5 mg  total) by mouth daily., Disp: 90 tablet, Rfl: 2 .  Multiple Vitamins-Minerals (PRESERVISION AREDS 2 PO), Take 1 capsule by mouth 2 (two) times daily. , Disp: , Rfl:  .  nitroGLYCERIN (NITROSTAT) 0.4 MG SL tablet, Place 1 tablet (0.4 mg total) under the tongue every 5 (five) minutes as needed for chest pain. Please make yearly appt with Dr. Tamala Julian for September. 1st attempt, Disp: 25 tablet, Rfl: 2 .  pantoprazole (PROTONIX) 40 MG tablet, TAKE 1 TABLET BY MOUTH DAILY GENERIC EQUIVALENT FOR PROTONIX (Patient taking differently: Take 40 mg by mouth daily. ), Disp: 90 tablet, Rfl: 2 .  potassium chloride (KLOR-CON) 10 MEQ tablet, Take 1 tablet (10 mEq total) by mouth daily., Disp: 90 tablet, Rfl: 3 .  ranibizumab (LUCENTIS) 0.5 MG/0.05ML SOLN, 0.5 mg by Intravitreal route every 6 (six) weeks. Left Eye ONLY, Disp: , Rfl:  .  sertraline (ZOLOFT) 50 MG tablet, Take 50 mg by mouth every evening. , Disp: , Rfl:  .  tamsulosin (FLOMAX) 0.4 MG CAPS capsule, Take 0.8 mg by mouth daily after supper. , Disp: , Rfl:  .  timolol (BETIMOL) 0.5 % ophthalmic solution, Place 1 drop into the right eye daily. , Disp: , Rfl:   Past Medical History: Past Medical History:  Diagnosis Date  . Acute appendicitis   . Anemia   . Anxiety   . Arthritis    "back" (03/14/2014)  . Blood dyscrasia  hairy cell leukemia  . CAD (coronary artery disease)    a. 03/14/14  s/p overlapping DES x2 to mid-distal RCA.  . 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 Colon cancer ([place]) 1984  . Compression fracture of lumbar spine, non-traumatic ([place])   . DJD (degenerative joint disease) of lumbar spine   . Dyslipidemia   . Dyspnea   . Elevated PSA   . GERD (gastroesophageal reflux disease)   . Glaucoma   . Hairy cell leukemia ([place]) dx'd 1980  . Heart murmur   . History of blood transfusion    "several; related to hairy cell leukemia & tx "  . History of stomach ulcers 1968  . Hypertension   . Leukemia, hairy cell  ([place])   . Malnutrition ([place])   . Osteoarthritis   . Osteoporosis   . Pneumonia   . 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 Angelena Form and Cyndia Bent.   . Severe aortic stenosis    s/p tavr  . Skin cancer of face     Tobacco Use: Social History   Tobacco Use  Smoking Status Former Smoker  . Packs/day: 0.25  . Years: 1.00  . Pack years: 0.25  . Types: Cigarettes, Cigars  Smokeless Tobacco Never Used  Tobacco Comment   occasional social smoker during college.    Labs: Recent Review Flowsheet Data    Labs for ITP Cardiac and Pulmonary Rehab Latest Ref Rng & Units 09/06/2019 09/06/2019 09/06/2019 09/16/2019 09/17/2019   Cholestrol 0 - 200 mg/dL - - - - 96   LDLCALC 0 - 99 mg/dL - - - - 43   HDL >40 mg/dL - - - - 30(L)   Trlycerides <150 mg/dL - - - - 115   Hemoglobin A1c 4.8 - 5.6 % - - - - 6.6(H)   PHART 7.35 - 7.45 - - - - -   PCO2ART 32 - 48 mmHg - - - - -   HCO3 20.0 - 28.0 mmol/L - - - - -   TCO2 22 - 32 mmol/L 24 26 22 28  -   ACIDBASEDEF 0.0 - 2.0 mmol/L - - - - -   O2SAT % - - - - -      Capillary Blood Glucose: Lab Results  Component Value Date   GLUCAP 78 09/16/2019     Exercise Target Goals: Exercise Program Goal: Individual exercise prescription set using results from initial 6 min walk test and THRR while considering  patient's activity barriers and safety.   Exercise Prescription Goal: Starting with aerobic activity 30 plus minutes a day, 3 days per week for initial exercise prescription. Provide home exercise prescription and guidelines that participant acknowledges understanding prior to discharge.  Activity Barriers & Risk Stratification:  Activity Barriers & Cardiac Risk Stratification - 12/20/19 1259      Activity Barriers & Cardiac Risk Stratification   Activity Barriers Balance Concerns;Deconditioning;Shortness of Breath    Cardiac Risk Stratification High            6 Minute Walk:  6 Minute Walk    Row Name 12/20/19 1101         6 Minute Walk   Phase Initial     Distance 810 feet     Walk Time 6 minutes     # of Rest Breaks 2     MPH 1.5     METS 0.4  RPE 11     Perceived Dyspnea  1     VO2 Peak 1.43     Symptoms Yes (comment)     Comments SOB, RPD = 1. Took 2 rest breaks: 1:06 seconds and 30 seconds due to SOB     Resting HR 58 bpm     Resting BP 122/62     Resting Oxygen Saturation  96 %     Exercise Oxygen Saturation  during 6 min walk 92 %     Max Ex. HR 71 bpm     Max Ex. BP 132/72     2 Minute Post BP 110/10            Oxygen Initial Assessment:   Oxygen Re-Evaluation:   Oxygen Discharge (Final Oxygen Re-Evaluation):   Initial Exercise Prescription:  Initial Exercise Prescription - 12/20/19 1300      Date of Initial Exercise RX and Referring Provider   Date 12/20/19    Referring Provider Lauree Chandler, MD    Expected Discharge Date 02/17/20      NuStep   Level 1    SPM 75    Minutes 25    METs 1.8      Prescription Details   Frequency (times per week) 3    Duration Progress to 30 minutes of continuous aerobic without signs/symptoms of physical distress      Intensity   THRR 40-80% of Max Heartrate 51-102    Ratings of Perceived Exertion 11-13    Perceived Dyspnea 0-4      Progression   Progression Continue progressive overload as per policy without signs/symptoms or physical distress.      Resistance Training   Training Prescription Yes    Weight 2 lbs    Reps 10-15           Perform Capillary Blood Glucose checks as needed.  Exercise Prescription Changes:   Exercise Prescription Changes    Row Name 12/26/19 1000             Response to Exercise   Blood Pressure (Admit) 110/62       Blood Pressure (Exercise) 118/62       Blood Pressure (Exit) 114/70       Heart Rate (Admit) 61 bpm       Heart Rate (Exercise) 69 bpm       Heart Rate (Exit) 60 bpm       Rating of  Perceived Exertion (Exercise) 11       Perceived Dyspnea (Exercise) 0       Symptoms None       Comments Pt's first day of exercise       Duration Progress to 10 minutes continuous walking  at current work load and total walking time to 30-45 min       Intensity THRR unchanged         Progression   Progression Continue to progress workloads to maintain intensity without signs/symptoms of physical distress.       Average METs 1.8         Resistance Training   Training Prescription Yes       Weight 2lbs       Reps 10-15       Time 10 Minutes         Interval Training   Interval Training No         NuStep   Level 1       SPM 75  Minutes 25       METs 1.8              Exercise Comments:   Exercise Comments    Row Name 12/26/19 1040           Exercise Comments Pt's first day of exercise. Pt responded well to exercise prescription. Will continue to monitor and progress pt as tolerated.              Exercise Goals and Review:   Exercise Goals    Row Name 12/20/19 1301             Exercise Goals   Increase Physical Activity Yes       Intervention Provide advice, education, support and counseling about physical activity/exercise needs.;Develop an individualized exercise prescription for aerobic and resistive training based on initial evaluation findings, risk stratification, comorbidities and participant's personal goals.       Expected Outcomes Short Term: Attend rehab on a regular basis to increase amount of physical activity.;Long Term: Add in home exercise to make exercise part of routine and to increase amount of physical activity.;Long Term: Exercising regularly at least 3-5 days a week.       Increase Strength and Stamina Yes       Intervention Provide advice, education, support and counseling about physical activity/exercise needs.;Develop an individualized exercise prescription for aerobic and resistive training based on initial evaluation findings, risk  stratification, comorbidities and participant's personal goals.       Expected Outcomes Short Term: Increase workloads from initial exercise prescription for resistance, speed, and METs.;Short Term: Perform resistance training exercises routinely during rehab and add in resistance training at home;Long Term: Improve cardiorespiratory fitness, muscular endurance and strength as measured by increased METs and functional capacity (6MWT)       Able to understand and use rate of perceived exertion (RPE) scale Yes       Intervention Provide education and explanation on how to use RPE scale       Expected Outcomes Short Term: Able to use RPE daily in rehab to express subjective intensity level;Long Term:  Able to use RPE to guide intensity level when exercising independently       Knowledge and understanding of Target Heart Rate Range (THRR) Yes       Intervention Provide education and explanation of THRR including how the numbers were predicted and where they are located for reference       Expected Outcomes Short Term: Able to state/look up THRR;Short Term: Able to use daily as guideline for intensity in rehab;Long Term: Able to use THRR to govern intensity when exercising independently       Understanding of Exercise Prescription Yes       Intervention Provide education, explanation, and written materials on patient's individual exercise prescription       Expected Outcomes Short Term: Able to explain program exercise prescription;Long Term: Able to explain home exercise prescription to exercise independently              Exercise Goals Re-Evaluation :  Exercise Goals Re-Evaluation    Row Name 12/26/19 1040             Exercise Goal Re-Evaluation   Exercise Goals Review Able to understand and use rate of perceived exertion (RPE) scale;Knowledge and understanding of Target Heart Rate Range (THRR);Understanding of Exercise Prescription       Comments Pt's first day of exercise. Pt was able to  exercise for 30  minutes with minimal difficulty. Will continue to monitor.       Expected Outcomes Pt will continue to increase his strength and stamina.               Discharge Exercise Prescription (Final Exercise Prescription Changes):  Exercise Prescription Changes - 12/26/19 1000      Response to Exercise   Blood Pressure (Admit) 110/62    Blood Pressure (Exercise) 118/62    Blood Pressure (Exit) 114/70    Heart Rate (Admit) 61 bpm    Heart Rate (Exercise) 69 bpm    Heart Rate (Exit) 60 bpm    Rating of Perceived Exertion (Exercise) 11    Perceived Dyspnea (Exercise) 0    Symptoms None    Comments Pt's first day of exercise    Duration Progress to 10 minutes continuous walking  at current work load and total walking time to 30-45 min    Intensity THRR unchanged      Progression   Progression Continue to progress workloads to maintain intensity without signs/symptoms of physical distress.    Average METs 1.8      Resistance Training   Training Prescription Yes    Weight 2lbs    Reps 10-15    Time 10 Minutes      Interval Training   Interval Training No      NuStep   Level 1    SPM 75    Minutes 25    METs 1.8           Nutrition:  Target Goals: Understanding of nutrition guidelines, daily intake of sodium 1500mg , cholesterol 200mg , calories 30% from fat and 7% or less from saturated fats, daily to have 5 or more servings of fruits and vegetables.  Biometrics:  Pre Biometrics - 12/20/19 0900      Pre Biometrics   Waist Circumference 39.5 inches    Hip Circumference 41.25 inches    Waist to Hip Ratio 0.96 %    Triceps Skinfold 16 mm    % Body Fat 29.2 %    Grip Strength 21 kg    Flexibility 9.75 in    Single Leg Stand 2.3 seconds   High Fall Risk           Nutrition Therapy Plan and Nutrition Goals:   Nutrition Assessments:   Nutrition Goals Re-Evaluation:   Nutrition Goals Discharge (Final Nutrition Goals  Re-Evaluation):   Psychosocial: Target Goals: Acknowledge presence or absence of significant depression and/or stress, maximize coping skills, provide positive support system. Participant is able to verbalize types and ability to use techniques and skills needed for reducing stress and depression.  Initial Review & Psychosocial Screening:  Initial Psych Review & Screening - 12/20/19 1401      Initial Review   Current issues with Current Stress Concerns    Source of Stress Concerns Chronic Illness;Unable to perform yard/household activities      [place]? Yes   [place] has his wife [name] for support     Barriers   Psychosocial barriers to participate in program There are no identifiable barriers or psychosocial needs.      Screening Interventions   Interventions Encouraged to exercise           Quality of Life Scores:  Quality of Life - 12/20/19 1226      Quality of Life   Select Quality of Life      Quality of Life Scores  Health/Function Pre 21.54 %    Socioeconomic Pre 21 %    Psych/Spiritual Pre 26.29 %    Family Pre 26.4 %    GLOBAL Pre 23.17 %          Scores of 19 and below usually indicate a poorer quality of life in these areas.  A difference of  2-3 points is a clinically meaningful difference.  A difference of 2-3 points in the total score of the Quality of Life Index has been associated with significant improvement in overall quality of life, self-image, physical symptoms, and general health in studies assessing change in quality of life.  PHQ-9: Recent Review Flowsheet Data    Depression screen [facility] 2/9 12/20/2019 09/22/2019 08/07/2014 05/01/2014   Decreased Interest 0 0 0 0   Down, Depressed, Hopeless 0 0 0 0   PHQ - 2 Score 0 0 0 0     Interpretation of Total Score  Total Score Depression Severity:  1-4 = Minimal depression, 5-9 = Mild depression, 10-14 = Moderate depression, 15-19 = Moderately severe depression, 20-27 =  Severe depression   Psychosocial Evaluation and Intervention:   Psychosocial Re-Evaluation:   Psychosocial Discharge (Final Psychosocial Re-Evaluation):   Vocational Rehabilitation: Provide vocational rehab assistance to qualifying candidates.   Vocational Rehab Evaluation & Intervention:  Vocational Rehab - 12/20/19 1402      Initial Vocational Rehab Evaluation & Intervention   Assessment shows need for Vocational Rehabilitation No      Vocational Rehab Re-Evaulation   Comments Jerrick is retired and does not need vocational rehab at this time           Education: Education Goals: Education classes will be provided on a weekly basis, covering required topics. Participant will state understanding/return demonstration of topics presented.  Learning Barriers/Preferences:  Learning Barriers/Preferences - 12/20/19 1227      Learning Barriers/Preferences   Learning Barriers Sight;Hearing   Wears glasses and hearing aids   Learning Preferences None           Education Topics: Hypertension, Hypertension Reduction -Define heart disease and high blood pressure. Discus how high blood pressure affects the body and ways to reduce high blood pressure.   Exercise and Your Heart -Discuss why it is important to exercise, the FITT principles of exercise, normal and abnormal responses to exercise, and how to exercise safely.   Angina -Discuss definition of angina, causes of angina, treatment of angina, and how to decrease risk of having angina.   Cardiac Medications -Review what the following cardiac medications are used for, how they affect the body, and side effects that may occur when taking the medications.  Medications include Aspirin, Beta blockers, calcium channel blockers, ACE Inhibitors, angiotensin receptor blockers, diuretics, digoxin, and antihyperlipidemics.   Congestive Heart Failure -Discuss the definition of CHF, how to live with CHF, the signs and symptoms of  CHF, and how keep track of weight and sodium intake.   Heart Disease and Intimacy -Discus the effect sexual activity has on the heart, how changes occur during intimacy as we age, and safety during sexual activity.   Smoking Cessation / COPD -Discuss different methods to quit smoking, the health benefits of quitting smoking, and the definition of COPD.   Nutrition I: Fats -Discuss the types of cholesterol, what cholesterol does to the heart, and how cholesterol levels can be controlled.   Nutrition II: Labels -Discuss the different components of food labels and how to read food label   Heart Parts/Heart Disease  and PAD -Discuss the anatomy of the heart, the pathway of blood circulation through the heart, and these are affected by heart disease.   Stress I: Signs and Symptoms -Discuss the causes of stress, how stress may lead to anxiety and depression, and ways to limit stress.   Stress II: Relaxation -Discuss different types of relaxation techniques to limit stress.   Warning Signs of Stroke / TIA -Discuss definition of a stroke, what the signs and symptoms are of a stroke, and how to identify when someone is having stroke.   Knowledge Questionnaire Score:  Knowledge Questionnaire Score - 12/20/19 1228      Knowledge Questionnaire Score   Pre Score 19/24           Core Components/Risk Factors/Patient Goals at Admission:  Personal Goals and Risk Factors at Admission - 12/20/19 1542      Core Components/Risk Factors/Patient Goals on Admission    Weight Management Weight Maintenance;Yes    Intervention Weight Management: Develop a combined nutrition and exercise program designed to reach desired caloric intake, while maintaining appropriate intake of nutrient and fiber, sodium and fats, and appropriate energy expenditure required for the weight goal.;Weight Management: Provide education and appropriate resources to help participant work on and attain dietary goals.     Expected Outcomes Short Term: Continue to assess and modify interventions until short term weight is achieved;Long Term: Adherence to nutrition and physical activity/exercise program aimed toward attainment of established weight goal;Weight Maintenance: Understanding of the daily nutrition guidelines, which includes 25-35% calories from fat, 7% or less cal from saturated fats, less than 200mg  cholesterol, less than 1.5gm of sodium, & 5 or more servings of fruits and vegetables daily;Understanding recommendations for meals to include 15-35% energy as protein, 25-35% energy from fat, 35-60% energy from carbohydrates, less than 200mg  of dietary cholesterol, 20-35 gm of total fiber daily;Understanding of distribution of calorie intake throughout the day with the consumption of 4-5 meals/snacks;Weight Gain: Understanding of general recommendations for a high calorie, high protein meal plan that promotes weight gain by distributing calorie intake throughout the day with the consumption for 4-5 meals, snacks, and/or supplements    Hypertension Yes    Intervention Provide education on lifestyle modifcations including regular physical activity/exercise, weight management, moderate sodium restriction and increased consumption of fresh fruit, vegetables, and low fat dairy, alcohol moderation, and smoking cessation.;Monitor prescription use compliance.    Expected Outcomes Short Term: Continued assessment and intervention until BP is < 140/6mm HG in hypertensive participants. < 130/63mm HG in hypertensive participants with diabetes, heart failure or chronic kidney disease.;Long Term: Maintenance of blood pressure at goal levels.    Lipids Yes    Intervention Provide education and support for participant on nutrition & aerobic/resistive exercise along with prescribed medications to achieve LDL 70mg , HDL >40mg .    Expected Outcomes Short Term: Participant states understanding of desired cholesterol values and is compliant  with medications prescribed. Participant is following exercise prescription and nutrition guidelines.;Long Term: Cholesterol controlled with medications as prescribed, with individualized exercise RX and with personalized nutrition plan. Value goals: LDL < 70mg , HDL > 40 mg.    Stress Yes    Intervention Offer individual and/or small group education and counseling on adjustment to heart disease, stress management and health-related lifestyle change. Teach and support self-help strategies.;Refer participants experiencing significant psychosocial distress to appropriate mental health specialists for further evaluation and treatment. When possible, include family members and significant others in education/counseling sessions.    Expected Outcomes Short  Term: Participant demonstrates changes in health-related behavior, relaxation and other stress management skills, ability to obtain effective social support, and compliance with psychotropic medications if prescribed.;Long Term: Emotional wellbeing is indicated by absence of clinically significant psychosocial distress or social isolation.           Core Components/Risk Factors/Patient Goals Review:   Goals and Risk Factor Review    Row Name 12/26/19 1457             Core Components/Risk Factors/Patient Goals Review   Personal Goals Review Weight Management/Obesity;Lipids;Hypertension;Stress       Review [place] started exercise on 12/26/19. VSS. Patient did well with exercise       Expected Outcomes [place] will continue to participate in phase 2 exercise for exercise, nutrition and lifestyle modifications              Core Components/Risk Factors/Patient Goals at Discharge (Final Review):   Goals and Risk Factor Review - 12/26/19 1457      Core Components/Risk Factors/Patient Goals Review   Personal Goals Review Weight Management/Obesity;Lipids;Hypertension;Stress    Review [place] started exercise on 12/26/19. VSS. Patient did well with  exercise    Expected Outcomes [place] will continue to participate in phase 2 exercise for exercise, nutrition and lifestyle modifications           ITP Comments:  ITP Comments    Row Name 12/20/19 1333 12/26/19 1449         ITP Comments Dr Fransico Him MD, Medical Director 30 Day ITP Review. Patient started exercise on 12/26/19 and did well with exercise             Comments: See ITP comments.Barnet Pall, RN,BSN 12/29/2019 1:06 PM.

## 2019-12-26 NOTE — Progress Notes (Addendum)
Daily Session Note  Patient Details  Name: [name] MRN: [id number] Date of Birth: 02-Nov-1923 Referring Provider:     [place] from 12/20/2019 in [place]  Referring Provider Douglas Chandler, MD      Encounter Date: 12/26/2019  Check In:  Session Check In - 12/26/19 0917      Check-In   Supervising physician immediately available to respond to emergencies [facility] immediately available    Physician(s) Dr. Maylene Roes    Location [facility]    Staff Present Lesly Rubenstein, MS, EP-C, CCRP;Tabius Rood, RN, BSN;Carlette Carlton, RN, Mosie Epstein, MS,ACSM CEP, Exercise Physiologist;Olinty Celesta Aver, MS, ACSM CEP, Exercise Physiologist    Virtual Visit No    Medication changes reported     No    Fall or balance concerns reported    No    Tobacco Cessation No Change    Current number of cigarettes/nicotine per day     0    Warm-up and Cool-down Performed on first and last piece of equipment    Resistance Training Performed Yes    VAD Patient? No    PAD/SET Patient? No      Pain Assessment   Currently in Pain? No/denies    Pain Score 0-No pain    Multiple Pain Sites No           Capillary Blood Glucose: No results found for this or any previous visit (from the past 24 hour(s)).   Exercise Prescription Changes - 12/26/19 1000      Response to Exercise   Blood Pressure (Admit) 110/62    Blood Pressure (Exercise) 118/62    Blood Pressure (Exit) 114/70    Heart Rate (Admit) 61 bpm    Heart Rate (Exercise) 69 bpm    Heart Rate (Exit) 60 bpm    Rating of Perceived Exertion (Exercise) 11    Perceived Dyspnea (Exercise) 0    Symptoms None    Comments Pt's first day of exercise    Duration Progress to 10 minutes continuous walking  at current work load and total walking time to 30-45 min    Intensity THRR unchanged      Progression   Progression Continue to progress workloads to  maintain intensity without signs/symptoms of physical distress.    Average METs 1.8      Resistance Training   Training Prescription Yes    Weight 2lbs    Reps 10-15    Time 10 Minutes      Interval Training   Interval Training No      NuStep   Level 1    SPM 75    Minutes 25    METs 1.8           Social History   Tobacco Use  Smoking Status Former Smoker  . Packs/day: 0.25  . Years: 1.00  . Pack years: 0.25  . Types: Cigarettes, Cigars  Smokeless Tobacco Never Used  Tobacco Comment   occasional social smoker during college.    Goals Met:  Exercise tolerated well No report of cardiac concerns or symptoms Strength training completed today  Goals Unmet:  Not Applicable  Comments: [name] started cardiac rehab today.  Pt tolerated light exercise without difficulty. VSS, telemetry-Sinus Rhythm, asymptomatic.  Medication list reconciled. Pt denies barriers to medicaiton compliance.  PSYCHOSOCIAL ASSESSMENT:  PHQ-0. Pt exhibits positive coping skills, hopeful outlook with supportive family. No psychosocial needs identified at this time,  no psychosocial interventions necessary.    Pt enjoys boating.   Pt oriented to exercise equipment and routine.    Understanding verbalized.Barnet Pall, RN,BSN 12/26/2019 3:07 PM   Dr. Fransico Him is Medical Director for Cardiac Rehab at [facility].

## 2019-12-28 ENCOUNTER — Encounter (HOSPITAL_COMMUNITY)
Admission: RE | Admit: 2019-12-28 | Discharge: 2019-12-28 | Disposition: A | Discharge status: Home / Self Care | Payer: Medicare Other | Source: Ambulatory Visit | Attending: Interventional Cardiology | Admitting: Interventional Cardiology

## 2019-12-28 ENCOUNTER — Other Ambulatory Visit: Payer: Self-pay | Admitting: *Deleted

## 2019-12-28 ENCOUNTER — Other Ambulatory Visit: Payer: Self-pay

## 2019-12-28 DIAGNOSIS — Z952 Presence of prosthetic heart valve: Secondary | ICD-10-CM

## 2019-12-28 NOTE — Patient Outreach (Signed)
[facility]) Care Management  12/28/2019  ADRIANE GUGLIELMO 1923-09-07 [id number]    Telephone Assessment-Successful-HTN  RN spoke with pt's spouse today [name]) who provided an update on pt's ongoing care. States pt doing well with his mobility and has gone from HHPT to out-patient cardiac rehab (next f/u on Monday). States is participating and improving very well. Pt wean from the Prednisone therapy and has not needed the Platelet injections over the last 4 weeks maintaining his levels. Recent Follow up with Dr. Philip Aspen was good and no medication changes. Pt continue to recover with no acute symptoms or issues. 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   RN discussed the option for a Health Coach since pt is doing so well however spouse [name]) has requested ongoing complex with this RN case management for the new few months. Will review and discuss in detail the plan of care along with all goals and interventions along with ongoing encouragement on pt's ongoing monitoring with his home device for blood pressure monitoring.  Will follow up next month with pt's ongoing progress and continue communicating with his provider on pt's progress.  Goals Addressed            This Visit's Progress   . COMPLETED: 11/25/2019 THN update       CARE PLAN ENTRY (see longtitudinal plan of care for additional care plan information)  Objective:  . Last practice recorded BP readings:  BP Readings from Last 3 Encounters:  09/21/19 116/73  09/14/19 (!) 142/74  09/13/19 134/70 .   Marland Kitchen Most recent eGFR/CrCl: No results found for: EGFR  No components found for: CRCL  Current Barriers:  Marland Kitchen Knowledge Deficits related to basic understanding of hypertension diagnosis . Knowledge Deficits related to understanding of medications prescribed for management of hypertension . Knowledge deficit related to dangers of  uncontrolled hypertension  Case Manager Clinical Goal(s):  Marland Kitchen Over the next 90 days, patient will verbalize understanding of plan for hypertension management . Over the next 30 days, patient will attend all scheduled medical appointments: all appointments were reviewed today with verification of sufficient transportation. . Over the next 30 days, patient will demonstrate improved adherence to prescribed treatment plan for hypertension as evidenced by taking all medications as prescribed, monitoring and recording blood pressure as directed, adhering to low sodium/DASH diet . Over the next 30 days, patient will verbalize basic understanding of hypertension disease process and self health management plan as evidenced by report normal blood pressures  Interventions:  . Evaluation of current treatment plan related to hypertension self management and patient's adherence to plan as established by provider. . Reviewed medications with patient and discussed importance of compliance . Discussed plans with patient for ongoing care management follow up and provided patient with direct contact information for care management team . Advised patient, providing education and rationale, to monitor blood pressure daily and record, calling PCP for findings outside established parameters.  . Provided education regarding s/s of stroke and stroke prevention . Provided education regarding s/s of heart attack . Provided education regarding s/s of DASH diet/Low salt diet . Provided education regarding complications of uncontrolled blood pressure . Provided education regarding s/s hypertensive crisis  Patient Self Care Activities:  . Self administers medications as prescribed . Attends all scheduled provider appointments . Calls provider office for new concerns, questions, or BP outside discussed parameters . Monitors BP and records as  discussed . Adheres to a low sodium diet/DASH diet  Initial goal documentation 30  day goals met for adherence to medications and medical appointments. Resolving due to duplicate goals.    . THN-Healthy Eating       Follow Up Date 04/02/2020   - drink 6 to 8 glasses of water each day - limit fast food meals to no more than 1 per week - manage portion size - set a realistic goal - switch to low-fat or skim milk - switch to sugar-free drinks    Why is this important?   When you are ready to manage your nutrition or weight, having a plan and setting goals will help.  Taking small steps to change how you eat and exercise is a good place to start.    Notes:     . THN-Track and Manage My Blood Pressure       Follow Up Date 01/31/2020  - check blood pressure 3 times per week - choose a place to take my blood pressure (home, clinic or office, retail store) - write blood pressure results in a log or diary    Why is this important?   You won't feel high blood pressure, but it can still hurt your blood vessels.  High blood pressure can cause heart or kidney problems. It can also cause a stroke.  Making lifestyle changes like losing a little weight or eating less salt will help.  Checking your blood pressure at home and at different times of the day can help to control blood pressure.  If the doctor prescribes medicine remember to take it the way the doctor ordered.  Call the office if you cannot afford the medicine or if there are questions about it.     Notes:        [name], RN Care Management Coordinator [place] Office [phone]

## 2019-12-30 ENCOUNTER — Encounter (HOSPITAL_COMMUNITY)
Admission: RE | Admit: 2019-12-30 | Discharge: 2019-12-30 | Disposition: A | Discharge status: Home / Self Care | Payer: Medicare Other | Source: Ambulatory Visit | Attending: Interventional Cardiology | Admitting: Interventional Cardiology

## 2019-12-30 ENCOUNTER — Other Ambulatory Visit: Payer: Self-pay

## 2019-12-30 DIAGNOSIS — Z952 Presence of prosthetic heart valve: Secondary | ICD-10-CM | POA: Diagnosis not present

## 2020-01-02 ENCOUNTER — Other Ambulatory Visit: Payer: Self-pay

## 2020-01-02 ENCOUNTER — Encounter (HOSPITAL_COMMUNITY)
Admission: RE | Admit: 2020-01-02 | Discharge: 2020-01-02 | Disposition: A | Discharge status: Home / Self Care | Payer: Medicare Other | Source: Ambulatory Visit | Attending: Interventional Cardiology | Admitting: Interventional Cardiology

## 2020-01-02 DIAGNOSIS — Z952 Presence of prosthetic heart valve: Secondary | ICD-10-CM | POA: Insufficient documentation

## 2020-01-04 ENCOUNTER — Encounter (HOSPITAL_COMMUNITY)
Admission: RE | Admit: 2020-01-04 | Discharge: 2020-01-04 | Disposition: A | Discharge status: Home / Self Care | Payer: Medicare Other | Source: Ambulatory Visit | Attending: Interventional Cardiology | Admitting: Interventional Cardiology

## 2020-01-04 ENCOUNTER — Other Ambulatory Visit: Payer: Self-pay

## 2020-01-04 VITALS — Wt 165.0 lb

## 2020-01-04 DIAGNOSIS — Z952 Presence of prosthetic heart valve: Secondary | ICD-10-CM

## 2020-01-04 NOTE — Progress Notes (Signed)
FENDER HERDER 86 y.o. male Nutrition Note  Visit Diagnosis: S/P TAVR (transcatheter aortic valve replacement) 09/06/19  Past Medical History:  Diagnosis Date  . Acute appendicitis   . Anemia   . Anxiety   . Arthritis    "back" (03/14/2014)  . Blood dyscrasia    hairy cell leukemia  . CAD (coronary artery disease)    a. 03/14/14  s/p overlapping DES x2 to mid-distal RCA.  . 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 Colon cancer ([place]) 1984  . Compression fracture of lumbar spine, non-traumatic ([place])   . DJD (degenerative joint disease) of lumbar spine   . Dyslipidemia   . Dyspnea   . Elevated PSA   . GERD (gastroesophageal reflux disease)   . Glaucoma   . Hairy cell leukemia ([place]) dx'd 1980  . Heart murmur   . History of blood transfusion    "several; related to hairy cell leukemia & tx "  . History of stomach ulcers 1968  . Hypertension   . Leukemia, hairy cell ([place])   . Malnutrition ([place])   . Osteoarthritis   . Osteoporosis   . Pneumonia   . 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 Angelena Form and Cyndia Bent.   . Severe aortic stenosis    s/p tavr  . Skin cancer of face      Medications reviewed.   Current Outpatient Medications:  .  acetaminophen (TYLENOL) 500 MG tablet, Take 1,000 mg by mouth every 8 (eight) hours as needed for moderate pain., Disp: , Rfl:  .  amoxicillin (AMOXIL) 500 MG tablet, Take 4 capsules (2,000 mg) one hour prior to all dental visits., Disp: 8 tablet, Rfl: 11 .  aspirin EC 81 MG EC tablet, Take 1 tablet (81 mg total) by mouth daily. Swallow whole., Disp: 30 tablet, Rfl: 11 .  atorvastatin (LIPITOR) 20 MG tablet, TAKE 1 TABLET BY MOUTH DAILY AT 6 PM (Patient taking differently: Take 20 mg by mouth daily at 6 PM. ), Disp: 90 tablet, Rfl: 3 .  Cholecalciferol (VITAMIN D3) 50 MCG (2000 UT) TABS, Take 2,000 Units by mouth every evening., Disp: , Rfl:  .   desonide (DESOWEN) 0.05 % cream, Apply 1 application topically 2 (two) times daily as needed (rash/irritation.). , Disp: , Rfl:  .  finasteride (PROSCAR) 5 MG tablet, Take 5 mg by mouth daily., Disp: , Rfl:  .  fluocinonide (LIDEX) 0.05 % external solution, Apply 1 application topically 2 (two) times daily as needed (apply to ears).  (Patient not taking: Reported on 12/19/2019), Disp: , Rfl: 11 .  furosemide (LASIX) 40 MG tablet, Take 0.5 tablets (20 mg total) by mouth daily., Disp: , Rfl:  .  latanoprost (XALATAN) 0.005 % ophthalmic solution, Place 1 drop into both eyes at bedtime. , Disp: , Rfl:  .  metoprolol tartrate (LOPRESSOR) 25 MG tablet, Take 0.5 tablets (12.5 mg total) by mouth daily., Disp: 90 tablet, Rfl: 2 .  Multiple Vitamins-Minerals (PRESERVISION AREDS 2 PO), Take 1 capsule by mouth 2 (two) times daily. , Disp: , Rfl:  .  nitroGLYCERIN (NITROSTAT) 0.4 MG SL tablet, Place 1 tablet (0.4 mg total) under the tongue every 5 (five) minutes as needed for chest pain. Please make yearly appt with Dr. Tamala Julian for September. 1st attempt, Disp: 25 tablet, Rfl: 2 .  pantoprazole (PROTONIX) 40 MG tablet, TAKE 1 TABLET BY MOUTH DAILY GENERIC  EQUIVALENT FOR PROTONIX (Patient taking differently: Take 40 mg by mouth daily. ), Disp: 90 tablet, Rfl: 2 .  potassium chloride (KLOR-CON) 10 MEQ tablet, Take 1 tablet (10 mEq total) by mouth daily., Disp: 90 tablet, Rfl: 3 .  ranibizumab (LUCENTIS) 0.5 MG/0.05ML SOLN, 0.5 mg by Intravitreal route every 6 (six) weeks. Left Eye ONLY, Disp: , Rfl:  .  sertraline (ZOLOFT) 50 MG tablet, Take 50 mg by mouth every evening. , Disp: , Rfl:  .  tamsulosin (FLOMAX) 0.4 MG CAPS capsule, Take 0.8 mg by mouth daily after supper. , Disp: , Rfl:  .  timolol (BETIMOL) 0.5 % ophthalmic solution, Place 1 drop into the right eye daily. , Disp: , Rfl:    Ht Readings from Last 1 Encounters:  12/20/19 5\' 4"  (1.626 m)     Wt Readings from Last 3 Encounters:  12/20/19 165 lb 9.1 oz  (75.1 kg)  12/12/19 164 lb 8 oz (74.6 kg)  11/28/19 162 lb 9.6 oz (73.8 kg)     There is no height or weight on file to calculate BMI.   Social History   Tobacco Use  Smoking Status Former Smoker  . Packs/day: 0.25  . Years: 1.00  . Pack years: 0.25  . Types: Cigarettes, Cigars  Smokeless Tobacco Never Used  Tobacco Comment   occasional social smoker during college.     Lab Results  Component Value Date   CHOL 96 09/17/2019   Lab Results  Component Value Date   HDL 30 (L) 09/17/2019   Lab Results  Component Value Date   LDLCALC 43 09/17/2019   Lab Results  Component Value Date   TRIG 115 09/17/2019     Lab Results  Component Value Date   HGBA1C 6.6 (H) 09/17/2019     CBG (last 3)  No results for input(s): GLUCAP in the last 72 hours.   Nutrition Note  Spoke with pt. Nutrition Plan and Nutrition Survey goals reviewed with pt.  Pt tries to make healthy food choices. He wishes he ate healthier but he and wife live alone and eat out most meals.  He does enjoy fruit.  We reviewed ways to increase nutrient dense foods such as applesauce, canned fruit with NAS, whole grain cereals and breads, milk.  His goal is to continue drinking adequate water. He is drinking 64 oz water daily.  Pt expressed understanding of the information reviewed.    Nutrition Diagnosis ? Food-and nutrition-related knowledge deficit related to lack of exposure to information as related to diagnosis of: ? CVD ? Type 2 Diabetes   Nutrition Intervention ? Pt's individual nutrition plan reviewed with pt. ? Benefits of adopting Heart Healthy diet discussed when Medficts reviewed.   ? Continue client-centered nutrition education by RD, as part of interdisciplinary care.  Goal(s) ? Pt to build a healthy plate including vegetables, fruits, whole grains, and low-fat dairy products in a heart healthy meal plan. ? Pt to choose palatable, easy to prepare/prepackaged fruits,whole grains, and  veggies  Plan:   Will provide client-centered nutrition education as part of interdisciplinary care  Monitor and evaluate progress toward nutrition goal with team.   Michaele Offer, MS, RDN, LDN

## 2020-01-05 DIAGNOSIS — H353221 Exudative age-related macular degeneration, left eye, with active choroidal neovascularization: Secondary | ICD-10-CM | POA: Diagnosis not present

## 2020-01-06 ENCOUNTER — Other Ambulatory Visit: Payer: Self-pay

## 2020-01-06 ENCOUNTER — Encounter (HOSPITAL_COMMUNITY)
Admission: RE | Admit: 2020-01-06 | Discharge: 2020-01-06 | Disposition: A | Discharge status: Home / Self Care | Payer: Medicare Other | Source: Ambulatory Visit | Attending: Interventional Cardiology | Admitting: Interventional Cardiology

## 2020-01-06 DIAGNOSIS — Z952 Presence of prosthetic heart valve: Secondary | ICD-10-CM | POA: Diagnosis not present

## 2020-01-09 ENCOUNTER — Other Ambulatory Visit: Payer: Self-pay

## 2020-01-09 ENCOUNTER — Encounter (HOSPITAL_COMMUNITY)
Admission: RE | Admit: 2020-01-09 | Discharge: 2020-01-09 | Disposition: A | Discharge status: Home / Self Care | Payer: Medicare Other | Source: Ambulatory Visit | Attending: Interventional Cardiology | Admitting: Interventional Cardiology

## 2020-01-09 ENCOUNTER — Inpatient Hospital Stay: Payer: Medicare Other | Attending: Oncology | Admitting: Oncology

## 2020-01-09 ENCOUNTER — Inpatient Hospital Stay: Payer: Medicare Other

## 2020-01-09 VITALS — BP 141/74 | HR 58 | Temp 97.0°F | Resp 16 | Ht 64.0 in | Wt 163.3 lb

## 2020-01-09 DIAGNOSIS — D693 Immune thrombocytopenic purpura: Secondary | ICD-10-CM | POA: Diagnosis not present

## 2020-01-09 DIAGNOSIS — Z952 Presence of prosthetic heart valve: Secondary | ICD-10-CM | POA: Diagnosis not present

## 2020-01-09 DIAGNOSIS — I639 Cerebral infarction, unspecified: Secondary | ICD-10-CM | POA: Diagnosis not present

## 2020-01-09 DIAGNOSIS — D696 Thrombocytopenia, unspecified: Secondary | ICD-10-CM | POA: Diagnosis not present

## 2020-01-09 LAB — CBC WITH DIFFERENTIAL (CANCER CENTER ONLY)
Abs Immature Granulocytes: 0.09 10*3/uL — ABNORMAL HIGH (ref 0.00–0.07)
Basophils Absolute: 0.1 10*3/uL (ref 0.0–0.1)
Basophils Relative: 1 %
Eosinophils Absolute: 0.2 10*3/uL (ref 0.0–0.5)
Eosinophils Relative: 3 %
HCT: 34.5 % — ABNORMAL LOW (ref 39.0–52.0)
Hemoglobin: 11.1 g/dL — ABNORMAL LOW (ref 13.0–17.0)
Immature Granulocytes: 1 %
Lymphocytes Relative: 23 %
Lymphs Abs: 2.1 10*3/uL (ref 0.7–4.0)
MCH: 26.4 pg (ref 26.0–34.0)
MCHC: 32.2 g/dL (ref 30.0–36.0)
MCV: 82.1 fL (ref 80.0–100.0)
Monocytes Absolute: 1.7 10*3/uL — ABNORMAL HIGH (ref 0.1–1.0)
Monocytes Relative: 19 %
Neutro Abs: 4.9 10*3/uL (ref 1.7–7.7)
Neutrophils Relative %: 53 %
Platelet Count: 98 10*3/uL — ABNORMAL LOW (ref 150–400)
RBC: 4.2 MIL/uL — ABNORMAL LOW (ref 4.22–5.81)
RDW: 18.1 % — ABNORMAL HIGH (ref 11.5–15.5)
WBC Count: 9.2 10*3/uL (ref 4.0–10.5)
nRBC: 0 % (ref 0.0–0.2)

## 2020-01-09 NOTE — Progress Notes (Signed)
  [place] OFFICE PROGRESS NOTE   Diagnosis: ITP, history of hairy cell leukemia  INTERVAL HISTORY:   [name] returns as scheduled.  He remains off of prednisone.  He recently had a skin tear at the left forearm.  No other bleeding.  He is participating cardiac rehabilitation.  He reports persistent dyspnea, but his wife says he has been more active.  She appreciates he has improved significantly compared to pre-- TAVR.  Objective:  Vital signs in last 24 hours:  Blood pressure (!) 141/74, pulse (!) 58, temperature (!) 97 F (36.1 C), temperature source Tympanic, resp. rate 16, height $RemoveBe'5\' 4"'HHiVJbXWP$  (1.626 m), weight 163 lb 4.8 oz (74.1 kg), SpO2 98 %.   Resp: End inspiratory rales at the lower posterior chest bilaterally, no respiratory distress Cardio: Regular rate and rhythm GI: No hepatomegaly, nontender Vascular: No leg edema  Skin: Small skin tear at the left forearm without bleeding or evidence of infection    Lab Results:  Lab Results  Component Value Date   WBC 9.2 01/09/2020   HGB 11.1 (L) 01/09/2020   HCT 34.5 (L) 01/09/2020   MCV 82.1 01/09/2020   PLT 98 (L) 01/09/2020   NEUTROABS 4.9 01/09/2020    CMP  Lab Results  Component Value Date   NA 136 10/31/2019   K 4.3 10/31/2019   CL 100 10/31/2019   CO2 26 10/31/2019   GLUCOSE 126 (H) 10/31/2019   BUN 23 10/31/2019   CREATININE 1.21 10/31/2019   CALCIUM 9.4 10/31/2019   PROT 4.6 (L) 09/18/2019   ALBUMIN 2.2 (L) 09/18/2019   AST 19 09/18/2019   ALT 21 09/18/2019   ALKPHOS 59 09/18/2019   BILITOT 0.5 09/18/2019   GFRNONAA 51 (L) 10/31/2019   GFRAA 59 (L) 10/31/2019    Medications: I have reviewed the patient's current medications.   Assessment/Plan: 1. Thrombocytopenia ? Bone marrow biopsy 07/08/2016-no evidence of B-cell lymphoma, 40% cellular bone marrow with trilineage hematopoiesis, megakaryocytes present with normal morphology, normal cytogenetics, negative for BRAF mutation ? Flow  cytometry 01/05/2009-no monoclonal B-cell or phenotypically abnormal T-cell population ? Prednisone starting 06/29/2019 ? Nplate 07/04/2019, 2/92/4462,MMNOTR 07/18/2019 ? Prednisone 20 mg daily beginning 07/25/2019 ? Platelets 11,000 on 08/15/2019, prednisone increased to 40 mg daily, Nplate resume 09/10/6577 ? Prednisone decreased to 30 mg daily 08/25/2019, Nplate continued ? Prednisone continued at 30 mg daily 09/13/2019, weekly Nplate continued ? Prednisone taper to 20 mg daily 09/23/2019, weekly Nplate continued ? Prednisone taper to 10 mg daily 09/30/2019, weekly Nplate continued ? Prednisone taper to 5 mg daily 10/21/2019, weekly Nplate continued ? Prednisone taper to 5 mg every other day for 5 doses then stop 11/11/2019, weekly Nplate continued ? Nplate last given 0/38/3338 2. Hairy cell leukemia 1982, status post splenectomy 3. Coronary artery disease 4. Aortic stenosis 5. 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 Macular degeneration 7. Hearing loss 8. Basal cell carcinoma left cheek 05/24/2019 9. Left upper lobe nodule, faint FDG activity on PET at [facility] 05/31/2013 10. Status post TAVR procedure 09/06/2019 11. Admission with Pseudomonas urosepsis 09/16/2019 12. Acute left MCA distribution CVA 09/16/2019    Disposition: [name] appears well.  The platelet count remains adequate while off of prednisone and Nplate.  He will return for an office visit and CBC in 2 months.  He will contact us for spontaneous bleeding or bruising.  Betsy Coder, MD  01/09/2020  3:42 PM

## 2020-01-10 ENCOUNTER — Telehealth: Payer: Self-pay | Admitting: Oncology

## 2020-01-10 ENCOUNTER — Other Ambulatory Visit: Payer: Self-pay | Admitting: *Deleted

## 2020-01-10 NOTE — Telephone Encounter (Signed)
Scheduled appointments per 11/8 los. Called patient, no answer. Left message for patient with appointment date and time.

## 2020-01-11 ENCOUNTER — Encounter (HOSPITAL_COMMUNITY)
Admission: RE | Admit: 2020-01-11 | Discharge: 2020-01-11 | Disposition: A | Discharge status: Home / Self Care | Payer: Medicare Other | Source: Ambulatory Visit | Attending: Interventional Cardiology | Admitting: Interventional Cardiology

## 2020-01-11 ENCOUNTER — Other Ambulatory Visit: Payer: Self-pay

## 2020-01-11 DIAGNOSIS — Z952 Presence of prosthetic heart valve: Secondary | ICD-10-CM

## 2020-01-13 ENCOUNTER — Encounter (HOSPITAL_COMMUNITY)
Admission: RE | Admit: 2020-01-13 | Discharge: 2020-01-13 | Disposition: A | Discharge status: Home / Self Care | Payer: Medicare Other | Source: Ambulatory Visit | Attending: Interventional Cardiology | Admitting: Interventional Cardiology

## 2020-01-13 ENCOUNTER — Other Ambulatory Visit: Payer: Self-pay

## 2020-01-13 DIAGNOSIS — Z952 Presence of prosthetic heart valve: Secondary | ICD-10-CM | POA: Diagnosis not present

## 2020-01-13 NOTE — Progress Notes (Signed)
Reviewed home exercise Rx with patient today. Pt voices that he is walking at home on his off days form the CRP2 program; 3-4x/week for 30 minutes. Pt to continue his current walking program. Stressed the importance of warm-up, cool-down and stretching. Reviewed THRR of 51-102 and exercising to an RPE of 11-13 on the RPE scale. Weather parameters reviewed for temperature and humidity. We reviewed S/S to terminate the exercise and when to call MD vs 911. Also reviewed use of NTG use talk back teaching. Pt verbalized understanding of home exercise Rx and was provided a copy.  Lesly Rubenstein MS, ACSM-EP-C, CCRP

## 2020-01-14 ENCOUNTER — Other Ambulatory Visit: Payer: Self-pay

## 2020-01-14 ENCOUNTER — Ambulatory Visit (HOSPITAL_COMMUNITY)
Admission: EM | Admit: 2020-01-14 | Discharge: 2020-01-14 | Disposition: A | Discharge status: Home / Self Care | Payer: Medicare Other | Attending: Emergency Medicine | Admitting: Emergency Medicine

## 2020-01-14 ENCOUNTER — Encounter (HOSPITAL_COMMUNITY): Payer: Self-pay | Admitting: *Deleted

## 2020-01-14 DIAGNOSIS — S81811A Laceration without foreign body, right lower leg, initial encounter: Secondary | ICD-10-CM

## 2020-01-14 DIAGNOSIS — Z23 Encounter for immunization: Secondary | ICD-10-CM | POA: Diagnosis not present

## 2020-01-14 MED ORDER — TETANUS-DIPHTH-ACELL PERTUSSIS 5-2.5-18.5 LF-MCG/0.5 IM SUSY
PREFILLED_SYRINGE | INTRAMUSCULAR | Status: AC
  Filled 2020-01-14: qty 0.5

## 2020-01-14 MED ORDER — TETANUS-DIPHTH-ACELL PERTUSSIS 5-2.5-18.5 LF-MCG/0.5 IM SUSY
0.5000 mL | PREFILLED_SYRINGE | Freq: Once | INTRAMUSCULAR | Status: AC
  Administered 2020-01-14: 0.5 mL via INTRAMUSCULAR

## 2020-01-14 MED ORDER — CEPHALEXIN 500 MG PO CAPS: 500.0000 mg | ORAL_CAPSULE | Freq: Three times a day (TID) | ORAL | 0 refills | Status: DC

## 2020-01-14 NOTE — ED Provider Notes (Signed)
[facility]    CSN: [id number] Arrival date & time: 01/14/20  1540      History   Chief Complaint Chief Complaint  Patient presents with  . Laceration    HPI [name] is a 86 y.o. male.  Patient presents with a laceration on his right lower leg which occurred earlier today when he hit his leg on a bush in his yard. Bleeding controlled with direct pressure and bandage. No numbness or weakness in his foot or leg. Last tetanus unknown. His medical history is complicated and includes leukemia, hypertension, anemia, colon cancer, CAD, arthritis.  The history is provided by the patient, the spouse and medical records.    Past Medical History:  Diagnosis Date  . Acute appendicitis   . Anemia   . Anxiety   . Arthritis    "back" (03/14/2014)  . Blood dyscrasia    hairy cell leukemia  . CAD (coronary artery disease)    a. 03/14/14  s/p overlapping DES x2 to mid-distal RCA.  . 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 Colon cancer ([place]) 1984  . Compression fracture of lumbar spine, non-traumatic ([place])   . DJD (degenerative joint disease) of lumbar spine   . Dyslipidemia   . Dyspnea   . Elevated PSA   . GERD (gastroesophageal reflux disease)   . Glaucoma   . Hairy cell leukemia ([place]) dx'd 1980  . Heart murmur   . History of blood transfusion    "several; related to hairy cell leukemia & tx "  . History of stomach ulcers 1968  . Hypertension   . Leukemia, hairy cell ([place])   . Malnutrition ([place])   . Osteoarthritis   . Osteoporosis   . Pneumonia   . 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 Angelena Form and Cyndia Bent.   . Severe aortic stenosis    s/p tavr  . Skin cancer of face     Patient Active Problem List   Diagnosis Date Noted  . History of bacteremia   . Pseudomonas aeruginosa infection 09/17/2019  . Paroxysmal atrial fibrillation ([place]) 09/17/2019  .  Stroke (cerebrum) ([place]) 09/16/2019  . Urinary tract infection without hematuria   . Acute on chronic diastolic heart failure ([place]) 09/06/2019  . Bifascicular block 09/06/2019  . 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 Severe aortic stenosis   . Chronic ITP (idiopathic thrombocytopenia) ([facility]) 07/04/2019  . Chronic eczematous otitis externa of both ears 01/15/2016  . Sensorineural hearing loss (SNHL) of both ears 01/15/2016  . Prostate nodule 03/09/2015  . CAD (coronary artery disease)   . GERD (gastroesophageal reflux disease)   . Hypertension   . Dyslipidemia   . Colon cancer ([place])   . Solitary pulmonary nodule 02/11/2013  . 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 Shortness of breath 01/03/2013  . Glaucoma 11/27/2011  . Personal history of other malignant neoplasm of skin 10/14/2011  . Elevated PSA 09/12/2011  . Liposarcoma, well differentiated type ([place]) 09/12/2011  . Traction detachment of retina 11/18/2010  . Exudative age-related macular degeneration ([place]) 04/16/2010  . Epiretinal membrane 04/16/2010  . Vitreous degeneration 04/16/2010    Past Surgical History:  Procedure Laterality Date  . APPENDECTOMY  10/2007  . BUBBLE STUDY  11/02/2019   Procedure: BUBBLE STUDY;  Surgeon: Elouise Munroe, MD;  Location: [place];  Service: Cardiology;;  .  CATARACT EXTRACTION, BILATERAL Bilateral 02/2008  . COLON SURGERY  1984   "sigmoid; open"  . CORONARY ANGIOPLASTY WITH STENT PLACEMENT  03/14/2014   "2"  . EYE SURGERY Bilateral    cataract  . FRACTURE SURGERY    . LEFT HEART CATHETERIZATION WITH CORONARY ANGIOGRAM N/A 03/14/2014   Procedure: LEFT HEART CATHETERIZATION WITH CORONARY ANGIOGRAM;  Surgeon: Peter M Martinique, MD;  Location: [facility] CATH LAB;  Service: Cardiovascular;  Laterality: N/A;  . LIPOMA EXCISION Right 07/2011   liposarcoma resection; "back"  . MOHS SURGERY Left 02/2011  . MOHS SURGERY  X 3   "all  on my face"  . MOLE REMOVAL Left 1985   cheek  . ORIF ANKLE FRACTURE Left 2000  . RIGHT/LEFT HEART CATH AND CORONARY ANGIOGRAPHY N/A 07/26/2019   Procedure: RIGHT/LEFT HEART CATH AND CORONARY ANGIOGRAPHY;  Surgeon: Belva Crome, MD;  Location: [place] CV LAB;  Service: Cardiovascular;  Laterality: N/A;  . SHOULDER SURGERY  04/2004  . SPLENECTOMY  1992  . TEE WITHOUT CARDIOVERSION N/A 09/06/2019   Procedure: TRANSESOPHAGEAL ECHOCARDIOGRAM (TEE);  Surgeon: Burnell Blanks, MD;  Location: [place] CV LAB;  Service: Open Heart Surgery;  Laterality: N/A;  . TEE WITHOUT CARDIOVERSION N/A 11/02/2019   Procedure: TRANSESOPHAGEAL ECHOCARDIOGRAM (TEE);  Surgeon: Elouise Munroe, MD;  Location: [place];  Service: Cardiology;  Laterality: N/A;  . TONSILLECTOMY AND ADENOIDECTOMY  1939  . TRANSCATHETER AORTIC VALVE REPLACEMENT, TRANSFEMORAL N/A 09/06/2019   Procedure: TRANSCATHETER AORTIC VALVE REPLACEMENT, TRANSFEMORAL;  Surgeon: Burnell Blanks, MD;  Location: [place] CV LAB;  Service: Open Heart Surgery;  Laterality: N/A;       Home Medications    Prior to Admission medications   Medication Sig Start Date End Date Taking? Authorizing Provider  atorvastatin (LIPITOR) 20 MG tablet TAKE 1 TABLET BY MOUTH DAILY AT 6 PM Patient taking differently: Take 20 mg by mouth daily at 6 PM.  02/01/19  Yes Belva Crome, MD  Cholecalciferol (VITAMIN D3) 50 MCG (2000 UT) TABS Take 2,000 Units by mouth every evening.   Yes [provider]  finasteride (PROSCAR) 5 MG tablet Take 5 mg by mouth daily. 11/10/19  Yes [provider]  furosemide (LASIX) 40 MG tablet Take 0.5 tablets (20 mg total) by mouth daily. 09/21/19 09/15/20 Yes Mikhail, Velta Addison, DO  latanoprost (XALATAN) 0.005 % ophthalmic solution Place 1 drop into both eyes at bedtime.    Yes [provider]  metoprolol tartrate (LOPRESSOR) 25 MG tablet Take 0.5 tablets (12.5 mg total) by mouth daily. 09/08/19   Yes Eileen Stanford, PA-C  Multiple Vitamins-Minerals (PRESERVISION AREDS 2 PO) Take 1 capsule by mouth 2 (two) times daily.    Yes [provider]  potassium chloride (KLOR-CON) 10 MEQ tablet Take 1 tablet (10 mEq total) by mouth daily. 09/14/19 09/08/20 Yes Angelena Form R, PA-C  ranibizumab (LUCENTIS) 0.5 MG/0.05ML SOLN 0.5 mg by Intravitreal route every 6 (six) weeks. Left Eye ONLY 10/11/13  Yes [provider]  sertraline (ZOLOFT) 50 MG tablet Take 50 mg by mouth every evening.    Yes [provider]  tamsulosin (FLOMAX) 0.4 MG CAPS capsule Take 0.8 mg by mouth daily after supper.    Yes [provider]  timolol (BETIMOL) 0.5 % ophthalmic solution Place 1 drop into the right eye daily.    Yes [provider]  acetaminophen (TYLENOL) 500 MG tablet Take 1,000 mg by mouth every 8 (eight) hours as needed for moderate  pain.    [provider]  amoxicillin (AMOXIL) 500 MG tablet Take 4 capsules (2,000 mg) one hour prior to all dental visits. Patient not taking: Reported on 01/09/2020 09/14/19   Eileen Stanford, PA-C  aspirin EC 81 MG EC tablet Take 1 tablet (81 mg total) by mouth daily. Swallow whole. 09/22/19   Mikhail, Velta Addison, DO  cephALEXin (KEFLEX) 500 MG capsule Take 1 capsule (500 mg total) by mouth 3 (three) times daily. 01/14/20   Sharion Balloon, NP  desonide (DESOWEN) 0.05 % cream Apply 1 application topically 2 (two) times daily as needed (rash/irritation.).  Patient not taking: Reported on 01/09/2020 01/28/16   [provider]  fluocinonide (LIDEX) 0.05 % external solution Apply 1 application topically 2 (two) times daily as needed (apply to ears).  Patient not taking: Reported on 12/19/2019 05/31/15   [provider]  nitroGLYCERIN (NITROSTAT) 0.4 MG SL tablet Place 1 tablet (0.4 mg total) under the tongue every 5 (five) minutes as needed for chest pain. Please make yearly appt with Dr. Tamala Julian for September. 1st  attempt Patient not taking: Reported on 01/09/2020 08/12/18   Belva Crome, MD  pantoprazole ([place]) 40 MG tablet TAKE 1 TABLET BY MOUTH DAILY GENERIC EQUIVALENT FOR PROTONIX Patient taking differently: Take 40 mg by mouth daily.  08/11/19   Belva Crome, MD    Family History Family History  Problem Relation Age of Onset  . Congestive Heart Failure Father 75  . Stroke Mother   . Diabetes Mellitus II Brother   . Prostate cancer Brother   . Heart Problems Brother        CABG    Social History Social History   Tobacco Use  . Smoking status: Former Smoker    Packs/day: 0.25    Years: 1.00    Pack years: 0.25    Types: Cigarettes, Cigars  . Smokeless tobacco: Never Used  . Tobacco comment: occasional social smoker during college.  Vaping Use  . Vaping Use: Never used  Substance Use Topics  . Alcohol use: Yes    Alcohol/week: 9.0 standard drinks    Types: 2 Glasses of wine, 2 Shots of liquor, 5 Standard drinks or equivalent per week    Comment: socially  . Drug use: No     Allergies   Antazoline; Antihistamines, chlorpheniramine-type; and Sulfa antibiotics   Review of Systems Review of Systems  Constitutional: Negative for chills and fever.  HENT: Negative for ear pain and sore throat.   Eyes: Negative for pain and visual disturbance.  Respiratory: Negative for cough and shortness of breath.   Cardiovascular: Negative for chest pain and palpitations.  Gastrointestinal: Negative for abdominal pain and vomiting.  Genitourinary: Negative for dysuria and hematuria.  Musculoskeletal: Negative for arthralgias and back pain.  Skin: Positive for wound. Negative for color change and rash.  Neurological: Negative for seizures and syncope.  All other systems reviewed and are negative.    Physical Exam Triage Vital Signs ED Triage Vitals  Enc Vitals Group     BP      Pulse      Resp      Temp      Temp src      SpO2      Weight      Height      Head  Circumference      Peak Flow      Pain Score      Pain Loc  Pain Edu?      Excl. in [place]?    No data found.  Updated Vital Signs BP 138/73 (BP Location: Left Arm)   Pulse (!) 58   Temp 97.9 F (36.6 C) (Oral)   Resp 16   Ht 5\' 4"  (1.626 m)   Wt 163 lb (73.9 kg)   SpO2 100%   BMI 27.98 kg/m   Visual Acuity Right Eye Distance:   Left Eye Distance:   Bilateral Distance:    Right Eye Near:   Left Eye Near:    Bilateral Near:     Physical Exam Vitals and nursing note reviewed.  Constitutional:      General: He is not in acute distress.    Appearance: He is well-developed.  HENT:     Head: Normocephalic and atraumatic.     Mouth/Throat:     Mouth: Mucous membranes are moist.  Eyes:     Conjunctiva/sclera: Conjunctivae normal.  Cardiovascular:     Rate and Rhythm: Normal rate and regular rhythm.     Heart sounds: Normal heart sounds.  Pulmonary:     Effort: Pulmonary effort is normal. No respiratory distress.     Breath sounds: Normal breath sounds.  Abdominal:     Palpations: Abdomen is soft.     Tenderness: There is no abdominal tenderness.  Musculoskeletal:        General: No deformity.     Cervical back: Neck supple.  Skin:    General: Skin is warm and dry.     Capillary Refill: Capillary refill takes less than 2 seconds.     Findings: Lesion present.          Comments: 8 cm skin tear on right lower leg. Bleeding controlled.  Neurological:     Mental Status: He is alert. Mental status is at baseline.     Sensory: No sensory deficit.     Motor: No weakness.     Gait: Gait normal.  Psychiatric:        Mood and Affect: Mood normal.        Behavior: Behavior normal.      UC Treatments / Results  Labs (all labs ordered are listed, but only abnormal results are displayed) Labs Reviewed - No data to display  EKG   Radiology No results found.  Procedures Laceration Repair  Date/Time: 01/14/2020 5:12 PM Performed by: Sharion Balloon,  NP Authorized by: Sharion Balloon, NP   Consent:    Consent obtained:  Verbal   Consent given by:  Patient and spouse   Risks discussed:  Infection, pain and poor wound healing Anesthesia (see MAR for exact dosages):    Anesthesia method:  None Laceration details:    Location:  Leg   Leg location:  R lower leg   Length (cm):  8 Repair type:    Repair type:  Simple Exploration:    Hemostasis achieved with:  Direct pressure Treatment:    Amount of cleaning:  Standard   Irrigation solution:  Sterile saline   Irrigation method:  Syringe   Visualized foreign bodies/material removed: no   Skin repair:    Repair method:  Steri-Strips   Number of Steri-Strips:  8 Post-procedure details:    Dressing:  Non-adherent dressing and sterile dressing   Patient tolerance of procedure:  Tolerated well, no immediate complications   (including critical care time)  Medications Ordered in UC Medications  Tdap (BOOSTRIX) injection 0.5 mL (0.5 mLs Intramuscular Given 01/14/20 1704)  Initial Impression / Assessment and Plan / UC Course  I have reviewed the triage vital signs and the nursing notes.  Pertinent labs & imaging results that were available during my care of the patient were reviewed by me and considered in my medical decision making (see chart for details).   Laceration of right lower extremity. Steri-Strips applied. Tetanus updated. Treating with Keflex x7 days. Wound care instructions and signs of infection discussed with patient and his wife. Instructed patient to follow-up with his PCP in 3 days for a recheck of his wound. Instructed him to follow-up sooner or return here if he notes signs of infection. Patient and his wife agree to plan of care.   Final Clinical Impressions(s) / UC Diagnoses   Final diagnoses:  Laceration of right lower extremity, initial encounter     Discharge Instructions     Keep your wound clean and dry.  See the attached instructions for taking care  of your Steri-Strips.    Your tetanus was updated today.  Follow-up with your primary care provider in 3 days for recheck of the wound.  Be seen sooner if you see signs of infection, such as increased pain, redness, pus-like drainage, warmth, fever, chills, or other concerning symptoms.           ED Prescriptions    Medication Sig Dispense Auth. Provider   cephALEXin (KEFLEX) 500 MG capsule Take 1 capsule (500 mg total) by mouth 3 (three) times daily. 21 capsule Sharion Balloon, NP     PDMP not reviewed this encounter.   Sharion Balloon, NP 01/14/20 1714

## 2020-01-14 NOTE — ED Triage Notes (Signed)
Pt presents today with Rt lower leg lac that occurred today when he hit his leg on a stiff bush in his yard. DSy is dry and intact on arrival to room. Wife placed pressure dsy to leg at home.

## 2020-01-14 NOTE — Discharge Instructions (Signed)
Keep your wound clean and dry.  See the attached instructions for taking care of your Steri-Strips.    Your tetanus was updated today.  Follow-up with your primary care provider in 3 days for recheck of the wound.  Be seen sooner if you see signs of infection, such as increased pain, redness, pus-like drainage, warmth, fever, chills, or other concerning symptoms.

## 2020-01-16 ENCOUNTER — Other Ambulatory Visit: Payer: Self-pay

## 2020-01-16 ENCOUNTER — Encounter (HOSPITAL_COMMUNITY)
Admission: RE | Admit: 2020-01-16 | Discharge: 2020-01-16 | Disposition: A | Discharge status: Home / Self Care | Payer: Medicare Other | Source: Ambulatory Visit | Attending: Interventional Cardiology | Admitting: Interventional Cardiology

## 2020-01-16 DIAGNOSIS — Z952 Presence of prosthetic heart valve: Secondary | ICD-10-CM

## 2020-01-17 DIAGNOSIS — D696 Thrombocytopenia, unspecified: Secondary | ICD-10-CM | POA: Diagnosis not present

## 2020-01-17 DIAGNOSIS — S81811A Laceration without foreign body, right lower leg, initial encounter: Secondary | ICD-10-CM | POA: Diagnosis not present

## 2020-01-18 ENCOUNTER — Other Ambulatory Visit: Payer: Self-pay

## 2020-01-18 ENCOUNTER — Encounter (HOSPITAL_COMMUNITY): Payer: Medicare Other

## 2020-01-18 ENCOUNTER — Encounter (HOSPITAL_COMMUNITY)
Admission: RE | Admit: 2020-01-18 | Discharge: 2020-01-18 | Disposition: A | Discharge status: Home / Self Care | Payer: Medicare Other | Source: Ambulatory Visit | Attending: Interventional Cardiology | Admitting: Interventional Cardiology

## 2020-01-18 ENCOUNTER — Ambulatory Visit (HOSPITAL_COMMUNITY): Payer: Medicare Other | Attending: Internal Medicine

## 2020-01-18 DIAGNOSIS — Z952 Presence of prosthetic heart valve: Secondary | ICD-10-CM | POA: Diagnosis not present

## 2020-01-18 LAB — ECHOCARDIOGRAM COMPLETE
AV Mean grad: 14 mmHg
AV Peak grad: 19 mmHg
Ao pk vel: 2.18 m/s
Area-P 1/2: 2.01 cm2
S' Lateral: 2.5 cm

## 2020-01-19 ENCOUNTER — Other Ambulatory Visit: Payer: Self-pay | Admitting: Interventional Cardiology

## 2020-01-20 ENCOUNTER — Other Ambulatory Visit: Payer: Self-pay

## 2020-01-20 ENCOUNTER — Encounter (HOSPITAL_COMMUNITY)
Admission: RE | Admit: 2020-01-20 | Discharge: 2020-01-20 | Disposition: A | Discharge status: Home / Self Care | Payer: Medicare Other | Source: Ambulatory Visit | Attending: Interventional Cardiology | Admitting: Interventional Cardiology

## 2020-01-20 DIAGNOSIS — Z952 Presence of prosthetic heart valve: Secondary | ICD-10-CM

## 2020-01-23 ENCOUNTER — Ambulatory Visit (INDEPENDENT_AMBULATORY_CARE_PROVIDER_SITE_OTHER): Payer: Medicare Other | Admitting: Interventional Cardiology

## 2020-01-23 ENCOUNTER — Encounter: Payer: Self-pay | Admitting: Interventional Cardiology

## 2020-01-23 ENCOUNTER — Encounter (HOSPITAL_COMMUNITY)
Admission: RE | Admit: 2020-01-23 | Discharge: 2020-01-23 | Disposition: A | Discharge status: Home / Self Care | Payer: Medicare Other | Source: Ambulatory Visit | Attending: Interventional Cardiology | Admitting: Interventional Cardiology

## 2020-01-23 ENCOUNTER — Other Ambulatory Visit: Payer: Self-pay

## 2020-01-23 VITALS — BP 120/72 | HR 62 | Ht 64.0 in | Wt 164.6 lb

## 2020-01-23 DIAGNOSIS — I10 Essential (primary) hypertension: Secondary | ICD-10-CM

## 2020-01-23 DIAGNOSIS — I480 Paroxysmal atrial fibrillation: Secondary | ICD-10-CM

## 2020-01-23 DIAGNOSIS — I5032 Chronic diastolic (congestive) heart failure: Secondary | ICD-10-CM | POA: Diagnosis not present

## 2020-01-23 DIAGNOSIS — E785 Hyperlipidemia, unspecified: Secondary | ICD-10-CM

## 2020-01-23 DIAGNOSIS — I639 Cerebral infarction, unspecified: Secondary | ICD-10-CM | POA: Diagnosis not present

## 2020-01-23 DIAGNOSIS — D696 Thrombocytopenia, unspecified: Secondary | ICD-10-CM | POA: Diagnosis not present

## 2020-01-23 DIAGNOSIS — Z952 Presence of prosthetic heart valve: Secondary | ICD-10-CM

## 2020-01-23 DIAGNOSIS — Z7189 Other specified counseling: Secondary | ICD-10-CM

## 2020-01-23 NOTE — Progress Notes (Signed)
Cardiology Office Note:    Date:  01/23/2020   ID:  [name], DOB [birth year], MRN [id number]  PCP:  Douglas Battles, MD  Cardiologist:  Douglas Grooms, MD   Referring MD: Douglas Battles, MD   Chief Complaint  Patient presents with  . Cardiac Valve Problem  . Chest Pain    History of Present Illness:    [name] is a 86 y.o. male with a hx of a history of idiopathic thrombocytopenia (Rx'd with Nplate and steroids),bifasicular block (RBBB, LAFB), anemia, anxiety, CAD, colon cancer,HLD, GERD, hairy cell leukemia, PAF found on monitor, HTN and severe aortic stenosiss/p TAVR (09/06/19)followed by a readmission for urosepsis/bacteremia who presents to clinic for evaluation of worsening shortness of breath.   Has dyspnea on exertion.  He has come a long way.  He had a complicated course after TAVR.  He had a bladder infection, urinary retention, encephalopathy, and infection.  He has now gotten over all of that.  Says his strength is coming back.  He has some dyspnea on exertion.  Otherwise doing okay.  His wife feels his breathing is much improved over the last month or so.  He denies orthopnea, lower extremity swelling, palpitations, and syncope.    Past Medical History:  Diagnosis Date  . Acute appendicitis   . Anemia   . Anxiety   . Arthritis    "back" (03/14/2014)  . Blood dyscrasia    hairy cell leukemia  . CAD (coronary artery disease)    a. 03/14/14  s/p overlapping DES x2 to mid-distal RCA.  . 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 Colon cancer ([place]) 1984  . Compression fracture of lumbar spine, non-traumatic ([place])   . DJD (degenerative joint disease) of lumbar spine   . Dyslipidemia   . Dyspnea   . Elevated PSA   . GERD (gastroesophageal reflux disease)   . Glaucoma   . Hairy cell leukemia ([place]) dx'd 1980  . Heart murmur   . History of blood transfusion    "several; related to hairy cell leukemia & tx "  . History of stomach  ulcers 1968  . Hypertension   . Leukemia, hairy cell ([place])   . Malnutrition ([place])   . Osteoarthritis   . Osteoporosis   . Pneumonia   . 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 Angelena Form and Cyndia Bent.   . Severe aortic stenosis    s/p tavr  . Skin cancer of face     Past Surgical History:  Procedure Laterality Date  . APPENDECTOMY  10/2007  . BUBBLE STUDY  11/02/2019   Procedure: BUBBLE STUDY;  Surgeon: Douglas Munroe, MD;  Location: [facility] ENDOSCOPY;  Service: Cardiology;;  . CATARACT EXTRACTION, BILATERAL Bilateral 02/2008  . COLON SURGERY  1984   "sigmoid; open"  . CORONARY ANGIOPLASTY WITH STENT PLACEMENT  03/14/2014   "2"  . EYE SURGERY Bilateral    cataract  . FRACTURE SURGERY    . LEFT HEART CATHETERIZATION WITH CORONARY ANGIOGRAM N/A 03/14/2014   Procedure: LEFT HEART CATHETERIZATION WITH CORONARY ANGIOGRAM;  Surgeon: Douglas M Martinique, MD;  Location: [facility] CATH LAB;  Service: Cardiovascular;  Laterality: N/A;  . LIPOMA EXCISION Right 07/2011   liposarcoma resection; "back"  . MOHS SURGERY Left 02/2011  . MOHS SURGERY  X 3   "all on my face"  . [place]  cheek  . ORIF ANKLE FRACTURE Left 2000  . RIGHT/LEFT HEART CATH AND CORONARY ANGIOGRAPHY N/A 07/26/2019   Procedure: RIGHT/LEFT HEART CATH AND CORONARY ANGIOGRAPHY;  Surgeon: Douglas Crome, MD;  Location: [place] CV LAB;  Service: Cardiovascular;  Laterality: N/A;  . SHOULDER SURGERY  04/2004  . SPLENECTOMY  1992  . TEE WITHOUT CARDIOVERSION N/A 09/06/2019   Procedure: TRANSESOPHAGEAL ECHOCARDIOGRAM (TEE);  Surgeon: Douglas Blanks, MD;  Location: [place] CV LAB;  Service: Open Heart Surgery;  Laterality: N/A;  . TEE WITHOUT CARDIOVERSION N/A 11/02/2019   Procedure: TRANSESOPHAGEAL ECHOCARDIOGRAM (TEE);  Surgeon: Douglas Munroe, MD;  Location: [place];  Service: Cardiology;  Laterality: N/A;  . TONSILLECTOMY AND  ADENOIDECTOMY  1939  . TRANSCATHETER AORTIC VALVE REPLACEMENT, TRANSFEMORAL N/A 09/06/2019   Procedure: TRANSCATHETER AORTIC VALVE REPLACEMENT, TRANSFEMORAL;  Surgeon: Douglas Blanks, MD;  Location: [place] CV LAB;  Service: Open Heart Surgery;  Laterality: N/A;    Current Medications: Current Meds  Medication Sig  . acetaminophen (TYLENOL) 500 MG tablet Take 1,000 mg by mouth every 8 (eight) hours as needed for moderate pain.  Marland Kitchen amoxicillin (AMOXIL) 500 MG tablet Take 4 capsules (2,000 mg) one hour prior to all dental visits.  Marland Kitchen aspirin EC 81 MG EC tablet Take 1 tablet (81 mg total) by mouth daily. Swallow whole.  Marland Kitchen atorvastatin (LIPITOR) 20 MG tablet TAKE 1 TABLET BY MOUTH DAILY AT 6 PM  . Cholecalciferol (VITAMIN D3) 50 MCG (2000 UT) TABS Take 2,000 Units by mouth every evening.  Marland Kitchen desonide (DESOWEN) 0.05 % cream Apply 1 application topically 2 (two) times daily as needed (rash/irritation.).   Marland Kitchen doxycycline (VIBRAMYCIN) 100 MG capsule Take 100 mg by mouth every 12 (twelve) hours.  . finasteride (PROSCAR) 5 MG tablet Take 5 mg by mouth daily.  . fluocinonide (LIDEX) 0.05 % external solution Apply 1 application topically 2 (two) times daily as needed (apply to ears).   . furosemide (LASIX) 40 MG tablet Take 0.5 tablets (20 mg total) by mouth daily.  Marland Kitchen latanoprost (XALATAN) 0.005 % ophthalmic solution Place 1 drop into both eyes at bedtime.   . metoprolol tartrate (LOPRESSOR) 25 MG tablet Take 0.5 tablets (12.5 mg total) by mouth daily.  . Multiple Vitamins-Minerals (PRESERVISION AREDS 2 PO) Take 1 capsule by mouth 2 (two) times daily.   . nitroGLYCERIN (NITROSTAT) 0.4 MG SL tablet Place 1 tablet (0.4 mg total) under the tongue every 5 (five) minutes as needed for chest pain. Please make yearly appt with Dr. Tamala [name] for September. 1st attempt  . pantoprazole (PROTONIX) 40 MG tablet TAKE 1 TABLET BY MOUTH DAILY GENERIC EQUIVALENT FOR PROTONIX  . potassium chloride (KLOR-CON) 10 MEQ  tablet Take 1 tablet (10 mEq total) by mouth daily.  . ranibizumab (LUCENTIS) 0.5 MG/0.05ML SOLN 0.5 mg by Intravitreal route every 6 (six) weeks. Left Eye ONLY  . sertraline (ZOLOFT) 50 MG tablet Take 50 mg by mouth every evening.   . tamsulosin (FLOMAX) 0.4 MG CAPS capsule Take 0.8 mg by mouth daily after supper.   . timolol (BETIMOL) 0.5 % ophthalmic solution Place 1 drop into the right eye daily.      Allergies:   Antazoline; Antihistamines, chlorpheniramine-type; and Sulfa antibiotics   Social History   Socioeconomic History  . Marital status: Married    Spouse name: Not on file  . Number of children: Not on file  . Years of education: 55  . Highest education level: Bachelor's degree (e.g., BA,  AB, BS)  Occupational History  . Occupation: Retired  Tobacco Use  . Smoking status: Former Smoker    Packs/day: 0.25    Years: 1.00    Pack years: 0.25    Types: Cigarettes, Cigars  . Smokeless tobacco: Never Used  . Tobacco comment: occasional social smoker during college.  Vaping Use  . Vaping Use: Never used  Substance and Sexual Activity  . Alcohol use: Yes    Alcohol/week: 9.0 standard drinks    Types: 2 Glasses of wine, 2 Shots of liquor, 5 Standard drinks or equivalent per week    Comment: socially  . Drug use: No  . Sexual activity: Not Currently  Other Topics Concern  . Not on file  Social History Narrative  . Not on file   Social Determinants of Health   Financial Resource Strain:   . Difficulty of Paying Living Expenses: Not on file  Food Insecurity: No Food Insecurity  . Worried About Charity fundraiser in the Last Year: Never true  . Ran Out of Food in the Last Year: Never true  Transportation Needs: No Transportation Needs  . Lack of Transportation (Medical): No  . Lack of Transportation (Non-Medical): No  Physical Activity:   . Days of Exercise per Week: Not on file  . Minutes of Exercise per Session: Not on file  Stress:   . Feeling of Stress : Not  on file  Social Connections:   . Frequency of Communication with Friends and Family: Not on file  . Frequency of Social Gatherings with Friends and Family: Not on file  . Attends Religious Services: Not on file  . Active Member of Clubs or Organizations: Not on file  . Attends Archivist Meetings: Not on file  . Marital Status: Not on file     Family History: The patient's family history includes Congestive Heart Failure (age of onset: 69) in his father; Diabetes Mellitus II in his brother; Heart Problems in his brother; Prostate cancer in his brother; Stroke in his mother.  ROS:   Please see the history of present illness.    Memory is improved although his wife states that he still gets somewhat confused.  Appetite is improving.  All other systems reviewed and are negative.  EKGs/Labs/Other Studies Reviewed:    The following studies were reviewed today: 2D Doppler echocardiogram 01/18/2020: IMPRESSIONS    1. Left ventricular ejection fraction, by estimation, is 60 to 65%. The  left ventricle has normal function. The left ventricle has no regional  wall motion abnormalities. There is mild left ventricular hypertrophy.  Left ventricular diastolic parameters  are consistent with Grade I diastolic dysfunction (impaired relaxation).  Elevated left ventricular end-diastolic pressure. The E/e' is 66.  2. Right ventricular systolic function is normal. The right ventricular  size is normal. There is normal pulmonary artery systolic pressure. The  estimated right ventricular systolic pressure is 58.5 mmHg.  3. The mitral valve is abnormal. Trivial mitral valve regurgitation.  4. The aortic valve has been repaired/replaced. Aortic valve  regurgitation is trivial - no paravalvular leak. There is a 26 mm Edwards  Sapien prosthetic (TAVR) valve present in the aortic position. Procedure  Date: 09/06/2019. Aortic valve mean gradient  measures 14.0 mmHg. Aortic valve Vmax measures  2.18 m/s. DI is 0.51.  5. The inferior vena cava is dilated in size with >50% respiratory  variability, suggesting right atrial pressure of 8 mmHg.  6. Aortic dilatation noted. There is borderline dilatation  of the aortic  root, measuring 38 mm.   Comparison(s): Changes from prior study are noted. 10/19/2019: LVEF 60-65%,  mean TAVR gradient 23 mmHg.  EKG:  EKG not repeated  Recent Labs: 09/02/2019: B Natriuretic Peptide 296.9 09/18/2019: ALT 21 09/21/2019: Magnesium 1.9 10/31/2019: BUN 23; Creatinine, Ser 1.21; Potassium 4.3; Sodium 136 01/09/2020: Hemoglobin 11.1; Platelet Count 98  Recent Lipid Panel    Component Value Date/Time   CHOL 96 09/17/2019 0752   TRIG 115 09/17/2019 0752   HDL 30 (L) 09/17/2019 0752   CHOLHDL 3.2 09/17/2019 0752   VLDL 23 09/17/2019 0752   LDLCALC 43 09/17/2019 0752    Physical Exam:    VS:  BP 120/72   Pulse 62   Ht 5\' 4"  (1.626 m)   Wt 164 lb 9.6 oz (74.7 kg)   SpO2 97%   BMI 28.25 kg/m     Wt Readings from Last 3 Encounters:  01/23/20 164 lb 9.6 oz (74.7 kg)  01/14/20 163 lb (73.9 kg)  01/09/20 163 lb 4.8 oz (74.1 kg)     GEN: Compatible with age good skin:. No acute distress HEENT: Normal NECK: No JVD. LYMPHATICS: No lymphadenopathy CARDIAC:  RRR without murmur, gallop, or edema. VASCULAR:  Normal Pulses. No bruits. RESPIRATORY:  Clear to auscultation without rales, wheezing or rhonchi  ABDOMEN: Soft, non-tender, non-distended, No pulsatile mass, MUSCULOSKELETAL: No deformity  SKIN: Warm and dry NEUROLOGIC:  Alert and oriented x 3 PSYCHIATRIC:  Normal affect   ASSESSMENT:    1. S/P TAVR (transcatheter aortic valve replacement)   2. Essential hypertension   3. Chronic diastolic CHF (congestive heart failure) ([place])   4. PAF (paroxysmal atrial fibrillation) ([place])   5. Dyslipidemia   6. Anemia with low platelet count ([facility])   7. Educated about COVID-19 virus infection    PLAN:    In order of problems listed above:  1. TAVR  valve gradient is stable.  It is mildly elevated.  Breathing is improving. 2. Blood pressure control is adequate 3. No evidence of volume overload on exam.  Lungs are clear. 4. No episodes of atrial fibrillation currently.  On Eliquis without bleeding complications. 5. Continue management of lipids, atorvastatin. 6. Not recently evaluated.  Hemoglobin has been above 11. 7. He is vaccinated and boosted.  Plan clinical follow-up in 3 to 4 months.  Encouraged him to continue ambulating and trying to build exercise tolerance.   Medication Adjustments/Labs and Tests Ordered: Current medicines are reviewed at length with the patient today.  Concerns regarding medicines are outlined above.  No orders of the defined types were placed in this encounter.  No orders of the defined types were placed in this encounter.   Patient Instructions  Medication Instructions:  Your physician recommends that you continue on your current medications as directed. Please refer to the Current Medication list given to you today.  *If you need a refill on your cardiac medications before your next appointment, please call your pharmacy*   Lab Work: None If you have labs (blood work) drawn today and your tests are completely normal, you will receive your results only by: Marland Kitchen MyChart Message (if you have MyChart) OR . A paper copy in the mail If you have any lab test that is abnormal or we need to change your treatment, we will call you to review the results.   Testing/Procedures: None   Follow-Up: At [facility], you and your health needs are our priority.  As part of our  continuing mission to provide you with exceptional heart care, we have created designated Provider Care Teams.  These Care Teams include your primary Cardiologist (physician) and Advanced Practice Providers (APPs -  Physician Assistants and Nurse Practitioners) who all work together to provide you with the care you need, when you need  it.  We recommend signing up for the patient portal called "MyChart".  Sign up information is provided on this After Visit Summary.  MyChart is used to connect with patients for Virtual Visits (Telemedicine).  Patients are able to view lab/test results, encounter notes, upcoming appointments, etc.  Non-urgent messages can be sent to your provider as well.   To learn more about what you can do with MyChart, go to [email or web address].    Your next appointment:   4 month(s)  The format for your next appointment:   In Person  Provider:   You may see Douglas Grooms, MD or one of the following Advanced Practice Providers on your designated Care Team:    Truitt Merle, NP  Cecilie Kicks, NP  Kathyrn Drown, NP    Other Instructions      Signed, Douglas Grooms, MD  01/23/2020 4:27 PM    [place]

## 2020-01-23 NOTE — Patient Instructions (Signed)
Medication Instructions:  Your physician recommends that you continue on your current medications as directed. Please refer to the Current Medication list given to you today.  *If you need a refill on your cardiac medications before your next appointment, please call your pharmacy*   Lab Work: None If you have labs (blood work) drawn today and your tests are completely normal, you will receive your results only by: Marland Kitchen MyChart Message (if you have MyChart) OR . A paper copy in the mail If you have any lab test that is abnormal or we need to change your treatment, we will call you to review the results.   Testing/Procedures: None   Follow-Up: At [facility], you and your health needs are our priority.  As part of our continuing mission to provide you with exceptional heart care, we have created designated Provider Care Teams.  These Care Teams include your primary Cardiologist (physician) and Advanced Practice Providers (APPs -  Physician Assistants and Nurse Practitioners) who all work together to provide you with the care you need, when you need it.  We recommend signing up for the patient portal called "MyChart".  Sign up information is provided on this After Visit Summary.  MyChart is used to connect with patients for Virtual Visits (Telemedicine).  Patients are able to view lab/test results, encounter notes, upcoming appointments, etc.  Non-urgent messages can be sent to your provider as well.   To learn more about what you can do with MyChart, go to [email or web address].    Your next appointment:   4 month(s)  The format for your next appointment:   In Person  Provider:   You may see Sinclair Grooms, MD or one of the following Advanced Practice Providers on your designated Care Team:    Truitt Merle, NP  Cecilie Kicks, NP  Kathyrn Drown, NP    Other Instructions

## 2020-01-23 NOTE — Progress Notes (Signed)
Cardiac Individual Treatment Plan  Patient Details  Name: [name] MRN: [id number] Date of Birth: [birth year] Referring Provider:     Fel[facility] from 12/20/2019 in [place]  Referring Provider Lauree Chandler, MD      Initial Encounter Date:    CARDIAC REHAB PHASE II ORIENTATION from 12/20/2019 in [place]  Date 12/20/19      Visit Diagnosis: S/P TAVR (transcatheter aortic valve replacement) 09/06/19  Patient's Home Medications on Admission:  Current Outpatient Medications:  .  acetaminophen (TYLENOL) 500 MG tablet, Take 1,000 mg by mouth every 8 (eight) hours as needed for moderate pain., Disp: , Rfl:  .  amoxicillin (AMOXIL) 500 MG tablet, Take 4 capsules (2,000 mg) one hour prior to all dental visits., Disp: 8 tablet, Rfl: 11 .  aspirin EC 81 MG EC tablet, Take 1 tablet (81 mg total) by mouth daily. Swallow whole., Disp: 30 tablet, Rfl: 11 .  atorvastatin (LIPITOR) 20 MG tablet, TAKE 1 TABLET BY MOUTH DAILY AT 6 PM, Disp: 90 tablet, Rfl: 2 .  Cholecalciferol (VITAMIN D3) 50 MCG (2000 UT) TABS, Take 2,000 Units by mouth every evening., Disp: , Rfl:  .  desonide (DESOWEN) 0.05 % cream, Apply 1 application topically 2 (two) times daily as needed (rash/irritation.). , Disp: , Rfl:  .  doxycycline (VIBRAMYCIN) 100 MG capsule, Take 100 mg by mouth every 12 (twelve) hours., Disp: , Rfl:  .  finasteride (PROSCAR) 5 MG tablet, Take 5 mg by mouth daily., Disp: , Rfl:  .  fluocinonide (LIDEX) 0.05 % external solution, Apply 1 application topically 2 (two) times daily as needed (apply to ears). , Disp: , Rfl: 11 .  furosemide (LASIX) 40 MG tablet, Take 0.5 tablets (20 mg total) by mouth daily., Disp: , Rfl:  .  latanoprost (XALATAN) 0.005 % ophthalmic solution, Place 1 drop into both eyes at bedtime. , Disp: , Rfl:  .  metoprolol tartrate (LOPRESSOR) 25 MG tablet, Take 0.5 tablets (12.5 mg  total) by mouth daily., Disp: 90 tablet, Rfl: 2 .  Multiple Vitamins-Minerals (PRESERVISION AREDS 2 PO), Take 1 capsule by mouth 2 (two) times daily. , Disp: , Rfl:  .  nitroGLYCERIN (NITROSTAT) 0.4 MG SL tablet, Place 1 tablet (0.4 mg total) under the tongue every 5 (five) minutes as needed for chest pain. Please make yearly appt with Dr. Tamala Julian for September. 1st attempt, Disp: 25 tablet, Rfl: 2 .  pantoprazole (PROTONIX) 40 MG tablet, TAKE 1 TABLET BY MOUTH DAILY GENERIC EQUIVALENT FOR PROTONIX, Disp: 90 tablet, Rfl: 2 .  potassium chloride (KLOR-CON) 10 MEQ tablet, Take 1 tablet (10 mEq total) by mouth daily., Disp: 90 tablet, Rfl: 3 .  ranibizumab (LUCENTIS) 0.5 MG/0.05ML SOLN, 0.5 mg by Intravitreal route every 6 (six) weeks. Left Eye ONLY, Disp: , Rfl:  .  sertraline (ZOLOFT) 50 MG tablet, Take 50 mg by mouth every evening. , Disp: , Rfl:  .  tamsulosin (FLOMAX) 0.4 MG CAPS capsule, Take 0.8 mg by mouth daily after supper. , Disp: , Rfl:  .  timolol (BETIMOL) 0.5 % ophthalmic solution, Place 1 drop into the right eye daily. , Disp: , Rfl:   Past Medical History: Past Medical History:  Diagnosis Date  . Acute appendicitis   . Anemia   . Anxiety   . Arthritis    "back" (03/14/2014)  . Blood dyscrasia    hairy cell leukemia  . CAD (coronary artery  disease)    a. 03/14/14  s/p overlapping DES x2 to mid-distal RCA.  . 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 Colon cancer ([place]) 1984  . Compression fracture of lumbar spine, non-traumatic ([place])   . DJD (degenerative joint disease) of lumbar spine   . Dyslipidemia   . Dyspnea   . Elevated PSA   . GERD (gastroesophageal reflux disease)   . Glaucoma   . Hairy cell leukemia ([place]) dx'd 1980  . Heart murmur   . History of blood transfusion    "several; related to hairy cell leukemia & tx "  . History of stomach ulcers 1968  . Hypertension   . Leukemia, hairy cell ([place])   . Malnutrition ([place])   . Osteoarthritis   .  Osteoporosis   . Pneumonia   . 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 Angelena Form and Cyndia Bent.   . Severe aortic stenosis    s/p tavr  . Skin cancer of face     Tobacco Use: Social History   Tobacco Use  Smoking Status Former Smoker  . Packs/day: 0.25  . Years: 1.00  . Pack years: 0.25  . Types: Cigarettes, Cigars  Smokeless Tobacco Never Used  Tobacco Comment   occasional social smoker during college.    Labs: Recent Review Flowsheet Data    Labs for ITP Cardiac and Pulmonary Rehab Latest Ref Rng & Units 09/06/2019 09/06/2019 09/06/2019 09/16/2019 09/17/2019   Cholestrol 0 - 200 mg/dL - - - - 96   LDLCALC 0 - 99 mg/dL - - - - 43   HDL >40 mg/dL - - - - 30(L)   Trlycerides <150 mg/dL - - - - 115   Hemoglobin A1c 4.8 - 5.6 % - - - - 6.6(H)   PHART 7.35 - 7.45 - - - - -   PCO2ART 32 - 48 mmHg - - - - -   HCO3 20.0 - 28.0 mmol/L - - - - -   TCO2 22 - 32 mmol/L 24 26 22 28  -   ACIDBASEDEF 0.0 - 2.0 mmol/L - - - - -   O2SAT % - - - - -      Capillary Blood Glucose: Lab Results  Component Value Date   GLUCAP 78 09/16/2019     Exercise Target Goals: Exercise Program Goal: Individual exercise prescription set using results from initial 6 min walk test and THRR while considering  patient's activity barriers and safety.   Exercise Prescription Goal: Starting with aerobic activity 30 plus minutes a day, 3 days per week for initial exercise prescription. Provide home exercise prescription and guidelines that participant acknowledges understanding prior to discharge.  Activity Barriers & Risk Stratification:  Activity Barriers & Cardiac Risk Stratification - 12/20/19 1259      Activity Barriers & Cardiac Risk Stratification   Activity Barriers Balance Concerns;Deconditioning;Shortness of Breath    Cardiac Risk Stratification High           6 Minute Walk:  6 Minute Walk    Row Name 12/20/19  1101         6 Minute Walk   Phase Initial     Distance 810 feet     Walk Time 6 minutes     # of Rest Breaks 2     MPH 1.5     METS 0.4     RPE 11  Perceived Dyspnea  1     VO2 Peak 1.43     Symptoms Yes (comment)     Comments SOB, RPD = 1. Took 2 rest breaks: 1:06 seconds and 30 seconds due to SOB     Resting HR 58 bpm     Resting BP 122/62     Resting Oxygen Saturation  96 %     Exercise Oxygen Saturation  during 6 min walk 92 %     Max Ex. HR 71 bpm     Max Ex. BP 132/72     2 Minute Post BP 110/10            Oxygen Initial Assessment:   Oxygen Re-Evaluation:   Oxygen Discharge (Final Oxygen Re-Evaluation):   Initial Exercise Prescription:  Initial Exercise Prescription - 12/20/19 1300      Date of Initial Exercise RX and Referring Provider   Date 12/20/19    Referring Provider Lauree Chandler, MD    Expected Discharge Date 02/17/20      NuStep   Level 1    SPM 75    Minutes 25    METs 1.8      Prescription Details   Frequency (times per week) 3    Duration Progress to 30 minutes of continuous aerobic without signs/symptoms of physical distress      Intensity   THRR 40-80% of Max Heartrate 51-102    Ratings of Perceived Exertion 11-13    Perceived Dyspnea 0-4      Progression   Progression Continue progressive overload as per policy without signs/symptoms or physical distress.      Resistance Training   Training Prescription Yes    Weight 2 lbs    Reps 10-15           Perform Capillary Blood Glucose checks as needed.  Exercise Prescription Changes:   Exercise Prescription Changes    Row Name 12/26/19 1000 01/09/20 1150 01/13/20 1400         Response to Exercise   Blood Pressure (Admit) 110/62 110/62 120/70     Blood Pressure (Exercise) 118/62 165/64 138/72     Blood Pressure (Exit) 114/70 110/60 110/68     Heart Rate (Admit) 61 bpm 71 bpm 67 bpm     Heart Rate (Exercise) 69 bpm 87 bpm 80 bpm     Heart Rate (Exit) 60  bpm 73 bpm 67 bpm     Rating of Perceived Exertion (Exercise) 11 11 10      Perceived Dyspnea (Exercise) 0 -- --     Symptoms None None None     Comments Pt's first day of exercise Reviewed METs Reviewed home exercise Rx     Duration Progress to 10 minutes continuous walking  at current work load and total walking time to 30-45 min Progress to 30 minutes of  aerobic without signs/symptoms of physical distress Continue with 30 min of aerobic exercise without signs/symptoms of physical distress.     Intensity THRR unchanged THRR New THRR unchanged       Progression   Progression Continue to progress workloads to maintain intensity without signs/symptoms of physical distress. Continue to progress workloads to maintain intensity without signs/symptoms of physical distress. Continue to progress workloads to maintain intensity without signs/symptoms of physical distress.     Average METs 1.8 2.8 2.7       Resistance Training   Training Prescription Yes Yes Yes     Weight 2lbs 2lbs 2 lbs  Reps 10-15 10-15 10-15     Time 10 Minutes 10 Minutes 10 Minutes       Interval Training   Interval Training No No No       NuStep   Level 1 2 3      SPM 75 95 90     Minutes 25 30 30      METs 1.8 2.8 2.7       Home Exercise Plan   Plans to continue exercise at -- -- Home (comment)     Frequency -- -- Add 3 additional days to program exercise sessions.     Initial Home Exercises Provided -- -- 01/13/20            Exercise Comments:   Exercise Comments    Row Name 12/26/19 1040 01/09/20 1153         Exercise Comments Pt's first day of exercise. Pt responded well to exercise prescription. Will continue to monitor and progress pt as tolerated. Reviewed METS. Pt is progressing well with no complaints.             Exercise Goals and Review:   Exercise Goals    Row Name 12/20/19 1301             Exercise Goals   Increase Physical Activity Yes       Intervention Provide advice,  education, support and counseling about physical activity/exercise needs.;Develop an individualized exercise prescription for aerobic and resistive training based on initial evaluation findings, risk stratification, comorbidities and participant's personal goals.       Expected Outcomes Short Term: Attend rehab on a regular basis to increase amount of physical activity.;Long Term: Add in home exercise to make exercise part of routine and to increase amount of physical activity.;Long Term: Exercising regularly at least 3-5 days a week.       Increase Strength and Stamina Yes       Intervention Provide advice, education, support and counseling about physical activity/exercise needs.;Develop an individualized exercise prescription for aerobic and resistive training based on initial evaluation findings, risk stratification, comorbidities and participant's personal goals.       Expected Outcomes Short Term: Increase workloads from initial exercise prescription for resistance, speed, and METs.;Short Term: Perform resistance training exercises routinely during rehab and add in resistance training at home;Long Term: Improve cardiorespiratory fitness, muscular endurance and strength as measured by increased METs and functional capacity (6MWT)       Able to understand and use rate of perceived exertion (RPE) scale Yes       Intervention Provide education and explanation on how to use RPE scale       Expected Outcomes Short Term: Able to use RPE daily in rehab to express subjective intensity level;Long Term:  Able to use RPE to guide intensity level when exercising independently       Knowledge and understanding of Target Heart Rate Range (THRR) Yes       Intervention Provide education and explanation of THRR including how the numbers were predicted and where they are located for reference       Expected Outcomes Short Term: Able to state/look up THRR;Short Term: Able to use daily as guideline for intensity in  rehab;Long Term: Able to use THRR to govern intensity when exercising independently       Understanding of Exercise Prescription Yes       Intervention Provide education, explanation, and written materials on patient's individual exercise prescription  Expected Outcomes Short Term: Able to explain program exercise prescription;Long Term: Able to explain home exercise prescription to exercise independently              Exercise Goals Re-Evaluation :  Exercise Goals Re-Evaluation    Row Name 12/26/19 1040 01/13/20 1622           Exercise Goal Re-Evaluation   Exercise Goals Review Able to understand and use rate of perceived exertion (RPE) scale;Knowledge and understanding of Target Heart Rate Range (THRR);Understanding of Exercise Prescription Increase Physical Activity;Increase Strength and Stamina;Able to understand and use rate of perceived exertion (RPE) scale;Knowledge and understanding of Target Heart Rate Range (THRR);Able to check pulse independently;Understanding of Exercise Prescription      Comments Pt's first day of exercise. Pt was able to exercise for 30 minutes with minimal difficulty. Will continue to monitor. Reviewed home exercise Rx with patient today. Pt is currently walking at home 3-4x/week for 30 minutes. Pt verbalized understanding of HEP and was provided a copy.      Expected Outcomes Pt will continue to increase his strength and stamina. Pt will continue to walk at home 3-4x/week for 30 minutes.              Discharge Exercise Prescription (Final Exercise Prescription Changes):  Exercise Prescription Changes - 01/13/20 1400      Response to Exercise   Blood Pressure (Admit) 120/70    Blood Pressure (Exercise) 138/72    Blood Pressure (Exit) 110/68    Heart Rate (Admit) 67 bpm    Heart Rate (Exercise) 80 bpm    Heart Rate (Exit) 67 bpm    Rating of Perceived Exertion (Exercise) 10    Symptoms None    Comments Reviewed home exercise Rx    Duration  Continue with 30 min of aerobic exercise without signs/symptoms of physical distress.    Intensity THRR unchanged      Progression   Progression Continue to progress workloads to maintain intensity without signs/symptoms of physical distress.    Average METs 2.7      Resistance Training   Training Prescription Yes    Weight 2 lbs    Reps 10-15    Time 10 Minutes      Interval Training   Interval Training No      NuStep   Level 3    SPM 90    Minutes 30    METs 2.7      Home Exercise Plan   Plans to continue exercise at Home (comment)    Frequency Add 3 additional days to program exercise sessions.    Initial Home Exercises Provided 01/13/20           Nutrition:  Target Goals: Understanding of nutrition guidelines, daily intake of sodium 1500mg , cholesterol 200mg , calories 30% from fat and 7% or less from saturated fats, daily to have 5 or more servings of fruits and vegetables.  Biometrics:  Pre Biometrics - 12/20/19 0900      Pre Biometrics   Waist Circumference 39.5 inches    Hip Circumference 41.25 inches    Waist to Hip Ratio 0.96 %    Triceps Skinfold 16 mm    % Body Fat 29.2 %    Grip Strength 21 kg    Flexibility 9.75 in    Single Leg Stand 2.3 seconds   High Fall Risk           Nutrition Therapy Plan and Nutrition Goals:  Nutrition Therapy &  Goals - 01/20/20 1112      Nutrition Therapy   Diet Heart Healthy      Personal Nutrition Goals   Nutrition Goal Pt to build a healthy plate including vegetables, fruits, whole grains, and low-fat dairy products in a heart healthy meal plan.    Personal Goal #2 Pt to choose palatable, easy to prepare/prepackaged fruits,whole grains, and veggies      Intervention Plan   Intervention Prescribe, educate and counsel regarding individualized specific dietary modifications aiming towards targeted core components such as weight, hypertension, lipid management, diabetes, heart failure and other comorbidities.     Expected Outcomes Short Term Goal: Understand basic principles of dietary content, such as calories, fat, sodium, cholesterol and nutrients.           Nutrition Assessments:  MEDIFICTS Score Key:  ?70 Need to make dietary changes   40-70 Heart Healthy Diet  ? 40 Therapeutic Level Cholesterol Diet   Picture Your Plate Scores:  <37 Unhealthy dietary pattern with much room for improvement.  41-50 Dietary pattern unlikely to meet recommendations for good health and room for improvement.  51-60 More healthful dietary pattern, with some room for improvement.   >60 Healthy dietary pattern, although there may be some specific behaviors that could be improved.    Nutrition Goals Re-Evaluation:  Nutrition Goals Re-Evaluation    [place] Name 01/20/20 1114             Goals   Current Weight 163 lb 9.3 oz (74.2 kg)       Nutrition Goal Pt to build a healthy plate including vegetables, fruits, whole grains, and low-fat dairy products in a heart healthy meal plan.         Personal Goal #2 Re-Evaluation   Personal Goal #2 Pt to choose palatable, easy to prepare/prepackaged fruits,whole grains, and veggies              Nutrition Goals Discharge (Final Nutrition Goals Re-Evaluation):  Nutrition Goals Re-Evaluation - 01/20/20 1114      Goals   Current Weight 163 lb 9.3 oz (74.2 kg)    Nutrition Goal Pt to build a healthy plate including vegetables, fruits, whole grains, and low-fat dairy products in a heart healthy meal plan.      Personal Goal #2 Re-Evaluation   Personal Goal #2 Pt to choose palatable, easy to prepare/prepackaged fruits,whole grains, and veggies           Psychosocial: Target Goals: Acknowledge presence or absence of significant depression and/or stress, maximize coping skills, provide positive support system. Participant is able to verbalize types and ability to use techniques and skills needed for reducing stress and depression.  Initial Review &  Psychosocial Screening:  Initial Psych Review & Screening - 12/20/19 1401      Initial Review   Current issues with Current Stress Concerns    Source of Stress Concerns Chronic Illness;Unable to perform yard/household activities      [place]? Yes   Arjan has his wife [name] for support     Barriers   Psychosocial barriers to participate in program There are no identifiable barriers or psychosocial needs.      Screening Interventions   Interventions Encouraged to exercise           Quality of Life Scores:  Quality of Life - 12/20/19 1226      Quality of Life   Select Quality of Life      Quality  of Life Scores   Health/Function Pre 21.54 %    Socioeconomic Pre 21 %    Psych/Spiritual Pre 26.29 %    Family Pre 26.4 %    GLOBAL Pre 23.17 %          Scores of 19 and below usually indicate a poorer quality of life in these areas.  A difference of  2-3 points is a clinically meaningful difference.  A difference of 2-3 points in the total score of the Quality of Life Index has been associated with significant improvement in overall quality of life, self-image, physical symptoms, and general health in studies assessing change in quality of life.  PHQ-9: Recent Review Flowsheet Data    Depression screen [facility] 2/9 12/20/2019 09/22/2019 08/07/2014 05/01/2014   Decreased Interest 0 0 0 0   Down, Depressed, Hopeless 0 0 0 0   PHQ - 2 Score 0 0 0 0     Interpretation of Total Score  Total Score Depression Severity:  1-4 = Minimal depression, 5-9 = Mild depression, 10-14 = Moderate depression, 15-19 = Moderately severe depression, 20-27 = Severe depression   Psychosocial Evaluation and Intervention:   Psychosocial Re-Evaluation:  Psychosocial Re-Evaluation    [place] Name 01/20/20 1353             Psychosocial Re-Evaluation   Current issues with Current Stress Concerns       Comments Jaxxon has not voiced any increased stressors or concerns at cardiac  rehab       Interventions Stress management education;Encouraged to attend Cardiac Rehabilitation for the exercise       Continue Psychosocial Services  No Follow up required         Initial Review   Source of Stress Concerns Chronic Illness;Unable to perform yard/household activities              Psychosocial Discharge (Final Psychosocial Re-Evaluation):  Psychosocial Re-Evaluation - 01/20/20 1353      Psychosocial Re-Evaluation   Current issues with Current Stress Concerns    Comments Diontae has not voiced any increased stressors or concerns at cardiac rehab    Interventions Stress management education;Encouraged to attend Cardiac Rehabilitation for the exercise    Continue Psychosocial Services  No Follow up required      Initial Review   Source of Stress Concerns Chronic Illness;Unable to perform yard/household activities           Vocational Rehabilitation: Provide vocational rehab assistance to qualifying candidates.   Vocational Rehab Evaluation & Intervention:  Vocational Rehab - 12/20/19 1402      Initial Vocational Rehab Evaluation & Intervention   Assessment shows need for Vocational Rehabilitation No      Vocational Rehab Re-Evaulation   Comments Trenell is retired and does not need vocational rehab at this time           Education: Education Goals: Education classes will be provided on a weekly basis, covering required topics. Participant will state understanding/return demonstration of topics presented.  Learning Barriers/Preferences:  Learning Barriers/Preferences - 12/20/19 1227      Learning Barriers/Preferences   Learning Barriers Sight;Hearing   Wears glasses and hearing aids   Learning Preferences None           Education Topics: Hypertension, Hypertension Reduction -Define heart disease and high blood pressure. Discus how high blood pressure affects the body and ways to reduce high blood pressure.   Exercise and Your Heart -Discuss why  it is important to exercise, the  FITT principles of exercise, normal and abnormal responses to exercise, and how to exercise safely.   Angina -Discuss definition of angina, causes of angina, treatment of angina, and how to decrease risk of having angina.   Cardiac Medications -Review what the following cardiac medications are used for, how they affect the body, and side effects that may occur when taking the medications.  Medications include Aspirin, Beta blockers, calcium channel blockers, ACE Inhibitors, angiotensin receptor blockers, diuretics, digoxin, and antihyperlipidemics.   Congestive Heart Failure -Discuss the definition of CHF, how to live with CHF, the signs and symptoms of CHF, and how keep track of weight and sodium intake.   Heart Disease and Intimacy -Discus the effect sexual activity has on the heart, how changes occur during intimacy as we age, and safety during sexual activity.   Smoking Cessation / COPD -Discuss different methods to quit smoking, the health benefits of quitting smoking, and the definition of COPD.   Nutrition I: Fats -Discuss the types of cholesterol, what cholesterol does to the heart, and how cholesterol levels can be controlled.   Nutrition II: Labels -Discuss the different components of food labels and how to read food label   Heart Parts/Heart Disease and PAD -Discuss the anatomy of the heart, the pathway of blood circulation through the heart, and these are affected by heart disease.   Stress I: Signs and Symptoms -Discuss the causes of stress, how stress may lead to anxiety and depression, and ways to limit stress.   Stress II: Relaxation -Discuss different types of relaxation techniques to limit stress.   Warning Signs of Stroke / TIA -Discuss definition of a stroke, what the signs and symptoms are of a stroke, and how to identify when someone is having stroke.   Knowledge Questionnaire Score:  Knowledge Questionnaire Score  - 12/20/19 1228      Knowledge Questionnaire Score   Pre Score 19/24           Core Components/Risk Factors/Patient Goals at Admission:  Personal Goals and Risk Factors at Admission - 12/20/19 1542      Core Components/Risk Factors/Patient Goals on Admission    Weight Management Weight Maintenance;Yes    Intervention Weight Management: Develop a combined nutrition and exercise program designed to reach desired caloric intake, while maintaining appropriate intake of nutrient and fiber, sodium and fats, and appropriate energy expenditure required for the weight goal.;Weight Management: Provide education and appropriate resources to help participant work on and attain dietary goals.    Expected Outcomes Short Term: Continue to assess and modify interventions until short term weight is achieved;Long Term: Adherence to nutrition and physical activity/exercise program aimed toward attainment of established weight goal;Weight Maintenance: Understanding of the daily nutrition guidelines, which includes 25-35% calories from fat, 7% or less cal from saturated fats, less than 200mg  cholesterol, less than 1.5gm of sodium, & 5 or more servings of fruits and vegetables daily;Understanding recommendations for meals to include 15-35% energy as protein, 25-35% energy from fat, 35-60% energy from carbohydrates, less than 200mg  of dietary cholesterol, 20-35 gm of total fiber daily;Understanding of distribution of calorie intake throughout the day with the consumption of 4-5 meals/snacks;Weight Gain: Understanding of general recommendations for a high calorie, high protein meal plan that promotes weight gain by distributing calorie intake throughout the day with the consumption for 4-5 meals, snacks, and/or supplements    Hypertension Yes    Intervention Provide education on lifestyle modifcations including regular physical activity/exercise, weight management, moderate sodium restriction and  increased consumption of  fresh fruit, vegetables, and low fat dairy, alcohol moderation, and smoking cessation.;Monitor prescription use compliance.    Expected Outcomes Short Term: Continued assessment and intervention until BP is < 140/61mm HG in hypertensive participants. < 130/40mm HG in hypertensive participants with diabetes, heart failure or chronic kidney disease.;Long Term: Maintenance of blood pressure at goal levels.    Lipids Yes    Intervention Provide education and support for participant on nutrition & aerobic/resistive exercise along with prescribed medications to achieve LDL 70mg , HDL >40mg .    Expected Outcomes Short Term: Participant states understanding of desired cholesterol values and is compliant with medications prescribed. Participant is following exercise prescription and nutrition guidelines.;Long Term: Cholesterol controlled with medications as prescribed, with individualized exercise RX and with personalized nutrition plan. Value goals: LDL < 70mg , HDL > 40 mg.    Stress Yes    Intervention Offer individual and/or small group education and counseling on adjustment to heart disease, stress management and health-related lifestyle change. Teach and support self-help strategies.;Refer participants experiencing significant psychosocial distress to appropriate mental health specialists for further evaluation and treatment. When possible, include family members and significant others in education/counseling sessions.    Expected Outcomes Short Term: Participant demonstrates changes in health-related behavior, relaxation and other stress management skills, ability to obtain effective social support, and compliance with psychotropic medications if prescribed.;Long Term: Emotional wellbeing is indicated by absence of clinically significant psychosocial distress or social isolation.           Core Components/Risk Factors/Patient Goals Review:   Goals and Risk Factor Review    Row Name 12/26/19 1457 01/20/20  1357           Core Components/Risk Factors/Patient Goals Review   Personal Goals Review Weight Management/Obesity;Lipids;Hypertension;Stress Weight Management/Obesity;Lipids;Hypertension;Stress      Review Aswad started exercise on 12/26/19. VSS. Patient did well with exercise Kasyn has been doing well with exercise at cardiac rehab. Argenis's vital signs have been stable. Mykah ahs been able to increase his workloads without complaints of SOB.      Expected Outcomes Sebastian will continue to participate in phase 2 exercise for exercise, nutrition and lifestyle modifications Kourosh will continue to participate in phase 2 exercise for exercise, nutrition and lifestyle modifications             Core Components/Risk Factors/Patient Goals at Discharge (Final Review):   Goals and Risk Factor Review - 01/20/20 1357      Core Components/Risk Factors/Patient Goals Review   Personal Goals Review Weight Management/Obesity;Lipids;Hypertension;Stress    Review Thierry has been doing well with exercise at cardiac rehab. Naim's vital signs have been stable. Dolton ahs been able to increase his workloads without complaints of SOB.    Expected Outcomes Esco will continue to participate in phase 2 exercise for exercise, nutrition and lifestyle modifications           ITP Comments:  ITP Comments    Row Name 12/20/19 1333 12/26/19 1449 01/20/20 1349       ITP Comments Dr Fransico Him MD, Medical Director 30 Day ITP Review. Patient started exercise on 12/26/19 and did well with exercise 30 Day ITP Review. Tahjay has good participation and attendance in phase 2 cardiac rehab            Comments: See ITP comments.Barnet Pall, RN,BSN 01/24/2020 10:56 AM

## 2020-01-24 DIAGNOSIS — S81811D Laceration without foreign body, right lower leg, subsequent encounter: Secondary | ICD-10-CM | POA: Diagnosis not present

## 2020-01-25 ENCOUNTER — Other Ambulatory Visit: Payer: Self-pay

## 2020-01-25 ENCOUNTER — Encounter (HOSPITAL_COMMUNITY)
Admission: RE | Admit: 2020-01-25 | Discharge: 2020-01-25 | Disposition: A | Discharge status: Home / Self Care | Payer: Medicare Other | Source: Ambulatory Visit | Attending: Interventional Cardiology | Admitting: Interventional Cardiology

## 2020-01-25 DIAGNOSIS — Z952 Presence of prosthetic heart valve: Secondary | ICD-10-CM

## 2020-01-27 ENCOUNTER — Encounter (HOSPITAL_COMMUNITY): Payer: Medicare Other

## 2020-01-30 ENCOUNTER — Encounter (HOSPITAL_COMMUNITY)
Admission: RE | Admit: 2020-01-30 | Discharge: 2020-01-30 | Disposition: A | Discharge status: Home / Self Care | Payer: Medicare Other | Source: Ambulatory Visit | Attending: Interventional Cardiology | Admitting: Interventional Cardiology

## 2020-01-30 ENCOUNTER — Other Ambulatory Visit: Payer: Self-pay

## 2020-01-30 DIAGNOSIS — Z952 Presence of prosthetic heart valve: Secondary | ICD-10-CM | POA: Diagnosis not present

## 2020-01-31 ENCOUNTER — Other Ambulatory Visit: Payer: Self-pay | Admitting: *Deleted

## 2020-01-31 DIAGNOSIS — H52201 Unspecified astigmatism, right eye: Secondary | ICD-10-CM | POA: Diagnosis not present

## 2020-01-31 DIAGNOSIS — H401431 Capsular glaucoma with pseudoexfoliation of lens, bilateral, mild stage: Secondary | ICD-10-CM | POA: Diagnosis not present

## 2020-01-31 DIAGNOSIS — Z961 Presence of intraocular lens: Secondary | ICD-10-CM | POA: Diagnosis not present

## 2020-01-31 NOTE — Patient Outreach (Signed)
[place] [facility]) Care Management  01/31/2020  EARSEL SHOUSE [birth year] [id number]   Telephone Assessment-Unsuccessful  RN attempted outreach call however unsuccessful. RN able to leave a HIPAA approved voice message requesting a call back.  Will rescheduled another outreach call over the next week for ongoing [facility] services.   [name], RN Care Management Coordinator [place] Office [phone]

## 2020-02-01 ENCOUNTER — Encounter (HOSPITAL_COMMUNITY)
Admission: RE | Admit: 2020-02-01 | Discharge: 2020-02-01 | Disposition: A | Discharge status: Home / Self Care | Payer: Medicare Other | Source: Ambulatory Visit | Attending: Interventional Cardiology | Admitting: Interventional Cardiology

## 2020-02-01 ENCOUNTER — Other Ambulatory Visit: Payer: Self-pay

## 2020-02-01 DIAGNOSIS — Z952 Presence of prosthetic heart valve: Secondary | ICD-10-CM | POA: Diagnosis not present

## 2020-02-03 ENCOUNTER — Other Ambulatory Visit: Payer: Self-pay

## 2020-02-03 ENCOUNTER — Encounter (HOSPITAL_COMMUNITY)
Admission: RE | Admit: 2020-02-03 | Discharge: 2020-02-03 | Disposition: A | Discharge status: Home / Self Care | Payer: Medicare Other | Source: Ambulatory Visit | Attending: Interventional Cardiology | Admitting: Interventional Cardiology

## 2020-02-03 DIAGNOSIS — Z952 Presence of prosthetic heart valve: Secondary | ICD-10-CM

## 2020-02-06 ENCOUNTER — Encounter (HOSPITAL_COMMUNITY)
Admission: RE | Admit: 2020-02-06 | Discharge: 2020-02-06 | Disposition: A | Discharge status: Home / Self Care | Payer: Medicare Other | Source: Ambulatory Visit | Attending: Interventional Cardiology | Admitting: Interventional Cardiology

## 2020-02-06 ENCOUNTER — Other Ambulatory Visit: Payer: Self-pay

## 2020-02-06 DIAGNOSIS — Z952 Presence of prosthetic heart valve: Secondary | ICD-10-CM

## 2020-02-07 ENCOUNTER — Other Ambulatory Visit: Payer: Self-pay | Admitting: *Deleted

## 2020-02-07 DIAGNOSIS — H353221 Exudative age-related macular degeneration, left eye, with active choroidal neovascularization: Secondary | ICD-10-CM | POA: Diagnosis not present

## 2020-02-07 DIAGNOSIS — S81811D Laceration without foreign body, right lower leg, subsequent encounter: Secondary | ICD-10-CM | POA: Diagnosis not present

## 2020-02-07 NOTE — Patient Outreach (Signed)
[place] [facility]) Care Management  02/07/2020  RAYSHAD RIVIELLO 1923/06/04 [id number]   Telephone Assessment-Successful  Unsuccessful outreach by RN today. Able to leave a HIPAA approved voice message requesting a call back.   Will rescheduled another outreach over the next week for ongoing [facility] services.  [name], RN Care Management Coordinator [place] Office [phone]

## 2020-02-08 ENCOUNTER — Other Ambulatory Visit: Payer: Self-pay

## 2020-02-08 ENCOUNTER — Encounter (HOSPITAL_COMMUNITY)
Admission: RE | Admit: 2020-02-08 | Discharge: 2020-02-08 | Disposition: A | Discharge status: Home / Self Care | Payer: Medicare Other | Source: Ambulatory Visit | Attending: Interventional Cardiology | Admitting: Interventional Cardiology

## 2020-02-08 DIAGNOSIS — Z952 Presence of prosthetic heart valve: Secondary | ICD-10-CM | POA: Diagnosis not present

## 2020-02-10 ENCOUNTER — Other Ambulatory Visit: Payer: Self-pay

## 2020-02-10 ENCOUNTER — Encounter (HOSPITAL_COMMUNITY)
Admission: RE | Admit: 2020-02-10 | Discharge: 2020-02-10 | Disposition: A | Discharge status: Home / Self Care | Payer: Medicare Other | Source: Ambulatory Visit | Attending: Interventional Cardiology | Admitting: Interventional Cardiology

## 2020-02-10 ENCOUNTER — Other Ambulatory Visit: Payer: Self-pay | Admitting: *Deleted

## 2020-02-10 DIAGNOSIS — Z952 Presence of prosthetic heart valve: Secondary | ICD-10-CM

## 2020-02-10 NOTE — Patient Outreach (Addendum)
[place] [facility]) Care Management  02/10/2020  CONWAY FEDORA January 22, 1924 [id number]  Telephone Assessment-Successful  RN spoke with [name]) who reports pt's is doing very good. States pt will finish cardiac rehab on 12/17 and BP reading are stable with the hospital readings at rehabilitation. Pt has meet his personal goal to ambulate around the block with no additional problems however pt has vision problems and ran into a stump in the yard and injured his leg. Pt visited the Urgent care for treatment. Due to the thin layer of skin tear treated with sterile strips. Spouse reports they have visited Dr. Philip Aspen nurse for dressing changes and ongoing antibiotics. Site continue to heal with daily bandage changes and no additional antibiotics.  States pt has started back driving some during the day with no reported issues or problems. States pt continue to received the eye shots to help with improve his vision.   Further discussion on pt's HTN as wife reports all readings have been good as pt obtain his readings while at cardiac rehabilitation. Appointments with his providers are spaced further out for next visits and they are pleased with his progress. Plan of care discussed as pt's spouse reports pt is doing a lot better with his fluid intake and consuming healthier food items. Verified pt continue to follow the plan of care with self management of care and offered any further tools or education to continue to encourage adherence. Will continue to praise pt for his progress and discuss possible transition to a health coach next month if pt continue to do well with his self management of care (receptive).   Goals Addressed            This Visit's Progress   . [facility]-Healthy Eating   On track    Follow Up Date 03/21/2020 Timeframe:  Long-Range Goal Priority:  Medium Start Date:     02/10/2020                        Expected End Date:    01/33/2022                     -  drink 6 to 8 glasses of water each day - limit fast food meals to no more than 1 per week - manage portion size - set a realistic goal - switch to low-fat or skim milk - switch to sugar-free drinks    Why is this important?   When you are ready to manage your nutrition or weight, having a plan and setting goals will help.  Taking small steps to change how you eat and exercise is a good place to start.    Notes: "Much improved"    . COMPLETED: [facility]-Track and Manage My Blood Pressure   On track    Follow Up Date 03/21/2020 Timeframe:  Short-Term Goal Priority:  Medium Start Date:     02/10/2020                        Expected End Date:       04/01/2020                 - check blood pressure 3 times per week - choose a place to take my blood pressure (home, clinic or office, retail store) - write blood pressure results in a log or diary    Why is this important?   You  won't feel high blood pressure, but it can still hurt your blood vessels.  High blood pressure can cause heart or kidney problems. It can also cause a stroke.  Making lifestyle changes like losing a little weight or eating less salt will help.  Checking your blood pressure at home and at different times of the day can help to control blood pressure.  If the doctor prescribes medicine remember to take it the way the doctor ordered.  Call the office if you cannot afford the medicine or if there are questions about it.     Notes:        [name], RN Care Management Coordinator [place] Office [phone]

## 2020-02-13 ENCOUNTER — Encounter (HOSPITAL_COMMUNITY)
Admission: RE | Admit: 2020-02-13 | Discharge: 2020-02-13 | Disposition: A | Discharge status: Home / Self Care | Payer: Medicare Other | Source: Ambulatory Visit | Attending: Interventional Cardiology | Admitting: Interventional Cardiology

## 2020-02-13 ENCOUNTER — Other Ambulatory Visit: Payer: Self-pay

## 2020-02-13 VITALS — Wt 161.4 lb

## 2020-02-13 DIAGNOSIS — Z952 Presence of prosthetic heart valve: Secondary | ICD-10-CM

## 2020-02-14 NOTE — Progress Notes (Signed)
Cardiac Individual Treatment Plan  Patient Details  Name: [name] MRN: [id number] Date of Birth: Jul 10, 1923 Referring Provider:   Flowsheet Row CARDIAC REHAB PHASE II ORIENTATION from 12/20/2019 in [facility]  Referring Provider Lauree Chandler, MD      Initial Encounter Date:  [place] PHASE II ORIENTATION from 12/20/2019 in [facility]  Date 12/20/19      Visit Diagnosis: S/P TAVR (transcatheter aortic valve replacement) 09/06/19  Patient's Home Medications on Admission:  Current Outpatient Medications:  .  acetaminophen (TYLENOL) 500 MG tablet, Take 1,000 mg by mouth every 8 (eight) hours as needed for moderate pain., Disp: , Rfl:  .  amoxicillin (AMOXIL) 500 MG tablet, Take 4 capsules (2,000 mg) one hour prior to all dental visits., Disp: 8 tablet, Rfl: 11 .  aspirin EC 81 MG EC tablet, Take 1 tablet (81 mg total) by mouth daily. Swallow whole., Disp: 30 tablet, Rfl: 11 .  atorvastatin (LIPITOR) 20 MG tablet, TAKE 1 TABLET BY MOUTH DAILY AT 6 PM, Disp: 90 tablet, Rfl: 2 .  Cholecalciferol (VITAMIN D3) 50 MCG (2000 UT) TABS, Take 2,000 Units by mouth every evening., Disp: , Rfl:  .  desonide (DESOWEN) 0.05 % cream, Apply 1 application topically 2 (two) times daily as needed (rash/irritation.). , Disp: , Rfl:  .  doxycycline (VIBRAMYCIN) 100 MG capsule, Take 100 mg by mouth every 12 (twelve) hours. (Patient not taking: Reported on 02/10/2020), Disp: , Rfl:  .  finasteride (PROSCAR) 5 MG tablet, Take 5 mg by mouth daily., Disp: , Rfl:  .  fluocinonide (LIDEX) 0.05 % external solution, Apply 1 application topically 2 (two) times daily as needed (apply to ears). , Disp: , Rfl: 11 .  furosemide (LASIX) 40 MG tablet, Take 0.5 tablets (20 mg total) by mouth daily., Disp: , Rfl:  .  latanoprost (XALATAN) 0.005 % ophthalmic solution, Place 1 drop into both eyes at bedtime. , Disp: , Rfl:  .   metoprolol tartrate (LOPRESSOR) 25 MG tablet, Take 0.5 tablets (12.5 mg total) by mouth daily., Disp: 90 tablet, Rfl: 2 .  Multiple Vitamins-Minerals (PRESERVISION AREDS 2 PO), Take 1 capsule by mouth 2 (two) times daily. , Disp: , Rfl:  .  nitroGLYCERIN (NITROSTAT) 0.4 MG SL tablet, Place 1 tablet (0.4 mg total) under the tongue every 5 (five) minutes as needed for chest pain. Please make yearly appt with Dr. Tamala Julian for September. 1st attempt, Disp: 25 tablet, Rfl: 2 .  pantoprazole (PROTONIX) 40 MG tablet, TAKE 1 TABLET BY MOUTH DAILY GENERIC EQUIVALENT FOR PROTONIX, Disp: 90 tablet, Rfl: 2 .  potassium chloride (KLOR-CON) 10 MEQ tablet, Take 1 tablet (10 mEq total) by mouth daily., Disp: 90 tablet, Rfl: 3 .  ranibizumab (LUCENTIS) 0.5 MG/0.05ML SOLN, 0.5 mg by Intravitreal route every 6 (six) weeks. Left Eye ONLY, Disp: , Rfl:  .  sertraline (ZOLOFT) 50 MG tablet, Take 50 mg by mouth every evening. , Disp: , Rfl:  .  tamsulosin (FLOMAX) 0.4 MG CAPS capsule, Take 0.8 mg by mouth daily after supper. , Disp: , Rfl:  .  timolol (BETIMOL) 0.5 % ophthalmic solution, Place 1 drop into the right eye daily. , Disp: , Rfl:   Past Medical History: Past Medical History:  Diagnosis Date  . Acute appendicitis   . Anemia   . Anxiety   . Arthritis    "back" (03/14/2014)  . Blood dyscrasia    hairy cell  leukemia  . CAD (coronary artery disease)    a. 03/14/14  s/p overlapping DES x2 to mid-distal RCA.  . 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 Colon cancer ([place]) 1984  . Compression fracture of lumbar spine, non-traumatic ([place])   . DJD (degenerative joint disease) of lumbar spine   . Dyslipidemia   . Dyspnea   . Elevated PSA   . GERD (gastroesophageal reflux disease)   . Glaucoma   . Hairy cell leukemia ([place]) dx'd 1980  . Heart murmur   . History of blood transfusion    "several; related to hairy cell leukemia & tx "  . History of stomach ulcers 1968  . Hypertension   .  Leukemia, hairy cell ([place])   . Malnutrition ([place])   . Osteoarthritis   . Osteoporosis   . Pneumonia   . 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 Angelena Form and Cyndia Bent.   . Severe aortic stenosis    s/p tavr  . Skin cancer of face     Tobacco Use: Social History   Tobacco Use  Smoking Status Former Smoker  . Packs/day: 0.25  . Years: 1.00  . Pack years: 0.25  . Types: Cigarettes, Cigars  Smokeless Tobacco Never Used  Tobacco Comment   occasional social smoker during college.    Labs: Recent Review Flowsheet Data    Labs for ITP Cardiac and Pulmonary Rehab Latest Ref Rng & Units 09/06/2019 09/06/2019 09/06/2019 09/16/2019 09/17/2019   Cholestrol 0 - 200 mg/dL - - - - 96   LDLCALC 0 - 99 mg/dL - - - - 43   HDL >40 mg/dL - - - - 30(L)   Trlycerides <150 mg/dL - - - - 115   Hemoglobin A1c 4.8 - 5.6 % - - - - 6.6(H)   PHART 7.350 - 7.450 - - - - -   PCO2ART 32.0 - 48.0 mmHg - - - - -   HCO3 20.0 - 28.0 mmol/L - - - - -   TCO2 22 - 32 mmol/L '24 26 22 28 ' -   ACIDBASEDEF 0.0 - 2.0 mmol/L - - - - -   O2SAT % - - - - -      Capillary Blood Glucose: Lab Results  Component Value Date   GLUCAP 78 09/16/2019     Exercise Target Goals: Exercise Program Goal: Individual exercise prescription set using results from initial 6 min walk test and THRR while considering  patient's activity barriers and safety.   Exercise Prescription Goal: Starting with aerobic activity 30 plus minutes a day, 3 days per week for initial exercise prescription. Provide home exercise prescription and guidelines that participant acknowledges understanding prior to discharge.  Activity Barriers & Risk Stratification:  Activity Barriers & Cardiac Risk Stratification - 12/20/19 1259      Activity Barriers & Cardiac Risk Stratification   Activity Barriers Balance Concerns;Deconditioning;Shortness of Breath    Cardiac Risk  Stratification High           6 Minute Walk:  6 Minute Walk    Row Name 12/20/19 1101 02/13/20 1624       6 Minute Walk   Phase Initial Discharge    Distance 810 feet 1176 feet    Distance % Change -- 45 %    Distance Feet Change -- 366 ft    Walk Time 6 minutes 6 minutes    #  of Rest Breaks 2 0    MPH 1.5 2.22    METS 0.4 1.28    RPE 11 11    Perceived Dyspnea  1 2    VO2 Peak 1.43 4.51    Symptoms Yes (comment) No    Comments SOB, RPD = 1. Took 2 rest breaks: 1:06 seconds and 30 seconds due to SOB SOB RPD = 2. No breaks    Resting HR 58 bpm 70 bpm    Resting BP 122/62 120/62    Resting Oxygen Saturation  96 % --    Exercise Oxygen Saturation  during 6 min walk 92 % --    Max Ex. HR 71 bpm 90 bpm    Max Ex. BP 132/72 146/70    2 Minute Post BP 110/10 --           Oxygen Initial Assessment:   Oxygen Re-Evaluation:   Oxygen Discharge (Final Oxygen Re-Evaluation):   Initial Exercise Prescription:  Initial Exercise Prescription - 12/20/19 1300      Date of Initial Exercise RX and Referring Provider   Date 12/20/19    Referring Provider Lauree Chandler, MD    Expected Discharge Date 02/17/20      NuStep   Level 1    SPM 75    Minutes 25    METs 1.8      Prescription Details   Frequency (times per week) 3    Duration Progress to 30 minutes of continuous aerobic without signs/symptoms of physical distress      Intensity   THRR 40-80% of Max Heartrate 51-102    Ratings of Perceived Exertion 11-13    Perceived Dyspnea 0-4      Progression   Progression Continue progressive overload as per policy without signs/symptoms or physical distress.      Resistance Training   Training Prescription Yes    Weight 2 lbs    Reps 10-15           Perform Capillary Blood Glucose checks as needed.  Exercise Prescription Changes:  Exercise Prescription Changes    Row Name 12/26/19 1000 01/09/20 1150 01/13/20 1400 02/01/20 1450       Response to  Exercise   Blood Pressure (Admit) 110/62 110/62 120/70 110/70    Blood Pressure (Exercise) 118/62 165/64 138/72 130/70    Blood Pressure (Exit) 114/70 110/60 110/68 118/72    Heart Rate (Admit) 61 bpm 71 bpm 67 bpm 71 bpm    Heart Rate (Exercise) 69 bpm 87 bpm 80 bpm 82 bpm    Heart Rate (Exit) 60 bpm 73 bpm 67 bpm 71 bpm    Rating of Perceived Exertion (Exercise) '11 11 10 12    ' Perceived Dyspnea (Exercise) 0 -- -- --    Symptoms None None None None    Comments Pt's first day of exercise Reviewed METs Reviewed home exercise Rx Reviewed METs    Duration Progress to 10 minutes continuous walking  at current work load and total walking time to 30-45 min Progress to 30 minutes of  aerobic without signs/symptoms of physical distress Continue with 30 min of aerobic exercise without signs/symptoms of physical distress. Continue with 30 min of aerobic exercise without signs/symptoms of physical distress.    Intensity THRR unchanged THRR New THRR unchanged THRR unchanged         Progression   Progression Continue to progress workloads to maintain intensity without signs/symptoms of physical distress. Continue to progress workloads to maintain intensity without  signs/symptoms of physical distress. Continue to progress workloads to maintain intensity without signs/symptoms of physical distress. Continue to progress workloads to maintain intensity without signs/symptoms of physical distress.    Average METs 1.8 2.8 2.7 2.5         Resistance Training   Training Prescription Yes Yes Yes No    Weight 2lbs 2lbs 2 lbs No weights on Wednesdays    Reps 10-15 10-15 10-15 --    Time 10 Minutes 10 Minutes 10 Minutes --         Interval Training   Interval Training No No No No         NuStep   Level '1 2 3 3    ' SPM 75 95 90 90    Minutes '25 30 30 30    ' METs 1.8 2.8 2.7 2.5         Home Exercise Plan   Plans to continue exercise at -- -- Home (comment) Home (comment)    Frequency -- -- Add 3  additional days to program exercise sessions. Add 3 additional days to program exercise sessions.    Initial Home Exercises Provided -- -- 01/13/20 01/13/20           Exercise Comments:  Exercise Comments    Row Name 12/26/19 1040 01/09/20 1153 02/09/20 1453       Exercise Comments Pt's first day of exercise. Pt responded well to exercise prescription. Will continue to monitor and progress pt as tolerated. Reviewed METS. Pt is progressing well with no complaints. Reviewed METs. Pt's MET levels are not consitant and based on his energy level. The pt does report that he is walking longer distances without getting out of breath. Pt voices this makes him very happy because this was a problem fro hom prior to the TAVR.            Exercise Goals and Review:  Exercise Goals    Row Name 12/20/19 1301             Exercise Goals   Increase Physical Activity Yes       Intervention Provide advice, education, support and counseling about physical activity/exercise needs.;Develop an individualized exercise prescription for aerobic and resistive training based on initial evaluation findings, risk stratification, comorbidities and participant's personal goals.       Expected Outcomes Short Term: Attend rehab on a regular basis to increase amount of physical activity.;Long Term: Add in home exercise to make exercise part of routine and to increase amount of physical activity.;Long Term: Exercising regularly at least 3-5 days a week.       Increase Strength and Stamina Yes       Intervention Provide advice, education, support and counseling about physical activity/exercise needs.;Develop an individualized exercise prescription for aerobic and resistive training based on initial evaluation findings, risk stratification, comorbidities and participant's personal goals.       Expected Outcomes Short Term: Increase workloads from initial exercise prescription for resistance, speed, and METs.;Short Term:  Perform resistance training exercises routinely during rehab and add in resistance training at home;Long Term: Improve cardiorespiratory fitness, muscular endurance and strength as measured by increased METs and functional capacity (6MWT)       Able to understand and use rate of perceived exertion (RPE) scale Yes       Intervention Provide education and explanation on how to use RPE scale       Expected Outcomes Short Term: Able to use RPE daily in rehab to  express subjective intensity level;Long Term:  Able to use RPE to guide intensity level when exercising independently       Knowledge and understanding of Target Heart Rate Range (THRR) Yes       Intervention Provide education and explanation of THRR including how the numbers were predicted and where they are located for reference       Expected Outcomes Short Term: Able to state/look up THRR;Short Term: Able to use daily as guideline for intensity in rehab;Long Term: Able to use THRR to govern intensity when exercising independently       Understanding of Exercise Prescription Yes       Intervention Provide education, explanation, and written materials on patient's individual exercise prescription       Expected Outcomes Short Term: Able to explain program exercise prescription;Long Term: Able to explain home exercise prescription to exercise independently              Exercise Goals Re-Evaluation :  Exercise Goals Re-Evaluation    Row Name 12/26/19 1040 01/13/20 1622           Exercise Goal Re-Evaluation   Exercise Goals Review Able to understand and use rate of perceived exertion (RPE) scale;Knowledge and understanding of Target Heart Rate Range (THRR);Understanding of Exercise Prescription Increase Physical Activity;Increase Strength and Stamina;Able to understand and use rate of perceived exertion (RPE) scale;Knowledge and understanding of Target Heart Rate Range (THRR);Able to check pulse independently;Understanding of Exercise  Prescription      Comments Pt's first day of exercise. Pt was able to exercise for 30 minutes with minimal difficulty. Will continue to monitor. Reviewed home exercise Rx with patient today. Pt is currently walking at home 3-4x/week for 30 minutes. Pt verbalized understanding of HEP and was provided a copy.      Expected Outcomes Pt will continue to increase his strength and stamina. Pt will continue to walk at home 3-4x/week for 30 minutes.              Discharge Exercise Prescription (Final Exercise Prescription Changes):  Exercise Prescription Changes - 02/01/20 1450      Response to Exercise   Blood Pressure (Admit) 110/70    Blood Pressure (Exercise) 130/70    Blood Pressure (Exit) 118/72    Heart Rate (Admit) 71 bpm    Heart Rate (Exercise) 82 bpm    Heart Rate (Exit) 71 bpm    Rating of Perceived Exertion (Exercise) 12    Symptoms None    Comments Reviewed METs    Duration Continue with 30 min of aerobic exercise without signs/symptoms of physical distress.    Intensity THRR unchanged      Progression   Progression Continue to progress workloads to maintain intensity without signs/symptoms of physical distress.    Average METs 2.5      Resistance Training   Training Prescription No    Weight No weights on Wednesdays      Interval Training   Interval Training No      NuStep   Level 3    SPM 90    Minutes 30    METs 2.5      Home Exercise Plan   Plans to continue exercise at Home (comment)    Frequency Add 3 additional days to program exercise sessions.    Initial Home Exercises Provided 01/13/20           Nutrition:  Target Goals: Understanding of nutrition guidelines, daily intake of sodium <1568m, cholesterol <2064m  calories 30% from fat and 7% or less from saturated fats, daily to have 5 or more servings of fruits and vegetables.  Biometrics:  Pre Biometrics - 12/20/19 0900      Pre Biometrics   Waist Circumference 39.5 inches    Hip Circumference  41.25 inches    Waist to Hip Ratio 0.96 %    Triceps Skinfold 16 mm    % Body Fat 29.2 %    Grip Strength 21 kg    Flexibility 9.75 in    Single Leg Stand 2.3 seconds   High Fall Risk           Nutrition Therapy Plan and Nutrition Goals:  Nutrition Therapy & Goals - 01/20/20 1112      Nutrition Therapy   Diet Heart Healthy      Personal Nutrition Goals   Nutrition Goal Pt to build a healthy plate including vegetables, fruits, whole grains, and low-fat dairy products in a heart healthy meal plan.    Personal Goal #2 Pt to choose palatable, easy to prepare/prepackaged fruits,whole grains, and veggies      Intervention Plan   Intervention Prescribe, educate and counsel regarding individualized specific dietary modifications aiming towards targeted core components such as weight, hypertension, lipid management, diabetes, heart failure and other comorbidities.    Expected Outcomes Short Term Goal: Understand basic principles of dietary content, such as calories, fat, sodium, cholesterol and nutrients.           Nutrition Assessments:  MEDIFICTS Score Key:  ?70 Need to make dietary changes   40-70 Heart Healthy Diet  ? 40 Therapeutic Level Cholesterol Diet   Picture Your Plate Scores:  <46 Unhealthy dietary pattern with much room for improvement.  41-50 Dietary pattern unlikely to meet recommendations for good health and room for improvement.  51-60 More healthful dietary pattern, with some room for improvement.   >60 Healthy dietary pattern, although there may be some specific behaviors that could be improved.    Nutrition Goals Re-Evaluation:  Nutrition Goals Re-Evaluation    [place] Name 01/20/20 1114 02/14/20 0744           Goals   Current Weight 163 lb 9.3 oz (74.2 kg) 161 lb 6 oz (73.2 kg)      Nutrition Goal Pt to build a healthy plate including vegetables, fruits, whole grains, and low-fat dairy products in a heart healthy meal plan. Pt to build a healthy  plate including vegetables, fruits, whole grains, and low-fat dairy products in a heart healthy meal plan.      Comment -- Will evaluate nutrition goals with pt post MEDFICTS survey.             Personal Goal #2 Re-Evaluation   Personal Goal #2 Pt to choose palatable, easy to prepare/prepackaged fruits,whole grains, and veggies Pt to choose palatable, easy to prepare/prepackaged fruits,whole grains, and veggies             Nutrition Goals Discharge (Final Nutrition Goals Re-Evaluation):  Nutrition Goals Re-Evaluation - 02/14/20 0744      Goals   Current Weight 161 lb 6 oz (73.2 kg)    Nutrition Goal Pt to build a healthy plate including vegetables, fruits, whole grains, and low-fat dairy products in a heart healthy meal plan.    Comment Will evaluate nutrition goals with pt post MEDFICTS survey.      Personal Goal #2 Re-Evaluation   Personal Goal #2 Pt to choose palatable, easy to prepare/prepackaged fruits,whole grains, and  veggies           Psychosocial: Target Goals: Acknowledge presence or absence of significant depression and/or stress, maximize coping skills, provide positive support system. Participant is able to verbalize types and ability to use techniques and skills needed for reducing stress and depression.  Initial Review & Psychosocial Screening:  Initial Psych Review & Screening - 12/20/19 1401      Initial Review   Current issues with Current Stress Concerns    Source of Stress Concerns Chronic Illness;Unable to perform yard/household activities      [place]? Yes   Loyal has his wife [name] for support     Barriers   Psychosocial barriers to participate in program There are no identifiable barriers or psychosocial needs.      Screening Interventions   Interventions Encouraged to exercise           Quality of Life Scores:  Quality of Life - 02/13/20 1623      Quality of Life Scores   Health/Function Post 23 %     Socioeconomic Post 24.31 %    Psych/Spiritual Post 24.64 %    Family Post 25.2 %    GLOBAL Post 23.94 %          Scores of 19 and below usually indicate a poorer quality of life in these areas.  A difference of  2-3 points is a clinically meaningful difference.  A difference of 2-3 points in the total score of the Quality of Life Index has been associated with significant improvement in overall quality of life, self-image, physical symptoms, and general health in studies assessing change in quality of life.  PHQ-9: Recent Review Flowsheet Data    Depression screen [facility] 2/9 12/20/2019 09/22/2019 08/07/2014 05/01/2014   Decreased Interest 0 0 0 0   Down, Depressed, Hopeless 0 0 0 0   PHQ - 2 Score 0 0 0 0     Interpretation of Total Score  Total Score Depression Severity:  1-4 = Minimal depression, 5-9 = Mild depression, 10-14 = Moderate depression, 15-19 = Moderately severe depression, 20-27 = Severe depression   Psychosocial Evaluation and Intervention:   Psychosocial Re-Evaluation:  Psychosocial Re-Evaluation    Row Name 01/20/20 1353 02/14/20 0830           Psychosocial Re-Evaluation   Current issues with Current Stress Concerns Current Stress Concerns      Comments [place] has not voiced any increased stressors or concerns at cardiac rehab [place] has not voiced any increased stressors or concerns at cardiac rehab      Interventions Stress management education;Encouraged to attend Cardiac Rehabilitation for the exercise Stress management education;Encouraged to attend Cardiac Rehabilitation for the exercise      Continue Psychosocial Services  No Follow up required No Follow up required             Initial Review   Source of Stress Concerns Chronic Illness;Unable to perform yard/household activities Chronic Illness;Unable to perform yard/household activities             Psychosocial Discharge (Final Psychosocial Re-Evaluation):  Psychosocial Re-Evaluation - 02/14/20 0830       Psychosocial Re-Evaluation   Current issues with Current Stress Concerns    Comments [place] has not voiced any increased stressors or concerns at cardiac rehab    Interventions Stress management education;Encouraged to attend Cardiac Rehabilitation for the exercise    Continue Psychosocial Services  No Follow up required  Initial Review   Source of Stress Concerns Chronic Illness;Unable to perform yard/household activities           Vocational Rehabilitation: Provide vocational rehab assistance to qualifying candidates.   Vocational Rehab Evaluation & Intervention:  Vocational Rehab - 12/20/19 1402      Initial Vocational Rehab Evaluation & Intervention   Assessment shows need for Vocational Rehabilitation No      Vocational Rehab Re-Evaulation   Comments [place] is retired and does not need vocational rehab at this time           Education: Education Goals: Education classes will be provided on a weekly basis, covering required topics. Participant will state understanding/return demonstration of topics presented.  Learning Barriers/Preferences:  Learning Barriers/Preferences - 12/20/19 1227      Learning Barriers/Preferences   Learning Barriers Sight;Hearing   Wears glasses and hearing aids   Learning Preferences None           Education Topics: Hypertension, Hypertension Reduction -Define heart disease and high blood pressure. Discus how high blood pressure affects the body and ways to reduce high blood pressure.   Exercise and Your Heart -Discuss why it is important to exercise, the FITT principles of exercise, normal and abnormal responses to exercise, and how to exercise safely.   Angina -Discuss definition of angina, causes of angina, treatment of angina, and how to decrease risk of having angina.   Cardiac Medications -Review what the following cardiac medications are used for, how they affect the body, and side effects that may occur when taking  the medications.  Medications include Aspirin, Beta blockers, calcium channel blockers, ACE Inhibitors, angiotensin receptor blockers, diuretics, digoxin, and antihyperlipidemics.   Congestive Heart Failure -Discuss the definition of CHF, how to live with CHF, the signs and symptoms of CHF, and how keep track of weight and sodium intake.   Heart Disease and Intimacy -Discus the effect sexual activity has on the heart, how changes occur during intimacy as we age, and safety during sexual activity.   Smoking Cessation / COPD -Discuss different methods to quit smoking, the health benefits of quitting smoking, and the definition of COPD.   Nutrition I: Fats -Discuss the types of cholesterol, what cholesterol does to the heart, and how cholesterol levels can be controlled.   Nutrition II: Labels -Discuss the different components of food labels and how to read food label   Heart Parts/Heart Disease and PAD -Discuss the anatomy of the heart, the pathway of blood circulation through the heart, and these are affected by heart disease.   Stress I: Signs and Symptoms -Discuss the causes of stress, how stress may lead to anxiety and depression, and ways to limit stress.   Stress II: Relaxation -Discuss different types of relaxation techniques to limit stress.   Warning Signs of Stroke / TIA -Discuss definition of a stroke, what the signs and symptoms are of a stroke, and how to identify when someone is having stroke.   Knowledge Questionnaire Score:  Knowledge Questionnaire Score - 02/13/20 1623      Knowledge Questionnaire Score   Post Score 22/24           Core Components/Risk Factors/Patient Goals at Admission:  Personal Goals and Risk Factors at Admission - 12/20/19 1542      Core Components/Risk Factors/Patient Goals on Admission    Weight Management Weight Maintenance;Yes    Intervention Weight Management: Develop a combined nutrition and exercise program designed to  reach desired caloric intake, while  maintaining appropriate intake of nutrient and fiber, sodium and fats, and appropriate energy expenditure required for the weight goal.;Weight Management: Provide education and appropriate resources to help participant work on and attain dietary goals.    Expected Outcomes Short Term: Continue to assess and modify interventions until short term weight is achieved;Long Term: Adherence to nutrition and physical activity/exercise program aimed toward attainment of established weight goal;Weight Maintenance: Understanding of the daily nutrition guidelines, which includes 25-35% calories from fat, 7% or less cal from saturated fats, less than 247m cholesterol, less than 1.5gm of sodium, & 5 or more servings of fruits and vegetables daily;Understanding recommendations for meals to include 15-35% energy as protein, 25-35% energy from fat, 35-60% energy from carbohydrates, less than 2071mof dietary cholesterol, 20-35 gm of total fiber daily;Understanding of distribution of calorie intake throughout the day with the consumption of 4-5 meals/snacks;Weight Gain: Understanding of general recommendations for a high calorie, high protein meal plan that promotes weight gain by distributing calorie intake throughout the day with the consumption for 4-5 meals, snacks, and/or supplements    Hypertension Yes    Intervention Provide education on lifestyle modifcations including regular physical activity/exercise, weight management, moderate sodium restriction and increased consumption of fresh fruit, vegetables, and low fat dairy, alcohol moderation, and smoking cessation.;Monitor prescription use compliance.    Expected Outcomes Short Term: Continued assessment and intervention until BP is < 140/9085mG in hypertensive participants. < 130/46m77m in hypertensive participants with diabetes, heart failure or chronic kidney disease.;Long Term: Maintenance of blood pressure at goal levels.     Lipids Yes    Intervention Provide education and support for participant on nutrition & aerobic/resistive exercise along with prescribed medications to achieve LDL <70mg10mL >40mg.16mExpected Outcomes Short Term: Participant states understanding of desired cholesterol values and is compliant with medications prescribed. Participant is following exercise prescription and nutrition guidelines.;Long Term: Cholesterol controlled with medications as prescribed, with individualized exercise RX and with personalized nutrition plan. Value goals: LDL < 70mg, 85m> 40 mg.    Stress Yes    Intervention Offer individual and/or small group education and counseling on adjustment to heart disease, stress management and health-related lifestyle change. Teach and support self-help strategies.;Refer participants experiencing significant psychosocial distress to appropriate mental health specialists for further evaluation and treatment. When possible, include family members and significant others in education/counseling sessions.    Expected Outcomes Short Term: Participant demonstrates changes in health-related behavior, relaxation and other stress management skills, ability to obtain effective social support, and compliance with psychotropic medications if prescribed.;Long Term: Emotional wellbeing is indicated by absence of clinically significant psychosocial distress or social isolation.           Core Components/Risk Factors/Patient Goals Review:   Goals and Risk Factor Review    Row Name 12/26/19 1457 01/20/20 1357 02/14/20 0830         Core Components/Risk Factors/Patient Goals Review   Personal Goals Review Weight Management/Obesity;Lipids;Hypertension;Stress Weight Management/Obesity;Lipids;Hypertension;Stress Weight Management/Obesity;Lipids;Hypertension;Stress     Review Mahesh sAsberryd exercise on 12/26/19. VSS. Patient did well with exercise Jarick hGiulianen doing well with exercise at cardiac rehab.  Bejamin's vital signs have been stable. Ranveer aKoletonen able to increase his workloads without complaints of SOB. Micael hTelesforoen doing well with exercise at cardiac rehab. Heaven's vital signs have been stable. Dontrell aAtholen able to increase his workloads without complaints of SOB.     Expected Outcomes Arshdeep wMatisontinue to participate in phase  2 exercise for exercise, nutrition and lifestyle modifications Coty will continue to participate in phase 2 exercise for exercise, nutrition and lifestyle modifications Sara will continue to participate in phase 2 exercise for exercise, nutrition and lifestyle modifications. Meshulem completes phase 2 cardiac rehab at the end of the week            Core Components/Risk Factors/Patient Goals at Discharge (Final Review):   Goals and Risk Factor Review - 02/14/20 0830      Core Components/Risk Factors/Patient Goals Review   Personal Goals Review Weight Management/Obesity;Lipids;Hypertension;Stress    Review Shakir has been doing well with exercise at cardiac rehab. Bacilio's vital signs have been stable. Wren ahs been able to increase his workloads without complaints of SOB.    Expected Outcomes Derren will continue to participate in phase 2 exercise for exercise, nutrition and lifestyle modifications. Bynum completes phase 2 cardiac rehab at the end of the week           ITP Comments:  ITP Comments    Row Name 12/20/19 1333 12/26/19 1449 01/20/20 1349 02/14/20 0830     ITP Comments Dr Fransico Him MD, Medical Director 30 Day ITP Review. Patient started exercise on 12/26/19 and did well with exercise 30 Day ITP Review. Arael has good participation and attendance in phase 2 cardiac rehab 30 Day ITP Review. Hodge has good participation and attendance in phase 2 cardiac rehab           Comments: See ITP comments.Barnet Pall, RN,BSN 02/14/2020 8:33 AM

## 2020-02-15 ENCOUNTER — Encounter (HOSPITAL_COMMUNITY)
Admission: RE | Admit: 2020-02-15 | Discharge: 2020-02-15 | Disposition: A | Discharge status: Home / Self Care | Payer: Medicare Other | Source: Ambulatory Visit | Attending: Interventional Cardiology | Admitting: Interventional Cardiology

## 2020-02-15 ENCOUNTER — Other Ambulatory Visit: Payer: Self-pay

## 2020-02-15 VITALS — Ht 64.0 in | Wt 163.8 lb

## 2020-02-15 DIAGNOSIS — Z952 Presence of prosthetic heart valve: Secondary | ICD-10-CM

## 2020-02-16 ENCOUNTER — Telehealth (HOSPITAL_COMMUNITY): Payer: Self-pay | Admitting: Internal Medicine

## 2020-02-17 ENCOUNTER — Other Ambulatory Visit: Payer: Self-pay

## 2020-02-17 ENCOUNTER — Encounter (HOSPITAL_COMMUNITY)
Admission: RE | Admit: 2020-02-17 | Discharge: 2020-02-17 | Disposition: A | Discharge status: Home / Self Care | Payer: Medicare Other | Source: Ambulatory Visit | Attending: Interventional Cardiology | Admitting: Interventional Cardiology

## 2020-02-17 DIAGNOSIS — Z952 Presence of prosthetic heart valve: Secondary | ICD-10-CM

## 2020-02-17 NOTE — Progress Notes (Signed)
Discharge Progress Report  Patient Details  Name: [name] MRN: [id number] Date of Birth: Mar 28, 1923 Referring Provider:   Flowsheet Row CARDIAC REHAB PHASE II ORIENTATION from 12/20/2019 in [facility]  Referring Provider Lauree Chandler, MD       Number of Visits: 23  Reason for Discharge:  Patient reached a stable level of exercise. Patient independent in their exercise. Patient has met program and personal goals.  Smoking History:  Social History   Tobacco Use  Smoking Status Former Smoker  . Packs/day: 0.25  . Years: 1.00  . Pack years: 0.25  . Types: Cigarettes, Cigars  Smokeless Tobacco Never Used  Tobacco Comment   occasional social smoker during college.    Diagnosis:  S/P TAVR (transcatheter aortic valve replacement) 09/06/19  ADL [facility]:   Initial Exercise Prescription:  Initial Exercise Prescription - 12/20/19 1300      Date of Initial Exercise RX and Referring Provider   Date 12/20/19    Referring Provider Lauree Chandler, MD    Expected Discharge Date 02/17/20      NuStep   Level 1    SPM 75    Minutes 25    METs 1.8      Prescription Details   Frequency (times per week) 3    Duration Progress to 30 minutes of continuous aerobic without signs/symptoms of physical distress      Intensity   THRR 40-80% of Max Heartrate 51-102    Ratings of Perceived Exertion 11-13    Perceived Dyspnea 0-4      Progression   Progression Continue progressive overload as per policy without signs/symptoms or physical distress.      Resistance Training   Training Prescription Yes    Weight 2 lbs    Reps 10-15           Discharge Exercise Prescription (Final Exercise Prescription Changes):  Exercise Prescription Changes - 02/17/20 1104      Response to Exercise   Blood Pressure (Admit) 110/60    Blood Pressure (Exercise) 138/62    Blood Pressure (Exit) 118/68    Heart Rate (Admit) 74 bpm    Heart Rate  (Exercise) 108 bpm    Heart Rate (Exit) 81 bpm    Rating of Perceived Exertion (Exercise) 11    Symptoms None    Comments Pt graduated for the CRP2 program today.    Duration Continue with 30 min of aerobic exercise without signs/symptoms of physical distress.    Intensity THRR unchanged      Progression   Progression Continue to progress workloads to maintain intensity without signs/symptoms of physical distress.    Average METs 3.1      Resistance Training   Training Prescription Yes    Weight 2 lbs    Reps 10-15    Time 10 Minutes      Interval Training   Interval Training No      NuStep   Level 3    SPM 100    Minutes 30    METs 3.1      Home Exercise Plan   Plans to continue exercise at Home (comment)    Frequency Add 3 additional days to program exercise sessions.    Initial Home Exercises Provided 01/13/20           Functional Capacity:  6 Minute Walk    Row Name 12/20/19 1101 02/13/20 1624       6 Minute Walk  Phase Initial Discharge    Distance 810 feet 1176 feet    Distance % Change -- 45 %    Distance Feet Change -- 366 ft    Walk Time 6 minutes 6 minutes    # of Rest Breaks 2 0    MPH 1.5 2.22    METS 0.4 1.28    RPE 11 11    Perceived Dyspnea  1 2    VO2 Peak 1.43 4.51    Symptoms Yes (comment) No    Comments SOB, RPD = 1. Took 2 rest breaks: 1:06 seconds and 30 seconds due to SOB SOB RPD = 2. No breaks    Resting HR 58 bpm 70 bpm    Resting BP 122/62 120/62    Resting Oxygen Saturation  96 % --    Exercise Oxygen Saturation  during 6 min walk 92 % --    Max Ex. HR 71 bpm 90 bpm    Max Ex. BP 132/72 146/70    2 Minute Post BP 110/10 --           Psychological, QOL, Others - Outcomes: PHQ 2/9: Depression screen [facility] 2/9 02/17/2020 02/15/2020 12/20/2019 09/22/2019 08/07/2014  Decreased Interest 0 0 0 0 0  Down, Depressed, Hopeless 0 0 0 0 0  PHQ - 2 Score 0 0 0 0 0  Some recent data might be hidden    Quality of Life:  Quality of  Life - 02/13/20 1623      Quality of Life Scores   Health/Function Post 23 %    Socioeconomic Post 24.31 %    Psych/Spiritual Post 24.64 %    Family Post 25.2 %    GLOBAL Post 23.94 %           Personal Goals: Goals established at orientation with interventions provided to work toward goal.  Personal Goals and Risk Factors at Admission - 12/20/19 1542      Core Components/Risk Factors/Patient Goals on Admission    Weight Management Weight Maintenance;Yes    Intervention Weight Management: Develop a combined nutrition and exercise program designed to reach desired caloric intake, while maintaining appropriate intake of nutrient and fiber, sodium and fats, and appropriate energy expenditure required for the weight goal.;Weight Management: Provide education and appropriate resources to help participant work on and attain dietary goals.    Expected Outcomes Short Term: Continue to assess and modify interventions until short term weight is achieved;Long Term: Adherence to nutrition and physical activity/exercise program aimed toward attainment of established weight goal;Weight Maintenance: Understanding of the daily nutrition guidelines, which includes 25-35% calories from fat, 7% or less cal from saturated fats, less than $RemoveB'200mg'zkzshoPl$  cholesterol, less than 1.5gm of sodium, & 5 or more servings of fruits and vegetables daily;Understanding recommendations for meals to include 15-35% energy as protein, 25-35% energy from fat, 35-60% energy from carbohydrates, less than $RemoveB'200mg'kJTXaiyO$  of dietary cholesterol, 20-35 gm of total fiber daily;Understanding of distribution of calorie intake throughout the day with the consumption of 4-5 meals/snacks;Weight Gain: Understanding of general recommendations for a high calorie, high protein meal plan that promotes weight gain by distributing calorie intake throughout the day with the consumption for 4-5 meals, snacks, and/or supplements    Hypertension Yes    Intervention  Provide education on lifestyle modifcations including regular physical activity/exercise, weight management, moderate sodium restriction and increased consumption of fresh fruit, vegetables, and low fat dairy, alcohol moderation, and smoking cessation.;Monitor prescription use compliance.    Expected Outcomes  Short Term: Continued assessment and intervention until BP is < 140/42mm HG in hypertensive participants. < 130/80mm HG in hypertensive participants with diabetes, heart failure or chronic kidney disease.;Long Term: Maintenance of blood pressure at goal levels.    Lipids Yes    Intervention Provide education and support for participant on nutrition & aerobic/resistive exercise along with prescribed medications to achieve LDL '70mg'$ , HDL >$Remo'40mg'hcGrj$ .    Expected Outcomes Short Term: Participant states understanding of desired cholesterol values and is compliant with medications prescribed. Participant is following exercise prescription and nutrition guidelines.;Long Term: Cholesterol controlled with medications as prescribed, with individualized exercise RX and with personalized nutrition plan. Value goals: LDL < $Rem'70mg'RmTi$ , HDL > 40 mg.    Stress Yes    Intervention Offer individual and/or small group education and counseling on adjustment to heart disease, stress management and health-related lifestyle change. Teach and support self-help strategies.;Refer participants experiencing significant psychosocial distress to appropriate mental health specialists for further evaluation and treatment. When possible, include family members and significant others in education/counseling sessions.    Expected Outcomes Short Term: Participant demonstrates changes in health-related behavior, relaxation and other stress management skills, ability to obtain effective social support, and compliance with psychotropic medications if prescribed.;Long Term: Emotional wellbeing is indicated by absence of clinically significant  psychosocial distress or social isolation.            Personal Goals Discharge:  Goals and Risk Factor Review    Row Name 12/26/19 1457 01/20/20 1357 02/14/20 0830         Core Components/Risk Factors/Patient Goals Review   Personal Goals Review Weight Management/Obesity;Lipids;Hypertension;Stress Weight Management/Obesity;Lipids;Hypertension;Stress Weight Management/Obesity;Lipids;Hypertension;Stress     Review Ahmadou started exercise on 12/26/19. VSS. Patient did well with exercise Viyan has been doing well with exercise at cardiac rehab. Roddy's vital signs have been stable. Demarri ahs been able to increase his workloads without complaints of SOB. Vladimir has been doing well with exercise at cardiac rehab. Connar's vital signs have been stable. Keymani ahs been able to increase his workloads without complaints of SOB.     Expected Outcomes Abhiram will continue to participate in phase 2 exercise for exercise, nutrition and lifestyle modifications Bernarr will continue to participate in phase 2 exercise for exercise, nutrition and lifestyle modifications Okechukwu will continue to participate in phase 2 exercise for exercise, nutrition and lifestyle modifications. Dabney completes phase 2 cardiac rehab at the end of the week            Exercise Goals and Review:  Exercise Goals    Row Name 12/20/19 1301             Exercise Goals   Increase Physical Activity Yes       Intervention Provide advice, education, support and counseling about physical activity/exercise needs.;Develop an individualized exercise prescription for aerobic and resistive training based on initial evaluation findings, risk stratification, comorbidities and participant's personal goals.       Expected Outcomes Short Term: Attend rehab on a regular basis to increase amount of physical activity.;Long Term: Add in home exercise to make exercise part of routine and to increase amount of physical activity.;Long Term: Exercising  regularly at least 3-5 days a week.       Increase Strength and Stamina Yes       Intervention Provide advice, education, support and counseling about physical activity/exercise needs.;Develop an individualized exercise prescription for aerobic and resistive training based on initial evaluation findings, risk stratification, comorbidities and participant's personal goals.  Expected Outcomes Short Term: Increase workloads from initial exercise prescription for resistance, speed, and METs.;Short Term: Perform resistance training exercises routinely during rehab and add in resistance training at home;Long Term: Improve cardiorespiratory fitness, muscular endurance and strength as measured by increased METs and functional capacity (6MWT)       Able to understand and use rate of perceived exertion (RPE) scale Yes       Intervention Provide education and explanation on how to use RPE scale       Expected Outcomes Short Term: Able to use RPE daily in rehab to express subjective intensity level;Long Term:  Able to use RPE to guide intensity level when exercising independently       Knowledge and understanding of Target Heart Rate Range (THRR) Yes       Intervention Provide education and explanation of THRR including how the numbers were predicted and where they are located for reference       Expected Outcomes Short Term: Able to state/look up THRR;Short Term: Able to use daily as guideline for intensity in rehab;Long Term: Able to use THRR to govern intensity when exercising independently       Understanding of Exercise Prescription Yes       Intervention Provide education, explanation, and written materials on patient's individual exercise prescription       Expected Outcomes Short Term: Able to explain program exercise prescription;Long Term: Able to explain home exercise prescription to exercise independently              Exercise Goals Re-Evaluation:  Exercise Goals Re-Evaluation    Row Name  12/26/19 1040 01/13/20 1622 02/13/20 1100 02/21/20 1107       Exercise Goal Re-Evaluation   Exercise Goals Review Able to understand and use rate of perceived exertion (RPE) scale;Knowledge and understanding of Target Heart Rate Range (THRR);Understanding of Exercise Prescription Increase Physical Activity;Increase Strength and Stamina;Able to understand and use rate of perceived exertion (RPE) scale;Knowledge and understanding of Target Heart Rate Range (THRR);Able to check pulse independently;Understanding of Exercise Prescription Increase Physical Activity;Increase Strength and Stamina;Able to understand and use rate of perceived exertion (RPE) scale;Knowledge and understanding of Target Heart Rate Range (THRR);Able to check pulse independently;Understanding of Exercise Prescription Increase Physical Activity;Increase Strength and Stamina;Able to understand and use rate of perceived exertion (RPE) scale;Knowledge and understanding of Target Heart Rate Range (THRR);Able to check pulse independently;Understanding of Exercise Prescription    Comments Pt's first day of exercise. Pt was able to exercise for 30 minutes with minimal difficulty. Will continue to monitor. Reviewed home exercise Rx with patient today. Pt is currently walking at home 3-4x/week for 30 minutes. Pt verbalized understanding of HEP and was provided a copy. Pt making good progress. Pt voices his stamina has improved and he is able to do his walk without having to stop. He imrpoved 45% on his 6 minute walk test over basline. Pt graduated for the [place] program today. Pt made good progress and had an average of 3.1 METs. The patient will continues to walk at home and plans to join the Phase 3 maintenance program ofr exercise 2x/week.    Expected Outcomes Pt will continue to increase his strength and stamina. Pt will continue to walk at home 3-4x/week for 30 minutes. Will continue to monitor pt progress. Pt graduated form the CRP2 program today  and plans to continue to exercise by walking at home and joining the phase 3 maintenance program. The patient voices that his tolerance for walking  has impoved. The patient improved on his post 6 minute walk test by 45%(366 ft) over basline.           Nutrition & Weight - Outcomes:  Pre Biometrics - 12/20/19 0900      Pre Biometrics   Waist Circumference 39.5 inches    Hip Circumference 41.25 inches    Waist to Hip Ratio 0.96 %    Triceps Skinfold 16 mm    % Body Fat 29.2 %    Grip Strength 21 kg    Flexibility 9.75 in    Single Leg Stand 2.3 seconds   High Fall Risk          Post Biometrics - 02/15/20 1032       Post  Biometrics   Height 5' 4" (1.626 m)    Weight 74.3 kg    Waist Circumference 39 inches    Hip Circumference 41 inches    Waist to Hip Ratio 0.95 %    BMI (Calculated) 28.1    Triceps Skinfold 14 mm    % Body Fat 28.3 %    Grip Strength 22 kg    Flexibility 0 in   Low back tight today, could not reach   Single Leg Stand 6.62 seconds           Nutrition:  Nutrition Therapy & Goals - 01/20/20 1112      Nutrition Therapy   Diet Heart Healthy      Personal Nutrition Goals   Nutrition Goal Pt to build a healthy plate including vegetables, fruits, whole grains, and low-fat dairy products in a heart healthy meal plan.    Personal Goal #2 Pt to choose palatable, easy to prepare/prepackaged fruits,whole grains, and veggies      Intervention Plan   Intervention Prescribe, educate and counsel regarding individualized specific dietary modifications aiming towards targeted core components such as weight, hypertension, lipid management, diabetes, heart failure and other comorbidities.    Expected Outcomes Short Term Goal: Understand basic principles of dietary content, such as calories, fat, sodium, cholesterol and nutrients.           Nutrition Discharge:  Nutrition Assessments - 02/28/20 1214      MEDFICTS Scores   Pre Score --   did not return   Post  Score 56           Education Questionnaire Score:  Knowledge Questionnaire Score - 02/13/20 1623      Knowledge Questionnaire Score   Post Score 22/24           Goals reviewed with patient; copy given to patient.Pt graduated from cardiac rehab program today with completion of 23 exercise sessions in Phase II. Pt maintained good attendance and progressed nicely during his participation in rehab as evidenced by increased MET level.   Medication list reconciled. Repeat  PHQ score- 0 .  Pt has made significant lifestyle changes and should be commended for his success. Pt feels he has achieved his goals during cardiac rehab.   Pt plans to continue exercise in cardiac maintenance program and by walking. We are so proud of Susano's progress! Lynden increased his distance on his post exercise walk test by 366 feet. Ladarius reports feeling stronger since participating in the program. Barnet Pall, RN,BSN 03/07/2020 1:41 PM

## 2020-03-01 DIAGNOSIS — E785 Hyperlipidemia, unspecified: Secondary | ICD-10-CM | POA: Diagnosis not present

## 2020-03-01 DIAGNOSIS — Z125 Encounter for screening for malignant neoplasm of prostate: Secondary | ICD-10-CM | POA: Diagnosis not present

## 2020-03-09 ENCOUNTER — Telehealth: Payer: Self-pay | Admitting: Nurse Practitioner

## 2020-03-09 NOTE — Telephone Encounter (Signed)
Rescheduled appt per 1/6 sch msg - left message for patient with appt date and time

## 2020-03-12 ENCOUNTER — Other Ambulatory Visit: Payer: Medicare Other

## 2020-03-12 ENCOUNTER — Ambulatory Visit: Payer: Medicare Other | Admitting: Nurse Practitioner

## 2020-03-13 DIAGNOSIS — H353221 Exudative age-related macular degeneration, left eye, with active choroidal neovascularization: Secondary | ICD-10-CM | POA: Diagnosis not present

## 2020-03-13 DIAGNOSIS — H35373 Puckering of macula, bilateral: Secondary | ICD-10-CM | POA: Diagnosis not present

## 2020-03-13 DIAGNOSIS — H43821 Vitreomacular adhesion, right eye: Secondary | ICD-10-CM | POA: Diagnosis not present

## 2020-03-13 DIAGNOSIS — H43812 Vitreous degeneration, left eye: Secondary | ICD-10-CM | POA: Diagnosis not present

## 2020-03-13 DIAGNOSIS — H35412 Lattice degeneration of retina, left eye: Secondary | ICD-10-CM | POA: Diagnosis not present

## 2020-03-13 DIAGNOSIS — Z961 Presence of intraocular lens: Secondary | ICD-10-CM | POA: Diagnosis not present

## 2020-03-16 ENCOUNTER — Other Ambulatory Visit: Payer: Self-pay | Admitting: *Deleted

## 2020-03-16 ENCOUNTER — Ambulatory Visit: Payer: Medicare Other | Admitting: *Deleted

## 2020-03-16 NOTE — Patient Outreach (Signed)
[facility]) Care Management  03/16/2020  YACOB WILKERSON 04-13-1923 [phone]   Telephone Assessment-Unsuccessful  RN attempted outreach call today however unsuccessful. RN able to leave a HIPAA approved voice message requesting a call back.  Will rescheduled another outreach over the next week.  [name], RN Care Management Coordinator [place] Office [phone]

## 2020-03-20 DIAGNOSIS — R0989 Other specified symptoms and signs involving the circulatory and respiratory systems: Secondary | ICD-10-CM | POA: Diagnosis not present

## 2020-03-20 DIAGNOSIS — U071 COVID-19: Secondary | ICD-10-CM | POA: Diagnosis not present

## 2020-03-20 DIAGNOSIS — J189 Pneumonia, unspecified organism: Secondary | ICD-10-CM | POA: Diagnosis not present

## 2020-03-21 ENCOUNTER — Encounter: Payer: Self-pay | Admitting: *Deleted

## 2020-03-21 ENCOUNTER — Other Ambulatory Visit: Payer: Self-pay | Admitting: *Deleted

## 2020-03-21 NOTE — Patient Outreach (Signed)
[place] [place] [facility]) Care Management  03/21/2020  [name] Jun 10, 1923 [id number]   Telephone Assessment-Successful-Hypertension  RN spoke with the pt's spouse [name] who provided an update. Caregiver spouse indicates pt is doing much better however incidence since the last conversation last month is the pt has co-vid with recent X-Ray LLL pneumonia. Pt had a chronic cough visiting his primary provider and provided medication for his symptoms.  Spouse reports findings with O2 sats at 95% and blood pressures was good on the office visit however pt has not taken his blood pressure since that time. RN reiterate on the importance of daily monitoring especially since his newly developed illness. Stress the importance of contact his provider with any worsening symptoms to avoid hospitalization (verbalized an understanding.   Plan of care updated as pt remains on track however incorporated other goals to complete pt's plan of care for all new and existing goals/interventions. Will continue to encouraged adherence and document changes on the care plan. Will follow up next month and update pt's provider accordingly with pt's disposition with [facility] services. No inquires or request today as pt aware [facility] has social workers and pharmacy to assist in these area if needed.   Goals Addressed            This Visit's Progress   . [facility]-Disease Progression Prevented or Minimized Hypertension   On track    Follow up Date 04/20/2020 Timeframe:  Long-Range Goal Priority:  Medium Start Date:  03/21/2020                            Expected End Date:  06/29/2020                      Evidence-based guidance:   Tailor lifestyle advice to individual; review progress regularly; give frequent encouragement and respond positively to incremental successes.   Assess for and promote awareness of worsening disease or development of comorbidity.   Prepare patient for laboratory and diagnostic exams based on risk  and presentation.   Prepare patient for use of pharmacologic therapy that may include diuretic, beta-blocker, beta-blocker/thiazide combination, angiotensin-converting enzyme inhibitor, renin-angiotensin blocker or calcium-channel blocker.   Expect periodic adjustments to pharmacologic therapy; manage side effects.   Promote a healthy diet that includes primarily plant-based foods, such as fruits, vegetables, whole grains, beans and legumes, low-fat dairy and lean meats.    Consider moderate reduction in sodium intake by avoiding the addition of salt to prepared foods and limiting processed meats, canned soup, frozen meals and salty snacks.    Promote a regular, daily exercise goal of 150 minutes per week of moderate exercise based on tolerance, ability and patient choice; consider referral to physical therapist, community wellness and/or activity program.   Encourage the avoidance of no more than 2 hours per day of sedentary activity, such as recreational screen time.   Review sources of stress; explore current coping strategies and encourage use of mindfulness, yoga, meditation or exercise to manage stress.   Notes:  1/19-Discussed ongoing exercises and healthy habits to lower the risk for elevated blood pressures/stroke. Continue to educate on FAST for stroke prevention measures.    Shauna Hugh Eating   On track    Follow Up Date 04/20/2020 Timeframe:  Long-Range Goal Priority:  Medium Start Date:     02/10/2020  Expected End Date:    06/29/2020                  - drink 6 to 8 glasses of water each day - limit fast food meals to no more than 1 per week - manage portion size - set a realistic goal - switch to low-fat or skim milk - switch to sugar-free drinks    Why is this important?   When you are ready to manage your nutrition or weight, having a plan and setting goals will help.  Taking small steps to change how you eat and exercise is a good place to  start.    Notes:   03/21/2020-Pt continue to improve with his oral intake however will extend this goal to allow ongoing adherence for increase in his exercises for stability with strengthening and endurance.     Ladona Mow More About My Health   On track    Follow Up Date 04/20/2020 Timeframe:  Long-Range Goal Priority:  Medium Start Date:     03/21/2020                        Expected End Date:      06/29/2020                     - make a list of questions - ask questions - repeat what I heard to make sure I understand - speak up when I don't understand    Why is this important?    The best way to learn about your health and care is by talking to the doctor and nurse.   They will answer your questions and give you information in the way that you like best.    Notes:   1/19- Educate pt on his recent covid positive test along with his LLL pneumonia. Discussed his current treatment plan and the importance of following the recommendations. Continue to encourage healthy habits as he continue to manage his health related problems.     Linward Headland and Keep All Appointments   On track    Follow Up Date  04/20/2020 Timeframe:  Short-Term Goal Priority:  Medium Start Date:    03/21/2020                         Expected End Date:  04/30/2020                        - arrange a ride through an agency 1 week before appointment - ask family or friend for a ride - call to cancel if needed - keep a calendar with prescription refill dates - keep a calendar with appointment dates    Why is this important?    Part of staying healthy is seeing the doctor for follow-up care.   If you forget your appointments, there are some things you can do to stay on track.    Notes:  1/19-Discuss all pending follow up appointments and encouraged pt attendance for follow up X-Ray concerning his recent diagnosis of LLL pneumonia (Co-vid related symptoms).        [name], RN Care Management  Coordinator [place] Office [phone]

## 2020-03-23 ENCOUNTER — Telehealth: Payer: Self-pay | Admitting: Nurse Practitioner

## 2020-03-23 NOTE — Telephone Encounter (Signed)
Rescheduled appointment per 1/20 schedule message. Patient is aware of changes.

## 2020-04-02 ENCOUNTER — Other Ambulatory Visit: Payer: Medicare Other

## 2020-04-02 ENCOUNTER — Ambulatory Visit: Payer: Medicare Other | Admitting: Nurse Practitioner

## 2020-04-02 DIAGNOSIS — H60332 Swimmer's ear, left ear: Secondary | ICD-10-CM | POA: Diagnosis not present

## 2020-04-09 DIAGNOSIS — I2510 Atherosclerotic heart disease of native coronary artery without angina pectoris: Secondary | ICD-10-CM | POA: Diagnosis not present

## 2020-04-09 DIAGNOSIS — D696 Thrombocytopenia, unspecified: Secondary | ICD-10-CM | POA: Diagnosis not present

## 2020-04-09 DIAGNOSIS — R972 Elevated prostate specific antigen [PSA]: Secondary | ICD-10-CM | POA: Diagnosis not present

## 2020-04-09 DIAGNOSIS — M5136 Other intervertebral disc degeneration, lumbar region: Secondary | ICD-10-CM | POA: Diagnosis not present

## 2020-04-09 DIAGNOSIS — R2681 Unsteadiness on feet: Secondary | ICD-10-CM | POA: Diagnosis not present

## 2020-04-09 DIAGNOSIS — I10 Essential (primary) hypertension: Secondary | ICD-10-CM | POA: Diagnosis not present

## 2020-04-09 DIAGNOSIS — I480 Paroxysmal atrial fibrillation: Secondary | ICD-10-CM | POA: Diagnosis not present

## 2020-04-09 DIAGNOSIS — H9193 Unspecified hearing loss, bilateral: Secondary | ICD-10-CM | POA: Diagnosis not present

## 2020-04-09 DIAGNOSIS — F5104 Psychophysiologic insomnia: Secondary | ICD-10-CM | POA: Diagnosis not present

## 2020-04-09 DIAGNOSIS — Z0000 Encounter for general adult medical examination without abnormal findings: Secondary | ICD-10-CM | POA: Diagnosis not present

## 2020-04-09 DIAGNOSIS — E785 Hyperlipidemia, unspecified: Secondary | ICD-10-CM | POA: Diagnosis not present

## 2020-04-09 DIAGNOSIS — Z8673 Personal history of transient ischemic attack (TIA), and cerebral infarction without residual deficits: Secondary | ICD-10-CM | POA: Diagnosis not present

## 2020-04-16 ENCOUNTER — Other Ambulatory Visit: Payer: Self-pay

## 2020-04-16 ENCOUNTER — Encounter: Payer: Self-pay | Admitting: Nurse Practitioner

## 2020-04-16 ENCOUNTER — Inpatient Hospital Stay: Payer: Medicare Other | Attending: Nurse Practitioner

## 2020-04-16 ENCOUNTER — Inpatient Hospital Stay (HOSPITAL_BASED_OUTPATIENT_CLINIC_OR_DEPARTMENT_OTHER): Payer: Medicare Other | Admitting: Nurse Practitioner

## 2020-04-16 VITALS — BP 136/69 | HR 67 | Temp 97.6°F | Resp 16 | Ht 64.0 in | Wt 163.5 lb

## 2020-04-16 DIAGNOSIS — D693 Immune thrombocytopenic purpura: Secondary | ICD-10-CM

## 2020-04-16 DIAGNOSIS — Z8616 Personal history of COVID-19: Secondary | ICD-10-CM | POA: Insufficient documentation

## 2020-04-16 LAB — CBC WITH DIFFERENTIAL (CANCER CENTER ONLY)
Abs Immature Granulocytes: 0.08 10*3/uL — ABNORMAL HIGH (ref 0.00–0.07)
Basophils Absolute: 0.1 10*3/uL (ref 0.0–0.1)
Basophils Relative: 2 %
Eosinophils Absolute: 0.3 10*3/uL (ref 0.0–0.5)
Eosinophils Relative: 4 %
HCT: 36 % — ABNORMAL LOW (ref 39.0–52.0)
Hemoglobin: 11.7 g/dL — ABNORMAL LOW (ref 13.0–17.0)
Immature Granulocytes: 1 %
Lymphocytes Relative: 24 %
Lymphs Abs: 1.6 10*3/uL (ref 0.7–4.0)
MCH: 27.1 pg (ref 26.0–34.0)
MCHC: 32.5 g/dL (ref 30.0–36.0)
MCV: 83.5 fL (ref 80.0–100.0)
Monocytes Absolute: 1.5 10*3/uL — ABNORMAL HIGH (ref 0.1–1.0)
Monocytes Relative: 24 %
Neutro Abs: 2.9 10*3/uL (ref 1.7–7.7)
Neutrophils Relative %: 45 %
Platelet Count: 27 10*3/uL — ABNORMAL LOW (ref 150–400)
RBC: 4.31 MIL/uL (ref 4.22–5.81)
RDW: 17.2 % — ABNORMAL HIGH (ref 11.5–15.5)
WBC Count: 6.5 10*3/uL (ref 4.0–10.5)
nRBC: 0 % (ref 0.0–0.2)

## 2020-04-16 NOTE — Progress Notes (Addendum)
  [name] OFFICE PROGRESS NOTE   Diagnosis: ITP, history of hairy cell leukemia  INTERVAL HISTORY:   [name] returns for follow-up. He reports having Covid in January. He denies bleeding. He continues to note easy bruising. His wife is recovering from knee surgery and did not accompany him to today's visit.  Objective:  Vital signs in last 24 hours:  Blood pressure 136/69, pulse 67, temperature 97.6 F (36.4 C), temperature source Tympanic, resp. rate 16, height _0  (1.626 m), weight 163 lb 8 oz (74.2 kg), SpO2 98 %.    HEENT: 2 small ecchymoses right buccal mucosa.  Tiny ecchymosis right posterior palate Resp: Lungs clear bilaterally. Cardio: Regular rate and rhythm. GI: No hepatosplenomegaly. Vascular: Trace edema at the lower leg bilaterally.  Skin: Ecchymoses scattered over the dorsal aspect of both hands and forearms.   Lab Results:  Lab Results  Component Value Date   WBC 6.5 04/16/2020   HGB 11.7 (L) 04/16/2020   HCT 36.0 (L) 04/16/2020   MCV 83.5 04/16/2020   PLT 27 (L) 04/16/2020   NEUTROABS 2.9 04/16/2020    Imaging:  No results found.  Medications: I have reviewed the patient's current medications.  Assessment/Plan: 1. Thrombocytopenia ? Bone marrow biopsy 07/08/2016-no evidence of B-cell lymphoma, 40% cellular bone marrow with trilineage hematopoiesis, megakaryocytes present with normal morphology, normal cytogenetics, negative for BRAF mutation ? Flow cytometry 01/05/2009-no monoclonal B-cell or phenotypically abnormal T-cell population ? Prednisone starting 06/29/2019 ? Nplate 07/04/2019, 3/50/0938,HWEXHB 07/18/2019 ? Prednisone 20 mg daily beginning 07/25/2019 ? Platelets 11,000 on 08/15/2019, prednisone increased to 40 mg daily, Nplate resume 09/15/9676 ? Prednisone decreased to 30 mg daily 08/25/2019, Nplate continued ? Prednisone continued at 30 mg daily 09/13/2019, weekly Nplate continued ? Prednisone taper to 20 mg daily 09/23/2019,  weekly Nplate continued ? Prednisone taper to 10 mg daily 09/30/2019, weekly Nplate continued ? Prednisone taper to 5 mg daily 10/21/2019, weekly Nplate continued ? Prednisone taper to 5 mg every other day for 5 doses then stop 11/11/2019, weekly Nplate continued ? Nplate last given 9/38/1017 2. Hairy cell leukemia 1982, status post splenectomy 3. Coronary artery disease 4. Aortic stenosis 5. 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 Macular degeneration 7. Hearing loss 8. Basal cell carcinoma left cheek 05/24/2019 9. Left upper lobe nodule, faint FDG activity on PET at [facility] 05/31/2013 10. Status post TAVR procedure 09/06/2019 11. Admission with Pseudomonas urosepsis 09/16/2019 12. Acute left MCA distribution CVA 09/16/2019   Disposition: [name] has progressive thrombocytopenia.  Aspirin will be placed on hold.  He will contact the office with bleeding.  He will return for a CBC and follow-up visit on 04/25/2020.   Patient seen with Dr. Benay Spice.    [name] ANP/GNP-BC   04/16/2020  10:36 AM   This was a shared visit with [name].  [name] was interviewed and examined.  The platelet count is lower.  He will hold aspirin.  We will resume treatment for ITP if the platelet count remains less than 30,000.  I was present for greater than 50% of today's visit.  I performed medical decision making.  Julieanne Manson, MD

## 2020-04-17 ENCOUNTER — Encounter: Payer: Self-pay | Admitting: Oncology

## 2020-04-17 ENCOUNTER — Other Ambulatory Visit: Payer: Self-pay | Admitting: *Deleted

## 2020-04-17 ENCOUNTER — Telehealth: Payer: Self-pay | Admitting: Nurse Practitioner

## 2020-04-17 DIAGNOSIS — H353221 Exudative age-related macular degeneration, left eye, with active choroidal neovascularization: Secondary | ICD-10-CM | POA: Diagnosis not present

## 2020-04-17 NOTE — Progress Notes (Signed)
Added Maxidex suspension per patient's MyChart message

## 2020-04-17 NOTE — Telephone Encounter (Signed)
Scheduled appointments per 2/14 los. Spoke to patient who is aware of appointments date and times.

## 2020-04-20 ENCOUNTER — Other Ambulatory Visit: Payer: Self-pay | Admitting: *Deleted

## 2020-04-20 NOTE — Patient Outreach (Signed)
[facility]) Care Management  04/20/2020  [name] 06-Apr-1923 [id number]  Telephone Assessment-Successful-HTN  RN spoke with pt today and inquired on his ongoing management of care. Pt reports he continue to do well however at times he feels he just can't catch his breath. His provider is aware and pt's sats are normal along with a recent x-ray no additional signs of pneumonia. Pt reports finishing his PT rehab and wishes to go through rehabilitation once again when his spouse has recovered from her illness. Spoke very highly of the program aft her heart surgery. This is the the limited breathing issues started not with his recent covid symptoms. Other issues involved his platelet count but this is slowly improved with much monitoring from his provider's office.   Plan of care review, discussed and updates noted accordingly on pt's ongoing progress with managing his ongoing HTN. Pt doing well and has all the tools needed for his ongoing management of care. Readings remains normal as reported 732 systolically from pt can remember with no changes in his medications and positive remarks from his provider.  Will follow up next month with pt's ongoing management of care and continue to encouraged pt on his adherence with the plan of care in place. No other inquires or request at this time.    Goals Addressed            This Visit's Progress   . [facility]-Disease Progression Prevented or Minimized Hypertension   On track    Follow up Date 05/18/2020 Timeframe:  Long-Range Goal Priority:  Medium Start Date:  03/21/2020                            Expected End Date:  06/29/2020                      Evidence-based guidance:   Tailor lifestyle advice to individual; review progress regularly; give frequent encouragement and respond positively to incremental successes.   Assess for and promote awareness of worsening disease or development of comorbidity.   Prepare patient for  laboratory and diagnostic exams based on risk and presentation.   Prepare patient for use of pharmacologic therapy that may include diuretic, beta-blocker, beta-blocker/thiazide combination, angiotensin-converting enzyme inhibitor, renin-angiotensin blocker or calcium-channel blocker.   Expect periodic adjustments to pharmacologic therapy; manage side effects.   Promote a healthy diet that includes primarily plant-based foods, such as fruits, vegetables, whole grains, beans and legumes, low-fat dairy and lean meats.    Consider moderate reduction in sodium intake by avoiding the addition of salt to prepared foods and limiting processed meats, canned soup, frozen meals and salty snacks.    Promote a regular, daily exercise goal of 150 minutes per week of moderate exercise based on tolerance, ability and patient choice; consider referral to physical therapist, community wellness and/or activity program.   Encourage the avoidance of no more than 2 hours per day of sedentary activity, such as recreational screen time.   Review sources of stress; explore current coping strategies and encourage use of mindfulness, yoga, meditation or exercise to manage stress.   Notes:  1/19-Discussed ongoing exercises and healthy habits to lower the risk for elevated blood pressures/stroke. Continue to educate on FAST for stroke prevention measures.    Shauna Hugh Eating   On track    Follow Up Date 05/18/2020 Timeframe:  Long-Range Goal Priority:  Medium Start Date:  02/10/2020                        Expected End Date:    06/29/2020                  - drink 6 to 8 glasses of water each day - limit fast food meals to no more than 1 per week - manage portion size - set a realistic goal - switch to low-fat or skim milk - switch to sugar-free drinks    Why is this important?   When you are ready to manage your nutrition or weight, having a plan and setting goals will help.  Taking small steps to change  how you eat and exercise is a good place to start.    Notes:   03/21/2020-Pt continue to improve with his oral intake however will extend this goal to allow ongoing adherence for increase in his exercises for stability with strengthening and endurance.     Ladona Mow More About My Health   On track    Follow Up Date 05/18/2020 Timeframe:  Long-Range Goal Priority:  Medium Start Date:     03/21/2020                        Expected End Date:      06/29/2020                     - make a list of questions - ask questions - repeat what I heard to make sure I understand - speak up when I don't understand    Why is this important?    The best way to learn about your health and care is by talking to the doctor and nurse.   They will answer your questions and give you information in the way that you like best.    Notes:   1/19- Educate pt on his recent covid positive test along with his LLL pneumonia. Discussed his current treatment plan and the importance of following the recommendations. Continue to encourage healthy habits as he continue to manage his health related problems.     Linward Headland and Keep All Appointments   On track    Follow Up Date  05/18/2020 Timeframe:  Short-Term Goal Priority:  Medium Start Date:    03/21/2020                         Expected End Date:  05/31/2020                        - arrange a ride through an agency 1 week before appointment - ask family or friend for a ride - call to cancel if needed - keep a calendar with prescription refill dates - keep a calendar with appointment dates    Why is this important?    Part of staying healthy is seeing the doctor for follow-up care.   If you forget your appointments, there are some things you can do to stay on track.    Notes:  2/18-Reports LLL pneumonia has resolved on the last X-ray. Verified pt continue to attend all appointments with sufficient transportation. 1/19-Discuss all pending follow up  appointments and encouraged pt attendance for follow up X-Ray concerning his recent diagnosis of LLL pneumonia (Co-vid related symptoms).        [name], RN  Care Management Coordinator [place] Office [phone]

## 2020-04-25 ENCOUNTER — Inpatient Hospital Stay (HOSPITAL_BASED_OUTPATIENT_CLINIC_OR_DEPARTMENT_OTHER): Payer: Medicare Other | Admitting: Nurse Practitioner

## 2020-04-25 ENCOUNTER — Other Ambulatory Visit: Payer: Self-pay

## 2020-04-25 ENCOUNTER — Encounter: Payer: Self-pay | Admitting: Nurse Practitioner

## 2020-04-25 ENCOUNTER — Inpatient Hospital Stay: Payer: Medicare Other

## 2020-04-25 VITALS — BP 128/70 | HR 65 | Temp 97.9°F | Resp 16 | Ht 64.0 in | Wt 162.1 lb

## 2020-04-25 DIAGNOSIS — Z8616 Personal history of COVID-19: Secondary | ICD-10-CM | POA: Diagnosis not present

## 2020-04-25 DIAGNOSIS — D693 Immune thrombocytopenic purpura: Secondary | ICD-10-CM

## 2020-04-25 LAB — CBC WITH DIFFERENTIAL (CANCER CENTER ONLY)
Abs Immature Granulocytes: 0.06 10*3/uL (ref 0.00–0.07)
Basophils Absolute: 0.1 10*3/uL (ref 0.0–0.1)
Basophils Relative: 2 %
Eosinophils Absolute: 0.2 10*3/uL (ref 0.0–0.5)
Eosinophils Relative: 3 %
HCT: 33.9 % — ABNORMAL LOW (ref 39.0–52.0)
Hemoglobin: 11 g/dL — ABNORMAL LOW (ref 13.0–17.0)
Immature Granulocytes: 1 %
Lymphocytes Relative: 20 %
Lymphs Abs: 1.4 10*3/uL (ref 0.7–4.0)
MCH: 26.8 pg (ref 26.0–34.0)
MCHC: 32.4 g/dL (ref 30.0–36.0)
MCV: 82.5 fL (ref 80.0–100.0)
Monocytes Absolute: 1.6 10*3/uL — ABNORMAL HIGH (ref 0.1–1.0)
Monocytes Relative: 23 %
Neutro Abs: 3.6 10*3/uL (ref 1.7–7.7)
Neutrophils Relative %: 51 %
Platelet Count: 43 10*3/uL — ABNORMAL LOW (ref 150–400)
RBC: 4.11 MIL/uL — ABNORMAL LOW (ref 4.22–5.81)
RDW: 17.2 % — ABNORMAL HIGH (ref 11.5–15.5)
WBC Count: 7 10*3/uL (ref 4.0–10.5)
nRBC: 0 % (ref 0.0–0.2)

## 2020-04-25 MED ORDER — PANTOPRAZOLE SODIUM 40 MG PO TBEC
DELAYED_RELEASE_TABLET | ORAL | 3 refills | Status: DC
Start: 2020-04-25 — End: 2020-05-01

## 2020-04-25 NOTE — Progress Notes (Signed)
  [place] OFFICE PROGRESS NOTE   Diagnosis: ITP, history of hairy cell leukemia  INTERVAL HISTORY:   [name] returns as scheduled.  He denies bleeding.  He has stable dyspnea on exertion.  Objective:  Vital signs in last 24 hours:  Blood pressure 128/70, pulse 65, temperature 97.9 F (36.6 C), temperature source Tympanic, resp. rate 16, height $RemoveBe'5\' 4"'DuLENGBaz$  (1.626 m), weight 162 lb 1.6 oz (73.5 kg), SpO2 97 %.    HEENT: No bleeding within the oral cavity. Resp: Lungs clear bilaterally. Cardio: Regular rate and rhythm. GI: No hepatosplenomegaly. Vascular: Trace edema lower leg bilaterally.  Skin: Ecchymosis bilateral forearm right greater than left.   Lab Results:  Lab Results  Component Value Date   WBC 7.0 04/25/2020   HGB 11.0 (L) 04/25/2020   HCT 33.9 (L) 04/25/2020   MCV 82.5 04/25/2020   PLT 43 (L) 04/25/2020   NEUTROABS 3.6 04/25/2020    Imaging:  No results found.  Medications: I have reviewed the patient's current medications.  Assessment/Plan: 1. Thrombocytopenia ? Bone marrow biopsy 07/08/2016-no evidence of B-cell lymphoma, 40% cellular bone marrow with trilineage hematopoiesis, megakaryocytes present with normal morphology, normal cytogenetics, negative for BRAF mutation ? Flow cytometry 01/05/2009-no monoclonal B-cell or phenotypically abnormal T-cell population ? Prednisone starting 06/29/2019 ? Nplate 07/04/2019, 1/61/0960,AVWUJW 07/18/2019 ? Prednisone 20 mg daily beginning 07/25/2019 ? Platelets 11,000 on 08/15/2019, prednisone increased to 40 mg daily, Nplate resume 03/21/1476 ? Prednisone decreased to 30 mg daily 08/25/2019, Nplate continued ? Prednisone continued at 30 mg daily 09/13/2019, weekly Nplate continued ? Prednisone taper to 20 mg daily 09/23/2019, weekly Nplate continued ? Prednisone taper to 10 mg daily 09/30/2019, weekly Nplate continued ? Prednisone taper to 5 mg daily 10/21/2019, weekly Nplate continued ? Prednisone taper to 5  mg every other day for 5 doses then stop 11/11/2019, weekly Nplate continued ? Nplate last given 2/95/6213 2. Hairy cell leukemia 1982, status post splenectomy 3. Coronary artery disease 4. Aortic stenosis 5. 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 Macular degeneration 7. Hearing loss 8. Basal cell carcinoma left cheek 05/24/2019 9. Left upper lobe nodule, faint FDG activity on PET at [facility] 05/31/2013 10. Status post TAVR procedure 09/06/2019 11. Admission with Pseudomonas urosepsis 09/16/2019 12. Acute left MCA distribution CVA 09/16/2019 13. Covid January 2022   Disposition: [name] appears stable.  The platelet count is higher.  He will resume aspirin 81 mg daily.  Follow-up CBC in 2 weeks.  Next office visit in 4 weeks.  He will contact the office with bleeding.  Patient seen with Dr. Benay Spice.  [name] ANP/GNP-BC   04/25/2020  11:14 AM This was a shared visit with [name].  [name] will resume aspirin therapy.  The platelet count is higher today.  He remains off of specific therapy for ITP.  I was present for greater than 50% of today's visit.  I performed medical decision making.  Julieanne Manson, MD

## 2020-05-01 ENCOUNTER — Other Ambulatory Visit: Payer: Self-pay | Admitting: *Deleted

## 2020-05-01 DIAGNOSIS — Z952 Presence of prosthetic heart valve: Secondary | ICD-10-CM

## 2020-05-01 MED ORDER — PANTOPRAZOLE SODIUM 40 MG PO TBEC: 40.0000 mg | DELAYED_RELEASE_TABLET | Freq: Every day | ORAL | 3 refills | Status: DC

## 2020-05-09 ENCOUNTER — Other Ambulatory Visit: Payer: Self-pay

## 2020-05-09 ENCOUNTER — Telehealth: Payer: Self-pay

## 2020-05-09 ENCOUNTER — Inpatient Hospital Stay: Payer: Medicare Other | Attending: Oncology

## 2020-05-09 DIAGNOSIS — D693 Immune thrombocytopenic purpura: Secondary | ICD-10-CM | POA: Diagnosis not present

## 2020-05-09 LAB — CBC WITH DIFFERENTIAL (CANCER CENTER ONLY)
Abs Immature Granulocytes: 0.05 10*3/uL (ref 0.00–0.07)
Basophils Absolute: 0.1 10*3/uL (ref 0.0–0.1)
Basophils Relative: 2 %
Eosinophils Absolute: 0.2 10*3/uL (ref 0.0–0.5)
Eosinophils Relative: 3 %
HCT: 36.2 % — ABNORMAL LOW (ref 39.0–52.0)
Hemoglobin: 11.7 g/dL — ABNORMAL LOW (ref 13.0–17.0)
Immature Granulocytes: 1 %
Lymphocytes Relative: 23 %
Lymphs Abs: 1.8 10*3/uL (ref 0.7–4.0)
MCH: 26.8 pg (ref 26.0–34.0)
MCHC: 32.3 g/dL (ref 30.0–36.0)
MCV: 83 fL (ref 80.0–100.0)
Monocytes Absolute: 1.6 10*3/uL — ABNORMAL HIGH (ref 0.1–1.0)
Monocytes Relative: 21 %
Neutro Abs: 3.9 10*3/uL (ref 1.7–7.7)
Neutrophils Relative %: 50 %
Platelet Count: 55 10*3/uL — ABNORMAL LOW (ref 150–400)
RBC: 4.36 MIL/uL (ref 4.22–5.81)
RDW: 17.2 % — ABNORMAL HIGH (ref 11.5–15.5)
WBC Count: 7.6 10*3/uL (ref 4.0–10.5)
nRBC: 0 % (ref 0.0–0.2)

## 2020-05-09 NOTE — Telephone Encounter (Signed)
-----   Message from Owens Shark, NP sent at 05/09/2020  3:42 PM EST ----- Please let him know the platelet count is higher.  Follow-up as scheduled.  Call with bleeding.

## 2020-05-09 NOTE — Telephone Encounter (Signed)
Spoke with patient made aware of most recent platelet count f/u as scheduled and call for any bleeding

## 2020-05-12 NOTE — Progress Notes (Signed)
Cardiology Office Note:    Date:  05/14/2020   ID:  [name], DOB 05/07/1923, MRN [id number]  PCP:  Paterson, Daniel, MD  Cardiologist:  Henry W Smith III, MD   Referring MD: Paterson, Daniel, MD   Chief Complaint  Patient presents with  . Congestive Heart Failure  . Cardiac Valve Problem    History of Present Illness:    [name] is a 87 y.o. male with a hx of idiopathic thrombocytopenia (Rx'd with Nplate and steroids),bifasicular block (RBBB, LAFB), anemia, anxiety, CAD, colon cancer,HLD, GERD, hairy cell leukemia,PAFfound on monitor,HTN and severe aortic stenosiss/p TAVR (09/06/19), high-grade AV block that resolved without pacemaker, CVA,followed by a readmission for urosepsis/bacteremia. Now in cardiac rehab.  [name] is now a year and a half post transcatheter aortic valve replacement by Dr. Chris Mcalhany in July 2021.  He had a rocky postprocedural course developing urinary retention, sepsis, CVA, and eventually high-grade AV block that resolved without requiring requiring permanent pacemaker.  This all happened over a period of 8 to 10 weeks post procedure.  He has been stable over the past 6 months.  He says he just does not feel right.  He has some unsteadiness when he stands and with gait.  He feels weak and short of breath when he walks up stairs and up inclines.  He denies angina.  He has not had syncope.  He denies orthopnea.  There is no lower extremity swelling.  He denies chills, fever, orthopnea, and PND.  He completed cardiac rehab.  He is back walking outdoors.  Past Medical History:  Diagnosis Date  . Acute appendicitis   . Anemia   . Anxiety   . Arthritis    "back" (03/14/2014)  . Blood dyscrasia    hairy cell leukemia  . CAD (coronary artery disease)    a. 03/14/14  s/p overlapping DES x2 to mid-distal RCA.  . 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 Colon cancer (HCC) 1984  . Compression fracture of lumbar spine,  non-traumatic (HCC)   . DJD (degenerative joint disease) of lumbar spine   . Dyslipidemia   . Dyspnea   . Elevated PSA   . GERD (gastroesophageal reflux disease)   . Glaucoma   . Hairy cell leukemia (HCC) dx'd 1980  . Heart murmur   . History of blood transfusion    "several; related to hairy cell leukemia & tx "  . History of stomach ulcers 1968  . Hypertension   . Leukemia, hairy cell (HCC)   . Malnutrition (HCC)   . Osteoarthritis   . Osteoporosis   . Pneumonia   . 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 McAlhany and Bartle.   . Severe aortic stenosis    s/p tavr  . Skin cancer of face     Past Surgical History:  Procedure Laterality Date  . APPENDECTOMY  10/2007  . BUBBLE STUDY  11/02/2019   Procedure: BUBBLE STUDY;  Surgeon: Acharya, Gayatri A, MD;  Location: [facility];  Service: Cardiology;;  . CATARACT EXTRACTION, BILATERAL Bilateral 02/2008  . COLON SURGERY  1984   "sigmoid; open"  . CORONARY ANGIOPLASTY WITH STENT PLACEMENT  03/14/2014   "2"  . EYE SURGERY Bilateral    cataract  . FRACTURE SURGERY    . LEFT HEART CATHETERIZATION WITH CORONARY ANGIOGRAM N/A 03/14/2014   Procedure: LEFT HEART CATHETERIZATION WITH   CORONARY ANGIOGRAM;  Surgeon: Peter M Martinique, MD;  Location: [facility] CATH LAB;  Service: Cardiovascular;  Laterality: N/A;  . LIPOMA EXCISION Right 07/2011   liposarcoma resection; "back"  . MOHS SURGERY Left 02/2011  . MOHS SURGERY  X 3   "all on my face"  . MOLE REMOVAL Left 1985   cheek  . ORIF ANKLE FRACTURE Left 2000  . RIGHT/LEFT HEART CATH AND CORONARY ANGIOGRAPHY N/A 07/26/2019   Procedure: RIGHT/LEFT HEART CATH AND CORONARY ANGIOGRAPHY;  Surgeon: Belva Crome, MD;  Location: [facility];  Service: Cardiovascular;  Laterality: N/A;  . SHOULDER SURGERY  04/2004  . SPLENECTOMY  1992  . TEE WITHOUT CARDIOVERSION N/A 09/06/2019   Procedure: TRANSESOPHAGEAL  ECHOCARDIOGRAM (TEE);  Surgeon: Burnell Blanks, MD;  Location: [place] [facility];  Service: Open Heart Surgery;  Laterality: N/A;  . TEE WITHOUT CARDIOVERSION N/A 11/02/2019   Procedure: TRANSESOPHAGEAL ECHOCARDIOGRAM (TEE);  Surgeon: Elouise Munroe, MD;  Location: [facility];  Service: Cardiology;  Laterality: N/A;  . TONSILLECTOMY AND ADENOIDECTOMY  1939  . TRANSCATHETER AORTIC VALVE REPLACEMENT, TRANSFEMORAL N/A 09/06/2019   Procedure: TRANSCATHETER AORTIC VALVE REPLACEMENT, TRANSFEMORAL;  Surgeon: Burnell Blanks, MD;  Location: [facility];  Service: Open Heart Surgery;  Laterality: N/A;    Current Medications: Current Meds  Medication Sig  . acetaminophen (TYLENOL) 500 MG tablet Take 1,000 mg by mouth every 8 (eight) hours as needed for moderate pain.  Marland Kitchen amoxicillin (AMOXIL) 500 MG tablet Take 4 capsules (2,000 mg) one hour prior to all dental visits.  Marland Kitchen aspirin EC 81 MG EC tablet Take 1 tablet (81 mg total) by mouth daily. Swallow whole.  Marland Kitchen atorvastatin (LIPITOR) 20 MG tablet TAKE 1 TABLET BY MOUTH DAILY AT 6 PM  . Cholecalciferol (VITAMIN D3) 50 MCG (2000 UT) TABS Take 2,000 Units by mouth every evening.  Marland Kitchen desonide (DESOWEN) 0.05 % cream Apply 1 application topically 2 (two) times daily as needed (rash/irritation.). Only takes as needed  . dexamethasone (DECADRON) 0.1 % ophthalmic suspension Place 1 drop into both eyes 2 (two) times daily.  . finasteride (PROSCAR) 5 MG tablet Take 5 mg by mouth daily.  . fluocinonide (LIDEX) 0.05 % external solution Apply 1 application topically 2 (two) times daily as needed (apply to ears).   . latanoprost (XALATAN) 0.005 % ophthalmic solution Place 1 drop into both eyes at bedtime.   . metoprolol tartrate (LOPRESSOR) 25 MG tablet Take 0.5 tablets (12.5 mg total) by mouth daily.  . Multiple Vitamins-Minerals (PRESERVISION AREDS 2 PO) Take 1 capsule by mouth 2 (two) times daily.   . mupirocin ointment (BACTROBAN) 2 % Apply  to affected area  . nitroGLYCERIN (NITROSTAT) 0.4 MG SL tablet Place 1 tablet (0.4 mg total) under the tongue every 5 (five) minutes as needed for chest pain. Please make yearly appt with Dr. Tamala Julian for September. 1st attempt  . ofloxacin (FLOXIN) 0.3 % OTIC solution SMARTSIG:Left Ear  . pantoprazole (PROTONIX) 40 MG tablet Take 1 tablet (40 mg total) by mouth daily.  . ranibizumab (LUCENTIS) 0.5 MG/0.05ML SOLN 0.5 mg by Intravitreal route every 6 (six) weeks. Left Eye ONLY  . saccharomyces boulardii (FLORASTOR) 250 MG capsule 1 capsule BID x 1 mo, then daily  . sertraline (ZOLOFT) 50 MG tablet Take 50 mg by mouth every evening.   . tamsulosin (FLOMAX) 0.4 MG CAPS capsule Take 0.8 mg by mouth daily after supper.   . timolol (BETIMOL) 0.5 % ophthalmic solution Place  1 drop into the right eye daily.   . [DISCONTINUED] furosemide (LASIX) 40 MG tablet Take 0.5 tablets (20 mg total) by mouth daily.  . [DISCONTINUED] potassium chloride (KLOR-CON) 10 MEQ tablet Take 1 tablet (10 mEq total) by mouth daily.     Allergies:   Antazoline; Antihistamines, chlorpheniramine-type; and Sulfa antibiotics   Social History   Socioeconomic History  . Marital status: Married    Spouse name: Not on file  . Number of children: Not on file  . Years of education: 103  . Highest education level: Bachelor's degree (e.g., BA, AB, BS)  Occupational History  . Occupation: Retired  Tobacco Use  . Smoking status: Former Smoker    Packs/day: 0.25    Years: 1.00    Pack years: 0.25    Types: Cigarettes, Cigars  . Smokeless tobacco: Never Used  . Tobacco comment: occasional social smoker during college.  Vaping Use  . Vaping Use: Never used  Substance and Sexual Activity  . Alcohol use: Yes    Alcohol/week: 9.0 standard drinks    Types: 2 Glasses of wine, 2 Shots of liquor, 5 Standard drinks or equivalent per week    Comment: socially  . Drug use: No  . Sexual activity: Not Currently  Other Topics Concern  .  Not on file  Social History Narrative  . Not on file   Social Determinants of Health   Financial Resource Strain: Not on file  Food Insecurity: No Food Insecurity  . Worried About Charity fundraiser in the Last Year: Never true  . Ran Out of Food in the Last Year: Never true  Transportation Needs: No Transportation Needs  . Lack of Transportation (Medical): No  . Lack of Transportation (Non-Medical): No  Physical Activity: Not on file  Stress: Not on file  Social Connections: Not on file     Family History: The patient's family history includes Congestive Heart Failure (age of onset: 73) in his father; Diabetes Mellitus II in his brother; Heart Problems in his brother; Prostate cancer in his brother; Stroke in his mother.  ROS:   Please see the history of present illness.    Occasionally notices that he can not find words as he would like.  All other systems reviewed and are negative.  EKGs/Labs/Other Studies Reviewed:    The following studies were reviewed today:  2D Doppler echocardiogram 01/18/2020: IMPRESSIONS  1. Left ventricular ejection fraction, by estimation, is 60 to 65%. The  left ventricle has normal function. The left ventricle has no regional  wall motion abnormalities. There is mild left ventricular hypertrophy.  Left ventricular diastolic parameters  are consistent with Grade I diastolic dysfunction (impaired relaxation).  Elevated left ventricular end-diastolic pressure. The E/e' is 18.  2. Right ventricular systolic function is normal. The right ventricular  size is normal. There is normal pulmonary artery systolic pressure. The  estimated right ventricular systolic pressure is 50.2 mmHg.  3. The mitral valve is abnormal. Trivial mitral valve regurgitation.  4. The aortic valve has been repaired/replaced. Aortic valve  regurgitation is trivial - no paravalvular leak. There is a 26 mm Edwards  Sapien prosthetic (TAVR) valve present in the aortic  position. Procedure  Date: 09/06/2019. Aortic valve mean gradient  measures 14.0 mmHg. Aortic valve Vmax measures 2.18 m/s. DI is 0.51.  5. The inferior vena cava is dilated in size with >50% respiratory  variability, suggesting right atrial pressure of 8 mmHg.  6. Aortic dilatation noted. There is  borderline dilatation of the aortic  root, measuring 38 mm.   Comparison(s): Changes from prior study are noted. 10/19/2019: LVEF 60-65%,  mean TAVR gradient 23 mmHg.   EKG:  EKG not repeated  Recent Labs: 09/02/2019: B Natriuretic Peptide 296.9 09/18/2019: ALT 21 09/21/2019: Magnesium 1.9 10/31/2019: BUN 23; Creatinine, Ser 1.21; Potassium 4.3; Sodium 136 05/09/2020: Hemoglobin 11.7; Platelet Count 55  Recent Lipid Panel    Component Value Date/Time   CHOL 96 09/17/2019 0752   TRIG 115 09/17/2019 0752   HDL 30 (L) 09/17/2019 0752   CHOLHDL 3.2 09/17/2019 0752   VLDL 23 09/17/2019 0752   LDLCALC 43 09/17/2019 0752    Physical Exam:    VS:  BP (!) 142/60   Pulse (!) 52   Ht 5' 4" (1.626 m)   Wt 160 lb (72.6 kg)   SpO2 97%   BMI 27.46 kg/m     Wt Readings from Last 3 Encounters:  05/14/20 160 lb (72.6 kg)  04/25/20 162 lb 1.6 oz (73.5 kg)  04/16/20 163 lb 8 oz (74.2 kg)     GEN: Elderly but not frail.. No acute distress HEENT: Normal NECK: No JVD. LYMPHATICS: No lymphadenopathy CARDIAC: 1-2 over 6 right upper sternal and left mid sternal systolic murmur.  There is no diastolic murmur. RRR no gallop, or edema. VASCULAR:  Normal Pulses. No bruits. RESPIRATORY:  Clear to auscultation without rales, wheezing or rhonchi  ABDOMEN: Soft, non-tender, non-distended, No pulsatile mass, MUSCULOSKELETAL: No deformity  SKIN: Warm and dry NEUROLOGIC:  Alert and oriented x 3 PSYCHIATRIC:  Normal affect   ASSESSMENT:    1. S/P TAVR (transcatheter aortic valve replacement)   2. Essential hypertension   3. Chronic diastolic CHF (congestive heart failure) (HCC)   4. PAF (paroxysmal  atrial fibrillation) (HCC)   5. Dyslipidemia   6. Anemia with low platelet count (HCC)   7. Conduction disorder, unspecified    PLAN:    In order of problems listed above:  1. Clinically, the TAVR valve is functioning normally. 2. The blood pressure is doing well and is currently 142/60.  From 93 this may be relatively low or perhaps it gets low after he has been walking for a while.  I wonder if diuretic therapy is really needed.  This could be causing some exertional intolerance.  He has only on 20 mg daily. 3. No evidence of volume overload on exam.  Decrease furosemide to 20 mg, Monday, Wednesday, and Friday.  Take K. Dur only on the days that furosemide is taken. 4. No recurrences of atrial fibrillation. 5. We did not discuss lipids. 6. Most recent hemoglobin was 11.  Platelet count 43,000 on February 2022. 7. Has not required permanent pacemaker therapy post TAVR.  The overall plan is to wean and discontinue Lasix if he does not develop fluid retention.  Potassium will also be decreased and potentially discontinued.  With normal LV function and no evidence of fluid accumulation, perhaps been off the diuretic will give a little more energy.  He will see me back in 1 month.  Be met will be done at that time.   Medication Adjustments/Labs and Tests Ordered: Current medicines are reviewed at length with the patient today.  Concerns regarding medicines are outlined above.  No orders of the defined types were placed in this encounter.  Meds ordered this encounter  Medications  . furosemide (LASIX) 40 MG tablet    Sig: Take 0.5 tablets (20 mg total) by mouth every   Monday, Wednesday, and Friday.    Dispense:  20 tablet    Refill:  1    Dose change.  Place on file.  Pt has some at home.  . potassium chloride (KLOR-CON) 10 MEQ tablet    Sig: Take 1 tablet (10 mEq total) by mouth every Monday, Wednesday, and Friday.    Dispense:  36 tablet    Refill:  1    Dose change.  Place on file.   Pt has some at home.    Patient Instructions  Medication Instructions:  1) DECREASE Furosemide and Potassium to just Monday, Wednesday and Friday.  After two weeks, if you are feeling ok, you may discontinue both medications.  Let us know if you start to develop increased shortness of breath, swelling or weight gain.  *If you need a refill on your cardiac medications before your next appointment, please call your pharmacy*   Lab Work: None If you have labs (blood work) drawn today and your tests are completely normal, you will receive your results only by: . MyChart Message (if you have MyChart) OR . A paper copy in the mail If you have any lab test that is abnormal or we need to change your treatment, we will call you to review the results.   Testing/Procedures: None   Follow-Up: At [facility], you and your health needs are our priority.  As part of our continuing mission to provide you with exceptional heart care, we have created designated Provider Care Teams.  These Care Teams include your primary Cardiologist (physician) and Advanced Practice Providers (APPs -  Physician Assistants and Nurse Practitioners) who all work together to provide you with the care you need, when you need it.  We recommend signing up for the patient portal called "MyChart".  Sign up information is provided on this After Visit Summary.  MyChart is used to connect with patients for Virtual Visits (Telemedicine).  Patients are able to view lab/test results, encounter notes, upcoming appointments, etc.  Non-urgent messages can be sent to your provider as well.   To learn more about what you can do with MyChart, go to [email or web address].    Your next appointment:   4 week(s)  The format for your next appointment:   In Person  Provider:   You may see Henry W Smith III, MD or one of the following Advanced Practice Providers on your designated Care Team:    Jill McDaniel, NP    Other  Instructions      Signed, Henry W Smith III, MD  05/14/2020 5:13 PM    [facility] 

## 2020-05-14 ENCOUNTER — Other Ambulatory Visit: Payer: Self-pay

## 2020-05-14 ENCOUNTER — Ambulatory Visit (INDEPENDENT_AMBULATORY_CARE_PROVIDER_SITE_OTHER): Payer: Medicare Other | Admitting: Interventional Cardiology

## 2020-05-14 ENCOUNTER — Encounter: Payer: Self-pay | Admitting: Interventional Cardiology

## 2020-05-14 VITALS — BP 142/60 | HR 52 | Ht 64.0 in | Wt 160.0 lb

## 2020-05-14 DIAGNOSIS — I459 Conduction disorder, unspecified: Secondary | ICD-10-CM | POA: Diagnosis not present

## 2020-05-14 DIAGNOSIS — Z952 Presence of prosthetic heart valve: Secondary | ICD-10-CM | POA: Diagnosis not present

## 2020-05-14 DIAGNOSIS — I5032 Chronic diastolic (congestive) heart failure: Secondary | ICD-10-CM | POA: Diagnosis not present

## 2020-05-14 DIAGNOSIS — D696 Thrombocytopenia, unspecified: Secondary | ICD-10-CM

## 2020-05-14 DIAGNOSIS — E785 Hyperlipidemia, unspecified: Secondary | ICD-10-CM

## 2020-05-14 DIAGNOSIS — I10 Essential (primary) hypertension: Secondary | ICD-10-CM

## 2020-05-14 DIAGNOSIS — I480 Paroxysmal atrial fibrillation: Secondary | ICD-10-CM | POA: Diagnosis not present

## 2020-05-14 MED ORDER — FUROSEMIDE 40 MG PO TABS: 20.0000 mg | ORAL_TABLET | ORAL | 1 refills | Status: DC

## 2020-05-14 MED ORDER — POTASSIUM CHLORIDE ER 10 MEQ PO TBCR: 10.0000 meq | EXTENDED_RELEASE_TABLET | ORAL | 1 refills | Status: DC

## 2020-05-14 NOTE — Patient Instructions (Signed)
Medication Instructions:  1) DECREASE Furosemide and Potassium to just Monday, Wednesday and Friday.  After two weeks, if you are feeling ok, you may discontinue both medications.  Let us know if you start to develop increased shortness of breath, swelling or weight gain.  *If you need a refill on your cardiac medications before your next appointment, please call your pharmacy*   Lab Work: None If you have labs (blood work) drawn today and your tests are completely normal, you will receive your results only by: Marland Kitchen MyChart Message (if you have MyChart) OR . A paper copy in the mail If you have any lab test that is abnormal or we need to change your treatment, we will call you to review the results.   Testing/Procedures: None   Follow-Up: At [facility], you and your health needs are our priority.  As part of our continuing mission to provide you with exceptional heart care, we have created designated Provider Care Teams.  These Care Teams include your primary Cardiologist (physician) and Advanced Practice Providers (APPs -  Physician Assistants and Nurse Practitioners) who all work together to provide you with the care you need, when you need it.  We recommend signing up for the patient portal called "MyChart".  Sign up information is provided on this After Visit Summary.  MyChart is used to connect with patients for Virtual Visits (Telemedicine).  Patients are able to view lab/test results, encounter notes, upcoming appointments, etc.  Non-urgent messages can be sent to your provider as well.   To learn more about what you can do with MyChart, go to [email or web address].    Your next appointment:   4 week(s)  The format for your next appointment:   In Person  Provider:   You may see Sinclair Grooms, MD or one of the following Advanced Practice Providers on your designated Care Team:    Kathyrn Drown, NP    Other Instructions

## 2020-05-18 ENCOUNTER — Other Ambulatory Visit: Payer: Self-pay | Admitting: *Deleted

## 2020-05-18 NOTE — Patient Outreach (Signed)
[facility]) Care Management  05/18/2020  [name] 1923-04-21 [id number]   Telephone Assessment-Successful-HTN  RN spoke with wife today Gwenlyn [name]) who is recovering from recent surgery. Wife provided an update on pt's ongoing management of care and how he has progressed. Pt recent followed up with his CAD provider Dr. Tamala Julian all all vital were good with pt's blood pressures 142/60 as pt has remained asymptomatic over the last month since the last conservation. Wife states pt has been very active in assisting her ongoing recovery indicating she has not been cooking as much but will soon start back with her routine in cooking for her spouse. Wife also states pt's provider is attempting to wean his potassium and Lasix down to 3 X weekly. RN continue to education spouse to inform pt to reduce his ongoing sodium to avoid increase blood pressure. Other issues related to pt's platelet is low however provider is monitoring this with no intervention assigned at this time. Discuss food items that can increase this value however informed to consult pt's provider prior too an use of OTC or supplement used.   Plan of care review and discussed with notations of pt's progress and related issues. Will update as pt remains on track with managing this condition however slow to fully recovery with no acute issues at this time. Will follow up next month on pt's ongoing progress and continue to update the provider on pt's ongoing progress.  Goals Addressed            This Visit's Progress   . THN-Disease Progression Prevented or Minimized Hypertension   On track    Follow up Date 06/18/2020 Timeframe:  Long-Range Goal Priority:  Medium Start Date:  03/21/2020                            Expected End Date:  06/29/2020                      Evidence-based guidance:   Tailor lifestyle advice to individual; review progress regularly; give frequent encouragement and respond positively to  incremental successes.   Assess for and promote awareness of worsening disease or development of comorbidity.   Prepare patient for laboratory and diagnostic exams based on risk and presentation.   Prepare patient for use of pharmacologic therapy that may include diuretic, beta-blocker, beta-blocker/thiazide combination, angiotensin-converting enzyme inhibitor, renin-angiotensin blocker or calcium-channel blocker.   Expect periodic adjustments to pharmacologic therapy; manage side effects.   Promote a healthy diet that includes primarily plant-based foods, such as fruits, vegetables, whole grains, beans and legumes, low-fat dairy and lean meats.    Consider moderate reduction in sodium intake by avoiding the addition of salt to prepared foods and limiting processed meats, canned soup, frozen meals and salty snacks.    Promote a regular, daily exercise goal of 150 minutes per week of moderate exercise based on tolerance, ability and patient choice; consider referral to physical therapist, community wellness and/or activity program.   Encourage the avoidance of no more than 2 hours per day of sedentary activity, such as recreational screen time.   Review sources of stress; explore current coping strategies and encourage use of mindfulness, yoga, meditation or exercise to manage stress.   Notes:  1/19-Discussed ongoing exercises and healthy habits to lower the risk for elevated blood pressures/stroke. Continue to educate on FAST for stroke prevention measures.    Shauna Hugh Eating  On track    Follow Up Date 06/18/2020 Timeframe:  Long-Range Goal Priority:  Medium Start Date:     02/10/2020                        Expected End Date:    06/29/2020                  - drink 6 to 8 glasses of water each day - limit fast food meals to no more than 1 per week - manage portion size - set a realistic goal - switch to low-fat or skim milk - switch to sugar-free drinks    Why is this  important?   When you are ready to manage your nutrition or weight, having a plan and setting goals will help.  Taking small steps to change how you eat and exercise is a good place to start.    Notes:   03/21/2020-Pt continue to improve with his oral intake however will extend this goal to allow ongoing adherence for increase in his exercises for stability with strengthening and endurance.     Ladona Mow More About My Health   On track    Follow Up Date 06/18/2020 Timeframe:  Long-Range Goal Priority:  Medium Start Date:     03/21/2020                        Expected End Date:      06/29/2020                     - make a list of questions - ask questions - repeat what I heard to make sure I understand - speak up when I don't understand    Why is this important?    The best way to learn about your health and care is by talking to the doctor and nurse.   They will answer your questions and give you information in the way that you like best.    Notes:   1/19- Educate pt on his recent covid positive test along with his LLL pneumonia. Discussed his current treatment plan and the importance of following the recommendations. Continue to encourage healthy habits as he continue to manage his health related problems.     . COMPLETED: THN-Make and Keep All Appointments   On track    Follow Up Date  05/18/2020 Timeframe:  Short-Term Goal Priority:  Medium Start Date:    03/21/2020                         Expected End Date:  05/31/2020                        - arrange a ride through an agency 1 week before appointment - ask family or friend for a ride - call to cancel if needed - keep a calendar with prescription refill dates - keep a calendar with appointment dates    Why is this important?    Part of staying healthy is seeing the doctor for follow-up care.   If you forget your appointments, there are some things you can do to stay on track.    Notes:  2/18-Reports LLL  pneumonia has resolved on the last X-ray. Verified pt continue to attend all appointments with sufficient transportation. 1/19-Discuss all pending follow up appointments and encouraged pt attendance for  follow up X-Ray concerning his recent diagnosis of LLL pneumonia (Co-vid related symptoms).        [name], RN Care Management Coordinator [place] Office [phone]

## 2020-05-24 ENCOUNTER — Inpatient Hospital Stay: Payer: Medicare Other

## 2020-05-24 ENCOUNTER — Inpatient Hospital Stay (HOSPITAL_BASED_OUTPATIENT_CLINIC_OR_DEPARTMENT_OTHER): Payer: Medicare Other | Admitting: Oncology

## 2020-05-24 ENCOUNTER — Other Ambulatory Visit: Payer: Self-pay

## 2020-05-24 VITALS — BP 130/75 | HR 60 | Temp 97.8°F | Resp 17 | Ht 64.0 in | Wt 161.4 lb

## 2020-05-24 DIAGNOSIS — D693 Immune thrombocytopenic purpura: Secondary | ICD-10-CM | POA: Diagnosis not present

## 2020-05-24 LAB — CBC WITH DIFFERENTIAL (CANCER CENTER ONLY)
Abs Immature Granulocytes: 0.06 10*3/uL (ref 0.00–0.07)
Basophils Absolute: 0.1 10*3/uL (ref 0.0–0.1)
Basophils Relative: 2 %
Eosinophils Absolute: 0.2 10*3/uL (ref 0.0–0.5)
Eosinophils Relative: 3 %
HCT: 35.7 % — ABNORMAL LOW (ref 39.0–52.0)
Hemoglobin: 11.4 g/dL — ABNORMAL LOW (ref 13.0–17.0)
Immature Granulocytes: 1 %
Lymphocytes Relative: 21 %
Lymphs Abs: 1.4 10*3/uL (ref 0.7–4.0)
MCH: 27.1 pg (ref 26.0–34.0)
MCHC: 31.9 g/dL (ref 30.0–36.0)
MCV: 85 fL (ref 80.0–100.0)
Monocytes Absolute: 1.4 10*3/uL — ABNORMAL HIGH (ref 0.1–1.0)
Monocytes Relative: 21 %
Neutro Abs: 3.6 10*3/uL (ref 1.7–7.7)
Neutrophils Relative %: 52 %
Platelet Count: 37 10*3/uL — ABNORMAL LOW (ref 150–400)
RBC: 4.2 MIL/uL — ABNORMAL LOW (ref 4.22–5.81)
RDW: 17.7 % — ABNORMAL HIGH (ref 11.5–15.5)
WBC Count: 6.8 10*3/uL (ref 4.0–10.5)
nRBC: 0 % (ref 0.0–0.2)

## 2020-05-24 NOTE — Progress Notes (Signed)
  [place] OFFICE PROGRESS NOTE   Diagnosis: Thrombocytopenia  INTERVAL HISTORY:   [name] returns as scheduled.  He bruises easily.  No other bleeding.  No other complaint.  He is walking, but reports breathing has not returned to baseline.  Objective:  Vital signs in last 24 hours:  Blood pressure 130/75, pulse 60, temperature 97.8 F (36.6 C), temperature source Tympanic, resp. rate 17, height $RemoveBe'5\' 4"'yIQDoegiA$  (1.626 m), weight 161 lb 6.4 oz (73.2 kg), SpO2 98 %.    HEENT: No thrush or bleeding Resp: Lungs clear bilaterally Cardio: Regular rate and rhythm GI: No hepatomegaly Vascular: No leg edema  Skin: Changes of senile purpura/ecchymoses at the dorsum of the hands and forearms bilaterally   Lab Results:  Lab Results  Component Value Date   WBC 6.8 05/24/2020   HGB 11.4 (L) 05/24/2020   HCT 35.7 (L) 05/24/2020   MCV 85.0 05/24/2020   PLT 37 (L) 05/24/2020   NEUTROABS 3.6 05/24/2020    CMP  Lab Results  Component Value Date   NA 136 10/31/2019   K 4.3 10/31/2019   CL 100 10/31/2019   CO2 26 10/31/2019   GLUCOSE 126 (H) 10/31/2019   BUN 23 10/31/2019   CREATININE 1.21 10/31/2019   CALCIUM 9.4 10/31/2019   PROT 4.6 (L) 09/18/2019   ALBUMIN 2.2 (L) 09/18/2019   AST 19 09/18/2019   ALT 21 09/18/2019   ALKPHOS 59 09/18/2019   BILITOT 0.5 09/18/2019   GFRNONAA 51 (L) 10/31/2019   GFRAA 59 (L) 10/31/2019     Medications: I have reviewed the patient's current medications.   Assessment/Plan: 1. Thrombocytopenia ? Bone marrow biopsy 07/08/2016-no evidence of B-cell lymphoma, 40% cellular bone marrow with trilineage hematopoiesis, megakaryocytes present with normal morphology, normal cytogenetics, negative for BRAF mutation ? Flow cytometry 01/05/2009-no monoclonal B-cell or phenotypically abnormal T-cell population ? Prednisone starting 06/29/2019 ? Nplate 07/04/2019, 07/29/4130,GMWNUU 07/18/2019 ? Prednisone 20 mg daily beginning 07/25/2019 ? Platelets  11,000 on 08/15/2019, prednisone increased to 40 mg daily, Nplate resume 09/25/3662 ? Prednisone decreased to 30 mg daily 08/25/2019, Nplate continued ? Prednisone continued at 30 mg daily 09/13/2019, weekly Nplate continued ? Prednisone taper to 20 mg daily 09/23/2019, weekly Nplate continued ? Prednisone taper to 10 mg daily 09/30/2019, weekly Nplate continued ? Prednisone taper to 5 mg daily 10/21/2019, weekly Nplate continued ? Prednisone taper to 5 mg every other day for 5 doses then stop 11/11/2019, weekly Nplate continued ? Nplate last given 06/03/4740 2. Hairy cell leukemia 1982, status post splenectomy 3. Coronary artery disease 4. Aortic stenosis 5. 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 Macular degeneration 7. Hearing loss 8. Basal cell carcinoma left cheek 05/24/2019 9. Left upper lobe nodule, faint FDG activity on PET at [facility] 05/31/2013 10. Status post TAVR procedure 09/06/2019 11. Admission with Pseudomonas urosepsis 09/16/2019 12. Acute left MCA distribution CVA 09/16/2019 13. Covid January 2022    Disposition: [name] appears unchanged.  The platelet count remains low.  I recommended he discontinue aspirin while the platelet count is less than 50,000.  We will initiate treatment for ITP if the platelet count falls to less than 30,000.  We will begin treatment with Nplate or Promacta.  He will call for bleeding.  [name] will return for an office and lab visit in approximately 3 weeks.  Douglas Coder, MD  05/24/2020  12:28 PM

## 2020-05-29 DIAGNOSIS — H353221 Exudative age-related macular degeneration, left eye, with active choroidal neovascularization: Secondary | ICD-10-CM | POA: Diagnosis not present

## 2020-05-31 DIAGNOSIS — R972 Elevated prostate specific antigen [PSA]: Secondary | ICD-10-CM | POA: Diagnosis not present

## 2020-05-31 DIAGNOSIS — N401 Enlarged prostate with lower urinary tract symptoms: Secondary | ICD-10-CM | POA: Diagnosis not present

## 2020-05-31 DIAGNOSIS — R3914 Feeling of incomplete bladder emptying: Secondary | ICD-10-CM | POA: Diagnosis not present

## 2020-06-12 ENCOUNTER — Other Ambulatory Visit: Payer: Self-pay

## 2020-06-12 ENCOUNTER — Telehealth: Payer: Self-pay | Admitting: Oncology

## 2020-06-12 ENCOUNTER — Inpatient Hospital Stay: Payer: Medicare Other

## 2020-06-12 ENCOUNTER — Inpatient Hospital Stay: Payer: Medicare Other | Attending: Nurse Practitioner | Admitting: Oncology

## 2020-06-12 VITALS — BP 134/70 | HR 55 | Temp 98.1°F | Resp 18 | Ht 64.0 in | Wt 165.0 lb

## 2020-06-12 DIAGNOSIS — D649 Anemia, unspecified: Secondary | ICD-10-CM | POA: Insufficient documentation

## 2020-06-12 DIAGNOSIS — D693 Immune thrombocytopenic purpura: Secondary | ICD-10-CM

## 2020-06-12 DIAGNOSIS — Z8616 Personal history of COVID-19: Secondary | ICD-10-CM | POA: Insufficient documentation

## 2020-06-12 LAB — CBC WITH DIFFERENTIAL (CANCER CENTER ONLY)
Abs Immature Granulocytes: 0.06 10*3/uL (ref 0.00–0.07)
Basophils Absolute: 0.1 10*3/uL (ref 0.0–0.1)
Basophils Relative: 2 %
Eosinophils Absolute: 0.2 10*3/uL (ref 0.0–0.5)
Eosinophils Relative: 3 %
HCT: 34 % — ABNORMAL LOW (ref 39.0–52.0)
Hemoglobin: 11 g/dL — ABNORMAL LOW (ref 13.0–17.0)
Immature Granulocytes: 1 %
Lymphocytes Relative: 24 %
Lymphs Abs: 1.6 10*3/uL (ref 0.7–4.0)
MCH: 27.4 pg (ref 26.0–34.0)
MCHC: 32.4 g/dL (ref 30.0–36.0)
MCV: 84.6 fL (ref 80.0–100.0)
Monocytes Absolute: 1.7 10*3/uL — ABNORMAL HIGH (ref 0.1–1.0)
Monocytes Relative: 25 %
Neutro Abs: 3.1 10*3/uL (ref 1.7–7.7)
Neutrophils Relative %: 45 %
Platelet Count: 33 10*3/uL — ABNORMAL LOW (ref 150–400)
RBC: 4.02 MIL/uL — ABNORMAL LOW (ref 4.22–5.81)
RDW: 17.5 % — ABNORMAL HIGH (ref 11.5–15.5)
WBC Count: 6.8 10*3/uL (ref 4.0–10.5)
nRBC: 0 % (ref 0.0–0.2)

## 2020-06-12 NOTE — Telephone Encounter (Signed)
Appointments scheduled /  Patient is requesting AM appointments.  Melissa X rescheduled with patient in room per 4/12 los

## 2020-06-12 NOTE — Progress Notes (Signed)
[facility] [place] OFFICE PROGRESS NOTE   Diagnosis: [name]  INTERVAL HISTORY:   [name] returns as scheduled.  No bleeding.  No new complaint.  He plans to go to the beach later this week.  Objective:  Vital signs in last 24 hours:  Blood pressure 134/70, pulse (!) 55, temperature 98.1 F (36.7 C), temperature source Tympanic, resp. rate 18, height '5\' 4"'  (1.626 m), weight 165 lb (74.8 kg), SpO2 100 %.    HEENT: 2 mm petechial lesion at the right buccal mucosa, no active bleeding, no thrush Resp: Lungs clear bilaterally Cardio: Regular rate and rhythm GI: No hepatomegaly Vascular: Trace lower leg edema bilaterally  Skin: No petechiae    Lab Results:  Lab Results  Component Value Date   WBC 6.8 06/12/2020   HGB 11.0 (L) 06/12/2020   HCT 34.0 (L) 06/12/2020   MCV 84.6 06/12/2020   PLT 33 (L) 06/12/2020   NEUTROABS 3.1 06/12/2020    CMP  Lab Results  Component Value Date   NA 136 10/31/2019   K 4.3 10/31/2019   CL 100 10/31/2019   CO2 26 10/31/2019   GLUCOSE 126 (H) 10/31/2019   BUN 23 10/31/2019   CREATININE 1.21 10/31/2019   CALCIUM 9.4 10/31/2019   PROT 4.6 (L) 09/18/2019   ALBUMIN 2.2 (L) 09/18/2019   AST 19 09/18/2019   ALT 21 09/18/2019   ALKPHOS 59 09/18/2019   BILITOT 0.5 09/18/2019   GFRNONAA 51 (L) 10/31/2019   GFRAA 59 (L) 10/31/2019    Medications: I have reviewed the patient's current medications.   Assessment/Plan: 1. [name] ? Bone marrow biopsy 07/08/2016-no evidence of B-cell lymphoma, 40% cellular bone marrow with trilineage hematopoiesis, megakaryocytes present with normal morphology, normal cytogenetics, negative for BRAF mutation ? Flow cytometry 01/05/2009-no monoclonal B-cell or phenotypically abnormal T-cell population ? Prednisone starting 06/29/2019 ? Nplate 07/04/2019, 5/00/9381,WEXHBZ 07/18/2019 ? Prednisone 20 mg daily beginning 07/25/2019 ? Platelets 11,000 on 08/15/2019, prednisone increased to 40  mg daily, Nplate resume 1/69/6789 ? Prednisone decreased to 30 mg daily 08/25/2019, Nplate continued ? Prednisone continued at 30 mg daily 09/13/2019, weekly Nplate continued ? Prednisone taper to 20 mg daily 09/23/2019, weekly Nplate continued ? Prednisone taper to 10 mg daily 09/30/2019, weekly Nplate continued ? Prednisone taper to 5 mg daily 10/21/2019, weekly Nplate continued ? Prednisone taper to 5 mg every other day for 5 doses then stop 11/11/2019, weekly Nplate continued ? Nplate last given 3/81/0175 2. Hairy cell leukemia 1982, status post splenectomy 3. Coronary artery disease 4. Aortic stenosis 5. 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 Macular degeneration 7. Hearing loss 8. Basal cell carcinoma left cheek 05/24/2019 9. Left upper lobe nodule, faint FDG activity on PET at [facility] 05/31/2013 10. Status post TAVR procedure 09/06/2019 11. Admission with Pseudomonas urosepsis 09/16/2019 12. Acute left MCA distribution CVA 09/16/2019 13. Covid January 2022 14. Mild anemia     Disposition: [name] appears stable.  The platelet count remains low.  No bleeding.  Aspirin is on hold.  We discussed the indication for resuming treatment of the ITP.  We discussed eltrombopag.  We reviewed potential toxicities associated with this agent.  He would like to continue observation for now.  He will call for bleeding or increased bruising.  [name] will return for an office visit and CBC in 2 weeks.  We will check a ferritin level when he returns in 2 weeks He has chronic mild anemia, potentially related to bleeding,  renal insufficiency, or another etiology. Betsy Coder, MD  06/12/2020  11:38 AM

## 2020-06-13 NOTE — Progress Notes (Signed)
Cardiology Office Note:    Date:  06/14/2020   ID:  [name], DOB [birth year], MRN [id number]  PCP:  [name], MD  Cardiologist:  Sinclair Grooms, MD   Referring MD: [name], MD   Chief Complaint  Patient presents with  . Congestive Heart Failure  . Shortness of Breath    History of Present Illness:    [name] is a 87 y.o. male with a hx of  idiopathic thrombocytopenia (Rx'd with Nplate and steroids),bifasicular block (RBBB, LAFB), anemia, anxiety, CAD, colon cancer,HLD, GERD, hairy cell leukemia,PAFfound on monitor,HTN and severe aortic stenosiss/p TAVR (09/06/19), high-grade AV block that resolved without pacemaker, CVA,followed by a readmission for urosepsis/bacteremia.    Follow-up after decreasing diuretic regimen.  Saw is a little more short of breath on exertion since the diuretic was discontinued.  No change in lower extremity trace edema. His weight is stable, up 2 pounds since diuretic was discontinued.  He denies orthopnea.  Past Medical History:  Diagnosis Date  . Acute appendicitis   . Anemia   . Anxiety   . Arthritis    "back" (03/14/2014)  . Blood dyscrasia    hairy cell leukemia  . CAD (coronary artery disease)    a. 03/14/14  s/p overlapping DES x2 to mid-distal RCA.  . 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 Colon cancer ([place]) 1984  . Compression fracture of lumbar spine, non-traumatic ([place])   . DJD (degenerative joint disease) of lumbar spine   . Dyslipidemia   . Dyspnea   . Elevated PSA   . GERD (gastroesophageal reflux disease)   . Glaucoma   . Hairy cell leukemia ([place]) dx'd 1980  . Heart murmur   . History of blood transfusion    "several; related to hairy cell leukemia & tx "  . History of stomach ulcers 1968  . Hypertension   . Leukemia, hairy cell ([place])   . Malnutrition ([place])   . Osteoarthritis   . Osteoporosis   . Pneumonia   . S/P TAVR (transcatheter aortic valve replacement)  09/06/2019   s/p TAVR with a 26 mm Edwards S3U via the TF approach by Dr. Angelena Form and Cyndia Bent.   . Severe aortic stenosis    s/p tavr  . Skin cancer of face     Past Surgical History:  Procedure Laterality Date  . APPENDECTOMY  10/2007  . BUBBLE STUDY  11/02/2019   Procedure: BUBBLE STUDY;  Surgeon: Elouise Munroe, MD;  Location: [facility] ENDOSCOPY;  Service: Cardiology;;  . CATARACT EXTRACTION, BILATERAL Bilateral 02/2008  . COLON SURGERY  1984   "sigmoid; open"  . CORONARY ANGIOPLASTY WITH STENT PLACEMENT  03/14/2014   "2"  . EYE SURGERY Bilateral    cataract  . FRACTURE SURGERY    . LEFT HEART CATHETERIZATION WITH CORONARY ANGIOGRAM N/A 03/14/2014   Procedure: LEFT HEART CATHETERIZATION WITH CORONARY ANGIOGRAM;  Surgeon: Peter M Martinique, MD;  Location: [facility] CATH LAB;  Service: Cardiovascular;  Laterality: N/A;  . LIPOMA EXCISION Right 07/2011   liposarcoma resection; "back"  . MOHS SURGERY Left 02/2011  . MOHS SURGERY  X 3   "all on my face"  . MOLE REMOVAL Left 1985   cheek  . ORIF ANKLE FRACTURE Left 2000  . RIGHT/LEFT HEART CATH AND CORONARY ANGIOGRAPHY N/A 07/26/2019   Procedure: RIGHT/LEFT HEART CATH AND CORONARY ANGIOGRAPHY;  Surgeon: Belva Crome, MD;  Location: [place] CV LAB;  Service: Cardiovascular;  Laterality: N/A;  .  SHOULDER SURGERY  04/2004  . SPLENECTOMY  1992  . TEE WITHOUT CARDIOVERSION N/A 09/06/2019   Procedure: TRANSESOPHAGEAL ECHOCARDIOGRAM (TEE);  Surgeon: Burnell Blanks, MD;  Location: [place] CV LAB;  Service: Open Heart Surgery;  Laterality: N/A;  . TEE WITHOUT CARDIOVERSION N/A 11/02/2019   Procedure: TRANSESOPHAGEAL ECHOCARDIOGRAM (TEE);  Surgeon: Elouise Munroe, MD;  Location: [place];  Service: Cardiology;  Laterality: N/A;  . TONSILLECTOMY AND ADENOIDECTOMY  1939  . TRANSCATHETER AORTIC VALVE REPLACEMENT, TRANSFEMORAL N/A 09/06/2019   Procedure: TRANSCATHETER AORTIC VALVE REPLACEMENT, TRANSFEMORAL;  Surgeon: Burnell Blanks,  MD;  Location: [place] CV LAB;  Service: Open Heart Surgery;  Laterality: N/A;    Current Medications: Current Meds  Medication Sig  . acetaminophen (TYLENOL) 500 MG tablet Take 1,000 mg by mouth every 8 (eight) hours as needed for moderate pain.  Marland Kitchen amoxicillin (AMOXIL) 500 MG tablet Take 4 capsules (2,000 mg) one hour prior to all dental visits.  Marland Kitchen atorvastatin (LIPITOR) 20 MG tablet TAKE 1 TABLET BY MOUTH DAILY AT 6 PM  . Cholecalciferol (VITAMIN D3) 50 MCG (2000 UT) TABS Take 2,000 Units by mouth every evening.  Marland Kitchen desonide (DESOWEN) 0.05 % cream Apply 1 application topically 2 (two) times daily as needed (rash/irritation.). Only takes as needed  . dexamethasone (DECADRON) 0.1 % ophthalmic suspension Place 1 drop into both eyes 2 (two) times daily.  . finasteride (PROSCAR) 5 MG tablet Take 5 mg by mouth daily.  . fluocinonide (LIDEX) 0.05 % external solution Apply 1 application topically 2 (two) times daily as needed (apply to ears).   . latanoprost (XALATAN) 0.005 % ophthalmic solution Place 1 drop into both eyes at bedtime.   . metoprolol tartrate (LOPRESSOR) 25 MG tablet Take 0.5 tablets (12.5 mg total) by mouth daily.  . Multiple Vitamins-Minerals (PRESERVISION AREDS 2 PO) Take 1 capsule by mouth 2 (two) times daily.   . mupirocin ointment (BACTROBAN) 2 % Apply to affected area  . nitroGLYCERIN (NITROSTAT) 0.4 MG SL tablet Place 1 tablet (0.4 mg total) under the tongue every 5 (five) minutes as needed for chest pain. Please make yearly appt with Dr. Tamala Julian for September. 1st attempt  . ofloxacin (FLOXIN) 0.3 % OTIC solution SMARTSIG:Left Ear  . pantoprazole (PROTONIX) 40 MG tablet Take 1 tablet (40 mg total) by mouth daily.  . ranibizumab (LUCENTIS) 0.5 MG/0.05ML SOLN 0.5 mg by Intravitreal route every 6 (six) weeks. Left Eye ONLY  . saccharomyces boulardii (FLORASTOR) 250 MG capsule 1 capsule BID x 1 mo, then daily  . sertraline (ZOLOFT) 50 MG tablet Take 50 mg by mouth every  evening.   . tamsulosin (FLOMAX) 0.4 MG CAPS capsule Take 0.8 mg by mouth daily after supper.   . timolol (BETIMOL) 0.5 % ophthalmic solution Place 1 drop into the right eye daily.   . [DISCONTINUED] aspirin EC 81 MG EC tablet Take 1 tablet (81 mg total) by mouth daily. Swallow whole.  . [DISCONTINUED] furosemide (LASIX) 40 MG tablet Take 0.5 tablets (20 mg total) by mouth every Monday, Wednesday, and Friday.  . [DISCONTINUED] potassium chloride (KLOR-CON) 10 MEQ tablet Take 1 tablet (10 mEq total) by mouth every Monday, Wednesday, and Friday.     Allergies:   Antazoline; Antihistamines, chlorpheniramine-type; and Sulfa antibiotics   Social History   Socioeconomic History  . Marital status: Married    Spouse name: Not on file  . Number of children: Not on file  . Years of education: 32  . Highest education  level: Bachelor's degree (e.g., BA, AB, BS)  Occupational History  . Occupation: Retired  Tobacco Use  . Smoking status: Former Smoker    Packs/day: 0.25    Years: 1.00    Pack years: 0.25    Types: Cigarettes, Cigars  . Smokeless tobacco: Never Used  . Tobacco comment: occasional social smoker during college.  Vaping Use  . Vaping Use: Never used  Substance and Sexual Activity  . Alcohol use: Yes    Alcohol/week: 9.0 standard drinks    Types: 2 Glasses of wine, 2 Shots of liquor, 5 Standard drinks or equivalent per week    Comment: socially  . Drug use: No  . Sexual activity: Not Currently  Other Topics Concern  . Not on file  Social History Narrative  . Not on file   Social Determinants of Health   Financial Resource Strain: Not on file  Food Insecurity: No Food Insecurity  . Worried About Charity fundraiser in the Last Year: Never true  . Ran Out of Food in the Last Year: Never true  Transportation Needs: No Transportation Needs  . Lack of Transportation (Medical): No  . Lack of Transportation (Non-Medical): No  Physical Activity: Not on file  Stress: Not on  file  Social Connections: Not on file     Family History: The patient's family history includes Congestive Heart Failure (age of onset: 69) in his father; Diabetes Mellitus II in his brother; Heart Problems in his brother; Prostate cancer in his brother; Stroke in his mother.  ROS:   Please see the history of present illness.    No orthopnea, worsening of lower extremity edema, or other complaints.  All other systems reviewed and are negative.  EKGs/Labs/Other Studies Reviewed:    The following studies were reviewed today: Most recent platelet count is 33,000.  Hemoglobin is 11.4.  Creatinine done in August was 1.2.  EKG:  EKG not repeated  Recent Labs: 09/02/2019: B Natriuretic Peptide 296.9 09/18/2019: ALT 21 09/21/2019: Magnesium 1.9 10/31/2019: BUN 23; Creatinine, Ser 1.21; Potassium 4.3; Sodium 136 06/12/2020: Hemoglobin 11.0; Platelet Count 33  Recent Lipid Panel    Component Value Date/Time   CHOL 96 09/17/2019 0752   TRIG 115 09/17/2019 0752   HDL 30 (L) 09/17/2019 0752   CHOLHDL 3.2 09/17/2019 0752   VLDL 23 09/17/2019 0752   LDLCALC 43 09/17/2019 0752    Physical Exam:    VS:  BP 120/60 (BP Location: Left Arm, Patient Position: Sitting, Cuff Size: Normal)   Pulse (!) 57   Ht 5\' 4"  (1.626 m)   Wt 164 lb (74.4 kg)   SpO2 97%   BMI 28.15 kg/m     Wt Readings from Last 3 Encounters:  06/14/20 164 lb (74.4 kg)  06/12/20 165 lb (74.8 kg)  05/24/20 161 lb 6.4 oz (73.2 kg)     GEN: Compatible with age. No acute distress HEENT: Normal NECK: No JVD. LYMPHATICS: No lymphadenopathy CARDIAC: 1/6 systolic but no diastolic murmur. RRR no gallop, or edema. VASCULAR:  Normal Pulses. No bruits. RESPIRATORY:  Clear to auscultation without rales, wheezing or rhonchi  ABDOMEN: Soft, non-tender, non-distended, No pulsatile mass, MUSCULOSKELETAL: No deformity  SKIN: Warm and dry NEUROLOGIC:  Alert and oriented x 3 PSYCHIATRIC:  Normal affect   ASSESSMENT:    1. S/P TAVR  (transcatheter aortic valve replacement)   2. Essential hypertension   3. Chronic diastolic CHF (congestive heart failure) ([place])   4. PAF (paroxysmal atrial fibrillation) ([place])  5. Dyslipidemia   6. Anemia with low platelet count (HCC)   7. Conduction disorder, unspecified   8. SOB (shortness of breath)    PLAN:    In order of problems listed above:  1. Normally functioning TAVR valve 2. Blood pressure is doing fine off diuretic therapy 3. Will check basic metabolic panel today. 4. Clinically in sinus rhythm 5. Not discussed 6. Stable 7. Need to consider chronotropic incompetence as a reason for shortness of breath. 8. Will check BNP.  Consider chronotropic incompetence as a source of dyspnea on exertion.  For now we will stay off furosemide.  He has not noticed any significant improvement in the way he has felt.  Plan 64-month follow-up.  May add Aldactone or return to Lasix depending upon laboratory data.   Medication Adjustments/Labs and Tests Ordered: Current medicines are reviewed at length with the patient today.  Concerns regarding medicines are outlined above.  Orders Placed This Encounter  Procedures  . Basic metabolic panel  . Pro b natriuretic peptide   No orders of the defined types were placed in this encounter.   Patient Instructions  Medication Instructions:  Your physician recommends that you continue on your current medications as directed. Please refer to the Current Medication list given to you today.  *If you need a refill on your cardiac medications before your next appointment, please call your pharmacy*   Lab Work: BMET and Pro BNP today  If you have labs (blood work) drawn today and your tests are completely normal, you will receive your results only by: Marland Kitchen MyChart Message (if you have MyChart) OR . A paper copy in the mail If you have any lab test that is abnormal or we need to change your treatment, we will call you to review the  results.   Testing/Procedures: None   Follow-Up: At [facility], you and your health needs are our priority.  As part of our continuing mission to provide you with exceptional heart care, we have created designated Provider Care Teams.  These Care Teams include your primary Cardiologist (physician) and Advanced Practice Providers (APPs -  Physician Assistants and Nurse Practitioners) who all work together to provide you with the care you need, when you need it.  We recommend signing up for the patient portal called "MyChart".  Sign up information is provided on this After Visit Summary.  MyChart is used to connect with patients for Virtual Visits (Telemedicine).  Patients are able to view lab/test results, encounter notes, upcoming appointments, etc.  Non-urgent messages can be sent to your provider as well.   To learn more about what you can do with MyChart, go to [email or web address].    Your next appointment:   4-6 month(s)  The format for your next appointment:   In Person  Provider:   You may see Sinclair Grooms, MD or one of the following Advanced Practice Providers on your designated Care Team:    Kathyrn Drown, NP    Other Instructions      Signed, Sinclair Grooms, MD  06/14/2020 8:46 AM    [place]

## 2020-06-14 ENCOUNTER — Encounter: Payer: Self-pay | Admitting: Interventional Cardiology

## 2020-06-14 ENCOUNTER — Ambulatory Visit (INDEPENDENT_AMBULATORY_CARE_PROVIDER_SITE_OTHER): Payer: Medicare Other | Admitting: Interventional Cardiology

## 2020-06-14 ENCOUNTER — Other Ambulatory Visit: Payer: Self-pay

## 2020-06-14 VITALS — BP 120/60 | HR 57 | Ht 64.0 in | Wt 164.0 lb

## 2020-06-14 DIAGNOSIS — I480 Paroxysmal atrial fibrillation: Secondary | ICD-10-CM

## 2020-06-14 DIAGNOSIS — E785 Hyperlipidemia, unspecified: Secondary | ICD-10-CM

## 2020-06-14 DIAGNOSIS — Z952 Presence of prosthetic heart valve: Secondary | ICD-10-CM | POA: Diagnosis not present

## 2020-06-14 DIAGNOSIS — I459 Conduction disorder, unspecified: Secondary | ICD-10-CM

## 2020-06-14 DIAGNOSIS — R0602 Shortness of breath: Secondary | ICD-10-CM | POA: Diagnosis not present

## 2020-06-14 DIAGNOSIS — I5032 Chronic diastolic (congestive) heart failure: Secondary | ICD-10-CM | POA: Diagnosis not present

## 2020-06-14 DIAGNOSIS — D696 Thrombocytopenia, unspecified: Secondary | ICD-10-CM | POA: Diagnosis not present

## 2020-06-14 DIAGNOSIS — I10 Essential (primary) hypertension: Secondary | ICD-10-CM

## 2020-06-14 NOTE — Patient Instructions (Signed)
Medication Instructions:  Your physician recommends that you continue on your current medications as directed. Please refer to the Current Medication list given to you today.  *If you need a refill on your cardiac medications before your next appointment, please call your pharmacy*   Lab Work: BMET and Pro BNP today  If you have labs (blood work) drawn today and your tests are completely normal, you will receive your results only by: Marland Kitchen MyChart Message (if you have MyChart) OR . A paper copy in the mail If you have any lab test that is abnormal or we need to change your treatment, we will call you to review the results.   Testing/Procedures: None   Follow-Up: At [facility], you and your health needs are our priority.  As part of our continuing mission to provide you with exceptional heart care, we have created designated Provider Care Teams.  These Care Teams include your primary Cardiologist (physician) and Advanced Practice Providers (APPs -  Physician Assistants and Nurse Practitioners) who all work together to provide you with the care you need, when you need it.  We recommend signing up for the patient portal called "MyChart".  Sign up information is provided on this After Visit Summary.  MyChart is used to connect with patients for Virtual Visits (Telemedicine).  Patients are able to view lab/test results, encounter notes, upcoming appointments, etc.  Non-urgent messages can be sent to your provider as well.   To learn more about what you can do with MyChart, go to [email or web address].    Your next appointment:   4-6 month(s)  The format for your next appointment:   In Person  Provider:   You may see Sinclair Grooms, MD or one of the following Advanced Practice Providers on your designated Care Team:    Kathyrn Drown, NP    Other Instructions

## 2020-06-15 LAB — BASIC METABOLIC PANEL
BUN/Creatinine Ratio: 21 (ref 10–24)
BUN: 25 mg/dL (ref 10–36)
CO2: 20 mmol/L (ref 20–29)
Calcium: 8.5 mg/dL — ABNORMAL LOW (ref 8.6–10.2)
Chloride: 108 mmol/L — ABNORMAL HIGH (ref 96–106)
Creatinine, Ser: 1.17 mg/dL (ref 0.76–1.27)
Glucose: 104 mg/dL — ABNORMAL HIGH (ref 65–99)
Potassium: 4.1 mmol/L (ref 3.5–5.2)
Sodium: 142 mmol/L (ref 134–144)
eGFR: 58 mL/min/{1.73_m2} — ABNORMAL LOW (ref >59)

## 2020-06-15 LAB — PRO B NATRIURETIC PEPTIDE: NT-Pro BNP: 559 pg/mL — ABNORMAL HIGH (ref 0–486)

## 2020-06-18 ENCOUNTER — Other Ambulatory Visit: Payer: Self-pay | Admitting: *Deleted

## 2020-06-18 NOTE — Patient Outreach (Signed)
[facility]) Care Management  06/18/2020  [name] February 09, 1924 [id number]   Telephone Assessment-Unsuccessful  RN attempted outreach call however unsuccessful. RN able to leave a HIPAA approved voice message requesting a call back.   Will scheduled another outreach call over the next week for ongoing case management services.  [name], RN Care Management Coordinator [place] Office [phone]

## 2020-06-25 ENCOUNTER — Other Ambulatory Visit: Payer: Self-pay | Admitting: *Deleted

## 2020-06-28 ENCOUNTER — Other Ambulatory Visit: Payer: Self-pay | Admitting: *Deleted

## 2020-06-28 DIAGNOSIS — H353110 Nonexudative age-related macular degeneration, right eye, stage unspecified: Secondary | ICD-10-CM | POA: Diagnosis not present

## 2020-06-28 DIAGNOSIS — H43812 Vitreous degeneration, left eye: Secondary | ICD-10-CM | POA: Diagnosis not present

## 2020-06-28 DIAGNOSIS — H353221 Exudative age-related macular degeneration, left eye, with active choroidal neovascularization: Secondary | ICD-10-CM | POA: Diagnosis not present

## 2020-06-28 DIAGNOSIS — H35373 Puckering of macula, bilateral: Secondary | ICD-10-CM | POA: Diagnosis not present

## 2020-06-28 DIAGNOSIS — Z961 Presence of intraocular lens: Secondary | ICD-10-CM | POA: Diagnosis not present

## 2020-06-28 DIAGNOSIS — H35412 Lattice degeneration of retina, left eye: Secondary | ICD-10-CM | POA: Diagnosis not present

## 2020-06-28 NOTE — Patient Outreach (Signed)
[facility]) Care Management  06/28/2020  [name] Nov 16, 1923 [id number]  Telephone Assessment-Successful-HTN  Spoke with pt's via intercom today alone with his wife [name]). Pt recent received eye injection (history through the year). This improves his eyesight. Wife reports Dr. Tamala [name] (CAD) has taken pt off of Lasix and Potassium. Denies any changes related to discontinuing these medications. Pt has a planned follow up with this provider  Soon.   Reviewed and discussed the current plan of care. Notations made within the plan of care and RN will continue to education and encouraged adherence.No further needs presented at this time.   Will follow up next month with ongoing case management services.    Goals Addressed            This Visit's Progress   . THN-Disease Progression Prevented or Minimized Hypertension   On track    Follow up Date 07/25/2020 Timeframe:  Long-Range Goal Priority:  Medium Start Date:  03/21/2020                            Expected End Date:  09/28/2020                     Barriers: Health Behaviors  Evidence-based guidance:   Tailor lifestyle advice to individual; review progress regularly; give frequent encouragement and respond positively to incremental successes.   Assess for and promote awareness of worsening disease or development of comorbidity.   Prepare patient for laboratory and diagnostic exams based on risk and presentation.   Prepare patient for use of pharmacologic therapy that may include diuretic, beta-blocker, beta-blocker/thiazide combination, angiotensin-converting enzyme inhibitor, renin-angiotensin blocker or calcium-channel blocker.   Expect periodic adjustments to pharmacologic therapy; manage side effects.   Promote a healthy diet that includes primarily plant-based foods, such as fruits, vegetables, whole grains, beans and legumes, low-fat dairy and lean meats.    Consider moderate reduction in sodium  intake by avoiding the addition of salt to prepared foods and limiting processed meats, canned soup, frozen meals and salty snacks.    Promote a regular, daily exercise goal of 150 minutes per week of moderate exercise based on tolerance, ability and patient choice; consider referral to physical therapist, community wellness and/or activity program.   Encourage the avoidance of no more than 2 hours per day of sedentary activity, such as recreational screen time.   Review sources of stress; explore current coping strategies and encourage use of mindfulness, yoga, meditation or exercise to manage stress.   Notes:  4/28-Pt continues to manage his care with the assistance from his wife [name]). Pt received eye injections to improve his vision and has discontinued 2 medications (Lasix and Potassium).  RN discussed when to reach out to the pt's provider with any fluid retention or related issues for earlier intervention. Reiterated on low sodium dietary habits due to no long on the medication Lasix and verified pt/provider are watching these levels. Will continue to encourage adherence with managing this condition and meeting this goal. 1/19-Discussed ongoing exercises and healthy habits to lower the risk for elevated blood pressures/stroke. Continue to educate on FAST for stroke prevention measures.    . COMPLETED: THN-Healthy Eating   On track    Follow Up Date 06/18/2020 Timeframe:  Long-Range Goal Priority:  Medium Start Date:     02/10/2020  Expected End Date:    06/29/2020                  - drink 6 to 8 glasses of water each day - limit fast food meals to no more than 1 per week - manage portion size - set a realistic goal - switch to low-fat or skim milk - switch to sugar-free drinks    Why is this important?   When you are ready to manage your nutrition or weight, having a plan and setting goals will help.  Taking small steps to change how you eat and exercise is a  good place to start.    Notes:   03/21/2020-Pt continue to improve with his oral intake however will extend this goal to allow ongoing adherence for increase in his exercises for stability with strengthening and endurance.     [name] More About My Health   On track    Follow Up Date 07/25/2020 Timeframe:  Long-Range Goal Priority:  Medium Start Date:     03/21/2020                        Expected End Date:      09/28/2020                 Barriers: Health Behaviors Knowledge    - make a list of questions - ask questions - repeat what I heard to make sure I understand - speak up when I don't understand    Why is this important?    The best way to learn about your health and care is by talking to the doctor and nurse.   They will answer your questions and give you information in the way that you like best.    Notes:  4/28-Will continue to educate pt on healthy eating habits with low sodium and receive teach-back method ensuring pt has a complete understanding of managing his care independently alone with the spouse's assistance.  1/19- Educate pt on his recent covid positive test along with his LLL pneumonia. Discussed his current treatment plan and the importance of following the recommendations. Continue to encourage healthy habits as he continue to manage his health related problems.        Douglas Mina, RN Care Management Coordinator [place] Office [phone]

## 2020-06-29 ENCOUNTER — Ambulatory Visit: Payer: Medicare Other | Admitting: Nurse Practitioner

## 2020-06-29 ENCOUNTER — Other Ambulatory Visit: Payer: Self-pay

## 2020-06-29 ENCOUNTER — Other Ambulatory Visit: Payer: Medicare Other

## 2020-06-29 ENCOUNTER — Encounter: Payer: Self-pay | Admitting: Nurse Practitioner

## 2020-06-29 ENCOUNTER — Inpatient Hospital Stay (HOSPITAL_BASED_OUTPATIENT_CLINIC_OR_DEPARTMENT_OTHER): Payer: Medicare Other | Admitting: Nurse Practitioner

## 2020-06-29 ENCOUNTER — Inpatient Hospital Stay: Payer: Medicare Other

## 2020-06-29 VITALS — BP 137/73 | HR 63 | Temp 97.8°F | Resp 18 | Ht 64.0 in | Wt 166.5 lb

## 2020-06-29 DIAGNOSIS — Z8616 Personal history of COVID-19: Secondary | ICD-10-CM | POA: Diagnosis not present

## 2020-06-29 DIAGNOSIS — D693 Immune thrombocytopenic purpura: Secondary | ICD-10-CM

## 2020-06-29 DIAGNOSIS — D649 Anemia, unspecified: Secondary | ICD-10-CM | POA: Diagnosis not present

## 2020-06-29 LAB — CBC WITH DIFFERENTIAL (CANCER CENTER ONLY)
Abs Immature Granulocytes: 0.06 10*3/uL (ref 0.00–0.07)
Basophils Absolute: 0.1 10*3/uL (ref 0.0–0.1)
Basophils Relative: 2 %
Eosinophils Absolute: 0.3 10*3/uL (ref 0.0–0.5)
Eosinophils Relative: 4 %
HCT: 36.8 % — ABNORMAL LOW (ref 39.0–52.0)
Hemoglobin: 11.8 g/dL — ABNORMAL LOW (ref 13.0–17.0)
Immature Granulocytes: 1 %
Lymphocytes Relative: 19 %
Lymphs Abs: 1.3 10*3/uL (ref 0.7–4.0)
MCH: 27.4 pg (ref 26.0–34.0)
MCHC: 32.1 g/dL (ref 30.0–36.0)
MCV: 85.4 fL (ref 80.0–100.0)
Monocytes Absolute: 1.5 10*3/uL — ABNORMAL HIGH (ref 0.1–1.0)
Monocytes Relative: 21 %
Neutro Abs: 3.7 10*3/uL (ref 1.7–7.7)
Neutrophils Relative %: 53 %
Platelet Count: 39 10*3/uL — ABNORMAL LOW (ref 150–400)
RBC: 4.31 MIL/uL (ref 4.22–5.81)
RDW: 17.2 % — ABNORMAL HIGH (ref 11.5–15.5)
WBC Count: 6.9 10*3/uL (ref 4.0–10.5)
nRBC: 0 % (ref 0.0–0.2)

## 2020-06-29 LAB — BASIC METABOLIC PANEL - CANCER CENTER ONLY
Anion gap: 8 (ref 5–15)
BUN: 18 mg/dL (ref 8–23)
CO2: 26 mmol/L (ref 22–32)
Calcium: 9.1 mg/dL (ref 8.9–10.3)
Chloride: 108 mmol/L (ref 98–111)
Creatinine: 1.16 mg/dL (ref 0.61–1.24)
GFR, Estimated: 58 mL/min — ABNORMAL LOW (ref >60)
Glucose, Bld: 118 mg/dL — ABNORMAL HIGH (ref 70–99)
Potassium: 4.2 mmol/L (ref 3.5–5.1)
Sodium: 142 mmol/L (ref 135–145)

## 2020-06-29 LAB — FERRITIN: Ferritin: 19 ng/mL — ABNORMAL LOW (ref 24–336)

## 2020-06-29 NOTE — Progress Notes (Addendum)
  [place] OFFICE PROGRESS NOTE   Diagnosis: Thrombocytopenia  INTERVAL HISTORY:   [name] returns as scheduled.  States he is doing "fair".  Continues to note easy bruising.  He hit the left shin on a trailer hitch.  He has a bruise in this area.  Objective:  Vital signs in last 24 hours:  Blood pressure 137/73, pulse 63, temperature 97.8 F (36.6 C), temperature source Oral, resp. rate 18, height $RemoveBe'5\' 4"'jQYpCItKX$  (1.626 m), weight 166 lb 8 oz (75.5 kg), SpO2 99 %.    HEENT: No ecchymosis/petechiae oral cavity.  No thrush. Resp: Lungs clear bilaterally. Cardio: Regular rate and rhythm. GI: Abdomen soft and nontender.  No hepatosplenomegaly. Vascular: Trace edema lower leg bilaterally.  Skin: No petechiae.  Ecchymosis with central abrasion left mid shin region.   Lab Results:  Lab Results  Component Value Date   WBC 6.9 06/29/2020   HGB 11.8 (L) 06/29/2020   HCT 36.8 (L) 06/29/2020   MCV 85.4 06/29/2020   PLT 39 (L) 06/29/2020   NEUTROABS 3.7 06/29/2020    Imaging:  No results found.  Medications: I have reviewed the patient's current medications.  Assessment/Plan: 1. Thrombocytopenia ? Bone marrow biopsy 07/08/2016-no evidence of B-cell lymphoma, 40% cellular bone marrow with trilineage hematopoiesis, megakaryocytes present with normal morphology, normal cytogenetics, negative for BRAF mutation ? Flow cytometry 01/05/2009-no monoclonal B-cell or phenotypically abnormal T-cell population ? Prednisone starting 06/29/2019 ? Nplate 07/04/2019, 3/36/1224,SLPNPY 07/18/2019 ? Prednisone 20 mg daily beginning 07/25/2019 ? Platelets 11,000 on 08/15/2019, prednisone increased to 40 mg daily, Nplate resume 0/51/1021 ? Prednisone decreased to 30 mg daily 08/25/2019, Nplate continued ? Prednisone continued at 30 mg daily 09/13/2019, weekly Nplate continued ? Prednisone taper to 20 mg daily 09/23/2019, weekly Nplate continued ? Prednisone taper to 10 mg daily 09/30/2019, weekly  Nplate continued ? Prednisone taper to 5 mg daily 10/21/2019, weekly Nplate continued ? Prednisone taper to 5 mg every other day for 5 doses then stop 11/11/2019, weekly Nplate continued ? Nplate last given 03/19/3565 2. Hairy cell leukemia 1982, status post splenectomy 3. Coronary artery disease 4. Aortic stenosis 5. 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 Macular degeneration 7. Hearing loss 8. Basal cell carcinoma left cheek 05/24/2019 9. Left upper lobe nodule, faint FDG activity on PET at [facility] 05/31/2013 10. Status post TAVR procedure 09/06/2019 11. Admission with Pseudomonas urosepsis 09/16/2019 12. Acute left MCA distribution CVA 09/16/2019 13. Covid January 2022 14. Mild anemia   Disposition: [name] appears stable.  We reviewed the CBC.  He has stable thrombocytopenia, hemoglobin improved.  We will follow-up on the ferritin from today.  We again discussed Promacta.  He would like to continue observation for now.  He understands to contact the office with increased bruising or bleeding.  He will return for a CBC and follow-up visit in 3 weeks.  We are available to see him sooner if needed.  Patient seen with Dr. Benay Spice.   [name] ANP/GNP-BC   06/29/2020  9:37 AM This was a shared visit with [name].  [name] appears unchanged.  The platelet count is stable.  He would like to continue observation as opposed to beginning Promacta.  He has mild anemia.  We will follow up on the ferritin level from today.  I was present for greater than 50% of today's visit.  I performed medical decision making.  Julieanne Manson, MD

## 2020-07-03 MED ORDER — AMOXICILLIN 500 MG PO TABS: ORAL_TABLET | ORAL | 11 refills | Status: DC

## 2020-07-04 ENCOUNTER — Telehealth: Payer: Self-pay

## 2020-07-04 NOTE — Telephone Encounter (Signed)
Called left message concerning recent labs iron low per provider recommendation to begin ferrous sulfate 325mg  QD encouraged pt to call for any question concerns or changes

## 2020-07-04 NOTE — Telephone Encounter (Signed)
-----   Message from Owens Shark, NP sent at 07/02/2020  4:50 PM EDT ----- Please let him know iron stores are low, Dr Benay Spice would like him to begin ferrous sulfate 325 mg twice a day.

## 2020-07-10 ENCOUNTER — Other Ambulatory Visit: Payer: Self-pay | Admitting: Physician Assistant

## 2020-07-10 DIAGNOSIS — Z952 Presence of prosthetic heart valve: Secondary | ICD-10-CM

## 2020-07-23 ENCOUNTER — Other Ambulatory Visit: Payer: Self-pay

## 2020-07-23 ENCOUNTER — Other Ambulatory Visit (HOSPITAL_BASED_OUTPATIENT_CLINIC_OR_DEPARTMENT_OTHER): Payer: Self-pay

## 2020-07-23 ENCOUNTER — Other Ambulatory Visit (HOSPITAL_COMMUNITY): Payer: Self-pay

## 2020-07-23 ENCOUNTER — Encounter: Payer: Self-pay | Admitting: Oncology

## 2020-07-23 ENCOUNTER — Ambulatory Visit: Payer: Medicare Other | Attending: Internal Medicine

## 2020-07-23 ENCOUNTER — Inpatient Hospital Stay: Payer: Medicare Other

## 2020-07-23 ENCOUNTER — Inpatient Hospital Stay (HOSPITAL_BASED_OUTPATIENT_CLINIC_OR_DEPARTMENT_OTHER): Payer: Medicare Other | Admitting: Oncology

## 2020-07-23 ENCOUNTER — Telehealth: Payer: Self-pay | Admitting: Pharmacist

## 2020-07-23 ENCOUNTER — Inpatient Hospital Stay: Payer: Medicare Other | Attending: Nurse Practitioner

## 2020-07-23 ENCOUNTER — Telehealth: Payer: Self-pay | Admitting: Pharmacy Technician

## 2020-07-23 VITALS — BP 146/73 | HR 57 | Temp 98.2°F | Resp 18 | Ht 64.0 in | Wt 165.2 lb

## 2020-07-23 DIAGNOSIS — Z23 Encounter for immunization: Secondary | ICD-10-CM

## 2020-07-23 DIAGNOSIS — D696 Thrombocytopenia, unspecified: Secondary | ICD-10-CM | POA: Insufficient documentation

## 2020-07-23 DIAGNOSIS — D693 Immune thrombocytopenic purpura: Secondary | ICD-10-CM

## 2020-07-23 LAB — CMP (CANCER CENTER ONLY)
ALT: 13 U/L (ref 0–44)
AST: 18 U/L (ref 15–41)
Albumin: 3.9 g/dL (ref 3.5–5.0)
Alkaline Phosphatase: 111 U/L (ref 38–126)
Anion gap: 9 (ref 5–15)
BUN: 30 mg/dL — ABNORMAL HIGH (ref 8–23)
CO2: 25 mmol/L (ref 22–32)
Calcium: 9.3 mg/dL (ref 8.9–10.3)
Chloride: 108 mmol/L (ref 98–111)
Creatinine: 1.28 mg/dL — ABNORMAL HIGH (ref 0.61–1.24)
GFR, Estimated: 52 mL/min — ABNORMAL LOW (ref >60)
Glucose, Bld: 98 mg/dL (ref 70–99)
Potassium: 4.4 mmol/L (ref 3.5–5.1)
Sodium: 142 mmol/L (ref 135–145)
Total Bilirubin: 0.5 mg/dL (ref 0.3–1.2)
Total Protein: 6.6 g/dL (ref 6.5–8.1)

## 2020-07-23 LAB — CBC WITH DIFFERENTIAL (CANCER CENTER ONLY)
Abs Immature Granulocytes: 0.08 10*3/uL — ABNORMAL HIGH (ref 0.00–0.07)
Basophils Absolute: 0.1 10*3/uL (ref 0.0–0.1)
Basophils Relative: 1 %
Eosinophils Absolute: 0.3 10*3/uL (ref 0.0–0.5)
Eosinophils Relative: 4 %
HCT: 37.4 % — ABNORMAL LOW (ref 39.0–52.0)
Hemoglobin: 12 g/dL — ABNORMAL LOW (ref 13.0–17.0)
Immature Granulocytes: 1 %
Lymphocytes Relative: 19 %
Lymphs Abs: 1.4 10*3/uL (ref 0.7–4.0)
MCH: 27.5 pg (ref 26.0–34.0)
MCHC: 32.1 g/dL (ref 30.0–36.0)
MCV: 85.6 fL (ref 80.0–100.0)
Monocytes Absolute: 1.7 10*3/uL — ABNORMAL HIGH (ref 0.1–1.0)
Monocytes Relative: 22 %
Neutro Abs: 3.9 10*3/uL (ref 1.7–7.7)
Neutrophils Relative %: 53 %
Platelet Count: 36 10*3/uL — ABNORMAL LOW (ref 150–400)
RBC: 4.37 MIL/uL (ref 4.22–5.81)
RDW: 16.6 % — ABNORMAL HIGH (ref 11.5–15.5)
WBC Count: 7.5 10*3/uL (ref 4.0–10.5)
nRBC: 0 % (ref 0.0–0.2)

## 2020-07-23 MED ORDER — ELTROMBOPAG OLAMINE 50 MG PO TABS
50.0000 mg | ORAL_TABLET | Freq: Every day | ORAL | 0 refills | Status: DC
  Filled 2020-07-23: qty 30, 30d supply, fill #0

## 2020-07-23 MED ORDER — PFIZER-BIONT COVID-19 VAC-TRIS 30 MCG/0.3ML IM SUSP
INTRAMUSCULAR | 0 refills | Status: DC
  Filled 2020-07-23: qty 0.3, 1d supply, fill #0

## 2020-07-23 NOTE — Progress Notes (Signed)
[facility] OFFICE PROGRESS NOTE   Diagnosis: Thrombocytopenia  INTERVAL HISTORY:   [name] returns as scheduled.  He bruises easily.  No other bleeding.  He continues to have exertional dyspnea.  No new complaint.  He is here today with his wife.  Objective:  Vital signs in last 24 hours:  Blood pressure (!) 146/73, pulse (!) 57, temperature 98.2 F (36.8 C), temperature source Oral, resp. rate 18, height 5' 4" (1.626 m), weight 165 lb 3.2 oz (74.9 kg), SpO2 100 %.    HEENT: Few petechiae at the buccal mucosa Resp: End inspiratory fine rales at the right posterior base, no respiratory distress Cardio: Regular rate and rhythm GI: No hepatosplenomegaly Vascular: No leg edema  Skin: Ecchymoses over the extremities    Lab Results:  Lab Results  Component Value Date   WBC 7.5 07/23/2020   HGB 12.0 (L) 07/23/2020   HCT 37.4 (L) 07/23/2020   MCV 85.6 07/23/2020   PLT 36 (L) 07/23/2020   NEUTROABS 3.9 07/23/2020    CMP  Lab Results  Component Value Date   NA 142 06/29/2020   K 4.2 06/29/2020   CL 108 06/29/2020   CO2 26 06/29/2020   GLUCOSE 118 (H) 06/29/2020   BUN 18 06/29/2020   CREATININE 1.16 06/29/2020   CALCIUM 9.1 06/29/2020   PROT 4.6 (L) 09/18/2019   ALBUMIN 2.2 (L) 09/18/2019   AST 19 09/18/2019   ALT 21 09/18/2019   ALKPHOS 59 09/18/2019   BILITOT 0.5 09/18/2019   GFRNONAA 58 (L) 06/29/2020   GFRAA 59 (L) 10/31/2019     Medications: I have reviewed the patient's current medications.   Assessment/Plan: 1. Thrombocytopenia ? Bone marrow biopsy 07/08/2016-no evidence of B-cell lymphoma, 40% cellular bone marrow with trilineage hematopoiesis, megakaryocytes present with normal morphology, normal cytogenetics, negative for BRAF mutation ? Flow cytometry 01/05/2009-no monoclonal B-cell or phenotypically abnormal T-cell population ? Prednisone starting 06/29/2019 ? Nplate 07/04/2019, 07/11/2019,Nplate 07/18/2019 ? Prednisone 20 mg daily  beginning 07/25/2019 ? Platelets 11,000 on 08/15/2019, prednisone increased to 40 mg daily, Nplate resume 08/17/2019 ? Prednisone decreased to 30 mg daily 08/25/2019, Nplate continued ? Prednisone continued at 30 mg daily 09/13/2019, weekly Nplate continued ? Prednisone taper to 20 mg daily 09/23/2019, weekly Nplate continued ? Prednisone taper to 10 mg daily 09/30/2019, weekly Nplate continued ? Prednisone taper to 5 mg daily 10/21/2019, weekly Nplate continued ? Prednisone taper to 5 mg every other day for 5 doses then stop 11/11/2019, weekly Nplate continued ? Nplate last given 11/21/2019 ? Promacta 07/23/2020 2. Hairy cell leukemia 1982, status post splenectomy 3. Coronary artery disease 4. Aortic stenosis 5. 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 Macular degeneration 7. Hearing loss 8. Basal cell carcinoma left cheek 05/24/2019 9. Left upper lobe nodule, faint FDG activity on PET at [facility] 05/31/2013 10. Status post TAVR procedure 09/06/2019 11. Admission with Pseudomonas urosepsis 09/16/2019 12. Acute left MCA distribution CVA 09/16/2019 13. Covid January 2022 14. Mild anemia    Disposition: [name] appears unchanged.  He has persistent thrombocytopenia.  We discussed the indication for beginning treatment.  The thrombocytopenia responded to prednisone and Nplate last year.  He agrees to a trial of eltrombopag.  We reviewed potential toxicities associated with this agent including the chance of liver toxicity.  He will return for lab visit on 08/06/2020 and an office visit on 08/20/2020.  [name] will receive a COVID-19 booster vaccine today.  Gary Sherrill, MD    07/23/2020  10:06 AM   

## 2020-07-23 NOTE — Telephone Encounter (Signed)
Oral Oncology Pharmacist Encounter  Received new prescription for Promacta (eltrombopag) for the treatment of chronic ITP, planned duration until disease progression or unacceptable drug toxicity.  CMP/CBC from 07/23/20 assessed, no relevant lab abnormalities. Prescription dose and frequency assessed.   Current medication list in Epic reviewed, several DDIs with eltrombopag identified: - Ferrous sulfate and multivitamins: Polyvalent cation containing products, like ferrous sulfate and multivitamins, may decrease the serum concentration of eltrombopag. Administer eltrombopag at least 2 hours before or 4 hours after oral administration of ferrous sulfate  and multivitamins. -Atorvastatin: Eltrombopag may increase the serum concentration of atorvastatin. Monitor patient for increased adverse effects of myopathy or rhabdomyolysis (muscle pain, tenderness, weakness, fever). Of note, patient is only on 20mg  of atorvastatin.   Evaluated chart and no patient barriers to medication adherence identified.   Prescription has been e-scribed to the [facility] for benefits analysis and approval.  Oral Oncology Clinic will continue to follow for insurance authorization, copayment issues, initial counseling and start date.   Darl Pikes, PharmD, BCPS, BCOP, CPP Hematology/Oncology Clinical Pharmacist Practitioner [facility] [place] [facility] [phone]  07/23/2020 3:17 PM

## 2020-07-23 NOTE — Progress Notes (Signed)
   Covid-19 Vaccination Clinic  Name:  [name]    MRN: [id number] DOB: 08-26-1923  07/23/2020  [name] was observed post Covid-19 immunization for 15 minutes without incident. He was provided with Vaccine Information Sheet and instruction to access the V-Safe system.   [name] was instructed to call 911 with any severe reactions post vaccine: Marland Kitchen Difficulty breathing  . Swelling of face and throat  . A fast heartbeat  . A bad rash all over body  . Dizziness and weakness   Immunizations Administered    Name Date Dose VIS Date Route   PFIZER Comrnaty(Gray TOP) Covid-19 Vaccine 07/23/2020 10:53 AM 0.3 mL 02/09/2020 Intramuscular   Manufacturer: [place]   Lot: [id number]   NDC: [phone]

## 2020-07-23 NOTE — Telephone Encounter (Signed)
Oral Oncology Patient Advocate Encounter  Prior Authorization for [name] has been approved.    PA# [id number] Effective dates: 07/23/20 through 07/23/21  Patients co-pay is $2754.31.  Oral Oncology Clinic will continue to follow.   [name] [name] Phone [phone] Fax [phone] 07/23/2020 3:15 PM

## 2020-07-23 NOTE — Telephone Encounter (Signed)
Oral Oncology Patient Advocate Encounter  Received notification from [facility] that prior authorization for Promacta is required.  PA submitted on CoverMyMeds Key [id number] Status is pending  Oral Oncology Clinic will continue to follow.  [place] Patient [place] Phone [phone] Fax [phone] 07/23/2020 1:09 PM

## 2020-07-25 ENCOUNTER — Other Ambulatory Visit: Payer: Self-pay | Admitting: *Deleted

## 2020-07-25 DIAGNOSIS — H353221 Exudative age-related macular degeneration, left eye, with active choroidal neovascularization: Secondary | ICD-10-CM | POA: Diagnosis not present

## 2020-07-25 NOTE — Patient Outreach (Signed)
[name] [facility]) Care Management  07/25/2020  AMOS MICHEALS 16-Sep-1923 [id number]   Case Closure  Pt will be with [facility] for ongoing case management services. Will close this case as the provider's office has already spoken with the pt concerning the change. Spouse expressed her gratitude for [facility] services.  No additional needs on behalf of [facility] ending of services.  [name], RN Care Management Coordinator [place] Office [phone]

## 2020-07-27 ENCOUNTER — Other Ambulatory Visit: Payer: Medicare Other

## 2020-07-27 ENCOUNTER — Ambulatory Visit: Payer: Medicare Other | Admitting: Oncology

## 2020-07-27 ENCOUNTER — Telehealth: Payer: Self-pay | Admitting: *Deleted

## 2020-07-27 DIAGNOSIS — D693 Immune thrombocytopenic purpura: Secondary | ICD-10-CM

## 2020-07-27 NOTE — Telephone Encounter (Signed)
Called back and wife/patient agree to start Nplate on 0/8/81. Scheduling message sent.

## 2020-07-27 NOTE — Telephone Encounter (Signed)
Promacta is cost prohibitive for patient. Called wife to discuss resumption of Nplate weekly. She would like to get him started again, but will discuss w/him today and requests nurse call back this afternoon. The will be able to begin on 6/1 and will need next injection on 6/7 (out of town 6/8-6/11). Confirmed with managed care that PA is still valid and would be OK to have injection day early.

## 2020-07-31 NOTE — Telephone Encounter (Signed)
Patient voiced concern regarding high copay. Patient does not qualify for patient assistance due to income.  Alyson, oral chemo pharmacist, sent a message to Dr Benay Spice about cost.  Dr Benay Spice changed therapy due to [place] Patient [place] Phone [phone] Fax [phone] 07/31/2020 12:37 PM

## 2020-08-01 ENCOUNTER — Other Ambulatory Visit: Payer: Medicare Other

## 2020-08-01 ENCOUNTER — Telehealth: Payer: Self-pay | Admitting: *Deleted

## 2020-08-01 ENCOUNTER — Encounter: Payer: Self-pay | Admitting: Oncology

## 2020-08-01 ENCOUNTER — Inpatient Hospital Stay: Payer: Medicare Other | Attending: Nurse Practitioner

## 2020-08-01 ENCOUNTER — Inpatient Hospital Stay: Payer: Medicare Other

## 2020-08-01 ENCOUNTER — Other Ambulatory Visit: Payer: Self-pay

## 2020-08-01 ENCOUNTER — Ambulatory Visit: Payer: Medicare Other

## 2020-08-01 VITALS — BP 147/69 | HR 51 | Temp 97.6°F | Resp 18 | Wt 165.6 lb

## 2020-08-01 DIAGNOSIS — Z856 Personal history of leukemia: Secondary | ICD-10-CM | POA: Diagnosis not present

## 2020-08-01 DIAGNOSIS — D649 Anemia, unspecified: Secondary | ICD-10-CM | POA: Insufficient documentation

## 2020-08-01 DIAGNOSIS — D693 Immune thrombocytopenic purpura: Secondary | ICD-10-CM

## 2020-08-01 LAB — CBC WITH DIFFERENTIAL (CANCER CENTER ONLY)
Abs Immature Granulocytes: 0.08 10*3/uL — ABNORMAL HIGH (ref 0.00–0.07)
Basophils Absolute: 0.1 10*3/uL (ref 0.0–0.1)
Basophils Relative: 1 %
Eosinophils Absolute: 0.3 10*3/uL (ref 0.0–0.5)
Eosinophils Relative: 4 %
HCT: 36.2 % — ABNORMAL LOW (ref 39.0–52.0)
Hemoglobin: 11.6 g/dL — ABNORMAL LOW (ref 13.0–17.0)
Immature Granulocytes: 1 %
Lymphocytes Relative: 18 %
Lymphs Abs: 1.3 10*3/uL (ref 0.7–4.0)
MCH: 27.4 pg (ref 26.0–34.0)
MCHC: 32 g/dL (ref 30.0–36.0)
MCV: 85.6 fL (ref 80.0–100.0)
Monocytes Absolute: 1.5 10*3/uL — ABNORMAL HIGH (ref 0.1–1.0)
Monocytes Relative: 20 %
Neutro Abs: 4 10*3/uL (ref 1.7–7.7)
Neutrophils Relative %: 56 %
Platelet Count: 31 10*3/uL — ABNORMAL LOW (ref 150–400)
RBC: 4.23 MIL/uL (ref 4.22–5.81)
RDW: 16.7 % — ABNORMAL HIGH (ref 11.5–15.5)
WBC Count: 7.1 10*3/uL (ref 4.0–10.5)
nRBC: 0 % (ref 0.0–0.2)

## 2020-08-01 MED ORDER — ROMIPLOSTIM 125 MCG ~~LOC~~ SOLR
1.0000 ug/kg | Freq: Once | SUBCUTANEOUS | Status: AC
Start: 2020-08-01 — End: 2020-08-01
  Administered 2020-08-01: 75 ug via SUBCUTANEOUS
  Filled 2020-08-01: qty 0.15

## 2020-08-01 NOTE — Telephone Encounter (Signed)
Called wife to inform her that CBC is stable and to continue Nplate weekly. She expresses concern that his entire left lower arm is bruised w/a firm area that could be a hematoma. She will try to send picture via mychart. Is asking if he should take Prednisone till count improves. Worried due to them going to [place] for a week on 6/8.

## 2020-08-01 NOTE — Patient Instructions (Signed)
[facility]  Discharge Instructions: Thank you for choosing [facility] to provide your oncology and hematology care.   If you have a lab appointment with the [facility], please go directly to the [facility] and check in at the registration area.   Wear comfortable clothing and clothing appropriate for easy access to any Portacath or PICC line.   We strive to give you quality time with your provider. You may need to reschedule your appointment if you arrive late (15 or more minutes).  Arriving late affects you and other patients whose appointments are after yours.  Also, if you miss three or more appointments without notifying the office, you may be dismissed from the clinic at the provider's discretion.      For prescription refill requests, have your pharmacy contact our office and allow 72 hours for refills to be completed.    Today you received: Nplate   To help prevent nausea and vomiting after your treatment, we encourage you to take your nausea medication as directed.  BELOW ARE SYMPTOMS THAT SHOULD BE REPORTED IMMEDIATELY: . *FEVER GREATER THAN 100.4 F (38 C) OR HIGHER . *CHILLS OR SWEATING . *NAUSEA AND VOMITING THAT IS NOT CONTROLLED WITH YOUR NAUSEA MEDICATION . *UNUSUAL SHORTNESS OF BREATH . *UNUSUAL BRUISING OR BLEEDING . *URINARY PROBLEMS (pain or burning when urinating, or frequent urination) . *BOWEL PROBLEMS (unusual diarrhea, constipation, pain near the anus) . TENDERNESS IN MOUTH AND THROAT WITH OR WITHOUT PRESENCE OF ULCERS (sore throat, sores in mouth, or a toothache) . UNUSUAL RASH, SWELLING OR PAIN  . UNUSUAL VAGINAL DISCHARGE OR ITCHING   Items with * indicate a potential emergency and should be followed up as soon as possible or go to the Emergency Department if any problems should occur.  Please show the CHEMOTHERAPY ALERT CARD or IMMUNOTHERAPY ALERT CARD at check-in to the Emergency Department and triage  nurse.  Should you have questions after your visit or need to cancel or reschedule your appointment, please contact [facility]  Dept: [phone]  and follow the prompts.  Office hours are 8:00 a.m. to 4:30 p.m. Monday - Friday. Please note that voicemails left after 4:00 p.m. may not be returned until the following business day.  We are closed weekends and major holidays. You have access to a nurse at all times for urgent questions. Please call the main number to the clinic Dept: [phone] and follow the prompts.   For any non-urgent questions, you may also contact your provider using MyChart. We now offer e-Visits for anyone 59 and older to request care online for non-urgent symptoms. For details visit [email or web address].   Also download the MyChart app! Go to the app store, search "MyChart", open the app, select [facility], and log in with your MyChart username and password.  Due to Covid, a mask is required upon entering the hospital/clinic. If you do not have a mask, one will be given to you upon arrival. For doctor visits, patients may have 1 support person aged 45 or older with them. For treatment visits, patients cannot have anyone with them due to current Covid guidelines and our immunocompromised population.    Romiplostim injection What is this medicine? ROMIPLOSTIM (roe mi PLOE stim) helps your body make more platelets. This medicine is used to treat low platelets caused by chronic idiopathic thrombocytopenic purpura (ITP) or a bone marrow syndrome caused by radiation sickness. This medicine may be used for  other purposes; ask your health care provider or pharmacist if you have questions. COMMON BRAND NAME(S): Nplate What should I tell my health care provider before I take this medicine? They need to know if you have any of these conditions:  blood clots  myelodysplastic syndrome  an unusual or allergic reaction to romiplostim, mannitol, other  medicines, foods, dyes, or preservatives  pregnant or trying to get pregnant  breast-feeding How should I use this medicine? This medicine is injected under the skin. It is given by a health care provider in a hospital or clinic setting. A special MedGuide will be given to you before each treatment. Be sure to read this information carefully each time. Talk to your health care provider about the use of this medicine in children. While it may be prescribed for children as young as newborns for selected conditions, precautions do apply. Overdosage: If you think you have taken too much of this medicine contact a poison control center or emergency room at once. NOTE: This medicine is only for you. Do not share this medicine with others. What if I miss a dose? Keep appointments for follow-up doses. It is important not to miss your dose. Call your health care provider if you are unable to keep an appointment. What may interact with this medicine? Interactions are not expected. This list may not describe all possible interactions. Give your health care provider a list of all the medicines, herbs, non-prescription drugs, or dietary supplements you use. Also tell them if you smoke, drink alcohol, or use illegal drugs. Some items may interact with your medicine. What should I watch for while using this medicine? Visit your health care provider for regular checks on your progress. You may need blood work done while you are taking this medicine. Your condition will be monitored carefully while you are receiving this medicine. It is important not to miss any appointments. What side effects may I notice from receiving this medicine? Side effects that you should report to your doctor or health care professional as soon as possible:  allergic reactions (skin rash, itching or hives; swelling of the face, lips, or tongue)  bleeding (bloody or black, tarry stools; red or dark brown urine; spitting up blood or  brown material that looks like coffee grounds; red spots on the skin; unusual bruising or bleeding from the eyes, gums, or nose)  blood clot (chest pain; shortness of breath; pain, swelling, or warmth in the leg)  stroke (changes in vision; confusion; trouble speaking or understanding; severe headaches; sudden numbness or weakness of the face, arm or leg; trouble walking; dizziness; loss of balance or coordination) Side effects that usually do not require medical attention (report to your doctor or health care professional if they continue or are bothersome):  diarrhea  dizziness  headache  joint pain  muscle pain  stomach pain  trouble sleeping This list may not describe all possible side effects. Call your doctor for medical advice about side effects. You may report side effects to FDA at 1-800-FDA-1088. Where should I keep my medicine? This medicine is given in a hospital or clinic. It will not be stored at home. NOTE: This sheet is a summary. It may not cover all possible information. If you have questions about this medicine, talk to your doctor, pharmacist, or health care provider.  2021 Elsevier/Gold Standard (2019-04-04 10:28:13)

## 2020-08-02 ENCOUNTER — Inpatient Hospital Stay (HOSPITAL_BASED_OUTPATIENT_CLINIC_OR_DEPARTMENT_OTHER): Payer: Medicare Other | Admitting: Nurse Practitioner

## 2020-08-02 ENCOUNTER — Encounter: Payer: Self-pay | Admitting: Nurse Practitioner

## 2020-08-02 VITALS — BP 152/71 | HR 51 | Temp 98.1°F | Resp 16 | Wt 165.0 lb

## 2020-08-02 DIAGNOSIS — D693 Immune thrombocytopenic purpura: Secondary | ICD-10-CM | POA: Diagnosis not present

## 2020-08-02 NOTE — Progress Notes (Signed)
[facility] OFFICE PROGRESS NOTE   Diagnosis: ITP  INTERVAL HISTORY:   [name] returns prior to scheduled follow-up for evaluation of a bruise on the left forearm.  Nplate was resumed yesterday.  He continues to note easy bruising especially on the forearms with relatively mild trauma.  About a week ago he "bumped" the left forearm on a doorway.  Then on 07/30/2020 a screen door hit the left forearm.  He subsequently noted increased swelling and bruising.  He applied ice and a wrap.  He notes that the swelling and bruising have improved.  He notes a "knot" at the mid left forearm.  He denies spontaneous bleeding.  Objective:  Vital signs in last 24 hours:  Blood pressure (!) 152/71, pulse (!) 51, temperature 98.1 F (36.7 C), temperature source Temporal, resp. rate 16, weight 165 lb (74.8 kg), SpO2 97 %.    HEENT: No bleeding in the oral cavity. Resp: End inspiratory fine rales at the right posterior lung base.  No respiratory distress. Cardio: Regular rate and rhythm. GI: Abdomen soft and nontender.  No hepatosplenomegaly. Vascular: Trace edema lower leg bilaterally.  Left radial and brachial pulses intact.  Skin: Ecchymoses bilateral forearms left greater than right.  Question small hematoma left mid forearm.  Outer edges of the ecchymosis at the left forearm/elbow showing signs of resolution.  Other chronic skin change over the forearms as well.  No significant left arm edema.   Lab Results:  Lab Results  Component Value Date   WBC 7.1 08/01/2020   HGB 11.6 (L) 08/01/2020   HCT 36.2 (L) 08/01/2020   MCV 85.6 08/01/2020   PLT 31 (L) 08/01/2020   NEUTROABS 4.0 08/01/2020    Imaging:  No results found.  Medications: I have reviewed the patient's current medications.  Assessment/Plan: 1. Thrombocytopenia ? Bone marrow biopsy 07/08/2016-no evidence of B-cell lymphoma, 40% cellular bone marrow with trilineage hematopoiesis, megakaryocytes present with normal  morphology, normal cytogenetics, negative for BRAF mutation ? Flow cytometry 01/05/2009-no monoclonal B-cell or phenotypically abnormal T-cell population ? Prednisone starting 06/29/2019 ? Nplate 07/04/2019, 1/61/0960,AVWUJW 07/18/2019 ? Prednisone 20 mg daily beginning 07/25/2019 ? Platelets 11,000 on 08/15/2019, prednisone increased to 40 mg daily, Nplate resume 03/21/1476 ? Prednisone decreased to 30 mg daily 08/25/2019, Nplate continued ? Prednisone continued at 30 mg daily 09/13/2019, weekly Nplate continued ? Prednisone taper to 20 mg daily 09/23/2019, weekly Nplate continued ? Prednisone taper to 10 mg daily 09/30/2019, weekly Nplate continued ? Prednisone taper to 5 mg daily 10/21/2019, weekly Nplate continued ? Prednisone taper to 5 mg every other day for 5 doses then stop 11/11/2019, weekly Nplate continued ? Nplate last given 2/95/6213 ? Promacta cost prohibitive ? Nplate beginning 0/10/6576 2. Hairy cell leukemia 1982, status post splenectomy 3. Coronary artery disease 4. Aortic stenosis 5. 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 Macular degeneration 7. Hearing loss 8. Basal cell carcinoma left cheek 05/24/2019 9. Left upper lobe nodule, faint FDG activity on PET at [facility] 05/31/2013 10. Status post TAVR procedure 09/06/2019 11. Admission with Pseudomonas urosepsis 09/16/2019 12. Acute left MCA distribution CVA 09/16/2019 13. Covid January 2022 14. Mild anemia   Disposition: Mr. [facility] appears stable.  He presents today with increased bruising at the left forearm following trauma.  He reports the swelling and bruising are better.  He denies spontaneous bleeding.  We discussed easy bruising with relatively minor trauma is due to thrombocytopenia.  He will continue to monitor the bruise  and contact the office with progression and/or any spontaneous bleeding.  We discussed the CBC results from yesterday which showed a platelet count of 31,000, consistent with recent prior platelet  counts.  Nplate was resumed yesterday.  He will return for lab and Nplate as scheduled on 08/07/2020 but understands to contact the office prior to that appointment as outlined above or with any other problems.    [name] ANP/GNP-BC   08/02/2020  1:50 PM

## 2020-08-04 ENCOUNTER — Other Ambulatory Visit: Payer: Self-pay | Admitting: Interventional Cardiology

## 2020-08-06 ENCOUNTER — Other Ambulatory Visit: Payer: Medicare Other

## 2020-08-07 ENCOUNTER — Inpatient Hospital Stay: Payer: Medicare Other

## 2020-08-07 ENCOUNTER — Other Ambulatory Visit: Payer: Self-pay

## 2020-08-07 VITALS — BP 153/71 | HR 52 | Temp 97.9°F | Resp 18

## 2020-08-07 DIAGNOSIS — D693 Immune thrombocytopenic purpura: Secondary | ICD-10-CM | POA: Diagnosis not present

## 2020-08-07 DIAGNOSIS — D649 Anemia, unspecified: Secondary | ICD-10-CM | POA: Diagnosis not present

## 2020-08-07 DIAGNOSIS — Z856 Personal history of leukemia: Secondary | ICD-10-CM | POA: Diagnosis not present

## 2020-08-07 LAB — CBC WITH DIFFERENTIAL (CANCER CENTER ONLY)
Abs Immature Granulocytes: 0.1 10*3/uL — ABNORMAL HIGH (ref 0.00–0.07)
Basophils Absolute: 0.5 10*3/uL — ABNORMAL HIGH (ref 0.0–0.1)
Basophils Relative: 6 %
Eosinophils Absolute: 0.5 10*3/uL (ref 0.0–0.5)
Eosinophils Relative: 6 %
HCT: 38.6 % — ABNORMAL LOW (ref 39.0–52.0)
Hemoglobin: 12.3 g/dL — ABNORMAL LOW (ref 13.0–17.0)
Lymphocytes Relative: 20 %
Lymphs Abs: 1.6 10*3/uL (ref 0.7–4.0)
MCH: 27.4 pg (ref 26.0–34.0)
MCHC: 31.9 g/dL (ref 30.0–36.0)
MCV: 86 fL (ref 80.0–100.0)
Monocytes Absolute: 1 10*3/uL (ref 0.1–1.0)
Monocytes Relative: 13 %
Myelocytes: 1 %
Neutro Abs: 4.2 10*3/uL (ref 1.7–7.7)
Neutrophils Relative %: 54 %
Platelet Count: 50 10*3/uL — ABNORMAL LOW (ref 150–400)
RBC: 4.49 MIL/uL (ref 4.22–5.81)
RDW: 17 % — ABNORMAL HIGH (ref 11.5–15.5)
WBC Count: 7.8 10*3/uL (ref 4.0–10.5)
nRBC: 0 % (ref 0.0–0.2)

## 2020-08-07 LAB — CMP (CANCER CENTER ONLY)
ALT: 14 U/L (ref 0–44)
AST: 19 U/L (ref 15–41)
Albumin: 3.9 g/dL (ref 3.5–5.0)
Alkaline Phosphatase: 107 U/L (ref 38–126)
Anion gap: 9 (ref 5–15)
BUN: 19 mg/dL (ref 8–23)
CO2: 25 mmol/L (ref 22–32)
Calcium: 8.6 mg/dL — ABNORMAL LOW (ref 8.9–10.3)
Chloride: 107 mmol/L (ref 98–111)
Creatinine: 1.14 mg/dL (ref 0.61–1.24)
GFR, Estimated: 60 mL/min — ABNORMAL LOW (ref >60)
Glucose, Bld: 91 mg/dL (ref 70–99)
Potassium: 4 mmol/L (ref 3.5–5.1)
Sodium: 141 mmol/L (ref 135–145)
Total Bilirubin: 0.6 mg/dL (ref 0.3–1.2)
Total Protein: 6.4 g/dL — ABNORMAL LOW (ref 6.5–8.1)

## 2020-08-07 MED ORDER — ROMIPLOSTIM 125 MCG ~~LOC~~ SOLR
1.0000 ug/kg | Freq: Once | SUBCUTANEOUS | Status: AC
  Administered 2020-08-07: 75 ug via SUBCUTANEOUS
  Filled 2020-08-07: qty 0.15

## 2020-08-07 NOTE — Patient Instructions (Signed)
[name]  Discharge Instructions: Thank you for choosing [facility] to provide your oncology and hematology care.   If you have a lab appointment with the [facility], please go directly to the [facility] and check in at the registration area.   Wear comfortable clothing and clothing appropriate for easy access to any Portacath or PICC line.   We strive to give you quality time with your provider. You may need to reschedule your appointment if you arrive late (15 or more minutes).  Arriving late affects you and other patients whose appointments are after yours.  Also, if you miss three or more appointments without notifying the office, you may be dismissed from the clinic at the provider's discretion.      For prescription refill requests, have your pharmacy contact our office and allow 72 hours for refills to be completed.    Today you received: Nplate   To help prevent nausea and vomiting after your treatment, we encourage you to take your nausea medication as directed.  BELOW ARE SYMPTOMS THAT SHOULD BE REPORTED IMMEDIATELY: . *FEVER GREATER THAN 100.4 F (38 C) OR HIGHER . *CHILLS OR SWEATING . *NAUSEA AND VOMITING THAT IS NOT CONTROLLED WITH YOUR NAUSEA MEDICATION . *UNUSUAL SHORTNESS OF BREATH . *UNUSUAL BRUISING OR BLEEDING . *URINARY PROBLEMS (pain or burning when urinating, or frequent urination) . *BOWEL PROBLEMS (unusual diarrhea, constipation, pain near the anus) . TENDERNESS IN MOUTH AND THROAT WITH OR WITHOUT PRESENCE OF ULCERS (sore throat, sores in mouth, or a toothache) . UNUSUAL RASH, SWELLING OR PAIN  . UNUSUAL VAGINAL DISCHARGE OR ITCHING   Items with * indicate a potential emergency and should be followed up as soon as possible or go to the Emergency Department if any problems should occur.  Please show the CHEMOTHERAPY ALERT CARD or IMMUNOTHERAPY ALERT CARD at check-in to the Emergency Department and triage  nurse.  Should you have questions after your visit or need to cancel or reschedule your appointment, please contact [facility]  Dept: [phone]  and follow the prompts.  Office hours are 8:00 a.m. to 4:30 p.m. Monday - Friday. Please note that voicemails left after 4:00 p.m. may not be returned until the following business day.  We are closed weekends and major holidays. You have access to a nurse at all times for urgent questions. Please call the main number to the clinic Dept: [phone] and follow the prompts.   For any non-urgent questions, you may also contact your provider using MyChart. We now offer e-Visits for anyone 98 and older to request care online for non-urgent symptoms. For details visit [email or web address].   Also download the MyChart app! Go to the app store, search "MyChart", open the app, select [facility], and log in with your MyChart username and password.  Due to Covid, a mask is required upon entering the hospital/clinic. If you do not have a mask, one will be given to you upon arrival. For doctor visits, patients may have 1 support person aged 39 or older with them. For treatment visits, patients cannot have anyone with them due to current Covid guidelines and our immunocompromised population.    Romiplostim injection What is this medicine? ROMIPLOSTIM (roe mi PLOE stim) helps your body make more platelets. This medicine is used to treat low platelets caused by chronic idiopathic thrombocytopenic purpura (ITP) or a bone marrow syndrome caused by radiation sickness. This medicine may be used for  other purposes; ask your health care provider or pharmacist if you have questions. COMMON BRAND NAME(S): Nplate What should I tell my health care provider before I take this medicine? They need to know if you have any of these conditions:  blood clots  myelodysplastic syndrome  an unusual or allergic reaction to romiplostim, mannitol, other  medicines, foods, dyes, or preservatives  pregnant or trying to get pregnant  breast-feeding How should I use this medicine? This medicine is injected under the skin. It is given by a health care provider in a hospital or clinic setting. A special MedGuide will be given to you before each treatment. Be sure to read this information carefully each time. Talk to your health care provider about the use of this medicine in children. While it may be prescribed for children as young as newborns for selected conditions, precautions do apply. Overdosage: If you think you have taken too much of this medicine contact a poison control center or emergency room at once. NOTE: This medicine is only for you. Do not share this medicine with others. What if I miss a dose? Keep appointments for follow-up doses. It is important not to miss your dose. Call your health care provider if you are unable to keep an appointment. What may interact with this medicine? Interactions are not expected. This list may not describe all possible interactions. Give your health care provider a list of all the medicines, herbs, non-prescription drugs, or dietary supplements you use. Also tell them if you smoke, drink alcohol, or use illegal drugs. Some items may interact with your medicine. What should I watch for while using this medicine? Visit your health care provider for regular checks on your progress. You may need blood work done while you are taking this medicine. Your condition will be monitored carefully while you are receiving this medicine. It is important not to miss any appointments. What side effects may I notice from receiving this medicine? Side effects that you should report to your doctor or health care professional as soon as possible:  allergic reactions (skin rash, itching or hives; swelling of the face, lips, or tongue)  bleeding (bloody or black, tarry stools; red or dark brown urine; spitting up blood or  brown material that looks like coffee grounds; red spots on the skin; unusual bruising or bleeding from the eyes, gums, or nose)  blood clot (chest pain; shortness of breath; pain, swelling, or warmth in the leg)  stroke (changes in vision; confusion; trouble speaking or understanding; severe headaches; sudden numbness or weakness of the face, arm or leg; trouble walking; dizziness; loss of balance or coordination) Side effects that usually do not require medical attention (report to your doctor or health care professional if they continue or are bothersome):  diarrhea  dizziness  headache  joint pain  muscle pain  stomach pain  trouble sleeping This list may not describe all possible side effects. Call your doctor for medical advice about side effects. You may report side effects to FDA at 1-800-FDA-1088. Where should I keep my medicine? This medicine is given in a hospital or clinic. It will not be stored at home. NOTE: This sheet is a summary. It may not cover all possible information. If you have questions about this medicine, talk to your doctor, pharmacist, or health care provider.  2021 Elsevier/Gold Standard (2019-04-04 10:28:13)

## 2020-08-13 ENCOUNTER — Other Ambulatory Visit: Payer: Medicare Other

## 2020-08-13 ENCOUNTER — Ambulatory Visit: Payer: Medicare Other

## 2020-08-13 ENCOUNTER — Telehealth: Payer: Self-pay | Admitting: *Deleted

## 2020-08-13 ENCOUNTER — Inpatient Hospital Stay: Payer: Medicare Other

## 2020-08-13 ENCOUNTER — Other Ambulatory Visit: Payer: Self-pay

## 2020-08-13 VITALS — BP 131/69 | HR 53 | Temp 97.9°F | Resp 18

## 2020-08-13 DIAGNOSIS — D693 Immune thrombocytopenic purpura: Secondary | ICD-10-CM

## 2020-08-13 DIAGNOSIS — D649 Anemia, unspecified: Secondary | ICD-10-CM | POA: Diagnosis not present

## 2020-08-13 DIAGNOSIS — Z856 Personal history of leukemia: Secondary | ICD-10-CM | POA: Diagnosis not present

## 2020-08-13 LAB — CBC WITH DIFFERENTIAL (CANCER CENTER ONLY)
Abs Immature Granulocytes: 0.09 10*3/uL — ABNORMAL HIGH (ref 0.00–0.07)
Basophils Absolute: 0.1 10*3/uL (ref 0.0–0.1)
Basophils Relative: 2 %
Eosinophils Absolute: 0.3 10*3/uL (ref 0.0–0.5)
Eosinophils Relative: 4 %
HCT: 37.2 % — ABNORMAL LOW (ref 39.0–52.0)
Hemoglobin: 11.9 g/dL — ABNORMAL LOW (ref 13.0–17.0)
Immature Granulocytes: 1 %
Lymphocytes Relative: 22 %
Lymphs Abs: 1.6 10*3/uL (ref 0.7–4.0)
MCH: 27.5 pg (ref 26.0–34.0)
MCHC: 32 g/dL (ref 30.0–36.0)
MCV: 86.1 fL (ref 80.0–100.0)
Monocytes Absolute: 1.5 10*3/uL — ABNORMAL HIGH (ref 0.1–1.0)
Monocytes Relative: 21 %
Neutro Abs: 3.8 10*3/uL (ref 1.7–7.7)
Neutrophils Relative %: 50 %
Platelet Count: 89 10*3/uL — ABNORMAL LOW (ref 150–400)
RBC: 4.32 MIL/uL (ref 4.22–5.81)
RDW: 16.9 % — ABNORMAL HIGH (ref 11.5–15.5)
WBC Count: 7.5 10*3/uL (ref 4.0–10.5)
nRBC: 0 % (ref 0.0–0.2)

## 2020-08-13 MED ORDER — ROMIPLOSTIM 125 MCG ~~LOC~~ SOLR
1.0000 ug/kg | Freq: Once | SUBCUTANEOUS | Status: AC
Start: 2020-08-13 — End: 2020-08-13
  Administered 2020-08-13: 75 ug via SUBCUTANEOUS
  Filled 2020-08-13: qty 0.15

## 2020-08-13 NOTE — Telephone Encounter (Signed)
-----   Message from Ladell Pier, MD sent at 08/13/2020  1:01 PM EDT ----- Please call patient, platelet count is better, continue nplate,f/u as scheduled

## 2020-08-13 NOTE — Patient Instructions (Signed)
Romiplostim injection What is this medication? ROMIPLOSTIM (roe mi PLOE stim) helps your body make more platelets. This medicine is used to treat low platelets caused by chronic idiopathic thrombocytopenic purpura (ITP) or a bone marrow syndrome caused by radiation sickness. This medicine may be used for other purposes; ask your health care provider or pharmacist if you have questions. COMMON BRAND NAME(S): Nplate What should I tell my care team before I take this medication? They need to know if you have any of these conditions: blood clots myelodysplastic syndrome an unusual or allergic reaction to romiplostim, mannitol, other medicines, foods, dyes, or preservatives pregnant or trying to get pregnant breast-feeding How should I use this medication? This medicine is injected under the skin. It is given by a health care provider in a hospital or clinic setting. A special MedGuide will be given to you before each treatment. Be sure to read this information carefully each time. Talk to your health care provider about the use of this medicine in children. While it may be prescribed for children as young as newborns for selected conditions, precautions do apply. Overdosage: If you think you have taken too much of this medicine contact a poison control center or emergency room at once. NOTE: This medicine is only for you. Do not share this medicine with others. What if I miss a dose? Keep appointments for follow-up doses. It is important not to miss your dose. Call your health care provider if you are unable to keep an appointment. What may interact with this medication? Interactions are not expected. This list may not describe all possible interactions. Give your health care provider a list of all the medicines, herbs, non-prescription drugs, or dietary supplements you use. Also tell them if you smoke, drink alcohol, or use illegal drugs. Some items may interact with your medicine. What should I  watch for while using this medication? Visit your health care provider for regular checks on your progress. You may need blood work done while you are taking this medicine. Your condition will be monitored carefully while you are receiving this medicine. It is important not to miss any appointments. What side effects may I notice from receiving this medication? Side effects that you should report to your doctor or health care professional as soon as possible: allergic reactions (skin rash, itching or hives; swelling of the face, lips, or tongue) bleeding (bloody or black, tarry stools; red or dark brown urine; spitting up blood or brown material that looks like coffee grounds; red spots on the skin; unusual bruising or bleeding from the eyes, gums, or nose) blood clot (chest pain; shortness of breath; pain, swelling, or warmth in the leg) stroke (changes in vision; confusion; trouble speaking or understanding; severe headaches; sudden numbness or weakness of the face, arm or leg; trouble walking; dizziness; loss of balance or coordination) Side effects that usually do not require medical attention (report to your doctor or health care professional if they continue or are bothersome): diarrhea dizziness headache joint pain muscle pain stomach pain trouble sleeping This list may not describe all possible side effects. Call your doctor for medical advice about side effects. You may report side effects to FDA at 1-800-FDA-1088. Where should I keep my medication? This medicine is given in a hospital or clinic. It will not be stored at home. NOTE: This sheet is a summary. It may not cover all possible information. If you have questions about this medicine, talk to your doctor, pharmacist, or health care provider.    2022 Elsevier/Gold Standard (2019-04-04 10:28:13)  

## 2020-08-13 NOTE — Telephone Encounter (Signed)
Left VM for wife that platelet count is up to 89,000. Continue weekly Nplate as scheduled.

## 2020-08-14 ENCOUNTER — Ambulatory Visit: Payer: Medicare Other

## 2020-08-14 ENCOUNTER — Other Ambulatory Visit: Payer: Medicare Other

## 2020-08-17 DIAGNOSIS — H401431 Capsular glaucoma with pseudoexfoliation of lens, bilateral, mild stage: Secondary | ICD-10-CM | POA: Diagnosis not present

## 2020-08-20 ENCOUNTER — Other Ambulatory Visit: Payer: Medicare Other

## 2020-08-20 ENCOUNTER — Inpatient Hospital Stay: Payer: Medicare Other

## 2020-08-20 ENCOUNTER — Other Ambulatory Visit: Payer: Self-pay

## 2020-08-20 ENCOUNTER — Inpatient Hospital Stay (HOSPITAL_BASED_OUTPATIENT_CLINIC_OR_DEPARTMENT_OTHER): Payer: Medicare Other | Admitting: Oncology

## 2020-08-20 VITALS — BP 134/70 | HR 57 | Temp 98.1°F | Resp 20 | Ht 64.0 in | Wt 165.2 lb

## 2020-08-20 DIAGNOSIS — D693 Immune thrombocytopenic purpura: Secondary | ICD-10-CM

## 2020-08-20 DIAGNOSIS — Z856 Personal history of leukemia: Secondary | ICD-10-CM | POA: Diagnosis not present

## 2020-08-20 DIAGNOSIS — D649 Anemia, unspecified: Secondary | ICD-10-CM | POA: Diagnosis not present

## 2020-08-20 LAB — CBC WITH DIFFERENTIAL (CANCER CENTER ONLY)
Abs Immature Granulocytes: 0.1 10*3/uL — ABNORMAL HIGH (ref 0.00–0.07)
Basophils Absolute: 0.1 10*3/uL (ref 0.0–0.1)
Basophils Relative: 2 %
Eosinophils Absolute: 0.2 10*3/uL (ref 0.0–0.5)
Eosinophils Relative: 3 %
HCT: 38 % — ABNORMAL LOW (ref 39.0–52.0)
Hemoglobin: 12.2 g/dL — ABNORMAL LOW (ref 13.0–17.0)
Immature Granulocytes: 1 %
Lymphocytes Relative: 20 %
Lymphs Abs: 1.5 10*3/uL (ref 0.7–4.0)
MCH: 27.8 pg (ref 26.0–34.0)
MCHC: 32.1 g/dL (ref 30.0–36.0)
MCV: 86.6 fL (ref 80.0–100.0)
Monocytes Absolute: 1.7 10*3/uL — ABNORMAL HIGH (ref 0.1–1.0)
Monocytes Relative: 23 %
Neutro Abs: 3.8 10*3/uL (ref 1.7–7.7)
Neutrophils Relative %: 51 %
Platelet Count: 102 10*3/uL — ABNORMAL LOW (ref 150–400)
RBC: 4.39 MIL/uL (ref 4.22–5.81)
RDW: 16.8 % — ABNORMAL HIGH (ref 11.5–15.5)
WBC Count: 7.6 10*3/uL (ref 4.0–10.5)
nRBC: 0 % (ref 0.0–0.2)

## 2020-08-20 LAB — CMP (CANCER CENTER ONLY)
ALT: 14 U/L (ref 0–44)
AST: 18 U/L (ref 15–41)
Albumin: 3.9 g/dL (ref 3.5–5.0)
Alkaline Phosphatase: 106 U/L (ref 38–126)
Anion gap: 8 (ref 5–15)
BUN: 27 mg/dL — ABNORMAL HIGH (ref 8–23)
CO2: 25 mmol/L (ref 22–32)
Calcium: 9.1 mg/dL (ref 8.9–10.3)
Chloride: 108 mmol/L (ref 98–111)
Creatinine: 1.27 mg/dL — ABNORMAL HIGH (ref 0.61–1.24)
GFR, Estimated: 52 mL/min — ABNORMAL LOW (ref >60)
Glucose, Bld: 113 mg/dL — ABNORMAL HIGH (ref 70–99)
Potassium: 4.2 mmol/L (ref 3.5–5.1)
Sodium: 141 mmol/L (ref 135–145)
Total Bilirubin: 0.4 mg/dL (ref 0.3–1.2)
Total Protein: 6.5 g/dL (ref 6.5–8.1)

## 2020-08-20 MED ORDER — ROMIPLOSTIM 125 MCG ~~LOC~~ SOLR
1.0000 ug/kg | Freq: Once | SUBCUTANEOUS | Status: AC
Start: 2020-08-20 — End: 2020-08-20
  Administered 2020-08-20: 75 ug via SUBCUTANEOUS
  Filled 2020-08-20: qty 0.15

## 2020-08-20 NOTE — Patient Instructions (Addendum)
[name]  Discharge Instructions: Thank you for choosing [facility] to provide your oncology and hematology care.   If you have a lab appointment with the [facility], please go directly to the [facility] and check in at the registration area.   Wear comfortable clothing and clothing appropriate for easy access to any Portacath or PICC line.   We strive to give you quality time with your provider. You may need to reschedule your appointment if you arrive late (15 or more minutes).  Arriving late affects you and other patients whose appointments are after yours.  Also, if you miss three or more appointments without notifying the office, you may be dismissed from the clinic at the provider's discretion.      For prescription refill requests, have your pharmacy contact our office and allow 72 hours for refills to be completed.    Today you received the following chemotherapy and/or immunotherapy agents Nplate      To help prevent nausea and vomiting after your treatment, we encourage you to take your nausea medication as directed.  BELOW ARE SYMPTOMS THAT SHOULD BE REPORTED IMMEDIATELY: *FEVER GREATER THAN 100.4 F (38 C) OR HIGHER *CHILLS OR SWEATING *NAUSEA AND VOMITING THAT IS NOT CONTROLLED WITH YOUR NAUSEA MEDICATION *UNUSUAL SHORTNESS OF BREATH *UNUSUAL BRUISING OR BLEEDING *URINARY PROBLEMS (pain or burning when urinating, or frequent urination) *BOWEL PROBLEMS (unusual diarrhea, constipation, pain near the anus) TENDERNESS IN MOUTH AND THROAT WITH OR WITHOUT PRESENCE OF ULCERS (sore throat, sores in mouth, or a toothache) UNUSUAL RASH, SWELLING OR PAIN  UNUSUAL VAGINAL DISCHARGE OR ITCHING   Items with * indicate a potential emergency and should be followed up as soon as possible or go to the Emergency Department if any problems should occur.  Please show the CHEMOTHERAPY ALERT CARD or IMMUNOTHERAPY ALERT CARD at check-in to the  Emergency Department and triage nurse.  Should you have questions after your visit or need to cancel or reschedule your appointment, please contact [facility]  Dept: [phone]  and follow the prompts.  Office hours are 8:00 a.m. to 4:30 p.m. Monday - Friday. Please note that voicemails left after 4:00 p.m. may not be returned until the following business day.  We are closed weekends and major holidays. You have access to a nurse at all times for urgent questions. Please call the main number to the clinic Dept: [phone] and follow the prompts.   For any non-urgent questions, you may also contact your provider using MyChart. We now offer e-Visits for anyone 65 and older to request care online for non-urgent symptoms. For details visit [email or web address].   Also download the MyChart app! Go to the app store, search "MyChart", open the app, select [facility], and log in with your MyChart username and password.  Due to Covid, a mask is required upon entering the hospital/clinic. If you do not have a mask, one will be given to you upon arrival. For doctor visits, patients may have 1 support person aged 70 or older with them. For treatment visits, patients cannot have anyone with them due to current Covid guidelines and our immunocompromised population.  Romiplostim injection What is this medication? ROMIPLOSTIM (roe mi PLOE stim) helps your body make more platelets. This medicine is used to treat low platelets caused by chronic idiopathic thrombocytopenic purpura (ITP) or a bone marrow syndrome caused by radiationsickness. This medicine may be used for other purposes; ask your  health care provider orpharmacist if you have questions. COMMON BRAND NAME(S): Nplate What should I tell my care team before I take this medication? They need to know if you have any of these conditions: blood clots myelodysplastic syndrome an unusual or allergic reaction to romiplostim,  mannitol, other medicines, foods, dyes, or preservatives pregnant or trying to get pregnant breast-feeding How should I use this medication? This medicine is injected under the skin. It is given by a health care providerin a hospital or clinic setting. A special MedGuide will be given to you before each treatment. Be sure to readthis information carefully each time. Talk to your health care provider about the use of this medicine in children. While it may be prescribed for children as young as newborns for selectedconditions, precautions do apply. Overdosage: If you think you have taken too much of this medicine contact apoison control center or emergency room at once. NOTE: This medicine is only for you. Do not share this medicine with others. What if I miss a dose? Keep appointments for follow-up doses. It is important not to miss your dose.Call your health care provider if you are unable to keep an appointment. What may interact with this medication? Interactions are not expected. This list may not describe all possible interactions. Give your health care provider a list of all the medicines, herbs, non-prescription drugs, or dietary supplements you use. Also tell them if you smoke, drink alcohol, or use illegaldrugs. Some items may interact with your medicine. What should I watch for while using this medication? Visit your health care provider for regular checks on your progress. You may need blood work done while you are taking this medicine. Your condition will be monitored carefully while you are receiving this medicine. It is important notto miss any appointments. What side effects may I notice from receiving this medication? Side effects that you should report to your doctor or health care professionalas soon as possible: allergic reactions (skin rash, itching or hives; swelling of the face, lips, or tongue) bleeding (bloody or black, tarry stools; red or dark brown urine; spitting up  blood or brown material that looks like coffee grounds; red spots on the skin; unusual bruising or bleeding from the eyes, gums, or nose) blood clot (chest pain; shortness of breath; pain, swelling, or warmth in the leg) stroke (changes in vision; confusion; trouble speaking or understanding; severe headaches; sudden numbness or weakness of the face, arm or leg; trouble walking; dizziness; loss of balance or coordination) Side effects that usually do not require medical attention (report to yourdoctor or health care professional if they continue or are bothersome): diarrhea dizziness headache joint pain muscle pain stomach pain trouble sleeping This list may not describe all possible side effects. Call your doctor for medical advice about side effects. You may report side effects to FDA at1-800-FDA-1088. Where should I keep my medication? This medicine is given in a hospital or clinic. It will not be stored at home. NOTE: This sheet is a summary. It may not cover all possible information. If you have questions about this medicine, talk to your doctor, pharmacist, orhealth care provider.  2022 Elsevier/Gold Standard (2019-04-04 10:28:13)

## 2020-08-20 NOTE — Progress Notes (Signed)
[facility] OFFICE PROGRESS NOTE   Diagnosis: ITP, history of hairy cell leukemia  INTERVAL HISTORY:   [name] resumed weekly Nplate on 7/0/3500.  His insurance co-pay for Promacta was high.  No bleeding.  No specific complaint.  He plans to leave for the beach tomorrow.  Objective:  Vital signs in last 24 hours:  Blood pressure 134/70, pulse (!) 57, temperature 98.1 F (36.7 C), temperature source Oral, resp. rate 20, height '5\' 4"'  (1.626 m), weight 165 lb 3.2 oz (74.9 kg), SpO2 99 %.    HEENT: Ulcer at the right buccal mucosa, no thrush or bleeding Resp: Lungs clear bilaterally Cardio: Regular rate and rhythm GI: No hepatomegaly Vascular: No leg edema Skin: Changes of senile purpura at the dorsum of the arms bilaterally  Lab Results:  Lab Results  Component Value Date   WBC 7.5 08/13/2020   HGB 11.9 (L) 08/13/2020   HCT 37.2 (L) 08/13/2020   MCV 86.1 08/13/2020   PLT 89 (L) 08/13/2020   NEUTROABS 3.8 08/13/2020    CMP  Lab Results  Component Value Date   NA 141 08/07/2020   K 4.0 08/07/2020   CL 107 08/07/2020   CO2 25 08/07/2020   GLUCOSE 91 08/07/2020   BUN 19 08/07/2020   CREATININE 1.14 08/07/2020   CALCIUM 8.6 (L) 08/07/2020   PROT 6.4 (L) 08/07/2020   ALBUMIN 3.9 08/07/2020   AST 19 08/07/2020   ALT 14 08/07/2020   ALKPHOS 107 08/07/2020   BILITOT 0.6 08/07/2020   GFRNONAA 60 (L) 08/07/2020   GFRAA 59 (L) 10/31/2019    No results found for: CEA1  Lab Results  Component Value Date   INR 1.1 09/16/2019    Imaging:  No results found.  Medications: I have reviewed the patient's current medications.   Assessment/Plan: Thrombocytopenia Bone marrow biopsy 07/08/2016-no evidence of B-cell lymphoma, 40% cellular bone marrow with trilineage hematopoiesis, megakaryocytes present with normal morphology, normal cytogenetics, negative for BRAF mutation Flow cytometry 01/05/2009-no monoclonal B-cell or phenotypically abnormal T-cell  population Prednisone starting 06/29/2019 Nplate 07/04/2019, 9/38/1829, Nplate 07/18/2019 Prednisone 20 mg daily beginning 07/25/2019 Platelets 11,000 on 08/15/2019, prednisone increased to 40 mg daily, Nplate resume 9/37/1696  Prednisone decreased to 30 mg daily 08/25/2019, Nplate continued Prednisone continued at 30 mg daily 09/13/2019, weekly Nplate continued Prednisone taper to 20 mg daily 09/23/2019, weekly Nplate continued Prednisone taper to 10 mg daily 09/30/2019, weekly Nplate continued Prednisone taper to 5 mg daily 10/21/2019, weekly Nplate continued Prednisone taper to 5 mg every other day for 5 doses then stop 11/11/2019, weekly Nplate continued Nplate last given 7/89/3810 Promacta cost prohibitive Nplate beginning 03/09/5100, held 08/27/2020 and 09/03/2020 due to patient vacation Hairy cell leukemia 1982, status post splenectomy Coronary artery disease Aortic stenosis 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  He is tolerating the Nplate well.  The platelet count has responded.  He will complete another treatment with Nplate today.  He plans a trip to the beach beginning tomorrow through 09/09/2020.  He does not wish to return for Nplate while at the beach.  He will call for bleeding or bruising.  [name] will return for an  office visit and Nplate on 7/35/3299.  Douglas Coder, MD  08/20/2020  10:15 AM

## 2020-08-21 DIAGNOSIS — H353221 Exudative age-related macular degeneration, left eye, with active choroidal neovascularization: Secondary | ICD-10-CM | POA: Diagnosis not present

## 2020-08-21 DIAGNOSIS — H6121 Impacted cerumen, right ear: Secondary | ICD-10-CM | POA: Diagnosis not present

## 2020-08-21 DIAGNOSIS — H9201 Otalgia, right ear: Secondary | ICD-10-CM | POA: Diagnosis not present

## 2020-09-10 ENCOUNTER — Other Ambulatory Visit: Payer: Self-pay

## 2020-09-10 ENCOUNTER — Inpatient Hospital Stay (HOSPITAL_BASED_OUTPATIENT_CLINIC_OR_DEPARTMENT_OTHER): Payer: Medicare Other | Admitting: Nurse Practitioner

## 2020-09-10 ENCOUNTER — Inpatient Hospital Stay: Payer: Medicare Other | Attending: Nurse Practitioner

## 2020-09-10 ENCOUNTER — Inpatient Hospital Stay: Payer: Medicare Other

## 2020-09-10 ENCOUNTER — Encounter: Payer: Self-pay | Admitting: Nurse Practitioner

## 2020-09-10 VITALS — BP 150/78 | HR 60 | Temp 97.8°F | Resp 18 | Ht 64.0 in | Wt 164.2 lb

## 2020-09-10 DIAGNOSIS — D693 Immune thrombocytopenic purpura: Secondary | ICD-10-CM | POA: Diagnosis not present

## 2020-09-10 LAB — CBC WITH DIFFERENTIAL (CANCER CENTER ONLY)
Abs Immature Granulocytes: 0.09 10*3/uL — ABNORMAL HIGH (ref 0.00–0.07)
Basophils Absolute: 0.2 10*3/uL — ABNORMAL HIGH (ref 0.0–0.1)
Basophils Relative: 2 %
Eosinophils Absolute: 0.2 10*3/uL (ref 0.0–0.5)
Eosinophils Relative: 3 %
HCT: 36.5 % — ABNORMAL LOW (ref 39.0–52.0)
Hemoglobin: 11.8 g/dL — ABNORMAL LOW (ref 13.0–17.0)
Immature Granulocytes: 1 %
Lymphocytes Relative: 19 %
Lymphs Abs: 1.4 10*3/uL (ref 0.7–4.0)
MCH: 27.6 pg (ref 26.0–34.0)
MCHC: 32.3 g/dL (ref 30.0–36.0)
MCV: 85.5 fL (ref 80.0–100.0)
Monocytes Absolute: 1.3 10*3/uL — ABNORMAL HIGH (ref 0.1–1.0)
Monocytes Relative: 17 %
Neutro Abs: 4.3 10*3/uL (ref 1.7–7.7)
Neutrophils Relative %: 58 %
Platelet Count: 27 10*3/uL — ABNORMAL LOW (ref 150–400)
RBC: 4.27 MIL/uL (ref 4.22–5.81)
RDW: 16.9 % — ABNORMAL HIGH (ref 11.5–15.5)
WBC Count: 7.4 10*3/uL (ref 4.0–10.5)
nRBC: 0 % (ref 0.0–0.2)

## 2020-09-10 MED ORDER — ROMIPLOSTIM 250 MCG ~~LOC~~ SOLR
2.0000 ug/kg | Freq: Once | SUBCUTANEOUS | Status: AC
Start: 2020-09-10 — End: 2020-09-10
  Administered 2020-09-10: 150 ug via SUBCUTANEOUS
  Filled 2020-09-10: qty 0.3

## 2020-09-10 NOTE — Progress Notes (Signed)
  [facility] OFFICE PROGRESS NOTE   Diagnosis: ITP, history of hairy cell leukemia  INTERVAL HISTORY:   [name] returns as scheduled.  He last had Nplate 08/20/2020.  After that injection he went on vacation and did not wish to return for weekly Nplate.  He denies significant bruising.  No bleeding.  No change in baseline dyspnea.  Objective:  Vital signs in last 24 hours:  Blood pressure (!) 150/78, pulse 60, temperature 97.8 F (36.6 C), temperature source Oral, resp. rate 18, height _0  (1.626 m), weight 164 lb 3.2 oz (74.5 kg), SpO2 98 %.    HEENT: No bleeding within the oral cavity. Resp: Lungs clear bilaterally. Cardio: Regular rate and rhythm. GI: Abdomen soft and nontender.  No hepatosplenomegaly. Vascular: No leg edema. Skin: Changes of senile purpura bilateral forearms.   Lab Results:  Lab Results  Component Value Date   WBC 7.4 09/10/2020   HGB 11.8 (L) 09/10/2020   HCT 36.5 (L) 09/10/2020   MCV 85.5 09/10/2020   PLT 27 (L) 09/10/2020   NEUTROABS 4.3 09/10/2020    Imaging:  No results found.  Medications: I have reviewed the patient's current medications.  Assessment/Plan: Thrombocytopenia Bone marrow biopsy 07/08/2016-no evidence of B-cell lymphoma, 40% cellular bone marrow with trilineage hematopoiesis, megakaryocytes present with normal morphology, normal cytogenetics, negative for BRAF mutation Flow cytometry 01/05/2009-no monoclonal B-cell or phenotypically abnormal T-cell population Prednisone starting 06/29/2019 Nplate 07/04/2019, 5/97/4163, Nplate 07/18/2019 Prednisone 20 mg daily beginning 07/25/2019 Platelets 11,000 on 08/15/2019, prednisone increased to 40 mg daily, Nplate resume 8/45/3646  Prednisone decreased to 30 mg daily 08/25/2019, Nplate continued Prednisone continued at 30 mg daily 09/13/2019, weekly Nplate continued Prednisone taper to 20 mg daily 09/23/2019, weekly Nplate continued Prednisone taper to 10 mg daily 09/30/2019,  weekly Nplate continued Prednisone taper to 5 mg daily 10/21/2019, weekly Nplate continued Prednisone taper to 5 mg every other day for 5 doses then stop 11/11/2019, weekly Nplate continued Nplate last given 10/03/2120 Promacta cost prohibitive Nplate beginning 06/08/2498, held 08/27/2020 and 09/03/2020 due to patient vacation Nplate resumed 3/70/4888 Hairy cell leukemia 1982, status post splenectomy Coronary artery disease Aortic stenosis 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  He last had an Nplate injection 11/16/9448.  He was out of town for the next 2 injections.  CBC from today shows platelet count 27,000.  Plan to resume weekly Nplate.  He agrees with this plan.  We will see him in follow-up in 4 weeks.  He will contact the office in the interim with any problems.    [name] ANP/GNP-BC   09/10/2020  1:42 PM

## 2020-09-10 NOTE — Patient Instructions (Signed)
Romiplostim injection What is this medication? ROMIPLOSTIM (roe mi PLOE stim) helps your body make more platelets. This medicine is used to treat low platelets caused by chronic idiopathic thrombocytopenic purpura (ITP) or a bone marrow syndrome caused by radiation sickness. This medicine may be used for other purposes; ask your health care provider or pharmacist if you have questions. COMMON BRAND NAME(S): Nplate What should I tell my care team before I take this medication? They need to know if you have any of these conditions: blood clots myelodysplastic syndrome an unusual or allergic reaction to romiplostim, mannitol, other medicines, foods, dyes, or preservatives pregnant or trying to get pregnant breast-feeding How should I use this medication? This medicine is injected under the skin. It is given by a health care provider in a hospital or clinic setting. A special MedGuide will be given to you before each treatment. Be sure to read this information carefully each time. Talk to your health care provider about the use of this medicine in children. While it may be prescribed for children as young as newborns for selected conditions, precautions do apply. Overdosage: If you think you have taken too much of this medicine contact a poison control center or emergency room at once. NOTE: This medicine is only for you. Do not share this medicine with others. What if I miss a dose? Keep appointments for follow-up doses. It is important not to miss your dose. Call your health care provider if you are unable to keep an appointment. What may interact with this medication? Interactions are not expected. This list may not describe all possible interactions. Give your health care provider a list of all the medicines, herbs, non-prescription drugs, or dietary supplements you use. Also tell them if you smoke, drink alcohol, or use illegal drugs. Some items may interact with your medicine. What should I  watch for while using this medication? Visit your health care provider for regular checks on your progress. You may need blood work done while you are taking this medicine. Your condition will be monitored carefully while you are receiving this medicine. It is important not to miss any appointments. What side effects may I notice from receiving this medication? Side effects that you should report to your doctor or health care professional as soon as possible: allergic reactions (skin rash, itching or hives; swelling of the face, lips, or tongue) bleeding (bloody or black, tarry stools; red or dark brown urine; spitting up blood or brown material that looks like coffee grounds; red spots on the skin; unusual bruising or bleeding from the eyes, gums, or nose) blood clot (chest pain; shortness of breath; pain, swelling, or warmth in the leg) stroke (changes in vision; confusion; trouble speaking or understanding; severe headaches; sudden numbness or weakness of the face, arm or leg; trouble walking; dizziness; loss of balance or coordination) Side effects that usually do not require medical attention (report to your doctor or health care professional if they continue or are bothersome): diarrhea dizziness headache joint pain muscle pain stomach pain trouble sleeping This list may not describe all possible side effects. Call your doctor for medical advice about side effects. You may report side effects to FDA at 1-800-FDA-1088. Where should I keep my medication? This medicine is given in a hospital or clinic. It will not be stored at home. NOTE: This sheet is a summary. It may not cover all possible information. If you have questions about this medicine, talk to your doctor, pharmacist, or health care provider.    2022 Elsevier/Gold Standard (2019-04-04 10:28:13)  

## 2020-09-13 ENCOUNTER — Telehealth: Payer: Self-pay | Admitting: Interventional Cardiology

## 2020-09-13 NOTE — Telephone Encounter (Signed)
Returned call to Pt.  He is concerned about a family member.  Is hoping Dr. Tamala Julian can work patient in.  Will discuss with JB.

## 2020-09-13 NOTE — Telephone Encounter (Signed)
Patient call asking that dr Tamala Julian nurse give him a call back

## 2020-09-17 ENCOUNTER — Inpatient Hospital Stay: Payer: Medicare Other

## 2020-09-17 ENCOUNTER — Other Ambulatory Visit: Payer: Self-pay

## 2020-09-17 VITALS — BP 130/73 | HR 56 | Temp 98.0°F | Resp 20

## 2020-09-17 DIAGNOSIS — D693 Immune thrombocytopenic purpura: Secondary | ICD-10-CM

## 2020-09-17 LAB — CBC WITH DIFFERENTIAL (CANCER CENTER ONLY)
Abs Immature Granulocytes: 0.11 10*3/uL — ABNORMAL HIGH (ref 0.00–0.07)
Basophils Absolute: 0.1 10*3/uL (ref 0.0–0.1)
Basophils Relative: 2 %
Eosinophils Absolute: 0.3 10*3/uL (ref 0.0–0.5)
Eosinophils Relative: 3 %
HCT: 38.9 % — ABNORMAL LOW (ref 39.0–52.0)
Hemoglobin: 12.3 g/dL — ABNORMAL LOW (ref 13.0–17.0)
Immature Granulocytes: 1 %
Lymphocytes Relative: 20 %
Lymphs Abs: 1.6 10*3/uL (ref 0.7–4.0)
MCH: 27.5 pg (ref 26.0–34.0)
MCHC: 31.6 g/dL (ref 30.0–36.0)
MCV: 87 fL (ref 80.0–100.0)
Monocytes Absolute: 1.5 10*3/uL — ABNORMAL HIGH (ref 0.1–1.0)
Monocytes Relative: 19 %
Neutro Abs: 4.2 10*3/uL (ref 1.7–7.7)
Neutrophils Relative %: 55 %
Platelet Count: 40 10*3/uL — ABNORMAL LOW (ref 150–400)
RBC: 4.47 MIL/uL (ref 4.22–5.81)
RDW: 16.9 % — ABNORMAL HIGH (ref 11.5–15.5)
WBC Count: 7.7 10*3/uL (ref 4.0–10.5)
nRBC: 0 % (ref 0.0–0.2)

## 2020-09-17 MED ORDER — ROMIPLOSTIM 250 MCG ~~LOC~~ SOLR
3.0000 ug/kg | Freq: Once | SUBCUTANEOUS | Status: AC
Start: 2020-09-17 — End: 2020-09-17
  Administered 2020-09-17: 225 ug via SUBCUTANEOUS
  Filled 2020-09-17: qty 0.45

## 2020-09-17 NOTE — Patient Instructions (Signed)
Romiplostim injection What is this medication? ROMIPLOSTIM (roe mi PLOE stim) helps your body make more platelets. This medicine is used to treat low platelets caused by chronic idiopathic thrombocytopenic purpura (ITP) or a bone marrow syndrome caused by radiation sickness. This medicine may be used for other purposes; ask your health care provider or pharmacist if you have questions. COMMON BRAND NAME(S): Nplate What should I tell my care team before I take this medication? They need to know if you have any of these conditions: blood clots myelodysplastic syndrome an unusual or allergic reaction to romiplostim, mannitol, other medicines, foods, dyes, or preservatives pregnant or trying to get pregnant breast-feeding How should I use this medication? This medicine is injected under the skin. It is given by a health care provider in a hospital or clinic setting. A special MedGuide will be given to you before each treatment. Be sure to read this information carefully each time. Talk to your health care provider about the use of this medicine in children. While it may be prescribed for children as young as newborns for selected conditions, precautions do apply. Overdosage: If you think you have taken too much of this medicine contact a poison control center or emergency room at once. NOTE: This medicine is only for you. Do not share this medicine with others. What if I miss a dose? Keep appointments for follow-up doses. It is important not to miss your dose. Call your health care provider if you are unable to keep an appointment. What may interact with this medication? Interactions are not expected. This list may not describe all possible interactions. Give your health care provider a list of all the medicines, herbs, non-prescription drugs, or dietary supplements you use. Also tell them if you smoke, drink alcohol, or use illegal drugs. Some items may interact with your medicine. What should I  watch for while using this medication? Visit your health care provider for regular checks on your progress. You may need blood work done while you are taking this medicine. Your condition will be monitored carefully while you are receiving this medicine. It is important not to miss any appointments. What side effects may I notice from receiving this medication? Side effects that you should report to your doctor or health care professional as soon as possible: allergic reactions (skin rash, itching or hives; swelling of the face, lips, or tongue) bleeding (bloody or black, tarry stools; red or dark brown urine; spitting up blood or brown material that looks like coffee grounds; red spots on the skin; unusual bruising or bleeding from the eyes, gums, or nose) blood clot (chest pain; shortness of breath; pain, swelling, or warmth in the leg) stroke (changes in vision; confusion; trouble speaking or understanding; severe headaches; sudden numbness or weakness of the face, arm or leg; trouble walking; dizziness; loss of balance or coordination) Side effects that usually do not require medical attention (report to your doctor or health care professional if they continue or are bothersome): diarrhea dizziness headache joint pain muscle pain stomach pain trouble sleeping This list may not describe all possible side effects. Call your doctor for medical advice about side effects. You may report side effects to FDA at 1-800-FDA-1088. Where should I keep my medication? This medicine is given in a hospital or clinic. It will not be stored at home. NOTE: This sheet is a summary. It may not cover all possible information. If you have questions about this medicine, talk to your doctor, pharmacist, or health care provider.    2022 Elsevier/Gold Standard (2019-04-04 10:28:13)  

## 2020-09-24 ENCOUNTER — Inpatient Hospital Stay: Payer: Medicare Other

## 2020-09-24 ENCOUNTER — Other Ambulatory Visit: Payer: Self-pay

## 2020-09-24 VITALS — BP 119/65 | HR 61 | Temp 98.0°F | Resp 18 | Wt 163.8 lb

## 2020-09-24 DIAGNOSIS — D693 Immune thrombocytopenic purpura: Secondary | ICD-10-CM

## 2020-09-24 LAB — CBC WITH DIFFERENTIAL (CANCER CENTER ONLY)
Abs Immature Granulocytes: 0.16 10*3/uL — ABNORMAL HIGH (ref 0.00–0.07)
Basophils Absolute: 0.1 10*3/uL (ref 0.0–0.1)
Basophils Relative: 1 %
Eosinophils Absolute: 0.3 10*3/uL (ref 0.0–0.5)
Eosinophils Relative: 3 %
HCT: 37 % — ABNORMAL LOW (ref 39.0–52.0)
Hemoglobin: 11.9 g/dL — ABNORMAL LOW (ref 13.0–17.0)
Immature Granulocytes: 2 %
Lymphocytes Relative: 15 %
Lymphs Abs: 1.3 10*3/uL (ref 0.7–4.0)
MCH: 27.9 pg (ref 26.0–34.0)
MCHC: 32.2 g/dL (ref 30.0–36.0)
MCV: 86.7 fL (ref 80.0–100.0)
Monocytes Absolute: 1.7 10*3/uL — ABNORMAL HIGH (ref 0.1–1.0)
Monocytes Relative: 19 %
Neutro Abs: 5.4 10*3/uL (ref 1.7–7.7)
Neutrophils Relative %: 60 %
Platelet Count: 291 10*3/uL (ref 150–400)
RBC: 4.27 MIL/uL (ref 4.22–5.81)
RDW: 16.9 % — ABNORMAL HIGH (ref 11.5–15.5)
WBC Count: 9 10*3/uL (ref 4.0–10.5)
nRBC: 0 % (ref 0.0–0.2)

## 2020-09-24 MED ORDER — ROMIPLOSTIM 250 MCG ~~LOC~~ SOLR
2.0000 ug/kg | Freq: Once | SUBCUTANEOUS | Status: AC
Start: 2020-09-24 — End: 2020-09-24
  Administered 2020-09-24: 150 ug via SUBCUTANEOUS
  Filled 2020-09-24: qty 0.3

## 2020-09-24 NOTE — Patient Instructions (Signed)
[facility]  Discharge Instructions: [facility] for [facility] to provide your [facility] and hematology care.   If you have a lab appointment with the [facility], please go directly to the [facility] and check in at the registration area.   Wear comfortable clothing and clothing appropriate for easy access to any Portacath or PICC line.   We strive to give you quality time with your provider. You may need to reschedule your appointment if you arrive late (15 or more minutes).  Arriving late affects you and other patients whose appointments are after yours.  Also, if you miss three or more appointments without notifying the office, you may be dismissed from the clinic at the provider's discretion.      For prescription refill requests, have your pharmacy contact our office and allow 72 hours for refills to be completed.    Today you received the following chemotherapy and/or immunotherapy agents Nplate      To help prevent nausea and vomiting after your treatment, we encourage you to take your nausea medication as directed.  BELOW ARE SYMPTOMS THAT SHOULD BE REPORTED IMMEDIATELY: *FEVER GREATER THAN 100.4 F (38 C) OR HIGHER *CHILLS OR SWEATING *NAUSEA AND VOMITING THAT IS NOT CONTROLLED WITH YOUR NAUSEA MEDICATION *UNUSUAL SHORTNESS OF BREATH *UNUSUAL BRUISING OR BLEEDING *URINARY PROBLEMS (pain or burning when urinating, or frequent urination) *BOWEL PROBLEMS (unusual diarrhea, constipation, pain near the anus) TENDERNESS IN MOUTH AND THROAT WITH OR WITHOUT PRESENCE OF ULCERS (sore throat, sores in mouth, or a toothache) UNUSUAL RASH, SWELLING OR PAIN  UNUSUAL VAGINAL DISCHARGE OR ITCHING   Items with * indicate a potential emergency and should be followed up as soon as possible or go to the Emergency Department if any problems should occur.  Please show the CHEMOTHERAPY ALERT CARD or IMMUNOTHERAPY ALERT CARD at check-in to the  Emergency Department and triage nurse.  Should you have questions after your visit or need to cancel or reschedule your appointment, please contact [facility]  Dept: [phone]  and follow the prompts.  Office hours are 8:00 a.m. to 4:30 p.m. Monday - Friday. Please note that voicemails left after 4:00 p.m. may not be returned until the following business day.  We are closed weekends and major holidays. You have access to a nurse at all times for urgent questions. Please call the main number to the clinic Dept: [phone] and follow the prompts.   For any non-urgent questions, you may also contact your provider using MyChart. We now offer e-Visits for anyone 52 and older to request care online for non-urgent symptoms. For details visit [email or web address].   Also download the MyChart app! Go to the app store, search "MyChart", open the app, select [facility], and log in with your MyChart username and password.  Due to Covid, a mask is required upon entering the hospital/clinic. If you do not have a mask, one will be given to you upon arrival. For doctor visits, patients may have 1 support person aged 68 or older with them. For treatment visits, patients cannot have anyone with them due to current Covid guidelines and our immunocompromised population.

## 2020-09-25 DIAGNOSIS — H409 Unspecified glaucoma: Secondary | ICD-10-CM | POA: Diagnosis not present

## 2020-09-25 DIAGNOSIS — H43821 Vitreomacular adhesion, right eye: Secondary | ICD-10-CM | POA: Diagnosis not present

## 2020-09-25 DIAGNOSIS — Z961 Presence of intraocular lens: Secondary | ICD-10-CM | POA: Diagnosis not present

## 2020-09-25 DIAGNOSIS — H353110 Nonexudative age-related macular degeneration, right eye, stage unspecified: Secondary | ICD-10-CM | POA: Diagnosis not present

## 2020-09-25 DIAGNOSIS — H43812 Vitreous degeneration, left eye: Secondary | ICD-10-CM | POA: Diagnosis not present

## 2020-09-25 DIAGNOSIS — H353221 Exudative age-related macular degeneration, left eye, with active choroidal neovascularization: Secondary | ICD-10-CM | POA: Diagnosis not present

## 2020-09-25 DIAGNOSIS — H35373 Puckering of macula, bilateral: Secondary | ICD-10-CM | POA: Diagnosis not present

## 2020-09-25 NOTE — Progress Notes (Signed)
[name]                                       Cardiology Office Note    Date:  09/26/2020   ID:  [name], DOB 01-10-1924, MRN [id number]  PCP:  [name], MD  Cardiologist:  Sinclair Grooms, MD  / Dr. Angelena Form & Dr. Cyndia Bent (TAVR)  CC: 1 year s/p TAVR  History of Present Illness:  [name] is a 87 y.o. male with a history of idiopathic thrombocytopenia (Rx'd with Nplate and steroids), bifasicular block (RBBB, LAFB), anemia, anxiety, CAD, colon cancer, HLD, GERD, hairy cell leukemia, PAF found on monitor, HTN and severe aortic stenosis s/p TAVR (09/06/19) followed by a readmission for urosepsis/bacteremia who presents to clinic for evaluation of worsening shortness of breath.   He was diagnosed with severe AS in 2021. Diagnostic L/RHC on 07/26/19 showed chronically occluded mid LAD, moderate disease in the diagonals and mid RCA. Medical therapy recommended.   He was evaluated by the multidisciplinary valve team and underwent successful TAVR with a 26 mm Edwards Sapien 3 Ultra THV via the TF approach on 09/06/19. Post operative echo showed EF 65-70%, normally functioning TAVR with a mean gradient of 11 mm Hg and no PVL. He was discharged home on Asprin alone given history of thrombocytopenia. Given underlying conduction disease and TAVR a Zio patch was placed at discharge. Additionally, he had continued issues with urinary retention and was discharged with a foley catheter in place.    Zio patch showed new onset afib with 2% burden. [place] was deferred given low incidence, age and ongoing thrombocytopenia. He was later readmitted 7/16-7/21/21 for urosepsis/bacteremia and CVA by CT. Echo during this admission showed EF 65% with increased mean gradient to 25 mm Hg. TEE was recommended. He was discharged home on IV abx (4 full weeks) and TEE was deferred.  1 month echo showed EF 65%, normally functioning TAVR with a mean gradient  of 23 mm hg (previously 25 mm Hg when admitted for bacteremia) and no PVL. Plan was to repeat echo in three months to follow gradients. However, the patient called back with worsening shortness of breath and TEE was set up which was completed on  11/01/20 and reviewed by the structural heart team who felt the AV looks to be functioning normally with a mean gradient of 19 mm hg. There was a small area on the mitral valve felt to be most consistent with a degenerative strand.   Per review of notes [name] has continued to have ongoing dyspnea and "just not feeling right." There was discussion about possible chronotropic incompetence as a source of dyspnea on exertion.  Today the patient presents to clinic for follow up. He has mild LE edema. No orthopnea or PND. No dizziness or syncope. Does not check HR or BP at home. Off aspirin with issues with thrombocytopenia. Mostly struggles with gait instability and issues with this eyes. He is very sad he can no longer drive his boat. He is trying to focus on the positives vs the negatives. He understands he is doing quite well for 87 years old. No new progressive fatigue, weakness, dizziness or syncope.    Past Medical History:  Diagnosis Date   Acute appendicitis    Anemia    Anxiety    Arthritis    "  back" (03/14/2014)   Blood dyscrasia    hairy cell leukemia   CAD (coronary artery disease)    a. 03/14/14  s/p overlapping DES x2 to mid-distal RCA.   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    Colon cancer ([place]) 1984   Compression fracture of lumbar spine, non-traumatic (HCC)    DJD (degenerative joint disease) of lumbar spine    Dyslipidemia    Dyspnea    Elevated PSA    GERD (gastroesophageal reflux disease)    Glaucoma    Hairy cell leukemia ([place]) dx'd 1980   Heart murmur    History of blood transfusion    "several; related to hairy cell leukemia & tx "   History of stomach ulcers 1968   Hypertension    Leukemia, hairy cell  ([place])    Malnutrition ([place])    Osteoarthritis    Osteoporosis    Pneumonia    S/P TAVR (transcatheter aortic valve replacement) 09/06/2019   s/p TAVR with a 26 mm Edwards S3U via the TF approach by Dr. Angelena Form and Cyndia Bent.    Severe aortic stenosis    s/p tavr   Skin cancer of face     Past Surgical History:  Procedure Laterality Date   APPENDECTOMY  10/2007   BUBBLE STUDY  11/02/2019   Procedure: BUBBLE STUDY;  Surgeon: Elouise Munroe, MD;  Location: [facility] ENDOSCOPY;  Service: Cardiology;;   CATARACT EXTRACTION, BILATERAL Bilateral 02/2008   COLON SURGERY  1984   "sigmoid; open"   CORONARY ANGIOPLASTY WITH STENT PLACEMENT  03/14/2014   "2"   EYE SURGERY Bilateral    cataract   FRACTURE SURGERY     LEFT HEART CATHETERIZATION WITH CORONARY ANGIOGRAM N/A 03/14/2014   Procedure: LEFT HEART CATHETERIZATION WITH CORONARY ANGIOGRAM;  Surgeon: Peter M Martinique, MD;  Location: [facility] CATH LAB;  Service: Cardiovascular;  Laterality: N/A;   LIPOMA EXCISION Right 07/2011   liposarcoma resection; "back"   MOHS SURGERY Left 02/2011   MOHS SURGERY  X 3   "all on my face"   MOLE REMOVAL Left 1985   cheek   ORIF ANKLE FRACTURE Left 2000   RIGHT/LEFT HEART CATH AND CORONARY ANGIOGRAPHY N/A 07/26/2019   Procedure: RIGHT/LEFT HEART CATH AND CORONARY ANGIOGRAPHY;  Surgeon: Belva Crome, MD;  Location: [place] CV LAB;  Service: Cardiovascular;  Laterality: N/A;   SHOULDER SURGERY  04/2004   SPLENECTOMY  1992   TEE WITHOUT CARDIOVERSION N/A 09/06/2019   Procedure: TRANSESOPHAGEAL ECHOCARDIOGRAM (TEE);  Surgeon: Burnell Blanks, MD;  Location: [place] CV LAB;  Service: Open Heart Surgery;  Laterality: N/A;   TEE WITHOUT CARDIOVERSION N/A 11/02/2019   Procedure: TRANSESOPHAGEAL ECHOCARDIOGRAM (TEE);  Surgeon: Elouise Munroe, MD;  Location: [place];  Service: Cardiology;  Laterality: N/A;   TONSILLECTOMY AND ADENOIDECTOMY  1939   TRANSCATHETER AORTIC VALVE REPLACEMENT, TRANSFEMORAL N/A  09/06/2019   Procedure: TRANSCATHETER AORTIC VALVE REPLACEMENT, TRANSFEMORAL;  Surgeon: Burnell Blanks, MD;  Location: [place] CV LAB;  Service: Open Heart Surgery;  Laterality: N/A;    Current Medications: Outpatient Medications Prior to Visit  Medication Sig Dispense Refill   acetaminophen (TYLENOL) 500 MG tablet Take 1,000 mg by mouth every 8 (eight) hours as needed for moderate pain.     amoxicillin (AMOXIL) 500 MG tablet Take 4 capsules (2,000 mg) one hour prior to all dental visits. 8 tablet 11   atorvastatin (LIPITOR) 20 MG tablet TAKE 1 TABLET BY MOUTH DAILY AT 6 PM 90  tablet 2   Cholecalciferol (VITAMIN D3) 50 MCG (2000 UT) TABS Take 2,000 Units by mouth every evening.     desonide (DESOWEN) 0.05 % cream Apply 1 application topically 2 (two) times daily as needed (rash/irritation.). Only takes as needed     ferrous sulfate 325 (65 FE) MG tablet Take 325 mg by mouth 2 (two) times daily with a meal.     finasteride (PROSCAR) 5 MG tablet Take 5 mg by mouth daily.     fluocinonide (LIDEX) 0.05 % external solution Apply 1 application topically 2 (two) times daily as needed (apply to ears).   11   latanoprost (XALATAN) 0.005 % ophthalmic solution Place 1 drop into both eyes at bedtime.      Multiple Vitamins-Minerals (PRESERVISION AREDS 2 PO) Take 1 capsule by mouth 2 (two) times daily.      mupirocin ointment (BACTROBAN) 2 % Apply to affected area     nitroGLYCERIN (NITROSTAT) 0.4 MG SL tablet PLACE AND DISSOLVE 1 TABLET UNDER THE TONGUE EVERY 5 MINUTES AS NEEDED FOR CHEST PAIN FOR UP TO 3 DOSES, CALL 911 IF NO RELIEF AFTER 1ST DOSE 25 tablet 7   pantoprazole (PROTONIX) 40 MG tablet Take 1 tablet (40 mg total) by mouth daily. 90 tablet 3   ranibizumab (LUCENTIS) 0.5 MG/0.05ML SOLN 0.5 mg by Intravitreal route every 6 (six) weeks. Left Eye ONLY     romiPLOStim (NPLATE) 250 MCG injection Inject into the skin once a week.     sertraline (ZOLOFT) 50 MG tablet Take 50 mg by mouth  every evening.      tamsulosin (FLOMAX) 0.4 MG CAPS capsule Take 0.8 mg by mouth daily after supper.      timolol (BETIMOL) 0.5 % ophthalmic solution Place 1 drop into the right eye daily.      metoprolol tartrate (LOPRESSOR) 25 MG tablet Take 0.5 tablets (12.5 mg total) by mouth daily. 90 tablet 2   COVID-19 mRNA Vac-TriS, Pfizer, (PFIZER-BIONT COVID-19 VAC-TRIS) SUSP injection Inject into the muscle. 0.3 mL 0   dexamethasone (DECADRON) 0.1 % ophthalmic suspension Place 1 drop into both eyes 2 (two) times daily.     eltrombopag (PROMACTA) 50 MG tablet Take 1 tablet (50 mg total) by mouth daily. Take on an empty stomach 1 hour before a meal or 2 hours after (Patient not taking: No sig reported) 30 tablet 0   ofloxacin (FLOXIN) 0.3 % OTIC solution SMARTSIG:Left Ear     saccharomyces boulardii (FLORASTOR) 250 MG capsule 1 capsule BID x 1 mo, then daily (Patient not taking: No sig reported)     No facility-administered medications prior to visit.     Allergies:   Antazoline; Antihistamines, chlorpheniramine-type; and Sulfa antibiotics   Social History   Socioeconomic History   Marital status: Married    Spouse name: Not on file   Number of children: Not on file   Years of education: 16   Highest education level: Bachelor's degree (e.g., BA, AB, BS)  Occupational History   Occupation: Retired  Tobacco Use   Smoking status: Former    Packs/day: 0.25    Years: 1.00    Pack years: 0.25    Types: Cigarettes, Cigars   Smokeless tobacco: Never   Tobacco comments:    occasional social smoker during college.  Vaping Use   Vaping Use: Never used  Substance and Sexual Activity   Alcohol use: Yes    Alcohol/week: 9.0 standard drinks    Types: 2 Glasses of wine, 2  Shots of liquor, 5 Standard drinks or equivalent per week    Comment: socially   Drug use: No   Sexual activity: Not Currently  Other Topics Concern   Not on file  Social History Narrative   Not on file   Social Determinants  of Health   Financial Resource Strain: Not on file  Food Insecurity: No Food Insecurity   Worried About Running Out of Food in the Last Year: Never true   Ran Out of Food in the Last Year: Never true  Transportation Needs: No Transportation Needs   Lack of Transportation (Medical): No   Lack of Transportation (Non-Medical): No  Physical Activity: Not on file  Stress: Not on file  Social Connections: Not on file     Family History:  The patient's family history includes Congestive Heart Failure (age of onset: 61) in his father; Diabetes Mellitus II in his brother; Heart Problems in his brother; Prostate cancer in his brother; Stroke in his mother.     ROS:   Please see the history of present illness.    ROS All other systems reviewed and are negative.   PHYSICAL EXAM:   VS:  BP (!) 160/80   Pulse (!) 46   Ht '5\' 4"'$  (1.626 m)   Wt 164 lb 6.4 oz (74.6 kg)   SpO2 97%   BMI 28.22 kg/m    GEN: Well nourished, well developed, in no acute distress HEENT: normal Neck: no JVD or masses Cardiac: RR, brady; no murmurs, rubs, or gallop. Trace bilateral LE edema Respiratory:  clear to auscultation bilaterally, normal work of breathing GI: soft, nontender, nondistended, + BS MS: no deformity or atrophy Skin: warm and dry, no rash Neuro:  Alert and Oriented x 3, Strength and sensation are intact Psych: euthymic mood, full affect   Wt Readings from Last 3 Encounters:  09/26/20 164 lb 6.4 oz (74.6 kg)  09/24/20 163 lb 12.8 oz (74.3 kg)  09/10/20 164 lb 3.2 oz (74.5 kg)      Studies/Labs Reviewed:   EKG:  EKG is ordered today. This shows sinus brady with borderline 1st deg AV block, LAFB and RBBB,. HR 48  Recent Labs: 06/14/2020: NT-Pro BNP 559 08/20/2020: ALT 14; BUN 27; Creatinine 1.27; Potassium 4.2; Sodium 141 09/24/2020: Hemoglobin 11.9; Platelet Count 291   Lipid Panel    Component Value Date/Time   CHOL 96 09/17/2019 0752   TRIG 115 09/17/2019 0752   HDL 30 (L)  09/17/2019 0752   CHOLHDL 3.2 09/17/2019 0752   VLDL 23 09/17/2019 0752   LDLCALC 43 09/17/2019 0752    Additional studies/ records that were reviewed today include:  TAVR OPERATIVE NOTE     Date of Procedure:                09/06/2019   Preoperative Diagnosis:      Severe Aortic Stenosis    Postoperative Diagnosis:    Same    Procedure:        Transcatheter Aortic Valve Replacement - Percutaneous Right Transfemoral Approach             Edwards Sapien 3 Ultra THV (size 26 mm, model # 9750TFX, serial # [id number])              Co-Surgeons:                        Gaye Pollack, MD  /  Lauree Chandler, MD  Anesthesiologist:                  Deatra Canter, MD   Echocardiographer:              Liane Comber, MD   Pre-operative Echo Findings: Severe aortic stenosis Normal left ventricular systolic function   Post-operative Echo Findings: No paravalvular leak Normal left ventricular systolic function   ____________________     Echo 09/07/19: IMPRESSIONS   1. Day 1 post TAVR with normal transaortic gradients with peak/mean 18/11 mmHg. Trivial paravalvular leak.   2. Left ventricular ejection fraction, by estimation, is 65 to 70%. The  left ventricle has normal function. The left ventricle has no regional  wall motion abnormalities. Left ventricular diastolic function could not  be evaluated.   3. Right ventricular systolic function is normal. The right ventricular  size is normal. There is normal pulmonary artery systolic pressure.   4. The mitral valve is normal in structure. Trivial mitral valve  regurgitation. No evidence of mitral stenosis.   5. The aortic valve has been repaired/replaced. Aortic valve  regurgitation is not visualized. No aortic stenosis is present. There is a  26 mm Ultra, stented (TAVR) valve present in the aortic position.  Procedure Date: 09/06/2019. Echo findings are  consistent with normal structure and function of the aortic valve  prosthesis.  Aortic valve mean gradient measures 11.0 mmHg.   6. The inferior vena cava is normal in size with greater than 50%  respiratory variability, suggesting right atrial pressure of 3 mmHg.      ____________________   Echo 09/17/19 IMPRESSIONS   1. Left ventricular ejection fraction, by estimation, is 60 to 65%. The  left ventricle has normal function. The left ventricle has no regional  wall motion abnormalities. Left ventricular diastolic parameters are  consistent with Grade I diastolic  dysfunction (impaired relaxation). Elevated left atrial pressure.   2. Right ventricular systolic function is normal. The right ventricular  size is normal. There is normal pulmonary artery systolic pressure.   3. The mitral valve is normal in structure. Trivial mitral valve  regurgitation. No evidence of mitral stenosis.   4. Edwards Sapien 3 THV size 26 mm, model #[id number] is in the AV  position. Mildly elevated mean gradient of 25 mmHg based on published  normals, has signifcantly increased from prior study 09/07/19. The LVOT/ AV  VTI ratio has also decreased. Limited  morphological visualization of prosthetic valve in general with TTE, no  gross visualized thrombus or vegetation. Given presentation with  bacteremia, CVA, and increased gradient across prosthetic valve would  recommend TEE for better visualization. . The  aortic valve has been repaired/replaced. Aortic valve regurgitation is not  visualized. There is a 26 mm Edwards Sapien prosthetic (TAVR) valve  present in the aortic position. Procedure Date: 09/06/2019.   5. The inferior vena cava is normal in size with greater than 50%  respiratory variability, suggesting right atrial pressure of 3 mmHg.   ___________________     Echo 10/19/19 IMPRESSIONS   1. Left ventricular ejection fraction, by estimation, is 60 to 65%. The  left ventricle has normal function. The left ventricle has no regional  wall motion abnormalities. There is mild  concentric left ventricular  hypertrophy. Left ventricular diastolic  parameters are consistent with Grade I diastolic dysfunction (impaired  relaxation). Elevated left ventricular end-diastolic pressure.   2. Right ventricular systolic function is normal. The right ventricular  size is normal. There is normal pulmonary artery  systolic pressure.   3. The mitral valve is normal in structure. Trivial mitral valve  regurgitation. No evidence of mitral stenosis.   4. Mean gradient 23 mmHg, unchanged from 25 mmHg on 09/2019. The aortic  valve is normal in structure. Aortic valve regurgitation is not  visualized. Moderate aortic valve stenosis. There is a 26 mm Edwards  Sapien prosthetic (TAVR) valve present in the  aortic position. Procedure Date: 09/06/19. Aortic valve area, by VTI  measures 1.01 cm. Aortic valve mean gradient measures 23.0 mmHg. Aortic  valve Vmax measures 3.27 m/s.   5. Aortic dilatation noted. There is mild dilatation of the ascending  aorta.   6. The inferior vena cava is normal in size with greater than 50%  respiratory variability, suggesting right atrial pressure of 3 mmHg.   _________________________   Echo 09/26/20 IMPRESSIONS  1. Left ventricular ejection fraction, by estimation, is 55 to 60%. The left ventricle has normal function. The left ventricle has no regional wall motion abnormalities. There is mild concentric left ventricular hypertrophy. Left ventricular diastolic parameters are consistent with Grade II diastolic dysfunction (pseudonormalization).  2. Right ventricular systolic function is normal. The right ventricular size is mildly enlarged. There is normal pulmonary artery systolic pressure.  3. Left atrial size was mildly dilated.  4. The mitral valve is degenerative. Mild mitral valve regurgitation.  5. The aortic valve has been repaired/replaced. Aortic valve regurgitation is not visualized. There is a 26 mm Sapien prosthetic (TAVR) valve present in  the aortic position. Procedure Date: 09/06/2019. Aortic valve mean gradient measures 10.0 mmHg. Aortic valve acceleration time measures 92 msec.  6. Aortic dilatation noted. There is mild dilatation of the aortic root, measuring 40 mm. There is mild dilatation of the ascending aorta, measuring 41 mm.  7. The inferior vena cava is normal in size with greater than 50% respiratory variability, suggesting right atrial pressure of 3 mmHg.   Comparison(s): A prior study was performed on 01/18/2020. Similar DVI, gradients from prior; no evidence of PVL. Slight increase in aortic size from prior.  ASSESSMENT & PLAN:   Severe AS s/p TAVR: echo today shows EF 55%, normally functioning TAVR with a mean gradient of 10 mm hg and no PVL. He has NYHA class III symptoms which has been chronic. Not on any antiplatelets given thrombocytopenia. He has amoxicillin for SBE prophylaxis.   HTN: BP quite elevated today. 170/90 on my personal recheck.  He has not been checking it at home. HR is also slow today in the 40s. ECG shows sinus brady with borderline 1st deg AV block, LAFB and RBBB, HR 48 . Will stop Lopressor 12.'5mg'$  daily and start Norvasc '5mg'$  daily. Given elevated BPs. They will message me a log of pressures through mychart to see if we will need to make any further adjustments   Thrombocytopenia: Followed closely by Dr. Benay Spice.    Chronic diastolic CHF: now off lasix. Appears euvolemic.    PAF: 2% burden on Zio Patch. Given elderly age and thrombocytopenia, oral anticoagulation has been deferred.   Sinus bradycardia: the patients HR was noted to be in the 40s today. ECG shows sinus brady with borderline 1st deg AV block, LAFB and RBBB, HR 48. He has not had any progressive fatigue, weakness, dizziness or syncope. Given underlying conduction disease and low HRs, will stop Lopressor.    Medication Adjustments/Labs and Tests Ordered: Current medicines are reviewed at length with the patient today.  Concerns  regarding medicines are outlined above.  Medication changes, Labs and Tests ordered today are listed in the Patient Instructions below. Patient Instructions  Medication Instructions:  Your physician has recommended you make the following change in your medication:  1.) stop metoprolol tartrate (Lopressor) 2.) start amlodipine (Norvasc) 5 mg - take one tablet daily  *If you need a refill on your cardiac medications before your next appointment, please call your pharmacy*   Lab Work: none   Testing/Procedures: none   Follow-Up: As planned with Dr. Tamala Julian   Other Instructions   Please keep track of your blood pressure and heart rate reading at home.  Send in a list through MyChart to Nell Range, PA-C in about 2 weeks.      Signed, Angelena Form, PA-C  09/26/2020 4:20 PM    [facility] [address], [place], [place]  [address] Phone: [phone]; Fax: [phone]

## 2020-09-26 ENCOUNTER — Ambulatory Visit (INDEPENDENT_AMBULATORY_CARE_PROVIDER_SITE_OTHER): Payer: Medicare Other | Admitting: Physician Assistant

## 2020-09-26 ENCOUNTER — Ambulatory Visit (HOSPITAL_COMMUNITY): Payer: Medicare Other | Attending: Internal Medicine

## 2020-09-26 ENCOUNTER — Other Ambulatory Visit: Payer: Self-pay

## 2020-09-26 ENCOUNTER — Encounter: Payer: Self-pay | Admitting: Physician Assistant

## 2020-09-26 VITALS — BP 160/80 | HR 46 | Ht 64.0 in | Wt 164.4 lb

## 2020-09-26 DIAGNOSIS — I10 Essential (primary) hypertension: Secondary | ICD-10-CM | POA: Diagnosis not present

## 2020-09-26 DIAGNOSIS — I480 Paroxysmal atrial fibrillation: Secondary | ICD-10-CM | POA: Diagnosis not present

## 2020-09-26 DIAGNOSIS — R001 Bradycardia, unspecified: Secondary | ICD-10-CM | POA: Diagnosis not present

## 2020-09-26 DIAGNOSIS — Z952 Presence of prosthetic heart valve: Secondary | ICD-10-CM

## 2020-09-26 DIAGNOSIS — I5032 Chronic diastolic (congestive) heart failure: Secondary | ICD-10-CM

## 2020-09-26 LAB — ECHOCARDIOGRAM COMPLETE
AR max vel: 1.4 cm2
AV Area VTI: 1.58 cm2
AV Area mean vel: 1.45 cm2
AV Mean grad: 10 mmHg
AV Peak grad: 18.1 mmHg
Ao pk vel: 2.13 m/s
Area-P 1/2: 2.56 cm2
S' Lateral: 2.7 cm

## 2020-09-26 MED ORDER — AMLODIPINE BESYLATE 5 MG PO TABS: 5.0000 mg | ORAL_TABLET | Freq: Every day | ORAL | 3 refills | Status: DC

## 2020-09-26 NOTE — Patient Instructions (Signed)
Medication Instructions:  Your physician has recommended you make the following change in your medication:  1.) stop metoprolol tartrate (Lopressor) 2.) start amlodipine (Norvasc) 5 mg - take one tablet daily  *If you need a refill on your cardiac medications before your next appointment, please call your pharmacy*   Lab Work: none   Testing/Procedures: none   Follow-Up: As planned with Dr. Tamala Julian   Other Instructions   Please keep track of your blood pressure and heart rate reading at home.  Send in a list through MyChart to Nell Range, PA-C in about 2 weeks.

## 2020-09-27 ENCOUNTER — Other Ambulatory Visit: Payer: Self-pay | Admitting: Interventional Cardiology

## 2020-10-01 ENCOUNTER — Inpatient Hospital Stay: Payer: Medicare Other

## 2020-10-01 ENCOUNTER — Inpatient Hospital Stay: Payer: Medicare Other | Attending: Nurse Practitioner

## 2020-10-01 ENCOUNTER — Other Ambulatory Visit: Payer: Self-pay

## 2020-10-01 DIAGNOSIS — D693 Immune thrombocytopenic purpura: Secondary | ICD-10-CM | POA: Diagnosis not present

## 2020-10-01 LAB — CBC WITH DIFFERENTIAL (CANCER CENTER ONLY)
Abs Immature Granulocytes: 0.15 10*3/uL — ABNORMAL HIGH (ref 0.00–0.07)
Basophils Absolute: 0.2 10*3/uL — ABNORMAL HIGH (ref 0.0–0.1)
Basophils Relative: 2 %
Eosinophils Absolute: 0.3 10*3/uL (ref 0.0–0.5)
Eosinophils Relative: 4 %
HCT: 36.5 % — ABNORMAL LOW (ref 39.0–52.0)
Hemoglobin: 11.8 g/dL — ABNORMAL LOW (ref 13.0–17.0)
Immature Granulocytes: 2 %
Lymphocytes Relative: 15 %
Lymphs Abs: 1.4 10*3/uL (ref 0.7–4.0)
MCH: 27.7 pg (ref 26.0–34.0)
MCHC: 32.3 g/dL (ref 30.0–36.0)
MCV: 85.7 fL (ref 80.0–100.0)
Monocytes Absolute: 1.7 10*3/uL — ABNORMAL HIGH (ref 0.1–1.0)
Monocytes Relative: 19 %
Neutro Abs: 5.4 10*3/uL (ref 1.7–7.7)
Neutrophils Relative %: 58 %
Platelet Count: 527 10*3/uL — ABNORMAL HIGH (ref 150–400)
RBC: 4.26 MIL/uL (ref 4.22–5.81)
RDW: 16.8 % — ABNORMAL HIGH (ref 11.5–15.5)
WBC Count: 9.1 10*3/uL (ref 4.0–10.5)
nRBC: 0 % (ref 0.0–0.2)

## 2020-10-01 NOTE — Progress Notes (Signed)
No Nplate injection administered today per order parameters. Patient to return in a week for Labs and possible injection. Scheduling message sent.

## 2020-10-02 DIAGNOSIS — L218 Other seborrheic dermatitis: Secondary | ICD-10-CM | POA: Diagnosis not present

## 2020-10-02 DIAGNOSIS — L821 Other seborrheic keratosis: Secondary | ICD-10-CM | POA: Diagnosis not present

## 2020-10-02 DIAGNOSIS — L820 Inflamed seborrheic keratosis: Secondary | ICD-10-CM | POA: Diagnosis not present

## 2020-10-02 DIAGNOSIS — L570 Actinic keratosis: Secondary | ICD-10-CM | POA: Diagnosis not present

## 2020-10-02 DIAGNOSIS — D225 Melanocytic nevi of trunk: Secondary | ICD-10-CM | POA: Diagnosis not present

## 2020-10-02 DIAGNOSIS — D2270 Melanocytic nevi of unspecified lower limb, including hip: Secondary | ICD-10-CM | POA: Diagnosis not present

## 2020-10-02 DIAGNOSIS — Z85828 Personal history of other malignant neoplasm of skin: Secondary | ICD-10-CM | POA: Diagnosis not present

## 2020-10-02 DIAGNOSIS — D2260 Melanocytic nevi of unspecified upper limb, including shoulder: Secondary | ICD-10-CM | POA: Diagnosis not present

## 2020-10-09 ENCOUNTER — Telehealth: Payer: Self-pay

## 2020-10-09 ENCOUNTER — Other Ambulatory Visit: Payer: Self-pay

## 2020-10-09 ENCOUNTER — Inpatient Hospital Stay: Payer: Medicare Other

## 2020-10-09 ENCOUNTER — Inpatient Hospital Stay (HOSPITAL_BASED_OUTPATIENT_CLINIC_OR_DEPARTMENT_OTHER): Payer: Medicare Other | Admitting: Oncology

## 2020-10-09 VITALS — BP 133/71 | HR 57 | Temp 98.1°F | Resp 20 | Ht 64.0 in | Wt 164.2 lb

## 2020-10-09 DIAGNOSIS — D693 Immune thrombocytopenic purpura: Secondary | ICD-10-CM

## 2020-10-09 LAB — CBC WITH DIFFERENTIAL (CANCER CENTER ONLY)
Abs Immature Granulocytes: 0.11 10*3/uL — ABNORMAL HIGH (ref 0.00–0.07)
Basophils Absolute: 0.1 10*3/uL (ref 0.0–0.1)
Basophils Relative: 1 %
Eosinophils Absolute: 0.2 10*3/uL (ref 0.0–0.5)
Eosinophils Relative: 3 %
HCT: 37.2 % — ABNORMAL LOW (ref 39.0–52.0)
Hemoglobin: 11.9 g/dL — ABNORMAL LOW (ref 13.0–17.0)
Immature Granulocytes: 1 %
Lymphocytes Relative: 16 %
Lymphs Abs: 1.4 10*3/uL (ref 0.7–4.0)
MCH: 27.5 pg (ref 26.0–34.0)
MCHC: 32 g/dL (ref 30.0–36.0)
MCV: 86.1 fL (ref 80.0–100.0)
Monocytes Absolute: 1.8 10*3/uL — ABNORMAL HIGH (ref 0.1–1.0)
Monocytes Relative: 20 %
Neutro Abs: 5.1 10*3/uL (ref 1.7–7.7)
Neutrophils Relative %: 59 %
Platelet Count: 166 10*3/uL (ref 150–400)
RBC: 4.32 MIL/uL (ref 4.22–5.81)
RDW: 16 % — ABNORMAL HIGH (ref 11.5–15.5)
WBC Count: 8.7 10*3/uL (ref 4.0–10.5)
nRBC: 0 % (ref 0.0–0.2)

## 2020-10-09 MED ORDER — ROMIPLOSTIM 125 MCG ~~LOC~~ SOLR
1.0000 ug/kg | Freq: Once | SUBCUTANEOUS | Status: AC
  Administered 2020-10-09: 75 ug via SUBCUTANEOUS
  Filled 2020-10-09: qty 0.15

## 2020-10-09 NOTE — Progress Notes (Signed)
  [facility] OFFICE PROGRESS NOTE   Diagnosis: ITP  INTERVAL HISTORY:   Mr. Troung returns as scheduled.  He is here with his wife.  No new complaint.  Nplate was held last week when the platelet count returned at 527,000.  Objective:  Vital signs in last 24 hours:  Blood pressure 133/71, pulse (!) 57, temperature 98.1 F (36.7 C), temperature source Oral, resp. rate 20, height 5' 4" (1.626 m), weight 164 lb 3.2 oz (74.5 kg), SpO2 98 %.      Resp: Lungs clear bilaterally Cardio: Regular rate and rhythm GI: No hepatomegaly Vascular: Trace lower leg edema bilaterally   Lab Results:  Lab Results  Component Value Date   WBC 8.7 10/09/2020   HGB 11.9 (L) 10/09/2020   HCT 37.2 (L) 10/09/2020   MCV 86.1 10/09/2020   PLT 166 10/09/2020   NEUTROABS 5.1 10/09/2020    CMP  Lab Results  Component Value Date   NA 141 08/20/2020   K 4.2 08/20/2020   CL 108 08/20/2020   CO2 25 08/20/2020   GLUCOSE 113 (H) 08/20/2020   BUN 27 (H) 08/20/2020   CREATININE 1.27 (H) 08/20/2020   CALCIUM 9.1 08/20/2020   PROT 6.5 08/20/2020   ALBUMIN 3.9 08/20/2020   AST 18 08/20/2020   ALT 14 08/20/2020   ALKPHOS 106 08/20/2020   BILITOT 0.4 08/20/2020   GFRNONAA 52 (L) 08/20/2020   GFRAA 59 (L) 10/31/2019     Medications: I have reviewed the patient's current medications.   Assessment/Plan:  Thrombocytopenia Bone marrow biopsy 07/08/2016-no evidence of B-cell lymphoma, 40% cellular bone marrow with trilineage hematopoiesis, megakaryocytes present with normal morphology, normal cytogenetics, negative for BRAF mutation Flow cytometry 01/05/2009-no monoclonal B-cell or phenotypically abnormal T-cell population Prednisone starting 06/29/2019 Nplate 07/04/2019, 06/27/621, Nplate 07/18/2019 Prednisone 20 mg daily beginning 07/25/2019 Platelets 11,000 on 08/15/2019, prednisone increased to 40 mg daily, Nplate resume 7/62/8315  Prednisone decreased to 30 mg daily 08/25/2019, Nplate  continued Prednisone continued at 30 mg daily 09/13/2019, weekly Nplate continued Prednisone taper to 20 mg daily 09/23/2019, weekly Nplate continued Prednisone taper to 10 mg daily 09/30/2019, weekly Nplate continued Prednisone taper to 5 mg daily 10/21/2019, weekly Nplate continued Prednisone taper to 5 mg every other day for 5 doses then stop 11/11/2019, weekly Nplate continued Nplate last given 1/76/1607 Promacta cost prohibitive Nplate beginning 05/07/1060, held 08/27/2020 and 09/03/2020 due to patient vacation Nplate resumed 6/94/8546 Nplate held 04/09/348 secondary to an elevated platelet count Nplate resumed with dose reduction to 1 mcg/kg 10/09/2020 Hairy cell leukemia 1982, status post splenectomy Coronary artery disease Aortic stenosis Liposarcoma at the right chest wall resected in 2013 Macular degeneration Hearing loss Basal cell carcinoma left cheek 05/24/2019 Left upper lobe nodule, faint FDG activity on PET at [facility] 05/31/2013 Status post TAVR procedure 09/06/2019 Admission with Pseudomonas urosepsis 09/16/2019 Acute left MCA distribution CVA 09/16/2019 Covid January 2022 Mild anemia    Disposition: [name] appears stable.  He will continue weekly Nplate.  We will wean the Nplate dose as tolerated.  He has a history of iron deficiency.  He is taking iron.  He will return a set of stool Hemoccult cards.  We will check the ferritin level when he returns next week.  Mr. Nimmons will return for an office visit in 3 weeks.  Betsy Coder, MD  10/09/2020  10:47 AM

## 2020-10-09 NOTE — Patient Instructions (Signed)
[facility]  Discharge Instructions: Thank you for choosing [facility] to provide your oncology and hematology care.   If you have a lab appointment with the [facility], please go directly to the [facility] and check in at the registration area.   Wear comfortable clothing and clothing appropriate for easy access to any Portacath or PICC line.   We strive to give you quality time with your provider. You may need to reschedule your appointment if you arrive late (15 or more minutes).  Arriving late affects you and other patients whose appointments are after yours.  Also, if you miss three or more appointments without notifying the office, you may be dismissed from the clinic at the provider's discretion.      For prescription refill requests, have your pharmacy contact our office and allow 72 hours for refills to be completed.    Today you received: nplate  Romiplostim injection What is this medication? ROMIPLOSTIM (roe mi PLOE stim) helps your body make more platelets. This medicine is used to treat low platelets caused by chronic idiopathic thrombocytopenic purpura (ITP) or a bone marrow syndrome caused by radiationsickness. This medicine may be used for other purposes; ask your health care provider orpharmacist if you have questions. COMMON BRAND NAME(S): Nplate What should I tell my care team before I take this medication? They need to know if you have any of these conditions: blood clots myelodysplastic syndrome an unusual or allergic reaction to romiplostim, mannitol, other medicines, foods, dyes, or preservatives pregnant or trying to get pregnant breast-feeding How should I use this medication? This medicine is injected under the skin. It is given by a health care providerin a hospital or clinic setting. A special MedGuide will be given to you before each treatment. Be sure to readthis information carefully each time. Talk to your  health care provider about the use of this medicine in children. While it may be prescribed for children as young as newborns for selectedconditions, precautions do apply. Overdosage: If you think you have taken too much of this medicine contact apoison control center or emergency room at once. NOTE: This medicine is only for you. Do not share this medicine with others. What if I miss a dose? Keep appointments for follow-up doses. It is important not to miss your dose.Call your health care provider if you are unable to keep an appointment. What may interact with this medication? Interactions are not expected. This list may not describe all possible interactions. Give your health care provider a list of all the medicines, herbs, non-prescription drugs, or dietary supplements you use. Also tell them if you smoke, drink alcohol, or use illegaldrugs. Some items may interact with your medicine. What should I watch for while using this medication? Visit your health care provider for regular checks on your progress. You may need blood work done while you are taking this medicine. Your condition will be monitored carefully while you are receiving this medicine. It is important notto miss any appointments. What side effects may I notice from receiving this medication? Side effects that you should report to your doctor or health care professionalas soon as possible: allergic reactions (skin rash, itching or hives; swelling of the face, lips, or tongue) bleeding (bloody or black, tarry stools; red or dark brown urine; spitting up blood or brown material that looks like coffee grounds; red spots on the skin; unusual bruising or bleeding from the eyes, gums, or nose) blood clot (  chest pain; shortness of breath; pain, swelling, or warmth in the leg) stroke (changes in vision; confusion; trouble speaking or understanding; severe headaches; sudden numbness or weakness of the face, arm or leg; trouble walking;  dizziness; loss of balance or coordination) Side effects that usually do not require medical attention (report to yourdoctor or health care professional if they continue or are bothersome): diarrhea dizziness headache joint pain muscle pain stomach pain trouble sleeping This list may not describe all possible side effects. Call your doctor for medical advice about side effects. You may report side effects to FDA at1-800-FDA-1088. Where should I keep my medication? This medicine is given in a hospital or clinic. It will not be stored at home. NOTE: This sheet is a summary. It may not cover all possible information. If you have questions about this medicine, talk to your doctor, pharmacist, orhealth care provider.  2022 Elsevier/Gold Standard (2019-04-04 10:28:13)    To help prevent nausea and vomiting after your treatment, we encourage you to take your nausea medication as directed.  BELOW ARE SYMPTOMS THAT SHOULD BE REPORTED IMMEDIATELY: *FEVER GREATER THAN 100.4 F (38 C) OR HIGHER *CHILLS OR SWEATING *NAUSEA AND VOMITING THAT IS NOT CONTROLLED WITH YOUR NAUSEA MEDICATION *UNUSUAL SHORTNESS OF BREATH *UNUSUAL BRUISING OR BLEEDING *URINARY PROBLEMS (pain or burning when urinating, or frequent urination) *BOWEL PROBLEMS (unusual diarrhea, constipation, pain near the anus) TENDERNESS IN MOUTH AND THROAT WITH OR WITHOUT PRESENCE OF ULCERS (sore throat, sores in mouth, or a toothache) UNUSUAL RASH, SWELLING OR PAIN  UNUSUAL VAGINAL DISCHARGE OR ITCHING   Items with * indicate a potential emergency and should be followed up as soon as possible or go to the Emergency Department if any problems should occur.  Please show the CHEMOTHERAPY ALERT CARD or IMMUNOTHERAPY ALERT CARD at check-in to the Emergency Department and triage nurse.  Should you have questions after your visit or need to cancel or reschedule your appointment, please contact [facility]  Dept:  [phone]  and follow the prompts.  Office hours are 8:00 a.m. to 4:30 p.m. Monday - Friday. Please note that voicemails left after 4:00 p.m. may not be returned until the following business day.  We are closed weekends and major holidays. You have access to a nurse at all times for urgent questions. Please call the main number to the clinic Dept: [phone] and follow the prompts.   For any non-urgent questions, you may also contact your provider using MyChart. We now offer e-Visits for anyone 44 and older to request care online for non-urgent symptoms. For details visit [email or web address].   Also download the MyChart app! Go to the app store, search "MyChart", open the app, select [facility], and log in with your MyChart username and password.  Due to Covid, a mask is required upon entering the hospital/clinic. If you do not have a mask, one will be given to you upon arrival. For doctor visits, patients may have 1 support person aged 53 or older with them. For treatment visits, patients cannot have anyone with them due to current Covid guidelines and our immunocompromised population.

## 2020-10-09 NOTE — Telephone Encounter (Signed)
Per Dr Benay Spice Nplate dose changed to 38mg/Kg [facility]

## 2020-10-14 DIAGNOSIS — D693 Immune thrombocytopenic purpura: Secondary | ICD-10-CM | POA: Diagnosis not present

## 2020-10-15 ENCOUNTER — Inpatient Hospital Stay: Payer: Medicare Other

## 2020-10-15 ENCOUNTER — Other Ambulatory Visit: Payer: Self-pay

## 2020-10-15 VITALS — BP 161/74 | HR 51 | Temp 97.8°F | Resp 20 | Ht 64.0 in | Wt 162.6 lb

## 2020-10-15 DIAGNOSIS — D693 Immune thrombocytopenic purpura: Secondary | ICD-10-CM

## 2020-10-15 LAB — CBC WITH DIFFERENTIAL (CANCER CENTER ONLY)
Abs Immature Granulocytes: 0.12 10*3/uL — ABNORMAL HIGH (ref 0.00–0.07)
Basophils Absolute: 0.2 10*3/uL — ABNORMAL HIGH (ref 0.0–0.1)
Basophils Relative: 2 %
Eosinophils Absolute: 0.2 10*3/uL (ref 0.0–0.5)
Eosinophils Relative: 2 %
HCT: 39.5 % (ref 39.0–52.0)
Hemoglobin: 12.7 g/dL — ABNORMAL LOW (ref 13.0–17.0)
Immature Granulocytes: 2 %
Lymphocytes Relative: 16 %
Lymphs Abs: 1.4 10*3/uL (ref 0.7–4.0)
MCH: 27.4 pg (ref 26.0–34.0)
MCHC: 32.2 g/dL (ref 30.0–36.0)
MCV: 85.1 fL (ref 80.0–100.0)
Monocytes Absolute: 1.3 10*3/uL — ABNORMAL HIGH (ref 0.1–1.0)
Monocytes Relative: 16 %
Neutro Abs: 5.1 10*3/uL (ref 1.7–7.7)
Neutrophils Relative %: 62 %
Platelet Count: 67 10*3/uL — ABNORMAL LOW (ref 150–400)
RBC: 4.64 MIL/uL (ref 4.22–5.81)
RDW: 16 % — ABNORMAL HIGH (ref 11.5–15.5)
WBC Count: 8.2 10*3/uL (ref 4.0–10.5)
nRBC: 0 % (ref 0.0–0.2)

## 2020-10-15 LAB — OCCULT BLOOD X 1 CARD TO LAB, STOOL
Fecal Occult Bld: NEGATIVE
Fecal Occult Bld: NEGATIVE
Fecal Occult Bld: POSITIVE — AB

## 2020-10-15 LAB — FERRITIN: Ferritin: 64 ng/mL (ref 24–336)

## 2020-10-15 MED ORDER — ROMIPLOSTIM 250 MCG ~~LOC~~ SOLR
2.0000 ug/kg | Freq: Once | SUBCUTANEOUS | Status: AC
  Administered 2020-10-15: 150 ug via SUBCUTANEOUS
  Filled 2020-10-15: qty 0.3

## 2020-10-15 NOTE — Patient Instructions (Signed)
Romiplostim injection What is this medication? ROMIPLOSTIM (roe mi PLOE stim) helps your body make more platelets. This medicine is used to treat low platelets caused by chronic idiopathic thrombocytopenic purpura (ITP) or a bone marrow syndrome caused by radiation sickness. This medicine may be used for other purposes; ask your health care provider or pharmacist if you have questions. COMMON BRAND NAME(S): Nplate What should I tell my care team before I take this medication? They need to know if you have any of these conditions: blood clots myelodysplastic syndrome an unusual or allergic reaction to romiplostim, mannitol, other medicines, foods, dyes, or preservatives pregnant or trying to get pregnant breast-feeding How should I use this medication? This medicine is injected under the skin. It is given by a health care provider in a hospital or clinic setting. A special MedGuide will be given to you before each treatment. Be sure to read this information carefully each time. Talk to your health care provider about the use of this medicine in children. While it may be prescribed for children as young as newborns for selected conditions, precautions do apply. Overdosage: If you think you have taken too much of this medicine contact a poison control center or emergency room at once. NOTE: This medicine is only for you. Do not share this medicine with others. What if I miss a dose? Keep appointments for follow-up doses. It is important not to miss your dose. Call your health care provider if you are unable to keep an appointment. What may interact with this medication? Interactions are not expected. This list may not describe all possible interactions. Give your health care provider a list of all the medicines, herbs, non-prescription drugs, or dietary supplements you use. Also tell them if you smoke, drink alcohol, or use illegal drugs. Some items may interact with your medicine. What should I  watch for while using this medication? Visit your health care provider for regular checks on your progress. You may need blood work done while you are taking this medicine. Your condition will be monitored carefully while you are receiving this medicine. It is important not to miss any appointments. What side effects may I notice from receiving this medication? Side effects that you should report to your doctor or health care professional as soon as possible: allergic reactions (skin rash, itching or hives; swelling of the face, lips, or tongue) bleeding (bloody or black, tarry stools; red or dark brown urine; spitting up blood or brown material that looks like coffee grounds; red spots on the skin; unusual bruising or bleeding from the eyes, gums, or nose) blood clot (chest pain; shortness of breath; pain, swelling, or warmth in the leg) stroke (changes in vision; confusion; trouble speaking or understanding; severe headaches; sudden numbness or weakness of the face, arm or leg; trouble walking; dizziness; loss of balance or coordination) Side effects that usually do not require medical attention (report to your doctor or health care professional if they continue or are bothersome): diarrhea dizziness headache joint pain muscle pain stomach pain trouble sleeping This list may not describe all possible side effects. Call your doctor for medical advice about side effects. You may report side effects to FDA at 1-800-FDA-1088. Where should I keep my medication? This medicine is given in a hospital or clinic. It will not be stored at home. NOTE: This sheet is a summary. It may not cover all possible information. If you have questions about this medicine, talk to your doctor, pharmacist, or health care provider.    2022 Elsevier/Gold Standard (2019-04-04 10:28:13)  

## 2020-10-22 ENCOUNTER — Inpatient Hospital Stay: Payer: Medicare Other

## 2020-10-22 ENCOUNTER — Other Ambulatory Visit: Payer: Self-pay

## 2020-10-22 VITALS — BP 145/70 | HR 55 | Temp 97.8°F | Resp 18

## 2020-10-22 DIAGNOSIS — D693 Immune thrombocytopenic purpura: Secondary | ICD-10-CM | POA: Diagnosis not present

## 2020-10-22 LAB — CBC WITH DIFFERENTIAL (CANCER CENTER ONLY)
Abs Immature Granulocytes: 0.19 10*3/uL — ABNORMAL HIGH (ref 0.00–0.07)
Basophils Absolute: 0.1 10*3/uL (ref 0.0–0.1)
Basophils Relative: 1 %
Eosinophils Absolute: 0.2 10*3/uL (ref 0.0–0.5)
Eosinophils Relative: 3 %
HCT: 37.8 % — ABNORMAL LOW (ref 39.0–52.0)
Hemoglobin: 12 g/dL — ABNORMAL LOW (ref 13.0–17.0)
Immature Granulocytes: 2 %
Lymphocytes Relative: 16 %
Lymphs Abs: 1.4 10*3/uL (ref 0.7–4.0)
MCH: 27.5 pg (ref 26.0–34.0)
MCHC: 31.7 g/dL (ref 30.0–36.0)
MCV: 86.7 fL (ref 80.0–100.0)
Monocytes Absolute: 1.5 10*3/uL — ABNORMAL HIGH (ref 0.1–1.0)
Monocytes Relative: 17 %
Neutro Abs: 5.5 10*3/uL (ref 1.7–7.7)
Neutrophils Relative %: 61 %
Platelet Count: 154 10*3/uL (ref 150–400)
RBC: 4.36 MIL/uL (ref 4.22–5.81)
RDW: 16.5 % — ABNORMAL HIGH (ref 11.5–15.5)
WBC Count: 9 10*3/uL (ref 4.0–10.5)
nRBC: 0 % (ref 0.0–0.2)

## 2020-10-22 MED ORDER — ROMIPLOSTIM 125 MCG ~~LOC~~ SOLR
1.0000 ug/kg | Freq: Once | SUBCUTANEOUS | Status: AC
  Administered 2020-10-22: 75 ug via SUBCUTANEOUS
  Filled 2020-10-22: qty 0.15

## 2020-10-22 NOTE — Patient Instructions (Signed)
Romiplostim injection What is this medication? ROMIPLOSTIM (roe mi PLOE stim) helps your body make more platelets. This medicine is used to treat low platelets caused by chronic idiopathic thrombocytopenic purpura (ITP) or a bone marrow syndrome caused by radiation sickness. This medicine may be used for other purposes; ask your health care provider or pharmacist if you have questions. COMMON BRAND NAME(S): Nplate What should I tell my care team before I take this medication? They need to know if you have any of these conditions: blood clots myelodysplastic syndrome an unusual or allergic reaction to romiplostim, mannitol, other medicines, foods, dyes, or preservatives pregnant or trying to get pregnant breast-feeding How should I use this medication? This medicine is injected under the skin. It is given by a health care provider in a hospital or clinic setting. A special MedGuide will be given to you before each treatment. Be sure to read this information carefully each time. Talk to your health care provider about the use of this medicine in children. While it may be prescribed for children as young as newborns for selected conditions, precautions do apply. Overdosage: If you think you have taken too much of this medicine contact a poison control center or emergency room at once. NOTE: This medicine is only for you. Do not share this medicine with others. What if I miss a dose? Keep appointments for follow-up doses. It is important not to miss your dose. Call your health care provider if you are unable to keep an appointment. What may interact with this medication? Interactions are not expected. This list may not describe all possible interactions. Give your health care provider a list of all the medicines, herbs, non-prescription drugs, or dietary supplements you use. Also tell them if you smoke, drink alcohol, or use illegal drugs. Some items may interact with your medicine. What should I  watch for while using this medication? Visit your health care provider for regular checks on your progress. You may need blood work done while you are taking this medicine. Your condition will be monitored carefully while you are receiving this medicine. It is important not to miss any appointments. What side effects may I notice from receiving this medication? Side effects that you should report to your doctor or health care professional as soon as possible: allergic reactions (skin rash, itching or hives; swelling of the face, lips, or tongue) bleeding (bloody or black, tarry stools; red or dark brown urine; spitting up blood or brown material that looks like coffee grounds; red spots on the skin; unusual bruising or bleeding from the eyes, gums, or nose) blood clot (chest pain; shortness of breath; pain, swelling, or warmth in the leg) stroke (changes in vision; confusion; trouble speaking or understanding; severe headaches; sudden numbness or weakness of the face, arm or leg; trouble walking; dizziness; loss of balance or coordination) Side effects that usually do not require medical attention (report to your doctor or health care professional if they continue or are bothersome): diarrhea dizziness headache joint pain muscle pain stomach pain trouble sleeping This list may not describe all possible side effects. Call your doctor for medical advice about side effects. You may report side effects to FDA at 1-800-FDA-1088. Where should I keep my medication? This medicine is given in a hospital or clinic. It will not be stored at home. NOTE: This sheet is a summary. It may not cover all possible information. If you have questions about this medicine, talk to your doctor, pharmacist, or health care provider.    2022 Elsevier/Gold Standard (2019-04-04 10:28:13)  

## 2020-10-29 ENCOUNTER — Encounter: Payer: Self-pay | Admitting: Nurse Practitioner

## 2020-10-29 ENCOUNTER — Inpatient Hospital Stay: Payer: Medicare Other

## 2020-10-29 ENCOUNTER — Inpatient Hospital Stay (HOSPITAL_BASED_OUTPATIENT_CLINIC_OR_DEPARTMENT_OTHER): Payer: Medicare Other | Admitting: Nurse Practitioner

## 2020-10-29 ENCOUNTER — Other Ambulatory Visit: Payer: Self-pay

## 2020-10-29 VITALS — BP 120/62 | HR 53 | Temp 97.8°F | Resp 18 | Ht 64.0 in | Wt 163.0 lb

## 2020-10-29 DIAGNOSIS — D693 Immune thrombocytopenic purpura: Secondary | ICD-10-CM | POA: Diagnosis not present

## 2020-10-29 LAB — CBC WITH DIFFERENTIAL (CANCER CENTER ONLY)
Abs Immature Granulocytes: 0.21 10*3/uL — ABNORMAL HIGH (ref 0.00–0.07)
Basophils Absolute: 0.2 10*3/uL — ABNORMAL HIGH (ref 0.0–0.1)
Basophils Relative: 2 %
Eosinophils Absolute: 0.3 10*3/uL (ref 0.0–0.5)
Eosinophils Relative: 3 %
HCT: 37.5 % — ABNORMAL LOW (ref 39.0–52.0)
Hemoglobin: 11.9 g/dL — ABNORMAL LOW (ref 13.0–17.0)
Immature Granulocytes: 2 %
Lymphocytes Relative: 16 %
Lymphs Abs: 1.5 10*3/uL (ref 0.7–4.0)
MCH: 27.6 pg (ref 26.0–34.0)
MCHC: 31.7 g/dL (ref 30.0–36.0)
MCV: 87 fL (ref 80.0–100.0)
Monocytes Absolute: 1.6 10*3/uL — ABNORMAL HIGH (ref 0.1–1.0)
Monocytes Relative: 17 %
Neutro Abs: 5.5 10*3/uL (ref 1.7–7.7)
Neutrophils Relative %: 60 %
Platelet Count: 336 10*3/uL (ref 150–400)
RBC: 4.31 MIL/uL (ref 4.22–5.81)
RDW: 16.8 % — ABNORMAL HIGH (ref 11.5–15.5)
WBC Count: 9.2 10*3/uL (ref 4.0–10.5)
nRBC: 0 % (ref 0.0–0.2)

## 2020-10-29 MED ORDER — ROMIPLOSTIM 125 MCG ~~LOC~~ SOLR
0.5000 ug/kg | Freq: Once | SUBCUTANEOUS | Status: AC
  Administered 2020-10-29: 35 ug via SUBCUTANEOUS
  Filled 2020-10-29: qty 0.07

## 2020-10-29 NOTE — Progress Notes (Signed)
[name] OFFICE PROGRESS NOTE   Diagnosis: ITP  INTERVAL HISTORY:   Mr. Bee returns as scheduled.  He continues weekly Nplate injections.  He denies bleeding.  He notes constipation with oral iron.  He reports a remote history of colon cancer.  Objective:  Vital signs in last 24 hours:  Blood pressure 120/62, pulse (!) 53, temperature 97.8 F (36.6 C), temperature source Oral, resp. rate 18, height '5\' 4"'  (1.626 m), weight 163 lb (73.9 kg), SpO2 97 %.    Resp: Lungs clear bilaterally. Cardio: Regular rate and rhythm. GI: Abdomen soft and nontender.  No hepatomegaly. Vascular: Trace bilateral ankle edema.   Lab Results:  Lab Results  Component Value Date   WBC 9.2 10/29/2020   HGB 11.9 (L) 10/29/2020   HCT 37.5 (L) 10/29/2020   MCV 87.0 10/29/2020   PLT 336 10/29/2020   NEUTROABS 5.5 10/29/2020    Imaging:  No results found.  Medications: I have reviewed the patient's current medications.  Assessment/Plan: Thrombocytopenia Bone marrow biopsy 07/08/2016-no evidence of B-cell lymphoma, 40% cellular bone marrow with trilineage hematopoiesis, megakaryocytes present with normal morphology, normal cytogenetics, negative for BRAF mutation Flow cytometry 01/05/2009-no monoclonal B-cell or phenotypically abnormal T-cell population Prednisone starting 06/29/2019 Nplate 07/04/2019, 1/69/6789, Nplate 07/18/2019 Prednisone 20 mg daily beginning 07/25/2019 Platelets 11,000 on 08/15/2019, prednisone increased to 40 mg daily, Nplate resume 3/81/0175  Prednisone decreased to 30 mg daily 08/25/2019, Nplate continued Prednisone continued at 30 mg daily 09/13/2019, weekly Nplate continued Prednisone taper to 20 mg daily 09/23/2019, weekly Nplate continued Prednisone taper to 10 mg daily 09/30/2019, weekly Nplate continued Prednisone taper to 5 mg daily 10/21/2019, weekly Nplate continued Prednisone taper to 5 mg every other day for 5 doses then stop 11/11/2019, weekly Nplate  continued Nplate last given 03/04/5850 Promacta cost prohibitive Nplate beginning 09/06/8240, held 08/27/2020 and 09/03/2020 due to patient vacation Nplate resumed 3/53/6144 Nplate held 05/01/5398 secondary to an elevated platelet count Nplate resumed with dose reduction to 1 mcg/kg 10/09/2020 Nplate dose decreased to 0.5 mcg/kg 10/29/2020 Hairy cell leukemia 1982, status post splenectomy Coronary artery disease Aortic stenosis Liposarcoma at the right chest wall resected in 2013 Macular degeneration Hearing loss Basal cell carcinoma left cheek 05/24/2019 Left upper lobe nodule, faint FDG activity on PET at [facility] 05/31/2013 Status post TAVR procedure 09/06/2019 Admission with Pseudomonas urosepsis 09/16/2019 Acute left MCA distribution CVA 09/16/2019 Covid January 2022 Mild anemia, 1 of 3 stool Hemoccult cards positive for occult blood 10/14/2020      Disposition: Mr. Scullion appears unchanged.  We reviewed the CBC from today.  Platelet count remains in normal range.  Nplate dose will be decreased to 0.5 mcg/kg beginning today.  Plan to continue lab and weekly Nplate.  He has persistent mild anemia.  1 of 3 stool Hemoccult cards returned positive for occult blood on 10/14/2020.  His wife would like for him to see Dr. Cristina Gong.  We will contact Dr. Osborn Coho office.  Plan to continue weekly lab and Nplate.  He will return for follow-up in 3 to 4 weeks.  We are available to see him sooner if needed.  For the constipation related to iron he will begin a stool softener.  Patient seen with Dr. Benay Spice.    [name] ANP/GNP-BC   10/29/2020  1:21 PM This was a shared visit with [name].  The platelet count remains adequate.  We will continue tapering the Nplate dose as tolerated.  Mr. Cellucci has a history of iron  deficiency.  The stool returned Hemoccult positive.  We discussed the differential diagnosis with Mr. Peyton and his wife.  He would like me to contact Dr. Cristina Gong to discuss the indication  for further evaluation.  We may consider referring him for a Cologuard evaluation.  I was present for greater than 50% of today's visit.  I performed medical decision making.  Julieanne Manson, MD

## 2020-10-29 NOTE — Patient Instructions (Signed)
Romiplostim injection What is this medication? ROMIPLOSTIM (roe mi PLOE stim) helps your body make more platelets. This medicine is used to treat low platelets caused by chronic idiopathic thrombocytopenic purpura (ITP) or a bone marrow syndrome caused by radiation sickness. This medicine may be used for other purposes; ask your health care provider or pharmacist if you have questions. COMMON BRAND NAME(S): Nplate What should I tell my care team before I take this medication? They need to know if you have any of these conditions: blood clots myelodysplastic syndrome an unusual or allergic reaction to romiplostim, mannitol, other medicines, foods, dyes, or preservatives pregnant or trying to get pregnant breast-feeding How should I use this medication? This medicine is injected under the skin. It is given by a health care provider in a hospital or clinic setting. A special MedGuide will be given to you before each treatment. Be sure to read this information carefully each time. Talk to your health care provider about the use of this medicine in children. While it may be prescribed for children as young as newborns for selected conditions, precautions do apply. Overdosage: If you think you have taken too much of this medicine contact a poison control center or emergency room at once. NOTE: This medicine is only for you. Do not share this medicine with others. What if I miss a dose? Keep appointments for follow-up doses. It is important not to miss your dose. Call your health care provider if you are unable to keep an appointment. What may interact with this medication? Interactions are not expected. This list may not describe all possible interactions. Give your health care provider a list of all the medicines, herbs, non-prescription drugs, or dietary supplements you use. Also tell them if you smoke, drink alcohol, or use illegal drugs. Some items may interact with your medicine. What should I  watch for while using this medication? Visit your health care provider for regular checks on your progress. You may need blood work done while you are taking this medicine. Your condition will be monitored carefully while you are receiving this medicine. It is important not to miss any appointments. What side effects may I notice from receiving this medication? Side effects that you should report to your doctor or health care professional as soon as possible: allergic reactions (skin rash, itching or hives; swelling of the face, lips, or tongue) bleeding (bloody or black, tarry stools; red or dark brown urine; spitting up blood or brown material that looks like coffee grounds; red spots on the skin; unusual bruising or bleeding from the eyes, gums, or nose) blood clot (chest pain; shortness of breath; pain, swelling, or warmth in the leg) stroke (changes in vision; confusion; trouble speaking or understanding; severe headaches; sudden numbness or weakness of the face, arm or leg; trouble walking; dizziness; loss of balance or coordination) Side effects that usually do not require medical attention (report to your doctor or health care professional if they continue or are bothersome): diarrhea dizziness headache joint pain muscle pain stomach pain trouble sleeping This list may not describe all possible side effects. Call your doctor for medical advice about side effects. You may report side effects to FDA at 1-800-FDA-1088. Where should I keep my medication? This medicine is given in a hospital or clinic. It will not be stored at home. NOTE: This sheet is a summary. It may not cover all possible information. If you have questions about this medicine, talk to your doctor, pharmacist, or health care provider.    2022 Elsevier/Gold Standard (2019-04-04 10:28:13)  

## 2020-10-30 DIAGNOSIS — H353221 Exudative age-related macular degeneration, left eye, with active choroidal neovascularization: Secondary | ICD-10-CM | POA: Diagnosis not present

## 2020-10-30 DIAGNOSIS — H35373 Puckering of macula, bilateral: Secondary | ICD-10-CM | POA: Diagnosis not present

## 2020-11-02 ENCOUNTER — Other Ambulatory Visit: Payer: Self-pay | Admitting: Nurse Practitioner

## 2020-11-02 DIAGNOSIS — Z85038 Personal history of other malignant neoplasm of large intestine: Secondary | ICD-10-CM

## 2020-11-02 DIAGNOSIS — D693 Immune thrombocytopenic purpura: Secondary | ICD-10-CM

## 2020-11-06 ENCOUNTER — Telehealth: Payer: Self-pay

## 2020-11-06 NOTE — Telephone Encounter (Signed)
-----   Message from Owens Shark, NP sent at 11/02/2020  2:22 PM EDT ----- Please let [name] and his wife know I tried to place the order for the virtual colonoscopy but a waiver is required.  However they like to proceed?

## 2020-11-06 NOTE — Telephone Encounter (Signed)
Called spoke with pt who decided to hold on virtual colonoscopy at this time

## 2020-11-07 ENCOUNTER — Inpatient Hospital Stay: Payer: Medicare Other

## 2020-11-07 ENCOUNTER — Other Ambulatory Visit: Payer: Self-pay

## 2020-11-07 ENCOUNTER — Inpatient Hospital Stay: Payer: Medicare Other | Attending: Nurse Practitioner

## 2020-11-07 VITALS — BP 140/77 | HR 58 | Temp 98.1°F | Resp 18 | Wt 163.4 lb

## 2020-11-07 DIAGNOSIS — D693 Immune thrombocytopenic purpura: Secondary | ICD-10-CM | POA: Insufficient documentation

## 2020-11-07 LAB — CBC WITH DIFFERENTIAL (CANCER CENTER ONLY)
Abs Immature Granulocytes: 0.08 10*3/uL — ABNORMAL HIGH (ref 0.00–0.07)
Basophils Absolute: 0.1 10*3/uL (ref 0.0–0.1)
Basophils Relative: 2 %
Eosinophils Absolute: 0.2 10*3/uL (ref 0.0–0.5)
Eosinophils Relative: 2 %
HCT: 36.8 % — ABNORMAL LOW (ref 39.0–52.0)
Hemoglobin: 11.8 g/dL — ABNORMAL LOW (ref 13.0–17.0)
Immature Granulocytes: 1 %
Lymphocytes Relative: 15 %
Lymphs Abs: 1.1 10*3/uL (ref 0.7–4.0)
MCH: 27.3 pg (ref 26.0–34.0)
MCHC: 32.1 g/dL (ref 30.0–36.0)
MCV: 85.2 fL (ref 80.0–100.0)
Monocytes Absolute: 1.3 10*3/uL — ABNORMAL HIGH (ref 0.1–1.0)
Monocytes Relative: 18 %
Neutro Abs: 4.5 10*3/uL (ref 1.7–7.7)
Neutrophils Relative %: 62 %
Platelet Count: 98 10*3/uL — ABNORMAL LOW (ref 150–400)
RBC: 4.32 MIL/uL (ref 4.22–5.81)
RDW: 16.4 % — ABNORMAL HIGH (ref 11.5–15.5)
WBC Count: 7.2 10*3/uL (ref 4.0–10.5)
nRBC: 0 % (ref 0.0–0.2)

## 2020-11-07 MED ORDER — ROMIPLOSTIM 125 MCG ~~LOC~~ SOLR
1.0000 ug/kg | Freq: Once | SUBCUTANEOUS | Status: AC
  Administered 2020-11-07: 75 ug via SUBCUTANEOUS
  Filled 2020-11-07: qty 0.15

## 2020-11-07 NOTE — Patient Instructions (Signed)
Romiplostim injection What is this medication? ROMIPLOSTIM (roe mi PLOE stim) helps your body make more platelets. This medicine is used to treat low platelets caused by chronic idiopathic thrombocytopenic purpura (ITP) or a bone marrow syndrome caused by radiation sickness. This medicine may be used for other purposes; ask your health care provider or pharmacist if you have questions. COMMON BRAND NAME(S): Nplate What should I tell my care team before I take this medication? They need to know if you have any of these conditions: blood clots myelodysplastic syndrome an unusual or allergic reaction to romiplostim, mannitol, other medicines, foods, dyes, or preservatives pregnant or trying to get pregnant breast-feeding How should I use this medication? This medicine is injected under the skin. It is given by a health care provider in a hospital or clinic setting. A special MedGuide will be given to you before each treatment. Be sure to read this information carefully each time. Talk to your health care provider about the use of this medicine in children. While it may be prescribed for children as young as newborns for selected conditions, precautions do apply. Overdosage: If you think you have taken too much of this medicine contact a poison control center or emergency room at once. NOTE: This medicine is only for you. Do not share this medicine with others. What if I miss a dose? Keep appointments for follow-up doses. It is important not to miss your dose. Call your health care provider if you are unable to keep an appointment. What may interact with this medication? Interactions are not expected. This list may not describe all possible interactions. Give your health care provider a list of all the medicines, herbs, non-prescription drugs, or dietary supplements you use. Also tell them if you smoke, drink alcohol, or use illegal drugs. Some items may interact with your medicine. What should I  watch for while using this medication? Visit your health care provider for regular checks on your progress. You may need blood work done while you are taking this medicine. Your condition will be monitored carefully while you are receiving this medicine. It is important not to miss any appointments. What side effects may I notice from receiving this medication? Side effects that you should report to your doctor or health care professional as soon as possible: allergic reactions (skin rash, itching or hives; swelling of the face, lips, or tongue) bleeding (bloody or black, tarry stools; red or dark brown urine; spitting up blood or brown material that looks like coffee grounds; red spots on the skin; unusual bruising or bleeding from the eyes, gums, or nose) blood clot (chest pain; shortness of breath; pain, swelling, or warmth in the leg) stroke (changes in vision; confusion; trouble speaking or understanding; severe headaches; sudden numbness or weakness of the face, arm or leg; trouble walking; dizziness; loss of balance or coordination) Side effects that usually do not require medical attention (report to your doctor or health care professional if they continue or are bothersome): diarrhea dizziness headache joint pain muscle pain stomach pain trouble sleeping This list may not describe all possible side effects. Call your doctor for medical advice about side effects. You may report side effects to FDA at 1-800-FDA-1088. Where should I keep my medication? This medicine is given in a hospital or clinic. It will not be stored at home. NOTE: This sheet is a summary. It may not cover all possible information. If you have questions about this medicine, talk to your doctor, pharmacist, or health care provider.    2022 Elsevier/Gold Standard (2019-04-04 10:28:13)  

## 2020-11-09 DIAGNOSIS — Z8673 Personal history of transient ischemic attack (TIA), and cerebral infarction without residual deficits: Secondary | ICD-10-CM | POA: Diagnosis not present

## 2020-11-09 DIAGNOSIS — H9193 Unspecified hearing loss, bilateral: Secondary | ICD-10-CM | POA: Diagnosis not present

## 2020-11-09 DIAGNOSIS — I2510 Atherosclerotic heart disease of native coronary artery without angina pectoris: Secondary | ICD-10-CM | POA: Diagnosis not present

## 2020-11-09 DIAGNOSIS — I10 Essential (primary) hypertension: Secondary | ICD-10-CM | POA: Diagnosis not present

## 2020-11-09 DIAGNOSIS — I480 Paroxysmal atrial fibrillation: Secondary | ICD-10-CM | POA: Diagnosis not present

## 2020-11-09 DIAGNOSIS — R2681 Unsteadiness on feet: Secondary | ICD-10-CM | POA: Diagnosis not present

## 2020-11-13 ENCOUNTER — Other Ambulatory Visit: Payer: Self-pay

## 2020-11-13 ENCOUNTER — Other Ambulatory Visit: Payer: Self-pay | Admitting: Interventional Cardiology

## 2020-11-13 ENCOUNTER — Inpatient Hospital Stay: Payer: Medicare Other

## 2020-11-13 ENCOUNTER — Other Ambulatory Visit: Payer: Self-pay | Admitting: Physician Assistant

## 2020-11-13 VITALS — BP 148/73 | HR 54 | Temp 98.0°F | Resp 18 | Ht 64.0 in | Wt 163.4 lb

## 2020-11-13 DIAGNOSIS — D693 Immune thrombocytopenic purpura: Secondary | ICD-10-CM | POA: Diagnosis not present

## 2020-11-13 LAB — CBC WITH DIFFERENTIAL (CANCER CENTER ONLY)
Abs Immature Granulocytes: 0.14 10*3/uL — ABNORMAL HIGH (ref 0.00–0.07)
Basophils Absolute: 0.1 10*3/uL (ref 0.0–0.1)
Basophils Relative: 1 %
Eosinophils Absolute: 0.2 10*3/uL (ref 0.0–0.5)
Eosinophils Relative: 3 %
HCT: 36.3 % — ABNORMAL LOW (ref 39.0–52.0)
Hemoglobin: 11.7 g/dL — ABNORMAL LOW (ref 13.0–17.0)
Immature Granulocytes: 2 %
Lymphocytes Relative: 18 %
Lymphs Abs: 1.4 10*3/uL (ref 0.7–4.0)
MCH: 27.3 pg (ref 26.0–34.0)
MCHC: 32.2 g/dL (ref 30.0–36.0)
MCV: 84.8 fL (ref 80.0–100.0)
Monocytes Absolute: 1.7 10*3/uL — ABNORMAL HIGH (ref 0.1–1.0)
Monocytes Relative: 21 %
Neutro Abs: 4.3 10*3/uL (ref 1.7–7.7)
Neutrophils Relative %: 55 %
Platelet Count: 46 10*3/uL — ABNORMAL LOW (ref 150–400)
RBC: 4.28 MIL/uL (ref 4.22–5.81)
RDW: 16.3 % — ABNORMAL HIGH (ref 11.5–15.5)
WBC Count: 7.9 10*3/uL (ref 4.0–10.5)
nRBC: 0 % (ref 0.0–0.2)

## 2020-11-13 MED ORDER — ROMIPLOSTIM 250 MCG ~~LOC~~ SOLR
2.0000 ug/kg | Freq: Once | SUBCUTANEOUS | Status: AC
  Administered 2020-11-13: 150 ug via SUBCUTANEOUS
  Filled 2020-11-13: qty 0.3

## 2020-11-13 NOTE — Telephone Encounter (Signed)
Pt's pharmacy is requesting a refill on furosemide 40 mg tablet. Dr Tamala Julian D/C furosemide, but the medication was put back on pt's medication list. Would Dr. Tamala Julian like to refill this medication? Please address

## 2020-11-13 NOTE — Telephone Encounter (Signed)
Left message to call back.  Need to clarify if pt is taking Furosemide or not.

## 2020-11-13 NOTE — Patient Instructions (Signed)
Romiplostim injection What is this medication? ROMIPLOSTIM (roe mi PLOE stim) helps your body make more platelets. This medicine is used to treat low platelets caused by chronic idiopathic thrombocytopenic purpura (ITP) or a bone marrow syndrome caused by radiation sickness. This medicine may be used for other purposes; ask your health care provider or pharmacist if you have questions. COMMON BRAND NAME(S): Nplate What should I tell my care team before I take this medication? They need to know if you have any of these conditions: blood clots myelodysplastic syndrome an unusual or allergic reaction to romiplostim, mannitol, other medicines, foods, dyes, or preservatives pregnant or trying to get pregnant breast-feeding How should I use this medication? This medicine is injected under the skin. It is given by a health care provider in a hospital or clinic setting. A special MedGuide will be given to you before each treatment. Be sure to read this information carefully each time. Talk to your health care provider about the use of this medicine in children. While it may be prescribed for children as young as newborns for selected conditions, precautions do apply. Overdosage: If you think you have taken too much of this medicine contact a poison control center or emergency room at once. NOTE: This medicine is only for you. Do not share this medicine with others. What if I miss a dose? Keep appointments for follow-up doses. It is important not to miss your dose. Call your health care provider if you are unable to keep an appointment. What may interact with this medication? Interactions are not expected. This list may not describe all possible interactions. Give your health care provider a list of all the medicines, herbs, non-prescription drugs, or dietary supplements you use. Also tell them if you smoke, drink alcohol, or use illegal drugs. Some items may interact with your medicine. What should I  watch for while using this medication? Visit your health care provider for regular checks on your progress. You may need blood work done while you are taking this medicine. Your condition will be monitored carefully while you are receiving this medicine. It is important not to miss any appointments. What side effects may I notice from receiving this medication? Side effects that you should report to your doctor or health care professional as soon as possible: allergic reactions (skin rash, itching or hives; swelling of the face, lips, or tongue) bleeding (bloody or black, tarry stools; red or dark brown urine; spitting up blood or brown material that looks like coffee grounds; red spots on the skin; unusual bruising or bleeding from the eyes, gums, or nose) blood clot (chest pain; shortness of breath; pain, swelling, or warmth in the leg) stroke (changes in vision; confusion; trouble speaking or understanding; severe headaches; sudden numbness or weakness of the face, arm or leg; trouble walking; dizziness; loss of balance or coordination) Side effects that usually do not require medical attention (report to your doctor or health care professional if they continue or are bothersome): diarrhea dizziness headache joint pain muscle pain stomach pain trouble sleeping This list may not describe all possible side effects. Call your doctor for medical advice about side effects. You may report side effects to FDA at 1-800-FDA-1088. Where should I keep my medication? This medicine is given in a hospital or clinic. It will not be stored at home. NOTE: This sheet is a summary. It may not cover all possible information. If you have questions about this medicine, talk to your doctor, pharmacist, or health care provider.    2022 Elsevier/Gold Standard (2019-04-04 10:28:13)  

## 2020-11-14 NOTE — Telephone Encounter (Signed)
Spoke with pt and his wife and pt is no longer taking this medication.  Advised I will send message to the pharmacy with a denial for refill.  Pt appreciative for call.

## 2020-11-19 ENCOUNTER — Inpatient Hospital Stay: Payer: Medicare Other

## 2020-11-19 ENCOUNTER — Inpatient Hospital Stay: Payer: Medicare Other | Admitting: Oncology

## 2020-11-21 ENCOUNTER — Inpatient Hospital Stay: Payer: Medicare Other

## 2020-11-21 ENCOUNTER — Encounter: Payer: Self-pay | Admitting: Nurse Practitioner

## 2020-11-21 ENCOUNTER — Other Ambulatory Visit: Payer: Self-pay

## 2020-11-21 ENCOUNTER — Inpatient Hospital Stay (HOSPITAL_BASED_OUTPATIENT_CLINIC_OR_DEPARTMENT_OTHER): Payer: Medicare Other | Admitting: Nurse Practitioner

## 2020-11-21 VITALS — BP 123/60 | HR 92 | Temp 98.1°F | Resp 20 | Ht 64.0 in | Wt 163.0 lb

## 2020-11-21 DIAGNOSIS — K921 Melena: Secondary | ICD-10-CM | POA: Diagnosis not present

## 2020-11-21 DIAGNOSIS — D693 Immune thrombocytopenic purpura: Secondary | ICD-10-CM

## 2020-11-21 DIAGNOSIS — Z08 Encounter for follow-up examination after completed treatment for malignant neoplasm: Secondary | ICD-10-CM | POA: Diagnosis not present

## 2020-11-21 LAB — CBC WITH DIFFERENTIAL (CANCER CENTER ONLY)
Abs Immature Granulocytes: 0.22 10*3/uL — ABNORMAL HIGH (ref 0.00–0.07)
Basophils Absolute: 0.1 10*3/uL (ref 0.0–0.1)
Basophils Relative: 1 %
Eosinophils Absolute: 0.2 10*3/uL (ref 0.0–0.5)
Eosinophils Relative: 2 %
HCT: 36.2 % — ABNORMAL LOW (ref 39.0–52.0)
Hemoglobin: 11.6 g/dL — ABNORMAL LOW (ref 13.0–17.0)
Immature Granulocytes: 3 %
Lymphocytes Relative: 15 %
Lymphs Abs: 1.3 10*3/uL (ref 0.7–4.0)
MCH: 27.6 pg (ref 26.0–34.0)
MCHC: 32 g/dL (ref 30.0–36.0)
MCV: 86.2 fL (ref 80.0–100.0)
Monocytes Absolute: 1.7 10*3/uL — ABNORMAL HIGH (ref 0.1–1.0)
Monocytes Relative: 18 %
Neutro Abs: 5.4 10*3/uL (ref 1.7–7.7)
Neutrophils Relative %: 61 %
Platelet Count: 243 10*3/uL (ref 150–400)
RBC: 4.2 MIL/uL — ABNORMAL LOW (ref 4.22–5.81)
RDW: 16.2 % — ABNORMAL HIGH (ref 11.5–15.5)
WBC Count: 9 10*3/uL (ref 4.0–10.5)
nRBC: 0 % (ref 0.0–0.2)

## 2020-11-21 MED ORDER — ROMIPLOSTIM 125 MCG ~~LOC~~ SOLR
1.0000 ug/kg | Freq: Once | SUBCUTANEOUS | Status: AC
  Administered 2020-11-21: 75 ug via SUBCUTANEOUS
  Filled 2020-11-21: qty 0.15

## 2020-11-21 NOTE — Progress Notes (Signed)
  [facility] OFFICE PROGRESS NOTE   Diagnosis: ITP  INTERVAL HISTORY:   Mr. Uppal returns as scheduled.  He continues weekly Nplate.  He denies bleeding.  He reports discontinuing oral iron following his last office visit due to "digestive issues".  Objective:  Vital signs in last 24 hours:  Blood pressure 123/60, pulse 92, temperature 98.1 F (36.7 C), temperature source Oral, resp. rate 20, height $RemoveBe'5\' 4"'TMyEvhnWb$  (1.626 m), weight 163 lb (73.9 kg), SpO2 98 %.    Resp: Lungs clear bilaterally. Cardio: Regular rate and rhythm. GI: No hepatomegaly. Vascular: Trace bilateral ankle edema. Skin: Ecchymoses scattered over forearms.   Lab Results:  Lab Results  Component Value Date   WBC 9.0 11/21/2020   HGB 11.6 (L) 11/21/2020   HCT 36.2 (L) 11/21/2020   MCV 86.2 11/21/2020   PLT 243 11/21/2020   NEUTROABS 5.4 11/21/2020    Imaging:  No results found.  Medications: I have reviewed the patient's current medications.  Assessment/Plan: Thrombocytopenia Bone marrow biopsy 07/08/2016-no evidence of B-cell lymphoma, 40% cellular bone marrow with trilineage hematopoiesis, megakaryocytes present with normal morphology, normal cytogenetics, negative for BRAF mutation Flow cytometry 01/05/2009-no monoclonal B-cell or phenotypically abnormal T-cell population Prednisone starting 06/29/2019 Nplate 07/04/2019, 7/41/6384, Nplate 07/18/2019 Prednisone 20 mg daily beginning 07/25/2019 Platelets 11,000 on 08/15/2019, prednisone increased to 40 mg daily, Nplate resume 5/36/4680  Prednisone decreased to 30 mg daily 08/25/2019, Nplate continued Prednisone continued at 30 mg daily 09/13/2019, weekly Nplate continued Prednisone taper to 20 mg daily 09/23/2019, weekly Nplate continued Prednisone taper to 10 mg daily 09/30/2019, weekly Nplate continued Prednisone taper to 5 mg daily 10/21/2019, weekly Nplate continued Prednisone taper to 5 mg every other day for 5 doses then stop 11/11/2019, weekly  Nplate continued Nplate last given 05/22/2246 Promacta cost prohibitive Nplate beginning 04/08/35, held 08/27/2020 and 09/03/2020 due to patient vacation Nplate resumed 0/48/8891 Nplate held 08/09/4501 secondary to an elevated platelet count Nplate resumed with dose reduction to 1 mcg/kg 10/09/2020 Nplate dose decreased to 0.5 mcg/kg 10/29/2020 Hairy cell leukemia 1982, status post splenectomy Coronary artery disease Aortic stenosis Liposarcoma at the right chest wall resected in 2013 Macular degeneration Hearing loss Basal cell carcinoma left cheek 05/24/2019 Left upper lobe nodule, faint FDG activity on PET at [facility] 05/31/2013 Status post TAVR procedure 09/06/2019 Admission with Pseudomonas urosepsis 09/16/2019 Acute left MCA distribution CVA 09/16/2019 Covid January 2022 Mild anemia, 1 of 3 stool Hemoccult cards positive for occult blood 10/14/2020      Disposition: Mr. Cleavenger appears unchanged.  Review of CBC from today shows platelet count 243,000.  Plan to continue weekly Nplate, adjust dose as indicated.  We have referred him for a virtual colonoscopy due to heme positive stool and anemia.  We will see him in follow-up in 4 weeks.    [name] ANP/GNP-BC   11/21/2020  1:53 PM

## 2020-11-21 NOTE — Patient Instructions (Signed)
Romiplostim injection What is this medication? ROMIPLOSTIM (roe mi PLOE stim) helps your body make more platelets. This medicine is used to treat low platelets caused by chronic idiopathic thrombocytopenic purpura (ITP) or a bone marrow syndrome caused by radiation sickness. This medicine may be used for other purposes; ask your health care provider or pharmacist if you have questions. COMMON BRAND NAME(S): Nplate What should I tell my care team before I take this medication? They need to know if you have any of these conditions: blood clots myelodysplastic syndrome an unusual or allergic reaction to romiplostim, mannitol, other medicines, foods, dyes, or preservatives pregnant or trying to get pregnant breast-feeding How should I use this medication? This medicine is injected under the skin. It is given by a health care provider in a hospital or clinic setting. A special MedGuide will be given to you before each treatment. Be sure to read this information carefully each time. Talk to your health care provider about the use of this medicine in children. While it may be prescribed for children as young as newborns for selected conditions, precautions do apply. Overdosage: If you think you have taken too much of this medicine contact a poison control center or emergency room at once. NOTE: This medicine is only for you. Do not share this medicine with others. What if I miss a dose? Keep appointments for follow-up doses. It is important not to miss your dose. Call your health care provider if you are unable to keep an appointment. What may interact with this medication? Interactions are not expected. This list may not describe all possible interactions. Give your health care provider a list of all the medicines, herbs, non-prescription drugs, or dietary supplements you use. Also tell them if you smoke, drink alcohol, or use illegal drugs. Some items may interact with your medicine. What should I  watch for while using this medication? Visit your health care provider for regular checks on your progress. You may need blood work done while you are taking this medicine. Your condition will be monitored carefully while you are receiving this medicine. It is important not to miss any appointments. What side effects may I notice from receiving this medication? Side effects that you should report to your doctor or health care professional as soon as possible: allergic reactions (skin rash, itching or hives; swelling of the face, lips, or tongue) bleeding (bloody or black, tarry stools; red or dark brown urine; spitting up blood or brown material that looks like coffee grounds; red spots on the skin; unusual bruising or bleeding from the eyes, gums, or nose) blood clot (chest pain; shortness of breath; pain, swelling, or warmth in the leg) stroke (changes in vision; confusion; trouble speaking or understanding; severe headaches; sudden numbness or weakness of the face, arm or leg; trouble walking; dizziness; loss of balance or coordination) Side effects that usually do not require medical attention (report to your doctor or health care professional if they continue or are bothersome): diarrhea dizziness headache joint pain muscle pain stomach pain trouble sleeping This list may not describe all possible side effects. Call your doctor for medical advice about side effects. You may report side effects to FDA at 1-800-FDA-1088. Where should I keep my medication? This medicine is given in a hospital or clinic. It will not be stored at home. NOTE: This sheet is a summary. It may not cover all possible information. If you have questions about this medicine, talk to your doctor, pharmacist, or health care provider.    2022 Elsevier/Gold Standard (2019-04-04 10:28:13)  

## 2020-11-26 ENCOUNTER — Inpatient Hospital Stay: Payer: Medicare Other

## 2020-11-26 NOTE — Progress Notes (Addendum)
Cardiology Office Note:    Date:  11/28/2020   ID:  [name], DOB 03/02/1924, MRN [id number]  PCP:  [name], MD  Cardiologist:  Sinclair Grooms, MD   Referring MD: [name], MD   Chief Complaint  Patient presents with   Atrial Fibrillation   Cardiac Valve Problem     History of Present Illness:     [name] is a 87 y.o. male with a hx of idiopathic thrombocytopenia (Rx'd with Nplate and steroids), bifasicular block (RBBB, LAFB), anemia, anxiety, CAD, colon cancer, HLD, GERD, hairy cell leukemia, PAF found on monitor, HTN and severe aortic stenosis s/p TAVR (09/06/19), high-grade AV block that resolved without pacemaker, CVA, followed by a readmission for urosepsis/bacteremia.    He does get out and walk the block at his place of residence.  He has to stop and he is going up an incline because he gets fatigued and dyspneic.  He denies dyspnea at rest.  He denies orthopnea.  There is no peripheral edema.  He denies chest pain.  Past Medical History:  Diagnosis Date   Acute appendicitis    Anemia    Anxiety    Arthritis    "back" (03/14/2014)   Blood dyscrasia    hairy cell leukemia   CAD (coronary artery disease)    a. 03/14/14  s/p overlapping DES x2 to mid-distal RCA.   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    Colon cancer ([place]) 1984   Compression fracture of lumbar spine, non-traumatic (HCC)    DJD (degenerative joint disease) of lumbar spine    Dyslipidemia    Dyspnea    Elevated PSA    GERD (gastroesophageal reflux disease)    Glaucoma    Hairy cell leukemia ([place]) dx'd 1980   Heart murmur    History of blood transfusion    "several; related to hairy cell leukemia & tx "   History of stomach ulcers 1968   Hypertension    Leukemia, hairy cell ([place])    Malnutrition ([place])    Osteoarthritis    Osteoporosis    Pneumonia    S/P TAVR (transcatheter aortic valve replacement) 09/06/2019   s/p TAVR with a 26 mm Edwards S3U  via the TF approach by Dr. Angelena Form and Cyndia Bent.    Severe aortic stenosis    s/p tavr   Skin cancer of face     Past Surgical History:  Procedure Laterality Date   APPENDECTOMY  10/2007   BUBBLE STUDY  11/02/2019   Procedure: BUBBLE STUDY;  Surgeon: Elouise Munroe, MD;  Location: [facility] ENDOSCOPY;  Service: Cardiology;;   CATARACT EXTRACTION, BILATERAL Bilateral 02/2008   COLON SURGERY  1984   "sigmoid; open"   CORONARY ANGIOPLASTY WITH STENT PLACEMENT  03/14/2014   "2"   EYE SURGERY Bilateral    cataract   FRACTURE SURGERY     LEFT HEART CATHETERIZATION WITH CORONARY ANGIOGRAM N/A 03/14/2014   Procedure: LEFT HEART CATHETERIZATION WITH CORONARY ANGIOGRAM;  Surgeon: Peter M Martinique, MD;  Location: [facility] CATH LAB;  Service: Cardiovascular;  Laterality: N/A;   LIPOMA EXCISION Right 07/2011   liposarcoma resection; "back"   MOHS SURGERY Left 02/2011   MOHS SURGERY  X 3   "all on my face"   MOLE REMOVAL Left 1985   cheek   ORIF ANKLE FRACTURE Left 2000   RIGHT/LEFT HEART CATH AND CORONARY ANGIOGRAPHY N/A 07/26/2019   Procedure: RIGHT/LEFT HEART CATH AND CORONARY ANGIOGRAPHY;  Surgeon: Tamala Julian,  Lynnell Dike, MD;  Location: [place] CV LAB;  Service: Cardiovascular;  Laterality: N/A;   SHOULDER SURGERY  04/2004   SPLENECTOMY  1992   TEE WITHOUT CARDIOVERSION N/A 09/06/2019   Procedure: TRANSESOPHAGEAL ECHOCARDIOGRAM (TEE);  Surgeon: Burnell Blanks, MD;  Location: [place] CV LAB;  Service: Open Heart Surgery;  Laterality: N/A;   TEE WITHOUT CARDIOVERSION N/A 11/02/2019   Procedure: TRANSESOPHAGEAL ECHOCARDIOGRAM (TEE);  Surgeon: Elouise Munroe, MD;  Location: [facility];  Service: Cardiology;  Laterality: N/A;   TONSILLECTOMY AND ADENOIDECTOMY  1939   TRANSCATHETER AORTIC VALVE REPLACEMENT, TRANSFEMORAL N/A 09/06/2019   Procedure: TRANSCATHETER AORTIC VALVE REPLACEMENT, TRANSFEMORAL;  Surgeon: Burnell Blanks, MD;  Location: [place] CV LAB;  Service: Open Heart Surgery;   Laterality: N/A;    Current Medications: Current Meds  Medication Sig   acetaminophen (TYLENOL) 500 MG tablet Take 1,000 mg by mouth every 8 (eight) hours as needed for moderate pain.   amoxicillin (AMOXIL) 500 MG tablet Take 4 capsules (2,000 mg) one hour prior to all dental visits.   atorvastatin (LIPITOR) 20 MG tablet TAKE 1 TABLET BY MOUTH DAILY AT 6 PM   Cholecalciferol (VITAMIN D3) 50 MCG (2000 UT) TABS Take 2,000 Units by mouth every evening.   desonide (DESOWEN) 0.05 % cream Apply 1 application topically 2 (two) times daily as needed (rash/irritation.). Only takes as needed   dexamethasone (DECADRON) 0.1 % ophthalmic solution    dexamethasone (DECADRON) 0.1 % ophthalmic suspension    finasteride (PROSCAR) 5 MG tablet Take 5 mg by mouth daily.   fluocinonide (LIDEX) 0.05 % external solution Apply 1 application topically 2 (two) times daily as needed (apply to ears).    furosemide (LASIX) 40 MG tablet Take by mouth.   latanoprost (XALATAN) 0.005 % ophthalmic solution Place 1 drop into both eyes at bedtime.    metoprolol succinate (TOPROL-XL) 12.5 mg TB24 24 hr tablet Take 12.5 mg by mouth daily.   metoprolol succinate (TOPROL-XL) 25 MG 24 hr tablet Take by mouth.   metoprolol tartrate (LOPRESSOR) 12.5 mg TABS tablet Take 12.5 mg by mouth daily. Pt reported taking half a tablet daily 10/09/20   Multiple Vitamins-Minerals (PRESERVISION AREDS 2 PO) Take 1 capsule by mouth 2 (two) times daily.    Multiple Vitamins-Minerals (PRESERVISION AREDS) CAPS 1 tablet 2 (two) times daily.   mupirocin ointment (BACTROBAN) 2 % Apply to affected area   nitroGLYCERIN (NITROSTAT) 0.4 MG SL tablet PLACE AND DISSOLVE 1 TABLET UNDER THE TONGUE EVERY 5 MINUTES AS NEEDED FOR CHEST PAIN FOR UP TO 3 DOSES, CALL 911 IF NO RELIEF AFTER 1ST DOSE   pantoprazole (PROTONIX) 40 MG tablet Take 1 tablet (40 mg total) by mouth daily.   ranibizumab (LUCENTIS) 0.5 MG/0.05ML SOLN 0.5 mg by Intravitreal route every 6 (six)  weeks. Left Eye ONLY   romiPLOStim (NPLATE) 250 MCG injection Inject into the skin once a week.   sertraline (ZOLOFT) 50 MG tablet Take 50 mg by mouth every evening.    tamsulosin (FLOMAX) 0.4 MG CAPS capsule Take 0.8 mg by mouth daily after supper.    timolol (BETIMOL) 0.5 % ophthalmic solution Place 1 drop into the right eye daily.    timolol (TIMOPTIC) 0.5 % ophthalmic solution Place 1 drop into the right eye daily.     Allergies:   Antazoline; Antihistamines, chlorpheniramine-type; Calcitonin (salmon); and Sulfa antibiotics   Social History   Socioeconomic History   Marital status: Married    Spouse name: Not on  file   Number of children: Not on file   Years of education: 16   Highest education level: Bachelor's degree (e.g., BA, AB, BS)  Occupational History   Occupation: Retired  Tobacco Use   Smoking status: Former    Packs/day: 0.25    Years: 1.00    Pack years: 0.25    Types: Cigarettes, Cigars   Smokeless tobacco: Never   Tobacco comments:    occasional social smoker during college.  Vaping Use   Vaping Use: Never used  Substance and Sexual Activity   Alcohol use: Yes    Alcohol/week: 9.0 standard drinks    Types: 2 Glasses of wine, 2 Shots of liquor, 5 Standard drinks or equivalent per week    Comment: socially   Drug use: No   Sexual activity: Not Currently  Other Topics Concern   Not on file  Social History Narrative   Not on file   Social Determinants of Health   Financial Resource Strain: Not on file  Food Insecurity: No Food Insecurity   Worried About Running Out of Food in the Last Year: Never true   Ran Out of Food in the Last Year: Never true  Transportation Needs: No Transportation Needs   Lack of Transportation (Medical): No   Lack of Transportation (Non-Medical): No  Physical Activity: Not on file  Stress: Not on file  Social Connections: Not on file     Family History: The patient's family history includes Congestive Heart Failure (age  of onset: 38) in his father; Diabetes Mellitus II in his brother; Heart Problems in his brother; Prostate cancer in his brother; Stroke in his mother.  ROS:   Please see the history of present illness.  Difficulty with word finding and difficulty with hearing make communication difficult. Somewhat depressed.  Drove to [facility] yesterday.  Went out to eat dinner with some friends.  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  All other systems reviewed and are negative.  EKGs/Labs/Other Studies Reviewed:    The following studies were reviewed today: Continuous monitor 09/08/2019: Study Highlights    Sinus rhythm and sinus bradycardia predominates the rhythm Atrial flutter with variable AV conduction, 2% burden totaling 6 hrs and 45 minutes. Rare SVT < 10 beats Rare NSVT < 5 minutes   Paroxysmal A flutter with burden 2%    EKG:  EKG not performed none new today.  Recent Labs: 06/14/2020: NT-Pro BNP 559 08/20/2020: ALT 14; BUN 27; Creatinine 1.27; Potassium 4.2; Sodium 141 11/27/2020: Hemoglobin 11.3; Platelet Count 283  Recent Lipid Panel    Component Value Date/Time   CHOL 96 09/17/2019 0752   TRIG 115 09/17/2019 0752   HDL 30 (L) 09/17/2019 0752   CHOLHDL 3.2 09/17/2019 0752   VLDL 23 09/17/2019 0752   LDLCALC 43 09/17/2019 0752    Physical Exam:    VS:  BP (!) 122/58   Pulse 62   Ht 5\' 4"  (1.626 m)   Wt 164 lb 3.2 oz (74.5 kg)   SpO2 97%   BMI 28.18 kg/m     Wt Readings from Last 3 Encounters:  11/28/20 164 lb 3.2 oz (74.5 kg)  11/21/20 163 lb (73.9 kg)  11/13/20 163 lb 6 oz (74.1 kg)     GEN: Compatible with age.  Weight is stable.. No acute distress HEENT: Normal NECK: No JVD. LYMPHATICS: No lymphadenopathy CARDIAC: No murmur. RRR no gallop, or edema. VASCULAR:  Normal Pulses. No bruits. RESPIRATORY:  Clear to auscultation without rales,  wheezing or rhonchi  ABDOMEN: Soft, non-tender, non-distended, No pulsatile mass, MUSCULOSKELETAL: No deformity  SKIN: Warm and  dry NEUROLOGIC:  Alert and oriented x 3 PSYCHIATRIC:  Normal affect   ASSESSMENT:    1. S/P TAVR (transcatheter aortic valve replacement)   2. Chronic diastolic CHF (congestive heart failure) ([place])   3. Essential hypertension   4. PAF (paroxysmal atrial fibrillation) ([place])   5. Conduction disorder, unspecified   6. Dyslipidemia    PLAN:    In order of problems listed above:  Clinically normal function. No volume overload on exam.  He does not use furosemide.  We will remove from his problem list. Blood pressure is relatively low for age.  Only on Toprol XL. I wonder if he is having chronotropic incompetence accounting for his exertional intolerance.  We will wear a 3-day monitor to gauge heart rate response with physical activity.  I have encouraged him to be as active as possible. Monitor for chronotropic incompetence Continue Lipitor 20 mg/day.   Medication Adjustments/Labs and Tests Ordered: Current medicines are reviewed at length with the patient today.  Concerns regarding medicines are outlined above.  Orders Placed This Encounter  Procedures   LONG TERM MONITOR (3-14 DAYS)    No orders of the defined types were placed in this encounter.   Patient Instructions  Medication Instructions:  Your physician recommends that you continue on your current medications as directed. Please refer to the Current Medication list given to you today.  *If you need a refill on your cardiac medications before your next appointment, please call your pharmacy*   Lab Work: None If you have labs (blood work) drawn today and your tests are completely normal, you will receive your results only by: Outlook (if you have MyChart) OR A paper copy in the mail If you have any lab test that is abnormal or we need to change your treatment, we will call you to review the results.   Testing/Procedures: Your physician recommends that you wear a monitor for 3 days.  Please see below for  instructions.   Follow-Up: At [facility], you and your health needs are our priority.  As part of our continuing mission to provide you with exceptional heart care, we have created designated Provider Care Teams.  These Care Teams include your primary Cardiologist (physician) and Advanced Practice Providers (APPs -  Physician Assistants and Nurse Practitioners) who all work together to provide you with the care you need, when you need it.  We recommend signing up for the patient portal called "MyChart".  Sign up information is provided on this After Visit Summary.  MyChart is used to connect with patients for Virtual Visits (Telemedicine).  Patients are able to view lab/test results, encounter notes, upcoming appointments, etc.  Non-urgent messages can be sent to your provider as well.   To learn more about what you can do with MyChart, go to [email or web address].    Your next appointment:   6 month(s)  The format for your next appointment:   In Person  Provider:   You may see Sinclair Grooms, MD or one of the following Advanced Practice Providers on your designated Care Team:   Cecilie Kicks, NP   Other Instructions  [place] Monitor Instructions  Your physician has requested you wear a ZIO patch monitor for 3 days.  This is a single patch monitor. [employer] supplies one patch monitor per enrollment. Additional stickers are not available. Please do not  apply patch if you will be having a Nuclear Stress Test,  Echocardiogram, Cardiac CT, MRI, or Chest Xray during the period you would be wearing the  monitor. The patch cannot be worn during these tests. You cannot remove and re-apply the  ZIO XT patch monitor.  Your ZIO patch monitor will be mailed 3 day USPS to your address on file. It may take 3-5 days  to receive your monitor after you have been enrolled.  Once you have received your monitor, please review the enclosed instructions. Your monitor  has already been  registered assigning a specific monitor serial # to you.  Billing and Patient Assistance Program Information  We have supplied [employer] with any of your insurance information on file for billing purposes. [employer] offers a sliding scale Patient Assistance Program for patients that do not have  insurance, or whose insurance does not completely cover the cost of the ZIO monitor.  You must apply for the Patient Assistance Program to qualify for this discounted rate.  To apply, please call [employer] at [phone], select option 4, select option 2, ask to apply for  Patient Assistance Program. Theodore Demark will ask your household income, and how many people  are in your household. They will quote your out-of-pocket cost based on that information.  [employer] will also be able to set up a 79-month, interest-free payment plan if needed.  Applying the monitor   Shave hair from upper left chest.  Hold abrader disc by orange tab. Rub abrader in 40 strokes over the upper left chest as  indicated in your monitor instructions.  Clean area with 4 enclosed alcohol pads. Let dry.  Apply patch as indicated in monitor instructions. Patch will be placed under collarbone on left  side of chest with arrow pointing upward.  Rub patch adhesive wings for 2 minutes. Remove white label marked "1". Remove the white  label marked "2". Rub patch adhesive wings for 2 additional minutes.  While looking in a mirror, press and release button in center of patch. A small green light will  flash 3-4 times. This will be your only indicator that the monitor has been turned on.  Do not shower for the first 24 hours. You may shower after the first 24 hours.  Press the button if you feel a symptom. You will hear a small click. Record Date, Time and  Symptom in the Patient Logbook.  When you are ready to remove the patch, follow instructions on the last 2 pages of Patient  Logbook. Stick patch monitor onto the last page of Patient  Logbook.  Place Patient Logbook in the blue and white box. Use locking tab on box and tape box closed  securely. The blue and white box has prepaid postage on it. Please place it in the mailbox as  soon as possible. Your physician should have your test results approximately 7 days after the  monitor has been mailed back to [facility].  Call [facility] at [phone] if you have questions regarding  your ZIO XT patch monitor. Call them immediately if you see an orange light blinking on your  monitor.  If your monitor falls off in less than 4 days, contact our Monitor department at [phone].  If your monitor becomes loose or falls off after 4 days call [employer] at [phone] for  suggestions on securing your monitor   Signed, Sinclair Grooms, MD  11/28/2020 8:28 AM    [place]

## 2020-11-27 ENCOUNTER — Other Ambulatory Visit: Payer: Self-pay

## 2020-11-27 ENCOUNTER — Inpatient Hospital Stay: Payer: Medicare Other

## 2020-11-27 VITALS — BP 126/66 | HR 59 | Temp 98.0°F | Resp 20

## 2020-11-27 DIAGNOSIS — D693 Immune thrombocytopenic purpura: Secondary | ICD-10-CM

## 2020-11-27 LAB — CBC WITH DIFFERENTIAL (CANCER CENTER ONLY)
Abs Immature Granulocytes: 0.19 10*3/uL — ABNORMAL HIGH (ref 0.00–0.07)
Basophils Absolute: 0.1 10*3/uL (ref 0.0–0.1)
Basophils Relative: 2 %
Eosinophils Absolute: 0.2 10*3/uL (ref 0.0–0.5)
Eosinophils Relative: 3 %
HCT: 35 % — ABNORMAL LOW (ref 39.0–52.0)
Hemoglobin: 11.3 g/dL — ABNORMAL LOW (ref 13.0–17.0)
Immature Granulocytes: 3 %
Lymphocytes Relative: 17 %
Lymphs Abs: 1.3 10*3/uL (ref 0.7–4.0)
MCH: 27.7 pg (ref 26.0–34.0)
MCHC: 32.3 g/dL (ref 30.0–36.0)
MCV: 85.8 fL (ref 80.0–100.0)
Monocytes Absolute: 1.3 10*3/uL — ABNORMAL HIGH (ref 0.1–1.0)
Monocytes Relative: 17 %
Neutro Abs: 4.6 10*3/uL (ref 1.7–7.7)
Neutrophils Relative %: 58 %
Platelet Count: 283 10*3/uL (ref 150–400)
RBC: 4.08 MIL/uL — ABNORMAL LOW (ref 4.22–5.81)
RDW: 16 % — ABNORMAL HIGH (ref 11.5–15.5)
WBC Count: 7.8 10*3/uL (ref 4.0–10.5)
nRBC: 0 % (ref 0.0–0.2)

## 2020-11-27 MED ORDER — ROMIPLOSTIM 125 MCG ~~LOC~~ SOLR
1.0000 ug/kg | Freq: Once | SUBCUTANEOUS | Status: AC
  Administered 2020-11-27: 75 ug via SUBCUTANEOUS
  Filled 2020-11-27: qty 0.15

## 2020-11-27 NOTE — Patient Instructions (Signed)
[facility]  Discharge Instructions: Thank you for choosing [facility] to provide your oncology and hematology care.   If you have a lab appointment with the [facility], please go directly to the [facility] and check in at the registration area.   Wear comfortable clothing and clothing appropriate for easy access to any Portacath or PICC line.   We strive to give you quality time with your provider. You may need to reschedule your appointment if you arrive late (15 or more minutes).  Arriving late affects you and other patients whose appointments are after yours.  Also, if you miss three or more appointments without notifying the office, you may be dismissed from the clinic at the provider's discretion.      For prescription refill requests, have your pharmacy contact our office and allow 72 hours for refills to be completed.    Today you received Nplate  Romiplostim injection What is this medication? ROMIPLOSTIM (roe mi PLOE stim) helps your body make more platelets. This medicine is used to treat low platelets caused by chronic idiopathic thrombocytopenic purpura (ITP) or a bone marrow syndrome caused by radiation sickness. This medicine may be used for other purposes; ask your health care provider or pharmacist if you have questions. COMMON BRAND NAME(S): Nplate What should I tell my care team before I take this medication? They need to know if you have any of these conditions: blood clots myelodysplastic syndrome an unusual or allergic reaction to romiplostim, mannitol, other medicines, foods, dyes, or preservatives pregnant or trying to get pregnant breast-feeding How should I use this medication? This medicine is injected under the skin. It is given by a health care provider in a hospital or clinic setting. A special MedGuide will be given to you before each treatment. Be sure to read this information carefully each time. Talk to your  health care provider about the use of this medicine in children. While it may be prescribed for children as young as newborns for selected conditions, precautions do apply. Overdosage: If you think you have taken too much of this medicine contact a poison control center or emergency room at once. NOTE: This medicine is only for you. Do not share this medicine with others. What if I miss a dose? Keep appointments for follow-up doses. It is important not to miss your dose. Call your health care provider if you are unable to keep an appointment. What may interact with this medication? Interactions are not expected. This list may not describe all possible interactions. Give your health care provider a list of all the medicines, herbs, non-prescription drugs, or dietary supplements you use. Also tell them if you smoke, drink alcohol, or use illegal drugs. Some items may interact with your medicine. What should I watch for while using this medication? Visit your health care provider for regular checks on your progress. You may need blood work done while you are taking this medicine. Your condition will be monitored carefully while you are receiving this medicine. It is important not to miss any appointments. What side effects may I notice from receiving this medication? Side effects that you should report to your doctor or health care professional as soon as possible: allergic reactions (skin rash, itching or hives; swelling of the face, lips, or tongue) bleeding (bloody or black, tarry stools; red or dark brown urine; spitting up blood or brown material that looks like coffee grounds; red spots on the skin; unusual bruising  or bleeding from the eyes, gums, or nose) blood clot (chest pain; shortness of breath; pain, swelling, or warmth in the leg) stroke (changes in vision; confusion; trouble speaking or understanding; severe headaches; sudden numbness or weakness of the face, arm or leg; trouble walking;  dizziness; loss of balance or coordination) Side effects that usually do not require medical attention (report to your doctor or health care professional if they continue or are bothersome): diarrhea dizziness headache joint pain muscle pain stomach pain trouble sleeping This list may not describe all possible side effects. Call your doctor for medical advice about side effects. You may report side effects to FDA at 1-800-FDA-1088. Where should I keep my medication? This medicine is given in a hospital or clinic. It will not be stored at home. NOTE: This sheet is a summary. It may not cover all possible information. If you have questions about this medicine, talk to your doctor, pharmacist, or health care provider.  2022 Elsevier/Gold Standard (2019-04-04 10:28:13)    To help prevent nausea and vomiting after your treatment, we encourage you to take your nausea medication as directed.  BELOW ARE SYMPTOMS THAT SHOULD BE REPORTED IMMEDIATELY: *FEVER GREATER THAN 100.4 F (38 C) OR HIGHER *CHILLS OR SWEATING *NAUSEA AND VOMITING THAT IS NOT CONTROLLED WITH YOUR NAUSEA MEDICATION *UNUSUAL SHORTNESS OF BREATH *UNUSUAL BRUISING OR BLEEDING *URINARY PROBLEMS (pain or burning when urinating, or frequent urination) *BOWEL PROBLEMS (unusual diarrhea, constipation, pain near the anus) TENDERNESS IN MOUTH AND THROAT WITH OR WITHOUT PRESENCE OF ULCERS (sore throat, sores in mouth, or a toothache) UNUSUAL RASH, SWELLING OR PAIN  UNUSUAL VAGINAL DISCHARGE OR ITCHING   Items with * indicate a potential emergency and should be followed up as soon as possible or go to the Emergency Department if any problems should occur.  Please show the CHEMOTHERAPY ALERT CARD or IMMUNOTHERAPY ALERT CARD at check-in to the Emergency Department and triage nurse.  Should you have questions after your visit or need to cancel or reschedule your appointment, please contact [facility]  Dept:  [phone]  and follow the prompts.  Office hours are 8:00 a.m. to 4:30 p.m. Monday - Friday. Please note that voicemails left after 4:00 p.m. may not be returned until the following business day.  We are closed weekends and major holidays. You have access to a nurse at all times for urgent questions. Please call the main number to the clinic Dept: [phone] and follow the prompts.   For any non-urgent questions, you may also contact your provider using MyChart. We now offer e-Visits for anyone 62 and older to request care online for non-urgent symptoms. For details visit [email or web address].   Also download the MyChart app! Go to the app store, search "MyChart", open the app, select [facility], and log in with your MyChart username and password.  Due to Covid, a mask is required upon entering the hospital/clinic. If you do not have a mask, one will be given to you upon arrival. For doctor visits, patients may have 1 support person aged 41 or older with them. For treatment visits, patients cannot have anyone with them due to current Covid guidelines and our immunocompromised population.

## 2020-11-27 NOTE — Progress Notes (Signed)
Platelets today are 283. Were 243 last week. Will maintain 2mcg/kg dose today per NP.   Marland Kitchen

## 2020-11-28 ENCOUNTER — Ambulatory Visit (INDEPENDENT_AMBULATORY_CARE_PROVIDER_SITE_OTHER): Payer: Medicare Other | Admitting: Interventional Cardiology

## 2020-11-28 ENCOUNTER — Encounter: Payer: Self-pay | Admitting: Interventional Cardiology

## 2020-11-28 ENCOUNTER — Ambulatory Visit (INDEPENDENT_AMBULATORY_CARE_PROVIDER_SITE_OTHER): Payer: Medicare Other

## 2020-11-28 VITALS — BP 122/58 | HR 62 | Ht 64.0 in | Wt 164.2 lb

## 2020-11-28 DIAGNOSIS — I5032 Chronic diastolic (congestive) heart failure: Secondary | ICD-10-CM

## 2020-11-28 DIAGNOSIS — E785 Hyperlipidemia, unspecified: Secondary | ICD-10-CM | POA: Diagnosis not present

## 2020-11-28 DIAGNOSIS — I459 Conduction disorder, unspecified: Secondary | ICD-10-CM

## 2020-11-28 DIAGNOSIS — I480 Paroxysmal atrial fibrillation: Secondary | ICD-10-CM

## 2020-11-28 DIAGNOSIS — Z952 Presence of prosthetic heart valve: Secondary | ICD-10-CM | POA: Diagnosis not present

## 2020-11-28 DIAGNOSIS — I10 Essential (primary) hypertension: Secondary | ICD-10-CM | POA: Diagnosis not present

## 2020-11-28 NOTE — Patient Instructions (Addendum)
Medication Instructions:  Your physician recommends that you continue on your current medications as directed. Please refer to the Current Medication list given to you today.  *If you need a refill on your cardiac medications before your next appointment, please call your pharmacy*   Lab Work: None If you have labs (blood work) drawn today and your tests are completely normal, you will receive your results only by: [place] (if you have MyChart) OR A paper copy in the mail If you have any lab test that is abnormal or we need to change your treatment, we will call you to review the results.   Testing/Procedures: Your physician recommends that you wear a monitor for 3 days.  Please see below for instructions.   Follow-Up: At [facility], you and your health needs are our priority.  As part of our continuing mission to provide you with exceptional heart care, we have created designated Provider Care Teams.  These Care Teams include your primary Cardiologist (physician) and Advanced Practice Providers (APPs -  Physician Assistants and Nurse Practitioners) who all work together to provide you with the care you need, when you need it.  We recommend signing up for the patient portal called "MyChart".  Sign up information is provided on this After Visit Summary.  MyChart is used to connect with patients for Virtual Visits (Telemedicine).  Patients are able to view lab/test results, encounter notes, upcoming appointments, etc.  Non-urgent messages can be sent to your provider as well.   To learn more about what you can do with MyChart, go to [email or web address].    Your next appointment:   6 month(s)  The format for your next appointment:   In Person  Provider:   You may see Sinclair Grooms, MD or one of the following Advanced Practice Providers on your designated Care Team:   Cecilie Kicks, NP   Other Instructions  [place] Monitor Instructions  Your physician  has requested you wear a ZIO patch monitor for 3 days.  This is a single patch monitor. [employer] supplies one patch monitor per enrollment. Additional stickers are not available. Please do not apply patch if you will be having a Nuclear Stress Test,  Echocardiogram, Cardiac CT, MRI, or Chest Xray during the period you would be wearing the  monitor. The patch cannot be worn during these tests. You cannot remove and re-apply the  ZIO XT patch monitor.  Your ZIO patch monitor will be mailed 3 day USPS to your address on file. It may take 3-5 days  to receive your monitor after you have been enrolled.  Once you have received your monitor, please review the enclosed instructions. Your monitor  has already been registered assigning a specific monitor serial # to you.  Billing and Patient Assistance Program Information  We have supplied [employer] with any of your insurance information on file for billing purposes. [employer] offers a sliding scale Patient Assistance Program for patients that do not have  insurance, or whose insurance does not completely cover the cost of the ZIO monitor.  You must apply for the Patient Assistance Program to qualify for this discounted rate.  To apply, please call [employer] at [phone], select option 4, select option 2, ask to apply for  Patient Assistance Program. Theodore Demark will ask your household income, and how many people  are in your household. They will quote your out-of-pocket cost based on that information.  [employer] will also be able to set up  a 44-month, interest-free payment plan if needed.  Applying the monitor   Shave hair from upper left chest.  Hold abrader disc by orange tab. Rub abrader in 40 strokes over the upper left chest as  indicated in your monitor instructions.  Clean area with 4 enclosed alcohol pads. Let dry.  Apply patch as indicated in monitor instructions. Patch will be placed under collarbone on left  side of chest with arrow pointing  upward.  Rub patch adhesive wings for 2 minutes. Remove white label marked "1". Remove the white  label marked "2". Rub patch adhesive wings for 2 additional minutes.  While looking in a mirror, press and release button in center of patch. A small green light will  flash 3-4 times. This will be your only indicator that the monitor has been turned on.  Do not shower for the first 24 hours. You may shower after the first 24 hours.  Press the button if you feel a symptom. You will hear a small click. Record Date, Time and  Symptom in the Patient Logbook.  When you are ready to remove the patch, follow instructions on the last 2 pages of Patient  Logbook. Stick patch monitor onto the last page of Patient Logbook.  Place Patient Logbook in the blue and white box. Use locking tab on box and tape box closed  securely. The blue and white box has prepaid postage on it. Please place it in the mailbox as  soon as possible. Your physician should have your test results approximately 7 days after the  monitor has been mailed back to [facility].  Call [facility] at [phone] if you have questions regarding  your ZIO XT patch monitor. Call them immediately if you see an orange light blinking on your  monitor.  If your monitor falls off in less than 4 days, contact our Monitor department at [phone].  If your monitor becomes loose or falls off after 4 days call [employer] at [phone] for  suggestions on securing your monitor

## 2020-11-28 NOTE — Progress Notes (Unsigned)
Patient enrolled for [employer] to mail a 3 day ZIO XT monitor to his address on file.

## 2020-11-28 NOTE — Addendum Note (Signed)
Addended by: Loren Racer on: 11/28/2020 08:40 AM   Modules accepted: Orders

## 2020-12-03 ENCOUNTER — Other Ambulatory Visit: Payer: Self-pay

## 2020-12-03 ENCOUNTER — Inpatient Hospital Stay: Payer: Medicare Other

## 2020-12-03 ENCOUNTER — Inpatient Hospital Stay: Payer: Medicare Other | Attending: Nurse Practitioner

## 2020-12-03 VITALS — BP 149/68 | HR 55 | Temp 97.8°F | Resp 18

## 2020-12-03 DIAGNOSIS — I480 Paroxysmal atrial fibrillation: Secondary | ICD-10-CM

## 2020-12-03 DIAGNOSIS — D693 Immune thrombocytopenic purpura: Secondary | ICD-10-CM | POA: Insufficient documentation

## 2020-12-03 LAB — CBC WITH DIFFERENTIAL (CANCER CENTER ONLY)
Abs Immature Granulocytes: 0.14 10*3/uL — ABNORMAL HIGH (ref 0.00–0.07)
Basophils Absolute: 0.1 10*3/uL (ref 0.0–0.1)
Basophils Relative: 1 %
Eosinophils Absolute: 0.2 10*3/uL (ref 0.0–0.5)
Eosinophils Relative: 2 %
HCT: 37.5 % — ABNORMAL LOW (ref 39.0–52.0)
Hemoglobin: 11.9 g/dL — ABNORMAL LOW (ref 13.0–17.0)
Immature Granulocytes: 2 %
Lymphocytes Relative: 16 %
Lymphs Abs: 1.3 10*3/uL (ref 0.7–4.0)
MCH: 27.6 pg (ref 26.0–34.0)
MCHC: 31.7 g/dL (ref 30.0–36.0)
MCV: 87 fL (ref 80.0–100.0)
Monocytes Absolute: 1.8 10*3/uL — ABNORMAL HIGH (ref 0.1–1.0)
Monocytes Relative: 22 %
Neutro Abs: 4.7 10*3/uL (ref 1.7–7.7)
Neutrophils Relative %: 57 %
Platelet Count: 157 10*3/uL (ref 150–400)
RBC: 4.31 MIL/uL (ref 4.22–5.81)
RDW: 15.8 % — ABNORMAL HIGH (ref 11.5–15.5)
WBC Count: 8.2 10*3/uL (ref 4.0–10.5)
nRBC: 0 % (ref 0.0–0.2)

## 2020-12-03 MED ORDER — ROMIPLOSTIM 125 MCG ~~LOC~~ SOLR
1.0000 ug/kg | Freq: Once | SUBCUTANEOUS | Status: AC
  Administered 2020-12-03: 75 ug via SUBCUTANEOUS
  Filled 2020-12-03: qty 0.15

## 2020-12-03 NOTE — Patient Instructions (Signed)
Romiplostim injection What is this medication? ROMIPLOSTIM (roe mi PLOE stim) helps your body make more platelets. This medicine is used to treat low platelets caused by chronic idiopathic thrombocytopenic purpura (ITP) or a bone marrow syndrome caused by radiation sickness. This medicine may be used for other purposes; ask your health care provider or pharmacist if you have questions. COMMON BRAND NAME(S): Nplate What should I tell my care team before I take this medication? They need to know if you have any of these conditions: blood clots myelodysplastic syndrome an unusual or allergic reaction to romiplostim, mannitol, other medicines, foods, dyes, or preservatives pregnant or trying to get pregnant breast-feeding How should I use this medication? This medicine is injected under the skin. It is given by a health care provider in a hospital or clinic setting. A special MedGuide will be given to you before each treatment. Be sure to read this information carefully each time. Talk to your health care provider about the use of this medicine in children. While it may be prescribed for children as young as newborns for selected conditions, precautions do apply. Overdosage: If you think you have taken too much of this medicine contact a poison control center or emergency room at once. NOTE: This medicine is only for you. Do not share this medicine with others. What if I miss a dose? Keep appointments for follow-up doses. It is important not to miss your dose. Call your health care provider if you are unable to keep an appointment. What may interact with this medication? Interactions are not expected. This list may not describe all possible interactions. Give your health care provider a list of all the medicines, herbs, non-prescription drugs, or dietary supplements you use. Also tell them if you smoke, drink alcohol, or use illegal drugs. Some items may interact with your medicine. What should I  watch for while using this medication? Visit your health care provider for regular checks on your progress. You may need blood work done while you are taking this medicine. Your condition will be monitored carefully while you are receiving this medicine. It is important not to miss any appointments. What side effects may I notice from receiving this medication? Side effects that you should report to your doctor or health care professional as soon as possible: allergic reactions (skin rash, itching or hives; swelling of the face, lips, or tongue) bleeding (bloody or black, tarry stools; red or dark brown urine; spitting up blood or brown material that looks like coffee grounds; red spots on the skin; unusual bruising or bleeding from the eyes, gums, or nose) blood clot (chest pain; shortness of breath; pain, swelling, or warmth in the leg) stroke (changes in vision; confusion; trouble speaking or understanding; severe headaches; sudden numbness or weakness of the face, arm or leg; trouble walking; dizziness; loss of balance or coordination) Side effects that usually do not require medical attention (report to your doctor or health care professional if they continue or are bothersome): diarrhea dizziness headache joint pain muscle pain stomach pain trouble sleeping This list may not describe all possible side effects. Call your doctor for medical advice about side effects. You may report side effects to FDA at 1-800-FDA-1088. Where should I keep my medication? This medicine is given in a hospital or clinic. It will not be stored at home. NOTE: This sheet is a summary. It may not cover all possible information. If you have questions about this medicine, talk to your doctor, pharmacist, or health care provider.    2022 Elsevier/Gold Standard (2019-04-04 10:28:13)  

## 2020-12-04 DIAGNOSIS — H353231 Exudative age-related macular degeneration, bilateral, with active choroidal neovascularization: Secondary | ICD-10-CM | POA: Diagnosis not present

## 2020-12-10 ENCOUNTER — Inpatient Hospital Stay: Payer: Medicare Other

## 2020-12-10 ENCOUNTER — Other Ambulatory Visit: Payer: Self-pay

## 2020-12-10 VITALS — BP 145/67 | HR 55 | Temp 97.9°F | Resp 18 | Wt 163.2 lb

## 2020-12-10 DIAGNOSIS — D693 Immune thrombocytopenic purpura: Secondary | ICD-10-CM

## 2020-12-10 LAB — CBC WITH DIFFERENTIAL (CANCER CENTER ONLY)
Abs Immature Granulocytes: 0.08 10*3/uL — ABNORMAL HIGH (ref 0.00–0.07)
Basophils Absolute: 0.1 10*3/uL (ref 0.0–0.1)
Basophils Relative: 1 %
Eosinophils Absolute: 0.2 10*3/uL (ref 0.0–0.5)
Eosinophils Relative: 3 %
HCT: 37.2 % — ABNORMAL LOW (ref 39.0–52.0)
Hemoglobin: 11.8 g/dL — ABNORMAL LOW (ref 13.0–17.0)
Immature Granulocytes: 1 %
Lymphocytes Relative: 17 %
Lymphs Abs: 1.3 10*3/uL (ref 0.7–4.0)
MCH: 27.6 pg (ref 26.0–34.0)
MCHC: 31.7 g/dL (ref 30.0–36.0)
MCV: 86.9 fL (ref 80.0–100.0)
Monocytes Absolute: 1.6 10*3/uL — ABNORMAL HIGH (ref 0.1–1.0)
Monocytes Relative: 20 %
Neutro Abs: 4.6 10*3/uL (ref 1.7–7.7)
Neutrophils Relative %: 58 %
Platelet Count: 126 10*3/uL — ABNORMAL LOW (ref 150–400)
RBC: 4.28 MIL/uL (ref 4.22–5.81)
RDW: 15.8 % — ABNORMAL HIGH (ref 11.5–15.5)
WBC Count: 7.9 10*3/uL (ref 4.0–10.5)
nRBC: 0 % (ref 0.0–0.2)

## 2020-12-10 MED ORDER — ROMIPLOSTIM 125 MCG ~~LOC~~ SOLR
1.0000 ug/kg | Freq: Once | SUBCUTANEOUS | Status: AC
Start: 2020-12-10 — End: 2020-12-10
  Administered 2020-12-10: 75 ug via SUBCUTANEOUS
  Filled 2020-12-10: qty 0.15

## 2020-12-10 NOTE — Patient Instructions (Signed)
Romiplostim injection What is this medication? ROMIPLOSTIM (roe mi PLOE stim) helps your body make more platelets. This medicine is used to treat low platelets caused by chronic idiopathic thrombocytopenic purpura (ITP) or a bone marrow syndrome caused by radiation sickness. This medicine may be used for other purposes; ask your health care provider or pharmacist if you have questions. COMMON BRAND NAME(S): Nplate What should I tell my care team before I take this medication? They need to know if you have any of these conditions: blood clots myelodysplastic syndrome an unusual or allergic reaction to romiplostim, mannitol, other medicines, foods, dyes, or preservatives pregnant or trying to get pregnant breast-feeding How should I use this medication? This medicine is injected under the skin. It is given by a health care provider in a hospital or clinic setting. A special MedGuide will be given to you before each treatment. Be sure to read this information carefully each time. Talk to your health care provider about the use of this medicine in children. While it may be prescribed for children as young as newborns for selected conditions, precautions do apply. Overdosage: If you think you have taken too much of this medicine contact a poison control center or emergency room at once. NOTE: This medicine is only for you. Do not share this medicine with others. What if I miss a dose? Keep appointments for follow-up doses. It is important not to miss your dose. Call your health care provider if you are unable to keep an appointment. What may interact with this medication? Interactions are not expected. This list may not describe all possible interactions. Give your health care provider a list of all the medicines, herbs, non-prescription drugs, or dietary supplements you use. Also tell them if you smoke, drink alcohol, or use illegal drugs. Some items may interact with your medicine. What should I  watch for while using this medication? Visit your health care provider for regular checks on your progress. You may need blood work done while you are taking this medicine. Your condition will be monitored carefully while you are receiving this medicine. It is important not to miss any appointments. What side effects may I notice from receiving this medication? Side effects that you should report to your doctor or health care professional as soon as possible: allergic reactions (skin rash, itching or hives; swelling of the face, lips, or tongue) bleeding (bloody or black, tarry stools; red or dark brown urine; spitting up blood or brown material that looks like coffee grounds; red spots on the skin; unusual bruising or bleeding from the eyes, gums, or nose) blood clot (chest pain; shortness of breath; pain, swelling, or warmth in the leg) stroke (changes in vision; confusion; trouble speaking or understanding; severe headaches; sudden numbness or weakness of the face, arm or leg; trouble walking; dizziness; loss of balance or coordination) Side effects that usually do not require medical attention (report to your doctor or health care professional if they continue or are bothersome): diarrhea dizziness headache joint pain muscle pain stomach pain trouble sleeping This list may not describe all possible side effects. Call your doctor for medical advice about side effects. You may report side effects to FDA at 1-800-FDA-1088. Where should I keep my medication? This medicine is given in a hospital or clinic. It will not be stored at home. NOTE: This sheet is a summary. It may not cover all possible information. If you have questions about this medicine, talk to your doctor, pharmacist, or health care provider.    2022 Elsevier/Gold Standard (2019-04-04 10:28:13)  

## 2020-12-12 ENCOUNTER — Ambulatory Visit: Payer: Medicare Other

## 2020-12-12 ENCOUNTER — Other Ambulatory Visit: Payer: Medicare Other

## 2020-12-12 DIAGNOSIS — I480 Paroxysmal atrial fibrillation: Secondary | ICD-10-CM | POA: Diagnosis not present

## 2020-12-14 ENCOUNTER — Other Ambulatory Visit: Payer: Medicare Other

## 2020-12-17 ENCOUNTER — Other Ambulatory Visit: Payer: Self-pay

## 2020-12-17 ENCOUNTER — Inpatient Hospital Stay (HOSPITAL_BASED_OUTPATIENT_CLINIC_OR_DEPARTMENT_OTHER): Payer: Medicare Other | Admitting: Oncology

## 2020-12-17 ENCOUNTER — Inpatient Hospital Stay: Payer: Medicare Other

## 2020-12-17 VITALS — BP 121/69 | HR 61 | Temp 98.1°F | Resp 18 | Ht 64.0 in | Wt 163.6 lb

## 2020-12-17 DIAGNOSIS — D693 Immune thrombocytopenic purpura: Secondary | ICD-10-CM

## 2020-12-17 DIAGNOSIS — Z23 Encounter for immunization: Secondary | ICD-10-CM

## 2020-12-17 DIAGNOSIS — Z299 Encounter for prophylactic measures, unspecified: Secondary | ICD-10-CM

## 2020-12-17 LAB — CBC WITH DIFFERENTIAL (CANCER CENTER ONLY)
Abs Immature Granulocytes: 0.16 10*3/uL — ABNORMAL HIGH (ref 0.00–0.07)
Basophils Absolute: 0.1 10*3/uL (ref 0.0–0.1)
Basophils Relative: 1 %
Eosinophils Absolute: 0.2 10*3/uL (ref 0.0–0.5)
Eosinophils Relative: 3 %
HCT: 37 % — ABNORMAL LOW (ref 39.0–52.0)
Hemoglobin: 11.8 g/dL — ABNORMAL LOW (ref 13.0–17.0)
Immature Granulocytes: 2 %
Lymphocytes Relative: 17 %
Lymphs Abs: 1.4 10*3/uL (ref 0.7–4.0)
MCH: 27.6 pg (ref 26.0–34.0)
MCHC: 31.9 g/dL (ref 30.0–36.0)
MCV: 86.7 fL (ref 80.0–100.0)
Monocytes Absolute: 1.7 10*3/uL — ABNORMAL HIGH (ref 0.1–1.0)
Monocytes Relative: 21 %
Neutro Abs: 4.7 10*3/uL (ref 1.7–7.7)
Neutrophils Relative %: 56 %
Platelet Count: 113 10*3/uL — ABNORMAL LOW (ref 150–400)
RBC: 4.27 MIL/uL (ref 4.22–5.81)
RDW: 16.1 % — ABNORMAL HIGH (ref 11.5–15.5)
WBC Count: 8.3 10*3/uL (ref 4.0–10.5)
nRBC: 0 % (ref 0.0–0.2)

## 2020-12-17 MED ORDER — ROMIPLOSTIM 125 MCG ~~LOC~~ SOLR
1.0000 ug/kg | Freq: Once | SUBCUTANEOUS | Status: AC
  Administered 2020-12-17: 75 ug via SUBCUTANEOUS
  Filled 2020-12-17: qty 0.15

## 2020-12-17 MED ORDER — PNEUMOCOCCAL VAC POLYVALENT 25 MCG/0.5ML IJ INJ: 0.5000 mL | INJECTION | Freq: Once | INTRAMUSCULAR | Status: DC

## 2020-12-17 NOTE — Patient Instructions (Signed)
Romiplostim injection What is this medication? ROMIPLOSTIM (roe mi PLOE stim) helps your body make more platelets. This medicine is used to treat low platelets caused by chronic idiopathic thrombocytopenic purpura (ITP) or a bone marrow syndrome caused by radiation sickness. This medicine may be used for other purposes; ask your health care provider or pharmacist if you have questions. COMMON BRAND NAME(S): Nplate What should I tell my care team before I take this medication? They need to know if you have any of these conditions: blood clots myelodysplastic syndrome an unusual or allergic reaction to romiplostim, mannitol, other medicines, foods, dyes, or preservatives pregnant or trying to get pregnant breast-feeding How should I use this medication? This medicine is injected under the skin. It is given by a health care provider in a hospital or clinic setting. A special MedGuide will be given to you before each treatment. Be sure to read this information carefully each time. Talk to your health care provider about the use of this medicine in children. While it may be prescribed for children as young as newborns for selected conditions, precautions do apply. Overdosage: If you think you have taken too much of this medicine contact a poison control center or emergency room at once. NOTE: This medicine is only for you. Do not share this medicine with others. What if I miss a dose? Keep appointments for follow-up doses. It is important not to miss your dose. Call your health care provider if you are unable to keep an appointment. What may interact with this medication? Interactions are not expected. This list may not describe all possible interactions. Give your health care provider a list of all the medicines, herbs, non-prescription drugs, or dietary supplements you use. Also tell them if you smoke, drink alcohol, or use illegal drugs. Some items may interact with your medicine. What should I  watch for while using this medication? Visit your health care provider for regular checks on your progress. You may need blood work done while you are taking this medicine. Your condition will be monitored carefully while you are receiving this medicine. It is important not to miss any appointments. What side effects may I notice from receiving this medication? Side effects that you should report to your doctor or health care professional as soon as possible: allergic reactions (skin rash, itching or hives; swelling of the face, lips, or tongue) bleeding (bloody or black, tarry stools; red or dark brown urine; spitting up blood or brown material that looks like coffee grounds; red spots on the skin; unusual bruising or bleeding from the eyes, gums, or nose) blood clot (chest pain; shortness of breath; pain, swelling, or warmth in the leg) stroke (changes in vision; confusion; trouble speaking or understanding; severe headaches; sudden numbness or weakness of the face, arm or leg; trouble walking; dizziness; loss of balance or coordination) Side effects that usually do not require medical attention (report to your doctor or health care professional if they continue or are bothersome): diarrhea dizziness headache joint pain muscle pain stomach pain trouble sleeping This list may not describe all possible side effects. Call your doctor for medical advice about side effects. You may report side effects to FDA at 1-800-FDA-1088. Where should I keep my medication? This medicine is given in a hospital or clinic. It will not be stored at home. NOTE: This sheet is a summary. It may not cover all possible information. If you have questions about this medicine, talk to your doctor, pharmacist, or health care provider.    2022 Elsevier/Gold Standard (2019-04-04 10:28:13)  

## 2020-12-17 NOTE — Addendum Note (Signed)
Addended by: Lenox Ponds E on: 12/17/2020 01:13 PM   Modules accepted: Orders

## 2020-12-17 NOTE — Progress Notes (Signed)
[name] OFFICE PROGRESS NOTE   Diagnosis: ITP  INTERVAL HISTORY:   [name] returns as scheduled.  He continues weekly Nplate.  No new complaints.  He was scheduled for virtual colonoscopy.  This was canceled due to equipment failure.  The test has not been rescheduled.  He discontinued iron secondary to constipation.  Objective:  Vital signs in last 24 hours:  Blood pressure 121/69, pulse 61, temperature 98.1 F (36.7 C), temperature source Oral, resp. rate 18, height '5\' 4"'  (1.626 m), weight 163 lb 9.6 oz (74.2 kg), SpO2 98 %.    Resp: Fine end inspiratory rales at the lower posterior chest bilaterally, no respiratory distress Cardio: Regular rate and rhythm GI: No hepatomegaly Vascular: No leg edema   Lab Results:  Lab Results  Component Value Date   WBC 8.3 12/17/2020   HGB 11.8 (L) 12/17/2020   HCT 37.0 (L) 12/17/2020   MCV 86.7 12/17/2020   PLT 113 (L) 12/17/2020   NEUTROABS 4.7 12/17/2020    CMP  Lab Results  Component Value Date   NA 141 08/20/2020   K 4.2 08/20/2020   CL 108 08/20/2020   CO2 25 08/20/2020   GLUCOSE 113 (H) 08/20/2020   BUN 27 (H) 08/20/2020   CREATININE 1.27 (H) 08/20/2020   CALCIUM 9.1 08/20/2020   PROT 6.5 08/20/2020   ALBUMIN 3.9 08/20/2020   AST 18 08/20/2020   ALT 14 08/20/2020   ALKPHOS 106 08/20/2020   BILITOT 0.4 08/20/2020   GFRNONAA 52 (L) 08/20/2020   GFRAA 59 (L) 10/31/2019     Medications: I have reviewed the patient's current medications.   Assessment/Plan: Thrombocytopenia Bone marrow biopsy 07/08/2016-no evidence of B-cell lymphoma, 40% cellular bone marrow with trilineage hematopoiesis, megakaryocytes present with normal morphology, normal cytogenetics, negative for BRAF mutation Flow cytometry 01/05/2009-no monoclonal B-cell or phenotypically abnormal T-cell population Prednisone starting 06/29/2019 Nplate 07/04/2019, 06/12/8784, Nplate 07/18/2019 Prednisone 20 mg daily beginning  07/25/2019 Platelets 11,000 on 08/15/2019, prednisone increased to 40 mg daily, Nplate resume 7/67/2094  Prednisone decreased to 30 mg daily 08/25/2019, Nplate continued Prednisone continued at 30 mg daily 09/13/2019, weekly Nplate continued Prednisone taper to 20 mg daily 09/23/2019, weekly Nplate continued Prednisone taper to 10 mg daily 09/30/2019, weekly Nplate continued Prednisone taper to 5 mg daily 10/21/2019, weekly Nplate continued Prednisone taper to 5 mg every other day for 5 doses then stop 11/11/2019, weekly Nplate continued Nplate last given 09/08/6281 Promacta cost prohibitive Nplate beginning 08/06/2945, held 08/27/2020 and 09/03/2020 due to patient vacation Nplate resumed 6/54/6503 Nplate held 07/05/6566 secondary to an elevated platelet count Nplate resumed with dose reduction to 1 mcg/kg 10/09/2020 Nplate dose decreased to 0.5 mcg/kg 10/29/2020 Nplate dose increased to 1 mcg/kg 11/07/2020 Hairy cell leukemia 1982, status post splenectomy Coronary artery disease Aortic stenosis 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 appears stable.  The platelet count remains adequate.  He will continue weekly Nplate.  He will return for an office visit in 1 month.  We will contact radiology to reschedule the virtual colonoscopy.  [name] will obtain an influenza vaccine and  COVID-19 booster vaccine.  We will administer a 23 valent pneumococcal vaccine when he is here in 1 month.  Betsy Coder, MD  12/17/2020  11:08 AM

## 2020-12-17 NOTE — Addendum Note (Signed)
Addended by: Roselind Messier A on: 12/17/2020 01:24 PM   Modules accepted: Orders

## 2020-12-19 ENCOUNTER — Other Ambulatory Visit: Payer: Self-pay | Admitting: *Deleted

## 2020-12-19 DIAGNOSIS — D693 Immune thrombocytopenic purpura: Secondary | ICD-10-CM

## 2020-12-19 NOTE — Progress Notes (Signed)
Per Dr. Benay Spice: weekly cbc w/nplate

## 2020-12-20 ENCOUNTER — Encounter: Payer: Self-pay | Admitting: Oncology

## 2020-12-20 ENCOUNTER — Ambulatory Visit: Payer: Medicare Other | Attending: Internal Medicine

## 2020-12-20 ENCOUNTER — Other Ambulatory Visit (HOSPITAL_BASED_OUTPATIENT_CLINIC_OR_DEPARTMENT_OTHER): Payer: Self-pay

## 2020-12-20 DIAGNOSIS — Z23 Encounter for immunization: Secondary | ICD-10-CM

## 2020-12-20 MED ORDER — PFIZER COVID-19 VAC BIVALENT 30 MCG/0.3ML IM SUSP
INTRAMUSCULAR | 0 refills | Status: DC
  Filled 2020-12-20: qty 0.3, 1d supply, fill #0

## 2020-12-20 NOTE — Progress Notes (Signed)
   Covid-19 Vaccination Clinic  Name:  [name]    MRN: [id number] DOB: June 19, 1923  12/20/2020  [name] was observed post Covid-19 immunization for 15 minutes without incident. He was provided with Vaccine Information Sheet and instruction to access the V-Safe system.   [name] was instructed to call 911 with any severe reactions post vaccine: Difficulty breathing  Swelling of face and throat  A fast heartbeat  A bad rash all over body  Dizziness and weakness   Immunizations Administered     Name Date Dose VIS Date Route   Pfizer Covid-19 Vaccine Bivalent Booster 12/20/2020 10:52 AM 0.3 mL 10/31/2020 Intramuscular   Manufacturer: [place]   Lot: [id number]   [place]: [phone]

## 2020-12-21 ENCOUNTER — Ambulatory Visit: Payer: Medicare Other

## 2020-12-21 ENCOUNTER — Other Ambulatory Visit: Payer: Medicare Other

## 2020-12-21 ENCOUNTER — Ambulatory Visit: Payer: Medicare Other | Admitting: Oncology

## 2020-12-24 ENCOUNTER — Inpatient Hospital Stay: Payer: Medicare Other

## 2020-12-24 ENCOUNTER — Ambulatory Visit: Payer: Medicare Other

## 2020-12-24 ENCOUNTER — Other Ambulatory Visit: Payer: Self-pay

## 2020-12-24 VITALS — BP 141/64 | HR 55 | Temp 98.3°F | Resp 18

## 2020-12-24 DIAGNOSIS — D693 Immune thrombocytopenic purpura: Secondary | ICD-10-CM | POA: Diagnosis not present

## 2020-12-24 LAB — CBC WITH DIFFERENTIAL (CANCER CENTER ONLY)
Abs Immature Granulocytes: 0.12 10*3/uL — ABNORMAL HIGH (ref 0.00–0.07)
Basophils Absolute: 0.1 10*3/uL (ref 0.0–0.1)
Basophils Relative: 1 %
Eosinophils Absolute: 0.2 10*3/uL (ref 0.0–0.5)
Eosinophils Relative: 2 %
HCT: 37.6 % — ABNORMAL LOW (ref 39.0–52.0)
Hemoglobin: 11.8 g/dL — ABNORMAL LOW (ref 13.0–17.0)
Immature Granulocytes: 1 %
Lymphocytes Relative: 16 %
Lymphs Abs: 1.4 10*3/uL (ref 0.7–4.0)
MCH: 27.5 pg (ref 26.0–34.0)
MCHC: 31.4 g/dL (ref 30.0–36.0)
MCV: 87.6 fL (ref 80.0–100.0)
Monocytes Absolute: 1.7 10*3/uL — ABNORMAL HIGH (ref 0.1–1.0)
Monocytes Relative: 20 %
Neutro Abs: 5.2 10*3/uL (ref 1.7–7.7)
Neutrophils Relative %: 60 %
Platelet Count: 169 10*3/uL (ref 150–400)
RBC: 4.29 MIL/uL (ref 4.22–5.81)
RDW: 16.2 % — ABNORMAL HIGH (ref 11.5–15.5)
WBC Count: 8.7 10*3/uL (ref 4.0–10.5)
nRBC: 0 % (ref 0.0–0.2)

## 2020-12-24 MED ORDER — ROMIPLOSTIM 125 MCG ~~LOC~~ SOLR
1.0000 ug/kg | Freq: Once | SUBCUTANEOUS | Status: AC
  Administered 2020-12-24: 75 ug via SUBCUTANEOUS
  Filled 2020-12-24: qty 0.15

## 2020-12-24 NOTE — Patient Instructions (Signed)
Romiplostim injection What is this medication? ROMIPLOSTIM (roe mi PLOE stim) helps your body make more platelets. This medicine is used to treat low platelets caused by chronic idiopathic thrombocytopenic purpura (ITP) or a bone marrow syndrome caused by radiation sickness. This medicine may be used for other purposes; ask your health care provider or pharmacist if you have questions. COMMON BRAND NAME(S): Nplate What should I tell my care team before I take this medication? They need to know if you have any of these conditions: blood clots myelodysplastic syndrome an unusual or allergic reaction to romiplostim, mannitol, other medicines, foods, dyes, or preservatives pregnant or trying to get pregnant breast-feeding How should I use this medication? This medicine is injected under the skin. It is given by a health care provider in a hospital or clinic setting. A special MedGuide will be given to you before each treatment. Be sure to read this information carefully each time. Talk to your health care provider about the use of this medicine in children. While it may be prescribed for children as young as newborns for selected conditions, precautions do apply. Overdosage: If you think you have taken too much of this medicine contact a poison control center or emergency room at once. NOTE: This medicine is only for you. Do not share this medicine with others. What if I miss a dose? Keep appointments for follow-up doses. It is important not to miss your dose. Call your health care provider if you are unable to keep an appointment. What may interact with this medication? Interactions are not expected. This list may not describe all possible interactions. Give your health care provider a list of all the medicines, herbs, non-prescription drugs, or dietary supplements you use. Also tell them if you smoke, drink alcohol, or use illegal drugs. Some items may interact with your medicine. What should I  watch for while using this medication? Visit your health care provider for regular checks on your progress. You may need blood work done while you are taking this medicine. Your condition will be monitored carefully while you are receiving this medicine. It is important not to miss any appointments. What side effects may I notice from receiving this medication? Side effects that you should report to your doctor or health care professional as soon as possible: allergic reactions (skin rash, itching or hives; swelling of the face, lips, or tongue) bleeding (bloody or black, tarry stools; red or dark brown urine; spitting up blood or brown material that looks like coffee grounds; red spots on the skin; unusual bruising or bleeding from the eyes, gums, or nose) blood clot (chest pain; shortness of breath; pain, swelling, or warmth in the leg) stroke (changes in vision; confusion; trouble speaking or understanding; severe headaches; sudden numbness or weakness of the face, arm or leg; trouble walking; dizziness; loss of balance or coordination) Side effects that usually do not require medical attention (report to your doctor or health care professional if they continue or are bothersome): diarrhea dizziness headache joint pain muscle pain stomach pain trouble sleeping This list may not describe all possible side effects. Call your doctor for medical advice about side effects. You may report side effects to FDA at 1-800-FDA-1088. Where should I keep my medication? This medicine is given in a hospital or clinic. It will not be stored at home. NOTE: This sheet is a summary. It may not cover all possible information. If you have questions about this medicine, talk to your doctor, pharmacist, or health care provider.    2022 Elsevier/Gold Standard (2019-04-04 10:28:13)  

## 2020-12-29 DIAGNOSIS — Z23 Encounter for immunization: Secondary | ICD-10-CM | POA: Diagnosis not present

## 2020-12-31 ENCOUNTER — Inpatient Hospital Stay: Payer: Medicare Other

## 2020-12-31 ENCOUNTER — Other Ambulatory Visit: Payer: Self-pay

## 2020-12-31 VITALS — BP 144/68 | HR 58 | Temp 97.7°F | Resp 18 | Wt 165.8 lb

## 2020-12-31 DIAGNOSIS — D693 Immune thrombocytopenic purpura: Secondary | ICD-10-CM

## 2020-12-31 LAB — CBC WITH DIFFERENTIAL (CANCER CENTER ONLY)
Abs Immature Granulocytes: 0.15 10*3/uL — ABNORMAL HIGH (ref 0.00–0.07)
Basophils Absolute: 0.1 10*3/uL (ref 0.0–0.1)
Basophils Relative: 2 %
Eosinophils Absolute: 0.2 10*3/uL (ref 0.0–0.5)
Eosinophils Relative: 3 %
HCT: 36.5 % — ABNORMAL LOW (ref 39.0–52.0)
Hemoglobin: 11.5 g/dL — ABNORMAL LOW (ref 13.0–17.0)
Immature Granulocytes: 2 %
Lymphocytes Relative: 17 %
Lymphs Abs: 1.3 10*3/uL (ref 0.7–4.0)
MCH: 27.4 pg (ref 26.0–34.0)
MCHC: 31.5 g/dL (ref 30.0–36.0)
MCV: 87.1 fL (ref 80.0–100.0)
Monocytes Absolute: 1.8 10*3/uL — ABNORMAL HIGH (ref 0.1–1.0)
Monocytes Relative: 22 %
Neutro Abs: 4.6 10*3/uL (ref 1.7–7.7)
Neutrophils Relative %: 54 %
Platelet Count: 163 10*3/uL (ref 150–400)
RBC: 4.19 MIL/uL — ABNORMAL LOW (ref 4.22–5.81)
RDW: 16.1 % — ABNORMAL HIGH (ref 11.5–15.5)
WBC Count: 8.1 10*3/uL (ref 4.0–10.5)
nRBC: 0 % (ref 0.0–0.2)

## 2020-12-31 MED ORDER — ROMIPLOSTIM 125 MCG ~~LOC~~ SOLR
1.0000 ug/kg | Freq: Once | SUBCUTANEOUS | Status: AC
  Administered 2020-12-31: 75 ug via SUBCUTANEOUS
  Filled 2020-12-31: qty 0.15

## 2020-12-31 NOTE — Patient Instructions (Signed)
[name]  Discharge Instructions: Thank you for choosing [facility] to provide your oncology and hematology care.   If you have a lab appointment with the [facility], please go directly to the [facility] and check in at the registration area.   Wear comfortable clothing and clothing appropriate for easy access to any Portacath or PICC line.   We strive to give you quality time with your provider. You may need to reschedule your appointment if you arrive late (15 or more minutes).  Arriving late affects you and other patients whose appointments are after yours.  Also, if you miss three or more appointments without notifying the office, you may be dismissed from the clinic at the provider's discretion.      For prescription refill requests, have your pharmacy contact our office and allow 72 hours for refills to be completed.    Today you received the following N plate      To help prevent nausea and vomiting after your treatment, we encourage you to take your nausea medication as directed.  BELOW ARE SYMPTOMS THAT SHOULD BE REPORTED IMMEDIATELY: *FEVER GREATER THAN 100.4 F (38 C) OR HIGHER *CHILLS OR SWEATING *NAUSEA AND VOMITING THAT IS NOT CONTROLLED WITH YOUR NAUSEA MEDICATION *UNUSUAL SHORTNESS OF BREATH *UNUSUAL BRUISING OR BLEEDING *URINARY PROBLEMS (pain or burning when urinating, or frequent urination) *BOWEL PROBLEMS (unusual diarrhea, constipation, pain near the anus) TENDERNESS IN MOUTH AND THROAT WITH OR WITHOUT PRESENCE OF ULCERS (sore throat, sores in mouth, or a toothache) UNUSUAL RASH, SWELLING OR PAIN  UNUSUAL VAGINAL DISCHARGE OR ITCHING   Items with * indicate a potential emergency and should be followed up as soon as possible or go to the Emergency Department if any problems should occur.  Please show the CHEMOTHERAPY ALERT CARD or IMMUNOTHERAPY ALERT CARD at check-in to the Emergency Department and triage  nurse.  Should you have questions after your visit or need to cancel or reschedule your appointment, please contact [facility]  Dept: [phone]  and follow the prompts.  Office hours are 8:00 a.m. to 4:30 p.m. Monday - Friday. Please note that voicemails left after 4:00 p.m. may not be returned until the following business day.  We are closed weekends and major holidays. You have access to a nurse at all times for urgent questions. Please call the main number to the clinic Dept: [phone] and follow the prompts.   For any non-urgent questions, you may also contact your provider using MyChart. We now offer e-Visits for anyone 66 and older to request care online for non-urgent symptoms. For details visit [email or web address].   Also download the MyChart app! Go to the app store, search "MyChart", open the app, select [facility], and log in with your MyChart username and password.  Due to Covid, a mask is required upon entering the hospital/clinic. If you do not have a mask, one will be given to you upon arrival. For doctor visits, patients may have 1 support person aged 50 or older with them. For treatment visits, patients cannot have anyone with them due to current Covid guidelines and our immunocompromised population.   Romiplostim injection What is this medication? ROMIPLOSTIM (roe mi PLOE stim) helps your body make more platelets. This medicine is used to treat low platelets caused by chronic idiopathic thrombocytopenic purpura (ITP) or a bone marrow syndrome caused by radiation sickness. This medicine may be used for other purposes; ask your health  care provider or pharmacist if you have questions. COMMON BRAND NAME(S): Nplate What should I tell my care team before I take this medication? They need to know if you have any of these conditions: blood clots myelodysplastic syndrome an unusual or allergic reaction to romiplostim, mannitol, other medicines,  foods, dyes, or preservatives pregnant or trying to get pregnant breast-feeding How should I use this medication? This medicine is injected under the skin. It is given by a health care provider in a hospital or clinic setting. A special MedGuide will be given to you before each treatment. Be sure to read this information carefully each time. Talk to your health care provider about the use of this medicine in children. While it may be prescribed for children as young as newborns for selected conditions, precautions do apply. Overdosage: If you think you have taken too much of this medicine contact a poison control center or emergency room at once. NOTE: This medicine is only for you. Do not share this medicine with others. What if I miss a dose? Keep appointments for follow-up doses. It is important not to miss your dose. Call your health care provider if you are unable to keep an appointment. What may interact with this medication? Interactions are not expected. This list may not describe all possible interactions. Give your health care provider a list of all the medicines, herbs, non-prescription drugs, or dietary supplements you use. Also tell them if you smoke, drink alcohol, or use illegal drugs. Some items may interact with your medicine. What should I watch for while using this medication? Visit your health care provider for regular checks on your progress. You may need blood work done while you are taking this medicine. Your condition will be monitored carefully while you are receiving this medicine. It is important not to miss any appointments. What side effects may I notice from receiving this medication? Side effects that you should report to your doctor or health care professional as soon as possible: allergic reactions (skin rash, itching or hives; swelling of the face, lips, or tongue) bleeding (bloody or black, tarry stools; red or dark brown urine; spitting up blood or brown  material that looks like coffee grounds; red spots on the skin; unusual bruising or bleeding from the eyes, gums, or nose) blood clot (chest pain; shortness of breath; pain, swelling, or warmth in the leg) stroke (changes in vision; confusion; trouble speaking or understanding; severe headaches; sudden numbness or weakness of the face, arm or leg; trouble walking; dizziness; loss of balance or coordination) Side effects that usually do not require medical attention (report to your doctor or health care professional if they continue or are bothersome): diarrhea dizziness headache joint pain muscle pain stomach pain trouble sleeping This list may not describe all possible side effects. Call your doctor for medical advice about side effects. You may report side effects to FDA at 1-800-FDA-1088. Where should I keep my medication? This medicine is given in a hospital or clinic. It will not be stored at home. NOTE: This sheet is a summary. It may not cover all possible information. If you have questions about this medicine, talk to your doctor, pharmacist, or health care provider.  2022 Elsevier/Gold Standard (2019-04-04 10:28:13)

## 2021-01-07 ENCOUNTER — Inpatient Hospital Stay: Payer: Medicare Other

## 2021-01-07 ENCOUNTER — Other Ambulatory Visit: Payer: Self-pay

## 2021-01-07 ENCOUNTER — Inpatient Hospital Stay: Payer: Medicare Other | Attending: Nurse Practitioner

## 2021-01-07 VITALS — BP 148/75 | HR 55 | Temp 97.5°F | Resp 18 | Ht 64.0 in | Wt 164.6 lb

## 2021-01-07 DIAGNOSIS — D693 Immune thrombocytopenic purpura: Secondary | ICD-10-CM

## 2021-01-07 LAB — CBC WITH DIFFERENTIAL (CANCER CENTER ONLY)
Abs Immature Granulocytes: 0.18 10*3/uL — ABNORMAL HIGH (ref 0.00–0.07)
Basophils Absolute: 0.1 10*3/uL (ref 0.0–0.1)
Basophils Relative: 1 %
Eosinophils Absolute: 0.2 10*3/uL (ref 0.0–0.5)
Eosinophils Relative: 2 %
HCT: 36.2 % — ABNORMAL LOW (ref 39.0–52.0)
Hemoglobin: 11.6 g/dL — ABNORMAL LOW (ref 13.0–17.0)
Immature Granulocytes: 2 %
Lymphocytes Relative: 18 %
Lymphs Abs: 1.4 10*3/uL (ref 0.7–4.0)
MCH: 27.6 pg (ref 26.0–34.0)
MCHC: 32 g/dL (ref 30.0–36.0)
MCV: 86 fL (ref 80.0–100.0)
Monocytes Absolute: 1.3 10*3/uL — ABNORMAL HIGH (ref 0.1–1.0)
Monocytes Relative: 17 %
Neutro Abs: 4.6 10*3/uL (ref 1.7–7.7)
Neutrophils Relative %: 60 %
Platelet Count: 109 10*3/uL — ABNORMAL LOW (ref 150–400)
RBC: 4.21 MIL/uL — ABNORMAL LOW (ref 4.22–5.81)
RDW: 15.9 % — ABNORMAL HIGH (ref 11.5–15.5)
WBC Count: 7.7 10*3/uL (ref 4.0–10.5)
nRBC: 0 % (ref 0.0–0.2)

## 2021-01-07 MED ORDER — ROMIPLOSTIM 125 MCG ~~LOC~~ SOLR
1.0000 ug/kg | Freq: Once | SUBCUTANEOUS | Status: AC
  Administered 2021-01-07: 75 ug via SUBCUTANEOUS
  Filled 2021-01-07: qty 0.15

## 2021-01-07 NOTE — Patient Instructions (Signed)
[facility]  Discharge Instructions: Thank you for choosing [facility] to provide your oncology and hematology care.   If you have a lab appointment with the [facility], please go directly to the [facility] and check in at the registration area.   Wear comfortable clothing and clothing appropriate for easy access to any Portacath or PICC line.   We strive to give you quality time with your provider. You may need to reschedule your appointment if you arrive late (15 or more minutes).  Arriving late affects you and other patients whose appointments are after yours.  Also, if you miss three or more appointments without notifying the office, you may be dismissed from the clinic at the provider's discretion.      For prescription refill requests, have your pharmacy contact our office and allow 72 hours for refills to be completed.    Today you received the following: N plate   To help prevent nausea and vomiting after your treatment, we encourage you to take your nausea medication as directed.  BELOW ARE SYMPTOMS THAT SHOULD BE REPORTED IMMEDIATELY: *FEVER GREATER THAN 100.4 F (38 C) OR HIGHER *CHILLS OR SWEATING *NAUSEA AND VOMITING THAT IS NOT CONTROLLED WITH YOUR NAUSEA MEDICATION *UNUSUAL SHORTNESS OF BREATH *UNUSUAL BRUISING OR BLEEDING *URINARY PROBLEMS (pain or burning when urinating, or frequent urination) *BOWEL PROBLEMS (unusual diarrhea, constipation, pain near the anus) TENDERNESS IN MOUTH AND THROAT WITH OR WITHOUT PRESENCE OF ULCERS (sore throat, sores in mouth, or a toothache) UNUSUAL RASH, SWELLING OR PAIN  UNUSUAL VAGINAL DISCHARGE OR ITCHING   Items with * indicate a potential emergency and should be followed up as soon as possible or go to the Emergency Department if any problems should occur.  Please show the CHEMOTHERAPY ALERT CARD or IMMUNOTHERAPY ALERT CARD at check-in to the Emergency Department and triage  nurse.  Should you have questions after your visit or need to cancel or reschedule your appointment, please contact [facility]  Dept: [phone]  and follow the prompts.  Office hours are 8:00 a.m. to 4:30 p.m. Monday - Friday. Please note that voicemails left after 4:00 p.m. may not be returned until the following business day.  We are closed weekends and major holidays. You have access to a nurse at all times for urgent questions. Please call the main number to the clinic Dept: [phone] and follow the prompts.   For any non-urgent questions, you may also contact your provider using MyChart. We now offer e-Visits for anyone 13 and older to request care online for non-urgent symptoms. For details visit [email or web address].   Also download the MyChart app! Go to the app store, search "MyChart", open the app, select [facility], and log in with your MyChart username and password.  Due to Covid, a mask is required upon entering the hospital/clinic. If you do not have a mask, one will be given to you upon arrival. For doctor visits, patients may have 1 support person aged 52 or older with them. For treatment visits, patients cannot have anyone with them due to current Covid guidelines and our immunocompromised population.   Romiplostim injection What is this medication? ROMIPLOSTIM (roe mi PLOE stim) helps your body make more platelets. This medicine is used to treat low platelets caused by chronic idiopathic thrombocytopenic purpura (ITP) or a bone marrow syndrome caused by radiation sickness. This medicine may be used for other purposes; ask your health care provider or  pharmacist if you have questions. COMMON BRAND NAME(S): Nplate What should I tell my care team before I take this medication? They need to know if you have any of these conditions: blood clots myelodysplastic syndrome an unusual or allergic reaction to romiplostim, mannitol, other medicines,  foods, dyes, or preservatives pregnant or trying to get pregnant breast-feeding How should I use this medication? This medicine is injected under the skin. It is given by a health care provider in a hospital or clinic setting. A special MedGuide will be given to you before each treatment. Be sure to read this information carefully each time. Talk to your health care provider about the use of this medicine in children. While it may be prescribed for children as young as newborns for selected conditions, precautions do apply. Overdosage: If you think you have taken too much of this medicine contact a poison control center or emergency room at once. NOTE: This medicine is only for you. Do not share this medicine with others. What if I miss a dose? Keep appointments for follow-up doses. It is important not to miss your dose. Call your health care provider if you are unable to keep an appointment. What may interact with this medication? Interactions are not expected. This list may not describe all possible interactions. Give your health care provider a list of all the medicines, herbs, non-prescription drugs, or dietary supplements you use. Also tell them if you smoke, drink alcohol, or use illegal drugs. Some items may interact with your medicine. What should I watch for while using this medication? Visit your health care provider for regular checks on your progress. You may need blood work done while you are taking this medicine. Your condition will be monitored carefully while you are receiving this medicine. It is important not to miss any appointments. What side effects may I notice from receiving this medication? Side effects that you should report to your doctor or health care professional as soon as possible: allergic reactions (skin rash, itching or hives; swelling of the face, lips, or tongue) bleeding (bloody or black, tarry stools; red or dark brown urine; spitting up blood or brown  material that looks like coffee grounds; red spots on the skin; unusual bruising or bleeding from the eyes, gums, or nose) blood clot (chest pain; shortness of breath; pain, swelling, or warmth in the leg) stroke (changes in vision; confusion; trouble speaking or understanding; severe headaches; sudden numbness or weakness of the face, arm or leg; trouble walking; dizziness; loss of balance or coordination) Side effects that usually do not require medical attention (report to your doctor or health care professional if they continue or are bothersome): diarrhea dizziness headache joint pain muscle pain stomach pain trouble sleeping This list may not describe all possible side effects. Call your doctor for medical advice about side effects. You may report side effects to FDA at 1-800-FDA-1088. Where should I keep my medication? This medicine is given in a hospital or clinic. It will not be stored at home. NOTE: This sheet is a summary. It may not cover all possible information. If you have questions about this medicine, talk to your doctor, pharmacist, or health care provider.  2022 Elsevier/Gold Standard (2020-11-06 00:00:00)

## 2021-01-08 DIAGNOSIS — H43821 Vitreomacular adhesion, right eye: Secondary | ICD-10-CM | POA: Diagnosis not present

## 2021-01-08 DIAGNOSIS — H43812 Vitreous degeneration, left eye: Secondary | ICD-10-CM | POA: Diagnosis not present

## 2021-01-08 DIAGNOSIS — H353110 Nonexudative age-related macular degeneration, right eye, stage unspecified: Secondary | ICD-10-CM | POA: Diagnosis not present

## 2021-01-08 DIAGNOSIS — H353221 Exudative age-related macular degeneration, left eye, with active choroidal neovascularization: Secondary | ICD-10-CM | POA: Diagnosis not present

## 2021-01-08 DIAGNOSIS — H35412 Lattice degeneration of retina, left eye: Secondary | ICD-10-CM | POA: Diagnosis not present

## 2021-01-08 DIAGNOSIS — Z961 Presence of intraocular lens: Secondary | ICD-10-CM | POA: Diagnosis not present

## 2021-01-08 DIAGNOSIS — H35373 Puckering of macula, bilateral: Secondary | ICD-10-CM | POA: Diagnosis not present

## 2021-01-14 ENCOUNTER — Encounter: Payer: Self-pay | Admitting: Nurse Practitioner

## 2021-01-14 ENCOUNTER — Inpatient Hospital Stay: Payer: Medicare Other

## 2021-01-14 ENCOUNTER — Other Ambulatory Visit: Payer: Self-pay

## 2021-01-14 ENCOUNTER — Inpatient Hospital Stay (HOSPITAL_BASED_OUTPATIENT_CLINIC_OR_DEPARTMENT_OTHER): Payer: Medicare Other | Admitting: Nurse Practitioner

## 2021-01-14 VITALS — BP 127/72 | HR 64 | Temp 97.8°F | Resp 18 | Ht 64.0 in | Wt 164.0 lb

## 2021-01-14 DIAGNOSIS — D693 Immune thrombocytopenic purpura: Secondary | ICD-10-CM

## 2021-01-14 LAB — CBC WITH DIFFERENTIAL (CANCER CENTER ONLY)
Abs Immature Granulocytes: 0.12 10*3/uL — ABNORMAL HIGH (ref 0.00–0.07)
Basophils Absolute: 0.1 10*3/uL (ref 0.0–0.1)
Basophils Relative: 1 %
Eosinophils Absolute: 0.2 10*3/uL (ref 0.0–0.5)
Eosinophils Relative: 2 %
HCT: 38 % — ABNORMAL LOW (ref 39.0–52.0)
Hemoglobin: 12 g/dL — ABNORMAL LOW (ref 13.0–17.0)
Immature Granulocytes: 1 %
Lymphocytes Relative: 16 %
Lymphs Abs: 1.3 10*3/uL (ref 0.7–4.0)
MCH: 27.6 pg (ref 26.0–34.0)
MCHC: 31.6 g/dL (ref 30.0–36.0)
MCV: 87.6 fL (ref 80.0–100.0)
Monocytes Absolute: 1.7 10*3/uL — ABNORMAL HIGH (ref 0.1–1.0)
Monocytes Relative: 20 %
Neutro Abs: 5.1 10*3/uL (ref 1.7–7.7)
Neutrophils Relative %: 60 %
Platelet Count: 63 10*3/uL — ABNORMAL LOW (ref 150–400)
RBC: 4.34 MIL/uL (ref 4.22–5.81)
RDW: 15.6 % — ABNORMAL HIGH (ref 11.5–15.5)
WBC Count: 8.5 10*3/uL (ref 4.0–10.5)
nRBC: 0 % (ref 0.0–0.2)

## 2021-01-14 MED ORDER — ROMIPLOSTIM 125 MCG ~~LOC~~ SOLR
1.0000 ug/kg | Freq: Once | SUBCUTANEOUS | Status: AC
  Administered 2021-01-14: 75 ug via SUBCUTANEOUS
  Filled 2021-01-14: qty 0.15

## 2021-01-14 NOTE — Patient Instructions (Signed)
[facility]  Discharge Instructions: Thank you for choosing [facility] to provide your oncology and hematology care.   If you have a lab appointment with the [facility], please go directly to the [facility] and check in at the registration area.   Wear comfortable clothing and clothing appropriate for easy access to any Portacath or PICC line.   We strive to give you quality time with your provider. You may need to reschedule your appointment if you arrive late (15 or more minutes).  Arriving late affects you and other patients whose appointments are after yours.  Also, if you miss three or more appointments without notifying the office, you may be dismissed from the clinic at the provider's discretion.      For prescription refill requests, have your pharmacy contact our office and allow 72 hours for refills to be completed.    Today you received the following N-Plate    To help prevent nausea and vomiting after your treatment, we encourage you to take your nausea medication as directed.  BELOW ARE SYMPTOMS THAT SHOULD BE REPORTED IMMEDIATELY: *FEVER GREATER THAN 100.4 F (38 C) OR HIGHER *CHILLS OR SWEATING *NAUSEA AND VOMITING THAT IS NOT CONTROLLED WITH YOUR NAUSEA MEDICATION *UNUSUAL SHORTNESS OF BREATH *UNUSUAL BRUISING OR BLEEDING *URINARY PROBLEMS (pain or burning when urinating, or frequent urination) *BOWEL PROBLEMS (unusual diarrhea, constipation, pain near the anus) TENDERNESS IN MOUTH AND THROAT WITH OR WITHOUT PRESENCE OF ULCERS (sore throat, sores in mouth, or a toothache) UNUSUAL RASH, SWELLING OR PAIN  UNUSUAL VAGINAL DISCHARGE OR ITCHING   Items with * indicate a potential emergency and should be followed up as soon as possible or go to the Emergency Department if any problems should occur.  Please show the CHEMOTHERAPY ALERT CARD or IMMUNOTHERAPY ALERT CARD at check-in to the Emergency Department and triage  nurse.  Should you have questions after your visit or need to cancel or reschedule your appointment, please contact [facility]  Dept: [phone]  and follow the prompts.  Office hours are 8:00 a.m. to 4:30 p.m. Monday - Friday. Please note that voicemails left after 4:00 p.m. may not be returned until the following business day.  We are closed weekends and major holidays. You have access to a nurse at all times for urgent questions. Please call the main number to the clinic Dept: [phone] and follow the prompts.   For any non-urgent questions, you may also contact your provider using MyChart. We now offer e-Visits for anyone 7 and older to request care online for non-urgent symptoms. For details visit [email or web address].   Also download the MyChart app! Go to the app store, search "MyChart", open the app, select [facility], and log in with your MyChart username and password.  Due to Covid, a mask is required upon entering the hospital/clinic. If you do not have a mask, one will be given to you upon arrival. For doctor visits, patients may have 1 support person aged 2 or older with them. For treatment visits, patients cannot have anyone with them due to current Covid guidelines and our immunocompromised population.   Romiplostim injection What is this medication? ROMIPLOSTIM (roe mi PLOE stim) helps your body make more platelets. This medicine is used to treat low platelets caused by chronic idiopathic thrombocytopenic purpura (ITP) or a bone marrow syndrome caused by radiation sickness. This medicine may be used for other purposes; ask your health care provider or  pharmacist if you have questions. COMMON BRAND NAME(S): Nplate What should I tell my care team before I take this medication? They need to know if you have any of these conditions: blood clots myelodysplastic syndrome an unusual or allergic reaction to romiplostim, mannitol, other medicines,  foods, dyes, or preservatives pregnant or trying to get pregnant breast-feeding How should I use this medication? This medicine is injected under the skin. It is given by a health care provider in a hospital or clinic setting. A special MedGuide will be given to you before each treatment. Be sure to read this information carefully each time. Talk to your health care provider about the use of this medicine in children. While it may be prescribed for children as young as newborns for selected conditions, precautions do apply. Overdosage: If you think you have taken too much of this medicine contact a poison control center or emergency room at once. NOTE: This medicine is only for you. Do not share this medicine with others. What if I miss a dose? Keep appointments for follow-up doses. It is important not to miss your dose. Call your health care provider if you are unable to keep an appointment. What may interact with this medication? Interactions are not expected. This list may not describe all possible interactions. Give your health care provider a list of all the medicines, herbs, non-prescription drugs, or dietary supplements you use. Also tell them if you smoke, drink alcohol, or use illegal drugs. Some items may interact with your medicine. What should I watch for while using this medication? Visit your health care provider for regular checks on your progress. You may need blood work done while you are taking this medicine. Your condition will be monitored carefully while you are receiving this medicine. It is important not to miss any appointments. What side effects may I notice from receiving this medication? Side effects that you should report to your doctor or health care professional as soon as possible: allergic reactions (skin rash, itching or hives; swelling of the face, lips, or tongue) bleeding (bloody or black, tarry stools; red or dark brown urine; spitting up blood or brown  material that looks like coffee grounds; red spots on the skin; unusual bruising or bleeding from the eyes, gums, or nose) blood clot (chest pain; shortness of breath; pain, swelling, or warmth in the leg) stroke (changes in vision; confusion; trouble speaking or understanding; severe headaches; sudden numbness or weakness of the face, arm or leg; trouble walking; dizziness; loss of balance or coordination) Side effects that usually do not require medical attention (report to your doctor or health care professional if they continue or are bothersome): diarrhea dizziness headache joint pain muscle pain stomach pain trouble sleeping This list may not describe all possible side effects. Call your doctor for medical advice about side effects. You may report side effects to FDA at 1-800-FDA-1088. Where should I keep my medication? This medicine is given in a hospital or clinic. It will not be stored at home. NOTE: This sheet is a summary. It may not cover all possible information. If you have questions about this medicine, talk to your doctor, pharmacist, or health care provider.  2022 Elsevier/Gold Standard (2020-11-06 00:00:00)

## 2021-01-14 NOTE — Progress Notes (Signed)
  [facility] OFFICE PROGRESS NOTE   Diagnosis: ITP  INTERVAL HISTORY:   [name] returns as scheduled.  He continues weekly Nplate.  He denies bleeding.  Objective:  Vital signs in last 24 hours:  Blood pressure 127/72, pulse 64, temperature 97.8 F (36.6 C), temperature source Oral, resp. rate 18, height $RemoveBe'5\' 4"'DNYLCTvwy$  (1.626 m), weight 164 lb (74.4 kg), SpO2 98 %.    HEENT: No thrush or ulcers. Resp: Lungs clear bilaterally. Cardio: Regular rate and rhythm. GI: No hepatosplenomegaly. Vascular: Pitting edema lower leg bilaterally.    Lab Results:  Lab Results  Component Value Date   WBC 7.7 01/07/2021   HGB 11.6 (L) 01/07/2021   HCT 36.2 (L) 01/07/2021   MCV 86.0 01/07/2021   PLT 109 (L) 01/07/2021   NEUTROABS 4.6 01/07/2021    Imaging:  No results found.  Medications: I have reviewed the patient's current medications.  Assessment/Plan: Thrombocytopenia Bone marrow biopsy 07/08/2016-no evidence of B-cell lymphoma, 40% cellular bone marrow with trilineage hematopoiesis, megakaryocytes present with normal morphology, normal cytogenetics, negative for BRAF mutation Flow cytometry 01/05/2009-no monoclonal B-cell or phenotypically abnormal T-cell population Prednisone starting 06/29/2019 Nplate 07/04/2019, 8/67/7373, Nplate 07/18/2019 Prednisone 20 mg daily beginning 07/25/2019 Platelets 11,000 on 08/15/2019, prednisone increased to 40 mg daily, Nplate resume 6/68/1594  Prednisone decreased to 30 mg daily 08/25/2019, Nplate continued Prednisone continued at 30 mg daily 09/13/2019, weekly Nplate continued Prednisone taper to 20 mg daily 09/23/2019, weekly Nplate continued Prednisone taper to 10 mg daily 09/30/2019, weekly Nplate continued Prednisone taper to 5 mg daily 10/21/2019, weekly Nplate continued Prednisone taper to 5 mg every other day for 5 doses then stop 11/11/2019, weekly Nplate continued Nplate last given 09/06/6149 Promacta cost prohibitive Nplate beginning  10/04/4371, held 08/27/2020 and 09/03/2020 due to patient vacation Nplate resumed 5/78/9784 Nplate held 09/08/4126 secondary to an elevated platelet count Nplate resumed with dose reduction to 1 mcg/kg 10/09/2020 Nplate dose decreased to 0.5 mcg/kg 10/29/2020 Nplate dose increased to 1 mcg/kg 11/07/2020 Hairy cell leukemia 1982, status post splenectomy Coronary artery disease Aortic stenosis 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  We reviewed the CBC from today.  Platelet count is lower.  Plan to continue Nplate 1 mcg/kg weekly.  He understands to call with bleeding.  Hemoglobin is better.  We will contact radiology regarding the virtual colonoscopy.  We will see him in follow-up in 1 month.    [name] ANP/GNP-BC   01/14/2021  9:36 AM

## 2021-01-21 ENCOUNTER — Inpatient Hospital Stay: Payer: Medicare Other

## 2021-01-21 ENCOUNTER — Other Ambulatory Visit: Payer: Self-pay

## 2021-01-21 VITALS — BP 139/73 | HR 55 | Temp 97.8°F | Resp 18 | Wt 164.0 lb

## 2021-01-21 DIAGNOSIS — D693 Immune thrombocytopenic purpura: Secondary | ICD-10-CM

## 2021-01-21 LAB — CBC WITH DIFFERENTIAL (CANCER CENTER ONLY)
Abs Immature Granulocytes: 0.16 10*3/uL — ABNORMAL HIGH (ref 0.00–0.07)
Basophils Absolute: 0.1 10*3/uL (ref 0.0–0.1)
Basophils Relative: 1 %
Eosinophils Absolute: 0.2 10*3/uL (ref 0.0–0.5)
Eosinophils Relative: 3 %
HCT: 37.8 % — ABNORMAL LOW (ref 39.0–52.0)
Hemoglobin: 11.8 g/dL — ABNORMAL LOW (ref 13.0–17.0)
Immature Granulocytes: 2 %
Lymphocytes Relative: 16 %
Lymphs Abs: 1.3 10*3/uL (ref 0.7–4.0)
MCH: 27.3 pg (ref 26.0–34.0)
MCHC: 31.2 g/dL (ref 30.0–36.0)
MCV: 87.3 fL (ref 80.0–100.0)
Monocytes Absolute: 1.8 10*3/uL — ABNORMAL HIGH (ref 0.1–1.0)
Monocytes Relative: 21 %
Neutro Abs: 5 10*3/uL (ref 1.7–7.7)
Neutrophils Relative %: 57 %
Platelet Count: 77 10*3/uL — ABNORMAL LOW (ref 150–400)
RBC: 4.33 MIL/uL (ref 4.22–5.81)
RDW: 15.6 % — ABNORMAL HIGH (ref 11.5–15.5)
WBC Count: 8.6 10*3/uL (ref 4.0–10.5)
nRBC: 0 % (ref 0.0–0.2)

## 2021-01-21 MED ORDER — ROMIPLOSTIM 125 MCG ~~LOC~~ SOLR
1.0000 ug/kg | Freq: Once | SUBCUTANEOUS | Status: AC
  Administered 2021-01-21: 75 ug via SUBCUTANEOUS
  Filled 2021-01-21: qty 0.15

## 2021-01-21 NOTE — Patient Instructions (Signed)
Romiplostim injection ?What is this medication? ?ROMIPLOSTIM (roe mi PLOE stim) helps your body make more platelets. This medicine is used to treat low platelets caused by chronic idiopathic thrombocytopenic purpura (ITP) or a bone marrow syndrome caused by radiation sickness. ?This medicine may be used for other purposes; ask your health care provider or pharmacist if you have questions. ?COMMON BRAND NAME(S): Nplate ?What should I tell my care team before I take this medication? ?They need to know if you have any of these conditions: ?blood clots ?myelodysplastic syndrome ?an unusual or allergic reaction to romiplostim, mannitol, other medicines, foods, dyes, or preservatives ?pregnant or trying to get pregnant ?breast-feeding ?How should I use this medication? ?This medicine is injected under the skin. It is given by a health care provider in a hospital or clinic setting. ?A special MedGuide will be given to you before each treatment. Be sure to read this information carefully each time. ?Talk to your health care provider about the use of this medicine in children. While it may be prescribed for children as young as newborns for selected conditions, precautions do apply. ?Overdosage: If you think you have taken too much of this medicine contact a poison control center or emergency room at once. ?NOTE: This medicine is only for you. Do not share this medicine with others. ?What if I miss a dose? ?Keep appointments for follow-up doses. It is important not to miss your dose. Call your health care provider if you are unable to keep an appointment. ?What may interact with this medication? ?Interactions are not expected. ?This list may not describe all possible interactions. Give your health care provider a list of all the medicines, herbs, non-prescription drugs, or dietary supplements you use. Also tell them if you smoke, drink alcohol, or use illegal drugs. Some items may interact with your medicine. ?What should I  watch for while using this medication? ?Visit your health care provider for regular checks on your progress. You may need blood work done while you are taking this medicine. Your condition will be monitored carefully while you are receiving this medicine. It is important not to miss any appointments. ?What side effects may I notice from receiving this medication? ?Side effects that you should report to your doctor or health care professional as soon as possible: ?allergic reactions (skin rash, itching or hives; swelling of the face, lips, or tongue) ?bleeding (bloody or black, tarry stools; red or dark brown urine; spitting up blood or brown material that looks like coffee grounds; red spots on the skin; unusual bruising or bleeding from the eyes, gums, or nose) ?blood clot (chest pain; shortness of breath; pain, swelling, or warmth in the leg) ?stroke (changes in vision; confusion; trouble speaking or understanding; severe headaches; sudden numbness or weakness of the face, arm or leg; trouble walking; dizziness; loss of balance or coordination) ?Side effects that usually do not require medical attention (report to your doctor or health care professional if they continue or are bothersome): ?diarrhea ?dizziness ?headache ?joint pain ?muscle pain ?stomach pain ?trouble sleeping ?This list may not describe all possible side effects. Call your doctor for medical advice about side effects. You may report side effects to FDA at 1-800-FDA-1088. ?Where should I keep my medication? ?This medicine is given in a hospital or clinic. It will not be stored at home. ?NOTE: This sheet is a summary. It may not cover all possible information. If you have questions about this medicine, talk to your doctor, pharmacist, or health care provider. ??   2022 Elsevier/Gold Standard (2020-11-06 00:00:00) ? ?

## 2021-01-28 ENCOUNTER — Other Ambulatory Visit: Payer: Self-pay

## 2021-01-28 ENCOUNTER — Inpatient Hospital Stay: Payer: Medicare Other

## 2021-01-28 VITALS — BP 146/83 | HR 59 | Temp 97.5°F | Resp 18 | Wt 166.1 lb

## 2021-01-28 DIAGNOSIS — D693 Immune thrombocytopenic purpura: Secondary | ICD-10-CM

## 2021-01-28 LAB — CBC WITH DIFFERENTIAL (CANCER CENTER ONLY)
Abs Immature Granulocytes: 0.14 10*3/uL — ABNORMAL HIGH (ref 0.00–0.07)
Basophils Absolute: 0.1 10*3/uL (ref 0.0–0.1)
Basophils Relative: 1 %
Eosinophils Absolute: 0.2 10*3/uL (ref 0.0–0.5)
Eosinophils Relative: 3 %
HCT: 36.7 % — ABNORMAL LOW (ref 39.0–52.0)
Hemoglobin: 11.8 g/dL — ABNORMAL LOW (ref 13.0–17.0)
Immature Granulocytes: 2 %
Lymphocytes Relative: 20 %
Lymphs Abs: 1.6 10*3/uL (ref 0.7–4.0)
MCH: 28.1 pg (ref 26.0–34.0)
MCHC: 32.2 g/dL (ref 30.0–36.0)
MCV: 87.4 fL (ref 80.0–100.0)
Monocytes Absolute: 1.5 10*3/uL — ABNORMAL HIGH (ref 0.1–1.0)
Monocytes Relative: 19 %
Neutro Abs: 4.5 10*3/uL (ref 1.7–7.7)
Neutrophils Relative %: 55 %
Platelet Count: 81 10*3/uL — ABNORMAL LOW (ref 150–400)
RBC: 4.2 MIL/uL — ABNORMAL LOW (ref 4.22–5.81)
RDW: 15.7 % — ABNORMAL HIGH (ref 11.5–15.5)
WBC Count: 8.1 10*3/uL (ref 4.0–10.5)
nRBC: 0 % (ref 0.0–0.2)

## 2021-01-28 MED ORDER — ROMIPLOSTIM 125 MCG ~~LOC~~ SOLR
1.0000 ug/kg | Freq: Once | SUBCUTANEOUS | Status: AC
  Administered 2021-01-28: 10:00:00 75 ug via SUBCUTANEOUS
  Filled 2021-01-28: qty 0.15

## 2021-01-28 NOTE — Patient Instructions (Signed)
Romiplostim injection ?What is this medication? ?ROMIPLOSTIM (roe mi PLOE stim) helps your body make more platelets. This medicine is used to treat low platelets caused by chronic idiopathic thrombocytopenic purpura (ITP) or a bone marrow syndrome caused by radiation sickness. ?This medicine may be used for other purposes; ask your health care provider or pharmacist if you have questions. ?COMMON BRAND NAME(S): Nplate ?What should I tell my care team before I take this medication? ?They need to know if you have any of these conditions: ?blood clots ?myelodysplastic syndrome ?an unusual or allergic reaction to romiplostim, mannitol, other medicines, foods, dyes, or preservatives ?pregnant or trying to get pregnant ?breast-feeding ?How should I use this medication? ?This medicine is injected under the skin. It is given by a health care provider in a hospital or clinic setting. ?A special MedGuide will be given to you before each treatment. Be sure to read this information carefully each time. ?Talk to your health care provider about the use of this medicine in children. While it may be prescribed for children as young as newborns for selected conditions, precautions do apply. ?Overdosage: If you think you have taken too much of this medicine contact a poison control center or emergency room at once. ?NOTE: This medicine is only for you. Do not share this medicine with others. ?What if I miss a dose? ?Keep appointments for follow-up doses. It is important not to miss your dose. Call your health care provider if you are unable to keep an appointment. ?What may interact with this medication? ?Interactions are not expected. ?This list may not describe all possible interactions. Give your health care provider a list of all the medicines, herbs, non-prescription drugs, or dietary supplements you use. Also tell them if you smoke, drink alcohol, or use illegal drugs. Some items may interact with your medicine. ?What should I  watch for while using this medication? ?Visit your health care provider for regular checks on your progress. You may need blood work done while you are taking this medicine. Your condition will be monitored carefully while you are receiving this medicine. It is important not to miss any appointments. ?What side effects may I notice from receiving this medication? ?Side effects that you should report to your doctor or health care professional as soon as possible: ?allergic reactions (skin rash, itching or hives; swelling of the face, lips, or tongue) ?bleeding (bloody or black, tarry stools; red or dark brown urine; spitting up blood or brown material that looks like coffee grounds; red spots on the skin; unusual bruising or bleeding from the eyes, gums, or nose) ?blood clot (chest pain; shortness of breath; pain, swelling, or warmth in the leg) ?stroke (changes in vision; confusion; trouble speaking or understanding; severe headaches; sudden numbness or weakness of the face, arm or leg; trouble walking; dizziness; loss of balance or coordination) ?Side effects that usually do not require medical attention (report to your doctor or health care professional if they continue or are bothersome): ?diarrhea ?dizziness ?headache ?joint pain ?muscle pain ?stomach pain ?trouble sleeping ?This list may not describe all possible side effects. Call your doctor for medical advice about side effects. You may report side effects to FDA at 1-800-FDA-1088. ?Where should I keep my medication? ?This medicine is given in a hospital or clinic. It will not be stored at home. ?NOTE: This sheet is a summary. It may not cover all possible information. If you have questions about this medicine, talk to your doctor, pharmacist, or health care provider. ??   2022 Elsevier/Gold Standard (2020-11-06 00:00:00) ? ?

## 2021-02-04 ENCOUNTER — Other Ambulatory Visit: Payer: Self-pay

## 2021-02-04 ENCOUNTER — Inpatient Hospital Stay: Payer: Medicare Other | Attending: Nurse Practitioner

## 2021-02-04 ENCOUNTER — Inpatient Hospital Stay: Payer: Medicare Other

## 2021-02-04 VITALS — BP 144/82 | HR 54 | Temp 98.2°F | Resp 20 | Ht 64.0 in | Wt 164.8 lb

## 2021-02-04 DIAGNOSIS — D693 Immune thrombocytopenic purpura: Secondary | ICD-10-CM

## 2021-02-04 LAB — CBC WITH DIFFERENTIAL (CANCER CENTER ONLY)
Abs Immature Granulocytes: 0.1 10*3/uL — ABNORMAL HIGH (ref 0.00–0.07)
Basophils Absolute: 0.1 10*3/uL (ref 0.0–0.1)
Basophils Relative: 2 %
Eosinophils Absolute: 0.1 10*3/uL (ref 0.0–0.5)
Eosinophils Relative: 2 %
HCT: 36.4 % — ABNORMAL LOW (ref 39.0–52.0)
Hemoglobin: 11.5 g/dL — ABNORMAL LOW (ref 13.0–17.0)
Immature Granulocytes: 2 %
Lymphocytes Relative: 17 %
Lymphs Abs: 1.2 10*3/uL (ref 0.7–4.0)
MCH: 27.5 pg (ref 26.0–34.0)
MCHC: 31.6 g/dL (ref 30.0–36.0)
MCV: 87.1 fL (ref 80.0–100.0)
Monocytes Absolute: 1.3 10*3/uL — ABNORMAL HIGH (ref 0.1–1.0)
Monocytes Relative: 19 %
Neutro Abs: 4 10*3/uL (ref 1.7–7.7)
Neutrophils Relative %: 58 %
Platelet Count: 53 10*3/uL — ABNORMAL LOW (ref 150–400)
RBC: 4.18 MIL/uL — ABNORMAL LOW (ref 4.22–5.81)
RDW: 15.7 % — ABNORMAL HIGH (ref 11.5–15.5)
WBC Count: 6.8 10*3/uL (ref 4.0–10.5)
nRBC: 0 % (ref 0.0–0.2)

## 2021-02-04 MED ORDER — ROMIPLOSTIM 125 MCG ~~LOC~~ SOLR
1.0000 ug/kg | Freq: Once | SUBCUTANEOUS | Status: AC
  Administered 2021-02-04: 75 ug via SUBCUTANEOUS
  Filled 2021-02-04: qty 0.15

## 2021-02-04 NOTE — Patient Instructions (Signed)
Romiplostim injection ?What is this medication? ?ROMIPLOSTIM (roe mi PLOE stim) helps your body make more platelets. This medicine is used to treat low platelets caused by chronic idiopathic thrombocytopenic purpura (ITP) or a bone marrow syndrome caused by radiation sickness. ?This medicine may be used for other purposes; ask your health care provider or pharmacist if you have questions. ?COMMON BRAND NAME(S): Nplate ?What should I tell my care team before I take this medication? ?They need to know if you have any of these conditions: ?blood clots ?myelodysplastic syndrome ?an unusual or allergic reaction to romiplostim, mannitol, other medicines, foods, dyes, or preservatives ?pregnant or trying to get pregnant ?breast-feeding ?How should I use this medication? ?This medicine is injected under the skin. It is given by a health care provider in a hospital or clinic setting. ?A special MedGuide will be given to you before each treatment. Be sure to read this information carefully each time. ?Talk to your health care provider about the use of this medicine in children. While it may be prescribed for children as young as newborns for selected conditions, precautions do apply. ?Overdosage: If you think you have taken too much of this medicine contact a poison control center or emergency room at once. ?NOTE: This medicine is only for you. Do not share this medicine with others. ?What if I miss a dose? ?Keep appointments for follow-up doses. It is important not to miss your dose. Call your health care provider if you are unable to keep an appointment. ?What may interact with this medication? ?Interactions are not expected. ?This list may not describe all possible interactions. Give your health care provider a list of all the medicines, herbs, non-prescription drugs, or dietary supplements you use. Also tell them if you smoke, drink alcohol, or use illegal drugs. Some items may interact with your medicine. ?What should I  watch for while using this medication? ?Visit your health care provider for regular checks on your progress. You may need blood work done while you are taking this medicine. Your condition will be monitored carefully while you are receiving this medicine. It is important not to miss any appointments. ?What side effects may I notice from receiving this medication? ?Side effects that you should report to your doctor or health care professional as soon as possible: ?allergic reactions (skin rash, itching or hives; swelling of the face, lips, or tongue) ?bleeding (bloody or black, tarry stools; red or dark brown urine; spitting up blood or brown material that looks like coffee grounds; red spots on the skin; unusual bruising or bleeding from the eyes, gums, or nose) ?blood clot (chest pain; shortness of breath; pain, swelling, or warmth in the leg) ?stroke (changes in vision; confusion; trouble speaking or understanding; severe headaches; sudden numbness or weakness of the face, arm or leg; trouble walking; dizziness; loss of balance or coordination) ?Side effects that usually do not require medical attention (report to your doctor or health care professional if they continue or are bothersome): ?diarrhea ?dizziness ?headache ?joint pain ?muscle pain ?stomach pain ?trouble sleeping ?This list may not describe all possible side effects. Call your doctor for medical advice about side effects. You may report side effects to FDA at 1-800-FDA-1088. ?Where should I keep my medication? ?This medicine is given in a hospital or clinic. It will not be stored at home. ?NOTE: This sheet is a summary. It may not cover all possible information. If you have questions about this medicine, talk to your doctor, pharmacist, or health care provider. ??   2022 Elsevier/Gold Standard (2020-11-06 00:00:00) ? ?

## 2021-02-05 ENCOUNTER — Emergency Department (HOSPITAL_BASED_OUTPATIENT_CLINIC_OR_DEPARTMENT_OTHER)
Admission: EM | Admit: 2021-02-05 | Discharge: 2021-02-05 | Disposition: A | Discharge status: Home / Self Care | Payer: Medicare Other | Attending: Emergency Medicine | Admitting: Emergency Medicine

## 2021-02-05 ENCOUNTER — Encounter (HOSPITAL_BASED_OUTPATIENT_CLINIC_OR_DEPARTMENT_OTHER): Payer: Self-pay | Admitting: Emergency Medicine

## 2021-02-05 ENCOUNTER — Other Ambulatory Visit: Payer: Self-pay

## 2021-02-05 ENCOUNTER — Emergency Department (HOSPITAL_BASED_OUTPATIENT_CLINIC_OR_DEPARTMENT_OTHER): Payer: Medicare Other

## 2021-02-05 DIAGNOSIS — S20219A Contusion of unspecified front wall of thorax, initial encounter: Secondary | ICD-10-CM | POA: Diagnosis not present

## 2021-02-05 DIAGNOSIS — S199XXA Unspecified injury of neck, initial encounter: Secondary | ICD-10-CM | POA: Diagnosis not present

## 2021-02-05 DIAGNOSIS — S01511A Laceration without foreign body of lip, initial encounter: Secondary | ICD-10-CM | POA: Diagnosis not present

## 2021-02-05 DIAGNOSIS — I5033 Acute on chronic diastolic (congestive) heart failure: Secondary | ICD-10-CM | POA: Insufficient documentation

## 2021-02-05 DIAGNOSIS — Z85038 Personal history of other malignant neoplasm of large intestine: Secondary | ICD-10-CM | POA: Diagnosis not present

## 2021-02-05 DIAGNOSIS — S0990XA Unspecified injury of head, initial encounter: Secondary | ICD-10-CM | POA: Diagnosis present

## 2021-02-05 DIAGNOSIS — S01501A Unspecified open wound of lip, initial encounter: Secondary | ICD-10-CM | POA: Diagnosis not present

## 2021-02-05 DIAGNOSIS — I2510 Atherosclerotic heart disease of native coronary artery without angina pectoris: Secondary | ICD-10-CM | POA: Insufficient documentation

## 2021-02-05 DIAGNOSIS — S0993XA Unspecified injury of face, initial encounter: Secondary | ICD-10-CM | POA: Diagnosis not present

## 2021-02-05 DIAGNOSIS — I110 Hypertensive heart disease with heart failure: Secondary | ICD-10-CM | POA: Diagnosis not present

## 2021-02-05 DIAGNOSIS — Z79899 Other long term (current) drug therapy: Secondary | ICD-10-CM | POA: Insufficient documentation

## 2021-02-05 DIAGNOSIS — S61512A Laceration without foreign body of left wrist, initial encounter: Secondary | ICD-10-CM | POA: Insufficient documentation

## 2021-02-05 DIAGNOSIS — W19XXXA Unspecified fall, initial encounter: Secondary | ICD-10-CM

## 2021-02-05 DIAGNOSIS — Z955 Presence of coronary angioplasty implant and graft: Secondary | ICD-10-CM | POA: Diagnosis not present

## 2021-02-05 DIAGNOSIS — S61511A Laceration without foreign body of right wrist, initial encounter: Secondary | ICD-10-CM | POA: Insufficient documentation

## 2021-02-05 DIAGNOSIS — Z87891 Personal history of nicotine dependence: Secondary | ICD-10-CM | POA: Diagnosis not present

## 2021-02-05 DIAGNOSIS — W01198A Fall on same level from slipping, tripping and stumbling with subsequent striking against other object, initial encounter: Secondary | ICD-10-CM | POA: Diagnosis not present

## 2021-02-05 NOTE — Discharge Instructions (Addendum)
Your CT imaging was within normal limits.   You will need to follow up with your primary care physician as needed.  If you experience any worsening symptoms please return to the ED.

## 2021-02-05 NOTE — ED Triage Notes (Signed)
Fell yesterday  hit face ,denies LOC  has a place on his lip that has been oozing  denies dizziness, sen yesterday  for low platelets

## 2021-02-05 NOTE — ED Provider Notes (Signed)
[facility] DEPT Provider Note   CSN: [id number] Arrival date & time: 02/05/21  1121     History Chief Complaint  Patient presents with   Jamichael Knotts is a 87 y.o. male.  87 y.o male with a PMH of HTN, Leukemia presents to the ED s/p mechanical fall x  yesterday.  Patient reports he was taking out the garbage, when he was pushing the garbage can, and suddenly fell inside the garbage can, striking his lip around the garbage lid.  Wife at the bedside reports a good amount of bleeding to his upper lip, some scrapes to his right wrist, left wrist.  She cleaned the wound with soap and water, apply butterfly strips to bilateral wrist.  Last tetanus immunization is less than 10 years.  Patient does have a prior history of low platelet, followed y Dr. Learta Codding. He reports no headache, no chest pain, no dizziness, no other injuries.     The history is provided by the patient and the spouse.  Fall This is a new problem. The current episode started yesterday. The problem occurs rarely. The problem has been resolved. Pertinent negatives include no chest pain, no abdominal pain, no headaches and no shortness of breath. He has tried a cold compress for the symptoms.      Past Medical History:  Diagnosis Date   Acute appendicitis    Anemia    Anxiety    Arthritis    "back" (03/14/2014)   Blood dyscrasia    hairy cell leukemia   CAD (coronary artery disease)    a. 03/14/14  s/p overlapping DES x2 to mid-distal RCA.   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    Colon cancer ([place]) 1984   Compression fracture of lumbar spine, non-traumatic (HCC)    DJD (degenerative joint disease) of lumbar spine    Dyslipidemia    Dyspnea    Elevated PSA    GERD (gastroesophageal reflux disease)    Glaucoma    Hairy cell leukemia ([place]) dx'd 1980   Heart murmur    History of blood transfusion    "several; related to hairy cell leukemia & tx "   History of  stomach ulcers 1968   Hypertension    Leukemia, hairy cell ([place])    Malnutrition ([place])    Osteoarthritis    Osteoporosis    Pneumonia    S/P TAVR (transcatheter aortic valve replacement) 09/06/2019   s/p TAVR with a 26 mm Edwards S3U via the TF approach by Dr. Angelena Form and Cyndia Bent.    Severe aortic stenosis    s/p tavr   Skin cancer of face     Patient Active Problem List   Diagnosis Date Noted   History of bacteremia    Pseudomonas aeruginosa infection 09/17/2019   Paroxysmal atrial fibrillation ([place]) 09/17/2019   Stroke (cerebrum) ([place]) 09/16/2019   Urinary tract infection without hematuria    Acute on chronic diastolic heart failure ([place]) 09/06/2019   Bifascicular block 09/06/2019   S/P TAVR (transcatheter aortic valve replacement) 09/06/2019   Severe aortic stenosis    Chronic ITP (idiopathic thrombocytopenia) (HCC) 07/04/2019   Chronic eczematous otitis externa of both ears 01/15/2016   Sensorineural hearing loss (SNHL) of both ears 01/15/2016   Prostate nodule 03/09/2015   CAD (coronary artery disease)    GERD (gastroesophageal reflux disease)    Hypertension    Dyslipidemia    Colon cancer ([place])    Solitary pulmonary nodule 02/11/2013  Leukemic reticuloendotheliosis, extranodal and solid organ sites [facility]) 01/03/2013   Shortness of breath 01/03/2013   Glaucoma 11/27/2011   Personal history of other malignant neoplasm of skin 10/14/2011   Elevated PSA 09/12/2011   Liposarcoma, well differentiated type ([place]) 09/12/2011   Traction detachment of retina 11/18/2010   Exudative age-related macular degeneration ([place]) 04/16/2010   Epiretinal membrane 04/16/2010   Vitreous degeneration 04/16/2010    Past Surgical History:  Procedure Laterality Date   APPENDECTOMY  10/2007   BUBBLE STUDY  11/02/2019   Procedure: BUBBLE STUDY;  Surgeon: Elouise Munroe, MD;  Location: [facility];  Service: Cardiology;;   CATARACT EXTRACTION, BILATERAL Bilateral 02/2008   COLON SURGERY   1984   "sigmoid; open"   CORONARY ANGIOPLASTY WITH STENT PLACEMENT  03/14/2014   "2"   EYE SURGERY Bilateral    cataract   FRACTURE SURGERY     LEFT HEART CATHETERIZATION WITH CORONARY ANGIOGRAM N/A 03/14/2014   Procedure: LEFT HEART CATHETERIZATION WITH CORONARY ANGIOGRAM;  Surgeon: Peter M Martinique, MD;  Location: [facility] CATH LAB;  Service: Cardiovascular;  Laterality: N/A;   LIPOMA EXCISION Right 07/2011   liposarcoma resection; "back"   MOHS SURGERY Left 02/2011   MOHS SURGERY  X 3   "all on my face"   MOLE REMOVAL Left 1985   cheek   ORIF ANKLE FRACTURE Left 2000   RIGHT/LEFT HEART CATH AND CORONARY ANGIOGRAPHY N/A 07/26/2019   Procedure: RIGHT/LEFT HEART CATH AND CORONARY ANGIOGRAPHY;  Surgeon: Belva Crome, MD;  Location: [facility] CV LAB;  Service: Cardiovascular;  Laterality: N/A;   SHOULDER SURGERY  04/2004   SPLENECTOMY  1992   TEE WITHOUT CARDIOVERSION N/A 09/06/2019   Procedure: TRANSESOPHAGEAL ECHOCARDIOGRAM (TEE);  Surgeon: Burnell Blanks, MD;  Location: [facility] CV LAB;  Service: Open Heart Surgery;  Laterality: N/A;   TEE WITHOUT CARDIOVERSION N/A 11/02/2019   Procedure: TRANSESOPHAGEAL ECHOCARDIOGRAM (TEE);  Surgeon: Elouise Munroe, MD;  Location: [facility];  Service: Cardiology;  Laterality: N/A;   TONSILLECTOMY AND ADENOIDECTOMY  1939   TRANSCATHETER AORTIC VALVE REPLACEMENT, TRANSFEMORAL N/A 09/06/2019   Procedure: TRANSCATHETER AORTIC VALVE REPLACEMENT, TRANSFEMORAL;  Surgeon: Burnell Blanks, MD;  Location: [facility] CV LAB;  Service: Open Heart Surgery;  Laterality: N/A;       Family History  Problem Relation Age of Onset   Congestive Heart Failure Father 68   Stroke Mother    Diabetes Mellitus II Brother    Prostate cancer Brother    Heart Problems Brother        CABG    Social History   Tobacco Use   Smoking status: Former    Packs/day: 0.25    Years: 1.00    Pack years: 0.25    Types: Cigarettes, Cigars   Smokeless tobacco:  Never   Tobacco comments:    occasional social smoker during college.  Vaping Use   Vaping Use: Never used  Substance Use Topics   Alcohol use: Yes    Alcohol/week: 9.0 standard drinks    Types: 2 Glasses of wine, 2 Shots of liquor, 5 Standard drinks or equivalent per week    Comment: socially   Drug use: No    Home Medications Prior to Admission medications   Medication Sig Start Date End Date Taking? Authorizing Provider  acetaminophen (TYLENOL) 500 MG tablet Take 1,000 mg by mouth every 8 (eight) hours as needed for moderate pain.    [provider]  amoxicillin (AMOXIL) 500 MG tablet  Take 4 capsules (2,000 mg) one hour prior to all dental visits. 07/03/20   Eileen Stanford, PA-C  atorvastatin (LIPITOR) 20 MG tablet TAKE 1 TABLET BY MOUTH DAILY AT 6 PM 09/27/20   Belva Crome, MD  Cholecalciferol (VITAMIN D3) 50 MCG (2000 UT) TABS Take 2,000 Units by mouth every evening.    [provider]  COVID-19 mRNA bivalent vaccine, Pfizer, (PFIZER COVID-19 VAC BIVALENT) injection Inject into the muscle. 12/20/20     desonide (DESOWEN) 0.05 % cream Apply 1 application topically 2 (two) times daily as needed (rash/irritation.). Only takes as needed 01/28/16   [provider]  dexamethasone (DECADRON) 0.1 % ophthalmic solution Place 1 drop into both eyes. 08/05/20   [provider]  finasteride (PROSCAR) 5 MG tablet Take 5 mg by mouth daily. 11/10/19   [provider]  fluocinonide (LIDEX) 0.05 % external solution Apply 1 application topically 2 (two) times daily as needed (apply to ears).  05/31/15   [provider]  latanoprost (XALATAN) 0.005 % ophthalmic solution Place 1 drop into both eyes at bedtime.     [provider]  metoprolol succinate (TOPROL-XL) 12.5 mg TB24 24 hr tablet Take 12.5 mg by mouth daily.    [provider]  metoprolol tartrate (LOPRESSOR) 12.5 mg TABS tablet Take 12.5 mg by mouth daily. Pt reported taking  half a tablet daily 10/09/20    [provider]  Multiple Vitamins-Minerals (PRESERVISION AREDS) CAPS 1 tablet 2 (two) times daily. 07/19/09   [provider]  mupirocin ointment (BACTROBAN) 2 % Apply to affected area    [provider]  nitroGLYCERIN (NITROSTAT) 0.4 MG SL tablet PLACE AND DISSOLVE 1 TABLET UNDER THE TONGUE EVERY 5 MINUTES AS NEEDED FOR CHEST PAIN FOR UP TO 3 DOSES, CALL 911 IF NO RELIEF AFTER 1ST DOSE 08/06/20   Belva Crome, MD  pantoprazole (PROTONIX) 40 MG tablet Take 1 tablet (40 mg total) by mouth daily. 05/01/20   Belva Crome, MD  ranibizumab (LUCENTIS) 0.5 MG/0.05ML SOLN 0.5 mg by Intravitreal route every 6 (six) weeks. Left Eye ONLY 10/11/13   [provider]  romiPLOStim (NPLATE) 250 MCG injection Inject into the skin once a week.    [provider]  sertraline (ZOLOFT) 50 MG tablet Take 50 mg by mouth every evening.     [provider]  tamsulosin (FLOMAX) 0.4 MG CAPS capsule Take 0.8 mg by mouth daily after supper.     [provider]  timolol (BETIMOL) 0.5 % ophthalmic solution Place 1 drop into the right eye daily.     [provider]    Allergies    Antazoline; Antihistamines, chlorpheniramine-type; Calcitonin (salmon); and Sulfa antibiotics  Review of Systems   Review of Systems  Constitutional:  Negative for chills and fever.  HENT:  Negative for sore throat.   Respiratory:  Negative for shortness of breath.   Cardiovascular:  Negative for chest pain.  Gastrointestinal:  Negative for abdominal pain.  Skin:  Positive for wound.  Neurological:  Negative for dizziness, weakness and headaches.  All other systems reviewed and are negative.  Physical Exam Updated Vital Signs BP (!) 163/98   Pulse (!) 51   Temp 98.1 F (36.7 C)   Resp 16   Ht 5\' 4"  (1.626 m)   Wt 74.5 kg   SpO2 98%   BMI 28.19 kg/m   Physical Exam Vitals and nursing note reviewed.  Constitutional:  Appearance:  Normal appearance.  HENT:     Head: Normocephalic.     Nose: Nose normal.     Mouth/Throat:     Mouth: Mucous membranes are moist.     Pharynx: Oropharynx is clear. Uvula midline.     Tonsils: No tonsillar exudate or tonsillar abscesses.   Eyes:     Pupils: Pupils are equal, round, and reactive to light.  Neck:     Comments: Full ROM of the neck without c spine midline tenderness.  Cardiovascular:     Rate and Rhythm: Normal rate.  Pulmonary:     Effort: Pulmonary effort is normal.     Comments: Bruising noted to the sternum, but no ttp.  Abdominal:     General: Abdomen is flat.     Tenderness: There is no abdominal tenderness. There is no right CVA tenderness or left CVA tenderness.  Musculoskeletal:     Right wrist: Laceration present.     Left wrist: Laceration present.     Cervical back: Normal range of motion and neck supple.     Comments: Small  less than 1 cm lacerations to BL wrists, bleeding is controlled. Both repaired with butterfly strips by wife at the bedside.   Neurological:     Mental Status: He is alert and oriented to person, place, and time.     Comments: Alert, oriented, thought content appropriate. Speech fluent without evidence of aphasia. Able to follow 2 step commands without difficulty.  Cranial Nerves:  II:  Peripheral visual fields grossly normal, pupils, round, reactive to light III,IV, VI: ptosis not present, extra-ocular motions intact bilaterally  V,VII: smile symmetric, facial light touch sensation equal VIII: hearing grossly normal bilaterally  IX,X: midline uvula rise  XI: bilateral shoulder shrug equal and strong XII: midline tongue extension  Motor:  5/5 in upper and lower extremities bilaterally including strong and equal grip strength and dorsiflexion/plantar flexion Sensory: light touch normal in all extremities.  Cerebellar: normal finger-to-nose with bilateral upper extremities, pronator drift negative       ED Results /  Procedures / Treatments   Labs (all labs ordered are listed, but only abnormal results are displayed) Labs Reviewed - No data to display  EKG None  Radiology CT Head Wo Contrast  Result Date: 02/05/2021 CLINICAL DATA:  Fall yesterday, hit face, history of thrombocytopenia EXAM: CT HEAD WITHOUT CONTRAST CT MAXILLOFACIAL WITHOUT CONTRAST TECHNIQUE: Multidetector CT imaging of the head and maxillofacial structures were performed using the standard protocol without intravenous contrast. Multiplanar CT image reconstructions of the maxillofacial structures were also generated. COMPARISON:  CT head 09/16/2019 FINDINGS: CT HEAD FINDINGS Brain: There is no evidence of acute intracranial hemorrhage, extra-axial fluid collection, or acute infarct. Encephalomalacia in the left frontal lobe has progressed from prior, consistent with evolved remote infarct. There is mild for age global parenchymal volume loss. Patchy hypodensity in the remainder of the subcortical and periventricular white matter likely reflects sequela of chronic white matter microangiopathy. There is no solid mass lesion.  There is no midline shift. Vascular: There is calcification of the bilateral cavernous ICAs. Skull: Normal. Negative for fracture or focal lesion. Other: None. CT MAXILLOFACIAL FINDINGS Osseous: There is no acute facial bone fracture. There is no evidence of mandibular dislocation. There is no suspicious osseous lesion. There is degenerative change of the temporomandibular joints, left worse than right. Multiple mandibular exostoses are noted. Orbits: Bilateral lens implants are noted. The globes and orbits are otherwise unremarkable. Sinuses: There is mild  mucosal thickening in the paranasal sinuses. Soft tissues: There is a small metallic density foreign body in the subcutaneous fat inferior to the angle of the right hemi mandible without surrounding swelling or inflammatory changes. IMPRESSION: 1. No acute intracranial  hemorrhage or calvarial fracture. 2. Remote infarct in the left frontal lobe. 3. No acute facial bone fracture. 4. Small metallic density foreign body in the soft tissues inferior to the angle of the right hemi mandible without surrounding swelling or inflammatory changes. Correlate with history and physical exam. Electronically Signed   By: Valetta Mole M.D.   On: 02/05/2021 13:53   CT Cervical Spine Wo Contrast  Result Date: 02/05/2021 CLINICAL DATA:  Neck trauma (Age >= 65y) EXAM: CT CERVICAL SPINE WITHOUT CONTRAST TECHNIQUE: Multidetector CT imaging of the cervical spine was performed without intravenous contrast. Multiplanar CT image reconstructions were also generated. COMPARISON:  None. FINDINGS: Alignment: Slight anterolisthesis of C2 on C3. Otherwise, no substantial sagittal subluxation. Mild widening anterior disc space at C6-C7 is chronic given similar appearance on CT chest from Jul 29, 2019. Skull base and vertebrae: No evidence of acute fracture. Vertebral body heights are maintained. Osteopenia. Sclerotic C3 vertebral body lesion, favored to be benign bone island given the marked density with average Hounsfield unit over 1,400 (ref: [email or web address].com). Soft tissues and spinal canal: No prevertebral fluid or swelling. No visible canal hematoma. Disc levels: Severe degenerative disease at C5-C6 with disc height loss, endplate sclerosis and posterior endplate spurring. Multilevel facet/uncovertebral hypertrophy with varying degrees of neural foraminal stenosis, at least moderate multiple levels. Upper chest: Visualized lung apices are clear. Other: Approximately 1.1 cm right thyroid nodule, which is not require further imaging follow-up (ref: J Am Coll Radiol. 2015 Feb;12(2): 143-50). IMPRESSION: 1. No evidence of acute fracture or traumatic malalignment. 2. Multilevel degenerative change with at least moderate foraminal stenosis at multiple levels. MRI could  further characterize if clinically indicated. 3. Osteopenia. Electronically Signed   By: Margaretha Sheffield M.D.   On: 02/05/2021 14:09   CT Maxillofacial Wo Contrast  Result Date: 02/05/2021 CLINICAL DATA:  Fall yesterday, hit face, history of thrombocytopenia EXAM: CT HEAD WITHOUT CONTRAST CT MAXILLOFACIAL WITHOUT CONTRAST TECHNIQUE: Multidetector CT imaging of the head and maxillofacial structures were performed using the standard protocol without intravenous contrast. Multiplanar CT image reconstructions of the maxillofacial structures were also generated. COMPARISON:  CT head 09/16/2019 FINDINGS: CT HEAD FINDINGS Brain: There is no evidence of acute intracranial hemorrhage, extra-axial fluid collection, or acute infarct. Encephalomalacia in the left frontal lobe has progressed from prior, consistent with evolved remote infarct. There is mild for age global parenchymal volume loss. Patchy hypodensity in the remainder of the subcortical and periventricular white matter likely reflects sequela of chronic white matter microangiopathy. There is no solid mass lesion.  There is no midline shift. Vascular: There is calcification of the bilateral cavernous ICAs. Skull: Normal. Negative for fracture or focal lesion. Other: None. CT MAXILLOFACIAL FINDINGS Osseous: There is no acute facial bone fracture. There is no evidence of mandibular dislocation. There is no suspicious osseous lesion. There is degenerative change of the temporomandibular joints, left worse than right. Multiple mandibular exostoses are noted. Orbits: Bilateral lens implants are noted. The globes and orbits are otherwise unremarkable. Sinuses: There is mild mucosal thickening in the paranasal sinuses. Soft tissues: There is a small metallic density foreign body in the subcutaneous fat inferior to the angle of the right hemi mandible without surrounding swelling or inflammatory changes. IMPRESSION: 1. No acute intracranial  hemorrhage or calvarial  fracture. 2. Remote infarct in the left frontal lobe. 3. No acute facial bone fracture. 4. Small metallic density foreign body in the soft tissues inferior to the angle of the right hemi mandible without surrounding swelling or inflammatory changes. Correlate with history and physical exam. Electronically Signed   By: Valetta Mole M.D.   On: 02/05/2021 13:53    Procedures Procedures   Medications Ordered in ED Medications - No data to display  ED Course  I have reviewed the triage vital signs and the nursing notes.  Pertinent labs & imaging results that were available during my care of the patient were reviewed by me and considered in my medical decision making (see chart for details).    MDM Rules/Calculators/A&P   Patient presents to the ED status post mechanical fall yesterday.  Wounds to the upper lip, lower lip, right wrist, left wrist.  Wife was able to take care of wounds at home and clean them with water along with soap.  Last tetanus immunization less than 10 years per patient.  There was no presyncopal episode, no dizziness, no chest pain, no shortness of breath, no headaches.  He is currently on no blood thinners.  Neuro exam is unremarkable, moves all upper and lower extremities.  Wound appearance healing, does have a prior history of low platelets, wife reports the bleeding was only able to be controlled with pressure.  Vitals are otherwise within normal limits.  Patient denies any other complaints at this time.  Imaging was ordered by me while patient was in waiting room.  CT maxillofacial:  1. No acute intracranial hemorrhage or calvarial fracture.  2. Remote infarct in the left frontal lobe.  3. No acute facial bone fracture.  4. Small metallic density foreign body in the soft tissues inferior  to the angle of the right hemi mandible without surrounding swelling  or inflammatory changes. Correlate with history and physical exam.      CT Head:  1. No acute intracranial  hemorrhage or calvarial fracture.  2. Remote infarct in the left frontal lobe.  3. No acute facial bone fracture.  4. Small metallic density foreign body in the soft tissues inferior  to the angle of the right hemi mandible without surrounding swelling  or inflammatory changes. Correlate with history and physical exam.         CT Cervical spine:  1. No evidence of acute fracture or traumatic malalignment.  2. Multilevel degenerative change with at least moderate foraminal  stenosis at multiple levels. MRI could further characterize if  clinically indicated.  3. Osteopenia.         Does have a prior history of an infarct, this is a known issue for them.  He is without any complaint at this time.  We discussed symptomatic treatment with Motrin along with Tylenol.  Patient along with wife understand and agree with management.  Patient is stable for discharge at this time.  Portions of this note were generated with [employer]. Dictation errors may occur despite best attempts at proofreading.  Final Clinical Impression(s) / ED Diagnoses Final diagnoses:  Fall, initial encounter    Rx / DC Orders ED Discharge Orders     None        Janeece Fitting, PA-C 02/05/21 [place], [place], DO 02/06/21 1644

## 2021-02-11 ENCOUNTER — Other Ambulatory Visit: Payer: Self-pay

## 2021-02-11 ENCOUNTER — Inpatient Hospital Stay (HOSPITAL_BASED_OUTPATIENT_CLINIC_OR_DEPARTMENT_OTHER): Payer: Medicare Other | Admitting: Oncology

## 2021-02-11 ENCOUNTER — Inpatient Hospital Stay: Payer: Medicare Other

## 2021-02-11 VITALS — BP 127/67 | HR 60 | Temp 97.8°F | Resp 18 | Ht 64.0 in | Wt 167.2 lb

## 2021-02-11 DIAGNOSIS — D693 Immune thrombocytopenic purpura: Secondary | ICD-10-CM

## 2021-02-11 LAB — CBC WITH DIFFERENTIAL (CANCER CENTER ONLY)
Abs Immature Granulocytes: 0.19 10*3/uL — ABNORMAL HIGH (ref 0.00–0.07)
Basophils Absolute: 0.1 10*3/uL (ref 0.0–0.1)
Basophils Relative: 1 %
Eosinophils Absolute: 0.2 10*3/uL (ref 0.0–0.5)
Eosinophils Relative: 2 %
HCT: 36.4 % — ABNORMAL LOW (ref 39.0–52.0)
Hemoglobin: 11.5 g/dL — ABNORMAL LOW (ref 13.0–17.0)
Immature Granulocytes: 2 %
Lymphocytes Relative: 18 %
Lymphs Abs: 1.4 10*3/uL (ref 0.7–4.0)
MCH: 27.8 pg (ref 26.0–34.0)
MCHC: 31.6 g/dL (ref 30.0–36.0)
MCV: 88.1 fL (ref 80.0–100.0)
Monocytes Absolute: 1.5 10*3/uL — ABNORMAL HIGH (ref 0.1–1.0)
Monocytes Relative: 20 %
Neutro Abs: 4.4 10*3/uL (ref 1.7–7.7)
Neutrophils Relative %: 57 %
Platelet Count: 72 10*3/uL — ABNORMAL LOW (ref 150–400)
RBC: 4.13 MIL/uL — ABNORMAL LOW (ref 4.22–5.81)
RDW: 15.6 % — ABNORMAL HIGH (ref 11.5–15.5)
WBC Count: 7.8 10*3/uL (ref 4.0–10.5)
nRBC: 0 % (ref 0.0–0.2)

## 2021-02-11 MED ORDER — ROMIPLOSTIM 125 MCG ~~LOC~~ SOLR
1.0000 ug/kg | Freq: Once | SUBCUTANEOUS | Status: AC
  Administered 2021-02-11: 75 ug via SUBCUTANEOUS
  Filled 2021-02-11: qty 0.15

## 2021-02-11 NOTE — Patient Instructions (Signed)
Romiplostim injection ?What is this medication? ?ROMIPLOSTIM (roe mi PLOE stim) helps your body make more platelets. This medicine is used to treat low platelets caused by chronic idiopathic thrombocytopenic purpura (ITP) or a bone marrow syndrome caused by radiation sickness. ?This medicine may be used for other purposes; ask your health care provider or pharmacist if you have questions. ?COMMON BRAND NAME(S): Nplate ?What should I tell my care team before I take this medication? ?They need to know if you have any of these conditions: ?blood clots ?myelodysplastic syndrome ?an unusual or allergic reaction to romiplostim, mannitol, other medicines, foods, dyes, or preservatives ?pregnant or trying to get pregnant ?breast-feeding ?How should I use this medication? ?This medicine is injected under the skin. It is given by a health care provider in a hospital or clinic setting. ?A special MedGuide will be given to you before each treatment. Be sure to read this information carefully each time. ?Talk to your health care provider about the use of this medicine in children. While it may be prescribed for children as young as newborns for selected conditions, precautions do apply. ?Overdosage: If you think you have taken too much of this medicine contact a poison control center or emergency room at once. ?NOTE: This medicine is only for you. Do not share this medicine with others. ?What if I miss a dose? ?Keep appointments for follow-up doses. It is important not to miss your dose. Call your health care provider if you are unable to keep an appointment. ?What may interact with this medication? ?Interactions are not expected. ?This list may not describe all possible interactions. Give your health care provider a list of all the medicines, herbs, non-prescription drugs, or dietary supplements you use. Also tell them if you smoke, drink alcohol, or use illegal drugs. Some items may interact with your medicine. ?What should I  watch for while using this medication? ?Visit your health care provider for regular checks on your progress. You may need blood work done while you are taking this medicine. Your condition will be monitored carefully while you are receiving this medicine. It is important not to miss any appointments. ?What side effects may I notice from receiving this medication? ?Side effects that you should report to your doctor or health care professional as soon as possible: ?allergic reactions (skin rash, itching or hives; swelling of the face, lips, or tongue) ?bleeding (bloody or black, tarry stools; red or dark brown urine; spitting up blood or brown material that looks like coffee grounds; red spots on the skin; unusual bruising or bleeding from the eyes, gums, or nose) ?blood clot (chest pain; shortness of breath; pain, swelling, or warmth in the leg) ?stroke (changes in vision; confusion; trouble speaking or understanding; severe headaches; sudden numbness or weakness of the face, arm or leg; trouble walking; dizziness; loss of balance or coordination) ?Side effects that usually do not require medical attention (report to your doctor or health care professional if they continue or are bothersome): ?diarrhea ?dizziness ?headache ?joint pain ?muscle pain ?stomach pain ?trouble sleeping ?This list may not describe all possible side effects. Call your doctor for medical advice about side effects. You may report side effects to FDA at 1-800-FDA-1088. ?Where should I keep my medication? ?This medicine is given in a hospital or clinic. It will not be stored at home. ?NOTE: This sheet is a summary. It may not cover all possible information. If you have questions about this medicine, talk to your doctor, pharmacist, or health care provider. ??   2022 Elsevier/Gold Standard (2020-11-06 00:00:00) ? ?

## 2021-02-11 NOTE — Progress Notes (Signed)
[place] OFFICE PROGRESS NOTE   Diagnosis: ITP  INTERVAL HISTORY:   Douglas. Pfluger returns as scheduled.  He continues weekly Nplate.  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  He developed lacerations at the upper lip and right arm.  He was seen in the emergency room.  Objective:  Vital signs in last 24 hours:  Blood pressure 127/67, pulse 60, temperature 97.8 F (36.6 C), temperature source Oral, resp. rate 18, height '5\' 4"'  (1.626 m), weight 167 lb 3.2 oz (75.8 kg), SpO2 100 %.     Resp: Lungs clear bilaterally Cardio: Regular rate and rhythm GI: No hepatosplenomegaly Vascular: Trace edema of the left lower leg  Skin: Healing abrasion at the mid upper lip, abrasions at the right forearm with Steri-Strips in place.  The posterior abrasion has a small open area with mild surrounding erythema  Lab Results:  Lab Results  Component Value Date   WBC 7.8 02/11/2021   HGB 11.5 (L) 02/11/2021   HCT 36.4 (L) 02/11/2021   MCV 88.1 02/11/2021   PLT 72 (L) 02/11/2021   NEUTROABS 4.4 02/11/2021    CMP  Lab Results  Component Value Date   NA 141 08/20/2020   K 4.2 08/20/2020   CL 108 08/20/2020   CO2 25 08/20/2020   GLUCOSE 113 (H) 08/20/2020   BUN 27 (H) 08/20/2020   CREATININE 1.27 (H) 08/20/2020   CALCIUM 9.1 08/20/2020   PROT 6.5 08/20/2020   ALBUMIN 3.9 08/20/2020   AST 18 08/20/2020   ALT 14 08/20/2020   ALKPHOS 106 08/20/2020   BILITOT 0.4 08/20/2020   GFRNONAA 52 (L) 08/20/2020   GFRAA 59 (L) 10/31/2019    No results found for: CEA1, CEA, [id number], CA125  Lab Results  Component Value Date   INR 1.1 09/16/2019   LABPROT 13.8 09/16/2019    Imaging:  No results found.  Medications: I have reviewed the patient's current medications.   Assessment/Plan:  Thrombocytopenia Bone marrow biopsy 07/08/2016-no evidence of B-cell lymphoma, 40% cellular bone marrow with trilineage hematopoiesis,  megakaryocytes present with normal morphology, normal cytogenetics, negative for BRAF mutation Flow cytometry 01/05/2009-no monoclonal B-cell or phenotypically abnormal T-cell population Prednisone starting 06/29/2019 Nplate 07/04/2019, 8/46/9629, Nplate 07/18/2019 Prednisone 20 mg daily beginning 07/25/2019 Platelets 11,000 on 08/15/2019, prednisone increased to 40 mg daily, Nplate resume 07/29/4130  Prednisone decreased to 30 mg daily 08/25/2019, Nplate continued Prednisone continued at 30 mg daily 09/13/2019, weekly Nplate continued Prednisone taper to 20 mg daily 09/23/2019, weekly Nplate continued Prednisone taper to 10 mg daily 09/30/2019, weekly Nplate continued Prednisone taper to 5 mg daily 10/21/2019, weekly Nplate continued Prednisone taper to 5 mg every other day for 5 doses then stop 11/11/2019, weekly Nplate continued Nplate last given 4/40/1027 Promacta cost prohibitive Nplate beginning 04/07/3662, held 08/27/2020 and 09/03/2020 due to patient vacation Nplate resumed 06/03/4740 Nplate held 07/09/5636 secondary to an elevated platelet count Nplate resumed with dose reduction to 1 mcg/kg 10/09/2020 Nplate dose decreased to 0.5 mcg/kg 10/29/2020 Nplate dose increased to 1 mcg/kg 11/07/2020 Hairy cell leukemia 1982, status post splenectomy Coronary artery disease Aortic stenosis Liposarcoma at the right chest wall resected in 2013 Macular degeneration Hearing loss Basal cell carcinoma left cheek 05/24/2019 Left upper lobe nodule, faint FDG activity on PET at [facility] 05/31/2013 Status post TAVR procedure 09/06/2019 Admission with Pseudomonas urosepsis 09/16/2019 Acute left MCA distribution CVA 09/16/2019 Covid January 2022 Mild anemia, 1 of 3 stool Hemoccult cards  positive for occult blood 10/14/2020, ferritin low 06/29/2020    Disposition: [name] is stable from a hematologic standpoint.  He continues weekly Nplate.  Lacerations at the upper lip and right arm appear to be healing.  He will call for  erythema or drainage from the right forearm wound.  Douglas. Nawabi will return for an office visit in 1 month.  He continues weekly Nplate.  Betsy Coder, MD  02/11/2021  10:41 AM

## 2021-02-12 DIAGNOSIS — Z961 Presence of intraocular lens: Secondary | ICD-10-CM | POA: Diagnosis not present

## 2021-02-12 DIAGNOSIS — H524 Presbyopia: Secondary | ICD-10-CM | POA: Diagnosis not present

## 2021-02-12 DIAGNOSIS — H401431 Capsular glaucoma with pseudoexfoliation of lens, bilateral, mild stage: Secondary | ICD-10-CM | POA: Diagnosis not present

## 2021-02-13 DIAGNOSIS — H353221 Exudative age-related macular degeneration, left eye, with active choroidal neovascularization: Secondary | ICD-10-CM | POA: Diagnosis not present

## 2021-02-14 DIAGNOSIS — H4312 Vitreous hemorrhage, left eye: Secondary | ICD-10-CM | POA: Diagnosis not present

## 2021-02-14 DIAGNOSIS — H353221 Exudative age-related macular degeneration, left eye, with active choroidal neovascularization: Secondary | ICD-10-CM | POA: Diagnosis not present

## 2021-02-18 ENCOUNTER — Inpatient Hospital Stay: Payer: Medicare Other

## 2021-02-18 ENCOUNTER — Other Ambulatory Visit: Payer: Self-pay

## 2021-02-18 VITALS — BP 154/74 | HR 47 | Temp 97.1°F | Resp 20

## 2021-02-18 DIAGNOSIS — D693 Immune thrombocytopenic purpura: Secondary | ICD-10-CM

## 2021-02-18 LAB — CBC WITH DIFFERENTIAL (CANCER CENTER ONLY)
Abs Immature Granulocytes: 0.19 10*3/uL — ABNORMAL HIGH (ref 0.00–0.07)
Basophils Absolute: 0.1 10*3/uL (ref 0.0–0.1)
Basophils Relative: 1 %
Eosinophils Absolute: 0.2 10*3/uL (ref 0.0–0.5)
Eosinophils Relative: 2 %
HCT: 35.9 % — ABNORMAL LOW (ref 39.0–52.0)
Hemoglobin: 11.3 g/dL — ABNORMAL LOW (ref 13.0–17.0)
Immature Granulocytes: 2 %
Lymphocytes Relative: 14 %
Lymphs Abs: 1.2 10*3/uL (ref 0.7–4.0)
MCH: 27.7 pg (ref 26.0–34.0)
MCHC: 31.5 g/dL (ref 30.0–36.0)
MCV: 88 fL (ref 80.0–100.0)
Monocytes Absolute: 1.9 10*3/uL — ABNORMAL HIGH (ref 0.1–1.0)
Monocytes Relative: 23 %
Neutro Abs: 4.7 10*3/uL (ref 1.7–7.7)
Neutrophils Relative %: 58 %
Platelet Count: 94 10*3/uL — ABNORMAL LOW (ref 150–400)
RBC: 4.08 MIL/uL — ABNORMAL LOW (ref 4.22–5.81)
RDW: 15.6 % — ABNORMAL HIGH (ref 11.5–15.5)
WBC Count: 8.2 10*3/uL (ref 4.0–10.5)
nRBC: 0 % (ref 0.0–0.2)

## 2021-02-18 MED ORDER — ROMIPLOSTIM 125 MCG ~~LOC~~ SOLR
1.0000 ug/kg | Freq: Once | SUBCUTANEOUS | Status: AC
  Administered 2021-02-18: 11:00:00 75 ug via SUBCUTANEOUS
  Filled 2021-02-18: qty 0.15

## 2021-02-18 NOTE — Patient Instructions (Signed)
Romiplostim injection ?What is this medication? ?ROMIPLOSTIM (roe mi PLOE stim) helps your body make more platelets. This medicine is used to treat low platelets caused by chronic idiopathic thrombocytopenic purpura (ITP) or a bone marrow syndrome caused by radiation sickness. ?This medicine may be used for other purposes; ask your health care provider or pharmacist if you have questions. ?COMMON BRAND NAME(S): Nplate ?What should I tell my care team before I take this medication? ?They need to know if you have any of these conditions: ?blood clots ?myelodysplastic syndrome ?an unusual or allergic reaction to romiplostim, mannitol, other medicines, foods, dyes, or preservatives ?pregnant or trying to get pregnant ?breast-feeding ?How should I use this medication? ?This medicine is injected under the skin. It is given by a health care provider in a hospital or clinic setting. ?A special MedGuide will be given to you before each treatment. Be sure to read this information carefully each time. ?Talk to your health care provider about the use of this medicine in children. While it may be prescribed for children as young as newborns for selected conditions, precautions do apply. ?Overdosage: If you think you have taken too much of this medicine contact a poison control center or emergency room at once. ?NOTE: This medicine is only for you. Do not share this medicine with others. ?What if I miss a dose? ?Keep appointments for follow-up doses. It is important not to miss your dose. Call your health care provider if you are unable to keep an appointment. ?What may interact with this medication? ?Interactions are not expected. ?This list may not describe all possible interactions. Give your health care provider a list of all the medicines, herbs, non-prescription drugs, or dietary supplements you use. Also tell them if you smoke, drink alcohol, or use illegal drugs. Some items may interact with your medicine. ?What should I  watch for while using this medication? ?Visit your health care provider for regular checks on your progress. You may need blood work done while you are taking this medicine. Your condition will be monitored carefully while you are receiving this medicine. It is important not to miss any appointments. ?What side effects may I notice from receiving this medication? ?Side effects that you should report to your doctor or health care professional as soon as possible: ?allergic reactions (skin rash, itching or hives; swelling of the face, lips, or tongue) ?bleeding (bloody or black, tarry stools; red or dark brown urine; spitting up blood or brown material that looks like coffee grounds; red spots on the skin; unusual bruising or bleeding from the eyes, gums, or nose) ?blood clot (chest pain; shortness of breath; pain, swelling, or warmth in the leg) ?stroke (changes in vision; confusion; trouble speaking or understanding; severe headaches; sudden numbness or weakness of the face, arm or leg; trouble walking; dizziness; loss of balance or coordination) ?Side effects that usually do not require medical attention (report to your doctor or health care professional if they continue or are bothersome): ?diarrhea ?dizziness ?headache ?joint pain ?muscle pain ?stomach pain ?trouble sleeping ?This list may not describe all possible side effects. Call your doctor for medical advice about side effects. You may report side effects to FDA at 1-800-FDA-1088. ?Where should I keep my medication? ?This medicine is given in a hospital or clinic. It will not be stored at home. ?NOTE: This sheet is a summary. It may not cover all possible information. If you have questions about this medicine, talk to your doctor, pharmacist, or health care provider. ??   2022 Elsevier/Gold Standard (2020-11-06 00:00:00) ? ?

## 2021-02-26 ENCOUNTER — Other Ambulatory Visit: Payer: Self-pay

## 2021-02-26 ENCOUNTER — Inpatient Hospital Stay: Payer: Medicare Other

## 2021-02-26 VITALS — BP 137/64 | HR 56 | Temp 98.2°F | Resp 20 | Wt 166.6 lb

## 2021-02-26 DIAGNOSIS — D693 Immune thrombocytopenic purpura: Secondary | ICD-10-CM

## 2021-02-26 LAB — CBC WITH DIFFERENTIAL (CANCER CENTER ONLY)
Abs Immature Granulocytes: 0.14 10*3/uL — ABNORMAL HIGH (ref 0.00–0.07)
Basophils Absolute: 0.1 10*3/uL (ref 0.0–0.1)
Basophils Relative: 1 %
Eosinophils Absolute: 0.2 10*3/uL (ref 0.0–0.5)
Eosinophils Relative: 2 %
HCT: 36.9 % — ABNORMAL LOW (ref 39.0–52.0)
Hemoglobin: 11.5 g/dL — ABNORMAL LOW (ref 13.0–17.0)
Immature Granulocytes: 2 %
Lymphocytes Relative: 15 %
Lymphs Abs: 1.3 10*3/uL (ref 0.7–4.0)
MCH: 27.3 pg (ref 26.0–34.0)
MCHC: 31.2 g/dL (ref 30.0–36.0)
MCV: 87.4 fL (ref 80.0–100.0)
Monocytes Absolute: 1.5 10*3/uL — ABNORMAL HIGH (ref 0.1–1.0)
Monocytes Relative: 17 %
Neutro Abs: 5.5 10*3/uL (ref 1.7–7.7)
Neutrophils Relative %: 63 %
Platelet Count: 137 10*3/uL — ABNORMAL LOW (ref 150–400)
RBC: 4.22 MIL/uL (ref 4.22–5.81)
RDW: 15.4 % (ref 11.5–15.5)
WBC Count: 8.8 10*3/uL (ref 4.0–10.5)
nRBC: 0 % (ref 0.0–0.2)

## 2021-02-26 MED ORDER — ROMIPLOSTIM 125 MCG ~~LOC~~ SOLR
1.0000 ug/kg | Freq: Once | SUBCUTANEOUS | Status: AC
  Administered 2021-02-26: 11:00:00 75 ug via SUBCUTANEOUS
  Filled 2021-02-26: qty 0.15

## 2021-02-26 NOTE — Patient Instructions (Signed)
Romiplostim injection ?What is this medication? ?ROMIPLOSTIM (roe mi PLOE stim) helps your body make more platelets. This medicine is used to treat low platelets caused by chronic idiopathic thrombocytopenic purpura (ITP) or a bone marrow syndrome caused by radiation sickness. ?This medicine may be used for other purposes; ask your health care provider or pharmacist if you have questions. ?COMMON BRAND NAME(S): Nplate ?What should I tell my care team before I take this medication? ?They need to know if you have any of these conditions: ?blood clots ?myelodysplastic syndrome ?an unusual or allergic reaction to romiplostim, mannitol, other medicines, foods, dyes, or preservatives ?pregnant or trying to get pregnant ?breast-feeding ?How should I use this medication? ?This medicine is injected under the skin. It is given by a health care provider in a hospital or clinic setting. ?A special MedGuide will be given to you before each treatment. Be sure to read this information carefully each time. ?Talk to your health care provider about the use of this medicine in children. While it may be prescribed for children as young as newborns for selected conditions, precautions do apply. ?Overdosage: If you think you have taken too much of this medicine contact a poison control center or emergency room at once. ?NOTE: This medicine is only for you. Do not share this medicine with others. ?What if I miss a dose? ?Keep appointments for follow-up doses. It is important not to miss your dose. Call your health care provider if you are unable to keep an appointment. ?What may interact with this medication? ?Interactions are not expected. ?This list may not describe all possible interactions. Give your health care provider a list of all the medicines, herbs, non-prescription drugs, or dietary supplements you use. Also tell them if you smoke, drink alcohol, or use illegal drugs. Some items may interact with your medicine. ?What should I  watch for while using this medication? ?Visit your health care provider for regular checks on your progress. You may need blood work done while you are taking this medicine. Your condition will be monitored carefully while you are receiving this medicine. It is important not to miss any appointments. ?What side effects may I notice from receiving this medication? ?Side effects that you should report to your doctor or health care professional as soon as possible: ?allergic reactions (skin rash, itching or hives; swelling of the face, lips, or tongue) ?bleeding (bloody or black, tarry stools; red or dark brown urine; spitting up blood or brown material that looks like coffee grounds; red spots on the skin; unusual bruising or bleeding from the eyes, gums, or nose) ?blood clot (chest pain; shortness of breath; pain, swelling, or warmth in the leg) ?stroke (changes in vision; confusion; trouble speaking or understanding; severe headaches; sudden numbness or weakness of the face, arm or leg; trouble walking; dizziness; loss of balance or coordination) ?Side effects that usually do not require medical attention (report to your doctor or health care professional if they continue or are bothersome): ?diarrhea ?dizziness ?headache ?joint pain ?muscle pain ?stomach pain ?trouble sleeping ?This list may not describe all possible side effects. Call your doctor for medical advice about side effects. You may report side effects to FDA at 1-800-FDA-1088. ?Where should I keep my medication? ?This medicine is given in a hospital or clinic. It will not be stored at home. ?NOTE: This sheet is a summary. It may not cover all possible information. If you have questions about this medicine, talk to your doctor, pharmacist, or health care provider. ??   2022 Elsevier/Gold Standard (2020-11-06 00:00:00) ? ?

## 2021-03-05 ENCOUNTER — Inpatient Hospital Stay: Payer: Medicare Other

## 2021-03-05 ENCOUNTER — Other Ambulatory Visit: Payer: Self-pay

## 2021-03-05 ENCOUNTER — Inpatient Hospital Stay: Payer: Medicare Other | Attending: Nurse Practitioner

## 2021-03-05 VITALS — BP 131/58 | HR 54 | Temp 98.2°F | Resp 20 | Ht 64.0 in | Wt 166.5 lb

## 2021-03-05 DIAGNOSIS — D693 Immune thrombocytopenic purpura: Secondary | ICD-10-CM

## 2021-03-05 LAB — CBC WITH DIFFERENTIAL (CANCER CENTER ONLY)
Abs Immature Granulocytes: 0.12 10*3/uL — ABNORMAL HIGH (ref 0.00–0.07)
Basophils Absolute: 0.1 10*3/uL (ref 0.0–0.1)
Basophils Relative: 2 %
Eosinophils Absolute: 0.2 10*3/uL (ref 0.0–0.5)
Eosinophils Relative: 2 %
HCT: 36.6 % — ABNORMAL LOW (ref 39.0–52.0)
Hemoglobin: 11.6 g/dL — ABNORMAL LOW (ref 13.0–17.0)
Immature Granulocytes: 2 %
Lymphocytes Relative: 17 %
Lymphs Abs: 1.4 10*3/uL (ref 0.7–4.0)
MCH: 27.6 pg (ref 26.0–34.0)
MCHC: 31.7 g/dL (ref 30.0–36.0)
MCV: 86.9 fL (ref 80.0–100.0)
Monocytes Absolute: 1.6 10*3/uL — ABNORMAL HIGH (ref 0.1–1.0)
Monocytes Relative: 20 %
Neutro Abs: 4.4 10*3/uL (ref 1.7–7.7)
Neutrophils Relative %: 57 %
Platelet Count: 112 10*3/uL — ABNORMAL LOW (ref 150–400)
RBC: 4.21 MIL/uL — ABNORMAL LOW (ref 4.22–5.81)
RDW: 15.4 % (ref 11.5–15.5)
WBC Count: 7.8 10*3/uL (ref 4.0–10.5)
nRBC: 0 % (ref 0.0–0.2)

## 2021-03-05 MED ORDER — ROMIPLOSTIM 125 MCG ~~LOC~~ SOLR
1.0000 ug/kg | Freq: Once | SUBCUTANEOUS | Status: AC
  Administered 2021-03-05: 75 ug via SUBCUTANEOUS
  Filled 2021-03-05: qty 0.15

## 2021-03-05 NOTE — Patient Instructions (Signed)
Romiplostim injection ?What is this medication? ?ROMIPLOSTIM (roe mi PLOE stim) helps your body make more platelets. This medicine is used to treat low platelets caused by chronic idiopathic thrombocytopenic purpura (ITP) or a bone marrow syndrome caused by radiation sickness. ?This medicine may be used for other purposes; ask your health care provider or pharmacist if you have questions. ?COMMON BRAND NAME(S): Nplate ?What should I tell my care team before I take this medication? ?They need to know if you have any of these conditions: ?blood clots ?myelodysplastic syndrome ?an unusual or allergic reaction to romiplostim, mannitol, other medicines, foods, dyes, or preservatives ?pregnant or trying to get pregnant ?breast-feeding ?How should I use this medication? ?This medicine is injected under the skin. It is given by a health care provider in a hospital or clinic setting. ?A special MedGuide will be given to you before each treatment. Be sure to read this information carefully each time. ?Talk to your health care provider about the use of this medicine in children. While it may be prescribed for children as young as newborns for selected conditions, precautions do apply. ?Overdosage: If you think you have taken too much of this medicine contact a poison control center or emergency room at once. ?NOTE: This medicine is only for you. Do not share this medicine with others. ?What if I miss a dose? ?Keep appointments for follow-up doses. It is important not to miss your dose. Call your health care provider if you are unable to keep an appointment. ?What may interact with this medication? ?Interactions are not expected. ?This list may not describe all possible interactions. Give your health care provider a list of all the medicines, herbs, non-prescription drugs, or dietary supplements you use. Also tell them if you smoke, drink alcohol, or use illegal drugs. Some items may interact with your medicine. ?What should I  watch for while using this medication? ?Visit your health care provider for regular checks on your progress. You may need blood work done while you are taking this medicine. Your condition will be monitored carefully while you are receiving this medicine. It is important not to miss any appointments. ?What side effects may I notice from receiving this medication? ?Side effects that you should report to your doctor or health care professional as soon as possible: ?allergic reactions (skin rash, itching or hives; swelling of the face, lips, or tongue) ?bleeding (bloody or black, tarry stools; red or dark brown urine; spitting up blood or brown material that looks like coffee grounds; red spots on the skin; unusual bruising or bleeding from the eyes, gums, or nose) ?blood clot (chest pain; shortness of breath; pain, swelling, or warmth in the leg) ?stroke (changes in vision; confusion; trouble speaking or understanding; severe headaches; sudden numbness or weakness of the face, arm or leg; trouble walking; dizziness; loss of balance or coordination) ?Side effects that usually do not require medical attention (report to your doctor or health care professional if they continue or are bothersome): ?diarrhea ?dizziness ?headache ?joint pain ?muscle pain ?stomach pain ?trouble sleeping ?This list may not describe all possible side effects. Call your doctor for medical advice about side effects. You may report side effects to FDA at 1-800-FDA-1088. ?Where should I keep my medication? ?This medicine is given in a hospital or clinic. It will not be stored at home. ?NOTE: This sheet is a summary. It may not cover all possible information. If you have questions about this medicine, talk to your doctor, pharmacist, or health care provider. ??   2022 Elsevier/Gold Standard (2020-11-06 00:00:00) ? ?

## 2021-03-11 ENCOUNTER — Inpatient Hospital Stay: Payer: Medicare Other

## 2021-03-11 ENCOUNTER — Inpatient Hospital Stay (HOSPITAL_BASED_OUTPATIENT_CLINIC_OR_DEPARTMENT_OTHER): Payer: Medicare Other | Admitting: Nurse Practitioner

## 2021-03-11 ENCOUNTER — Other Ambulatory Visit: Payer: Self-pay

## 2021-03-11 ENCOUNTER — Encounter: Payer: Self-pay | Admitting: Nurse Practitioner

## 2021-03-11 VITALS — BP 142/67 | HR 61 | Temp 98.7°F | Resp 20 | Ht 64.0 in | Wt 164.0 lb

## 2021-03-11 DIAGNOSIS — D693 Immune thrombocytopenic purpura: Secondary | ICD-10-CM

## 2021-03-11 LAB — CBC WITH DIFFERENTIAL (CANCER CENTER ONLY)
Abs Immature Granulocytes: 0.13 10*3/uL — ABNORMAL HIGH (ref 0.00–0.07)
Basophils Absolute: 0.1 10*3/uL (ref 0.0–0.1)
Basophils Relative: 1 %
Eosinophils Absolute: 0.2 10*3/uL (ref 0.0–0.5)
Eosinophils Relative: 2 %
HCT: 36.7 % — ABNORMAL LOW (ref 39.0–52.0)
Hemoglobin: 11.5 g/dL — ABNORMAL LOW (ref 13.0–17.0)
Immature Granulocytes: 2 %
Lymphocytes Relative: 18 %
Lymphs Abs: 1.4 10*3/uL (ref 0.7–4.0)
MCH: 27.4 pg (ref 26.0–34.0)
MCHC: 31.3 g/dL (ref 30.0–36.0)
MCV: 87.4 fL (ref 80.0–100.0)
Monocytes Absolute: 1.7 10*3/uL — ABNORMAL HIGH (ref 0.1–1.0)
Monocytes Relative: 21 %
Neutro Abs: 4.5 10*3/uL (ref 1.7–7.7)
Neutrophils Relative %: 56 %
Platelet Count: 103 10*3/uL — ABNORMAL LOW (ref 150–400)
RBC: 4.2 MIL/uL — ABNORMAL LOW (ref 4.22–5.81)
RDW: 15.3 % (ref 11.5–15.5)
WBC Count: 7.9 10*3/uL (ref 4.0–10.5)
nRBC: 0 % (ref 0.0–0.2)

## 2021-03-11 MED ORDER — ROMIPLOSTIM 125 MCG ~~LOC~~ SOLR
1.0000 ug/kg | Freq: Once | SUBCUTANEOUS | Status: AC
  Administered 2021-03-11: 75 ug via SUBCUTANEOUS
  Filled 2021-03-11: qty 0.15

## 2021-03-11 NOTE — Patient Instructions (Signed)
Romiplostim injection ?What is this medication? ?ROMIPLOSTIM (roe mi PLOE stim) helps your body make more platelets. This medicine is used to treat low platelets caused by chronic idiopathic thrombocytopenic purpura (ITP) or a bone marrow syndrome caused by radiation sickness. ?This medicine may be used for other purposes; ask your health care provider or pharmacist if you have questions. ?COMMON BRAND NAME(S): Nplate ?What should I tell my care team before I take this medication? ?They need to know if you have any of these conditions: ?blood clots ?myelodysplastic syndrome ?an unusual or allergic reaction to romiplostim, mannitol, other medicines, foods, dyes, or preservatives ?pregnant or trying to get pregnant ?breast-feeding ?How should I use this medication? ?This medicine is injected under the skin. It is given by a health care provider in a hospital or clinic setting. ?A special MedGuide will be given to you before each treatment. Be sure to read this information carefully each time. ?Talk to your health care provider about the use of this medicine in children. While it may be prescribed for children as young as newborns for selected conditions, precautions do apply. ?Overdosage: If you think you have taken too much of this medicine contact a poison control center or emergency room at once. ?NOTE: This medicine is only for you. Do not share this medicine with others. ?What if I miss a dose? ?Keep appointments for follow-up doses. It is important not to miss your dose. Call your health care provider if you are unable to keep an appointment. ?What may interact with this medication? ?Interactions are not expected. ?This list may not describe all possible interactions. Give your health care provider a list of all the medicines, herbs, non-prescription drugs, or dietary supplements you use. Also tell them if you smoke, drink alcohol, or use illegal drugs. Some items may interact with your medicine. ?What should I  watch for while using this medication? ?Visit your health care provider for regular checks on your progress. You may need blood work done while you are taking this medicine. Your condition will be monitored carefully while you are receiving this medicine. It is important not to miss any appointments. ?What side effects may I notice from receiving this medication? ?Side effects that you should report to your doctor or health care professional as soon as possible: ?allergic reactions (skin rash, itching or hives; swelling of the face, lips, or tongue) ?bleeding (bloody or black, tarry stools; red or dark brown urine; spitting up blood or brown material that looks like coffee grounds; red spots on the skin; unusual bruising or bleeding from the eyes, gums, or nose) ?blood clot (chest pain; shortness of breath; pain, swelling, or warmth in the leg) ?stroke (changes in vision; confusion; trouble speaking or understanding; severe headaches; sudden numbness or weakness of the face, arm or leg; trouble walking; dizziness; loss of balance or coordination) ?Side effects that usually do not require medical attention (report to your doctor or health care professional if they continue or are bothersome): ?diarrhea ?dizziness ?headache ?joint pain ?muscle pain ?stomach pain ?trouble sleeping ?This list may not describe all possible side effects. Call your doctor for medical advice about side effects. You may report side effects to FDA at 1-800-FDA-1088. ?Where should I keep my medication? ?This medicine is given in a hospital or clinic. It will not be stored at home. ?NOTE: This sheet is a summary. It may not cover all possible information. If you have questions about this medicine, talk to your doctor, pharmacist, or health care provider. ??   2022 Elsevier/Gold Standard (2020-11-06 00:00:00) ? ?

## 2021-03-11 NOTE — Progress Notes (Signed)
Patient seen by Lisa Thomas NP today  Vitals are within treatment parameters.  Labs reviewed by Lisa Thomas NP and are within treatment parameters.  Per physician team, patient is ready for treatment and there are NO modifications to the treatment plan.     

## 2021-03-11 NOTE — Progress Notes (Signed)
[place] OFFICE PROGRESS NOTE   Diagnosis: ITP  INTERVAL HISTORY:   [name] returns as scheduled.  He continues weekly Nplate.  He denies bleeding.  No recent bruising.  Objective:  Vital signs in last 24 hours:  Blood pressure (!) 142/67, pulse 61, temperature 98.7 F (37.1 C), temperature source Oral, resp. rate 20, height 5' 4" (1.626 m), weight 164 lb (74.4 kg), SpO2 98 %.     Resp: Lungs clear bilaterally. Cardio: Regular rate and rhythm. GI: No hepatosplenomegaly. Vascular: Trace edema lower leg bilaterally. Skin: Mid upper lip abrasion has healed.  Abrasions at the right forearm have healed.   Lab Results:  Lab Results  Component Value Date   WBC 7.9 03/11/2021   HGB 11.5 (L) 03/11/2021   HCT 36.7 (L) 03/11/2021   MCV 87.4 03/11/2021   PLT 103 (L) 03/11/2021   NEUTROABS 4.5 03/11/2021    Imaging:  No results found.  Medications: I have reviewed the patient's current medications.  Assessment/Plan: Thrombocytopenia Bone marrow biopsy 07/08/2016-no evidence of B-cell lymphoma, 40% cellular bone marrow with trilineage hematopoiesis, megakaryocytes present with normal morphology, normal cytogenetics, negative for BRAF mutation Flow cytometry 01/05/2009-no monoclonal B-cell or phenotypically abnormal T-cell population Prednisone starting 06/29/2019 Nplate 07/04/2019, 3/97/6734, Nplate 07/18/2019 Prednisone 20 mg daily beginning 07/25/2019 Platelets 11,000 on 08/15/2019, prednisone increased to 40 mg daily, Nplate resume 1/93/7902  Prednisone decreased to 30 mg daily 08/25/2019, Nplate continued Prednisone continued at 30 mg daily 09/13/2019, weekly Nplate continued Prednisone taper to 20 mg daily 09/23/2019, weekly Nplate continued Prednisone taper to 10 mg daily 09/30/2019, weekly Nplate continued Prednisone taper to 5 mg daily 10/21/2019, weekly Nplate continued Prednisone taper to 5 mg every other day for 5 doses then stop 11/11/2019, weekly Nplate  continued Nplate last given 06/10/7351 Promacta cost prohibitive Nplate beginning 04/11/9240, held 08/27/2020 and 09/03/2020 due to patient vacation Nplate resumed 6/83/4196 Nplate held 04/04/2977 secondary to an elevated platelet count Nplate resumed with dose reduction to 1 mcg/kg 10/09/2020 Nplate dose decreased to 0.5 mcg/kg 10/29/2020 Nplate dose increased to 1 mcg/kg 11/07/2020 Hairy cell leukemia 1982, status post splenectomy Coronary artery disease Aortic stenosis Liposarcoma at the right chest wall resected in 2013 Macular degeneration Hearing loss Basal cell carcinoma left cheek 05/24/2019 Left upper lobe nodule, faint FDG activity on PET at [facility] 05/31/2013 Status post TAVR procedure 09/06/2019 Admission with Pseudomonas urosepsis 09/16/2019 Acute left MCA distribution CVA 09/16/2019 Covid January 2022 Mild anemia, 1 of 3 stool Hemoccult cards positive for occult blood 10/14/2020, ferritin low 06/29/2020    Disposition: Mr. Winch remains stable from a hematologic standpoint.  Plan to continue weekly Nplate.  He will return for an office visit in 1 month.    [name] ANP/GNP-BC   03/11/2021  10:14 AM

## 2021-03-12 ENCOUNTER — Encounter: Payer: Self-pay | Admitting: Interventional Cardiology

## 2021-03-12 ENCOUNTER — Other Ambulatory Visit: Payer: Self-pay | Admitting: Physician Assistant

## 2021-03-12 DIAGNOSIS — I10 Essential (primary) hypertension: Secondary | ICD-10-CM

## 2021-03-12 NOTE — Telephone Encounter (Signed)
Spoke with pt to clarify if he's taking Metoprolol Tartrate or Succinate and if he's only taking a half tablet once a day.  Pt headed home and will clarify what his bottle says and let us know.    Previous prescription were for Tartrate but we will await pt for clarification.

## 2021-03-12 NOTE — Telephone Encounter (Signed)
Hey this medication, metoprolol 25 mg tablet was D/C by you and pt was prescribed the metoprolol 12.5 mg tablets. Metoprolol does not come in 12.5 mg. The pharmacy is requesting metoprolol 25 mg tablets, pt taking 1/2. Can we reorder the 25 mg tablet? Please address

## 2021-03-13 MED ORDER — METOPROLOL TARTRATE 25 MG PO TABS: 12.5000 mg | ORAL_TABLET | Freq: Every day | ORAL | 3 refills | Status: DC

## 2021-03-13 NOTE — Telephone Encounter (Signed)
Cathy sent a message questioning the pt's refill request for metoprolol and stated it wasn't on the med list. I was able to find metoprolol listed. It looks like when pt was here in office 7/22 and saw Nell Range, that the plan was to D/C Metoprolol tartrate and start Norvasc. Pt sent MyChart messages in August 2022 stating they had not made this change and their BP appeared to be stable. Pt included pictures of RX bottle from pharmacy that reflect's Metoprolol TARTRATE.  Per Joellen Jersey, continue on Metoprolol  tartrate 12.5mg  QD. Per Dr Thompson Caul [facility] notes on 9/28, Pt was taking Metoprolol SUCCINATE (Toprol XL) 12.5mg  QD. The pt's medication list in Epic reflects this as well, NOT Tartrate. Pt is now requesting refill for Metoprolol (03/12/21). Pt has again clarified with Jeanann Lewandowsky, CMA here via MyChart that he is on the TARTRATE formula at 12.5 mg daily. Sending message to Brien Few, RN to confirm which formulation they want the patient to be taking and how many refills they would like to prescribe pt.

## 2021-03-13 NOTE — Telephone Encounter (Signed)
Refill for Metoprolol TARTRATE 25mg  (Take 1/2 tablet QD)  sent in to pharmacy for patient.    Barbaraann Cao, RN; Loren Racer, RN Okay to fill whatever he reports he is taking and then put it on his med list. [place] he is doing well!  KT   March 12, 2021 [name] to [place] [facility] (supporting Belva Crome, MD)     10:07 PM Anderson Malta, the refill request is for Metoprolol Tartrate 25mg . Take 1/2 tablet by mouth daily. Thank you for following up with this. Rx was ordered by Nell Range.  We use [facility] on [address].  Thanks again.  [name] for [name]  *Pt also sent picture of RX bottle through MyChart on 10/03/20 that verifies TARTRATE formula

## 2021-03-18 ENCOUNTER — Inpatient Hospital Stay: Payer: Medicare Other

## 2021-03-18 ENCOUNTER — Other Ambulatory Visit: Payer: Self-pay

## 2021-03-18 VITALS — BP 138/77 | HR 57 | Temp 97.8°F | Resp 20 | Wt 164.0 lb

## 2021-03-18 DIAGNOSIS — D693 Immune thrombocytopenic purpura: Secondary | ICD-10-CM

## 2021-03-18 LAB — CBC WITH DIFFERENTIAL (CANCER CENTER ONLY)
Abs Immature Granulocytes: 0.17 10*3/uL — ABNORMAL HIGH (ref 0.00–0.07)
Basophils Absolute: 0.1 10*3/uL (ref 0.0–0.1)
Basophils Relative: 2 %
Eosinophils Absolute: 0.2 10*3/uL (ref 0.0–0.5)
Eosinophils Relative: 2 %
HCT: 36.8 % — ABNORMAL LOW (ref 39.0–52.0)
Hemoglobin: 11.5 g/dL — ABNORMAL LOW (ref 13.0–17.0)
Immature Granulocytes: 2 %
Lymphocytes Relative: 17 %
Lymphs Abs: 1.4 10*3/uL (ref 0.7–4.0)
MCH: 27.1 pg (ref 26.0–34.0)
MCHC: 31.3 g/dL (ref 30.0–36.0)
MCV: 86.8 fL (ref 80.0–100.0)
Monocytes Absolute: 1.7 10*3/uL — ABNORMAL HIGH (ref 0.1–1.0)
Monocytes Relative: 21 %
Neutro Abs: 4.5 10*3/uL (ref 1.7–7.7)
Neutrophils Relative %: 56 %
Platelet Count: 127 10*3/uL — ABNORMAL LOW (ref 150–400)
RBC: 4.24 MIL/uL (ref 4.22–5.81)
RDW: 15.3 % (ref 11.5–15.5)
WBC Count: 8 10*3/uL (ref 4.0–10.5)
nRBC: 0 % (ref 0.0–0.2)

## 2021-03-18 MED ORDER — ROMIPLOSTIM 125 MCG ~~LOC~~ SOLR
1.0000 ug/kg | Freq: Once | SUBCUTANEOUS | Status: AC
  Administered 2021-03-18: 75 ug via SUBCUTANEOUS
  Filled 2021-03-18: qty 0.15

## 2021-03-18 NOTE — Patient Instructions (Signed)
Romiplostim injection ?What is this medication? ?ROMIPLOSTIM (roe mi PLOE stim) helps your body make more platelets. This medicine is used to treat low platelets caused by chronic idiopathic thrombocytopenic purpura (ITP) or a bone marrow syndrome caused by radiation sickness. ?This medicine may be used for other purposes; ask your health care provider or pharmacist if you have questions. ?COMMON BRAND NAME(S): Nplate ?What should I tell my care team before I take this medication? ?They need to know if you have any of these conditions: ?blood clots ?myelodysplastic syndrome ?an unusual or allergic reaction to romiplostim, mannitol, other medicines, foods, dyes, or preservatives ?pregnant or trying to get pregnant ?breast-feeding ?How should I use this medication? ?This medicine is injected under the skin. It is given by a health care provider in a hospital or clinic setting. ?A special MedGuide will be given to you before each treatment. Be sure to read this information carefully each time. ?Talk to your health care provider about the use of this medicine in children. While it may be prescribed for children as young as newborns for selected conditions, precautions do apply. ?Overdosage: If you think you have taken too much of this medicine contact a poison control center or emergency room at once. ?NOTE: This medicine is only for you. Do not share this medicine with others. ?What if I miss a dose? ?Keep appointments for follow-up doses. It is important not to miss your dose. Call your health care provider if you are unable to keep an appointment. ?What may interact with this medication? ?Interactions are not expected. ?This list may not describe all possible interactions. Give your health care provider a list of all the medicines, herbs, non-prescription drugs, or dietary supplements you use. Also tell them if you smoke, drink alcohol, or use illegal drugs. Some items may interact with your medicine. ?What should I  watch for while using this medication? ?Visit your health care provider for regular checks on your progress. You may need blood work done while you are taking this medicine. Your condition will be monitored carefully while you are receiving this medicine. It is important not to miss any appointments. ?What side effects may I notice from receiving this medication? ?Side effects that you should report to your doctor or health care professional as soon as possible: ?allergic reactions (skin rash, itching or hives; swelling of the face, lips, or tongue) ?bleeding (bloody or black, tarry stools; red or dark brown urine; spitting up blood or brown material that looks like coffee grounds; red spots on the skin; unusual bruising or bleeding from the eyes, gums, or nose) ?blood clot (chest pain; shortness of breath; pain, swelling, or warmth in the leg) ?stroke (changes in vision; confusion; trouble speaking or understanding; severe headaches; sudden numbness or weakness of the face, arm or leg; trouble walking; dizziness; loss of balance or coordination) ?Side effects that usually do not require medical attention (report to your doctor or health care professional if they continue or are bothersome): ?diarrhea ?dizziness ?headache ?joint pain ?muscle pain ?stomach pain ?trouble sleeping ?This list may not describe all possible side effects. Call your doctor for medical advice about side effects. You may report side effects to FDA at 1-800-FDA-1088. ?Where should I keep my medication? ?This medicine is given in a hospital or clinic. It will not be stored at home. ?NOTE: This sheet is a summary. It may not cover all possible information. If you have questions about this medicine, talk to your doctor, pharmacist, or health care provider. ??   2022 Elsevier/Gold Standard (2020-11-06 00:00:00) ? ?

## 2021-03-25 ENCOUNTER — Other Ambulatory Visit: Payer: Self-pay

## 2021-03-25 ENCOUNTER — Inpatient Hospital Stay: Payer: Medicare Other

## 2021-03-25 VITALS — BP 112/60 | HR 54 | Temp 99.1°F | Resp 20

## 2021-03-25 DIAGNOSIS — D693 Immune thrombocytopenic purpura: Secondary | ICD-10-CM

## 2021-03-25 LAB — CBC WITH DIFFERENTIAL (CANCER CENTER ONLY)
Abs Immature Granulocytes: 0.17 10*3/uL — ABNORMAL HIGH (ref 0.00–0.07)
Basophils Absolute: 0.1 10*3/uL (ref 0.0–0.1)
Basophils Relative: 1 %
Eosinophils Absolute: 0.2 10*3/uL (ref 0.0–0.5)
Eosinophils Relative: 2 %
HCT: 36.1 % — ABNORMAL LOW (ref 39.0–52.0)
Hemoglobin: 11.5 g/dL — ABNORMAL LOW (ref 13.0–17.0)
Immature Granulocytes: 2 %
Lymphocytes Relative: 13 %
Lymphs Abs: 1.2 10*3/uL (ref 0.7–4.0)
MCH: 27.4 pg (ref 26.0–34.0)
MCHC: 31.9 g/dL (ref 30.0–36.0)
MCV: 86.2 fL (ref 80.0–100.0)
Monocytes Absolute: 2 10*3/uL — ABNORMAL HIGH (ref 0.1–1.0)
Monocytes Relative: 21 %
Neutro Abs: 5.7 10*3/uL (ref 1.7–7.7)
Neutrophils Relative %: 61 %
Platelet Count: 151 10*3/uL (ref 150–400)
RBC: 4.19 MIL/uL — ABNORMAL LOW (ref 4.22–5.81)
RDW: 15.3 % (ref 11.5–15.5)
WBC Count: 9.4 10*3/uL (ref 4.0–10.5)
nRBC: 0 % (ref 0.0–0.2)

## 2021-03-25 MED ORDER — ROMIPLOSTIM 125 MCG ~~LOC~~ SOLR
1.0000 ug/kg | Freq: Once | SUBCUTANEOUS | Status: AC
  Administered 2021-03-25: 75 ug via SUBCUTANEOUS
  Filled 2021-03-25: qty 0.15

## 2021-03-25 NOTE — Patient Instructions (Signed)
Romiplostim injection ?What is this medication? ?ROMIPLOSTIM (roe mi PLOE stim) helps your body make more platelets. This medicine is used to treat low platelets caused by chronic idiopathic thrombocytopenic purpura (ITP) or a bone marrow syndrome caused by radiation sickness. ?This medicine may be used for other purposes; ask your health care provider or pharmacist if you have questions. ?COMMON BRAND NAME(S): Nplate ?What should I tell my care team before I take this medication? ?They need to know if you have any of these conditions: ?blood clots ?myelodysplastic syndrome ?an unusual or allergic reaction to romiplostim, mannitol, other medicines, foods, dyes, or preservatives ?pregnant or trying to get pregnant ?breast-feeding ?How should I use this medication? ?This medicine is injected under the skin. It is given by a health care provider in a hospital or clinic setting. ?A special MedGuide will be given to you before each treatment. Be sure to read this information carefully each time. ?Talk to your health care provider about the use of this medicine in children. While it may be prescribed for children as young as newborns for selected conditions, precautions do apply. ?Overdosage: If you think you have taken too much of this medicine contact a poison control center or emergency room at once. ?NOTE: This medicine is only for you. Do not share this medicine with others. ?What if I miss a dose? ?Keep appointments for follow-up doses. It is important not to miss your dose. Call your health care provider if you are unable to keep an appointment. ?What may interact with this medication? ?Interactions are not expected. ?This list may not describe all possible interactions. Give your health care provider a list of all the medicines, herbs, non-prescription drugs, or dietary supplements you use. Also tell them if you smoke, drink alcohol, or use illegal drugs. Some items may interact with your medicine. ?What should I  watch for while using this medication? ?Visit your health care provider for regular checks on your progress. You may need blood work done while you are taking this medicine. Your condition will be monitored carefully while you are receiving this medicine. It is important not to miss any appointments. ?What side effects may I notice from receiving this medication? ?Side effects that you should report to your doctor or health care professional as soon as possible: ?allergic reactions (skin rash, itching or hives; swelling of the face, lips, or tongue) ?bleeding (bloody or black, tarry stools; red or dark brown urine; spitting up blood or brown material that looks like coffee grounds; red spots on the skin; unusual bruising or bleeding from the eyes, gums, or nose) ?blood clot (chest pain; shortness of breath; pain, swelling, or warmth in the leg) ?stroke (changes in vision; confusion; trouble speaking or understanding; severe headaches; sudden numbness or weakness of the face, arm or leg; trouble walking; dizziness; loss of balance or coordination) ?Side effects that usually do not require medical attention (report to your doctor or health care professional if they continue or are bothersome): ?diarrhea ?dizziness ?headache ?joint pain ?muscle pain ?stomach pain ?trouble sleeping ?This list may not describe all possible side effects. Call your doctor for medical advice about side effects. You may report side effects to FDA at 1-800-FDA-1088. ?Where should I keep my medication? ?This medicine is given in a hospital or clinic. It will not be stored at home. ?NOTE: This sheet is a summary. It may not cover all possible information. If you have questions about this medicine, talk to your doctor, pharmacist, or health care provider. ??   2022 Elsevier/Gold Standard (2020-11-06 00:00:00) ? ?

## 2021-03-26 DIAGNOSIS — H353221 Exudative age-related macular degeneration, left eye, with active choroidal neovascularization: Secondary | ICD-10-CM | POA: Diagnosis not present

## 2021-03-30 IMAGING — DX DG CHEST 1V PORT
1 series · 1 of 1 positions shown · non-contrast
Comparison: Radiograph 09/06/2019, CT 07/29/2019

CLINICAL DATA: Postop fever.

EXAM:
PORTABLE CHEST 1 VIEW

[chest]
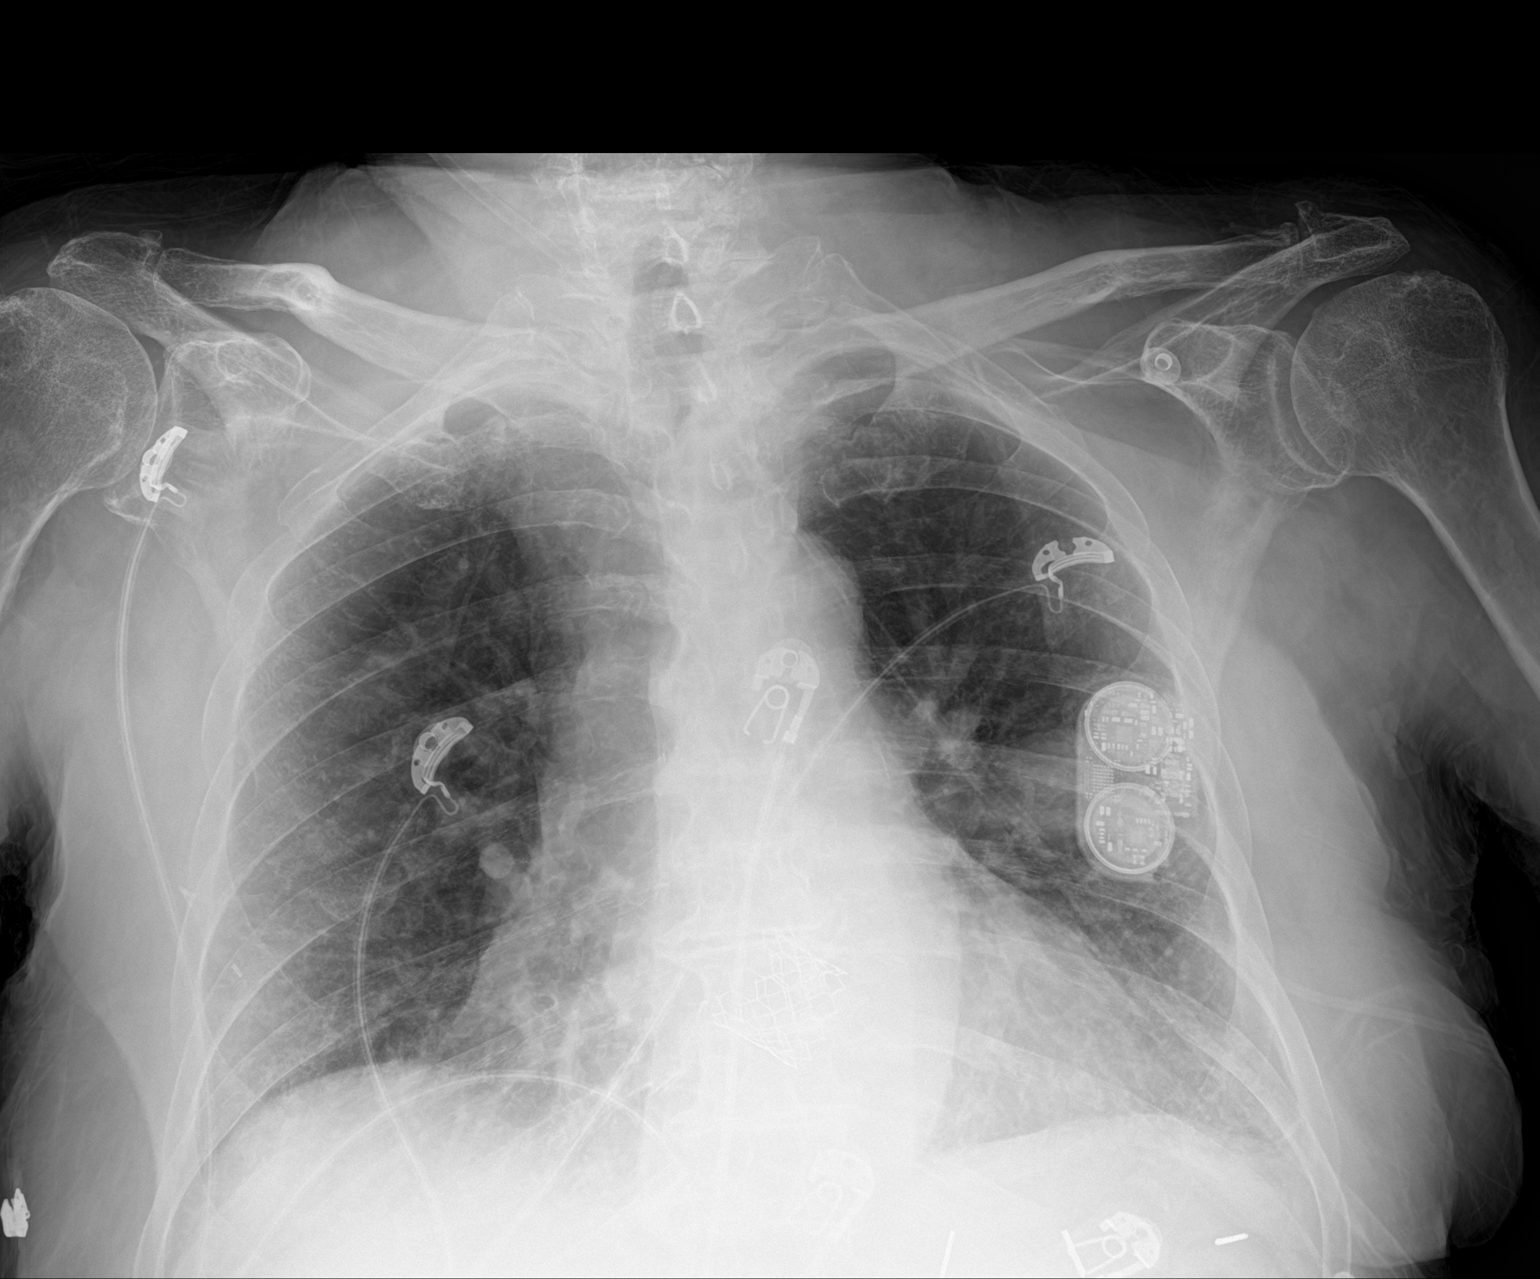

[1 of 1 positions shown; findings below may reference images not displayed]

FINDINGS: Post TAVR. Previous right internal jugular pacer is been removed.
Battery pack projects over the left chest wall. Stable cardiomegaly.
Aortic atherosclerosis. Central left lung mass is unchanged by
radiograph. No evidence of acute airspace disease. No pleural fluid.
No pneumothorax. No pulmonary edema.
IMPRESSION: 1. No acute abnormality.
2. Stable mild cardiomegaly and left lung mass.

## 2021-03-30 IMAGING — CT CT HEAD CODE STROKE
3 series · 14 of 47 positions shown, 16 images · non-contrast
Comparison: No pertinent prior studies available for comparison.

CLINICAL DATA: Code stroke. Possible stroke, neuro deficit,
subacute. Right-sided facial droop, aphasia, last known well [DATE]

EXAM:
CT HEAD WITHOUT CONTRAST
TECHNIQUE: Contiguous axial images were obtained from the base of the skull
through the vertex without intravenous contrast.

[Series 3: head 5.0 st · axial · 0.41mm/px · z∈[-79,+51]mm · 8 of 32 slices shown, 10 images]
[im 3/32  brain]
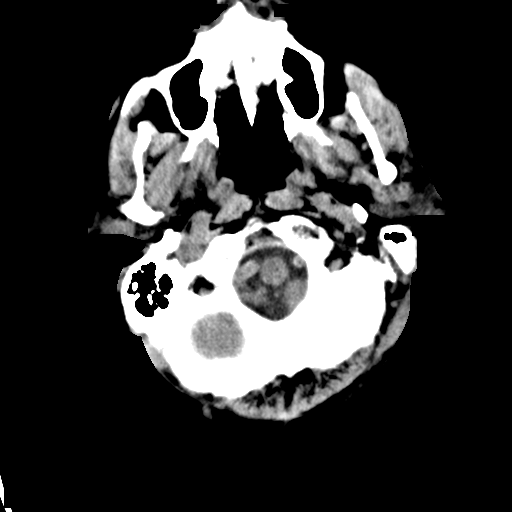
[im 3/32  bone]
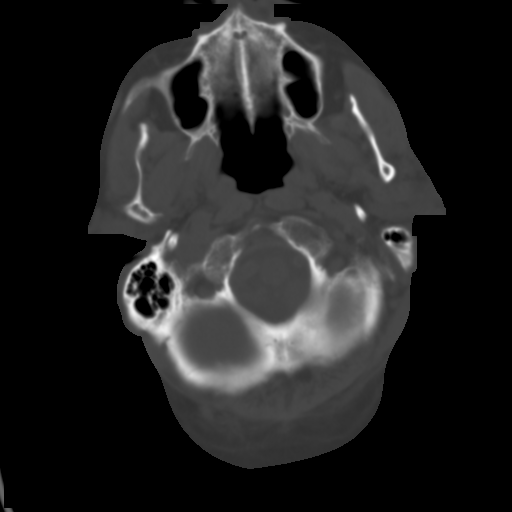
[im 7/32  brain]
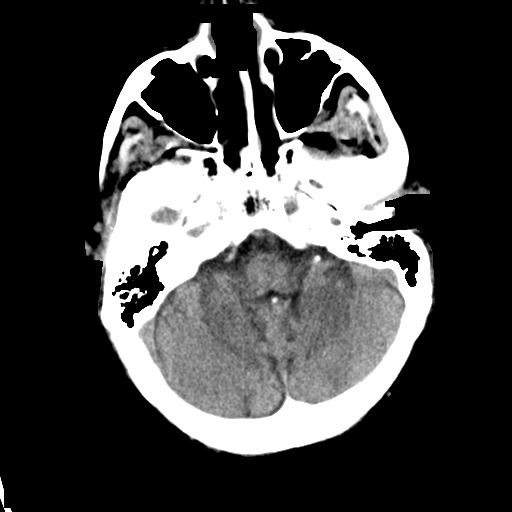
[im 10/32  brain]
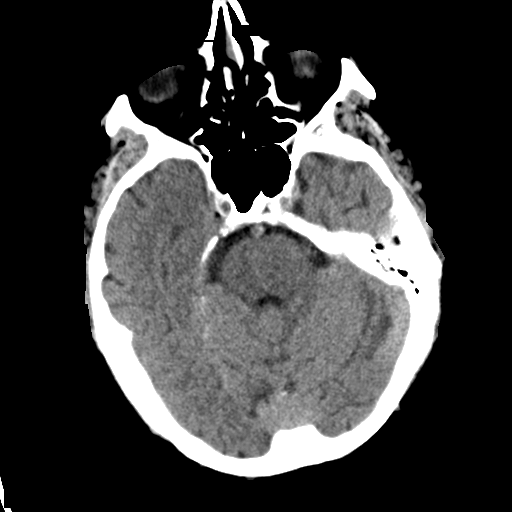
[im 14/32  brain]
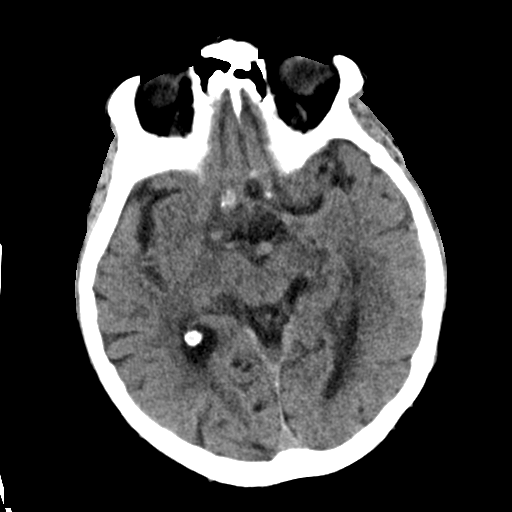
[im 18/32  brain]
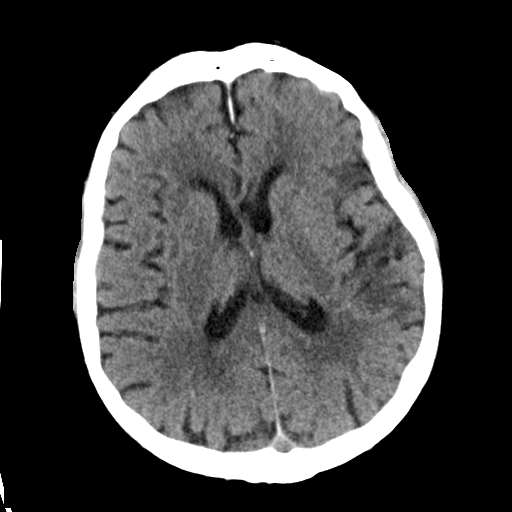
[im 18/32  bone]
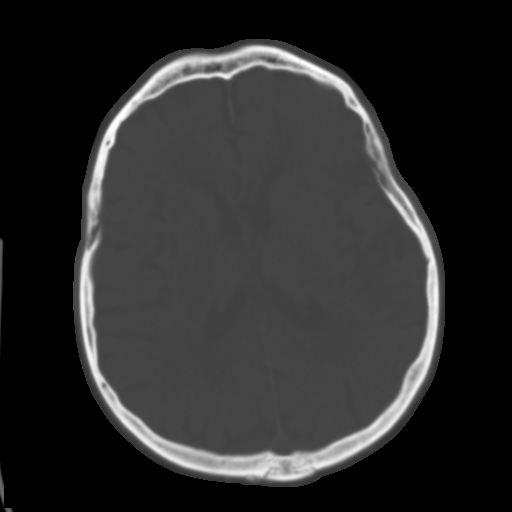
[im 22/32  brain]
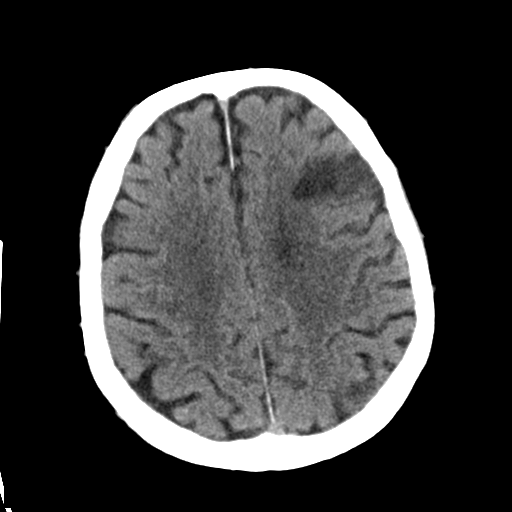
[im 25/32  brain]
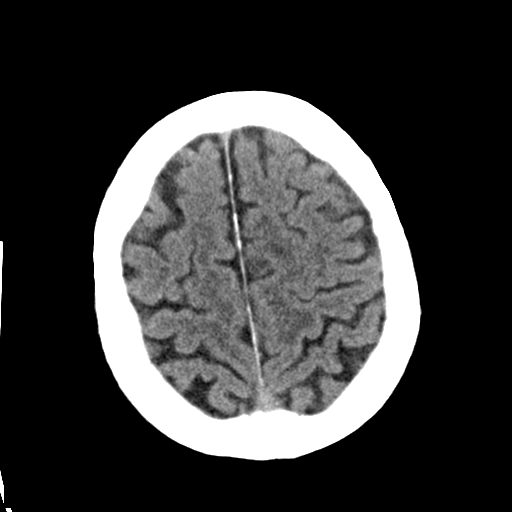
[im 29/32  brain]
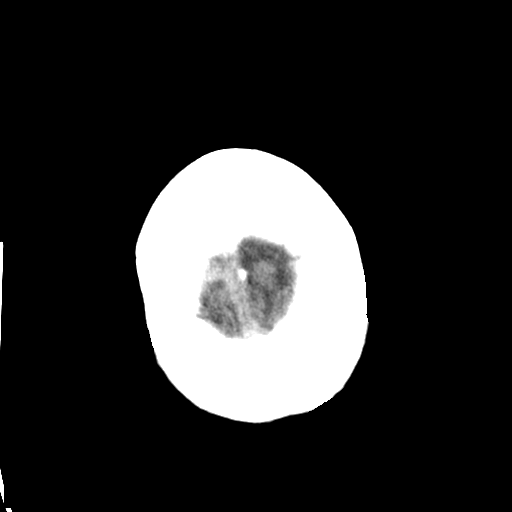

[Series 5: head 3.0 cor st · coronal · 0.34mm/px · 3 of 67 slices shown]
[im 23/67  brain]
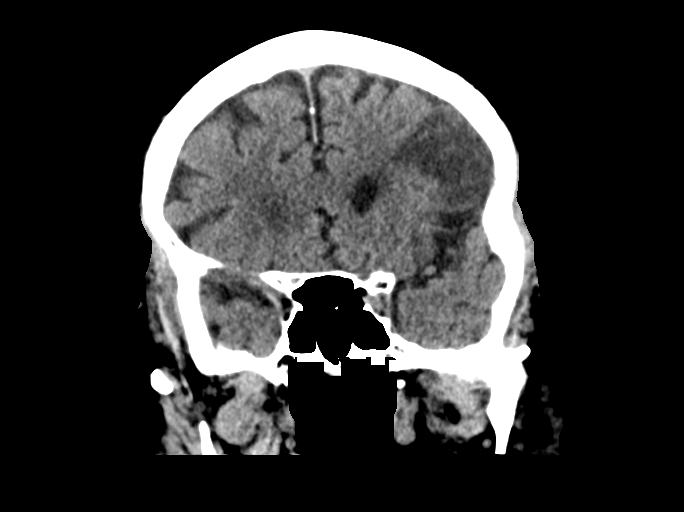
[im 30/67  brain]
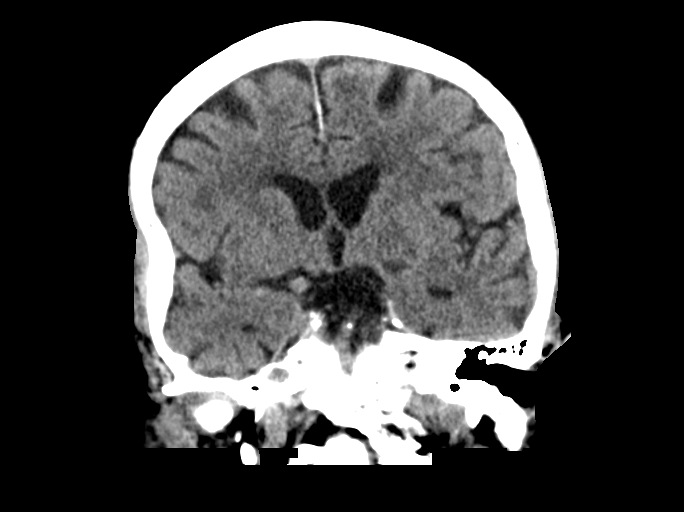
[im 37/67  brain]
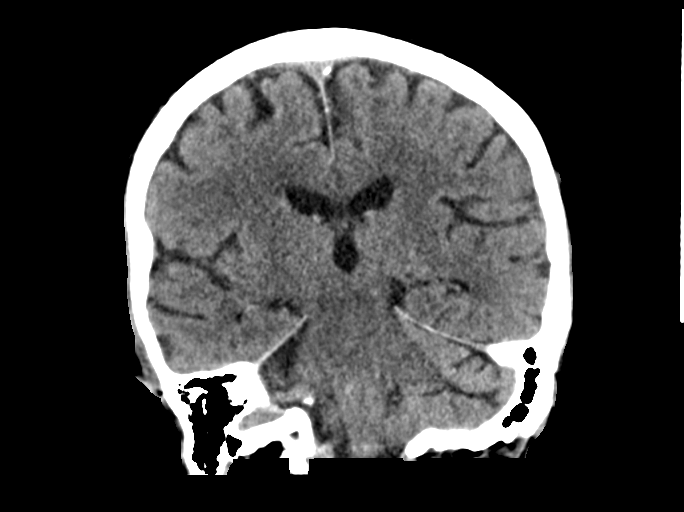

[Series 6: head 3.0 sag st · sagittal · 0.33mm/px · 3 of 58 slices shown]
[im 20/58  brain]
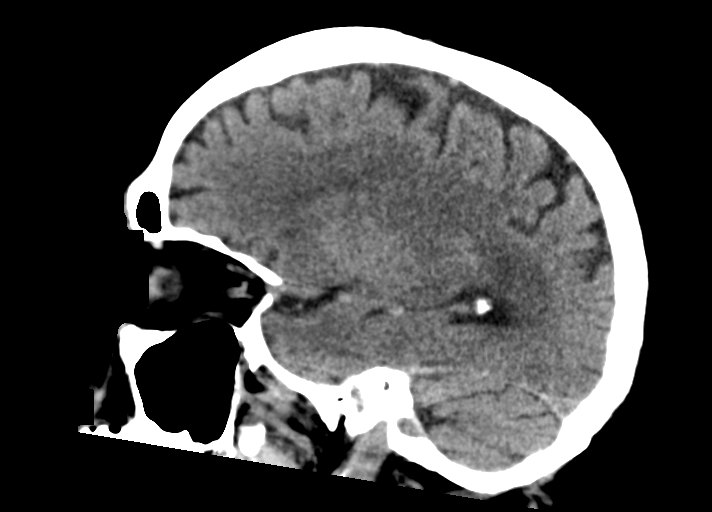
[im 29/58  brain]
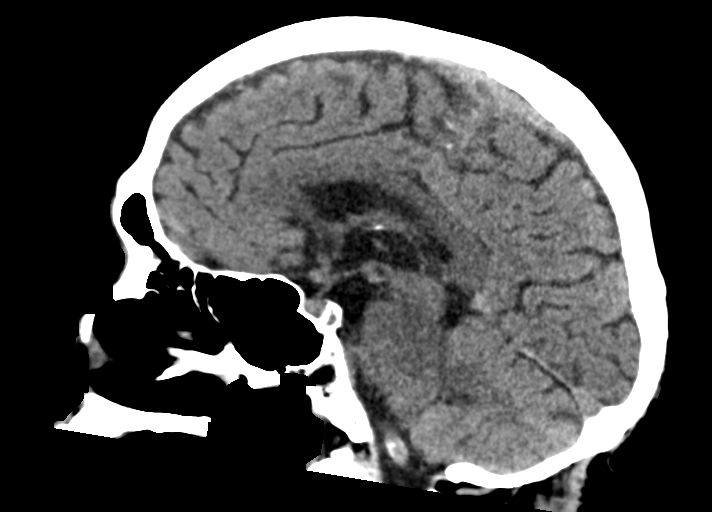
[im 39/58  brain]
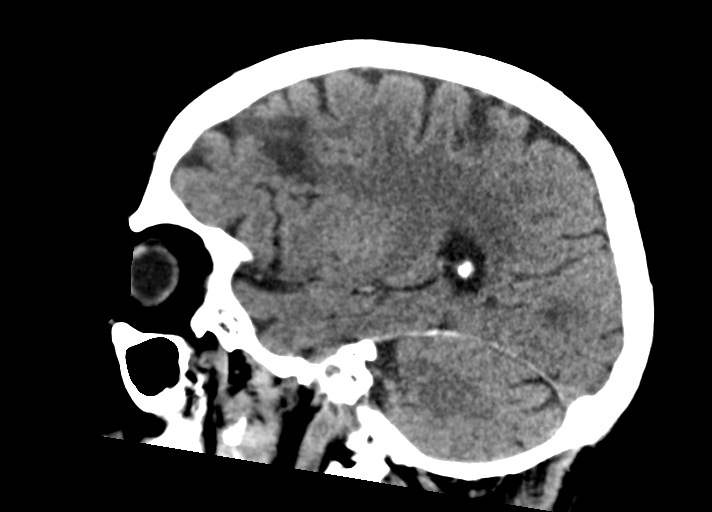

[14 of 47 positions shown; findings below may reference images not displayed]

FINDINGS: Brain:

Mild generalized parenchymal atrophy.

There is abnormal cortical/subcortical hyperdensity within the mid
left frontal lobe and left frontal operculum consistent with
acute/early subacute infarction. The infarct measures 2.4 x 3.7 x
3.5 cm. There is mild regional mass effect. No ventricular
effacement or midline shift. No evidence of hemorrhagic conversion.

Background mild chronic small vessel ischemic disease.

No extra-axial fluid collection.

No evidence of intracranial mass.

No midline shift.

Vascular: No definite hyperdense vessel. Atherosclerotic
calcifications.

Skull: Normal. Negative for fracture or focal lesion.

Sinuses/Orbits: Visualized orbits show no acute finding. Bilateral
lens replacements. Mild ethmoid and maxillary sinus mucosal
thickening. No significant mastoid effusion.

ASPECTS (Alberta Stroke Program Early CT Score)

- Ganglionic level infarction (caudate, lentiform nuclei, internal
capsule, insula, M1-M3 cortex): 7

- Supraganglionic infarction (M4-M6 cortex): 1

Total score (0-10 with 10 being normal): 8

These results were called by telephone at the time of interpretation
on 09/16/2019 at [DATE] to provider Dr. Sine, who verbally
acknowledged these results.
IMPRESSION: 1. Acute/early subacute ischemic infarction within the left MCA
vascular territory within the mid left frontal lobe and left frontal
operculum. ASPECTS is 8. Mild regional mass effect. No midline
shift. No hemorrhagic conversion.
2. Background mild generalized parenchymal atrophy and chronic small
vessel ischemic disease
3. Mild ethmoid sinus mucosal thickening.

## 2021-04-01 ENCOUNTER — Inpatient Hospital Stay: Payer: Medicare Other

## 2021-04-01 ENCOUNTER — Other Ambulatory Visit: Payer: Self-pay

## 2021-04-01 VITALS — BP 139/74 | HR 54 | Temp 98.2°F | Resp 18 | Ht 64.0 in | Wt 165.4 lb

## 2021-04-01 DIAGNOSIS — D693 Immune thrombocytopenic purpura: Secondary | ICD-10-CM

## 2021-04-01 LAB — CBC WITH DIFFERENTIAL (CANCER CENTER ONLY)
Abs Immature Granulocytes: 0.13 10*3/uL — ABNORMAL HIGH (ref 0.00–0.07)
Basophils Absolute: 0.1 10*3/uL (ref 0.0–0.1)
Basophils Relative: 2 %
Eosinophils Absolute: 0.2 10*3/uL (ref 0.0–0.5)
Eosinophils Relative: 3 %
HCT: 37 % — ABNORMAL LOW (ref 39.0–52.0)
Hemoglobin: 11.5 g/dL — ABNORMAL LOW (ref 13.0–17.0)
Immature Granulocytes: 2 %
Lymphocytes Relative: 16 %
Lymphs Abs: 1.2 10*3/uL (ref 0.7–4.0)
MCH: 27.1 pg (ref 26.0–34.0)
MCHC: 31.1 g/dL (ref 30.0–36.0)
MCV: 87.3 fL (ref 80.0–100.0)
Monocytes Absolute: 1.5 10*3/uL — ABNORMAL HIGH (ref 0.1–1.0)
Monocytes Relative: 20 %
Neutro Abs: 4.3 10*3/uL (ref 1.7–7.7)
Neutrophils Relative %: 57 %
Platelet Count: 103 10*3/uL — ABNORMAL LOW (ref 150–400)
RBC: 4.24 MIL/uL (ref 4.22–5.81)
RDW: 15.3 % (ref 11.5–15.5)
WBC Count: 7.4 10*3/uL (ref 4.0–10.5)
nRBC: 0 % (ref 0.0–0.2)

## 2021-04-01 MED ORDER — ROMIPLOSTIM 125 MCG ~~LOC~~ SOLR
1.0000 ug/kg | Freq: Once | SUBCUTANEOUS | Status: AC
  Administered 2021-04-01: 75 ug via SUBCUTANEOUS
  Filled 2021-04-01: qty 0.15

## 2021-04-01 NOTE — Patient Instructions (Signed)
Romiplostim injection ?What is this medication? ?ROMIPLOSTIM (roe mi PLOE stim) helps your body make more platelets. This medicine is used to treat low platelets caused by chronic idiopathic thrombocytopenic purpura (ITP) or a bone marrow syndrome caused by radiation sickness. ?This medicine may be used for other purposes; ask your health care provider or pharmacist if you have questions. ?COMMON BRAND NAME(S): Nplate ?What should I tell my care team before I take this medication? ?They need to know if you have any of these conditions: ?blood clots ?myelodysplastic syndrome ?an unusual or allergic reaction to romiplostim, mannitol, other medicines, foods, dyes, or preservatives ?pregnant or trying to get pregnant ?breast-feeding ?How should I use this medication? ?This medicine is injected under the skin. It is given by a health care provider in a hospital or clinic setting. ?A special MedGuide will be given to you before each treatment. Be sure to read this information carefully each time. ?Talk to your health care provider about the use of this medicine in children. While it may be prescribed for children as young as newborns for selected conditions, precautions do apply. ?Overdosage: If you think you have taken too much of this medicine contact a poison control center or emergency room at once. ?NOTE: This medicine is only for you. Do not share this medicine with others. ?What if I miss a dose? ?Keep appointments for follow-up doses. It is important not to miss your dose. Call your health care provider if you are unable to keep an appointment. ?What may interact with this medication? ?Interactions are not expected. ?This list may not describe all possible interactions. Give your health care provider a list of all the medicines, herbs, non-prescription drugs, or dietary supplements you use. Also tell them if you smoke, drink alcohol, or use illegal drugs. Some items may interact with your medicine. ?What should I  watch for while using this medication? ?Visit your health care provider for regular checks on your progress. You may need blood work done while you are taking this medicine. Your condition will be monitored carefully while you are receiving this medicine. It is important not to miss any appointments. ?What side effects may I notice from receiving this medication? ?Side effects that you should report to your doctor or health care professional as soon as possible: ?allergic reactions (skin rash, itching or hives; swelling of the face, lips, or tongue) ?bleeding (bloody or black, tarry stools; red or dark brown urine; spitting up blood or brown material that looks like coffee grounds; red spots on the skin; unusual bruising or bleeding from the eyes, gums, or nose) ?blood clot (chest pain; shortness of breath; pain, swelling, or warmth in the leg) ?stroke (changes in vision; confusion; trouble speaking or understanding; severe headaches; sudden numbness or weakness of the face, arm or leg; trouble walking; dizziness; loss of balance or coordination) ?Side effects that usually do not require medical attention (report to your doctor or health care professional if they continue or are bothersome): ?diarrhea ?dizziness ?headache ?joint pain ?muscle pain ?stomach pain ?trouble sleeping ?This list may not describe all possible side effects. Call your doctor for medical advice about side effects. You may report side effects to FDA at 1-800-FDA-1088. ?Where should I keep my medication? ?This medicine is given in a hospital or clinic. It will not be stored at home. ?NOTE: This sheet is a summary. It may not cover all possible information. If you have questions about this medicine, talk to your doctor, pharmacist, or health care provider. ??   2022 Elsevier/Gold Standard (2020-11-06 00:00:00) ? ?

## 2021-04-08 ENCOUNTER — Inpatient Hospital Stay: Payer: Medicare Other

## 2021-04-08 ENCOUNTER — Inpatient Hospital Stay (HOSPITAL_BASED_OUTPATIENT_CLINIC_OR_DEPARTMENT_OTHER): Payer: Medicare Other | Admitting: Nurse Practitioner

## 2021-04-08 ENCOUNTER — Inpatient Hospital Stay: Payer: Medicare Other | Attending: Nurse Practitioner

## 2021-04-08 ENCOUNTER — Other Ambulatory Visit: Payer: Self-pay

## 2021-04-08 ENCOUNTER — Encounter: Payer: Self-pay | Admitting: Nurse Practitioner

## 2021-04-08 ENCOUNTER — Inpatient Hospital Stay: Payer: Medicare Other | Admitting: Nurse Practitioner

## 2021-04-08 VITALS — BP 148/74 | HR 60 | Temp 97.9°F | Resp 18 | Ht 64.0 in | Wt 163.2 lb

## 2021-04-08 DIAGNOSIS — D693 Immune thrombocytopenic purpura: Secondary | ICD-10-CM | POA: Diagnosis not present

## 2021-04-08 DIAGNOSIS — D696 Thrombocytopenia, unspecified: Secondary | ICD-10-CM | POA: Diagnosis not present

## 2021-04-08 LAB — CBC WITH DIFFERENTIAL (CANCER CENTER ONLY)
Abs Immature Granulocytes: 0.16 10*3/uL — ABNORMAL HIGH (ref 0.00–0.07)
Basophils Absolute: 0.1 10*3/uL (ref 0.0–0.1)
Basophils Relative: 1 %
Eosinophils Absolute: 0.2 10*3/uL (ref 0.0–0.5)
Eosinophils Relative: 2 %
HCT: 36.1 % — ABNORMAL LOW (ref 39.0–52.0)
Hemoglobin: 11.5 g/dL — ABNORMAL LOW (ref 13.0–17.0)
Immature Granulocytes: 2 %
Lymphocytes Relative: 19 %
Lymphs Abs: 1.5 10*3/uL (ref 0.7–4.0)
MCH: 27.3 pg (ref 26.0–34.0)
MCHC: 31.9 g/dL (ref 30.0–36.0)
MCV: 85.5 fL (ref 80.0–100.0)
Monocytes Absolute: 1.8 10*3/uL — ABNORMAL HIGH (ref 0.1–1.0)
Monocytes Relative: 23 %
Neutro Abs: 4.3 10*3/uL (ref 1.7–7.7)
Neutrophils Relative %: 53 %
Platelet Count: 85 10*3/uL — ABNORMAL LOW (ref 150–400)
RBC: 4.22 MIL/uL (ref 4.22–5.81)
RDW: 15.3 % (ref 11.5–15.5)
WBC Count: 8 10*3/uL (ref 4.0–10.5)
nRBC: 0 % (ref 0.0–0.2)

## 2021-04-08 MED ORDER — ROMIPLOSTIM 125 MCG ~~LOC~~ SOLR
1.0000 ug/kg | Freq: Once | SUBCUTANEOUS | Status: AC
  Administered 2021-04-08: 75 ug via SUBCUTANEOUS
  Filled 2021-04-08: qty 0.15

## 2021-04-08 NOTE — Progress Notes (Signed)
[facility] °OFFICE PROGRESS NOTE ° ° °Diagnosis: ITP ° °INTERVAL HISTORY:  ° °[name] returns as scheduled.  He continues weekly Nplate.  No bleeding.  No significant bruising.  Overall he feels well.  He describes his appetite as "so-so".  He reports his weight is stable.  No change in baseline dyspnea on exertion. ° °Objective: ° °Vital signs in last 24 hours: ° °Blood pressure (!) 148/74, pulse 60, temperature 97.9 °F (36.6 °C), temperature source Oral, resp. rate 18, height 5' 4" (1.626 m), weight 163 lb 3.2 oz (74 kg), SpO2 98 %. °  ° °HEENT: No thrush or ulcers.  No bleeding noted within the oral cavity. °Resp: Lungs clear bilaterally. °Cardio: Regular rate and rhythm. °GI: No hepatosplenomegaly. °Vascular: No leg edema. ° ° °Lab Results: ° °Lab Results  °Component Value Date  ° WBC 8.0 04/08/2021  ° HGB 11.5 (L) 04/08/2021  ° HCT 36.1 (L) 04/08/2021  ° MCV 85.5 04/08/2021  ° PLT 85 (L) 04/08/2021  ° NEUTROABS 4.3 04/08/2021  ° ° °Imaging: ° °No results found. ° °Medications: I have reviewed the patient's current medications. ° °Assessment/Plan: °Thrombocytopenia °Bone marrow biopsy 07/08/2016-no evidence of B-cell lymphoma, 40% cellular bone marrow with trilineage hematopoiesis, megakaryocytes present with normal morphology, normal cytogenetics, negative for BRAF mutation °Flow cytometry 01/05/2009-no monoclonal B-cell or phenotypically abnormal T-cell population °Prednisone starting 06/29/2019 °Nplate 07/04/2019, 07/11/2019, Nplate 07/18/2019 °Prednisone 20 mg daily beginning 07/25/2019 °Platelets 11,000 on 08/15/2019, prednisone increased to 40 mg daily, Nplate resume 08/17/2019  °Prednisone decreased to 30 mg daily 08/25/2019, Nplate continued °Prednisone continued at 30 mg daily 09/13/2019, weekly Nplate continued °Prednisone taper to 20 mg daily 09/23/2019, weekly Nplate continued °Prednisone taper to 10 mg daily 09/30/2019, weekly Nplate continued °Prednisone taper to 5 mg daily 10/21/2019, weekly  Nplate continued °Prednisone taper to 5 mg every other day for 5 doses then stop 11/11/2019, weekly Nplate continued °Nplate last given 11/21/2019 °Promacta cost prohibitive °Nplate beginning 08/01/2020, held 08/27/2020 and 09/03/2020 due to patient vacation °Nplate resumed 09/10/2020 °Nplate held 10/01/2020 secondary to an elevated platelet count °Nplate resumed with dose reduction to 1 mcg/kg 10/09/2020 °Nplate dose decreased to 0.5 mcg/kg 10/29/2020 °Nplate dose increased to 1 mcg/kg 11/07/2020 °Hairy cell leukemia 1982, status post splenectomy °Coronary artery disease °Aortic stenosis °Liposarcoma at the right chest wall resected in 2013 °Macular degeneration °Hearing loss °Basal cell carcinoma left cheek 05/24/2019 °Left upper lobe nodule, faint FDG activity on PET at [facility] 05/31/2013 °Status post TAVR procedure 09/06/2019 °Admission with Pseudomonas urosepsis 09/16/2019 °Acute left MCA distribution CVA 09/16/2019 °Covid January 2022 °Mild anemia, 1 of 3 stool Hemoccult cards positive for occult blood 10/14/2020, ferritin low 06/29/2020 °  °  ° °Disposition: [name] remains stable from a hematologic standpoint.  Plan to continue weekly Nplate. ° °CBC from today reviewed.  Platelet count is lower than last week but overall stable.  Plan to continue weekly Nplate at the current dose. ° °We will see him in follow-up in 4 weeks.  He will contact the office in the interim with any problems. ° ° °Lisa Thomas ANP/GNP-BC  ° °04/08/2021  °8:17 AM ° ° ° ° ° ° ° °

## 2021-04-08 NOTE — Patient Instructions (Signed)
Romiplostim injection ?What is this medication? ?ROMIPLOSTIM (roe mi PLOE stim) helps your body make more platelets. This medicine is used to treat low platelets caused by chronic idiopathic thrombocytopenic purpura (ITP) or a bone marrow syndrome caused by radiation sickness. ?This medicine may be used for other purposes; ask your health care provider or pharmacist if you have questions. ?COMMON BRAND NAME(S): Nplate ?What should I tell my care team before I take this medication? ?They need to know if you have any of these conditions: ?blood clots ?myelodysplastic syndrome ?an unusual or allergic reaction to romiplostim, mannitol, other medicines, foods, dyes, or preservatives ?pregnant or trying to get pregnant ?breast-feeding ?How should I use this medication? ?This medicine is injected under the skin. It is given by a health care provider in a hospital or clinic setting. ?A special MedGuide will be given to you before each treatment. Be sure to read this information carefully each time. ?Talk to your health care provider about the use of this medicine in children. While it may be prescribed for children as young as newborns for selected conditions, precautions do apply. ?Overdosage: If you think you have taken too much of this medicine contact a poison control center or emergency room at once. ?NOTE: This medicine is only for you. Do not share this medicine with others. ?What if I miss a dose? ?Keep appointments for follow-up doses. It is important not to miss your dose. Call your health care provider if you are unable to keep an appointment. ?What may interact with this medication? ?Interactions are not expected. ?This list may not describe all possible interactions. Give your health care provider a list of all the medicines, herbs, non-prescription drugs, or dietary supplements you use. Also tell them if you smoke, drink alcohol, or use illegal drugs. Some items may interact with your medicine. ?What should I  watch for while using this medication? ?Visit your health care provider for regular checks on your progress. You may need blood work done while you are taking this medicine. Your condition will be monitored carefully while you are receiving this medicine. It is important not to miss any appointments. ?What side effects may I notice from receiving this medication? ?Side effects that you should report to your doctor or health care professional as soon as possible: ?allergic reactions (skin rash, itching or hives; swelling of the face, lips, or tongue) ?bleeding (bloody or black, tarry stools; red or dark brown urine; spitting up blood or brown material that looks like coffee grounds; red spots on the skin; unusual bruising or bleeding from the eyes, gums, or nose) ?blood clot (chest pain; shortness of breath; pain, swelling, or warmth in the leg) ?stroke (changes in vision; confusion; trouble speaking or understanding; severe headaches; sudden numbness or weakness of the face, arm or leg; trouble walking; dizziness; loss of balance or coordination) ?Side effects that usually do not require medical attention (report to your doctor or health care professional if they continue or are bothersome): ?diarrhea ?dizziness ?headache ?joint pain ?muscle pain ?stomach pain ?trouble sleeping ?This list may not describe all possible side effects. Call your doctor for medical advice about side effects. You may report side effects to FDA at 1-800-FDA-1088. ?Where should I keep my medication? ?This medicine is given in a hospital or clinic. It will not be stored at home. ?NOTE: This sheet is a summary. It may not cover all possible information. If you have questions about this medicine, talk to your doctor, pharmacist, or health care provider. ??   2022 Elsevier/Gold Standard (2020-11-06 00:00:00) ? ?

## 2021-04-15 ENCOUNTER — Other Ambulatory Visit: Payer: Self-pay

## 2021-04-15 ENCOUNTER — Inpatient Hospital Stay: Payer: Medicare Other

## 2021-04-15 VITALS — BP 158/83 | HR 57 | Temp 97.4°F | Resp 18 | Ht 64.0 in | Wt 164.2 lb

## 2021-04-15 DIAGNOSIS — D693 Immune thrombocytopenic purpura: Secondary | ICD-10-CM

## 2021-04-15 DIAGNOSIS — D696 Thrombocytopenia, unspecified: Secondary | ICD-10-CM | POA: Diagnosis not present

## 2021-04-15 LAB — CBC WITH DIFFERENTIAL (CANCER CENTER ONLY)
Abs Immature Granulocytes: 0.17 10*3/uL — ABNORMAL HIGH (ref 0.00–0.07)
Basophils Absolute: 0.1 10*3/uL (ref 0.0–0.1)
Basophils Relative: 1 %
Eosinophils Absolute: 0.2 10*3/uL (ref 0.0–0.5)
Eosinophils Relative: 3 %
HCT: 36.5 % — ABNORMAL LOW (ref 39.0–52.0)
Hemoglobin: 11.4 g/dL — ABNORMAL LOW (ref 13.0–17.0)
Immature Granulocytes: 2 %
Lymphocytes Relative: 18 %
Lymphs Abs: 1.4 10*3/uL (ref 0.7–4.0)
MCH: 27 pg (ref 26.0–34.0)
MCHC: 31.2 g/dL (ref 30.0–36.0)
MCV: 86.5 fL (ref 80.0–100.0)
Monocytes Absolute: 1.5 10*3/uL — ABNORMAL HIGH (ref 0.1–1.0)
Monocytes Relative: 19 %
Neutro Abs: 4.2 10*3/uL (ref 1.7–7.7)
Neutrophils Relative %: 57 %
Platelet Count: 132 10*3/uL — ABNORMAL LOW (ref 150–400)
RBC: 4.22 MIL/uL (ref 4.22–5.81)
RDW: 15.7 % — ABNORMAL HIGH (ref 11.5–15.5)
WBC Count: 7.5 10*3/uL (ref 4.0–10.5)
nRBC: 0 % (ref 0.0–0.2)

## 2021-04-15 MED ORDER — ROMIPLOSTIM 125 MCG ~~LOC~~ SOLR
1.0000 ug/kg | Freq: Once | SUBCUTANEOUS | Status: AC
  Administered 2021-04-15: 75 ug via SUBCUTANEOUS
  Filled 2021-04-15: qty 0.15

## 2021-04-15 NOTE — Patient Instructions (Signed)
Romiplostim injection ?What is this medication? ?ROMIPLOSTIM (roe mi PLOE stim) helps your body make more platelets. This medicine is used to treat low platelets caused by chronic idiopathic thrombocytopenic purpura (ITP) or a bone marrow syndrome caused by radiation sickness. ?This medicine may be used for other purposes; ask your health care provider or pharmacist if you have questions. ?COMMON BRAND NAME(S): Nplate ?What should I tell my care team before I take this medication? ?They need to know if you have any of these conditions: ?blood clots ?myelodysplastic syndrome ?an unusual or allergic reaction to romiplostim, mannitol, other medicines, foods, dyes, or preservatives ?pregnant or trying to get pregnant ?breast-feeding ?How should I use this medication? ?This medicine is injected under the skin. It is given by a health care provider in a hospital or clinic setting. ?A special MedGuide will be given to you before each treatment. Be sure to read this information carefully each time. ?Talk to your health care provider about the use of this medicine in children. While it may be prescribed for children as young as newborns for selected conditions, precautions do apply. ?Overdosage: If you think you have taken too much of this medicine contact a poison control center or emergency room at once. ?NOTE: This medicine is only for you. Do not share this medicine with others. ?What if I miss a dose? ?Keep appointments for follow-up doses. It is important not to miss your dose. Call your health care provider if you are unable to keep an appointment. ?What may interact with this medication? ?Interactions are not expected. ?This list may not describe all possible interactions. Give your health care provider a list of all the medicines, herbs, non-prescription drugs, or dietary supplements you use. Also tell them if you smoke, drink alcohol, or use illegal drugs. Some items may interact with your medicine. ?What should I  watch for while using this medication? ?Visit your health care provider for regular checks on your progress. You may need blood work done while you are taking this medicine. Your condition will be monitored carefully while you are receiving this medicine. It is important not to miss any appointments. ?What side effects may I notice from receiving this medication? ?Side effects that you should report to your doctor or health care professional as soon as possible: ?allergic reactions (skin rash, itching or hives; swelling of the face, lips, or tongue) ?bleeding (bloody or black, tarry stools; red or dark brown urine; spitting up blood or brown material that looks like coffee grounds; red spots on the skin; unusual bruising or bleeding from the eyes, gums, or nose) ?blood clot (chest pain; shortness of breath; pain, swelling, or warmth in the leg) ?stroke (changes in vision; confusion; trouble speaking or understanding; severe headaches; sudden numbness or weakness of the face, arm or leg; trouble walking; dizziness; loss of balance or coordination) ?Side effects that usually do not require medical attention (report to your doctor or health care professional if they continue or are bothersome): ?diarrhea ?dizziness ?headache ?joint pain ?muscle pain ?stomach pain ?trouble sleeping ?This list may not describe all possible side effects. Call your doctor for medical advice about side effects. You may report side effects to FDA at 1-800-FDA-1088. ?Where should I keep my medication? ?This medicine is given in a hospital or clinic. It will not be stored at home. ?NOTE: This sheet is a summary. It may not cover all possible information. If you have questions about this medicine, talk to your doctor, pharmacist, or health care provider. ??   2022 Elsevier/Gold Standard (2020-11-06 00:00:00) ? ?

## 2021-04-22 ENCOUNTER — Inpatient Hospital Stay: Payer: Medicare Other

## 2021-04-22 ENCOUNTER — Other Ambulatory Visit: Payer: Self-pay

## 2021-04-22 VITALS — BP 153/80 | HR 58 | Temp 98.2°F | Resp 18 | Ht 64.0 in | Wt 167.7 lb

## 2021-04-22 DIAGNOSIS — D693 Immune thrombocytopenic purpura: Secondary | ICD-10-CM

## 2021-04-22 DIAGNOSIS — D696 Thrombocytopenia, unspecified: Secondary | ICD-10-CM | POA: Diagnosis not present

## 2021-04-22 LAB — CBC WITH DIFFERENTIAL (CANCER CENTER ONLY)
Abs Immature Granulocytes: 0.16 10*3/uL — ABNORMAL HIGH (ref 0.00–0.07)
Basophils Absolute: 0.1 10*3/uL (ref 0.0–0.1)
Basophils Relative: 1 %
Eosinophils Absolute: 0.2 10*3/uL (ref 0.0–0.5)
Eosinophils Relative: 3 %
HCT: 37.1 % — ABNORMAL LOW (ref 39.0–52.0)
Hemoglobin: 11.6 g/dL — ABNORMAL LOW (ref 13.0–17.0)
Immature Granulocytes: 2 %
Lymphocytes Relative: 19 %
Lymphs Abs: 1.5 10*3/uL (ref 0.7–4.0)
MCH: 27 pg (ref 26.0–34.0)
MCHC: 31.3 g/dL (ref 30.0–36.0)
MCV: 86.5 fL (ref 80.0–100.0)
Monocytes Absolute: 1.6 10*3/uL — ABNORMAL HIGH (ref 0.1–1.0)
Monocytes Relative: 20 %
Neutro Abs: 4.3 10*3/uL (ref 1.7–7.7)
Neutrophils Relative %: 55 %
Platelet Count: 122 10*3/uL — ABNORMAL LOW (ref 150–400)
RBC: 4.29 MIL/uL (ref 4.22–5.81)
RDW: 15.9 % — ABNORMAL HIGH (ref 11.5–15.5)
WBC Count: 7.8 10*3/uL (ref 4.0–10.5)
nRBC: 0 % (ref 0.0–0.2)

## 2021-04-22 MED ORDER — ROMIPLOSTIM 125 MCG ~~LOC~~ SOLR
1.0000 ug/kg | Freq: Once | SUBCUTANEOUS | Status: AC
  Administered 2021-04-22: 75 ug via SUBCUTANEOUS
  Filled 2021-04-22: qty 0.15

## 2021-04-22 NOTE — Patient Instructions (Signed)
Romiplostim injection ?What is this medication? ?ROMIPLOSTIM (roe mi PLOE stim) helps your body make more platelets. This medicine is used to treat low platelets caused by chronic idiopathic thrombocytopenic purpura (ITP) or a bone marrow syndrome caused by radiation sickness. ?This medicine may be used for other purposes; ask your health care provider or pharmacist if you have questions. ?COMMON BRAND NAME(S): Nplate ?What should I tell my care team before I take this medication? ?They need to know if you have any of these conditions: ?blood clots ?myelodysplastic syndrome ?an unusual or allergic reaction to romiplostim, mannitol, other medicines, foods, dyes, or preservatives ?pregnant or trying to get pregnant ?breast-feeding ?How should I use this medication? ?This medicine is injected under the skin. It is given by a health care provider in a hospital or clinic setting. ?A special MedGuide will be given to you before each treatment. Be sure to read this information carefully each time. ?Talk to your health care provider about the use of this medicine in children. While it may be prescribed for children as young as newborns for selected conditions, precautions do apply. ?Overdosage: If you think you have taken too much of this medicine contact a poison control center or emergency room at once. ?NOTE: This medicine is only for you. Do not share this medicine with others. ?What if I miss a dose? ?Keep appointments for follow-up doses. It is important not to miss your dose. Call your health care provider if you are unable to keep an appointment. ?What may interact with this medication? ?Interactions are not expected. ?This list may not describe all possible interactions. Give your health care provider a list of all the medicines, herbs, non-prescription drugs, or dietary supplements you use. Also tell them if you smoke, drink alcohol, or use illegal drugs. Some items may interact with your medicine. ?What should I  watch for while using this medication? ?Visit your health care provider for regular checks on your progress. You may need blood work done while you are taking this medicine. Your condition will be monitored carefully while you are receiving this medicine. It is important not to miss any appointments. ?What side effects may I notice from receiving this medication? ?Side effects that you should report to your doctor or health care professional as soon as possible: ?allergic reactions (skin rash, itching or hives; swelling of the face, lips, or tongue) ?bleeding (bloody or black, tarry stools; red or dark brown urine; spitting up blood or brown material that looks like coffee grounds; red spots on the skin; unusual bruising or bleeding from the eyes, gums, or nose) ?blood clot (chest pain; shortness of breath; pain, swelling, or warmth in the leg) ?stroke (changes in vision; confusion; trouble speaking or understanding; severe headaches; sudden numbness or weakness of the face, arm or leg; trouble walking; dizziness; loss of balance or coordination) ?Side effects that usually do not require medical attention (report to your doctor or health care professional if they continue or are bothersome): ?diarrhea ?dizziness ?headache ?joint pain ?muscle pain ?stomach pain ?trouble sleeping ?This list may not describe all possible side effects. Call your doctor for medical advice about side effects. You may report side effects to FDA at 1-800-FDA-1088. ?Where should I keep my medication? ?This medicine is given in a hospital or clinic. It will not be stored at home. ?NOTE: This sheet is a summary. It may not cover all possible information. If you have questions about this medicine, talk to your doctor, pharmacist, or health care provider. ??   2022 Elsevier/Gold Standard (2020-11-06 00:00:00) ? ?

## 2021-04-23 DIAGNOSIS — H353221 Exudative age-related macular degeneration, left eye, with active choroidal neovascularization: Secondary | ICD-10-CM | POA: Diagnosis not present

## 2021-04-23 DIAGNOSIS — H353111 Nonexudative age-related macular degeneration, right eye, early dry stage: Secondary | ICD-10-CM | POA: Diagnosis not present

## 2021-04-23 DIAGNOSIS — H35373 Puckering of macula, bilateral: Secondary | ICD-10-CM | POA: Diagnosis not present

## 2021-04-23 DIAGNOSIS — H43821 Vitreomacular adhesion, right eye: Secondary | ICD-10-CM | POA: Diagnosis not present

## 2021-04-23 DIAGNOSIS — H43812 Vitreous degeneration, left eye: Secondary | ICD-10-CM | POA: Diagnosis not present

## 2021-04-23 DIAGNOSIS — H35412 Lattice degeneration of retina, left eye: Secondary | ICD-10-CM | POA: Diagnosis not present

## 2021-04-29 ENCOUNTER — Inpatient Hospital Stay: Payer: Medicare Other

## 2021-04-29 ENCOUNTER — Other Ambulatory Visit: Payer: Self-pay

## 2021-04-29 VITALS — BP 130/66 | HR 55 | Temp 98.5°F | Resp 20 | Wt 166.0 lb

## 2021-04-29 DIAGNOSIS — D693 Immune thrombocytopenic purpura: Secondary | ICD-10-CM

## 2021-04-29 DIAGNOSIS — D696 Thrombocytopenia, unspecified: Secondary | ICD-10-CM | POA: Diagnosis not present

## 2021-04-29 LAB — CBC WITH DIFFERENTIAL (CANCER CENTER ONLY)
Abs Immature Granulocytes: 0.14 10*3/uL — ABNORMAL HIGH (ref 0.00–0.07)
Basophils Absolute: 0.1 10*3/uL (ref 0.0–0.1)
Basophils Relative: 2 %
Eosinophils Absolute: 0.2 10*3/uL (ref 0.0–0.5)
Eosinophils Relative: 3 %
HCT: 37.1 % — ABNORMAL LOW (ref 39.0–52.0)
Hemoglobin: 11.6 g/dL — ABNORMAL LOW (ref 13.0–17.0)
Immature Granulocytes: 2 %
Lymphocytes Relative: 19 %
Lymphs Abs: 1.4 10*3/uL (ref 0.7–4.0)
MCH: 26.9 pg (ref 26.0–34.0)
MCHC: 31.3 g/dL (ref 30.0–36.0)
MCV: 85.9 fL (ref 80.0–100.0)
Monocytes Absolute: 1.5 10*3/uL — ABNORMAL HIGH (ref 0.1–1.0)
Monocytes Relative: 21 %
Neutro Abs: 3.9 10*3/uL (ref 1.7–7.7)
Neutrophils Relative %: 53 %
Platelet Count: 87 10*3/uL — ABNORMAL LOW (ref 150–400)
RBC: 4.32 MIL/uL (ref 4.22–5.81)
RDW: 15.8 % — ABNORMAL HIGH (ref 11.5–15.5)
WBC Count: 7.2 10*3/uL (ref 4.0–10.5)
nRBC: 0 % (ref 0.0–0.2)

## 2021-04-29 MED ORDER — ROMIPLOSTIM 125 MCG ~~LOC~~ SOLR
1.0000 ug/kg | Freq: Once | SUBCUTANEOUS | Status: AC
  Administered 2021-04-29: 75 ug via SUBCUTANEOUS
  Filled 2021-04-29: qty 0.15

## 2021-04-29 NOTE — Patient Instructions (Signed)
Romiplostim injection ?What is this medication? ?ROMIPLOSTIM (roe mi PLOE stim) helps your body make more platelets. This medicine is used to treat low platelets caused by chronic idiopathic thrombocytopenic purpura (ITP) or a bone marrow syndrome caused by radiation sickness. ?This medicine may be used for other purposes; ask your health care provider or pharmacist if you have questions. ?COMMON BRAND NAME(S): Nplate ?What should I tell my care team before I take this medication? ?They need to know if you have any of these conditions: ?blood clots ?myelodysplastic syndrome ?an unusual or allergic reaction to romiplostim, mannitol, other medicines, foods, dyes, or preservatives ?pregnant or trying to get pregnant ?breast-feeding ?How should I use this medication? ?This medicine is injected under the skin. It is given by a health care provider in a hospital or clinic setting. ?A special MedGuide will be given to you before each treatment. Be sure to read this information carefully each time. ?Talk to your health care provider about the use of this medicine in children. While it may be prescribed for children as young as newborns for selected conditions, precautions do apply. ?Overdosage: If you think you have taken too much of this medicine contact a poison control center or emergency room at once. ?NOTE: This medicine is only for you. Do not share this medicine with others. ?What if I miss a dose? ?Keep appointments for follow-up doses. It is important not to miss your dose. Call your health care provider if you are unable to keep an appointment. ?What may interact with this medication? ?Interactions are not expected. ?This list may not describe all possible interactions. Give your health care provider a list of all the medicines, herbs, non-prescription drugs, or dietary supplements you use. Also tell them if you smoke, drink alcohol, or use illegal drugs. Some items may interact with your medicine. ?What should I  watch for while using this medication? ?Visit your health care provider for regular checks on your progress. You may need blood work done while you are taking this medicine. Your condition will be monitored carefully while you are receiving this medicine. It is important not to miss any appointments. ?What side effects may I notice from receiving this medication? ?Side effects that you should report to your doctor or health care professional as soon as possible: ?allergic reactions (skin rash, itching or hives; swelling of the face, lips, or tongue) ?bleeding (bloody or black, tarry stools; red or dark brown urine; spitting up blood or brown material that looks like coffee grounds; red spots on the skin; unusual bruising or bleeding from the eyes, gums, or nose) ?blood clot (chest pain; shortness of breath; pain, swelling, or warmth in the leg) ?stroke (changes in vision; confusion; trouble speaking or understanding; severe headaches; sudden numbness or weakness of the face, arm or leg; trouble walking; dizziness; loss of balance or coordination) ?Side effects that usually do not require medical attention (report to your doctor or health care professional if they continue or are bothersome): ?diarrhea ?dizziness ?headache ?joint pain ?muscle pain ?stomach pain ?trouble sleeping ?This list may not describe all possible side effects. Call your doctor for medical advice about side effects. You may report side effects to FDA at 1-800-FDA-1088. ?Where should I keep my medication? ?This medicine is given in a hospital or clinic. It will not be stored at home. ?NOTE: This sheet is a summary. It may not cover all possible information. If you have questions about this medicine, talk to your doctor, pharmacist, or health care provider. ??   2022 Elsevier/Gold Standard (2020-11-06 00:00:00) ? ?

## 2021-04-30 DIAGNOSIS — I10 Essential (primary) hypertension: Secondary | ICD-10-CM | POA: Diagnosis not present

## 2021-04-30 DIAGNOSIS — E785 Hyperlipidemia, unspecified: Secondary | ICD-10-CM | POA: Diagnosis not present

## 2021-04-30 DIAGNOSIS — I2510 Atherosclerotic heart disease of native coronary artery without angina pectoris: Secondary | ICD-10-CM | POA: Diagnosis not present

## 2021-04-30 DIAGNOSIS — I480 Paroxysmal atrial fibrillation: Secondary | ICD-10-CM | POA: Diagnosis not present

## 2021-05-06 ENCOUNTER — Other Ambulatory Visit: Payer: Self-pay

## 2021-05-06 ENCOUNTER — Inpatient Hospital Stay (HOSPITAL_BASED_OUTPATIENT_CLINIC_OR_DEPARTMENT_OTHER): Payer: Medicare Other | Admitting: Oncology

## 2021-05-06 ENCOUNTER — Inpatient Hospital Stay: Payer: Medicare Other | Attending: Nurse Practitioner

## 2021-05-06 ENCOUNTER — Inpatient Hospital Stay: Payer: Medicare Other

## 2021-05-06 VITALS — BP 133/60 | HR 90 | Temp 98.1°F | Resp 20 | Ht 64.0 in | Wt 168.2 lb

## 2021-05-06 DIAGNOSIS — D693 Immune thrombocytopenic purpura: Secondary | ICD-10-CM

## 2021-05-06 DIAGNOSIS — D696 Thrombocytopenia, unspecified: Secondary | ICD-10-CM | POA: Insufficient documentation

## 2021-05-06 LAB — CBC WITH DIFFERENTIAL (CANCER CENTER ONLY)
Abs Immature Granulocytes: 0.19 10*3/uL — ABNORMAL HIGH (ref 0.00–0.07)
Basophils Absolute: 0.1 10*3/uL (ref 0.0–0.1)
Basophils Relative: 1 %
Eosinophils Absolute: 0.2 10*3/uL (ref 0.0–0.5)
Eosinophils Relative: 2 %
HCT: 36.2 % — ABNORMAL LOW (ref 39.0–52.0)
Hemoglobin: 11.5 g/dL — ABNORMAL LOW (ref 13.0–17.0)
Immature Granulocytes: 2 %
Lymphocytes Relative: 16 %
Lymphs Abs: 1.2 10*3/uL (ref 0.7–4.0)
MCH: 27.3 pg (ref 26.0–34.0)
MCHC: 31.8 g/dL (ref 30.0–36.0)
MCV: 85.8 fL (ref 80.0–100.0)
Monocytes Absolute: 1.6 10*3/uL — ABNORMAL HIGH (ref 0.1–1.0)
Monocytes Relative: 21 %
Neutro Abs: 4.5 10*3/uL (ref 1.7–7.7)
Neutrophils Relative %: 58 %
Platelet Count: 62 10*3/uL — ABNORMAL LOW (ref 150–400)
RBC: 4.22 MIL/uL (ref 4.22–5.81)
RDW: 15.8 % — ABNORMAL HIGH (ref 11.5–15.5)
WBC Count: 7.9 10*3/uL (ref 4.0–10.5)
nRBC: 0 % (ref 0.0–0.2)

## 2021-05-06 MED ORDER — ROMIPLOSTIM 125 MCG ~~LOC~~ SOLR
1.0000 ug/kg | Freq: Once | SUBCUTANEOUS | Status: AC
  Administered 2021-05-06: 75 ug via SUBCUTANEOUS
  Filled 2021-05-06: qty 0.15

## 2021-05-06 NOTE — Progress Notes (Signed)
?[place] ?OFFICE PROGRESS NOTE ? ? ?Diagnosis: Thrombocytopenia ? ?INTERVAL HISTORY:  ? ?[name] returns as scheduled.  He continues weekly Nplate.  No bleeding.  No new complaint.  He has not been scheduled for the virtual colonoscopy. ? ?Objective: ? ?Vital signs in last 24 hours: ? ?Blood pressure 133/60, pulse 90, temperature 98.1 ?F (36.7 ?C), temperature source Oral, resp. rate 20, height '5\' 4"'  (1.626 m), weight 168 lb 3.2 oz (76.3 kg), SpO2 100 %. ?  ? ?HEENT: No bleeding ?Resp: End inspiratory rhonchi at the right posterior base, no respiratory distress ?Cardio: Regular rate and rhythm ?GI: No hepatosplenomegaly ?Vascular: No leg edema ? ? ?Lab Results: ? ?Lab Results  ?Component Value Date  ? WBC 7.9 05/06/2021  ? HGB 11.5 (L) 05/06/2021  ? HCT 36.2 (L) 05/06/2021  ? MCV 85.8 05/06/2021  ? PLT 62 (L) 05/06/2021  ? NEUTROABS 4.5 05/06/2021  ? ? ?CMP  ?Lab Results  ?Component Value Date  ? NA 141 08/20/2020  ? K 4.2 08/20/2020  ? CL 108 08/20/2020  ? CO2 25 08/20/2020  ? GLUCOSE 113 (H) 08/20/2020  ? BUN 27 (H) 08/20/2020  ? CREATININE 1.27 (H) 08/20/2020  ? CALCIUM 9.1 08/20/2020  ? PROT 6.5 08/20/2020  ? ALBUMIN 3.9 08/20/2020  ? AST 18 08/20/2020  ? ALT 14 08/20/2020  ? ALKPHOS 106 08/20/2020  ? BILITOT 0.4 08/20/2020  ? GFRNONAA 52 (L) 08/20/2020  ? GFRAA 59 (L) 10/31/2019  ? ? ? ?Medications: I have reviewed the patient's current medications. ? ? ?Assessment/Plan: ?Thrombocytopenia ?Bone marrow biopsy 07/08/2016-no evidence of B-cell lymphoma, 40% cellular bone marrow with trilineage hematopoiesis, megakaryocytes present with normal morphology, normal cytogenetics, negative for BRAF mutation ?Flow cytometry 01/05/2009-no monoclonal B-cell or phenotypically abnormal T-cell population ?Prednisone starting 06/29/2019 ?Nplate 07/04/2019, 7/84/6962, Nplate 07/18/2019 ?Prednisone 20 mg daily beginning 07/25/2019 ?Platelets 11,000 on 08/15/2019, prednisone increased to 40 mg daily, Nplate resume  9/52/8413  ?Prednisone decreased to 30 mg daily 08/25/2019, Nplate continued ?Prednisone continued at 30 mg daily 09/13/2019, weekly Nplate continued ?Prednisone taper to 20 mg daily 09/23/2019, weekly Nplate continued ?Prednisone taper to 10 mg daily 09/30/2019, weekly Nplate continued ?Prednisone taper to 5 mg daily 10/21/2019, weekly Nplate continued ?Prednisone taper to 5 mg every other day for 5 doses then stop 11/11/2019, weekly Nplate continued ?Nplate last given 2/44/0102 ?Promacta cost prohibitive ?Nplate beginning 09/01/5364, held 08/27/2020 and 09/03/2020 due to patient vacation ?Nplate resumed 4/40/3474 ?Nplate held 04/07/9561 secondary to an elevated platelet count ?Nplate resumed with dose reduction to 1 mcg/kg 10/09/2020 ?Nplate dose decreased to 0.5 mcg/kg 10/29/2020 ?Nplate dose increased to 1 mcg/kg 11/07/2020 ?Hairy cell leukemia 1982, status post splenectomy ?Coronary artery disease ?Aortic stenosis ?Liposarcoma at the right chest wall resected in 2013 ?Macular degeneration ?Hearing loss ?Basal cell carcinoma left cheek 05/24/2019 ?Left upper lobe nodule, faint FDG activity on PET at [facility] 05/31/2013 ?Status post TAVR procedure 09/06/2019 ?Admission with Pseudomonas urosepsis 09/16/2019 ?Acute left MCA distribution CVA 09/16/2019 ?Covid January 2022 ?Mild anemia, 1 of 3 stool Hemoccult cards positive for occult blood 10/14/2020, ferritin low 06/29/2020 ?  ? ?Disposition: ?[name] appears stable.  He will continue weekly Nplate with the goal platelet count greater than 50,000. ? ?He has a history of iron deficiency anemia with a Hemoccult positive stool.  He was referred for a virtual colonoscopy last fall.  This has not been scheduled.  We will check into this. ? ?He will return for an office visit in 4  weeks. ? ?Betsy Coder, MD ? ?05/06/2021  ?9:27 AM ? ? ?

## 2021-05-06 NOTE — Patient Instructions (Signed)
Romiplostim injection ?What is this medication? ?ROMIPLOSTIM (roe mi PLOE stim) helps your body make more platelets. This medicine is used to treat low platelets caused by chronic idiopathic thrombocytopenic purpura (ITP) or a bone marrow syndrome caused by radiation sickness. ?This medicine may be used for other purposes; ask your health care provider or pharmacist if you have questions. ?COMMON BRAND NAME(S): Nplate ?What should I tell my care team before I take this medication? ?They need to know if you have any of these conditions: ?blood clots ?myelodysplastic syndrome ?an unusual or allergic reaction to romiplostim, mannitol, other medicines, foods, dyes, or preservatives ?pregnant or trying to get pregnant ?breast-feeding ?How should I use this medication? ?This medicine is injected under the skin. It is given by a health care provider in a hospital or clinic setting. ?A special MedGuide will be given to you before each treatment. Be sure to read this information carefully each time. ?Talk to your health care provider about the use of this medicine in children. While it may be prescribed for children as young as newborns for selected conditions, precautions do apply. ?Overdosage: If you think you have taken too much of this medicine contact a poison control center or emergency room at once. ?NOTE: This medicine is only for you. Do not share this medicine with others. ?What if I miss a dose? ?Keep appointments for follow-up doses. It is important not to miss your dose. Call your health care provider if you are unable to keep an appointment. ?What may interact with this medication? ?Interactions are not expected. ?This list may not describe all possible interactions. Give your health care provider a list of all the medicines, herbs, non-prescription drugs, or dietary supplements you use. Also tell them if you smoke, drink alcohol, or use illegal drugs. Some items may interact with your medicine. ?What should I  watch for while using this medication? ?Visit your health care provider for regular checks on your progress. You may need blood work done while you are taking this medicine. Your condition will be monitored carefully while you are receiving this medicine. It is important not to miss any appointments. ?What side effects may I notice from receiving this medication? ?Side effects that you should report to your doctor or health care professional as soon as possible: ?allergic reactions (skin rash, itching or hives; swelling of the face, lips, or tongue) ?bleeding (bloody or black, tarry stools; red or dark brown urine; spitting up blood or brown material that looks like coffee grounds; red spots on the skin; unusual bruising or bleeding from the eyes, gums, or nose) ?blood clot (chest pain; shortness of breath; pain, swelling, or warmth in the leg) ?stroke (changes in vision; confusion; trouble speaking or understanding; severe headaches; sudden numbness or weakness of the face, arm or leg; trouble walking; dizziness; loss of balance or coordination) ?Side effects that usually do not require medical attention (report to your doctor or health care professional if they continue or are bothersome): ?diarrhea ?dizziness ?headache ?joint pain ?muscle pain ?stomach pain ?trouble sleeping ?This list may not describe all possible side effects. Call your doctor for medical advice about side effects. You may report side effects to FDA at 1-800-FDA-1088. ?Where should I keep my medication? ?This medicine is given in a hospital or clinic. It will not be stored at home. ?NOTE: This sheet is a summary. It may not cover all possible information. If you have questions about this medicine, talk to your doctor, pharmacist, or health care provider. ??   2022 Elsevier/Gold Standard (2020-11-06 00:00:00) ? ?

## 2021-05-09 ENCOUNTER — Encounter: Payer: Self-pay | Admitting: *Deleted

## 2021-05-09 ENCOUNTER — Other Ambulatory Visit: Payer: Self-pay | Admitting: *Deleted

## 2021-05-09 ENCOUNTER — Telehealth: Payer: Self-pay | Admitting: *Deleted

## 2021-05-09 DIAGNOSIS — D693 Immune thrombocytopenic purpura: Secondary | ICD-10-CM

## 2021-05-09 NOTE — Telephone Encounter (Signed)
Mrs. [name] called back to report [name] tested positive for covid today on a home test. Has sore throat, sinus congestion and cough. They have reached out to PCP and they called in anti-viral med for him. ?She reports they are not going to [place]--just to their beach house around week of Easter. She will find [name] there to have his injections and will send contact information via MyChart. Virtual colonoscopy can be late April or early May. ?

## 2021-05-13 ENCOUNTER — Other Ambulatory Visit: Payer: Self-pay

## 2021-05-13 ENCOUNTER — Inpatient Hospital Stay: Payer: Medicare Other

## 2021-05-13 ENCOUNTER — Telehealth: Payer: Self-pay | Admitting: *Deleted

## 2021-05-13 ENCOUNTER — Other Ambulatory Visit: Payer: Self-pay | Admitting: *Deleted

## 2021-05-13 VITALS — BP 122/66 | HR 61 | Temp 98.1°F | Resp 18 | Ht 64.0 in

## 2021-05-13 DIAGNOSIS — D693 Immune thrombocytopenic purpura: Secondary | ICD-10-CM

## 2021-05-13 DIAGNOSIS — D696 Thrombocytopenia, unspecified: Secondary | ICD-10-CM | POA: Diagnosis not present

## 2021-05-13 LAB — CBC WITH DIFFERENTIAL (CANCER CENTER ONLY)
Abs Immature Granulocytes: 0.21 10*3/uL — ABNORMAL HIGH (ref 0.00–0.07)
Basophils Absolute: 0.1 10*3/uL (ref 0.0–0.1)
Basophils Relative: 1 %
Eosinophils Absolute: 0.2 10*3/uL (ref 0.0–0.5)
Eosinophils Relative: 3 %
HCT: 37.4 % — ABNORMAL LOW (ref 39.0–52.0)
Hemoglobin: 11.8 g/dL — ABNORMAL LOW (ref 13.0–17.0)
Immature Granulocytes: 3 %
Lymphocytes Relative: 21 %
Lymphs Abs: 1.5 10*3/uL (ref 0.7–4.0)
MCH: 27.1 pg (ref 26.0–34.0)
MCHC: 31.6 g/dL (ref 30.0–36.0)
MCV: 85.8 fL (ref 80.0–100.0)
Monocytes Absolute: 1.3 10*3/uL — ABNORMAL HIGH (ref 0.1–1.0)
Monocytes Relative: 18 %
Neutro Abs: 3.9 10*3/uL (ref 1.7–7.7)
Neutrophils Relative %: 54 %
Platelet Count: 30 10*3/uL — ABNORMAL LOW (ref 150–400)
RBC: 4.36 MIL/uL (ref 4.22–5.81)
RDW: 16.1 % — ABNORMAL HIGH (ref 11.5–15.5)
WBC Count: 7.2 10*3/uL (ref 4.0–10.5)
nRBC: 0 % (ref 0.0–0.2)

## 2021-05-13 MED ORDER — ROMIPLOSTIM 250 MCG ~~LOC~~ SOLR
2.0000 ug/kg | Freq: Once | SUBCUTANEOUS | Status: AC
  Administered 2021-05-13: 155 ug via SUBCUTANEOUS
  Filled 2021-05-13: qty 0.31

## 2021-05-13 NOTE — Progress Notes (Signed)
Platelets 30 today. MD and Pharmacy notified.  ?

## 2021-05-13 NOTE — Patient Instructions (Signed)
Romiplostim injection ?What is this medication? ?ROMIPLOSTIM (roe mi PLOE stim) helps your body make more platelets. This medicine is used to treat low platelets caused by chronic idiopathic thrombocytopenic purpura (ITP) or a bone marrow syndrome caused by radiation sickness. ?This medicine may be used for other purposes; ask your health care provider or pharmacist if you have questions. ?COMMON BRAND NAME(S): Nplate ?What should I tell my care team before I take this medication? ?They need to know if you have any of these conditions: ?blood clots ?myelodysplastic syndrome ?an unusual or allergic reaction to romiplostim, mannitol, other medicines, foods, dyes, or preservatives ?pregnant or trying to get pregnant ?breast-feeding ?How should I use this medication? ?This medicine is injected under the skin. It is given by a health care provider in a hospital or clinic setting. ?A special MedGuide will be given to you before each treatment. Be sure to read this information carefully each time. ?Talk to your health care provider about the use of this medicine in children. While it may be prescribed for children as young as newborns for selected conditions, precautions do apply. ?Overdosage: If you think you have taken too much of this medicine contact a poison control center or emergency room at once. ?NOTE: This medicine is only for you. Do not share this medicine with others. ?What if I miss a dose? ?Keep appointments for follow-up doses. It is important not to miss your dose. Call your health care provider if you are unable to keep an appointment. ?What may interact with this medication? ?Interactions are not expected. ?This list may not describe all possible interactions. Give your health care provider a list of all the medicines, herbs, non-prescription drugs, or dietary supplements you use. Also tell them if you smoke, drink alcohol, or use illegal drugs. Some items may interact with your medicine. ?What should I  watch for while using this medication? ?Visit your health care provider for regular checks on your progress. You may need blood work done while you are taking this medicine. Your condition will be monitored carefully while you are receiving this medicine. It is important not to miss any appointments. ?What side effects may I notice from receiving this medication? ?Side effects that you should report to your doctor or health care professional as soon as possible: ?allergic reactions (skin rash, itching or hives; swelling of the face, lips, or tongue) ?bleeding (bloody or black, tarry stools; red or dark brown urine; spitting up blood or brown material that looks like coffee grounds; red spots on the skin; unusual bruising or bleeding from the eyes, gums, or nose) ?blood clot (chest pain; shortness of breath; pain, swelling, or warmth in the leg) ?stroke (changes in vision; confusion; trouble speaking or understanding; severe headaches; sudden numbness or weakness of the face, arm or leg; trouble walking; dizziness; loss of balance or coordination) ?Side effects that usually do not require medical attention (report to your doctor or health care professional if they continue or are bothersome): ?diarrhea ?dizziness ?headache ?joint pain ?muscle pain ?stomach pain ?trouble sleeping ?This list may not describe all possible side effects. Call your doctor for medical advice about side effects. You may report side effects to FDA at 1-800-FDA-1088. ?Where should I keep my medication? ?This medicine is given in a hospital or clinic. It will not be stored at home. ?NOTE: This sheet is a summary. It may not cover all possible information. If you have questions about this medicine, talk to your doctor, pharmacist, or health care provider. ??   2022 Elsevier/Gold Standard (2020-11-06 00:00:00) ? ?

## 2021-05-13 NOTE — Telephone Encounter (Signed)
Left VM w/referral coordinator at [facility] to send records for referral. Patient planning time in area for few weeks in April and will need hematology f/u there with weekly Nplate. ?

## 2021-05-16 DIAGNOSIS — E785 Hyperlipidemia, unspecified: Secondary | ICD-10-CM | POA: Diagnosis not present

## 2021-05-16 DIAGNOSIS — Z125 Encounter for screening for malignant neoplasm of prostate: Secondary | ICD-10-CM | POA: Diagnosis not present

## 2021-05-16 DIAGNOSIS — I10 Essential (primary) hypertension: Secondary | ICD-10-CM | POA: Diagnosis not present

## 2021-05-17 ENCOUNTER — Encounter: Payer: Self-pay | Admitting: *Deleted

## 2021-05-17 NOTE — Progress Notes (Signed)
Referral order, demographics and chart records faxed to [facility] [phone]. ?Needs appointment for April due to vacation in the area.  ?Phone [phone] for Dr. Randie Heinz or Dr. Wanda Plump ?

## 2021-05-20 ENCOUNTER — Inpatient Hospital Stay: Payer: Medicare Other

## 2021-05-20 ENCOUNTER — Other Ambulatory Visit: Payer: Self-pay

## 2021-05-20 ENCOUNTER — Encounter: Payer: Self-pay | Admitting: Oncology

## 2021-05-20 VITALS — BP 128/69 | HR 60 | Temp 98.2°F | Resp 20 | Ht 64.0 in | Wt 163.4 lb

## 2021-05-20 DIAGNOSIS — D693 Immune thrombocytopenic purpura: Secondary | ICD-10-CM

## 2021-05-20 DIAGNOSIS — D696 Thrombocytopenia, unspecified: Secondary | ICD-10-CM | POA: Diagnosis not present

## 2021-05-20 LAB — CBC WITH DIFFERENTIAL (CANCER CENTER ONLY)
Abs Immature Granulocytes: 0.49 10*3/uL — ABNORMAL HIGH (ref 0.00–0.07)
Basophils Absolute: 0.1 10*3/uL (ref 0.0–0.1)
Basophils Relative: 1 %
Eosinophils Absolute: 0.1 10*3/uL (ref 0.0–0.5)
Eosinophils Relative: 1 %
HCT: 38.1 % — ABNORMAL LOW (ref 39.0–52.0)
Hemoglobin: 11.9 g/dL — ABNORMAL LOW (ref 13.0–17.0)
Immature Granulocytes: 4 %
Lymphocytes Relative: 11 %
Lymphs Abs: 1.4 10*3/uL (ref 0.7–4.0)
MCH: 26.6 pg (ref 26.0–34.0)
MCHC: 31.2 g/dL (ref 30.0–36.0)
MCV: 85.2 fL (ref 80.0–100.0)
Monocytes Absolute: 2.9 10*3/uL — ABNORMAL HIGH (ref 0.1–1.0)
Monocytes Relative: 22 %
Neutro Abs: 8.2 10*3/uL — ABNORMAL HIGH (ref 1.7–7.7)
Neutrophils Relative %: 61 %
Platelet Count: 142 10*3/uL — ABNORMAL LOW (ref 150–400)
RBC: 4.47 MIL/uL (ref 4.22–5.81)
RDW: 15.8 % — ABNORMAL HIGH (ref 11.5–15.5)
WBC Count: 13.2 10*3/uL — ABNORMAL HIGH (ref 4.0–10.5)
nRBC: 0 % (ref 0.0–0.2)

## 2021-05-20 MED ORDER — ROMIPLOSTIM 125 MCG ~~LOC~~ SOLR
1.0000 ug/kg | Freq: Once | SUBCUTANEOUS | Status: AC
  Administered 2021-05-20: 75 ug via SUBCUTANEOUS
  Filled 2021-05-20: qty 0.15

## 2021-05-20 NOTE — Patient Instructions (Signed)
Romiplostim injection ?What is this medication? ?ROMIPLOSTIM (roe mi PLOE stim) helps your body make more platelets. This medicine is used to treat low platelets caused by chronic idiopathic thrombocytopenic purpura (ITP) or a bone marrow syndrome caused by radiation sickness. ?This medicine may be used for other purposes; ask your health care provider or pharmacist if you have questions. ?COMMON BRAND NAME(S): Nplate ?What should I tell my care team before I take this medication? ?They need to know if you have any of these conditions: ?blood clots ?myelodysplastic syndrome ?an unusual or allergic reaction to romiplostim, mannitol, other medicines, foods, dyes, or preservatives ?pregnant or trying to get pregnant ?breast-feeding ?How should I use this medication? ?This medicine is injected under the skin. It is given by a health care provider in a hospital or clinic setting. ?A special MedGuide will be given to you before each treatment. Be sure to read this information carefully each time. ?Talk to your health care provider about the use of this medicine in children. While it may be prescribed for children as young as newborns for selected conditions, precautions do apply. ?Overdosage: If you think you have taken too much of this medicine contact a poison control center or emergency room at once. ?NOTE: This medicine is only for you. Do not share this medicine with others. ?What if I miss a dose? ?Keep appointments for follow-up doses. It is important not to miss your dose. Call your health care provider if you are unable to keep an appointment. ?What may interact with this medication? ?Interactions are not expected. ?This list may not describe all possible interactions. Give your health care provider a list of all the medicines, herbs, non-prescription drugs, or dietary supplements you use. Also tell them if you smoke, drink alcohol, or use illegal drugs. Some items may interact with your medicine. ?What should I  watch for while using this medication? ?Visit your health care provider for regular checks on your progress. You may need blood work done while you are taking this medicine. Your condition will be monitored carefully while you are receiving this medicine. It is important not to miss any appointments. ?What side effects may I notice from receiving this medication? ?Side effects that you should report to your doctor or health care professional as soon as possible: ?allergic reactions (skin rash, itching or hives; swelling of the face, lips, or tongue) ?bleeding (bloody or black, tarry stools; red or dark brown urine; spitting up blood or brown material that looks like coffee grounds; red spots on the skin; unusual bruising or bleeding from the eyes, gums, or nose) ?blood clot (chest pain; shortness of breath; pain, swelling, or warmth in the leg) ?stroke (changes in vision; confusion; trouble speaking or understanding; severe headaches; sudden numbness or weakness of the face, arm or leg; trouble walking; dizziness; loss of balance or coordination) ?Side effects that usually do not require medical attention (report to your doctor or health care professional if they continue or are bothersome): ?diarrhea ?dizziness ?headache ?joint pain ?muscle pain ?stomach pain ?trouble sleeping ?This list may not describe all possible side effects. Call your doctor for medical advice about side effects. You may report side effects to FDA at 1-800-FDA-1088. ?Where should I keep my medication? ?This medicine is given in a hospital or clinic. It will not be stored at home. ?NOTE: This sheet is a summary. It may not cover all possible information. If you have questions about this medicine, talk to your doctor, pharmacist, or health care provider. ??   2022 Elsevier/Gold Standard (2020-11-06 00:00:00) ? ?

## 2021-05-20 NOTE — Telephone Encounter (Signed)
On 05/17/21 Faxed referral order, demographics and chart information to [facility] at [phone] with confirmation of receipt. ?

## 2021-05-21 DIAGNOSIS — H401431 Capsular glaucoma with pseudoexfoliation of lens, bilateral, mild stage: Secondary | ICD-10-CM | POA: Diagnosis not present

## 2021-05-21 DIAGNOSIS — L570 Actinic keratosis: Secondary | ICD-10-CM | POA: Diagnosis not present

## 2021-05-21 DIAGNOSIS — Z85828 Personal history of other malignant neoplasm of skin: Secondary | ICD-10-CM | POA: Diagnosis not present

## 2021-05-21 DIAGNOSIS — D2260 Melanocytic nevi of unspecified upper limb, including shoulder: Secondary | ICD-10-CM | POA: Diagnosis not present

## 2021-05-21 DIAGNOSIS — L821 Other seborrheic keratosis: Secondary | ICD-10-CM | POA: Diagnosis not present

## 2021-05-21 DIAGNOSIS — D225 Melanocytic nevi of trunk: Secondary | ICD-10-CM | POA: Diagnosis not present

## 2021-05-21 DIAGNOSIS — D2270 Melanocytic nevi of unspecified lower limb, including hip: Secondary | ICD-10-CM | POA: Diagnosis not present

## 2021-05-21 DIAGNOSIS — L218 Other seborrheic dermatitis: Secondary | ICD-10-CM | POA: Diagnosis not present

## 2021-05-21 NOTE — Progress Notes (Signed)
?Cardiology Office Note:   ? ?Date:  05/22/2021  ? ?ID:  [name], DOB 05/06/1923, MRN [id number] ? ?PCP:  [name], MD  ?Cardiologist:  Sinclair Grooms, MD  ? ?Referring MD: [name], MD  ? ?No chief complaint on file. ? ? ?History of Present Illness:   ? ?[name] is a 88 y.o. male with a hx of idiopathic thrombocytopenia (Rx?d with Nplate and steroids), bifasicular block (RBBB, LAFB), anemia, anxiety, CAD, colon cancer, HLD, GERD, hairy cell leukemia, PAF found on monitor, HTN and severe aortic stenosis s/p TAVR (09/06/19), high-grade AV block that resolved without pacemaker, CVA, followed by a readmission for urosepsis/bacteremia.  ? ? ? ?He is doing okay.  His major complaint is dyspnea on exertion.  He has not had syncope, orthopnea, PND, lower extremity swelling, angina, or palpitations. ? ?He had many complaints and concerns that he may be aware of.  Please see review of systems. ? ?Past Medical History:  ?Diagnosis Date  ? Acute appendicitis   ? Anemia   ? Anxiety   ? Arthritis   ? "back" (03/14/2014)  ? Blood dyscrasia   ? hairy cell leukemia  ? CAD (coronary artery disease)   ? a. 03/14/14  s/p overlapping DES x2 to mid-distal RCA.  ? 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 Colon cancer ([place]) 1984  ? Compression fracture of lumbar spine, non-traumatic ([place])   ? DJD (degenerative joint disease) of lumbar spine   ? Dyslipidemia   ? Dyspnea   ? Elevated PSA   ? GERD (gastroesophageal reflux disease)   ? Glaucoma   ? Hairy cell leukemia ([employer]) dx'd 1980  ? Heart murmur   ? History of blood transfusion   ? "several; related to hairy cell leukemia & tx "  ? History of stomach ulcers 1968  ? Hypertension   ? Leukemia, hairy cell ([place])   ? Malnutrition ([place])   ? Osteoarthritis   ? Osteoporosis   ? Pneumonia   ? S/P TAVR (transcatheter aortic valve replacement) 09/06/2019  ? s/p TAVR with a 26 mm Edwards S3U via the TF approach by Dr. Angelena Form and Cyndia Bent.   ? Severe  aortic stenosis   ? s/p tavr  ? Skin cancer of face   ? ? ?Past Surgical History:  ?Procedure Laterality Date  ? APPENDECTOMY  10/2007  ? BUBBLE STUDY  11/02/2019  ? Procedure: BUBBLE STUDY;  Surgeon: Elouise Munroe, MD;  Location: [facility] ENDOSCOPY;  Service: Cardiology;;  ? CATARACT EXTRACTION, BILATERAL Bilateral 02/2008  ? COLON SURGERY  1984  ? "sigmoid; open"  ? CORONARY ANGIOPLASTY WITH STENT PLACEMENT  03/14/2014  ? "2"  ? EYE SURGERY Bilateral   ? cataract  ? FRACTURE SURGERY    ? LEFT HEART CATHETERIZATION WITH CORONARY ANGIOGRAM N/A 03/14/2014  ? Procedure: LEFT HEART CATHETERIZATION WITH CORONARY ANGIOGRAM;  Surgeon: Peter M Martinique, MD;  Location: [facility] CATH LAB;  Service: Cardiovascular;  Laterality: N/A;  ? LIPOMA EXCISION Right 07/2011  ? liposarcoma resection; "back"  ? MOHS SURGERY Left 02/2011  ? MOHS SURGERY  X 3  ? "all on my face"  ? MOLE REMOVAL Left 1985  ? cheek  ? ORIF ANKLE FRACTURE Left 2000  ? RIGHT/LEFT HEART CATH AND CORONARY ANGIOGRAPHY N/A 07/26/2019  ? Procedure: RIGHT/LEFT HEART CATH AND CORONARY ANGIOGRAPHY;  Surgeon: Belva Crome, MD;  Location: [place] CV LAB;  Service: Cardiovascular;  Laterality: N/A;  ?  SHOULDER SURGERY  04/2004  ? SPLENECTOMY  1992  ? TEE WITHOUT CARDIOVERSION N/A 09/06/2019  ? Procedure: TRANSESOPHAGEAL ECHOCARDIOGRAM (TEE);  Surgeon: Burnell Blanks, MD;  Location: [place] CV LAB;  Service: Open Heart Surgery;  Laterality: N/A;  ? TEE WITHOUT CARDIOVERSION N/A 11/02/2019  ? Procedure: TRANSESOPHAGEAL ECHOCARDIOGRAM (TEE);  Surgeon: Elouise Munroe, MD;  Location: [place];  Service: Cardiology;  Laterality: N/A;  ? TONSILLECTOMY AND ADENOIDECTOMY  1939  ? TRANSCATHETER AORTIC VALVE REPLACEMENT, TRANSFEMORAL N/A 09/06/2019  ? Procedure: TRANSCATHETER AORTIC VALVE REPLACEMENT, TRANSFEMORAL;  Surgeon: Burnell Blanks, MD;  Location: [place] CV LAB;  Service: Open Heart Surgery;  Laterality: N/A;  ? ? ?Current Medications: ?Current Meds   ?Medication Sig  ? acetaminophen (TYLENOL) 500 MG tablet Take 1,000 mg by mouth every 8 (eight) hours as needed for moderate pain.  ? amoxicillin (AMOXIL) 500 MG tablet Take 4 capsules (2,000 mg) one hour prior to all dental visits.  ? atorvastatin (LIPITOR) 20 MG tablet TAKE 1 TABLET BY MOUTH DAILY AT 6 PM  ? Cholecalciferol (VITAMIN D3) 50 MCG (2000 UT) TABS Take 2,000 Units by mouth every evening.  ? dapagliflozin propanediol (FARXIGA) 10 MG TABS tablet Take 1 tablet (10 mg total) by mouth daily before breakfast.  ? desonide (DESOWEN) 0.05 % cream Apply 1 application. topically 2 (two) times daily as needed (rash/irritation.). Only takes as needed  ? dexamethasone (DECADRON) 0.1 % ophthalmic solution Place 1 drop into both eyes.  ? finasteride (PROSCAR) 5 MG tablet Take 5 mg by mouth daily.  ? fluocinonide (LIDEX) 0.05 % external solution Apply 1 application topically 2 (two) times daily as needed (apply to ears).   ? latanoprost (XALATAN) 0.005 % ophthalmic solution Place 1 drop into both eyes at bedtime.   ? metoprolol tartrate (LOPRESSOR) 25 MG tablet Take 0.5 tablets (12.5 mg total) by mouth daily.  ? Multiple Vitamins-Minerals (PRESERVISION AREDS) CAPS 1 tablet 2 (two) times daily.  ? mupirocin ointment (BACTROBAN) 2 %   ? nitroGLYCERIN (NITROSTAT) 0.4 MG SL tablet PLACE AND DISSOLVE 1 TABLET UNDER THE TONGUE EVERY 5 MINUTES AS NEEDED FOR CHEST PAIN FOR UP TO 3 DOSES, CALL 911 IF NO RELIEF AFTER 1ST DOSE  ? pantoprazole (PROTONIX) 40 MG tablet Take 1 tablet (40 mg total) by mouth daily.  ? PAXLOVID, 150/100, 10 x 150 MG & 10 x '100MG'$  TBPK Take by mouth.  ? ranibizumab (LUCENTIS) 0.5 MG/0.05ML SOLN 0.5 mg by Intravitreal route every 6 (six) weeks. Left Eye ONLY  ? romiPLOStim (NPLATE) 250 MCG injection Inject into the skin once a week.  ? sertraline (ZOLOFT) 50 MG tablet Take 50 mg by mouth every evening.   ? tamsulosin (FLOMAX) 0.4 MG CAPS capsule Take 0.8 mg by mouth daily after supper.   ? timolol  (BETIMOL) 0.5 % ophthalmic solution Place 1 drop into the right eye daily.   ? timolol (TIMOPTIC) 0.5 % ophthalmic solution Place 1 drop into the right eye daily.  ?  ? ?Allergies:   Antazoline; Anesthetics, amide; Antihistamines, chlorpheniramine-type; Calcitonin (salmon); and Sulfa antibiotics  ? ?Social History  ? ?Socioeconomic History  ? Marital status: Married  ?  Spouse name: Not on file  ? Number of children: Not on file  ? Years of education: 15  ? Highest education level: Bachelor's degree (e.g., BA, AB, BS)  ?Occupational History  ? Occupation: Retired  ?Tobacco Use  ? Smoking status: Former  ?  Packs/day: 0.25  ?  Years:  1.00  ?  Pack years: 0.25  ?  Types: Cigarettes, Cigars  ? Smokeless tobacco: Never  ? Tobacco comments:  ?  occasional social smoker during college.  ?Vaping Use  ? Vaping Use: Never used  ?Substance and Sexual Activity  ? Alcohol use: Yes  ?  Alcohol/week: 9.0 standard drinks  ?  Types: 2 Glasses of wine, 2 Shots of liquor, 5 Standard drinks or equivalent per week  ?  Comment: socially  ? Drug use: No  ? Sexual activity: Not Currently  ?Other Topics Concern  ? Not on file  ?Social History Narrative  ? Not on file  ? ?Social Determinants of Health  ? ?Financial Resource Strain: Not on file  ?Food Insecurity: Not on file  ?Transportation Needs: Not on file  ?Physical Activity: Not on file  ?Stress: Not on file  ?Social Connections: Not on file  ?  ? ?Family History: ?The patient's family history includes Congestive Heart Failure (age of onset: 32) in his father; Diabetes Mellitus II in his brother; Heart Problems in his brother; Prostate cancer in his brother; Stroke in his mother. ? ?ROS:   ?Please see the history of present illness.    ?If he lays on his right side, a focal area of discomfort occurs in the mid left sternal region.  His back hurts all the time.  His feet feel numb at night.  He has some difficulty with his memory and word finding.  He has some difficulty with balance.   He had COVID-19 3 weeks ago and was on Paxlovid.  All other systems reviewed and are negative. ? ?EKGs/Labs/Other Studies Reviewed:   ? ?The following studies were reviewed today: ?2D Doppler echocardiogram performed

## 2021-05-22 ENCOUNTER — Ambulatory Visit (INDEPENDENT_AMBULATORY_CARE_PROVIDER_SITE_OTHER): Payer: Medicare Other | Admitting: Interventional Cardiology

## 2021-05-22 ENCOUNTER — Other Ambulatory Visit: Payer: Self-pay

## 2021-05-22 ENCOUNTER — Encounter: Payer: Self-pay | Admitting: Interventional Cardiology

## 2021-05-22 VITALS — BP 122/68 | HR 58 | Ht 65.0 in | Wt 164.0 lb

## 2021-05-22 DIAGNOSIS — Z952 Presence of prosthetic heart valve: Secondary | ICD-10-CM

## 2021-05-22 DIAGNOSIS — I209 Angina pectoris, unspecified: Secondary | ICD-10-CM | POA: Diagnosis not present

## 2021-05-22 DIAGNOSIS — I10 Essential (primary) hypertension: Secondary | ICD-10-CM | POA: Diagnosis not present

## 2021-05-22 DIAGNOSIS — E785 Hyperlipidemia, unspecified: Secondary | ICD-10-CM | POA: Diagnosis not present

## 2021-05-22 DIAGNOSIS — I480 Paroxysmal atrial fibrillation: Secondary | ICD-10-CM

## 2021-05-22 DIAGNOSIS — I5032 Chronic diastolic (congestive) heart failure: Secondary | ICD-10-CM

## 2021-05-22 MED ORDER — DAPAGLIFLOZIN PROPANEDIOL 10 MG PO TABS: 10.0000 mg | ORAL_TABLET | Freq: Every day | ORAL | 11 refills | Status: DC

## 2021-05-22 NOTE — Patient Instructions (Signed)
Medication Instructions:  ?1) START Farxiga '10mg'$  once daily ? ?*If you need a refill on your cardiac medications before your next appointment, please call your pharmacy* ? ? ?Lab Work: ?BMET in 1 month. ? ?If you have labs (blood work) drawn today and your tests are completely normal, you will receive your results only by: ?MyChart Message (if you have MyChart) OR ?A paper copy in the mail ?If you have any lab test that is abnormal or we need to change your treatment, we will call you to review the results. ? ? ?Testing/Procedures: ?None ? ? ?Follow-Up: ?At [facility], you and your health needs are our priority.  As part of our continuing mission to provide you with exceptional heart care, we have created designated Provider Care Teams.  These Care Teams include your primary Cardiologist (physician) and Advanced Practice Providers (APPs -  Physician Assistants and Nurse Practitioners) who all work together to provide you with the care you need, when you need it. ? ?We recommend signing up for the patient portal called "MyChart".  Sign up information is provided on this After Visit Summary.  MyChart is used to connect with patients for Virtual Visits (Telemedicine).  Patients are able to view lab/test results, encounter notes, upcoming appointments, etc.  Non-urgent messages can be sent to your provider as well.   ?To learn more about what you can do with MyChart, go to [email or web address].   ? ?Your next appointment:   ?1 year(s) ? ?The format for your next appointment:   ?In Person ? ?Provider:   ?Sinclair Grooms, MD  ? ? ?Other Instructions ?  ?

## 2021-05-23 DIAGNOSIS — Z0000 Encounter for general adult medical examination without abnormal findings: Secondary | ICD-10-CM | POA: Diagnosis not present

## 2021-05-23 DIAGNOSIS — R748 Abnormal levels of other serum enzymes: Secondary | ICD-10-CM | POA: Diagnosis not present

## 2021-05-23 DIAGNOSIS — I10 Essential (primary) hypertension: Secondary | ICD-10-CM | POA: Diagnosis not present

## 2021-05-23 DIAGNOSIS — R82998 Other abnormal findings in urine: Secondary | ICD-10-CM | POA: Diagnosis not present

## 2021-05-23 DIAGNOSIS — Z1331 Encounter for screening for depression: Secondary | ICD-10-CM | POA: Diagnosis not present

## 2021-05-23 DIAGNOSIS — E785 Hyperlipidemia, unspecified: Secondary | ICD-10-CM | POA: Diagnosis not present

## 2021-05-23 DIAGNOSIS — R972 Elevated prostate specific antigen [PSA]: Secondary | ICD-10-CM | POA: Diagnosis not present

## 2021-05-23 DIAGNOSIS — I480 Paroxysmal atrial fibrillation: Secondary | ICD-10-CM | POA: Diagnosis not present

## 2021-05-23 DIAGNOSIS — R2681 Unsteadiness on feet: Secondary | ICD-10-CM | POA: Diagnosis not present

## 2021-05-23 DIAGNOSIS — D696 Thrombocytopenia, unspecified: Secondary | ICD-10-CM | POA: Diagnosis not present

## 2021-05-23 DIAGNOSIS — I2510 Atherosclerotic heart disease of native coronary artery without angina pectoris: Secondary | ICD-10-CM | POA: Diagnosis not present

## 2021-05-23 DIAGNOSIS — F5104 Psychophysiologic insomnia: Secondary | ICD-10-CM | POA: Diagnosis not present

## 2021-05-23 DIAGNOSIS — Z1339 Encounter for screening examination for other mental health and behavioral disorders: Secondary | ICD-10-CM | POA: Diagnosis not present

## 2021-05-23 DIAGNOSIS — M5136 Other intervertebral disc degeneration, lumbar region: Secondary | ICD-10-CM | POA: Diagnosis not present

## 2021-05-23 DIAGNOSIS — Z8673 Personal history of transient ischemic attack (TIA), and cerebral infarction without residual deficits: Secondary | ICD-10-CM | POA: Diagnosis not present

## 2021-05-24 ENCOUNTER — Other Ambulatory Visit: Payer: Self-pay | Admitting: *Deleted

## 2021-05-24 MED ORDER — ATORVASTATIN CALCIUM 20 MG PO TABS
ORAL_TABLET | ORAL | 3 refills | Status: DC
Start: 2021-05-24 — End: 2022-05-09

## 2021-05-27 ENCOUNTER — Other Ambulatory Visit: Payer: Self-pay

## 2021-05-27 ENCOUNTER — Inpatient Hospital Stay: Payer: Medicare Other

## 2021-05-27 VITALS — BP 135/62 | HR 54 | Temp 98.4°F | Resp 20 | Ht 65.0 in | Wt 165.4 lb

## 2021-05-27 DIAGNOSIS — D693 Immune thrombocytopenic purpura: Secondary | ICD-10-CM

## 2021-05-27 DIAGNOSIS — D696 Thrombocytopenia, unspecified: Secondary | ICD-10-CM | POA: Diagnosis not present

## 2021-05-27 LAB — CBC WITH DIFFERENTIAL (CANCER CENTER ONLY)
Abs Immature Granulocytes: 0.28 10*3/uL — ABNORMAL HIGH (ref 0.00–0.07)
Basophils Absolute: 0.1 10*3/uL (ref 0.0–0.1)
Basophils Relative: 2 %
Eosinophils Absolute: 0.2 10*3/uL (ref 0.0–0.5)
Eosinophils Relative: 2 %
HCT: 35 % — ABNORMAL LOW (ref 39.0–52.0)
Hemoglobin: 11.1 g/dL — ABNORMAL LOW (ref 13.0–17.0)
Immature Granulocytes: 4 %
Lymphocytes Relative: 16 %
Lymphs Abs: 1.2 10*3/uL (ref 0.7–4.0)
MCH: 26.7 pg (ref 26.0–34.0)
MCHC: 31.7 g/dL (ref 30.0–36.0)
MCV: 84.3 fL (ref 80.0–100.0)
Monocytes Absolute: 1.5 10*3/uL — ABNORMAL HIGH (ref 0.1–1.0)
Monocytes Relative: 19 %
Neutro Abs: 4.4 10*3/uL (ref 1.7–7.7)
Neutrophils Relative %: 57 %
Platelet Count: 256 10*3/uL (ref 150–400)
RBC: 4.15 MIL/uL — ABNORMAL LOW (ref 4.22–5.81)
RDW: 15.8 % — ABNORMAL HIGH (ref 11.5–15.5)
WBC Count: 7.6 10*3/uL (ref 4.0–10.5)
nRBC: 0 % (ref 0.0–0.2)

## 2021-05-27 MED ORDER — ROMIPLOSTIM 125 MCG ~~LOC~~ SOLR
1.0000 ug/kg | Freq: Once | SUBCUTANEOUS | Status: AC
  Administered 2021-05-27: 75 ug via SUBCUTANEOUS
  Filled 2021-05-27: qty 0.15

## 2021-05-27 NOTE — Patient Instructions (Signed)
Romiplostim injection ?What is this medication? ?ROMIPLOSTIM (roe mi PLOE stim) helps your body make more platelets. This medicine is used to treat low platelets caused by chronic idiopathic thrombocytopenic purpura (ITP) or a bone marrow syndrome caused by radiation sickness. ?This medicine may be used for other purposes; ask your health care provider or pharmacist if you have questions. ?COMMON BRAND NAME(S): Nplate ?What should I tell my care team before I take this medication? ?They need to know if you have any of these conditions: ?blood clots ?myelodysplastic syndrome ?an unusual or allergic reaction to romiplostim, mannitol, other medicines, foods, dyes, or preservatives ?pregnant or trying to get pregnant ?breast-feeding ?How should I use this medication? ?This medicine is injected under the skin. It is given by a health care provider in a hospital or clinic setting. ?A special MedGuide will be given to you before each treatment. Be sure to read this information carefully each time. ?Talk to your health care provider about the use of this medicine in children. While it may be prescribed for children as young as newborns for selected conditions, precautions do apply. ?Overdosage: If you think you have taken too much of this medicine contact a poison control center or emergency room at once. ?NOTE: This medicine is only for you. Do not share this medicine with others. ?What if I miss a dose? ?Keep appointments for follow-up doses. It is important not to miss your dose. Call your health care provider if you are unable to keep an appointment. ?What may interact with this medication? ?Interactions are not expected. ?This list may not describe all possible interactions. Give your health care provider a list of all the medicines, herbs, non-prescription drugs, or dietary supplements you use. Also tell them if you smoke, drink alcohol, or use illegal drugs. Some items may interact with your medicine. ?What should I  watch for while using this medication? ?Visit your health care provider for regular checks on your progress. You may need blood work done while you are taking this medicine. Your condition will be monitored carefully while you are receiving this medicine. It is important not to miss any appointments. ?What side effects may I notice from receiving this medication? ?Side effects that you should report to your doctor or health care professional as soon as possible: ?allergic reactions (skin rash, itching or hives; swelling of the face, lips, or tongue) ?bleeding (bloody or black, tarry stools; red or dark brown urine; spitting up blood or brown material that looks like coffee grounds; red spots on the skin; unusual bruising or bleeding from the eyes, gums, or nose) ?blood clot (chest pain; shortness of breath; pain, swelling, or warmth in the leg) ?stroke (changes in vision; confusion; trouble speaking or understanding; severe headaches; sudden numbness or weakness of the face, arm or leg; trouble walking; dizziness; loss of balance or coordination) ?Side effects that usually do not require medical attention (report to your doctor or health care professional if they continue or are bothersome): ?diarrhea ?dizziness ?headache ?joint pain ?muscle pain ?stomach pain ?trouble sleeping ?This list may not describe all possible side effects. Call your doctor for medical advice about side effects. You may report side effects to FDA at 1-800-FDA-1088. ?Where should I keep my medication? ?This medicine is given in a hospital or clinic. It will not be stored at home. ?NOTE: This sheet is a summary. It may not cover all possible information. If you have questions about this medicine, talk to your doctor, pharmacist, or health care provider. ??   2022 Elsevier/Gold Standard (2020-11-06 00:00:00) ? ?

## 2021-05-28 DIAGNOSIS — H353221 Exudative age-related macular degeneration, left eye, with active choroidal neovascularization: Secondary | ICD-10-CM | POA: Diagnosis not present

## 2021-06-03 ENCOUNTER — Inpatient Hospital Stay (HOSPITAL_BASED_OUTPATIENT_CLINIC_OR_DEPARTMENT_OTHER): Payer: Medicare Other | Admitting: Nurse Practitioner

## 2021-06-03 ENCOUNTER — Telehealth: Payer: Self-pay | Admitting: *Deleted

## 2021-06-03 ENCOUNTER — Inpatient Hospital Stay: Payer: Medicare Other

## 2021-06-03 ENCOUNTER — Encounter: Payer: Self-pay | Admitting: Nurse Practitioner

## 2021-06-03 ENCOUNTER — Inpatient Hospital Stay: Payer: Medicare Other | Attending: Nurse Practitioner

## 2021-06-03 ENCOUNTER — Inpatient Hospital Stay: Payer: Medicare Other | Admitting: Nurse Practitioner

## 2021-06-03 VITALS — BP 150/83 | HR 60 | Temp 97.9°F | Resp 18 | Ht 65.0 in | Wt 165.2 lb

## 2021-06-03 DIAGNOSIS — D693 Immune thrombocytopenic purpura: Secondary | ICD-10-CM | POA: Diagnosis not present

## 2021-06-03 DIAGNOSIS — D696 Thrombocytopenia, unspecified: Secondary | ICD-10-CM | POA: Insufficient documentation

## 2021-06-03 LAB — CBC WITH DIFFERENTIAL (CANCER CENTER ONLY)
Abs Immature Granulocytes: 0.37 10*3/uL — ABNORMAL HIGH (ref 0.00–0.07)
Basophils Absolute: 0.1 10*3/uL (ref 0.0–0.1)
Basophils Relative: 1 %
Eosinophils Absolute: 0.2 10*3/uL (ref 0.0–0.5)
Eosinophils Relative: 2 %
HCT: 36.9 % — ABNORMAL LOW (ref 39.0–52.0)
Hemoglobin: 11.6 g/dL — ABNORMAL LOW (ref 13.0–17.0)
Immature Granulocytes: 4 %
Lymphocytes Relative: 14 %
Lymphs Abs: 1.4 10*3/uL (ref 0.7–4.0)
MCH: 26.4 pg (ref 26.0–34.0)
MCHC: 31.4 g/dL (ref 30.0–36.0)
MCV: 84.1 fL (ref 80.0–100.0)
Monocytes Absolute: 2 10*3/uL — ABNORMAL HIGH (ref 0.1–1.0)
Monocytes Relative: 21 %
Neutro Abs: 5.6 10*3/uL (ref 1.7–7.7)
Neutrophils Relative %: 58 %
Platelet Count: 175 10*3/uL (ref 150–400)
RBC: 4.39 MIL/uL (ref 4.22–5.81)
RDW: 16.2 % — ABNORMAL HIGH (ref 11.5–15.5)
WBC Count: 9.7 10*3/uL (ref 4.0–10.5)
nRBC: 0 % (ref 0.0–0.2)

## 2021-06-03 MED ORDER — ROMIPLOSTIM 125 MCG ~~LOC~~ SOLR
1.0000 ug/kg | Freq: Once | SUBCUTANEOUS | Status: AC
  Administered 2021-06-03: 75 ug via SUBCUTANEOUS
  Filled 2021-06-03: qty 0.15

## 2021-06-03 NOTE — Telephone Encounter (Signed)
Called Mr. Kendrix to f/u on him being late for appointment today. He thought his appointment was at 10:00 am. Informed him we will get him scheduled to later in the day. Scheduler will call him. ?

## 2021-06-03 NOTE — Patient Instructions (Signed)
Romiplostim injection ?What is this medication? ?ROMIPLOSTIM (roe mi PLOE stim) helps your body make more platelets. This medicine is used to treat low platelets caused by chronic idiopathic thrombocytopenic purpura (ITP) or a bone marrow syndrome caused by radiation sickness. ?This medicine may be used for other purposes; ask your health care provider or pharmacist if you have questions. ?COMMON BRAND NAME(S): Nplate ?What should I tell my care team before I take this medication? ?They need to know if you have any of these conditions: ?blood clots ?myelodysplastic syndrome ?an unusual or allergic reaction to romiplostim, mannitol, other medicines, foods, dyes, or preservatives ?pregnant or trying to get pregnant ?breast-feeding ?How should I use this medication? ?This medicine is injected under the skin. It is given by a health care provider in a hospital or clinic setting. ?A special MedGuide will be given to you before each treatment. Be sure to read this information carefully each time. ?Talk to your health care provider about the use of this medicine in children. While it may be prescribed for children as young as newborns for selected conditions, precautions do apply. ?Overdosage: If you think you have taken too much of this medicine contact a poison control center or emergency room at once. ?NOTE: This medicine is only for you. Do not share this medicine with others. ?What if I miss a dose? ?Keep appointments for follow-up doses. It is important not to miss your dose. Call your health care provider if you are unable to keep an appointment. ?What may interact with this medication? ?Interactions are not expected. ?This list may not describe all possible interactions. Give your health care provider a list of all the medicines, herbs, non-prescription drugs, or dietary supplements you use. Also tell them if you smoke, drink alcohol, or use illegal drugs. Some items may interact with your medicine. ?What should I  watch for while using this medication? ?Visit your health care provider for regular checks on your progress. You may need blood work done while you are taking this medicine. Your condition will be monitored carefully while you are receiving this medicine. It is important not to miss any appointments. ?What side effects may I notice from receiving this medication? ?Side effects that you should report to your doctor or health care professional as soon as possible: ?allergic reactions (skin rash, itching or hives; swelling of the face, lips, or tongue) ?bleeding (bloody or black, tarry stools; red or dark brown urine; spitting up blood or brown material that looks like coffee grounds; red spots on the skin; unusual bruising or bleeding from the eyes, gums, or nose) ?blood clot (chest pain; shortness of breath; pain, swelling, or warmth in the leg) ?stroke (changes in vision; confusion; trouble speaking or understanding; severe headaches; sudden numbness or weakness of the face, arm or leg; trouble walking; dizziness; loss of balance or coordination) ?Side effects that usually do not require medical attention (report to your doctor or health care professional if they continue or are bothersome): ?diarrhea ?dizziness ?headache ?joint pain ?muscle pain ?stomach pain ?trouble sleeping ?This list may not describe all possible side effects. Call your doctor for medical advice about side effects. You may report side effects to FDA at 1-800-FDA-1088. ?Where should I keep my medication? ?This medicine is given in a hospital or clinic. It will not be stored at home. ?NOTE: This sheet is a summary. It may not cover all possible information. If you have questions about this medicine, talk to your doctor, pharmacist, or health care provider. ??   2022 Elsevier/Gold Standard (2020-11-06 00:00:00) ? ?

## 2021-06-03 NOTE — Telephone Encounter (Signed)
Call to [facility] [phone] asking if patient can be seen there on 4/6 or 4/7 in time for his Nplate that will be due on 06/10/21. He tends to drop platelet count when he misses a dose. Will have to drive 4 hours back to [facility] for injection on 4/10 if they are not able to see him sooner. Requested they call his wife with the new appointment if they can accommodate him. ?

## 2021-06-03 NOTE — Progress Notes (Signed)
?  [place] ?OFFICE PROGRESS NOTE ? ? ?Diagnosis: Thrombocytopenia ? ?INTERVAL HISTORY:  ? ?Mr. Barbary returns as scheduled.  He continues weekly Nplate.  He denies bleeding. ? ?Objective: ? ?Vital signs in last 24 hours: ? ?Blood pressure (!) 150/83, pulse 60, temperature 97.9 ?F (36.6 ?C), temperature source Oral, resp. rate 18, height _0  (1.651 m), weight 165 lb 3.2 oz (74.9 kg), SpO2 98 %. ?  ? ?HEENT: No thrush or ulcers. ?Resp: Lungs clear bilaterally. ?Cardio: Regular rate and rhythm. ?GI: Abdomen soft and nontender.  No hepatosplenomegaly. ?Vascular: Trace lower leg edema bilaterally. ? ?Lab Results: ? ?Lab Results  ?Component Value Date  ? WBC 9.7 06/03/2021  ? HGB 11.6 (L) 06/03/2021  ? HCT 36.9 (L) 06/03/2021  ? MCV 84.1 06/03/2021  ? PLT 175 06/03/2021  ? NEUTROABS 5.6 06/03/2021  ? ? ?Imaging: ? ?No results found. ? ?Medications: I have reviewed the patient's current medications. ? ?Assessment/Plan: ?Thrombocytopenia ?Bone marrow biopsy 07/08/2016-no evidence of B-cell lymphoma, 40% cellular bone marrow with trilineage hematopoiesis, megakaryocytes present with normal morphology, normal cytogenetics, negative for BRAF mutation ?Flow cytometry 01/05/2009-no monoclonal B-cell or phenotypically abnormal T-cell population ?Prednisone starting 06/29/2019 ?Nplate 07/04/2019, 0/05/4915, Nplate 07/18/2019 ?Prednisone 20 mg daily beginning 07/25/2019 ?Platelets 11,000 on 08/15/2019, prednisone increased to 40 mg daily, Nplate resume 11/16/567  ?Prednisone decreased to 30 mg daily 08/25/2019, Nplate continued ?Prednisone continued at 30 mg daily 09/13/2019, weekly Nplate continued ?Prednisone taper to 20 mg daily 09/23/2019, weekly Nplate continued ?Prednisone taper to 10 mg daily 09/30/2019, weekly Nplate continued ?Prednisone taper to 5 mg daily 10/21/2019, weekly Nplate continued ?Prednisone taper to 5 mg every other day for 5 doses then stop 11/11/2019, weekly Nplate continued ?Nplate last given  7/94/8016 ?Promacta cost prohibitive ?Nplate beginning 07/06/3746, held 08/27/2020 and 09/03/2020 due to patient vacation ?Nplate resumed 2/70/7867 ?Nplate held 07/05/4918 secondary to an elevated platelet count ?Nplate resumed with dose reduction to 1 mcg/kg 10/09/2020 ?Nplate dose decreased to 0.5 mcg/kg 10/29/2020 ?Nplate dose increased to 1 mcg/kg 11/07/2020 ?Hairy cell leukemia 1982, status post splenectomy ?Coronary artery disease ?Aortic stenosis ?Liposarcoma at the right chest wall resected in 2013 ?Macular degeneration ?Hearing loss ?Basal cell carcinoma left cheek 05/24/2019 ?Left upper lobe nodule, faint FDG activity on PET at [facility] 05/31/2013 ?Status post TAVR procedure 09/06/2019 ?Admission with Pseudomonas urosepsis 09/16/2019 ?Acute left MCA distribution CVA 09/16/2019 ?Covid January 2022 ?Mild anemia, 1 of 3 stool Hemoccult cards positive for occult blood 10/14/2020, ferritin low 06/29/2020 ? ?Disposition: Mr. Riches remains stable from a hematologic standpoint.  Plan to continue weekly Nplate.  He is leaving to go out of town later this week.  He has an appointment at the [facility] to establish care for continuation of weekly lab and Nplate injections while on vacation.  We will resume lab and weekly Nplate injections when he returns to [place].  We will see him in follow-up in 4 weeks. ? ? ? ?[name] ANP/GNP-BC  ? ?06/03/2021  ?1:50 PM ? ? ? ? ? ? ? ?

## 2021-06-04 ENCOUNTER — Other Ambulatory Visit: Payer: Self-pay | Admitting: *Deleted

## 2021-06-04 DIAGNOSIS — D693 Immune thrombocytopenic purpura: Secondary | ICD-10-CM

## 2021-06-05 DIAGNOSIS — N401 Enlarged prostate with lower urinary tract symptoms: Secondary | ICD-10-CM | POA: Diagnosis not present

## 2021-06-05 DIAGNOSIS — D693 Immune thrombocytopenic purpura: Secondary | ICD-10-CM | POA: Diagnosis not present

## 2021-06-05 DIAGNOSIS — R972 Elevated prostate specific antigen [PSA]: Secondary | ICD-10-CM | POA: Diagnosis not present

## 2021-06-05 DIAGNOSIS — Z79899 Other long term (current) drug therapy: Secondary | ICD-10-CM | POA: Diagnosis not present

## 2021-06-05 DIAGNOSIS — R3912 Poor urinary stream: Secondary | ICD-10-CM | POA: Diagnosis not present

## 2021-06-10 ENCOUNTER — Inpatient Hospital Stay: Payer: Medicare Other

## 2021-06-11 DIAGNOSIS — D693 Immune thrombocytopenic purpura: Secondary | ICD-10-CM | POA: Diagnosis not present

## 2021-06-11 DIAGNOSIS — Z79899 Other long term (current) drug therapy: Secondary | ICD-10-CM | POA: Diagnosis not present

## 2021-06-17 DIAGNOSIS — D693 Immune thrombocytopenic purpura: Secondary | ICD-10-CM | POA: Diagnosis not present

## 2021-06-17 DIAGNOSIS — Z79899 Other long term (current) drug therapy: Secondary | ICD-10-CM | POA: Diagnosis not present

## 2021-06-24 ENCOUNTER — Inpatient Hospital Stay: Payer: Medicare Other

## 2021-06-24 VITALS — BP 139/74 | HR 61 | Temp 98.1°F | Resp 18 | Ht 65.0 in | Wt 161.4 lb

## 2021-06-24 DIAGNOSIS — D696 Thrombocytopenia, unspecified: Secondary | ICD-10-CM | POA: Diagnosis not present

## 2021-06-24 DIAGNOSIS — D693 Immune thrombocytopenic purpura: Secondary | ICD-10-CM

## 2021-06-24 LAB — CBC WITH DIFFERENTIAL (CANCER CENTER ONLY)
Abs Immature Granulocytes: 0.5 10*3/uL — ABNORMAL HIGH (ref 0.00–0.07)
Basophils Absolute: 0.1 10*3/uL (ref 0.0–0.1)
Basophils Relative: 2 %
Eosinophils Absolute: 0.2 10*3/uL (ref 0.0–0.5)
Eosinophils Relative: 2 %
HCT: 37.1 % — ABNORMAL LOW (ref 39.0–52.0)
Hemoglobin: 11.6 g/dL — ABNORMAL LOW (ref 13.0–17.0)
Immature Granulocytes: 5 %
Lymphocytes Relative: 13 %
Lymphs Abs: 1.2 10*3/uL (ref 0.7–4.0)
MCH: 26.2 pg (ref 26.0–34.0)
MCHC: 31.3 g/dL (ref 30.0–36.0)
MCV: 83.7 fL (ref 80.0–100.0)
Monocytes Absolute: 2.1 10*3/uL — ABNORMAL HIGH (ref 0.1–1.0)
Monocytes Relative: 22 %
Neutro Abs: 5.3 10*3/uL (ref 1.7–7.7)
Neutrophils Relative %: 56 %
Platelet Count: 92 10*3/uL — ABNORMAL LOW (ref 150–400)
RBC: 4.43 MIL/uL (ref 4.22–5.81)
RDW: 16.3 % — ABNORMAL HIGH (ref 11.5–15.5)
WBC Count: 9.4 10*3/uL (ref 4.0–10.5)
nRBC: 0 % (ref 0.0–0.2)

## 2021-06-24 MED ORDER — ROMIPLOSTIM 125 MCG ~~LOC~~ SOLR
1.0000 ug/kg | Freq: Once | SUBCUTANEOUS | Status: AC
  Administered 2021-06-24: 75 ug via SUBCUTANEOUS
  Filled 2021-06-24: qty 0.15

## 2021-06-24 NOTE — Patient Instructions (Signed)
Romiplostim injection ?What is this medication? ?ROMIPLOSTIM (roe mi PLOE stim) helps your body make more platelets. This medicine is used to treat low platelets caused by chronic idiopathic thrombocytopenic purpura (ITP) or a bone marrow syndrome caused by radiation sickness. ?This medicine may be used for other purposes; ask your health care provider or pharmacist if you have questions. ?COMMON BRAND NAME(S): Nplate ?What should I tell my care team before I take this medication? ?They need to know if you have any of these conditions: ?blood clots ?myelodysplastic syndrome ?an unusual or allergic reaction to romiplostim, mannitol, other medicines, foods, dyes, or preservatives ?pregnant or trying to get pregnant ?breast-feeding ?How should I use this medication? ?This medicine is injected under the skin. It is given by a health care provider in a hospital or clinic setting. ?A special MedGuide will be given to you before each treatment. Be sure to read this information carefully each time. ?Talk to your health care provider about the use of this medicine in children. While it may be prescribed for children as young as newborns for selected conditions, precautions do apply. ?Overdosage: If you think you have taken too much of this medicine contact a poison control center or emergency room at once. ?NOTE: This medicine is only for you. Do not share this medicine with others. ?What if I miss a dose? ?Keep appointments for follow-up doses. It is important not to miss your dose. Call your health care provider if you are unable to keep an appointment. ?What may interact with this medication? ?Interactions are not expected. ?This list may not describe all possible interactions. Give your health care provider a list of all the medicines, herbs, non-prescription drugs, or dietary supplements you use. Also tell them if you smoke, drink alcohol, or use illegal drugs. Some items may interact with your medicine. ?What should I  watch for while using this medication? ?Visit your health care provider for regular checks on your progress. You may need blood work done while you are taking this medicine. Your condition will be monitored carefully while you are receiving this medicine. It is important not to miss any appointments. ?What side effects may I notice from receiving this medication? ?Side effects that you should report to your doctor or health care professional as soon as possible: ?allergic reactions (skin rash, itching or hives; swelling of the face, lips, or tongue) ?bleeding (bloody or black, tarry stools; red or dark brown urine; spitting up blood or brown material that looks like coffee grounds; red spots on the skin; unusual bruising or bleeding from the eyes, gums, or nose) ?blood clot (chest pain; shortness of breath; pain, swelling, or warmth in the leg) ?stroke (changes in vision; confusion; trouble speaking or understanding; severe headaches; sudden numbness or weakness of the face, arm or leg; trouble walking; dizziness; loss of balance or coordination) ?Side effects that usually do not require medical attention (report to your doctor or health care professional if they continue or are bothersome): ?diarrhea ?dizziness ?headache ?joint pain ?muscle pain ?stomach pain ?trouble sleeping ?This list may not describe all possible side effects. Call your doctor for medical advice about side effects. You may report side effects to FDA at 1-800-FDA-1088. ?Where should I keep my medication? ?This medicine is given in a hospital or clinic. It will not be stored at home. ?NOTE: This sheet is a summary. It may not cover all possible information. If you have questions about this medicine, talk to your doctor, pharmacist, or health care provider. ??   2023 Elsevier/Gold Standard (2021-01-18 00:00:00) ? ?

## 2021-06-25 ENCOUNTER — Ambulatory Visit
Admission: RE | Admit: 2021-06-25 | Discharge: 2021-06-25 | Disposition: A | Discharge status: Home / Self Care | Payer: Medicare Other | Source: Ambulatory Visit | Attending: Nurse Practitioner | Admitting: Nurse Practitioner

## 2021-06-25 DIAGNOSIS — Z08 Encounter for follow-up examination after completed treatment for malignant neoplasm: Secondary | ICD-10-CM

## 2021-06-25 DIAGNOSIS — K921 Melena: Secondary | ICD-10-CM

## 2021-06-25 DIAGNOSIS — D693 Immune thrombocytopenic purpura: Secondary | ICD-10-CM

## 2021-06-25 DIAGNOSIS — I2510 Atherosclerotic heart disease of native coronary artery without angina pectoris: Secondary | ICD-10-CM | POA: Diagnosis not present

## 2021-06-25 DIAGNOSIS — K635 Polyp of colon: Secondary | ICD-10-CM | POA: Diagnosis not present

## 2021-06-25 DIAGNOSIS — N400 Enlarged prostate without lower urinary tract symptoms: Secondary | ICD-10-CM | POA: Diagnosis not present

## 2021-06-27 ENCOUNTER — Encounter: Payer: Self-pay | Admitting: Oncology

## 2021-06-30 ENCOUNTER — Other Ambulatory Visit: Payer: Self-pay | Admitting: Oncology

## 2021-06-30 DIAGNOSIS — N400 Enlarged prostate without lower urinary tract symptoms: Secondary | ICD-10-CM

## 2021-07-01 ENCOUNTER — Inpatient Hospital Stay: Payer: Medicare Other

## 2021-07-01 ENCOUNTER — Other Ambulatory Visit: Payer: Self-pay

## 2021-07-01 ENCOUNTER — Encounter: Payer: Self-pay | Admitting: Interventional Cardiology

## 2021-07-01 ENCOUNTER — Inpatient Hospital Stay (HOSPITAL_BASED_OUTPATIENT_CLINIC_OR_DEPARTMENT_OTHER): Payer: Medicare Other | Admitting: Oncology

## 2021-07-01 ENCOUNTER — Inpatient Hospital Stay: Payer: Medicare Other | Attending: Nurse Practitioner

## 2021-07-01 VITALS — BP 129/67 | HR 62 | Temp 98.2°F | Resp 18 | Ht 65.0 in | Wt 163.0 lb

## 2021-07-01 DIAGNOSIS — D693 Immune thrombocytopenic purpura: Secondary | ICD-10-CM

## 2021-07-01 DIAGNOSIS — N400 Enlarged prostate without lower urinary tract symptoms: Secondary | ICD-10-CM | POA: Diagnosis not present

## 2021-07-01 DIAGNOSIS — M899 Disorder of bone, unspecified: Secondary | ICD-10-CM | POA: Diagnosis not present

## 2021-07-01 LAB — CBC WITH DIFFERENTIAL (CANCER CENTER ONLY)
Abs Immature Granulocytes: 0.51 10*3/uL — ABNORMAL HIGH (ref 0.00–0.07)
Basophils Absolute: 0.1 10*3/uL (ref 0.0–0.1)
Basophils Relative: 1 %
Eosinophils Absolute: 0.2 10*3/uL (ref 0.0–0.5)
Eosinophils Relative: 3 %
HCT: 35.3 % — ABNORMAL LOW (ref 39.0–52.0)
Hemoglobin: 11.2 g/dL — ABNORMAL LOW (ref 13.0–17.0)
Immature Granulocytes: 7 %
Lymphocytes Relative: 17 %
Lymphs Abs: 1.2 10*3/uL (ref 0.7–4.0)
MCH: 26 pg (ref 26.0–34.0)
MCHC: 31.7 g/dL (ref 30.0–36.0)
MCV: 81.9 fL (ref 80.0–100.0)
Monocytes Absolute: 1.5 10*3/uL — ABNORMAL HIGH (ref 0.1–1.0)
Monocytes Relative: 20 %
Neutro Abs: 3.9 10*3/uL (ref 1.7–7.7)
Neutrophils Relative %: 52 %
Platelet Count: 113 10*3/uL — ABNORMAL LOW (ref 150–400)
RBC: 4.31 MIL/uL (ref 4.22–5.81)
RDW: 16.3 % — ABNORMAL HIGH (ref 11.5–15.5)
WBC Count: 7.4 10*3/uL (ref 4.0–10.5)
nRBC: 0 % (ref 0.0–0.2)

## 2021-07-01 MED ORDER — ROMIPLOSTIM 125 MCG ~~LOC~~ SOLR
1.0000 ug/kg | Freq: Once | SUBCUTANEOUS | Status: AC
  Administered 2021-07-01: 75 ug via SUBCUTANEOUS
  Filled 2021-07-01: qty 0.15

## 2021-07-01 NOTE — Progress Notes (Signed)
?[place] ?OFFICE PROGRESS NOTE ? ? ?Diagnosis: ITP ? ?INTERVAL HISTORY:  ? ?Douglas. [name] returns as scheduled.  Continues weekly Nplate.  He is able to receive Nplate while [place] to the beach.  He reports increased low back pain.  He saw Dr. Philip Aspen and was prescribed prednisone, but he has not taken the prednisone. ?He underwent a virtual colonoscopy 06/25/2021.  This revealed a 5 mm polypoid defect in the ascending colon.  Sclerotic lesions were noted throughout the bones, new since 2021.  The prostate is enlarged. ?Douglas. Debono reports a known history of prostatic hypertrophy and an elevated PSA. ? ?Objective: ? ?Vital signs in last 24 hours: ? ?Blood pressure 129/67, pulse 62, temperature 98.2 ?F (36.8 ?C), temperature source Oral, resp. rate 18, height 5' 5" (1.651 m), weight 163 lb (73.9 kg), SpO2 98 %. ?  ? ?Resp: Lungs clear bilaterally ?Cardio: Regular rate and rhythm ?GI: No hepatosplenomegaly ?Vascular: No leg edema ? ?Lab Results: ? ?Lab Results  ?Component Value Date  ? WBC 7.4 07/01/2021  ? HGB 11.2 (L) 07/01/2021  ? HCT 35.3 (L) 07/01/2021  ? MCV 81.9 07/01/2021  ? PLT 113 (L) 07/01/2021  ? NEUTROABS 3.9 07/01/2021  ? ? ?CMP  ?Lab Results  ?Component Value Date  ? NA 141 08/20/2020  ? K 4.2 08/20/2020  ? CL 108 08/20/2020  ? CO2 25 08/20/2020  ? GLUCOSE 113 (H) 08/20/2020  ? BUN 27 (H) 08/20/2020  ? CREATININE 1.27 (H) 08/20/2020  ? CALCIUM 9.1 08/20/2020  ? PROT 6.5 08/20/2020  ? ALBUMIN 3.9 08/20/2020  ? AST 18 08/20/2020  ? ALT 14 08/20/2020  ? ALKPHOS 106 08/20/2020  ? BILITOT 0.4 08/20/2020  ? GFRNONAA 52 (L) 08/20/2020  ? GFRAA 59 (L) 10/31/2019  ? ? ? ?Medications: I have reviewed the patient's current medications. ? ? ?Assessment/Plan: ?Thrombocytopenia ?Bone marrow biopsy 07/08/2016-no evidence of B-cell lymphoma, 40% cellular bone marrow with trilineage hematopoiesis, megakaryocytes present with normal morphology, normal cytogenetics, negative for BRAF mutation ?Flow cytometry  01/05/2009-no monoclonal B-cell or phenotypically abnormal T-cell population ?Prednisone starting 06/29/2019 ?Nplate 07/04/2019, 08/28/3149, Nplate 07/18/2019 ?Prednisone 20 mg daily beginning 07/25/2019 ?Platelets 11,000 on 08/15/2019, prednisone increased to 40 mg daily, Nplate resume 7/61/6073  ?Prednisone decreased to 30 mg daily 08/25/2019, Nplate continued ?Prednisone continued at 30 mg daily 09/13/2019, weekly Nplate continued ?Prednisone taper to 20 mg daily 09/23/2019, weekly Nplate continued ?Prednisone taper to 10 mg daily 09/30/2019, weekly Nplate continued ?Prednisone taper to 5 mg daily 10/21/2019, weekly Nplate continued ?Prednisone taper to 5 mg every other day for 5 doses then stop 11/11/2019, weekly Nplate continued ?Nplate last given 09/10/6267 ?Promacta cost prohibitive ?Nplate beginning 06/08/5460, held 08/27/2020 and 09/03/2020 due to patient vacation ?Nplate resumed 09/03/5007 ?Nplate held 05/09/1827 secondary to an elevated platelet count ?Nplate resumed with dose reduction to 1 mcg/kg 10/09/2020 ?Nplate dose decreased to 0.5 mcg/kg 10/29/2020 ?Nplate dose increased to 1 mcg/kg 11/07/2020 ?Hairy cell leukemia 1982, status post splenectomy ?Coronary artery disease ?Aortic stenosis ?Liposarcoma at the right chest wall resected in 2013 ?Macular degeneration ?Hearing loss ?Basal cell carcinoma left cheek 05/24/2019 ?Left upper lobe nodule, faint FDG activity on PET at [facility] 05/31/2013 ?Status post TAVR procedure 09/06/2019 ?Admission with Pseudomonas urosepsis 09/16/2019 ?Acute left MCA distribution CVA 09/16/2019 ?Covid January 2022 ?Mild anemia, 1 of 3 stool Hemoccult cards positive for occult blood 10/14/2020, ferritin low 06/29/2020 ?Virtual colonoscopy 4 2523-5 mm polypoid defect in the ascending colon ?15.  Sclerotic bone lesions/prostatic hypertrophy on  CT 06/25/2021 ? ? ? ?Disposition: ?Douglas Falkner appears stable.  The platelet count remains adequate.  He will continue weekly Nplate for treatment of chronic ITP. ? ?We reviewed  results of the virtual colonoscopy.  A small polyp was found in the ascending colon.  We decided to defer a colonoscopy given his age and low likelihood of the polyp representing a colon cancer.  I will present his case at the GI tumor conference. ? ?We will check a PSA to follow-up on the sclerotic bone lesions. ? ?He has chronic low back pain that has worsened.  This could be related to prostate cancer or benign etiology.  He would like to see a spine specialist.  I will make a referral to Dr. Tonita Cong. ? ?Douglas. Silguero will return for an office visit in 5 weeks.  He plans to obtain an Nplate injection while [place] the [place] over the [facility] Day holiday. ? ?Betsy Coder, MD ? ?07/01/2021  ?12:21 PM ? ? ?

## 2021-07-02 ENCOUNTER — Telehealth: Payer: Self-pay

## 2021-07-02 DIAGNOSIS — H353221 Exudative age-related macular degeneration, left eye, with active choroidal neovascularization: Secondary | ICD-10-CM | POA: Diagnosis not present

## 2021-07-02 LAB — PROSTATE-SPECIFIC AG, SERUM (LABCORP): Prostate Specific Ag, Serum: 303 ng/mL — ABNORMAL HIGH (ref 0.0–4.0)

## 2021-07-02 NOTE — Telephone Encounter (Signed)
Attempted to call patient per request in MyChart message. No answer. Left message to return call. ?

## 2021-07-03 ENCOUNTER — Telehealth: Payer: Self-pay | Admitting: *Deleted

## 2021-07-03 ENCOUNTER — Other Ambulatory Visit: Payer: Medicare Other | Admitting: *Deleted

## 2021-07-03 DIAGNOSIS — I5032 Chronic diastolic (congestive) heart failure: Secondary | ICD-10-CM

## 2021-07-03 DIAGNOSIS — Z79899 Other long term (current) drug therapy: Secondary | ICD-10-CM

## 2021-07-03 DIAGNOSIS — I10 Essential (primary) hypertension: Secondary | ICD-10-CM

## 2021-07-03 LAB — BASIC METABOLIC PANEL
BUN/Creatinine Ratio: 17 (ref 10–24)
BUN: 22 mg/dL (ref 10–36)
CO2: 23 mmol/L (ref 20–29)
Calcium: 8.5 mg/dL — ABNORMAL LOW (ref 8.6–10.2)
Chloride: 105 mmol/L (ref 96–106)
Creatinine, Ser: 1.28 mg/dL — ABNORMAL HIGH (ref 0.76–1.27)
Glucose: 133 mg/dL — ABNORMAL HIGH (ref 70–99)
Potassium: 4.6 mmol/L (ref 3.5–5.2)
Sodium: 140 mmol/L (ref 134–144)
eGFR: 52 mL/min/{1.73_m2} — ABNORMAL LOW (ref >59)

## 2021-07-03 NOTE — Telephone Encounter (Signed)
Received a staff message to call pt.  Call placed to pt. Actually was meant for [facility], which is no longer with [place]. ?Per pt, he was confused regarding his appointment today.  Pt was scheduled for BMET due to new start of Farxiga in last office appointment with Dr. Tamala Julian, in March. ?Per pt, he hasn't started the medication as of yet, as he stated he went out of town and he didn't want to start something new and risk side effects / etc, after reading upon the medication. ? ?I advised pt he didn't have to come in for lab work, but pt stated he had called or sent a message regarding this and never heard back from anyone, so he just kept the appointment. ? ?Pt has been made aware to call and let Dr. Thompson Caul RN know when he starts [place] and he would need to come in 2-4 weeks later for another BMET. ? ?Pt verbalized understanding and thanked me for the call back.  ?

## 2021-07-03 NOTE — Telephone Encounter (Signed)
-----   Message from Judie Grieve sent at 07/03/2021 10:50 AM EDT ----- ?Regarding: Patient requesting call from you ?Good morning [name], ? ?He came for labs today and would like a call from you, confirmed his best phone number is [phone] ? ?

## 2021-07-05 ENCOUNTER — Other Ambulatory Visit: Payer: Self-pay | Admitting: *Deleted

## 2021-07-05 DIAGNOSIS — D693 Immune thrombocytopenic purpura: Secondary | ICD-10-CM

## 2021-07-05 NOTE — Progress Notes (Signed)
Lab orders placed.  

## 2021-07-08 ENCOUNTER — Telehealth: Payer: Self-pay

## 2021-07-08 ENCOUNTER — Inpatient Hospital Stay: Payer: Medicare Other

## 2021-07-08 VITALS — BP 127/61 | HR 68 | Temp 98.1°F | Resp 20 | Ht 65.0 in | Wt 162.5 lb

## 2021-07-08 DIAGNOSIS — D693 Immune thrombocytopenic purpura: Secondary | ICD-10-CM

## 2021-07-08 DIAGNOSIS — N400 Enlarged prostate without lower urinary tract symptoms: Secondary | ICD-10-CM | POA: Diagnosis not present

## 2021-07-08 DIAGNOSIS — M899 Disorder of bone, unspecified: Secondary | ICD-10-CM | POA: Diagnosis not present

## 2021-07-08 LAB — CBC WITH DIFFERENTIAL (CANCER CENTER ONLY)
Abs Immature Granulocytes: 0.2 10*3/uL — ABNORMAL HIGH (ref 0.00–0.07)
Basophils Absolute: 0.1 10*3/uL (ref 0.0–0.1)
Basophils Relative: 1 %
Eosinophils Absolute: 0.3 10*3/uL (ref 0.0–0.5)
Eosinophils Relative: 3 %
HCT: 34.4 % — ABNORMAL LOW (ref 39.0–52.0)
Hemoglobin: 10.9 g/dL — ABNORMAL LOW (ref 13.0–17.0)
Lymphocytes Relative: 17 %
Lymphs Abs: 1.9 10*3/uL (ref 0.7–4.0)
MCH: 26.3 pg (ref 26.0–34.0)
MCHC: 31.7 g/dL (ref 30.0–36.0)
MCV: 82.9 fL (ref 80.0–100.0)
Metamyelocytes Relative: 1 %
Monocytes Absolute: 1.9 10*3/uL — ABNORMAL HIGH (ref 0.1–1.0)
Monocytes Relative: 17 %
Myelocytes: 1 %
Neutro Abs: 6.6 10*3/uL (ref 1.7–7.7)
Neutrophils Relative %: 60 %
Platelet Count: 143 10*3/uL — ABNORMAL LOW (ref 150–400)
RBC: 4.15 MIL/uL — ABNORMAL LOW (ref 4.22–5.81)
RDW: 16.5 % — ABNORMAL HIGH (ref 11.5–15.5)
Smear Review: ADEQUATE
WBC Count: 11 10*3/uL — ABNORMAL HIGH (ref 4.0–10.5)
nRBC: 0.2 % (ref 0.0–0.2)

## 2021-07-08 MED ORDER — ROMIPLOSTIM 125 MCG ~~LOC~~ SOLR
1.0000 ug/kg | Freq: Once | SUBCUTANEOUS | Status: AC
  Administered 2021-07-08: 75 ug via SUBCUTANEOUS
  Filled 2021-07-08: qty 0.15

## 2021-07-08 NOTE — Telephone Encounter (Signed)
Pt verbalized understanding. Appointment already scheduled for a month with Lattie Haw T ?

## 2021-07-08 NOTE — Telephone Encounter (Signed)
-----   Message from Ladell Pier, MD sent at 07/06/2021 12:37 PM EDT ----- ?PSA is elevated consistent with prostate cancer,prostate cancer may be contributing to back pain, f/u as scheduled, be sure he has a f/u office visit next 1 month ?

## 2021-07-08 NOTE — Patient Instructions (Signed)
Romiplostim injection ?What is this medication? ?ROMIPLOSTIM (roe mi PLOE stim) helps your body make more platelets. This medicine is used to treat low platelets caused by chronic idiopathic thrombocytopenic purpura (ITP) or a bone marrow syndrome caused by radiation sickness. ?This medicine may be used for other purposes; ask your health care provider or pharmacist if you have questions. ?COMMON BRAND NAME(S): Nplate ?What should I tell my care team before I take this medication? ?They need to know if you have any of these conditions: ?blood clots ?myelodysplastic syndrome ?an unusual or allergic reaction to romiplostim, mannitol, other medicines, foods, dyes, or preservatives ?pregnant or trying to get pregnant ?breast-feeding ?How should I use this medication? ?This medicine is injected under the skin. It is given by a health care provider in a hospital or clinic setting. ?A special MedGuide will be given to you before each treatment. Be sure to read this information carefully each time. ?Talk to your health care provider about the use of this medicine in children. While it may be prescribed for children as young as newborns for selected conditions, precautions do apply. ?Overdosage: If you think you have taken too much of this medicine contact a poison control center or emergency room at once. ?NOTE: This medicine is only for you. Do not share this medicine with others. ?What if I miss a dose? ?Keep appointments for follow-up doses. It is important not to miss your dose. Call your health care provider if you are unable to keep an appointment. ?What may interact with this medication? ?Interactions are not expected. ?This list may not describe all possible interactions. Give your health care provider a list of all the medicines, herbs, non-prescription drugs, or dietary supplements you use. Also tell them if you smoke, drink alcohol, or use illegal drugs. Some items may interact with your medicine. ?What should I  watch for while using this medication? ?Visit your health care provider for regular checks on your progress. You may need blood work done while you are taking this medicine. Your condition will be monitored carefully while you are receiving this medicine. It is important not to miss any appointments. ?What side effects may I notice from receiving this medication? ?Side effects that you should report to your doctor or health care professional as soon as possible: ?allergic reactions (skin rash, itching or hives; swelling of the face, lips, or tongue) ?bleeding (bloody or black, tarry stools; red or dark brown urine; spitting up blood or brown material that looks like coffee grounds; red spots on the skin; unusual bruising or bleeding from the eyes, gums, or nose) ?blood clot (chest pain; shortness of breath; pain, swelling, or warmth in the leg) ?stroke (changes in vision; confusion; trouble speaking or understanding; severe headaches; sudden numbness or weakness of the face, arm or leg; trouble walking; dizziness; loss of balance or coordination) ?Side effects that usually do not require medical attention (report to your doctor or health care professional if they continue or are bothersome): ?diarrhea ?dizziness ?headache ?joint pain ?muscle pain ?stomach pain ?trouble sleeping ?This list may not describe all possible side effects. Call your doctor for medical advice about side effects. You may report side effects to FDA at 1-800-FDA-1088. ?Where should I keep my medication? ?This medicine is given in a hospital or clinic. It will not be stored at home. ?NOTE: This sheet is a summary. It may not cover all possible information. If you have questions about this medicine, talk to your doctor, pharmacist, or health care provider. ??   2023 Elsevier/Gold Standard (2021-01-18 00:00:00) ? ?

## 2021-07-10 ENCOUNTER — Other Ambulatory Visit: Payer: Self-pay

## 2021-07-10 NOTE — Progress Notes (Signed)
The proposed treatment discussed in conference is for discussion purpose only and is not a binding recommendation.  The patients have not been physically examined, or presented with their treatment options.  Therefore, final treatment plans cannot be decided.  

## 2021-07-11 ENCOUNTER — Encounter: Payer: Self-pay | Admitting: Interventional Cardiology

## 2021-07-11 NOTE — Telephone Encounter (Signed)
Attempted to contact patient to see if he has started his [place]. No answer, left message for patient to callback. ?

## 2021-07-15 ENCOUNTER — Inpatient Hospital Stay: Payer: Medicare Other

## 2021-07-15 VITALS — BP 122/74 | HR 65 | Temp 97.8°F | Resp 18 | Ht 65.0 in | Wt 161.2 lb

## 2021-07-15 DIAGNOSIS — N400 Enlarged prostate without lower urinary tract symptoms: Secondary | ICD-10-CM | POA: Diagnosis not present

## 2021-07-15 DIAGNOSIS — M899 Disorder of bone, unspecified: Secondary | ICD-10-CM | POA: Diagnosis not present

## 2021-07-15 DIAGNOSIS — D693 Immune thrombocytopenic purpura: Secondary | ICD-10-CM

## 2021-07-15 LAB — CBC WITH DIFFERENTIAL (CANCER CENTER ONLY)
Abs Immature Granulocytes: 0.41 10*3/uL — ABNORMAL HIGH (ref 0.00–0.07)
Basophils Absolute: 0.1 10*3/uL (ref 0.0–0.1)
Basophils Relative: 1 %
Eosinophils Absolute: 0.2 10*3/uL (ref 0.0–0.5)
Eosinophils Relative: 2 %
HCT: 35.1 % — ABNORMAL LOW (ref 39.0–52.0)
Hemoglobin: 11.1 g/dL — ABNORMAL LOW (ref 13.0–17.0)
Immature Granulocytes: 5 %
Lymphocytes Relative: 15 %
Lymphs Abs: 1.3 10*3/uL (ref 0.7–4.0)
MCH: 26.2 pg (ref 26.0–34.0)
MCHC: 31.6 g/dL (ref 30.0–36.0)
MCV: 83 fL (ref 80.0–100.0)
Monocytes Absolute: 1.8 10*3/uL — ABNORMAL HIGH (ref 0.1–1.0)
Monocytes Relative: 20 %
Neutro Abs: 5.3 10*3/uL (ref 1.7–7.7)
Neutrophils Relative %: 57 %
Platelet Count: 97 10*3/uL — ABNORMAL LOW (ref 150–400)
RBC: 4.23 MIL/uL (ref 4.22–5.81)
RDW: 16.8 % — ABNORMAL HIGH (ref 11.5–15.5)
WBC Count: 9.1 10*3/uL (ref 4.0–10.5)
nRBC: 0 % (ref 0.0–0.2)

## 2021-07-15 MED ORDER — ROMIPLOSTIM 125 MCG ~~LOC~~ SOLR
1.0000 ug/kg | Freq: Once | SUBCUTANEOUS | Status: AC
  Administered 2021-07-15: 75 ug via SUBCUTANEOUS
  Filled 2021-07-15: qty 0.15

## 2021-07-15 NOTE — Telephone Encounter (Signed)
Pt will call back this week with update on breathing since starting the [place] ?

## 2021-07-15 NOTE — Patient Instructions (Signed)
Romiplostim injection ?What is this medication? ?ROMIPLOSTIM (roe mi PLOE stim) helps your body make more platelets. This medicine is used to treat low platelets caused by chronic idiopathic thrombocytopenic purpura (ITP) or a bone marrow syndrome caused by radiation sickness. ?This medicine may be used for other purposes; ask your health care provider or pharmacist if you have questions. ?COMMON BRAND NAME(S): Nplate ?What should I tell my care team before I take this medication? ?They need to know if you have any of these conditions: ?blood clots ?myelodysplastic syndrome ?an unusual or allergic reaction to romiplostim, mannitol, other medicines, foods, dyes, or preservatives ?pregnant or trying to get pregnant ?breast-feeding ?How should I use this medication? ?This medicine is injected under the skin. It is given by a health care provider in a hospital or clinic setting. ?A special MedGuide will be given to you before each treatment. Be sure to read this information carefully each time. ?Talk to your health care provider about the use of this medicine in children. While it may be prescribed for children as young as newborns for selected conditions, precautions do apply. ?Overdosage: If you think you have taken too much of this medicine contact a poison control center or emergency room at once. ?NOTE: This medicine is only for you. Do not share this medicine with others. ?What if I miss a dose? ?Keep appointments for follow-up doses. It is important not to miss your dose. Call your health care provider if you are unable to keep an appointment. ?What may interact with this medication? ?Interactions are not expected. ?This list may not describe all possible interactions. Give your health care provider a list of all the medicines, herbs, non-prescription drugs, or dietary supplements you use. Also tell them if you smoke, drink alcohol, or use illegal drugs. Some items may interact with your medicine. ?What should I  watch for while using this medication? ?Visit your health care provider for regular checks on your progress. You may need blood work done while you are taking this medicine. Your condition will be monitored carefully while you are receiving this medicine. It is important not to miss any appointments. ?What side effects may I notice from receiving this medication? ?Side effects that you should report to your doctor or health care professional as soon as possible: ?allergic reactions (skin rash, itching or hives; swelling of the face, lips, or tongue) ?bleeding (bloody or black, tarry stools; red or dark brown urine; spitting up blood or brown material that looks like coffee grounds; red spots on the skin; unusual bruising or bleeding from the eyes, gums, or nose) ?blood clot (chest pain; shortness of breath; pain, swelling, or warmth in the leg) ?stroke (changes in vision; confusion; trouble speaking or understanding; severe headaches; sudden numbness or weakness of the face, arm or leg; trouble walking; dizziness; loss of balance or coordination) ?Side effects that usually do not require medical attention (report to your doctor or health care professional if they continue or are bothersome): ?diarrhea ?dizziness ?headache ?joint pain ?muscle pain ?stomach pain ?trouble sleeping ?This list may not describe all possible side effects. Call your doctor for medical advice about side effects. You may report side effects to FDA at 1-800-FDA-1088. ?Where should I keep my medication? ?This medicine is given in a hospital or clinic. It will not be stored at home. ?NOTE: This sheet is a summary. It may not cover all possible information. If you have questions about this medicine, talk to your doctor, pharmacist, or health care provider. ??   2023 Elsevier/Gold Standard (2021-01-18 00:00:00) ? ?

## 2021-07-17 ENCOUNTER — Encounter: Payer: Self-pay | Admitting: *Deleted

## 2021-07-17 NOTE — Progress Notes (Signed)
Call from [facility] with [facility] in [place] requesting recent records be faxed to [phone]. Last provider that saw him is no longer in practice and did not dictate a note after his visit there and MR. Rodeheaver is due Nplate on 7/95/58.  ?Can be reached at [phone]. ?

## 2021-07-18 NOTE — Addendum Note (Signed)
Addended by: Molli Barrows on: 07/18/2021 10:54 AM   Modules accepted: Orders

## 2021-07-18 NOTE — Telephone Encounter (Signed)
Spoke with patient and his wife [name] about starting on Farxiga and update on breathing.  Patient reports he has not noticed any difference in his breathing since starting on Farxiga.   Patient and wife are planning to go to the beach this weekend and return [facility].  Scheduled lab appt for patient for follow-up BMET on 07/31/21.  Patient verbalized understanding.

## 2021-07-22 ENCOUNTER — Inpatient Hospital Stay: Payer: Medicare Other

## 2021-07-22 DIAGNOSIS — Z79899 Other long term (current) drug therapy: Secondary | ICD-10-CM | POA: Diagnosis not present

## 2021-07-22 DIAGNOSIS — D693 Immune thrombocytopenic purpura: Secondary | ICD-10-CM | POA: Diagnosis not present

## 2021-07-30 ENCOUNTER — Other Ambulatory Visit (HOSPITAL_COMMUNITY): Payer: Self-pay

## 2021-07-30 ENCOUNTER — Inpatient Hospital Stay: Payer: Medicare Other

## 2021-07-30 ENCOUNTER — Encounter: Payer: Self-pay | Admitting: Oncology

## 2021-07-30 ENCOUNTER — Other Ambulatory Visit: Payer: Self-pay | Admitting: *Deleted

## 2021-07-30 ENCOUNTER — Other Ambulatory Visit: Payer: Self-pay

## 2021-07-30 ENCOUNTER — Inpatient Hospital Stay (HOSPITAL_BASED_OUTPATIENT_CLINIC_OR_DEPARTMENT_OTHER): Payer: Medicare Other | Admitting: Oncology

## 2021-07-30 ENCOUNTER — Other Ambulatory Visit: Payer: Self-pay | Admitting: Nurse Practitioner

## 2021-07-30 VITALS — BP 112/63 | HR 78 | Temp 98.4°F | Resp 18 | Ht 65.0 in | Wt 158.6 lb

## 2021-07-30 DIAGNOSIS — C7951 Secondary malignant neoplasm of bone: Secondary | ICD-10-CM

## 2021-07-30 DIAGNOSIS — C61 Malignant neoplasm of prostate: Secondary | ICD-10-CM | POA: Diagnosis not present

## 2021-07-30 DIAGNOSIS — D693 Immune thrombocytopenic purpura: Secondary | ICD-10-CM

## 2021-07-30 DIAGNOSIS — N400 Enlarged prostate without lower urinary tract symptoms: Secondary | ICD-10-CM | POA: Diagnosis not present

## 2021-07-30 DIAGNOSIS — M899 Disorder of bone, unspecified: Secondary | ICD-10-CM | POA: Diagnosis not present

## 2021-07-30 LAB — CBC WITH DIFFERENTIAL (CANCER CENTER ONLY)
Abs Immature Granulocytes: 0.65 10*3/uL — ABNORMAL HIGH (ref 0.00–0.07)
Basophils Absolute: 0.1 10*3/uL (ref 0.0–0.1)
Basophils Relative: 1 %
Eosinophils Absolute: 0.1 10*3/uL (ref 0.0–0.5)
Eosinophils Relative: 1 %
HCT: 35 % — ABNORMAL LOW (ref 39.0–52.0)
Hemoglobin: 11.1 g/dL — ABNORMAL LOW (ref 13.0–17.0)
Immature Granulocytes: 6 %
Lymphocytes Relative: 7 %
Lymphs Abs: 0.8 10*3/uL (ref 0.7–4.0)
MCH: 25.9 pg — ABNORMAL LOW (ref 26.0–34.0)
MCHC: 31.7 g/dL (ref 30.0–36.0)
MCV: 81.8 fL (ref 80.0–100.0)
Monocytes Absolute: 2.4 10*3/uL — ABNORMAL HIGH (ref 0.1–1.0)
Monocytes Relative: 21 %
Neutro Abs: 7.2 10*3/uL (ref 1.7–7.7)
Neutrophils Relative %: 64 %
Platelet Count: 91 10*3/uL — ABNORMAL LOW (ref 150–400)
RBC: 4.28 MIL/uL (ref 4.22–5.81)
RDW: 17 % — ABNORMAL HIGH (ref 11.5–15.5)
WBC Count: 11.1 10*3/uL — ABNORMAL HIGH (ref 4.0–10.5)
nRBC: 0.3 % — ABNORMAL HIGH (ref 0.0–0.2)

## 2021-07-30 MED ORDER — BICALUTAMIDE 50 MG PO TABS
50.0000 mg | ORAL_TABLET | Freq: Every day | ORAL | 0 refills | Status: DC
  Filled 2021-07-30: qty 14, 14d supply, fill #0

## 2021-07-30 MED ORDER — TRAMADOL HCL 50 MG PO TABS: 25.0000 mg | ORAL_TABLET | Freq: Three times a day (TID) | ORAL | 0 refills | Status: DC | PRN

## 2021-07-30 MED ORDER — ROMIPLOSTIM 125 MCG ~~LOC~~ SOLR
1.0000 ug/kg | Freq: Once | SUBCUTANEOUS | Status: AC
  Administered 2021-07-30: 70 ug via SUBCUTANEOUS
  Filled 2021-07-30: qty 0.14

## 2021-07-30 NOTE — Progress Notes (Signed)
Patient with complaint of low grade fever last night.  Took tylenol and fever broke.  No evidence of fever during visit today.  Patient also complaining of back pain 5/10 constant that he has had for weeks.  Patient and patient's wife requested for Dr. Benay Spice to see pt.  Dr. Benay Spice made aware and will come to infusion room to see patient.

## 2021-07-30 NOTE — Progress Notes (Signed)
Per Dr. Benay Spice: Needs to start Casodex 50 mg daily beginning 08/02/21 to prevent flare reaction with initiation of Lupon for his prostate cancer.

## 2021-07-30 NOTE — Progress Notes (Signed)
[facility] OFFICE PROGRESS NOTE   Diagnosis: Thrombocytopenia, prostate cancer  INTERVAL HISTORY:   [name] returns prior to scheduled visit.  He continues weekly Nplate.  He is here today with his wife.  She reports he has developed malaise and increased back pain over the past few weeks.  He has pain at the upper sacrum bilaterally.  He is not taking pain medication  He had a low-grade fever last night.  He has sinus congestion.  No sore throat or cough.  He has generalized weakness making it difficult for him to stand and dress himself.  No focal weakness.  No difficulty with bowel or bladder function.  Objective:  Vital signs in last 24 hours:  There were no vitals taken for this visit.    HEENT: No thrush, no sinus tenderness Resp: Fine end inspiratory rales at the lower posterior chest bilaterally, no respiratory distress Cardio: Regular rate and rhythm GI: Nontender Vascular: No leg edema Neuro: The motor exam appears intact in the legs bilaterally   Portacath/PICC-without erythema  Lab Results:  Lab Results  Component Value Date   WBC 11.1 (H) 07/30/2021   HGB 11.1 (L) 07/30/2021   HCT 35.0 (L) 07/30/2021   MCV 81.8 07/30/2021   PLT 91 (L) 07/30/2021   NEUTROABS 7.2 07/30/2021    CMP  Lab Results  Component Value Date   NA 140 07/03/2021   K 4.6 07/03/2021   CL 105 07/03/2021   CO2 23 07/03/2021   GLUCOSE 133 (H) 07/03/2021   BUN 22 07/03/2021   CREATININE 1.28 (H) 07/03/2021   CALCIUM 8.5 (L) 07/03/2021   PROT 6.5 08/20/2020   ALBUMIN 3.9 08/20/2020   AST 18 08/20/2020   ALT 14 08/20/2020   ALKPHOS 106 08/20/2020   BILITOT 0.4 08/20/2020   GFRNONAA 52 (L) 08/20/2020   GFRAA 59 (L) 10/31/2019    Medications: I have reviewed the patient's current medications.   Assessment/Plan:  Thrombocytopenia Bone marrow biopsy 07/08/2016-no evidence of B-cell lymphoma, 40% cellular bone marrow with trilineage hematopoiesis, megakaryocytes  present with normal morphology, normal cytogenetics, negative for BRAF mutation Flow cytometry 01/05/2009-no monoclonal B-cell or phenotypically abnormal T-cell population Prednisone starting 06/29/2019 Nplate 07/04/2019, 07/11/2019, Nplate 07/18/2019 Prednisone 20 mg daily beginning 07/25/2019 Platelets 11,000 on 08/15/2019, prednisone increased to 40 mg daily, Nplate resume 08/17/2019  Prednisone decreased to 30 mg daily 08/25/2019, Nplate continued Prednisone continued at 30 mg daily 09/13/2019, weekly Nplate continued Prednisone taper to 20 mg daily 09/23/2019, weekly Nplate continued Prednisone taper to 10 mg daily 09/30/2019, weekly Nplate continued Prednisone taper to 5 mg daily 10/21/2019, weekly Nplate continued Prednisone taper to 5 mg every other day for 5 doses then stop 11/11/2019, weekly Nplate continued Nplate last given 11/21/2019 Promacta cost prohibitive Nplate beginning 08/01/2020, held 08/27/2020 and 09/03/2020 due to patient vacation Nplate resumed 09/10/2020 Nplate held 10/01/2020 secondary to an elevated platelet count Nplate resumed with dose reduction to 1 mcg/kg 10/09/2020 Nplate dose decreased to 0.5 mcg/kg 10/29/2020 Nplate dose increased to 1 mcg/kg 11/07/2020 Hairy cell leukemia 1982, status post splenectomy Coronary artery disease Aortic stenosis Liposarcoma at the right chest wall resected in 2013 Macular degeneration Hearing loss Basal cell carcinoma left cheek 05/24/2019 Left upper lobe nodule, faint FDG activity on PET at [facility] 05/31/2013 Status post TAVR procedure 09/06/2019 Admission with Pseudomonas urosepsis 09/16/2019 Acute left MCA distribution CVA 09/16/2019 Covid January 2022 Mild anemia, 1 of 3 stool Hemoccult cards positive for occult blood 10/14/2020, ferritin low 06/29/2020   Virtual colonoscopy 06/25/2021 -5 mm polypoid defect in the ascending colon 15.  Prostate cancer-sclerotic bone lesions/prostatic hypertrophy on CT 06/25/2021     Disposition: [name] has chronic  thrombocytopenia, improved with weekly Nplate.  Thrombocytopenia is likely related to chronic ITP, though prostate cancer may be contributing.  He has increased back pain.  Multiple sclerotic lesions in the spine and pelvis on the virtual colonoscopy images on 06/25/2021.  The PSA is markedly elevated.  [name] appears to have metastatic prostate cancer involving the bones.  I suspect prostate cancer explains the bone pain.  I discussed the diagnosis and treatment options with [name] and his wife.  I recommend proceeding with antiandrogen therapy.  He agrees.  We reviewed potential toxicities associated with antiandrogen therapy including hot flashes and a flare of pain.  The plan is to begin leuprolide therapy later this week.  He will take bicalutamide for the first 2 weeks of treatment.  He will continue weekly Nplate for thrombocytopenia.  [name] will return for an office visit as scheduled next week. The low-grade fever last night could be related to tumor fever or an infection.  He will call for a persistent fever or new symptoms.  Betsy Coder, MD  07/30/2021  2:05 PM

## 2021-07-31 ENCOUNTER — Other Ambulatory Visit: Payer: Medicare Other | Admitting: *Deleted

## 2021-07-31 ENCOUNTER — Encounter: Payer: Self-pay | Admitting: Interventional Cardiology

## 2021-07-31 DIAGNOSIS — I10 Essential (primary) hypertension: Secondary | ICD-10-CM | POA: Diagnosis not present

## 2021-07-31 DIAGNOSIS — I5032 Chronic diastolic (congestive) heart failure: Secondary | ICD-10-CM | POA: Diagnosis not present

## 2021-07-31 DIAGNOSIS — Z79899 Other long term (current) drug therapy: Secondary | ICD-10-CM | POA: Diagnosis not present

## 2021-07-31 LAB — BASIC METABOLIC PANEL
BUN/Creatinine Ratio: 14 (ref 10–24)
BUN: 19 mg/dL (ref 10–36)
CO2: 20 mmol/L (ref 20–29)
Calcium: 8.3 mg/dL — ABNORMAL LOW (ref 8.6–10.2)
Chloride: 104 mmol/L (ref 96–106)
Creatinine, Ser: 1.38 mg/dL — ABNORMAL HIGH (ref 0.76–1.27)
Glucose: 145 mg/dL — ABNORMAL HIGH (ref 70–99)
Potassium: 4 mmol/L (ref 3.5–5.2)
Sodium: 138 mmol/L (ref 134–144)
eGFR: 47 mL/min/{1.73_m2} — ABNORMAL LOW (ref >59)

## 2021-08-01 ENCOUNTER — Other Ambulatory Visit (HOSPITAL_COMMUNITY): Payer: Self-pay

## 2021-08-01 ENCOUNTER — Encounter: Payer: Self-pay | Admitting: Oncology

## 2021-08-02 ENCOUNTER — Inpatient Hospital Stay: Payer: Medicare Other | Attending: Nurse Practitioner

## 2021-08-02 VITALS — BP 122/66 | HR 66 | Temp 97.6°F | Resp 18

## 2021-08-02 DIAGNOSIS — D696 Thrombocytopenia, unspecified: Secondary | ICD-10-CM | POA: Insufficient documentation

## 2021-08-02 DIAGNOSIS — C7951 Secondary malignant neoplasm of bone: Secondary | ICD-10-CM | POA: Insufficient documentation

## 2021-08-02 DIAGNOSIS — C61 Malignant neoplasm of prostate: Secondary | ICD-10-CM | POA: Insufficient documentation

## 2021-08-02 DIAGNOSIS — D693 Immune thrombocytopenic purpura: Secondary | ICD-10-CM

## 2021-08-02 DIAGNOSIS — Z5111 Encounter for antineoplastic chemotherapy: Secondary | ICD-10-CM | POA: Diagnosis not present

## 2021-08-02 MED ORDER — LEUPROLIDE ACETATE (3 MONTH) 22.5 MG ~~LOC~~ KIT
22.5000 mg | PACK | Freq: Once | SUBCUTANEOUS | Status: AC
  Administered 2021-08-02: 22.5 mg via SUBCUTANEOUS
  Filled 2021-08-02: qty 22.5

## 2021-08-02 NOTE — Patient Instructions (Signed)
Leuprolide depot injection What is this medication? LEUPROLIDE (loo PROE lide) is a man-made protein that acts like a natural hormone in the body. It decreases testosterone in men and decreases estrogen in women. In men, this medicine is used to treat advanced prostate cancer. In women, some forms of this medicine may be used to treat endometriosis, uterine fibroids, or other male hormone-related problems. This medicine may be used for other purposes; ask your health care provider or pharmacist if you have questions. COMMON BRAND NAME(S): Eligard, Fensolv, Lupron Depot, Lupron Depot-Ped, Viadur What should I tell my care team before I take this medication? They need to know if you have any of these conditions: diabetes heart disease or previous heart attack high blood pressure high cholesterol mental illness osteoporosis pain or difficulty passing urine seizures spinal cord metastasis stroke suicidal thoughts, plans, or attempt; a previous suicide attempt by you or a family member tobacco smoker unusual vaginal bleeding (women) an unusual or allergic reaction to leuprolide, benzyl alcohol, other medicines, foods, dyes, or preservatives pregnant or trying to get pregnant breast-feeding How should I use this medication? This medicine is for injection into a muscle or for injection under the skin. It is given by a health care professional in a hospital or clinic setting. The specific product will determine how it will be given to you. Make sure you understand which product you receive and how often you will receive it. Talk to your pediatrician regarding the use of this medicine in children. Special care may be needed. Overdosage: If you think you have taken too much of this medicine contact a poison control center or emergency room at once. NOTE: This medicine is only for you. Do not share this medicine with others. What if I miss a dose? It is important not to miss a dose. Call your  doctor or health care professional if you are unable to keep an appointment. Depot injections: Depot injections are given either once-monthly, every 12 weeks, every 16 weeks, or every 24 weeks depending on the product you are prescribed. The product you are prescribed will be based on if you are male or male, and your condition. Make sure you understand your product and dosing. What may interact with this medication? Do not take this medicine with any of the following medications: chasteberry cisapride dronedarone pimozide thioridazine This medicine may also interact with the following medications: herbal or dietary supplements, like black cohosh or DHEA male hormones, like estrogens or progestins and birth control pills, patches, rings, or injections male hormones, like testosterone other medicines that prolong the QT interval (abnormal heart rhythm) This list may not describe all possible interactions. Give your health care provider a list of all the medicines, herbs, non-prescription drugs, or dietary supplements you use. Also tell them if you smoke, drink alcohol, or use illegal drugs. Some items may interact with your medicine. What should I watch for while using this medication? Visit your doctor or health care professional for regular checks on your progress. During the first weeks of treatment, your symptoms may get worse, but then will improve as you continue your treatment. You may get hot flashes, increased bone pain, increased difficulty passing urine, or an aggravation of nerve symptoms. Discuss these effects with your doctor or health care professional, some of them may improve with continued use of this medicine. Male patients may experience a menstrual cycle or spotting during the first months of therapy with this medicine. If this continues, contact your doctor or  health care professional. This medicine may increase blood sugar. Ask your healthcare provider if changes in diet  or medicines are needed if you have diabetes. What side effects may I notice from receiving this medication? Side effects that you should report to your doctor or health care professional as soon as possible: allergic reactions like skin rash, itching or hives, swelling of the face, lips, or tongue breathing problems chest pain depression or memory disorders pain in your legs or groin pain at site where injected or implanted seizures severe headache signs and symptoms of high blood sugar such as being more thirsty or hungry or having to urinate more than normal. You may also feel very tired or have blurry vision swelling of the feet and legs suicidal thoughts or other mood changes visual changes vomiting Side effects that usually do not require medical attention (report to your doctor or health care professional if they continue or are bothersome): breast swelling or tenderness decrease in sex drive or performance diarrhea hot flashes loss of appetite muscle, joint, or bone pains nausea redness or irritation at site where injected or implanted skin problems or acne This list may not describe all possible side effects. Call your doctor for medical advice about side effects. You may report side effects to FDA at 1-800-FDA-1088. Where should I keep my medication? This drug is given in a hospital or clinic and will not be stored at home. NOTE: This sheet is a summary. It may not cover all possible information. If you have questions about this medicine, talk to your doctor, pharmacist, or health care provider.  2023 Elsevier/Gold Standard (2021-01-18 00:00:00)  

## 2021-08-05 ENCOUNTER — Encounter: Payer: Self-pay | Admitting: Nurse Practitioner

## 2021-08-05 ENCOUNTER — Inpatient Hospital Stay: Payer: Medicare Other

## 2021-08-05 ENCOUNTER — Inpatient Hospital Stay (HOSPITAL_BASED_OUTPATIENT_CLINIC_OR_DEPARTMENT_OTHER): Payer: Medicare Other | Admitting: Nurse Practitioner

## 2021-08-05 VITALS — BP 137/35 | HR 66 | Temp 98.2°F | Resp 18 | Ht 65.0 in | Wt 157.8 lb

## 2021-08-05 DIAGNOSIS — C61 Malignant neoplasm of prostate: Secondary | ICD-10-CM

## 2021-08-05 DIAGNOSIS — C7951 Secondary malignant neoplasm of bone: Secondary | ICD-10-CM

## 2021-08-05 DIAGNOSIS — D693 Immune thrombocytopenic purpura: Secondary | ICD-10-CM

## 2021-08-05 DIAGNOSIS — D696 Thrombocytopenia, unspecified: Secondary | ICD-10-CM | POA: Diagnosis not present

## 2021-08-05 DIAGNOSIS — Z5111 Encounter for antineoplastic chemotherapy: Secondary | ICD-10-CM | POA: Diagnosis not present

## 2021-08-05 LAB — CBC WITH DIFFERENTIAL (CANCER CENTER ONLY)
Abs Immature Granulocytes: 0.2 10*3/uL — ABNORMAL HIGH (ref 0.00–0.07)
Band Neutrophils: 1 %
Basophils Absolute: 0.2 10*3/uL — ABNORMAL HIGH (ref 0.0–0.1)
Basophils Relative: 2 %
Eosinophils Absolute: 0.2 10*3/uL (ref 0.0–0.5)
Eosinophils Relative: 3 %
HCT: 34.1 % — ABNORMAL LOW (ref 39.0–52.0)
Hemoglobin: 10.7 g/dL — ABNORMAL LOW (ref 13.0–17.0)
Lymphocytes Relative: 14 %
Lymphs Abs: 1.1 10*3/uL (ref 0.7–4.0)
MCH: 25.7 pg — ABNORMAL LOW (ref 26.0–34.0)
MCHC: 31.4 g/dL (ref 30.0–36.0)
MCV: 82 fL (ref 80.0–100.0)
Metamyelocytes Relative: 1 %
Monocytes Absolute: 1.4 10*3/uL — ABNORMAL HIGH (ref 0.1–1.0)
Monocytes Relative: 17 %
Myelocytes: 1 %
Neutro Abs: 5.1 10*3/uL (ref 1.7–7.7)
Neutrophils Relative %: 61 %
Platelet Count: 126 10*3/uL — ABNORMAL LOW (ref 150–400)
RBC: 4.16 MIL/uL — ABNORMAL LOW (ref 4.22–5.81)
RDW: 17.2 % — ABNORMAL HIGH (ref 11.5–15.5)
WBC Count: 8.2 10*3/uL (ref 4.0–10.5)
nRBC: 0.2 % (ref 0.0–0.2)

## 2021-08-05 MED ORDER — ROMIPLOSTIM 125 MCG ~~LOC~~ SOLR
1.0000 ug/kg | Freq: Once | SUBCUTANEOUS | Status: AC
  Administered 2021-08-05: 70 ug via SUBCUTANEOUS
  Filled 2021-08-05: qty 0.14

## 2021-08-05 NOTE — Progress Notes (Signed)
[name] OFFICE PROGRESS NOTE   Diagnosis: Thrombocytopenia, prostate cancer  INTERVAL HISTORY:   [name] returns as scheduled.  He continues weekly Nplate.  He began every 94-monthLupron 08/02/2021.  No hot flashes.  No increase in pain.  He denies bleeding.  He notes he is urinating more frequently.  Objective:  Vital signs in last 24 hours:  Blood pressure (!) 137/35, pulse 66, temperature 98.2 F (36.8 C), temperature source Oral, resp. rate 18, height '5\' 5"'  (1.651 m), weight 157 lb 12.8 oz (71.6 kg), SpO2 98 %.    Resp: Lungs clear bilaterally. Cardio: Regular rate and rhythm. GI: No hepatosplenomegaly. Vascular: No leg edema.  Lab Results:  Lab Results  Component Value Date   WBC 8.2 08/05/2021   HGB 10.7 (L) 08/05/2021   HCT 34.1 (L) 08/05/2021   MCV 82.0 08/05/2021   PLT 126 (L) 08/05/2021   NEUTROABS PENDING 08/05/2021    Imaging:  No results found.  Medications: I have reviewed the patient's current medications.  Assessment/Plan: Thrombocytopenia Bone marrow biopsy 07/08/2016-no evidence of B-cell lymphoma, 40% cellular bone marrow with trilineage hematopoiesis, megakaryocytes present with normal morphology, normal cytogenetics, negative for BRAF mutation Flow cytometry 01/05/2009-no monoclonal B-cell or phenotypically abnormal T-cell population Prednisone starting 06/29/2019 Nplate 07/04/2019, 51/75/1025 Nplate 07/18/2019 Prednisone 20 mg daily beginning 07/25/2019 Platelets 11,000 on 08/15/2019, prednisone increased to 40 mg daily, Nplate resume 68/52/7782 Prednisone decreased to 30 mg daily 08/25/2019, Nplate continued Prednisone continued at 30 mg daily 09/13/2019, weekly Nplate continued Prednisone taper to 20 mg daily 09/23/2019, weekly Nplate continued Prednisone taper to 10 mg daily 09/30/2019, weekly Nplate continued Prednisone taper to 5 mg daily 10/21/2019, weekly Nplate continued Prednisone taper to 5 mg every other day for 5 doses then  stop 11/11/2019, weekly Nplate continued Nplate last given 94/23/5361Promacta cost prohibitive Nplate beginning 64/06/3152 held 08/27/2020 and 09/03/2020 due to patient vacation Nplate resumed 70/08/6761Nplate held 89/5/0932secondary to an elevated platelet count Nplate resumed with dose reduction to 1 mcg/kg 10/09/2020 Nplate dose decreased to 0.5 mcg/kg 10/29/2020 Nplate dose increased to 1 mcg/kg 11/07/2020 Hairy cell leukemia 1982, status post splenectomy Coronary artery disease Aortic stenosis Liposarcoma at the right chest wall resected in 2013 Macular degeneration Hearing loss Basal cell carcinoma left cheek 05/24/2019 [facility], [facility] Status post T[facility] procedure 09/06/2019 Admission with Pseudomonas urosepsis 09/16/2019 Acute left MCA distribution CVA 09/16/2019 Covid January 2022 Mild anemia, 1 of 3 stool Hemoccult cards positive for occult blood 10/14/2020, ferritin low 06/29/2020 Virtual colonoscopy 06/25/2021 -5 mm polypoid defect in the ascending colon 15.  Prostate cancer-sclerotic bone lesions/prostatic hypertrophy on CT 06/25/2021; Lupron every 3 months beginning 08/02/2021, bicalutamide 08/02/2021 for 2 weeks    Disposition: Mr. SGoyerappears unchanged.  He has chronic thrombocytopenia, managed with weekly Nplate.  Plan to continue the same.  He was recently diagnosed with prostate cancer metastatic to bone.  He began every 365-monthupron 08/02/2021.  He has had no increase in pain.  He will continue bicalutamide for a total of 2 weeks.  Lab and Nplate on a weekly basis.  Follow-up appointment in 4 weeks.  He will contact the office in the interim with any problems.  Patient seen with Dr. ShBenay Spice   LiNed CardNP/GNP-BC   08/05/2021  10:49 AM  This was a shared visit with LiNed Card [name] the bicalutamide and leuprolide well.  The back pain  has improved.  I suspect the pain is related to prostate cancer as opposed to  a benign musculoskeletal condition.  He would like to see Dr. Tonita Cong for further evaluation.  I do not recommend an MRI unless Dr. Tonita Cong feels this is indicated.  He continues weekly Nplate.  I was present for greater than 50% of today's visit.  I performed medical decision making.  Julieanne Manson, MD

## 2021-08-05 NOTE — Patient Instructions (Signed)
Romiplostim injection ?What is this medication? ?ROMIPLOSTIM (roe mi PLOE stim) helps your body make more platelets. This medicine is used to treat low platelets caused by chronic idiopathic thrombocytopenic purpura (ITP) or a bone marrow syndrome caused by radiation sickness. ?This medicine may be used for other purposes; ask your health care provider or pharmacist if you have questions. ?COMMON BRAND NAME(S): Nplate ?What should I tell my care team before I take this medication? ?They need to know if you have any of these conditions: ?blood clots ?myelodysplastic syndrome ?an unusual or allergic reaction to romiplostim, mannitol, other medicines, foods, dyes, or preservatives ?pregnant or trying to get pregnant ?breast-feeding ?How should I use this medication? ?This medicine is injected under the skin. It is given by a health care provider in a hospital or clinic setting. ?A special MedGuide will be given to you before each treatment. Be sure to read this information carefully each time. ?Talk to your health care provider about the use of this medicine in children. While it may be prescribed for children as young as newborns for selected conditions, precautions do apply. ?Overdosage: If you think you have taken too much of this medicine contact a poison control center or emergency room at once. ?NOTE: This medicine is only for you. Do not share this medicine with others. ?What if I miss a dose? ?Keep appointments for follow-up doses. It is important not to miss your dose. Call your health care provider if you are unable to keep an appointment. ?What may interact with this medication? ?Interactions are not expected. ?This list may not describe all possible interactions. Give your health care provider a list of all the medicines, herbs, non-prescription drugs, or dietary supplements you use. Also tell them if you smoke, drink alcohol, or use illegal drugs. Some items may interact with your medicine. ?What should I  watch for while using this medication? ?Visit your health care provider for regular checks on your progress. You may need blood work done while you are taking this medicine. Your condition will be monitored carefully while you are receiving this medicine. It is important not to miss any appointments. ?What side effects may I notice from receiving this medication? ?Side effects that you should report to your doctor or health care professional as soon as possible: ?allergic reactions (skin rash, itching or hives; swelling of the face, lips, or tongue) ?bleeding (bloody or black, tarry stools; red or dark brown urine; spitting up blood or brown material that looks like coffee grounds; red spots on the skin; unusual bruising or bleeding from the eyes, gums, or nose) ?blood clot (chest pain; shortness of breath; pain, swelling, or warmth in the leg) ?stroke (changes in vision; confusion; trouble speaking or understanding; severe headaches; sudden numbness or weakness of the face, arm or leg; trouble walking; dizziness; loss of balance or coordination) ?Side effects that usually do not require medical attention (report to your doctor or health care professional if they continue or are bothersome): ?diarrhea ?dizziness ?headache ?joint pain ?muscle pain ?stomach pain ?trouble sleeping ?This list may not describe all possible side effects. Call your doctor for medical advice about side effects. You may report side effects to FDA at 1-800-FDA-1088. ?Where should I keep my medication? ?This medicine is given in a hospital or clinic. It will not be stored at home. ?NOTE: This sheet is a summary. It may not cover all possible information. If you have questions about this medicine, talk to your doctor, pharmacist, or health care provider. ??   2023 Elsevier/Gold Standard (2021-01-18 00:00:00) ? ?

## 2021-08-06 DIAGNOSIS — H35373 Puckering of macula, bilateral: Secondary | ICD-10-CM | POA: Diagnosis not present

## 2021-08-06 DIAGNOSIS — H4312 Vitreous hemorrhage, left eye: Secondary | ICD-10-CM | POA: Diagnosis not present

## 2021-08-06 DIAGNOSIS — H43821 Vitreomacular adhesion, right eye: Secondary | ICD-10-CM | POA: Diagnosis not present

## 2021-08-06 DIAGNOSIS — H35412 Lattice degeneration of retina, left eye: Secondary | ICD-10-CM | POA: Diagnosis not present

## 2021-08-06 DIAGNOSIS — H43812 Vitreous degeneration, left eye: Secondary | ICD-10-CM | POA: Diagnosis not present

## 2021-08-06 DIAGNOSIS — H353111 Nonexudative age-related macular degeneration, right eye, early dry stage: Secondary | ICD-10-CM | POA: Diagnosis not present

## 2021-08-06 DIAGNOSIS — H353221 Exudative age-related macular degeneration, left eye, with active choroidal neovascularization: Secondary | ICD-10-CM | POA: Diagnosis not present

## 2021-08-12 ENCOUNTER — Inpatient Hospital Stay: Payer: Medicare Other

## 2021-08-12 ENCOUNTER — Other Ambulatory Visit: Payer: Self-pay

## 2021-08-12 VITALS — BP 136/64 | HR 61 | Temp 98.0°F | Resp 20 | Ht 65.0 in | Wt 159.1 lb

## 2021-08-12 DIAGNOSIS — D693 Immune thrombocytopenic purpura: Secondary | ICD-10-CM

## 2021-08-12 DIAGNOSIS — C7951 Secondary malignant neoplasm of bone: Secondary | ICD-10-CM | POA: Diagnosis not present

## 2021-08-12 DIAGNOSIS — C61 Malignant neoplasm of prostate: Secondary | ICD-10-CM

## 2021-08-12 DIAGNOSIS — D696 Thrombocytopenia, unspecified: Secondary | ICD-10-CM | POA: Diagnosis not present

## 2021-08-12 DIAGNOSIS — Z5111 Encounter for antineoplastic chemotherapy: Secondary | ICD-10-CM | POA: Diagnosis not present

## 2021-08-12 LAB — CBC WITH DIFFERENTIAL (CANCER CENTER ONLY)
Abs Immature Granulocytes: 0.6 10*3/uL — ABNORMAL HIGH (ref 0.00–0.07)
Basophils Absolute: 0.1 10*3/uL (ref 0.0–0.1)
Basophils Relative: 1 %
Eosinophils Absolute: 0.2 10*3/uL (ref 0.0–0.5)
Eosinophils Relative: 2 %
HCT: 34.1 % — ABNORMAL LOW (ref 39.0–52.0)
Hemoglobin: 10.5 g/dL — ABNORMAL LOW (ref 13.0–17.0)
Immature Granulocytes: 7 %
Lymphocytes Relative: 14 %
Lymphs Abs: 1.2 10*3/uL (ref 0.7–4.0)
MCH: 25.6 pg — ABNORMAL LOW (ref 26.0–34.0)
MCHC: 30.8 g/dL (ref 30.0–36.0)
MCV: 83.2 fL (ref 80.0–100.0)
Monocytes Absolute: 1.7 10*3/uL — ABNORMAL HIGH (ref 0.1–1.0)
Monocytes Relative: 19 %
Neutro Abs: 4.9 10*3/uL (ref 1.7–7.7)
Neutrophils Relative %: 57 %
Platelet Count: 155 10*3/uL (ref 150–400)
RBC: 4.1 MIL/uL — ABNORMAL LOW (ref 4.22–5.81)
RDW: 18.2 % — ABNORMAL HIGH (ref 11.5–15.5)
WBC Count: 8.7 10*3/uL (ref 4.0–10.5)
nRBC: 0.2 % (ref 0.0–0.2)

## 2021-08-12 MED ORDER — ROMIPLOSTIM 125 MCG ~~LOC~~ SOLR
1.0000 ug/kg | Freq: Once | SUBCUTANEOUS | Status: AC
  Administered 2021-08-12: 70 ug via SUBCUTANEOUS
  Filled 2021-08-12: qty 0.14

## 2021-08-12 NOTE — Patient Instructions (Signed)
Romiplostim injection ?What is this medication? ?ROMIPLOSTIM (roe mi PLOE stim) helps your body make more platelets. This medicine is used to treat low platelets caused by chronic idiopathic thrombocytopenic purpura (ITP) or a bone marrow syndrome caused by radiation sickness. ?This medicine may be used for other purposes; ask your health care provider or pharmacist if you have questions. ?COMMON BRAND NAME(S): Nplate ?What should I tell my care team before I take this medication? ?They need to know if you have any of these conditions: ?blood clots ?myelodysplastic syndrome ?an unusual or allergic reaction to romiplostim, mannitol, other medicines, foods, dyes, or preservatives ?pregnant or trying to get pregnant ?breast-feeding ?How should I use this medication? ?This medicine is injected under the skin. It is given by a health care provider in a hospital or clinic setting. ?A special MedGuide will be given to you before each treatment. Be sure to read this information carefully each time. ?Talk to your health care provider about the use of this medicine in children. While it may be prescribed for children as young as newborns for selected conditions, precautions do apply. ?Overdosage: If you think you have taken too much of this medicine contact a poison control center or emergency room at once. ?NOTE: This medicine is only for you. Do not share this medicine with others. ?What if I miss a dose? ?Keep appointments for follow-up doses. It is important not to miss your dose. Call your health care provider if you are unable to keep an appointment. ?What may interact with this medication? ?Interactions are not expected. ?This list may not describe all possible interactions. Give your health care provider a list of all the medicines, herbs, non-prescription drugs, or dietary supplements you use. Also tell them if you smoke, drink alcohol, or use illegal drugs. Some items may interact with your medicine. ?What should I  watch for while using this medication? ?Visit your health care provider for regular checks on your progress. You may need blood work done while you are taking this medicine. Your condition will be monitored carefully while you are receiving this medicine. It is important not to miss any appointments. ?What side effects may I notice from receiving this medication? ?Side effects that you should report to your doctor or health care professional as soon as possible: ?allergic reactions (skin rash, itching or hives; swelling of the face, lips, or tongue) ?bleeding (bloody or black, tarry stools; red or dark brown urine; spitting up blood or brown material that looks like coffee grounds; red spots on the skin; unusual bruising or bleeding from the eyes, gums, or nose) ?blood clot (chest pain; shortness of breath; pain, swelling, or warmth in the leg) ?stroke (changes in vision; confusion; trouble speaking or understanding; severe headaches; sudden numbness or weakness of the face, arm or leg; trouble walking; dizziness; loss of balance or coordination) ?Side effects that usually do not require medical attention (report to your doctor or health care professional if they continue or are bothersome): ?diarrhea ?dizziness ?headache ?joint pain ?muscle pain ?stomach pain ?trouble sleeping ?This list may not describe all possible side effects. Call your doctor for medical advice about side effects. You may report side effects to FDA at 1-800-FDA-1088. ?Where should I keep my medication? ?This medicine is given in a hospital or clinic. It will not be stored at home. ?NOTE: This sheet is a summary. It may not cover all possible information. If you have questions about this medicine, talk to your doctor, pharmacist, or health care provider. ??   2023 Elsevier/Gold Standard (2021-01-18 00:00:00) ? ?

## 2021-08-14 DIAGNOSIS — H401431 Capsular glaucoma with pseudoexfoliation of lens, bilateral, mild stage: Secondary | ICD-10-CM | POA: Diagnosis not present

## 2021-08-15 DIAGNOSIS — M5136 Other intervertebral disc degeneration, lumbar region: Secondary | ICD-10-CM | POA: Diagnosis not present

## 2021-08-15 DIAGNOSIS — M545 Low back pain, unspecified: Secondary | ICD-10-CM | POA: Insufficient documentation

## 2021-08-15 DIAGNOSIS — M5451 Vertebrogenic low back pain: Secondary | ICD-10-CM | POA: Diagnosis not present

## 2021-08-19 ENCOUNTER — Inpatient Hospital Stay: Payer: Medicare Other

## 2021-08-19 ENCOUNTER — Other Ambulatory Visit: Payer: Self-pay

## 2021-08-19 VITALS — BP 138/63 | HR 61 | Temp 98.1°F | Resp 18 | Ht 65.0 in | Wt 159.0 lb

## 2021-08-19 DIAGNOSIS — D696 Thrombocytopenia, unspecified: Secondary | ICD-10-CM | POA: Diagnosis not present

## 2021-08-19 DIAGNOSIS — D693 Immune thrombocytopenic purpura: Secondary | ICD-10-CM

## 2021-08-19 DIAGNOSIS — Z5111 Encounter for antineoplastic chemotherapy: Secondary | ICD-10-CM | POA: Diagnosis not present

## 2021-08-19 DIAGNOSIS — C61 Malignant neoplasm of prostate: Secondary | ICD-10-CM

## 2021-08-19 DIAGNOSIS — C7951 Secondary malignant neoplasm of bone: Secondary | ICD-10-CM | POA: Diagnosis not present

## 2021-08-19 LAB — CBC WITH DIFFERENTIAL (CANCER CENTER ONLY)
Abs Immature Granulocytes: 0.46 10*3/uL — ABNORMAL HIGH (ref 0.00–0.07)
Basophils Absolute: 0.1 10*3/uL (ref 0.0–0.1)
Basophils Relative: 1 %
Eosinophils Absolute: 0.2 10*3/uL (ref 0.0–0.5)
Eosinophils Relative: 2 %
HCT: 33.6 % — ABNORMAL LOW (ref 39.0–52.0)
Hemoglobin: 10.6 g/dL — ABNORMAL LOW (ref 13.0–17.0)
Immature Granulocytes: 6 %
Lymphocytes Relative: 16 %
Lymphs Abs: 1.2 10*3/uL (ref 0.7–4.0)
MCH: 26 pg (ref 26.0–34.0)
MCHC: 31.5 g/dL (ref 30.0–36.0)
MCV: 82.6 fL (ref 80.0–100.0)
Monocytes Absolute: 1.6 10*3/uL — ABNORMAL HIGH (ref 0.1–1.0)
Monocytes Relative: 20 %
Neutro Abs: 4.1 10*3/uL (ref 1.7–7.7)
Neutrophils Relative %: 55 %
Platelet Count: 92 10*3/uL — ABNORMAL LOW (ref 150–400)
RBC: 4.07 MIL/uL — ABNORMAL LOW (ref 4.22–5.81)
RDW: 18.8 % — ABNORMAL HIGH (ref 11.5–15.5)
WBC Count: 7.6 10*3/uL (ref 4.0–10.5)
nRBC: 0.4 % — ABNORMAL HIGH (ref 0.0–0.2)

## 2021-08-19 MED ORDER — ROMIPLOSTIM 125 MCG ~~LOC~~ SOLR
1.0000 ug/kg | Freq: Once | SUBCUTANEOUS | Status: AC
  Administered 2021-08-19: 70 ug via SUBCUTANEOUS
  Filled 2021-08-19: qty 0.14

## 2021-08-19 NOTE — Patient Instructions (Signed)
Romiplostim injection ?What is this medication? ?ROMIPLOSTIM (roe mi PLOE stim) helps your body make more platelets. This medicine is used to treat low platelets caused by chronic idiopathic thrombocytopenic purpura (ITP) or a bone marrow syndrome caused by radiation sickness. ?This medicine may be used for other purposes; ask your health care provider or pharmacist if you have questions. ?COMMON BRAND NAME(S): Nplate ?What should I tell my care team before I take this medication? ?They need to know if you have any of these conditions: ?blood clots ?myelodysplastic syndrome ?an unusual or allergic reaction to romiplostim, mannitol, other medicines, foods, dyes, or preservatives ?pregnant or trying to get pregnant ?breast-feeding ?How should I use this medication? ?This medicine is injected under the skin. It is given by a health care provider in a hospital or clinic setting. ?A special MedGuide will be given to you before each treatment. Be sure to read this information carefully each time. ?Talk to your health care provider about the use of this medicine in children. While it may be prescribed for children as young as newborns for selected conditions, precautions do apply. ?Overdosage: If you think you have taken too much of this medicine contact a poison control center or emergency room at once. ?NOTE: This medicine is only for you. Do not share this medicine with others. ?What if I miss a dose? ?Keep appointments for follow-up doses. It is important not to miss your dose. Call your health care provider if you are unable to keep an appointment. ?What may interact with this medication? ?Interactions are not expected. ?This list may not describe all possible interactions. Give your health care provider a list of all the medicines, herbs, non-prescription drugs, or dietary supplements you use. Also tell them if you smoke, drink alcohol, or use illegal drugs. Some items may interact with your medicine. ?What should I  watch for while using this medication? ?Visit your health care provider for regular checks on your progress. You may need blood work done while you are taking this medicine. Your condition will be monitored carefully while you are receiving this medicine. It is important not to miss any appointments. ?What side effects may I notice from receiving this medication? ?Side effects that you should report to your doctor or health care professional as soon as possible: ?allergic reactions (skin rash, itching or hives; swelling of the face, lips, or tongue) ?bleeding (bloody or black, tarry stools; red or dark brown urine; spitting up blood or brown material that looks like coffee grounds; red spots on the skin; unusual bruising or bleeding from the eyes, gums, or nose) ?blood clot (chest pain; shortness of breath; pain, swelling, or warmth in the leg) ?stroke (changes in vision; confusion; trouble speaking or understanding; severe headaches; sudden numbness or weakness of the face, arm or leg; trouble walking; dizziness; loss of balance or coordination) ?Side effects that usually do not require medical attention (report to your doctor or health care professional if they continue or are bothersome): ?diarrhea ?dizziness ?headache ?joint pain ?muscle pain ?stomach pain ?trouble sleeping ?This list may not describe all possible side effects. Call your doctor for medical advice about side effects. You may report side effects to FDA at 1-800-FDA-1088. ?Where should I keep my medication? ?This medicine is given in a hospital or clinic. It will not be stored at home. ?NOTE: This sheet is a summary. It may not cover all possible information. If you have questions about this medicine, talk to your doctor, pharmacist, or health care provider. ??   2023 Elsevier/Gold Standard (2021-01-18 00:00:00) ? ?

## 2021-08-20 ENCOUNTER — Other Ambulatory Visit: Payer: Self-pay | Admitting: *Deleted

## 2021-08-20 MED ORDER — PANTOPRAZOLE SODIUM 40 MG PO TBEC: 40.0000 mg | DELAYED_RELEASE_TABLET | Freq: Every day | ORAL | 3 refills | Status: AC

## 2021-08-22 ENCOUNTER — Telehealth: Payer: Self-pay

## 2021-08-22 NOTE — Telephone Encounter (Signed)
TC from [place] with [facility] [phone] requesting documentation showing Pt received N plate injection on 04/05/31. Pt scheduled to receive injection there on Monday 08/26/21 Documentation faxed to [phone]. Clarise Cruz returned call stating she received fax.

## 2021-08-26 ENCOUNTER — Inpatient Hospital Stay: Payer: Medicare Other

## 2021-08-26 DIAGNOSIS — D693 Immune thrombocytopenic purpura: Secondary | ICD-10-CM | POA: Diagnosis not present

## 2021-08-26 DIAGNOSIS — Z79899 Other long term (current) drug therapy: Secondary | ICD-10-CM | POA: Diagnosis not present

## 2021-09-02 ENCOUNTER — Inpatient Hospital Stay: Payer: Medicare Other | Admitting: Oncology

## 2021-09-02 ENCOUNTER — Inpatient Hospital Stay: Payer: Medicare Other

## 2021-09-02 DIAGNOSIS — D693 Immune thrombocytopenic purpura: Secondary | ICD-10-CM | POA: Diagnosis not present

## 2021-09-02 DIAGNOSIS — Z79899 Other long term (current) drug therapy: Secondary | ICD-10-CM | POA: Diagnosis not present

## 2021-09-09 DIAGNOSIS — D693 Immune thrombocytopenic purpura: Secondary | ICD-10-CM | POA: Diagnosis not present

## 2021-09-09 DIAGNOSIS — Z79899 Other long term (current) drug therapy: Secondary | ICD-10-CM | POA: Diagnosis not present

## 2021-09-10 DIAGNOSIS — H353221 Exudative age-related macular degeneration, left eye, with active choroidal neovascularization: Secondary | ICD-10-CM | POA: Diagnosis not present

## 2021-09-16 ENCOUNTER — Inpatient Hospital Stay: Payer: Medicare Other | Attending: Nurse Practitioner

## 2021-09-16 ENCOUNTER — Inpatient Hospital Stay: Payer: Medicare Other

## 2021-09-16 VITALS — BP 155/69 | HR 58 | Temp 97.1°F | Resp 18 | Ht 65.0 in | Wt 160.0 lb

## 2021-09-16 DIAGNOSIS — C61 Malignant neoplasm of prostate: Secondary | ICD-10-CM

## 2021-09-16 DIAGNOSIS — C7951 Secondary malignant neoplasm of bone: Secondary | ICD-10-CM | POA: Insufficient documentation

## 2021-09-16 DIAGNOSIS — D696 Thrombocytopenia, unspecified: Secondary | ICD-10-CM | POA: Insufficient documentation

## 2021-09-16 DIAGNOSIS — D693 Immune thrombocytopenic purpura: Secondary | ICD-10-CM

## 2021-09-16 LAB — CBC WITH DIFFERENTIAL (CANCER CENTER ONLY)
Abs Immature Granulocytes: 0.4 10*3/uL — ABNORMAL HIGH (ref 0.00–0.07)
Basophils Absolute: 0.1 10*3/uL (ref 0.0–0.1)
Basophils Relative: 2 %
Eosinophils Absolute: 0.1 10*3/uL (ref 0.0–0.5)
Eosinophils Relative: 2 %
HCT: 32 % — ABNORMAL LOW (ref 39.0–52.0)
Hemoglobin: 10 g/dL — ABNORMAL LOW (ref 13.0–17.0)
Lymphocytes Relative: 22 %
Lymphs Abs: 1.6 10*3/uL (ref 0.7–4.0)
MCH: 25.9 pg — ABNORMAL LOW (ref 26.0–34.0)
MCHC: 31.3 g/dL (ref 30.0–36.0)
MCV: 82.9 fL (ref 80.0–100.0)
Monocytes Absolute: 0.5 10*3/uL (ref 0.1–1.0)
Monocytes Relative: 7 %
Myelocytes: 5 %
Neutro Abs: 4.5 10*3/uL (ref 1.7–7.7)
Neutrophils Relative %: 62 %
Platelet Count: 111 10*3/uL — ABNORMAL LOW (ref 150–400)
RBC: 3.86 MIL/uL — ABNORMAL LOW (ref 4.22–5.81)
RDW: 19.8 % — ABNORMAL HIGH (ref 11.5–15.5)
Smear Review: DECREASED
WBC Count: 7.3 10*3/uL (ref 4.0–10.5)
nRBC: 0 % (ref 0.0–0.2)

## 2021-09-16 MED ORDER — ROMIPLOSTIM 125 MCG ~~LOC~~ SOLR
1.0000 ug/kg | Freq: Once | SUBCUTANEOUS | Status: AC
  Administered 2021-09-16: 75 ug via SUBCUTANEOUS
  Filled 2021-09-16: qty 0.15

## 2021-09-16 NOTE — Addendum Note (Signed)
Addended by: Gardiner Rhyme on: 09/16/2021 01:04 PM   Modules accepted: Orders

## 2021-09-16 NOTE — Progress Notes (Signed)
Patient bumped his head on kitchen cabinet on Saturday and came in today for Nplate.  [name], NP came to examine the bump on his head and advised him to apply ice and to let us know if the area grows in size or starts to show any active bleeding.  Gardiner Rhyme, RN

## 2021-09-17 ENCOUNTER — Telehealth: Payer: Self-pay | Admitting: *Deleted

## 2021-09-17 NOTE — Chronic Care Management (AMB) (Signed)
  Care Coordination  Note  09/17/2021 Name: [name] MRN: [id number] DOB: 09-May-1923  [name] is a 88 y.o. year old male who is a primary care patient of [name], MD. I reached out to [name] by phone today to offer care coordination services.      [name] was given information about Care Coordination services today including:  The Care Coordination services include support from the care team which includes your Nurse Coordinator, Clinical Social Worker, or Pharmacist.  The Care Coordination team is here to help remove barriers to the health concerns and goals most important to you. Care Coordination services are voluntary and the patient may decline or stop services at any time by request to their care team member.   Patient agreed to services and verbal consent obtained.   Follow up plan: Telephone appointment with care coordination team member scheduled for:09/19/21  [place]  Direct Dial: [phone]

## 2021-09-18 LAB — PROSTATE-SPECIFIC AG, SERUM (LABCORP): Prostate Specific Ag, Serum: 246 ng/mL — ABNORMAL HIGH (ref 0.0–4.0)

## 2021-09-19 ENCOUNTER — Ambulatory Visit: Payer: Self-pay

## 2021-09-19 ENCOUNTER — Telehealth: Payer: Self-pay | Admitting: Interventional Cardiology

## 2021-09-19 NOTE — Patient Instructions (Signed)
Visit Information  Thank you for taking time to visit with me today. Please don't hesitate to contact me if I can be of assistance to you.   Following are the goals we discussed today:   Goals Addressed       Patient Stated     "to determine why I am still having shortness of breath" (pt-stated)        Care Coordination Interventions: Evaluation of current treatment plan related to dyspnea and patient's adherence to plan as established by provider Collaborated with Dr. Daneen Schick, Cardiologist regarding patient's ongoing complaints of shortness of breath  Advised patient to discuss new symptoms or concerns with provider         Our next appointment is by telephone on 10/01/21 at 12:30 PM   Please call the care guide team at [phone] if you need to cancel or reschedule your appointment.   If you are experiencing a Mental Health or [place] or need someone to talk to, please call 1-800-273-TALK (toll free, 24 hour hotline)   Patient verbalizes understanding of instructions and care plan provided today and agrees to view in [place]. Active MyChart status and patient understanding of how to access instructions and care plan via MyChart confirmed with patient.     Telephone follow up appointment with care management team member scheduled for:  Barb Merino, RN, BSN, CCM Care Management Coordinator [place] [facility]  Direct Phone: [phone]

## 2021-09-19 NOTE — Progress Notes (Signed)
This encounter was created in error - please disregard.  This encounter was created in error - please disregard.

## 2021-09-19 NOTE — Telephone Encounter (Signed)
  Pt c/o Shortness Of Breath: STAT if SOB developed within the last 24 hours or pt is noticeably SOB on the phone  1. Are you currently SOB (can you hear that pt is SOB on the phone)? No   2. How long have you been experiencing SOB? Chronic sob   3. Are you SOB when sitting or when up moving around? Moving around   4. Are you currently experiencing any other symptoms?   Angel case Physiological scientist with Dr. Shon Baton office calling, she would like to let Dr Tamala Julian know that pt still having SOB. During pt's last visit Dr. Tamala Julian prescribed farxiga to help with SOB and per pt it didn't help with sob so he stopped taking it. She said, Dr. Thompson Caul nurse can call her back for recommendations

## 2021-09-20 NOTE — Patient Outreach (Signed)
  Care Coordination   Initial Visit Note   09/19/2021 Name: [name] MRN: [id number] DOB: Aug 08, 1923  [name] is a 88 y.o. year old male who sees [name], MD for primary care. I spoke with  [name] by phone today  What matters to the patients health and wellness today?  To have less shortness of breath    Goals Addressed     Patient Stated     "to determine why I am still having shortness of breath" (pt-stated)        Care Coordination Interventions: Evaluation of current treatment plan related to dyspnea and patient's adherence to plan as established by provider Collaborated with Dr. Daneen Schick, Cardiologist regarding patient's ongoing complaints of shortness of breath  Advised patient to discuss new symptoms or concerns with provider     SDOH assessments and interventions completed:   Yes   Care Coordination Interventions Activated:  Yes Care Coordination Interventions:  Yes, provided  Follow up plan: Follow up call scheduled for 10/01/21  Encounter Outcome:  Pt. Visit Completed

## 2021-09-23 ENCOUNTER — Inpatient Hospital Stay: Payer: Medicare Other

## 2021-09-23 ENCOUNTER — Inpatient Hospital Stay (HOSPITAL_BASED_OUTPATIENT_CLINIC_OR_DEPARTMENT_OTHER): Payer: Medicare Other | Admitting: Oncology

## 2021-09-23 ENCOUNTER — Telehealth: Payer: Self-pay

## 2021-09-23 VITALS — BP 118/58 | HR 83 | Temp 98.1°F | Resp 18 | Ht 65.0 in | Wt 159.8 lb

## 2021-09-23 DIAGNOSIS — D649 Anemia, unspecified: Secondary | ICD-10-CM

## 2021-09-23 DIAGNOSIS — D696 Thrombocytopenia, unspecified: Secondary | ICD-10-CM | POA: Diagnosis not present

## 2021-09-23 DIAGNOSIS — C61 Malignant neoplasm of prostate: Secondary | ICD-10-CM

## 2021-09-23 DIAGNOSIS — D693 Immune thrombocytopenic purpura: Secondary | ICD-10-CM

## 2021-09-23 DIAGNOSIS — C7951 Secondary malignant neoplasm of bone: Secondary | ICD-10-CM

## 2021-09-23 LAB — CBC WITH DIFFERENTIAL (CANCER CENTER ONLY)
Abs Immature Granulocytes: 0.1 10*3/uL — ABNORMAL HIGH (ref 0.00–0.07)
Basophils Absolute: 0 10*3/uL (ref 0.0–0.1)
Basophils Relative: 0 %
Eosinophils Absolute: 0.1 10*3/uL (ref 0.0–0.5)
Eosinophils Relative: 1 %
HCT: 32.2 % — ABNORMAL LOW (ref 39.0–52.0)
Hemoglobin: 10.2 g/dL — ABNORMAL LOW (ref 13.0–17.0)
Lymphocytes Relative: 20 %
Lymphs Abs: 1.5 10*3/uL (ref 0.7–4.0)
MCH: 26.4 pg (ref 26.0–34.0)
MCHC: 31.7 g/dL (ref 30.0–36.0)
MCV: 83.4 fL (ref 80.0–100.0)
Metamyelocytes Relative: 2 %
Monocytes Absolute: 1.2 10*3/uL — ABNORMAL HIGH (ref 0.1–1.0)
Monocytes Relative: 17 %
Neutro Abs: 4.4 10*3/uL (ref 1.7–7.7)
Neutrophils Relative %: 60 %
Platelet Count: 51 10*3/uL — ABNORMAL LOW (ref 150–400)
RBC: 3.86 MIL/uL — ABNORMAL LOW (ref 4.22–5.81)
RDW: 19.6 % — ABNORMAL HIGH (ref 11.5–15.5)
WBC Count: 7.3 10*3/uL (ref 4.0–10.5)
nRBC: 0.3 % — ABNORMAL HIGH (ref 0.0–0.2)

## 2021-09-23 MED ORDER — ROMIPLOSTIM 125 MCG ~~LOC~~ SOLR
1.0000 ug/kg | Freq: Once | SUBCUTANEOUS | Status: AC
  Administered 2021-09-23: 75 ug via SUBCUTANEOUS
  Filled 2021-09-23: qty 0.15

## 2021-09-23 NOTE — Patient Instructions (Signed)
Romiplostim injection ?What is this medication? ?ROMIPLOSTIM (roe mi PLOE stim) helps your body make more platelets. This medicine is used to treat low platelets caused by chronic idiopathic thrombocytopenic purpura (ITP) or a bone marrow syndrome caused by radiation sickness. ?This medicine may be used for other purposes; ask your health care provider or pharmacist if you have questions. ?COMMON BRAND NAME(S): Nplate ?What should I tell my care team before I take this medication? ?They need to know if you have any of these conditions: ?blood clots ?myelodysplastic syndrome ?an unusual or allergic reaction to romiplostim, mannitol, other medicines, foods, dyes, or preservatives ?pregnant or trying to get pregnant ?breast-feeding ?How should I use this medication? ?This medicine is injected under the skin. It is given by a health care provider in a hospital or clinic setting. ?A special MedGuide will be given to you before each treatment. Be sure to read this information carefully each time. ?Talk to your health care provider about the use of this medicine in children. While it may be prescribed for children as young as newborns for selected conditions, precautions do apply. ?Overdosage: If you think you have taken too much of this medicine contact a poison control center or emergency room at once. ?NOTE: This medicine is only for you. Do not share this medicine with others. ?What if I miss a dose? ?Keep appointments for follow-up doses. It is important not to miss your dose. Call your health care provider if you are unable to keep an appointment. ?What may interact with this medication? ?Interactions are not expected. ?This list may not describe all possible interactions. Give your health care provider a list of all the medicines, herbs, non-prescription drugs, or dietary supplements you use. Also tell them if you smoke, drink alcohol, or use illegal drugs. Some items may interact with your medicine. ?What should I  watch for while using this medication? ?Visit your health care provider for regular checks on your progress. You may need blood work done while you are taking this medicine. Your condition will be monitored carefully while you are receiving this medicine. It is important not to miss any appointments. ?What side effects may I notice from receiving this medication? ?Side effects that you should report to your doctor or health care professional as soon as possible: ?allergic reactions (skin rash, itching or hives; swelling of the face, lips, or tongue) ?bleeding (bloody or black, tarry stools; red or dark brown urine; spitting up blood or brown material that looks like coffee grounds; red spots on the skin; unusual bruising or bleeding from the eyes, gums, or nose) ?blood clot (chest pain; shortness of breath; pain, swelling, or warmth in the leg) ?stroke (changes in vision; confusion; trouble speaking or understanding; severe headaches; sudden numbness or weakness of the face, arm or leg; trouble walking; dizziness; loss of balance or coordination) ?Side effects that usually do not require medical attention (report to your doctor or health care professional if they continue or are bothersome): ?diarrhea ?dizziness ?headache ?joint pain ?muscle pain ?stomach pain ?trouble sleeping ?This list may not describe all possible side effects. Call your doctor for medical advice about side effects. You may report side effects to FDA at 1-800-FDA-1088. ?Where should I keep my medication? ?This medicine is given in a hospital or clinic. It will not be stored at home. ?NOTE: This sheet is a summary. It may not cover all possible information. If you have questions about this medicine, talk to your doctor, pharmacist, or health care provider. ??   2023 Elsevier/Gold Standard (2021-01-18 00:00:00) ? ?

## 2021-09-23 NOTE — Patient Outreach (Signed)
  Care Coordination   Follow Up Visit Note   09/23/2021 Name: DARYLE AMIS MRN: [id number] DOB: 1923/08/12  REY DANSBY is a 88 y.o. year old male who sees [name], MD for primary care. I engaged with Alyse Low RN, with Dr. Daneen Schick, Cardiologist in the providers office today.  What matters to the patients health and wellness today?     Goals Addressed   None     SDOH assessments and interventions completed:   No   Care Coordination Interventions Activated:  Yes Care Coordination Interventions:  Yes, provided  Follow up plan: Follow up call scheduled for 10/01/21  Encounter Outcome:  Pt. Visit Completed

## 2021-09-23 NOTE — Progress Notes (Signed)
[place] OFFICE PROGRESS NOTE   Diagnosis: Anemia, thrombocytopenia  INTERVAL HISTORY:   [name] returns as scheduled.  [place] the beach for [place].  He received Nplate while at the beach.  He reports feeling well since receiving the Lupron injection 08/02/2021.  He no longer has back pain.  No hot flashes.  He complains of difficulty with recalling names.   Objective:  Vital signs in last 24 hours:  Blood pressure (!) 118/58, pulse 83, temperature 98.1 F (36.7 C), temperature source Oral, resp. rate 18, height _0  (1.651 m), weight 159 lb 12.8 oz (72.5 kg), SpO2 100 %.    Resp: End inspiratory rales at the right posterior base, no respiratory distress Cardio: Regular rate and rhythm GI: No hepatomegaly Vascular: No leg edema Neuro: Alert and oriented, ambulated to the exam table without difficulty    Lab Results:  Lab Results  Component Value Date   WBC 7.3 09/23/2021   HGB 10.2 (L) 09/23/2021   HCT 32.2 (L) 09/23/2021   MCV 83.4 09/23/2021   PLT 51 (L) 09/23/2021   NEUTROABS 4.4 09/23/2021    CMP  Lab Results  Component Value Date   NA 138 07/31/2021   K 4.0 07/31/2021   CL 104 07/31/2021   CO2 20 07/31/2021   GLUCOSE 145 (H) 07/31/2021   BUN 19 07/31/2021   CREATININE 1.38 (H) 07/31/2021   CALCIUM 8.3 (L) 07/31/2021   PROT 6.5 08/20/2020   ALBUMIN 3.9 08/20/2020   AST 18 08/20/2020   ALT 14 08/20/2020   ALKPHOS 106 08/20/2020   BILITOT 0.4 08/20/2020   GFRNONAA 52 (L) 08/20/2020   GFRAA 59 (L) 10/31/2019     Medications: I have reviewed the patient's current medications.   Assessment/Plan: Thrombocytopenia Bone marrow biopsy 07/08/2016-no evidence of B-cell lymphoma, 40% cellular bone marrow with trilineage hematopoiesis, megakaryocytes present with normal morphology, normal cytogenetics, negative for BRAF mutation Flow cytometry 01/05/2009-no monoclonal B-cell or phenotypically abnormal T-cell population Prednisone  starting 06/29/2019 Nplate 07/04/2019, 6/38/7564, Nplate 07/18/2019 Prednisone 20 mg daily beginning 07/25/2019 Platelets 11,000 on 08/15/2019, prednisone increased to 40 mg daily, Nplate resume 3/32/9518  Prednisone decreased to 30 mg daily 08/25/2019, Nplate continued Prednisone continued at 30 mg daily 09/13/2019, weekly Nplate continued Prednisone taper to 20 mg daily 09/23/2019, weekly Nplate continued Prednisone taper to 10 mg daily 09/30/2019, weekly Nplate continued Prednisone taper to 5 mg daily 10/21/2019, weekly Nplate continued Prednisone taper to 5 mg every other day for 5 doses then stop 11/11/2019, weekly Nplate continued Nplate last given 8/41/6606 Promacta cost prohibitive Nplate beginning 3/0/1601, held 08/27/2020 and 09/03/2020 due to patient vacation Nplate resumed 0/93/2355 Nplate held 09/02/2200 secondary to an elevated platelet count Nplate resumed with dose reduction to 1 mcg/kg 10/09/2020 Nplate dose decreased to 0.5 mcg/kg 10/29/2020 Nplate dose increased to 1 mcg/kg 11/07/2020 Hairy cell leukemia 1982, status post splenectomy Coronary artery disease Aortic stenosis Liposarcoma at the right chest wall resected in 2013 Macular degeneration Hearing loss Basal cell carcinoma left cheek 05/24/2019 Left upper lobe nodule, faint FDG activity on PET at [facility] 05/31/2013 Status post TAVR procedure 09/06/2019 Admission with Pseudomonas urosepsis 09/16/2019 Acute left MCA distribution CVA 09/16/2019 Covid January 2022 Mild anemia, 1 of 3 stool Hemoccult cards positive for occult blood 10/14/2020, ferritin low 06/29/2020 Virtual colonoscopy 06/25/2021 -5 mm polypoid defect in the ascending colon 15.  Prostate cancer-sclerotic bone lesions/prostatic hypertrophy on CT 06/25/2021; Lupron every 3 months beginning 08/02/2021, bicalutamide 08/02/2021 for 2  weeks     Disposition: [name] appears stable.  He has persistent thrombocytopenia, likely secondary to chronic ITP.  He will continue Nplate.  We will  check a CBC when he returns next week.  He has metastatic prostate cancer and completed 1 treatment with Lupron.  The PSA was lower last week.  He will return for an office visit and Lupron injection on 10/21/2021.  Douglas Coder, MD  09/23/2021  11:36 AM

## 2021-09-23 NOTE — Telephone Encounter (Signed)
Notified [name], case manager, that Dr. Tamala Julian said OK to stop [place].   Spoke with patient, he states he continues to have SOB with activity. Denies SOB at rest or when lying down, denies swelling. Patient states he does not notice any difference in SOB on Farxiga vs off of SGLT-2 therapy.  Discussed with Dr. Tamala Julian, no changes at this time. Advised patient to call our office if he experiences swelling or increased SOB (especially at rest or when lying down). Patient verbalized understanding and expressed appreciation for call.

## 2021-09-24 ENCOUNTER — Other Ambulatory Visit: Payer: Self-pay | Admitting: *Deleted

## 2021-09-24 DIAGNOSIS — D693 Immune thrombocytopenic purpura: Secondary | ICD-10-CM

## 2021-09-30 ENCOUNTER — Ambulatory Visit: Payer: Medicare Other

## 2021-09-30 ENCOUNTER — Telehealth: Payer: Self-pay

## 2021-09-30 ENCOUNTER — Inpatient Hospital Stay: Payer: Medicare Other

## 2021-09-30 ENCOUNTER — Encounter: Payer: Self-pay | Admitting: Oncology

## 2021-09-30 VITALS — BP 139/64 | HR 59 | Temp 98.2°F | Resp 18 | Ht 65.0 in | Wt 158.8 lb

## 2021-09-30 DIAGNOSIS — C7951 Secondary malignant neoplasm of bone: Secondary | ICD-10-CM

## 2021-09-30 DIAGNOSIS — D696 Thrombocytopenia, unspecified: Secondary | ICD-10-CM | POA: Diagnosis not present

## 2021-09-30 DIAGNOSIS — C61 Malignant neoplasm of prostate: Secondary | ICD-10-CM | POA: Diagnosis not present

## 2021-09-30 DIAGNOSIS — D693 Immune thrombocytopenic purpura: Secondary | ICD-10-CM

## 2021-09-30 DIAGNOSIS — I480 Paroxysmal atrial fibrillation: Secondary | ICD-10-CM | POA: Diagnosis not present

## 2021-09-30 DIAGNOSIS — R0981 Nasal congestion: Secondary | ICD-10-CM | POA: Diagnosis not present

## 2021-09-30 DIAGNOSIS — R0602 Shortness of breath: Secondary | ICD-10-CM | POA: Diagnosis not present

## 2021-09-30 DIAGNOSIS — Z1152 Encounter for screening for COVID-19: Secondary | ICD-10-CM | POA: Diagnosis not present

## 2021-09-30 DIAGNOSIS — R051 Acute cough: Secondary | ICD-10-CM | POA: Diagnosis not present

## 2021-09-30 DIAGNOSIS — R6883 Chills (without fever): Secondary | ICD-10-CM | POA: Diagnosis not present

## 2021-09-30 DIAGNOSIS — D649 Anemia, unspecified: Secondary | ICD-10-CM

## 2021-09-30 DIAGNOSIS — I10 Essential (primary) hypertension: Secondary | ICD-10-CM | POA: Diagnosis not present

## 2021-09-30 DIAGNOSIS — J189 Pneumonia, unspecified organism: Secondary | ICD-10-CM | POA: Diagnosis not present

## 2021-09-30 LAB — CBC WITH DIFFERENTIAL (CANCER CENTER ONLY)
Abs Immature Granulocytes: 0.29 10*3/uL — ABNORMAL HIGH (ref 0.00–0.07)
Basophils Absolute: 0.1 10*3/uL (ref 0.0–0.1)
Basophils Relative: 1 %
Eosinophils Absolute: 0.1 10*3/uL (ref 0.0–0.5)
Eosinophils Relative: 1 %
HCT: 32 % — ABNORMAL LOW (ref 39.0–52.0)
Hemoglobin: 10.3 g/dL — ABNORMAL LOW (ref 13.0–17.0)
Immature Granulocytes: 2 %
Lymphocytes Relative: 6 %
Lymphs Abs: 0.8 10*3/uL (ref 0.7–4.0)
MCH: 26.8 pg (ref 26.0–34.0)
MCHC: 32.2 g/dL (ref 30.0–36.0)
MCV: 83.1 fL (ref 80.0–100.0)
Monocytes Absolute: 2.1 10*3/uL — ABNORMAL HIGH (ref 0.1–1.0)
Monocytes Relative: 15 %
Neutro Abs: 11 10*3/uL — ABNORMAL HIGH (ref 1.7–7.7)
Neutrophils Relative %: 75 %
Platelet Count: 17 10*3/uL — ABNORMAL LOW (ref 150–400)
RBC: 3.85 MIL/uL — ABNORMAL LOW (ref 4.22–5.81)
RDW: 19.5 % — ABNORMAL HIGH (ref 11.5–15.5)
WBC Count: 14.4 10*3/uL — ABNORMAL HIGH (ref 4.0–10.5)
nRBC: 0.1 % (ref 0.0–0.2)

## 2021-09-30 LAB — VITAMIN B12: Vitamin B-12: 297 pg/mL (ref 180–914)

## 2021-09-30 MED ORDER — ROMIPLOSTIM 250 MCG ~~LOC~~ SOLR
2.0000 ug/kg | Freq: Once | SUBCUTANEOUS | Status: AC
  Administered 2021-09-30: 145 ug via SUBCUTANEOUS
  Filled 2021-09-30: qty 0.29

## 2021-09-30 NOTE — Telephone Encounter (Signed)
TC to Pt's wife message relayed. Pt's was seen at PCP office today. Pt diagnosed with pneumonia  given Augmentin and doxycycline.

## 2021-09-30 NOTE — Progress Notes (Signed)
Patient in infusion room with complaints of nasal stuffiness/drainage, cough, and feeling ill for the past few days.  Patient stated he did run a fever over the weekend, but denied a fever today.  Patient stated he took a covid test, but it was negative. Patient instructed to call PCP regarding symptoms as they are not related to his current treatment with our office.

## 2021-09-30 NOTE — Patient Instructions (Signed)
Romiplostim injection ?What is this medication? ?ROMIPLOSTIM (roe mi PLOE stim) helps your body make more platelets. This medicine is used to treat low platelets caused by chronic idiopathic thrombocytopenic purpura (ITP) or a bone marrow syndrome caused by radiation sickness. ?This medicine may be used for other purposes; ask your health care provider or pharmacist if you have questions. ?COMMON BRAND NAME(S): Nplate ?What should I tell my care team before I take this medication? ?They need to know if you have any of these conditions: ?blood clots ?myelodysplastic syndrome ?an unusual or allergic reaction to romiplostim, mannitol, other medicines, foods, dyes, or preservatives ?pregnant or trying to get pregnant ?breast-feeding ?How should I use this medication? ?This medicine is injected under the skin. It is given by a health care provider in a hospital or clinic setting. ?A special MedGuide will be given to you before each treatment. Be sure to read this information carefully each time. ?Talk to your health care provider about the use of this medicine in children. While it may be prescribed for children as young as newborns for selected conditions, precautions do apply. ?Overdosage: If you think you have taken too much of this medicine contact a poison control center or emergency room at once. ?NOTE: This medicine is only for you. Do not share this medicine with others. ?What if I miss a dose? ?Keep appointments for follow-up doses. It is important not to miss your dose. Call your health care provider if you are unable to keep an appointment. ?What may interact with this medication? ?Interactions are not expected. ?This list may not describe all possible interactions. Give your health care provider a list of all the medicines, herbs, non-prescription drugs, or dietary supplements you use. Also tell them if you smoke, drink alcohol, or use illegal drugs. Some items may interact with your medicine. ?What should I  watch for while using this medication? ?Visit your health care provider for regular checks on your progress. You may need blood work done while you are taking this medicine. Your condition will be monitored carefully while you are receiving this medicine. It is important not to miss any appointments. ?What side effects may I notice from receiving this medication? ?Side effects that you should report to your doctor or health care professional as soon as possible: ?allergic reactions (skin rash, itching or hives; swelling of the face, lips, or tongue) ?bleeding (bloody or black, tarry stools; red or dark brown urine; spitting up blood or brown material that looks like coffee grounds; red spots on the skin; unusual bruising or bleeding from the eyes, gums, or nose) ?blood clot (chest pain; shortness of breath; pain, swelling, or warmth in the leg) ?stroke (changes in vision; confusion; trouble speaking or understanding; severe headaches; sudden numbness or weakness of the face, arm or leg; trouble walking; dizziness; loss of balance or coordination) ?Side effects that usually do not require medical attention (report to your doctor or health care professional if they continue or are bothersome): ?diarrhea ?dizziness ?headache ?joint pain ?muscle pain ?stomach pain ?trouble sleeping ?This list may not describe all possible side effects. Call your doctor for medical advice about side effects. You may report side effects to FDA at 1-800-FDA-1088. ?Where should I keep my medication? ?This medicine is given in a hospital or clinic. It will not be stored at home. ?NOTE: This sheet is a summary. It may not cover all possible information. If you have questions about this medicine, talk to your doctor, pharmacist, or health care provider. ??   2023 Elsevier/Gold Standard (2021-01-18 00:00:00) ? ?

## 2021-09-30 NOTE — Telephone Encounter (Signed)
-----   Message from Ladell Pier, MD sent at 09/30/2021  4:49 PM EDT ----- Please call wife, platelets are lower, call for bleeding,, follow-up with primary provider if he has symptoms of infection-the white cell count was high today.

## 2021-10-01 ENCOUNTER — Ambulatory Visit: Payer: Self-pay

## 2021-10-01 LAB — PROSTATE-SPECIFIC AG, SERUM (LABCORP): Prostate Specific Ag, Serum: 282 ng/mL — ABNORMAL HIGH (ref 0.0–4.0)

## 2021-10-01 NOTE — Patient Instructions (Signed)
Visit Information  Thank you for taking time to visit with me today. Please don't hesitate to contact me if I can be of assistance to you.   Following are the goals we discussed today:   Goals Addressed       Patient Stated     "I am dealing with pneumonia" (pt-stated)        Care Coordination Interventions: Evaluation of current treatment plan related to Pneumonia of both lower lobes due to infectious organism and patient's adherence to plan as established by provider Provided education to patient re: benefits of performing deep breathing exercises to help expand lungs and move trapped air; instructed patient on how to use the "4-5-6" deep breathing exercise 4-5 times every couple of hours while resting Reviewed medications with patient and discussed the importance of taking prescribed antibiotics as directed and complete full course of antibiotics Advised patient to discuss new symptoms or concerns with provider Educated patient on the importance to stay well hydrated and balance his activity with rest      Our next appointment is by telephone on 10/15/21 at 09:30 AM   Please call the care guide team at [phone] if you need to cancel or reschedule your appointment.   If you are experiencing a Mental Health or [place] or need someone to talk to, please call 1-800-273-TALK (toll free, 24 hour hotline)  Patient verbalizes understanding of instructions and care plan provided today and agrees to view in [place]. Active MyChart status and patient understanding of how to access instructions and care plan via MyChart confirmed with patient.     Barb Merino, RN, BSN, CCM Care Management Coordinator [facility] Care Management Direct Phone: [phone]

## 2021-10-01 NOTE — Patient Outreach (Signed)
  Care Coordination   Follow Up Visit Note   10/01/2021 Name: [name] MRN: [id number] DOB: 11-29-1923  [name] is a 88 y.o. year old male who sees [name], MD for primary care. I spoke with  [name] by phone today  What matters to the patients health and wellness today?  Patient would like to get over pneumonia without complications.    Goals Addressed       Patient Stated     "I am dealing with pneumonia" (pt-stated)        Care Coordination Interventions: Evaluation of current treatment plan related to Pneumonia of both lower lobes due to infectious organism and patient's adherence to plan as established by provider Provided education to patient re: benefits of performing deep breathing exercises to help expand lungs and move trapped air; instructed patient on how to use the "4-5-6" deep breathing exercise 4-5 times every couple of hours while resting Reviewed medications with patient and discussed the importance of taking prescribed antibiotics as directed and complete full course of antibiotics Advised patient to discuss new symptoms or concerns with provider Educated patient on the importance to stay well hydrated and balance his activity with rest      SDOH assessments and interventions completed:   No   Care Coordination Interventions Activated:  Yes Care Coordination Interventions:  Yes, provided  Follow up plan: Follow up call scheduled for 10/15/21 '@09'$ :30 AM   Encounter Outcome:  Pt. Visit Completed

## 2021-10-04 ENCOUNTER — Telehealth: Payer: Self-pay | Admitting: *Deleted

## 2021-10-04 DIAGNOSIS — J189 Pneumonia, unspecified organism: Secondary | ICD-10-CM | POA: Diagnosis not present

## 2021-10-04 DIAGNOSIS — I480 Paroxysmal atrial fibrillation: Secondary | ICD-10-CM | POA: Diagnosis not present

## 2021-10-04 DIAGNOSIS — R519 Headache, unspecified: Secondary | ICD-10-CM | POA: Diagnosis not present

## 2021-10-04 DIAGNOSIS — D696 Thrombocytopenia, unspecified: Secondary | ICD-10-CM | POA: Diagnosis not present

## 2021-10-04 DIAGNOSIS — I10 Essential (primary) hypertension: Secondary | ICD-10-CM | POA: Diagnosis not present

## 2021-10-04 NOTE — Telephone Encounter (Signed)
Labs at [facility] and was called the following results: WBC 16.14 (14.4 on 7/31) Platelets 18,000 (was 17,00 on 7/31) Hgb 9.2 Office will fax results to [facility]. Patient has appointment 10/07/21 for lab/Nplate. MD aware.

## 2021-10-07 ENCOUNTER — Inpatient Hospital Stay: Payer: Medicare Other | Attending: Nurse Practitioner

## 2021-10-07 ENCOUNTER — Inpatient Hospital Stay: Payer: Medicare Other

## 2021-10-07 VITALS — BP 125/62 | HR 67 | Temp 97.6°F | Resp 20 | Ht 65.0 in | Wt 159.1 lb

## 2021-10-07 DIAGNOSIS — Z5111 Encounter for antineoplastic chemotherapy: Secondary | ICD-10-CM | POA: Insufficient documentation

## 2021-10-07 DIAGNOSIS — C61 Malignant neoplasm of prostate: Secondary | ICD-10-CM | POA: Insufficient documentation

## 2021-10-07 DIAGNOSIS — D693 Immune thrombocytopenic purpura: Secondary | ICD-10-CM

## 2021-10-07 DIAGNOSIS — D696 Thrombocytopenia, unspecified: Secondary | ICD-10-CM | POA: Insufficient documentation

## 2021-10-07 DIAGNOSIS — C7951 Secondary malignant neoplasm of bone: Secondary | ICD-10-CM | POA: Insufficient documentation

## 2021-10-07 LAB — CBC WITH DIFFERENTIAL (CANCER CENTER ONLY)
Abs Immature Granulocytes: 2.89 10*3/uL — ABNORMAL HIGH (ref 0.00–0.07)
Basophils Absolute: 0.2 10*3/uL — ABNORMAL HIGH (ref 0.0–0.1)
Basophils Relative: 1 %
Eosinophils Absolute: 0.4 10*3/uL (ref 0.0–0.5)
Eosinophils Relative: 3 %
HCT: 30.7 % — ABNORMAL LOW (ref 39.0–52.0)
Hemoglobin: 9.9 g/dL — ABNORMAL LOW (ref 13.0–17.0)
Immature Granulocytes: 20 %
Lymphocytes Relative: 8 %
Lymphs Abs: 1.2 10*3/uL (ref 0.7–4.0)
MCH: 26.9 pg (ref 26.0–34.0)
MCHC: 32.2 g/dL (ref 30.0–36.0)
MCV: 83.4 fL (ref 80.0–100.0)
Monocytes Absolute: 2.3 10*3/uL — ABNORMAL HIGH (ref 0.1–1.0)
Monocytes Relative: 16 %
Neutro Abs: 7.6 10*3/uL (ref 1.7–7.7)
Neutrophils Relative %: 52 %
Platelet Count: 74 10*3/uL — ABNORMAL LOW (ref 150–400)
RBC: 3.68 MIL/uL — ABNORMAL LOW (ref 4.22–5.81)
RDW: 19.3 % — ABNORMAL HIGH (ref 11.5–15.5)
WBC Count: 14.7 10*3/uL — ABNORMAL HIGH (ref 4.0–10.5)
nRBC: 0.5 % — ABNORMAL HIGH (ref 0.0–0.2)

## 2021-10-07 MED ORDER — ROMIPLOSTIM 250 MCG ~~LOC~~ SOLR
2.0000 ug/kg | Freq: Once | SUBCUTANEOUS | Status: AC
  Administered 2021-10-07: 145 ug via SUBCUTANEOUS
  Filled 2021-10-07: qty 0.29

## 2021-10-07 NOTE — Patient Instructions (Signed)
Romiplostim Injection What is this medication? ROMIPLOSTIM (roe mi PLOE stim) treats low levels of platelets in your body caused by immune thrombocytopenia (ITP). It is prescribed when other medications have not worked or cannot be tolerated. It may also be used to help people who have been exposed to high doses of radiation. It works by increasing the amount of platelets in your blood. This lowers the risk of bleeding. This medicine may be used for other purposes; ask your health care provider or pharmacist if you have questions. COMMON BRAND NAME(S): Nplate What should I tell my care team before I take this medication? They need to know if you have any of these conditions: Blood clots Myelodysplastic syndrome An unusual or allergic reaction to romiplostim, mannitol, other medications, foods, dyes, or preservatives Pregnant or trying to get pregnant Breast-feeding How should I use this medication? This medication is injected under the skin. It is given by a care team in a hospital or clinic setting. A special MedGuide will be given to you before each treatment. Be sure to read this information carefully each time. Talk to your care team about the use of this medication in children. While it may be prescribed for children as young as newborns for selected conditions, precautions do apply. Overdosage: If you think you have taken too much of this medicine contact a poison control center or emergency room at once. NOTE: This medicine is only for you. Do not share this medicine with others. What if I miss a dose? Keep appointments for follow-up doses. It is important not to miss your dose. Call your care team if you are unable to keep an appointment. What may interact with this medication? Interactions are not expected. This list may not describe all possible interactions. Give your health care provider a list of all the medicines, herbs, non-prescription drugs, or dietary supplements you use. Also  tell them if you smoke, drink alcohol, or use illegal drugs. Some items may interact with your medicine. What should I watch for while using this medication? Visit your care team for regular checks on your progress. You may need blood work done while you are taking this medication. Your condition will be monitored carefully while you are receiving this medication. It is important not to miss any appointments. What side effects may I notice from receiving this medication? Side effects that you should report to your care team as soon as possible: Allergic reactions--skin rash, itching, hives, swelling of the face, lips, tongue, or throat Blood clot--pain, swelling, or warmth in the leg, shortness of breath, chest pain Side effects that usually do not require medical attention (report to your care team if they continue or are bothersome): Dizziness Joint pain Muscle pain Pain in the hands or feet Stomach pain Trouble sleeping This list may not describe all possible side effects. Call your doctor for medical advice about side effects. You may report side effects to FDA at 1-800-FDA-1088. Where should I keep my medication? This medication is given in a hospital or clinic. It will not be stored at home. NOTE: This sheet is a summary. It may not cover all possible information. If you have questions about this medicine, talk to your doctor, pharmacist, or health care provider.  2023 Elsevier/Gold Standard (2021-05-28 00:00:00)  

## 2021-10-08 DIAGNOSIS — D696 Thrombocytopenia, unspecified: Secondary | ICD-10-CM | POA: Diagnosis not present

## 2021-10-08 DIAGNOSIS — J189 Pneumonia, unspecified organism: Secondary | ICD-10-CM | POA: Diagnosis not present

## 2021-10-08 DIAGNOSIS — I10 Essential (primary) hypertension: Secondary | ICD-10-CM | POA: Diagnosis not present

## 2021-10-08 DIAGNOSIS — I480 Paroxysmal atrial fibrillation: Secondary | ICD-10-CM | POA: Diagnosis not present

## 2021-10-14 ENCOUNTER — Inpatient Hospital Stay: Payer: Medicare Other

## 2021-10-14 VITALS — BP 117/56 | HR 60 | Temp 97.8°F | Resp 20 | Ht 65.0 in | Wt 159.4 lb

## 2021-10-14 DIAGNOSIS — D693 Immune thrombocytopenic purpura: Secondary | ICD-10-CM

## 2021-10-14 DIAGNOSIS — C7951 Secondary malignant neoplasm of bone: Secondary | ICD-10-CM | POA: Diagnosis not present

## 2021-10-14 DIAGNOSIS — Z5111 Encounter for antineoplastic chemotherapy: Secondary | ICD-10-CM | POA: Diagnosis not present

## 2021-10-14 DIAGNOSIS — D696 Thrombocytopenia, unspecified: Secondary | ICD-10-CM | POA: Diagnosis not present

## 2021-10-14 DIAGNOSIS — C61 Malignant neoplasm of prostate: Secondary | ICD-10-CM | POA: Diagnosis not present

## 2021-10-14 LAB — CBC WITH DIFFERENTIAL (CANCER CENTER ONLY)
Abs Immature Granulocytes: 0.3 10*3/uL — ABNORMAL HIGH (ref 0.00–0.07)
Band Neutrophils: 2 %
Basophils Absolute: 0 10*3/uL (ref 0.0–0.1)
Basophils Relative: 0 %
Eosinophils Absolute: 0.3 10*3/uL (ref 0.0–0.5)
Eosinophils Relative: 3 %
HCT: 30.1 % — ABNORMAL LOW (ref 39.0–52.0)
Hemoglobin: 9.3 g/dL — ABNORMAL LOW (ref 13.0–17.0)
Lymphocytes Relative: 16 %
Lymphs Abs: 1.6 10*3/uL (ref 0.7–4.0)
MCH: 25.9 pg — ABNORMAL LOW (ref 26.0–34.0)
MCHC: 30.9 g/dL (ref 30.0–36.0)
MCV: 83.8 fL (ref 80.0–100.0)
Metamyelocytes Relative: 1 %
Monocytes Absolute: 1.7 10*3/uL — ABNORMAL HIGH (ref 0.1–1.0)
Monocytes Relative: 17 %
Myelocytes: 2 %
Neutro Abs: 6 10*3/uL (ref 1.7–7.7)
Neutrophils Relative %: 59 %
Platelet Count: 150 10*3/uL (ref 150–400)
RBC: 3.59 MIL/uL — ABNORMAL LOW (ref 4.22–5.81)
RDW: 20 % — ABNORMAL HIGH (ref 11.5–15.5)
WBC Count: 9.9 10*3/uL (ref 4.0–10.5)
nRBC: 0.4 % — ABNORMAL HIGH (ref 0.0–0.2)

## 2021-10-14 MED ORDER — ROMIPLOSTIM 250 MCG ~~LOC~~ SOLR
2.0000 ug/kg | Freq: Once | SUBCUTANEOUS | Status: AC
  Administered 2021-10-14: 145 ug via SUBCUTANEOUS
  Filled 2021-10-14: qty 0.29

## 2021-10-14 NOTE — Patient Instructions (Signed)
Romiplostim Injection What is this medication? ROMIPLOSTIM (roe mi PLOE stim) treats low levels of platelets in your body caused by immune thrombocytopenia (ITP). It is prescribed when other medications have not worked or cannot be tolerated. It may also be used to help people who have been exposed to high doses of radiation. It works by increasing the amount of platelets in your blood. This lowers the risk of bleeding. This medicine may be used for other purposes; ask your health care provider or pharmacist if you have questions. COMMON BRAND NAME(S): Nplate What should I tell my care team before I take this medication? They need to know if you have any of these conditions: Blood clots Myelodysplastic syndrome An unusual or allergic reaction to romiplostim, mannitol, other medications, foods, dyes, or preservatives Pregnant or trying to get pregnant Breast-feeding How should I use this medication? This medication is injected under the skin. It is given by a care team in a hospital or clinic setting. A special MedGuide will be given to you before each treatment. Be sure to read this information carefully each time. Talk to your care team about the use of this medication in children. While it may be prescribed for children as young as newborns for selected conditions, precautions do apply. Overdosage: If you think you have taken too much of this medicine contact a poison control center or emergency room at once. NOTE: This medicine is only for you. Do not share this medicine with others. What if I miss a dose? Keep appointments for follow-up doses. It is important not to miss your dose. Call your care team if you are unable to keep an appointment. What may interact with this medication? Interactions are not expected. This list may not describe all possible interactions. Give your health care provider a list of all the medicines, herbs, non-prescription drugs, or dietary supplements you use. Also  tell them if you smoke, drink alcohol, or use illegal drugs. Some items may interact with your medicine. What should I watch for while using this medication? Visit your care team for regular checks on your progress. You may need blood work done while you are taking this medication. Your condition will be monitored carefully while you are receiving this medication. It is important not to miss any appointments. What side effects may I notice from receiving this medication? Side effects that you should report to your care team as soon as possible: Allergic reactions--skin rash, itching, hives, swelling of the face, lips, tongue, or throat Blood clot--pain, swelling, or warmth in the leg, shortness of breath, chest pain Side effects that usually do not require medical attention (report to your care team if they continue or are bothersome): Dizziness Joint pain Muscle pain Pain in the hands or feet Stomach pain Trouble sleeping This list may not describe all possible side effects. Call your doctor for medical advice about side effects. You may report side effects to FDA at 1-800-FDA-1088. Where should I keep my medication? This medication is given in a hospital or clinic. It will not be stored at home. NOTE: This sheet is a summary. It may not cover all possible information. If you have questions about this medicine, talk to your doctor, pharmacist, or health care provider.  2023 Elsevier/Gold Standard (2021-05-28 00:00:00)  

## 2021-10-15 ENCOUNTER — Ambulatory Visit: Payer: Self-pay

## 2021-10-15 NOTE — Patient Instructions (Signed)
Visit Information  Thank you for taking time to visit with me today. Please don't hesitate to contact me if I can be of assistance to you.   Following are the goals we discussed today:   Goals Addressed       Patient Stated     COMPLETED: "I am dealing with pneumonia" (pt-stated)        Care Coordination Interventions: Evaluation of current treatment plan related to Pneumonia of both lower lobes due to infectious organism and patient's adherence to plan as established by provider Review of patient status, including review of consultant's reports, relevant laboratory and other test results, and medications completed Advised patient to discuss new symptoms or concerns with provider Educated patient on the importance to stay well hydrated and balance his activity with rest      Our next appointment is by telephone on 11/12/21 at 09:30 AM  Please call the care guide team at [phone] if you need to cancel or reschedule your appointment.   If you are experiencing a Mental Health or [place] or need someone to talk to, please call 1-800-273-TALK (toll free, 24 hour hotline)  Patient verbalizes understanding of instructions and care plan provided today and agrees to view in [place]. Active MyChart status and patient understanding of how to access instructions and care plan via MyChart confirmed with patient.     Barb Merino, RN, BSN, CCM Care Management Coordinator [facility] Care Management  Direct Phone: [phone]

## 2021-10-15 NOTE — Patient Outreach (Signed)
  Care Coordination   Follow Up Visit Note   10/15/2021 Name: REMY VOILES MRN: [id number] DOB: Jul 23, 1923  JULE SCHLABACH is a 88 y.o. year old male who sees [name], MD for primary care. I spoke with  [name] by phone today  What matters to the patients health and wellness today?  Patient would like to stay healthy.     Goals Addressed       Patient Stated     COMPLETED: "I am dealing with pneumonia" (pt-stated)        Care Coordination Interventions: Evaluation of current treatment plan related to Pneumonia of both lower lobes due to infectious organism and patient's adherence to plan as established by provider Review of patient status, including review of consultant's reports, relevant laboratory and other test results, and medications completed Advised patient to discuss new symptoms or concerns with provider Educated patient on the importance to stay well hydrated and balance his activity with rest        "To determine why I am still having shortness of breath" (pt-stated)        Care Coordination Interventions: Evaluation of current treatment plan related to dyspnea and patient's adherence to plan as established by provider Collaborated with Dr. Daneen Schick, Cardiologist regarding patient's ongoing complaints of shortness of breath  Advised patient to discuss new symptoms or concerns with provider     SDOH assessments and interventions completed:  Yes    Care Coordination Interventions Activated:  Yes  Care Coordination Interventions:  Yes, provided   Follow up plan: Follow up call scheduled for 11/12/21 '@09'$ :30 AM    Encounter Outcome:  Pt. Visit Completed

## 2021-10-15 NOTE — Progress Notes (Signed)
This encounter was created in error - please disregard.

## 2021-10-21 ENCOUNTER — Inpatient Hospital Stay (HOSPITAL_BASED_OUTPATIENT_CLINIC_OR_DEPARTMENT_OTHER): Payer: Medicare Other | Admitting: Oncology

## 2021-10-21 ENCOUNTER — Inpatient Hospital Stay: Payer: Medicare Other

## 2021-10-21 VITALS — BP 123/58 | HR 65 | Temp 98.1°F | Resp 18 | Ht 65.0 in | Wt 159.0 lb

## 2021-10-21 DIAGNOSIS — D693 Immune thrombocytopenic purpura: Secondary | ICD-10-CM

## 2021-10-21 DIAGNOSIS — D696 Thrombocytopenia, unspecified: Secondary | ICD-10-CM | POA: Diagnosis not present

## 2021-10-21 DIAGNOSIS — C61 Malignant neoplasm of prostate: Secondary | ICD-10-CM

## 2021-10-21 DIAGNOSIS — Z5111 Encounter for antineoplastic chemotherapy: Secondary | ICD-10-CM | POA: Diagnosis not present

## 2021-10-21 DIAGNOSIS — C7951 Secondary malignant neoplasm of bone: Secondary | ICD-10-CM

## 2021-10-21 LAB — CBC WITH DIFFERENTIAL (CANCER CENTER ONLY)
Abs Immature Granulocytes: 0.6 10*3/uL — ABNORMAL HIGH (ref 0.00–0.07)
Band Neutrophils: 2 %
Basophils Absolute: 0.1 10*3/uL (ref 0.0–0.1)
Basophils Relative: 1 %
Eosinophils Absolute: 0.2 10*3/uL (ref 0.0–0.5)
Eosinophils Relative: 3 %
HCT: 30 % — ABNORMAL LOW (ref 39.0–52.0)
Hemoglobin: 9.6 g/dL — ABNORMAL LOW (ref 13.0–17.0)
Lymphocytes Relative: 16 %
Lymphs Abs: 1.2 10*3/uL (ref 0.7–4.0)
MCH: 26.7 pg (ref 26.0–34.0)
MCHC: 32 g/dL (ref 30.0–36.0)
MCV: 83.3 fL (ref 80.0–100.0)
Metamyelocytes Relative: 1 %
Monocytes Absolute: 1.2 10*3/uL — ABNORMAL HIGH (ref 0.1–1.0)
Monocytes Relative: 15 %
Myelocytes: 7 %
Neutro Abs: 4.4 10*3/uL (ref 1.7–7.7)
Neutrophils Relative %: 55 %
Platelet Count: 130 10*3/uL — ABNORMAL LOW (ref 150–400)
RBC: 3.6 MIL/uL — ABNORMAL LOW (ref 4.22–5.81)
RDW: 19.9 % — ABNORMAL HIGH (ref 11.5–15.5)
WBC Count: 7.8 10*3/uL (ref 4.0–10.5)
nRBC: 0.5 % — ABNORMAL HIGH (ref 0.0–0.2)

## 2021-10-21 LAB — FERRITIN: Ferritin: 331 ng/mL (ref 24–336)

## 2021-10-21 MED ORDER — LEUPROLIDE ACETATE (3 MONTH) 22.5 MG ~~LOC~~ KIT
22.5000 mg | PACK | Freq: Once | SUBCUTANEOUS | Status: AC
  Administered 2021-10-21: 22.5 mg via SUBCUTANEOUS
  Filled 2021-10-21: qty 22.5

## 2021-10-21 MED ORDER — ROMIPLOSTIM 250 MCG ~~LOC~~ SOLR
2.0000 ug/kg | Freq: Once | SUBCUTANEOUS | Status: AC
  Administered 2021-10-21: 145 ug via SUBCUTANEOUS
  Filled 2021-10-21: qty 0.29

## 2021-10-21 NOTE — Progress Notes (Signed)
[facility] OFFICE PROGRESS NOTE   Diagnosis: Thrombocytopenia, prostate cancer, anemia  INTERVAL HISTORY:   Mr. Capek returns as scheduled.  He continues weekly Nplate.  He was treated with Lupron approximately 3 months ago.  He no longer has back pain.  He was diagnosed with pneumonia 09/30/2021.  He completed a course of antibiotics.  His symptoms improved.  He continues to have exertional dyspnea.  This is a chronic symptom.  Objective:  Vital signs in last 24 hours:  Blood pressure (!) 123/58, pulse 65, temperature 98.1 F (36.7 C), temperature source Oral, resp. rate 18, height '5\' 5"'  (1.651 m), weight 159 lb (72.1 kg), SpO2 98 %.    Resp: End inspiratory rales at the right posterior base, no respiratory distress Cardio: Regular rate and rhythm GI: No hepatomegaly Vascular: No leg edema   Lab Results:  Lab Results  Component Value Date   WBC 9.9 10/14/2021   HGB 9.3 (L) 10/14/2021   HCT 30.1 (L) 10/14/2021   MCV 83.8 10/14/2021   PLT 150 10/14/2021   NEUTROABS 6.0 10/14/2021    CMP  Lab Results  Component Value Date   NA 138 07/31/2021   K 4.0 07/31/2021   CL 104 07/31/2021   CO2 20 07/31/2021   GLUCOSE 145 (H) 07/31/2021   BUN 19 07/31/2021   CREATININE 1.38 (H) 07/31/2021   CALCIUM 8.3 (L) 07/31/2021   PROT 6.5 08/20/2020   ALBUMIN 3.9 08/20/2020   AST 18 08/20/2020   ALT 14 08/20/2020   ALKPHOS 106 08/20/2020   BILITOT 0.4 08/20/2020   GFRNONAA 52 (L) 08/20/2020   GFRAA 59 (L) 10/31/2019    Medications: I have reviewed the patient's current medications.   Assessment/Plan:  Thrombocytopenia Bone marrow biopsy 07/08/2016-no evidence of B-cell lymphoma, 40% cellular bone marrow with trilineage hematopoiesis, megakaryocytes present with normal morphology, normal cytogenetics, negative for BRAF mutation Flow cytometry 01/05/2009-no monoclonal B-cell or phenotypically abnormal T-cell population Prednisone starting 06/29/2019 Nplate  03/08/1094, 0/45/4098, Nplate 07/18/2019 Prednisone 20 mg daily beginning 07/25/2019 Platelets 11,000 on 08/15/2019, prednisone increased to 40 mg daily, Nplate resume 03/21/1476  Prednisone decreased to 30 mg daily 08/25/2019, Nplate continued Prednisone continued at 30 mg daily 09/13/2019, weekly Nplate continued Prednisone taper to 20 mg daily 09/23/2019, weekly Nplate continued Prednisone taper to 10 mg daily 09/30/2019, weekly Nplate continued Prednisone taper to 5 mg daily 10/21/2019, weekly Nplate continued Prednisone taper to 5 mg every other day for 5 doses then stop 11/11/2019, weekly Nplate continued Nplate last given 2/95/6213 Promacta cost prohibitive Nplate beginning 0/10/6576, held 08/27/2020 and 09/03/2020 due to patient vacation Nplate resumed 4/69/6295 Nplate held 04/10/4130 secondary to an elevated platelet count Nplate resumed with dose reduction to 1 mcg/kg 10/09/2020 Nplate dose decreased to 0.5 mcg/kg 10/29/2020 Nplate dose increased to 1 mcg/kg 11/07/2020 Hairy cell leukemia 1982, status post splenectomy Coronary artery disease Aortic stenosis Liposarcoma at the right chest wall resected in 2013 Macular degeneration Hearing loss Basal cell carcinoma left cheek 05/24/2019 Left upper lobe nodule, faint FDG activity on PET at [facility] 05/31/2013 Status post TAVR procedure 09/06/2019 Admission with Pseudomonas urosepsis 09/16/2019 Acute left MCA distribution CVA 09/16/2019 Covid January 2022 Mild anemia, 1 of 3 stool Hemoccult cards positive for occult blood 10/14/2020, ferritin low 06/29/2020 Virtual colonoscopy 06/25/2021 -5 mm polypoid defect in the ascending colon 15.  Prostate cancer-sclerotic bone lesions/prostatic hypertrophy on CT 06/25/2021; Lupron every 3 months beginning 08/02/2021, bicalutamide 08/02/2021 for 2 weeks 16.  Pneumonia 09/30/2021  Disposition: Mr. Mitro has chronic thrombocytopenia, likely secondary to ITP versus bone marrow involvement by prostate cancer.  He continues  weekly Nplate.  The platelet count is adequate today.  The hemoglobin remains low, potentially related to the recent episode of pneumonia or prostate cancer.  Mr. Ormiston has completed 1 treatment with leuprolide.  He will complete another treatment today.  The PSA is still elevated when he was here on 09/30/2021.  We will follow-up on the PSA from today.  We will consider adding abiraterone/prednisone if the PSA remains elevated.  Mr. Eguia will return for an office visit and Nplate 11/06/2021.  Betsy Coder, MD  10/21/2021  10:35 AM

## 2021-10-21 NOTE — Patient Instructions (Signed)
[facility]   Discharge Instructions: Thank you for choosing [facility] to provide your oncology and hematology care.   If you have a lab appointment with the [facility], please go directly to the [facility] and check in at the registration area.   Wear comfortable clothing and clothing appropriate for easy access to any Portacath or PICC line.   We strive to give you quality time with your provider. You may need to reschedule your appointment if you arrive late (15 or more minutes).  Arriving late affects you and other patients whose appointments are after yours.  Also, if you miss three or more appointments without notifying the office, you may be dismissed from the clinic at the provider's discretion.      For prescription refill requests, have your pharmacy contact our office and allow 72 hours for refills to be completed.    Today you received the following:  Nplate, Leupron      To help prevent nausea and vomiting after your treatment, we encourage you to take your nausea medication as directed.  BELOW ARE SYMPTOMS THAT SHOULD BE REPORTED IMMEDIATELY: *FEVER GREATER THAN 100.4 F (38 C) OR HIGHER *CHILLS OR SWEATING *NAUSEA AND VOMITING THAT IS NOT CONTROLLED WITH YOUR NAUSEA MEDICATION *UNUSUAL SHORTNESS OF BREATH *UNUSUAL BRUISING OR BLEEDING *URINARY PROBLEMS (pain or burning when urinating, or frequent urination) *BOWEL PROBLEMS (unusual diarrhea, constipation, pain near the anus) TENDERNESS IN MOUTH AND THROAT WITH OR WITHOUT PRESENCE OF ULCERS (sore throat, sores in mouth, or a toothache) UNUSUAL RASH, SWELLING OR PAIN  UNUSUAL VAGINAL DISCHARGE OR ITCHING   Items with * indicate a potential emergency and should be followed up as soon as possible or go to the Emergency Department if any problems should occur.  Please show the CHEMOTHERAPY ALERT CARD or IMMUNOTHERAPY ALERT CARD at check-in to the Emergency Department and triage  nurse.  Should you have questions after your visit or need to cancel or reschedule your appointment, please contact [facility]  Dept: [phone]  and follow the prompts.  Office hours are 8:00 a.m. to 4:30 p.m. Monday - Friday. Please note that voicemails left after 4:00 p.m. may not be returned until the following business day.  We are closed weekends and major holidays. You have access to a nurse at all times for urgent questions. Please call the main number to the clinic Dept: [phone] and follow the prompts.   For any non-urgent questions, you may also contact your provider using MyChart. We now offer e-Visits for anyone 77 and older to request care online for non-urgent symptoms. For details visit [email or web address].   Also download the MyChart app! Go to the app store, search "MyChart", open the app, select [facility], and log in with your MyChart username and password.  Masks are optional in the cancer centers. If you would like for your care team to wear a mask while they are taking care of you, please let them know. You may have one support person who is at least 86 years old accompany you for your appointments.  Romiplostim Injection What is this medication? ROMIPLOSTIM (roe mi PLOE stim) treats low levels of platelets in your body caused by immune thrombocytopenia (ITP). It is prescribed when other medications have not worked or cannot be tolerated. It may also be used to help people who have been exposed to high doses of radiation. It works by increasing the amount of platelets  in your blood. This lowers the risk of bleeding. This medicine may be used for other purposes; ask your health care provider or pharmacist if you have questions. COMMON BRAND NAME(S): Nplate What should I tell my care team before I take this medication? They need to know if you have any of these conditions: Blood clots Myelodysplastic syndrome An unusual or allergic  reaction to romiplostim, mannitol, other medications, foods, dyes, or preservatives Pregnant or trying to get pregnant Breast-feeding How should I use this medication? This medication is injected under the skin. It is given by a care team in a hospital or clinic setting. A special MedGuide will be given to you before each treatment. Be sure to read this information carefully each time. Talk to your care team about the use of this medication in children. While it may be prescribed for children as young as newborns for selected conditions, precautions do apply. Overdosage: If you think you have taken too much of this medicine contact a poison control center or emergency room at once. NOTE: This medicine is only for you. Do not share this medicine with others. What if I miss a dose? Keep appointments for follow-up doses. It is important not to miss your dose. Call your care team if you are unable to keep an appointment. What may interact with this medication? Interactions are not expected. This list may not describe all possible interactions. Give your health care provider a list of all the medicines, herbs, non-prescription drugs, or dietary supplements you use. Also tell them if you smoke, drink alcohol, or use illegal drugs. Some items may interact with your medicine. What should I watch for while using this medication? Visit your care team for regular checks on your progress. You may need blood work done while you are taking this medication. Your condition will be monitored carefully while you are receiving this medication. It is important not to miss any appointments. What side effects may I notice from receiving this medication? Side effects that you should report to your care team as soon as possible: Allergic reactions--skin rash, itching, hives, swelling of the face, lips, tongue, or throat Blood clot--pain, swelling, or warmth in the leg, shortness of breath, chest pain Side effects that  usually do not require medical attention (report to your care team if they continue or are bothersome): Dizziness Joint pain Muscle pain Pain in the hands or feet Stomach pain Trouble sleeping This list may not describe all possible side effects. Call your doctor for medical advice about side effects. You may report side effects to FDA at 1-800-FDA-1088. Where should I keep my medication? This medication is given in a hospital or clinic. It will not be stored at home. NOTE: This sheet is a summary. It may not cover all possible information. If you have questions about this medicine, talk to your doctor, pharmacist, or health care provider.  2023 Elsevier/Gold Standard (2021-05-28 00:00:00)  Leuprolide Suspension for Injection (Prostate Cancer) What is this medication? LEUPROLIDE (loo PROE lide) reduces the symptoms of prostate cancer. It works by decreasing levels of the hormone testosterone in the body. This prevents prostate cancer cells from spreading or growing. This medicine may be used for other purposes; ask your health care provider or pharmacist if you have questions. COMMON BRAND NAME(S): Eligard, Fensolvi, Lupron Depot, Lupron Depot-Ped, Lutrate Depot, Viadur What should I tell my care team before I take this medication? They need to know if you have any of these conditions: Diabetes Heart disease  Heart failure High or low levels of electrolytes, such as magnesium, potassium, or sodium in your blood Irregular heartbeat or rhythm Seizures An unusual or allergic reaction to leuprolide, other medications, foods, dyes, or preservatives Pregnant or trying to get pregnant Breast-feeding How should I use this medication? This medication is injected under the skin or into a muscle. It is given by your care team in a hospital or clinic setting. Talk to your care team about the use of this medication in children. Special care may be needed. Overdosage: If you think you have taken  too much of this medicine contact a poison control center or emergency room at once. NOTE: This medicine is only for you. Do not share this medicine with others. What if I miss a dose? Keep appointments for follow-up doses. It is important not to miss your dose. Call your care team if you are unable to keep an appointment. What may interact with this medication? Do not take this medication with any of the following: Cisapride Dronedarone Ketoconazole Levoketoconazole Pimozide Thioridazine This medication may also interact with the following: Other medications that cause heart rhythm changes This list may not describe all possible interactions. Give your health care provider a list of all the medicines, herbs, non-prescription drugs, or dietary supplements you use. Also tell them if you smoke, drink alcohol, or use illegal drugs. Some items may interact with your medicine. What should I watch for while using this medication? Visit your care team for regular checks on your progress. Tell your care team if your symptoms do not start to get better or if they get worse. This medication may increase blood sugar. The risk may be higher in patients who already have diabetes. Ask your care team what you can do to lower the risk of diabetes while taking this medication. This medication may cause infertility. Talk to your care team if you are concerned about your fertility. Heart attacks and strokes have been reported with the use of this medication. Get emergency help if you develop signs or symptoms of a heart attack or stroke. Talk to your care team about the risks and benefits of this medication. What side effects may I notice from receiving this medication? Side effects that you should report to your care team as soon as possible: Allergic reactions--skin rash, itching, hives, swelling of the face, lips, tongue, or throat Heart attack--pain or tightness in the chest, shoulders, arms, or jaw, nausea,  shortness of breath, cold or clammy skin, feeling faint or lightheaded Heart rhythm changes--fast or irregular heartbeat, dizziness, feeling faint or lightheaded, chest pain, trouble breathing High blood sugar (hyperglycemia)--increased thirst or amount of urine, unusual weakness or fatigue, blurry vision Mood swings, irritability, hostility Seizures Stroke--sudden numbness or weakness of the face, arm, or leg, trouble speaking, confusion, trouble walking, loss of balance or coordination, dizziness, severe headache, change in vision Thoughts of suicide or self-harm, worsening mood, feelings of depression Side effects that usually do not require medical attention (report to your care team if they continue or are bothersome): Bone pain Change in sex drive or performance General discomfort and fatigue Hot flashes Muscle pain Pain, redness, or irritation at injection site Swelling of the ankles, hands, or feet This list may not describe all possible side effects. Call your doctor for medical advice about side effects. You may report side effects to FDA at 1-800-FDA-1088. Where should I keep my medication? This medication is given in a hospital or clinic. It will not be stored  at home. NOTE: This sheet is a summary. It may not cover all possible information. If you have questions about this medicine, talk to your doctor, pharmacist, or health care provider.  2023 Elsevier/Gold Standard (2021-04-29 00:00:00)

## 2021-10-22 DIAGNOSIS — H353221 Exudative age-related macular degeneration, left eye, with active choroidal neovascularization: Secondary | ICD-10-CM | POA: Diagnosis not present

## 2021-10-22 LAB — PROSTATE-SPECIFIC AG, SERUM (LABCORP): Prostate Specific Ag, Serum: 363 ng/mL — ABNORMAL HIGH (ref 0.0–4.0)

## 2021-10-28 ENCOUNTER — Telehealth: Payer: Self-pay

## 2021-10-28 ENCOUNTER — Telehealth: Payer: Self-pay | Admitting: Pharmacist

## 2021-10-28 ENCOUNTER — Encounter: Payer: Self-pay | Admitting: Oncology

## 2021-10-28 ENCOUNTER — Other Ambulatory Visit: Payer: Self-pay

## 2021-10-28 ENCOUNTER — Other Ambulatory Visit (HOSPITAL_COMMUNITY): Payer: Self-pay

## 2021-10-28 ENCOUNTER — Telehealth: Payer: Self-pay | Admitting: Pharmacy Technician

## 2021-10-28 DIAGNOSIS — C7951 Secondary malignant neoplasm of bone: Secondary | ICD-10-CM

## 2021-10-28 DIAGNOSIS — Z79899 Other long term (current) drug therapy: Secondary | ICD-10-CM | POA: Diagnosis not present

## 2021-10-28 DIAGNOSIS — N402 Nodular prostate without lower urinary tract symptoms: Secondary | ICD-10-CM

## 2021-10-28 DIAGNOSIS — D693 Immune thrombocytopenic purpura: Secondary | ICD-10-CM | POA: Diagnosis not present

## 2021-10-28 MED ORDER — PREDNISONE 5 MG PO TABS
5.0000 mg | ORAL_TABLET | Freq: Every day | ORAL | 0 refills | Status: DC
  Filled 2021-10-28: qty 30, 30d supply, fill #0

## 2021-10-28 MED ORDER — ABIRATERONE ACETATE 250 MG PO TABS
1000.0000 mg | ORAL_TABLET | Freq: Every day | ORAL | 0 refills | Status: DC
  Filled 2021-10-28 – 2021-11-05 (×2): qty 120, 30d supply, fill #0

## 2021-10-28 NOTE — Telephone Encounter (Signed)
-----   Message from Ladell Pier, MD sent at 10/26/2021  3:34 PM EDT ----- Please call patient, the PSA is higher, start abiraterone 1000 mg daily and prednisone 5 mg daily, follow-up as scheduled

## 2021-10-28 NOTE — Telephone Encounter (Signed)
Patient gave verbal understanding and had no further questions or concerns  

## 2021-10-28 NOTE — Telephone Encounter (Signed)
Oral Oncology Patient Advocate Encounter   Received notification that prior authorization for Abiraterone is required.   PA submitted on 10/28/2021 Key [id number] Status is pending     [name], CPhT-Adv Oncology Pharmacy Patient [facility] Direct Number: [phone]  Fax: [phone]

## 2021-10-28 NOTE — Telephone Encounter (Signed)
-----   Message from Ladell Pier, MD sent at 10/26/2021  3:38 PM EDT ----- Add CMP next lab

## 2021-10-28 NOTE — Telephone Encounter (Signed)
Oral Oncology Pharmacist Encounter  Received new prescription for Zytiga (abiraterone) for the treatment of metastatic prostate cancer in conjunction with prednisone and ADT, planned duration until disease progression or unacceptable drug toxicity.  CMP from 07/22/21 assessed, no relevant lab abnormalities. CMP lab order has been entered for future monitoring. Prescription dose and frequency assessed.   Current medication list in Epic reviewed, several DDIs with abiraterone identified: Atorvastatin: Abiraterone Acetate may enhance the myopathic (rhabdomyolysis) effect of statins. Monitor for evidence of muscle toxicities (eg, myopathy, rhabdomyolysis). No baseline dose adjustment needed. Metoprolol: abiraterone may increase the serum concentration or metoprolol. Monitor closely for evidence of excessive response to metoprolol (low HR, low BP). No baseline dose adjustment needed. Tamsulosin: abiraterone may increase the serum concentration of tamsulosin. Monitor for increased tamsulosin effects (eg, hypotension, orthostasis). No baseline dose adjustment needed. Tramadol: abiraterone may decrease serum concentrations of the active metabolite(s) of tramadol. Monitor for decreased therapeutic response. No baseline dose adjustment needed.  Evaluated chart and no patient barriers to medication adherence identified.   Prescription has been e-scribed to the [facility] for benefits analysis and approval.  Oral Oncology Clinic will continue to follow for insurance authorization, copayment issues, initial counseling and start date.   Darl Pikes, PharmD, BCPS, BCOP, CPP Hematology/Oncology Clinical Pharmacist Practitioner [place]/DB/AP Oral [facility] [phone]  10/28/2021 4:25 PM

## 2021-10-28 NOTE — Telephone Encounter (Signed)
CMP is added to the patient next appointment.

## 2021-10-29 ENCOUNTER — Other Ambulatory Visit (HOSPITAL_COMMUNITY): Payer: Self-pay

## 2021-10-29 NOTE — Telephone Encounter (Signed)
Oral Oncology Patient Advocate Encounter  Prior Authorization for Abiraterone has been approved.    PA# [id number] Effective dates: 10/29/2021 through 10/30/2022  Patients co-pay is $581.63.    [name], CPhT-Adv Oncology Pharmacy Patient [facility] Direct Number: [phone]  Fax: [phone]

## 2021-10-30 NOTE — Telephone Encounter (Signed)
Patient wants to wait until after his appt on 9/5 before proceeding with the abiraterone.

## 2021-11-05 ENCOUNTER — Other Ambulatory Visit (HOSPITAL_COMMUNITY): Payer: Self-pay

## 2021-11-05 ENCOUNTER — Inpatient Hospital Stay: Payer: Medicare Other

## 2021-11-05 ENCOUNTER — Inpatient Hospital Stay (HOSPITAL_BASED_OUTPATIENT_CLINIC_OR_DEPARTMENT_OTHER): Payer: Medicare Other | Admitting: Oncology

## 2021-11-05 ENCOUNTER — Inpatient Hospital Stay: Payer: Medicare Other | Attending: Nurse Practitioner

## 2021-11-05 VITALS — BP 131/62 | HR 58 | Temp 97.6°F | Resp 16 | Wt 157.0 lb

## 2021-11-05 DIAGNOSIS — Z5181 Encounter for therapeutic drug level monitoring: Secondary | ICD-10-CM

## 2021-11-05 DIAGNOSIS — D696 Thrombocytopenia, unspecified: Secondary | ICD-10-CM | POA: Diagnosis not present

## 2021-11-05 DIAGNOSIS — C7951 Secondary malignant neoplasm of bone: Secondary | ICD-10-CM

## 2021-11-05 DIAGNOSIS — D693 Immune thrombocytopenic purpura: Secondary | ICD-10-CM

## 2021-11-05 DIAGNOSIS — Z79899 Other long term (current) drug therapy: Secondary | ICD-10-CM | POA: Insufficient documentation

## 2021-11-05 DIAGNOSIS — C61 Malignant neoplasm of prostate: Secondary | ICD-10-CM | POA: Diagnosis not present

## 2021-11-05 LAB — CBC WITH DIFFERENTIAL (CANCER CENTER ONLY)
Abs Immature Granulocytes: 0.5 10*3/uL — ABNORMAL HIGH (ref 0.00–0.07)
Basophils Absolute: 0.1 10*3/uL (ref 0.0–0.1)
Basophils Relative: 1 %
Eosinophils Absolute: 0.2 10*3/uL (ref 0.0–0.5)
Eosinophils Relative: 2 %
HCT: 30.7 % — ABNORMAL LOW (ref 39.0–52.0)
Hemoglobin: 9.7 g/dL — ABNORMAL LOW (ref 13.0–17.0)
Immature Granulocytes: 5 %
Lymphocytes Relative: 16 %
Lymphs Abs: 1.5 10*3/uL (ref 0.7–4.0)
MCH: 26.8 pg (ref 26.0–34.0)
MCHC: 31.6 g/dL (ref 30.0–36.0)
MCV: 84.8 fL (ref 80.0–100.0)
Monocytes Absolute: 1.9 10*3/uL — ABNORMAL HIGH (ref 0.1–1.0)
Monocytes Relative: 21 %
Neutro Abs: 5 10*3/uL (ref 1.7–7.7)
Neutrophils Relative %: 55 %
Platelet Count: 99 10*3/uL — ABNORMAL LOW (ref 150–400)
RBC: 3.62 MIL/uL — ABNORMAL LOW (ref 4.22–5.81)
RDW: 19.7 % — ABNORMAL HIGH (ref 11.5–15.5)
WBC Count: 9.3 10*3/uL (ref 4.0–10.5)
nRBC: 0.3 % — ABNORMAL HIGH (ref 0.0–0.2)

## 2021-11-05 LAB — CMP (CANCER CENTER ONLY)
ALT: 13 U/L (ref 0–44)
AST: 20 U/L (ref 15–41)
Albumin: 3.7 g/dL (ref 3.5–5.0)
Alkaline Phosphatase: 1402 U/L — ABNORMAL HIGH (ref 38–126)
Anion gap: 10 (ref 5–15)
BUN: 27 mg/dL — ABNORMAL HIGH (ref 8–23)
CO2: 21 mmol/L — ABNORMAL LOW (ref 22–32)
Calcium: 8.4 mg/dL — ABNORMAL LOW (ref 8.9–10.3)
Chloride: 109 mmol/L (ref 98–111)
Creatinine: 1.25 mg/dL — ABNORMAL HIGH (ref 0.61–1.24)
GFR, Estimated: 53 mL/min — ABNORMAL LOW (ref >60)
Glucose, Bld: 151 mg/dL — ABNORMAL HIGH (ref 70–99)
Potassium: 4.2 mmol/L (ref 3.5–5.1)
Sodium: 140 mmol/L (ref 135–145)
Total Bilirubin: 0.5 mg/dL (ref 0.3–1.2)
Total Protein: 6.1 g/dL — ABNORMAL LOW (ref 6.5–8.1)

## 2021-11-05 MED ORDER — PREDNISONE 5 MG PO TABS: 5.0000 mg | ORAL_TABLET | Freq: Every day | ORAL | 2 refills | Status: DC

## 2021-11-05 MED ORDER — ROMIPLOSTIM 250 MCG ~~LOC~~ SOLR
2.0000 ug/kg | Freq: Once | SUBCUTANEOUS | Status: AC
  Administered 2021-11-05: 140 ug via SUBCUTANEOUS
  Filled 2021-11-05: qty 0.28

## 2021-11-05 MED ORDER — PREDNISONE 5 MG PO TABS
5.0000 mg | ORAL_TABLET | Freq: Every day | ORAL | 2 refills | Status: DC
  Filled 2021-11-05: qty 30, 30d supply, fill #0
  Filled 2021-11-25: qty 30, 30d supply, fill #1
  Filled 2021-12-30: qty 30, 30d supply, fill #2

## 2021-11-05 NOTE — Progress Notes (Signed)
[facility] OFFICE PROGRESS NOTE   Diagnosis: Thrombocytopenia, prostate cancer  INTERVAL HISTORY:   Mr. Matteucci returns as scheduled.  He continues Nplate.  He did not receive an injection while at the beach last [place].  He reports malaise and intermittent dizziness.  He has not started abiraterone/prednisone.  He wanted to wait until we discussed this further.  No pain.  Objective:  Vital signs in last 24 hours:  Blood pressure 131/62, pulse (!) 58, temperature 97.6 F (36.4 C), temperature source Oral, resp. rate 16, weight 157 lb (71.2 kg), SpO2 96 %.     Resp: Fine end inspiratory rales at the right greater than left posterior base, no respiratory distress Cardio: Regular rate and rhythm GI: No hepatosplenomegaly Vascular: No leg edema     Portacath/PICC-without erythema  Lab Results:  Lab Results  Component Value Date   WBC 9.3 11/05/2021   HGB 9.7 (L) 11/05/2021   HCT 30.7 (L) 11/05/2021   MCV 84.8 11/05/2021   PLT 99 (L) 11/05/2021   NEUTROABS 5.0 11/05/2021    CMP  Lab Results  Component Value Date   NA 140 11/05/2021   K 4.2 11/05/2021   CL 109 11/05/2021   CO2 21 (L) 11/05/2021   GLUCOSE 151 (H) 11/05/2021   BUN 27 (H) 11/05/2021   CREATININE 1.25 (H) 11/05/2021   CALCIUM 8.4 (L) 11/05/2021   PROT 6.1 (L) 11/05/2021   ALBUMIN 3.7 11/05/2021   AST 20 11/05/2021   ALT 13 11/05/2021   ALKPHOS 1,402 (H) 11/05/2021   BILITOT 0.5 11/05/2021   GFRNONAA 53 (L) 11/05/2021   GFRAA 59 (L) 10/31/2019    No results found for: "CEA1", "CEA", "ERX540", "CA125"  Lab Results  Component Value Date   INR 1.1 09/16/2019   LABPROT 13.8 09/16/2019    Imaging:  No results found.  Medications: I have reviewed the patient's current medications.   Assessment/Plan:  Thrombocytopenia Bone marrow biopsy 07/08/2016-no evidence of B-cell lymphoma, 40% cellular bone marrow with trilineage hematopoiesis, megakaryocytes present with normal morphology,  normal cytogenetics, negative for BRAF mutation Flow cytometry 01/05/2009-no monoclonal B-cell or phenotypically abnormal T-cell population Prednisone starting 06/29/2019 Nplate 07/04/2019, 0/86/7619, Nplate 07/18/2019 Prednisone 20 mg daily beginning 07/25/2019 Platelets 11,000 on 08/15/2019, prednisone increased to 40 mg daily, Nplate resume 07/09/3265  Prednisone decreased to 30 mg daily 08/25/2019, Nplate continued Prednisone continued at 30 mg daily 09/13/2019, weekly Nplate continued Prednisone taper to 20 mg daily 09/23/2019, weekly Nplate continued Prednisone taper to 10 mg daily 09/30/2019, weekly Nplate continued Prednisone taper to 5 mg daily 10/21/2019, weekly Nplate continued Prednisone taper to 5 mg every other day for 5 doses then stop 11/11/2019, weekly Nplate continued Nplate last given 03/27/5807 Promacta cost prohibitive Nplate beginning 11/08/3380, held 08/27/2020 and 09/03/2020 due to patient vacation Nplate resumed 07/06/3974 Nplate held 09/03/4191 secondary to an elevated platelet count Nplate resumed with dose reduction to 1 mcg/kg 10/09/2020 Nplate dose decreased to 0.5 mcg/kg 10/29/2020 Nplate dose increased to 1 mcg/kg 11/07/2020 Hairy cell leukemia 1982, status post splenectomy Coronary artery disease Aortic stenosis 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  Prostate cancer-sclerotic bone lesions/prostatic hypertrophy on CT 06/25/2021; Lupron every 3 months beginning 08/02/2021, bicalutamide 08/02/2021 for 2  weeks Abiraterone/prednisone 11/05/2021 16.  Pneumonia 09/30/2021      Disposition: Mr. Reiling appears stable.  The PSA was higher on 10/21/2021.  Alkaline phosphatase is elevated today.  I suspect these numbers indicate progression of prostate cancer.  He will begin abiraterone and prednisone.  He continues weekly Nplate for treatment of the chronic thrombocytopenia.  The platelet count is adequate today.  Mr. Sawin has recovered from recent pneumonia.  The left lung "mass "noted on the chest x-ray last month is likely related to the known chronic mass in the left lung.  He will continue weekly Nplate.  He will return for an office visit in 3 weeks.  He plans to obtain influenza, COVID-19, and pneumococcal 20 vaccines.  Betsy Coder, MD  11/05/2021  12:10 PM

## 2021-11-05 NOTE — Telephone Encounter (Signed)
Oral Chemotherapy Pharmacist Encounter  [name] plans on picking up the Zytiga and prednisone from [facility] today 11/05/21  Patient Education I spoke with patient's [name] for overview of new oral chemotherapy medication: Zytiga (abiraterone) for the treatment of metastatic prostate cancer in conjunction with prednisone and ADT, planned duration until disease progression or unacceptable drug toxicity.  Counseled [name] on administration, dosing, side effects, monitoring, drug-food interactions, safe handling, storage, and disposal. Patient will take: Abiraterone: Take 4 tablets (1,000 mg total) by mouth daily. Take on an empty stomach 1 hour before or 2 hours after a meal. Prednisone: Take 1 tablet (5 mg total) by mouth daily with breakfast.   Side effects include but not limited to: increased blood pressure, edema, fatigue.    Reviewed with [name] importance of keeping a medication schedule and plan for any missed doses.  After discussion with [name], no patient barriers to medication adherence identified.   [name] voiced understanding and appreciation. All questions answered. Medication handout provided.  Provided [name] with Oral Chemotherapy Navigation Clinic phone number. Patient knows to call the office with questions or concerns. Oral Chemotherapy Navigation Clinic will continue to follow.  Darl Pikes, PharmD, BCPS, BCOP, CPP Hematology/Oncology Clinical Pharmacist Practitioner [place]/DB/AP Oral [facility] [phone]  11/05/2021 2:50 PM

## 2021-11-06 LAB — PROSTATE-SPECIFIC AG, SERUM (LABCORP): Prostate Specific Ag, Serum: 304 ng/mL — ABNORMAL HIGH (ref 0.0–4.0)

## 2021-11-11 ENCOUNTER — Inpatient Hospital Stay: Payer: Medicare Other

## 2021-11-11 VITALS — BP 139/62 | HR 64 | Temp 98.1°F | Resp 17 | Ht 65.0 in | Wt 159.8 lb

## 2021-11-11 DIAGNOSIS — D696 Thrombocytopenia, unspecified: Secondary | ICD-10-CM | POA: Diagnosis not present

## 2021-11-11 DIAGNOSIS — C7951 Secondary malignant neoplasm of bone: Secondary | ICD-10-CM

## 2021-11-11 DIAGNOSIS — D693 Immune thrombocytopenic purpura: Secondary | ICD-10-CM

## 2021-11-11 DIAGNOSIS — Z79899 Other long term (current) drug therapy: Secondary | ICD-10-CM | POA: Diagnosis not present

## 2021-11-11 DIAGNOSIS — C61 Malignant neoplasm of prostate: Secondary | ICD-10-CM | POA: Diagnosis not present

## 2021-11-11 LAB — CBC WITH DIFFERENTIAL (CANCER CENTER ONLY)
Abs Immature Granulocytes: 0.75 10*3/uL — ABNORMAL HIGH (ref 0.00–0.07)
Basophils Absolute: 0.1 10*3/uL (ref 0.0–0.1)
Basophils Relative: 1 %
Eosinophils Absolute: 0.2 10*3/uL (ref 0.0–0.5)
Eosinophils Relative: 2 %
HCT: 30.3 % — ABNORMAL LOW (ref 39.0–52.0)
Hemoglobin: 9.4 g/dL — ABNORMAL LOW (ref 13.0–17.0)
Immature Granulocytes: 7 %
Lymphocytes Relative: 17 %
Lymphs Abs: 1.7 10*3/uL (ref 0.7–4.0)
MCH: 26.3 pg (ref 26.0–34.0)
MCHC: 31 g/dL (ref 30.0–36.0)
MCV: 84.6 fL (ref 80.0–100.0)
Monocytes Absolute: 2 10*3/uL — ABNORMAL HIGH (ref 0.1–1.0)
Monocytes Relative: 19 %
Neutro Abs: 5.5 10*3/uL (ref 1.7–7.7)
Neutrophils Relative %: 54 %
Platelet Count: 29 10*3/uL — ABNORMAL LOW (ref 150–400)
RBC: 3.58 MIL/uL — ABNORMAL LOW (ref 4.22–5.81)
RDW: 18.9 % — ABNORMAL HIGH (ref 11.5–15.5)
WBC Count: 10.2 10*3/uL (ref 4.0–10.5)
nRBC: 0.4 % — ABNORMAL HIGH (ref 0.0–0.2)

## 2021-11-11 MED ORDER — ROMIPLOSTIM 250 MCG ~~LOC~~ SOLR
3.0000 ug/kg | Freq: Once | SUBCUTANEOUS | Status: AC
  Administered 2021-11-11: 220 ug via SUBCUTANEOUS
  Filled 2021-11-11: qty 0.44

## 2021-11-11 NOTE — Patient Instructions (Signed)
Romiplostim Injection What is this medication? ROMIPLOSTIM (roe mi PLOE stim) treats low levels of platelets in your body caused by immune thrombocytopenia (ITP). It is prescribed when other medications have not worked or cannot be tolerated. It may also be used to help people who have been exposed to high doses of radiation. It works by increasing the amount of platelets in your blood. This lowers the risk of bleeding. This medicine may be used for other purposes; ask your health care provider or pharmacist if you have questions. COMMON BRAND NAME(S): Nplate What should I tell my care team before I take this medication? They need to know if you have any of these conditions: Blood clots Myelodysplastic syndrome An unusual or allergic reaction to romiplostim, mannitol, other medications, foods, dyes, or preservatives Pregnant or trying to get pregnant Breast-feeding How should I use this medication? This medication is injected under the skin. It is given by a care team in a hospital or clinic setting. A special MedGuide will be given to you before each treatment. Be sure to read this information carefully each time. Talk to your care team about the use of this medication in children. While it may be prescribed for children as young as newborns for selected conditions, precautions do apply. Overdosage: If you think you have taken too much of this medicine contact a poison control center or emergency room at once. NOTE: This medicine is only for you. Do not share this medicine with others. What if I miss a dose? Keep appointments for follow-up doses. It is important not to miss your dose. Call your care team if you are unable to keep an appointment. What may interact with this medication? Interactions are not expected. This list may not describe all possible interactions. Give your health care provider a list of all the medicines, herbs, non-prescription drugs, or dietary supplements you use. Also  tell them if you smoke, drink alcohol, or use illegal drugs. Some items may interact with your medicine. What should I watch for while using this medication? Visit your care team for regular checks on your progress. You may need blood work done while you are taking this medication. Your condition will be monitored carefully while you are receiving this medication. It is important not to miss any appointments. What side effects may I notice from receiving this medication? Side effects that you should report to your care team as soon as possible: Allergic reactions--skin rash, itching, hives, swelling of the face, lips, tongue, or throat Blood clot--pain, swelling, or warmth in the leg, shortness of breath, chest pain Side effects that usually do not require medical attention (report to your care team if they continue or are bothersome): Dizziness Joint pain Muscle pain Pain in the hands or feet Stomach pain Trouble sleeping This list may not describe all possible side effects. Call your doctor for medical advice about side effects. You may report side effects to FDA at 1-800-FDA-1088. Where should I keep my medication? This medication is given in a hospital or clinic. It will not be stored at home. NOTE: This sheet is a summary. It may not cover all possible information. If you have questions about this medicine, talk to your doctor, pharmacist, or health care provider.  2023 Elsevier/Gold Standard (2021-05-28 00:00:00)  

## 2021-11-14 ENCOUNTER — Other Ambulatory Visit: Payer: Self-pay | Admitting: *Deleted

## 2021-11-14 DIAGNOSIS — E785 Hyperlipidemia, unspecified: Secondary | ICD-10-CM

## 2021-11-18 ENCOUNTER — Inpatient Hospital Stay: Payer: Medicare Other

## 2021-11-18 VITALS — BP 126/56 | HR 67 | Temp 98.1°F | Resp 18 | Ht 65.0 in | Wt 158.2 lb

## 2021-11-18 DIAGNOSIS — D693 Immune thrombocytopenic purpura: Secondary | ICD-10-CM

## 2021-11-18 DIAGNOSIS — C61 Malignant neoplasm of prostate: Secondary | ICD-10-CM

## 2021-11-18 DIAGNOSIS — Z79899 Other long term (current) drug therapy: Secondary | ICD-10-CM | POA: Diagnosis not present

## 2021-11-18 DIAGNOSIS — D696 Thrombocytopenia, unspecified: Secondary | ICD-10-CM | POA: Diagnosis not present

## 2021-11-18 LAB — CBC WITH DIFFERENTIAL (CANCER CENTER ONLY)
Abs Immature Granulocytes: 0.97 10*3/uL — ABNORMAL HIGH (ref 0.00–0.07)
Basophils Absolute: 0.1 10*3/uL (ref 0.0–0.1)
Basophils Relative: 1 %
Eosinophils Absolute: 0.2 10*3/uL (ref 0.0–0.5)
Eosinophils Relative: 2 %
HCT: 30.5 % — ABNORMAL LOW (ref 39.0–52.0)
Hemoglobin: 9.6 g/dL — ABNORMAL LOW (ref 13.0–17.0)
Immature Granulocytes: 9 %
Lymphocytes Relative: 18 %
Lymphs Abs: 1.9 10*3/uL (ref 0.7–4.0)
MCH: 26.7 pg (ref 26.0–34.0)
MCHC: 31.5 g/dL (ref 30.0–36.0)
MCV: 85 fL (ref 80.0–100.0)
Monocytes Absolute: 1.8 10*3/uL — ABNORMAL HIGH (ref 0.1–1.0)
Monocytes Relative: 17 %
Neutro Abs: 5.7 10*3/uL (ref 1.7–7.7)
Neutrophils Relative %: 53 %
Platelet Count: 115 10*3/uL — ABNORMAL LOW (ref 150–400)
RBC: 3.59 MIL/uL — ABNORMAL LOW (ref 4.22–5.81)
RDW: 19.7 % — ABNORMAL HIGH (ref 11.5–15.5)
WBC Count: 10.5 10*3/uL (ref 4.0–10.5)
nRBC: 0.6 % — ABNORMAL HIGH (ref 0.0–0.2)

## 2021-11-18 MED ORDER — ROMIPLOSTIM 250 MCG ~~LOC~~ SOLR
2.0000 ug/kg | Freq: Once | SUBCUTANEOUS | Status: AC
  Administered 2021-11-18: 145 ug via SUBCUTANEOUS
  Filled 2021-11-18: qty 0.29

## 2021-11-18 NOTE — Patient Instructions (Signed)
Romiplostim Injection What is this medication? ROMIPLOSTIM (roe mi PLOE stim) treats low levels of platelets in your body caused by immune thrombocytopenia (ITP). It is prescribed when other medications have not worked or cannot be tolerated. It may also be used to help people who have been exposed to high doses of radiation. It works by increasing the amount of platelets in your blood. This lowers the risk of bleeding. This medicine may be used for other purposes; ask your health care provider or pharmacist if you have questions. COMMON BRAND NAME(S): Nplate What should I tell my care team before I take this medication? They need to know if you have any of these conditions: Blood clots Myelodysplastic syndrome An unusual or allergic reaction to romiplostim, mannitol, other medications, foods, dyes, or preservatives Pregnant or trying to get pregnant Breast-feeding How should I use this medication? This medication is injected under the skin. It is given by a care team in a hospital or clinic setting. A special MedGuide will be given to you before each treatment. Be sure to read this information carefully each time. Talk to your care team about the use of this medication in children. While it may be prescribed for children as young as newborns for selected conditions, precautions do apply. Overdosage: If you think you have taken too much of this medicine contact a poison control center or emergency room at once. NOTE: This medicine is only for you. Do not share this medicine with others. What if I miss a dose? Keep appointments for follow-up doses. It is important not to miss your dose. Call your care team if you are unable to keep an appointment. What may interact with this medication? Interactions are not expected. This list may not describe all possible interactions. Give your health care provider a list of all the medicines, herbs, non-prescription drugs, or dietary supplements you use. Also  tell them if you smoke, drink alcohol, or use illegal drugs. Some items may interact with your medicine. What should I watch for while using this medication? Visit your care team for regular checks on your progress. You may need blood work done while you are taking this medication. Your condition will be monitored carefully while you are receiving this medication. It is important not to miss any appointments. What side effects may I notice from receiving this medication? Side effects that you should report to your care team as soon as possible: Allergic reactions--skin rash, itching, hives, swelling of the face, lips, tongue, or throat Blood clot--pain, swelling, or warmth in the leg, shortness of breath, chest pain Side effects that usually do not require medical attention (report to your care team if they continue or are bothersome): Dizziness Joint pain Muscle pain Pain in the hands or feet Stomach pain Trouble sleeping This list may not describe all possible side effects. Call your doctor for medical advice about side effects. You may report side effects to FDA at 1-800-FDA-1088. Where should I keep my medication? This medication is given in a hospital or clinic. It will not be stored at home. NOTE: This sheet is a summary. It may not cover all possible information. If you have questions about this medicine, talk to your doctor, pharmacist, or health care provider.  2023 Elsevier/Gold Standard (2021-05-28 00:00:00)  

## 2021-11-21 ENCOUNTER — Telehealth: Payer: Self-pay

## 2021-11-21 ENCOUNTER — Ambulatory Visit: Payer: Self-pay

## 2021-11-21 DIAGNOSIS — H3530 Unspecified macular degeneration: Secondary | ICD-10-CM

## 2021-11-21 DIAGNOSIS — D696 Thrombocytopenia, unspecified: Secondary | ICD-10-CM

## 2021-11-21 DIAGNOSIS — C61 Malignant neoplasm of prostate: Secondary | ICD-10-CM

## 2021-11-21 NOTE — Patient Instructions (Signed)
Visit Information  Thank you for taking time to visit with me today. Please don't hesitate to contact me if I can be of assistance to you.   Following are the goals we discussed today:   Goals Addressed               This Visit's Progress     Patient Stated     COMPLETED: "To determine why I am still having shortness of breath" (pt-stated)        Care Coordination Interventions: Evaluation of current treatment plan related to dyspnea and patient's adherence to plan as established by provider Determined patient's shortness of breath secondary to Pneumonia has resolved Instructed patient to keep his doctor well informed of new symptoms or concerns      I was prescribed a new medication to help treat my prostate cancer (pt-stated)        Care Coordination Interventions: Assessed patient understanding of cancer diagnosis and recommended treatment plan Reviewed upcoming provider appointments and treatment appointments Assessed available transportation to appointments and treatments. Has consistent/reliable transportation: No Assessed support system. Has consistent/reliable family or other support: Yes Referred to [place] for assistance with medical transportation           Our next appointment is by telephone on 01/21/22 at 0930 AM  Please call the care guide team at [phone] if you need to cancel or reschedule your appointment.   If you are experiencing a Mental Health or [place] or need someone to talk to, please call 1-800-273-TALK (toll free, 24 hour hotline)  Patient verbalizes understanding of instructions and care plan provided today and agrees to view in [facility]. Active MyChart status and patient understanding of how to access instructions and care plan via MyChart confirmed with patient.     Barb Merino, RN, BSN, CCM Care Management Coordinator [facility] Direct Phone: [phone]

## 2021-11-21 NOTE — Patient Outreach (Signed)
  Care Coordination   Follow Up Visit Note   11/21/2021 Name: [name] MRN: [id number] DOB: [birth year]  [name] is a 88 y.o. year old male who sees [name], MD for primary care. I spoke with  [name] by phone today.  What matters to the patients health and wellness today?  Patient would like to continue his cancer treatment and stay healthy.     Goals Addressed               This Visit's Progress     Patient Stated     COMPLETED: "To determine why I am still having shortness of breath" (pt-stated)        Care Coordination Interventions: Evaluation of current treatment plan related to dyspnea and patient's adherence to plan as established by provider Determined patient's shortness of breath secondary to Pneumonia has resolved Instructed patient to keep his doctor well informed of new symptoms or concerns      I was prescribed a new medication to help treat my prostate cancer (pt-stated)        Care Coordination Interventions: Assessed patient understanding of cancer diagnosis and recommended treatment plan Reviewed upcoming provider appointments and treatment appointments Assessed available transportation to appointments and treatments. Has consistent/reliable transportation: No Assessed support system. 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 He would like to learn about his options for help with medical transporation. He has monthly visits with his Oncologist and other MD appts on a regular basis.]        Care Coordination Interventions Activated:  Yes  Care Coordination Interventions:  Yes,  provided   Follow up plan: Referral made to [facility] for resources to help with transportation Follow up call scheduled for 01/21/22 '@09'$ :30 AM     Encounter Outcome:  Pt. Visit Completed

## 2021-11-21 NOTE — Telephone Encounter (Signed)
   Telephone encounter was:  Successful.  11/21/2021 Name: [name] MRN: [id number] DOB: 07/13/1923  [name] is a 88 y.o. year old male who is a primary care patient of [name], MD . The community resource team was consulted for assistance with Transportation Needs   Care guide performed the following interventions: Patient provided with information about care guide support team and interviewed to confirm resource needs. CG spoke with patient and wife regarding transportation. They advised they are only interested in options for transportation which I have provided an education sheet by e-mail to spouse's e-mail address per request. At this time, they advised patient does not need any rides. I even inquired if a ride was needed for 11/25/2021 and they both stated no. CG provided [employer] options along with [employer] number for Transportation.  Follow Up Plan:  No further follow up planned at this time. The patient has been provided with needed resources.  [facility] management  [place], [place]  Main Phone: [phone]  E-mail: [email or web address]  Website: [email or web address]

## 2021-11-21 NOTE — Telephone Encounter (Signed)
   Telephone encounter was:  Unsuccessful.  11/21/2021 Name: [name] MRN: [id number] DOB: 18-Nov-1923  Unsuccessful outbound call made today to assist with:  Transportation Needs   Outreach Attempt:  1st Attempt  A HIPAA compliant voice message was left requesting a return call.  Instructed patient to call back at [phone] at their earliest convenience.  CG also verified that there are no transportation benefits under patient's Medicare and BCBS plan.   [facility] management  [place], [place]  Main Phone: [phone]  E-mail: [email or web address]  Website: [email or web address]

## 2021-11-22 ENCOUNTER — Other Ambulatory Visit (HOSPITAL_COMMUNITY): Payer: Self-pay

## 2021-11-25 ENCOUNTER — Inpatient Hospital Stay: Payer: Medicare Other

## 2021-11-25 ENCOUNTER — Other Ambulatory Visit: Payer: Self-pay | Admitting: Oncology

## 2021-11-25 ENCOUNTER — Inpatient Hospital Stay (HOSPITAL_BASED_OUTPATIENT_CLINIC_OR_DEPARTMENT_OTHER): Payer: Medicare Other | Admitting: Nurse Practitioner

## 2021-11-25 ENCOUNTER — Other Ambulatory Visit (HOSPITAL_COMMUNITY): Payer: Self-pay

## 2021-11-25 ENCOUNTER — Encounter: Payer: Self-pay | Admitting: Nurse Practitioner

## 2021-11-25 VITALS — BP 154/72 | HR 62 | Temp 98.1°F | Resp 18 | Ht 65.0 in | Wt 160.0 lb

## 2021-11-25 DIAGNOSIS — C7951 Secondary malignant neoplasm of bone: Secondary | ICD-10-CM

## 2021-11-25 DIAGNOSIS — R7989 Other specified abnormal findings of blood chemistry: Secondary | ICD-10-CM

## 2021-11-25 DIAGNOSIS — D696 Thrombocytopenia, unspecified: Secondary | ICD-10-CM | POA: Diagnosis not present

## 2021-11-25 DIAGNOSIS — C61 Malignant neoplasm of prostate: Secondary | ICD-10-CM

## 2021-11-25 DIAGNOSIS — E785 Hyperlipidemia, unspecified: Secondary | ICD-10-CM

## 2021-11-25 DIAGNOSIS — D693 Immune thrombocytopenic purpura: Secondary | ICD-10-CM

## 2021-11-25 DIAGNOSIS — N402 Nodular prostate without lower urinary tract symptoms: Secondary | ICD-10-CM

## 2021-11-25 DIAGNOSIS — Z79899 Other long term (current) drug therapy: Secondary | ICD-10-CM | POA: Diagnosis not present

## 2021-11-25 LAB — CMP (CANCER CENTER ONLY)
ALT: 15 U/L (ref 0–44)
AST: 19 U/L (ref 15–41)
Albumin: 3.8 g/dL (ref 3.5–5.0)
Alkaline Phosphatase: 1333 U/L — ABNORMAL HIGH (ref 38–126)
Anion gap: 10 (ref 5–15)
BUN: 29 mg/dL — ABNORMAL HIGH (ref 8–23)
CO2: 22 mmol/L (ref 22–32)
Calcium: 8.5 mg/dL — ABNORMAL LOW (ref 8.9–10.3)
Chloride: 106 mmol/L (ref 98–111)
Creatinine: 1.26 mg/dL — ABNORMAL HIGH (ref 0.61–1.24)
GFR, Estimated: 53 mL/min — ABNORMAL LOW (ref >60)
Glucose, Bld: 90 mg/dL (ref 70–99)
Potassium: 3.9 mmol/L (ref 3.5–5.1)
Sodium: 138 mmol/L (ref 135–145)
Total Bilirubin: 0.5 mg/dL (ref 0.3–1.2)
Total Protein: 5.9 g/dL — ABNORMAL LOW (ref 6.5–8.1)

## 2021-11-25 LAB — CBC WITH DIFFERENTIAL (CANCER CENTER ONLY)
Abs Immature Granulocytes: 0.9 10*3/uL — ABNORMAL HIGH (ref 0.00–0.07)
Basophils Absolute: 0.1 10*3/uL (ref 0.0–0.1)
Basophils Relative: 1 %
Eosinophils Absolute: 0.2 10*3/uL (ref 0.0–0.5)
Eosinophils Relative: 2 %
HCT: 30 % — ABNORMAL LOW (ref 39.0–52.0)
Hemoglobin: 9.4 g/dL — ABNORMAL LOW (ref 13.0–17.0)
Immature Granulocytes: 8 %
Lymphocytes Relative: 17 %
Lymphs Abs: 1.8 10*3/uL (ref 0.7–4.0)
MCH: 26.6 pg (ref 26.0–34.0)
MCHC: 31.3 g/dL (ref 30.0–36.0)
MCV: 84.7 fL (ref 80.0–100.0)
Monocytes Absolute: 2.1 10*3/uL — ABNORMAL HIGH (ref 0.1–1.0)
Monocytes Relative: 19 %
Neutro Abs: 5.7 10*3/uL (ref 1.7–7.7)
Neutrophils Relative %: 53 %
Platelet Count: 182 10*3/uL (ref 150–400)
RBC: 3.54 MIL/uL — ABNORMAL LOW (ref 4.22–5.81)
RDW: 19.9 % — ABNORMAL HIGH (ref 11.5–15.5)
WBC Count: 10.7 10*3/uL — ABNORMAL HIGH (ref 4.0–10.5)
nRBC: 0.3 % — ABNORMAL HIGH (ref 0.0–0.2)

## 2021-11-25 LAB — PHOSPHORUS: Phosphorus: 3.5 mg/dL (ref 2.5–4.6)

## 2021-11-25 LAB — TRIGLYCERIDES: Triglycerides: 194 mg/dL — ABNORMAL HIGH (ref <150)

## 2021-11-25 MED ORDER — ROMIPLOSTIM 250 MCG ~~LOC~~ SOLR
2.0000 ug/kg | Freq: Once | SUBCUTANEOUS | Status: AC
  Administered 2021-11-25: 145 ug via SUBCUTANEOUS
  Filled 2021-11-25: qty 0.29

## 2021-11-25 MED ORDER — ABIRATERONE ACETATE 250 MG PO TABS
1000.0000 mg | ORAL_TABLET | Freq: Every day | ORAL | 0 refills | Status: DC
  Filled 2021-11-25: qty 120, 30d supply, fill #0

## 2021-11-25 NOTE — Patient Instructions (Signed)
Romiplostim Injection What is this medication? ROMIPLOSTIM (roe mi PLOE stim) treats low levels of platelets in your body caused by immune thrombocytopenia (ITP). It is prescribed when other medications have not worked or cannot be tolerated. It may also be used to help people who have been exposed to high doses of radiation. It works by increasing the amount of platelets in your blood. This lowers the risk of bleeding. This medicine may be used for other purposes; ask your health care provider or pharmacist if you have questions. COMMON BRAND NAME(S): Nplate What should I tell my care team before I take this medication? They need to know if you have any of these conditions: Blood clots Myelodysplastic syndrome An unusual or allergic reaction to romiplostim, mannitol, other medications, foods, dyes, or preservatives Pregnant or trying to get pregnant Breast-feeding How should I use this medication? This medication is injected under the skin. It is given by a care team in a hospital or clinic setting. A special MedGuide will be given to you before each treatment. Be sure to read this information carefully each time. Talk to your care team about the use of this medication in children. While it may be prescribed for children as young as newborns for selected conditions, precautions do apply. Overdosage: If you think you have taken too much of this medicine contact a poison control center or emergency room at once. NOTE: This medicine is only for you. Do not share this medicine with others. What if I miss a dose? Keep appointments for follow-up doses. It is important not to miss your dose. Call your care team if you are unable to keep an appointment. What may interact with this medication? Interactions are not expected. This list may not describe all possible interactions. Give your health care provider a list of all the medicines, herbs, non-prescription drugs, or dietary supplements you use. Also  tell them if you smoke, drink alcohol, or use illegal drugs. Some items may interact with your medicine. What should I watch for while using this medication? Visit your care team for regular checks on your progress. You may need blood work done while you are taking this medication. Your condition will be monitored carefully while you are receiving this medication. It is important not to miss any appointments. What side effects may I notice from receiving this medication? Side effects that you should report to your care team as soon as possible: Allergic reactions--skin rash, itching, hives, swelling of the face, lips, tongue, or throat Blood clot--pain, swelling, or warmth in the leg, shortness of breath, chest pain Side effects that usually do not require medical attention (report to your care team if they continue or are bothersome): Dizziness Joint pain Muscle pain Pain in the hands or feet Stomach pain Trouble sleeping This list may not describe all possible side effects. Call your doctor for medical advice about side effects. You may report side effects to FDA at 1-800-FDA-1088. Where should I keep my medication? This medication is given in a hospital or clinic. It will not be stored at home. NOTE: This sheet is a summary. It may not cover all possible information. If you have questions about this medicine, talk to your doctor, pharmacist, or health care provider.  2023 Elsevier/Gold Standard (2021-05-28 00:00:00)  

## 2021-11-25 NOTE — Progress Notes (Signed)
[name]   Diagnosis: Thrombocytopenia, prostate cancer  INTERVAL HISTORY:   [name] returns as scheduled.  He continues Nplate.  He began abiraterone following last office visit.  He denies bleeding.  No hot flashes.  He has occasional mild back and bilateral hip pain.  He reports intermittent dizziness for the past 2 years.  Objective:  Vital signs in last 24 hours:  Blood pressure (!) 154/72, pulse 62, temperature 98.1 F (36.7 C), temperature source Oral, resp. rate 18, height $RemoveBe'5\' 5"'SUnNuYrDj$  (1.651 m), weight 160 lb (72.6 kg), SpO2 98 %.    HEENT: No thrush or ulcers. Resp: Fine end inspiratory Rales lung bases bilaterally right greater than left.  No respiratory distress. Cardio: Regular rate and rhythm. GI: Abdomen soft and nontender.  No hepatosplenomegaly. Vascular: Trace bilateral ankle/pedal edema. Neuro: Alert and oriented.   Lab Results:  Lab Results  Component Value Date   WBC 10.7 (H) 11/25/2021   HGB 9.4 (L) 11/25/2021   HCT 30.0 (L) 11/25/2021   MCV 84.7 11/25/2021   PLT 182 11/25/2021   NEUTROABS 5.7 11/25/2021    Imaging:  No results found.  Medications: I have reviewed the patient's current medications.  Assessment/Plan: Thrombocytopenia Bone marrow biopsy 07/08/2016-no evidence of B-cell lymphoma, 40% cellular bone marrow with trilineage hematopoiesis, megakaryocytes present with normal morphology, normal cytogenetics, negative for BRAF mutation Flow cytometry 01/05/2009-no monoclonal B-cell or phenotypically abnormal T-cell population Prednisone starting 06/29/2019 Nplate 07/04/2019, 2/42/6834, Nplate 07/18/2019 Prednisone 20 mg daily beginning 07/25/2019 Platelets 11,000 on 08/15/2019, prednisone increased to 40 mg daily, Nplate resume 1/96/2229  Prednisone decreased to 30 mg daily 08/25/2019, Nplate continued Prednisone continued at 30 mg daily 09/13/2019, weekly Nplate continued Prednisone taper to 20 mg daily  09/23/2019, weekly Nplate continued Prednisone taper to 10 mg daily 09/30/2019, weekly Nplate continued Prednisone taper to 5 mg daily 10/21/2019, weekly Nplate continued Prednisone taper to 5 mg every other day for 5 doses then stop 11/11/2019, weekly Nplate continued Nplate last given 7/98/9211 Promacta cost prohibitive Nplate beginning 11/05/1738, held 08/27/2020 and 09/03/2020 due to patient vacation Nplate resumed 10/14/4816 Nplate held 07/07/3147 secondary to an elevated platelet count Nplate resumed with dose reduction to 1 mcg/kg 10/09/2020 Nplate dose decreased to 0.5 mcg/kg 10/29/2020 Nplate dose increased to 1 mcg/kg 11/07/2020 Hairy cell leukemia 1982, status post splenectomy Coronary artery disease Aortic stenosis Liposarcoma at the right chest wall resected in 2013 Macular degeneration Hearing loss Basal cell carcinoma left cheek 05/24/2019 Left upper lobe nodule, faint FDG activity on PET at [facility] 05/31/2013 Status post TAVR procedure 09/06/2019 Admission with Pseudomonas urosepsis 09/16/2019 Acute left MCA distribution CVA 09/16/2019 Covid January 2022 Mild anemia, 1 of 3 stool Hemoccult cards positive for occult blood 10/14/2020, ferritin low 06/29/2020 Virtual colonoscopy 06/25/2021 -5 mm polypoid defect in the ascending colon 15.  Prostate cancer-sclerotic bone lesions/prostatic hypertrophy on CT 06/25/2021; Lupron every 3 months beginning 08/02/2021, bicalutamide 08/02/2021 for 2 weeks Abiraterone/prednisone 11/05/2021 Lupron every 3 months 16.  Pneumonia 09/30/2021    Disposition: [name] appears stable.  He has chronic thrombocytopenia, maintained on weekly Nplate.  Plan to continue the same.  He has prostate cancer, began abiraterone and prednisone about 3 weeks ago.  Seems to be tolerating well.  We will follow-up on the PSA from today.  We will see him in follow-up in 4 weeks.  He will contact the office in the interim with any problems.    [name] ANP/GNP-BC   11/25/2021  9:14  AM

## 2021-11-25 NOTE — Progress Notes (Signed)
Patient seen by Lisa Thomas NP today  Vitals are within treatment parameters.  Labs reviewed by Lisa Thomas NP and are within treatment parameters.  Per physician team, patient is ready for treatment and there are NO modifications to the treatment plan.     

## 2021-11-26 DIAGNOSIS — H35412 Lattice degeneration of retina, left eye: Secondary | ICD-10-CM | POA: Diagnosis not present

## 2021-11-26 DIAGNOSIS — D2260 Melanocytic nevi of unspecified upper limb, including shoulder: Secondary | ICD-10-CM | POA: Diagnosis not present

## 2021-11-26 DIAGNOSIS — L570 Actinic keratosis: Secondary | ICD-10-CM | POA: Diagnosis not present

## 2021-11-26 DIAGNOSIS — D2270 Melanocytic nevi of unspecified lower limb, including hip: Secondary | ICD-10-CM | POA: Diagnosis not present

## 2021-11-26 DIAGNOSIS — D225 Melanocytic nevi of trunk: Secondary | ICD-10-CM | POA: Diagnosis not present

## 2021-11-26 DIAGNOSIS — L218 Other seborrheic dermatitis: Secondary | ICD-10-CM | POA: Diagnosis not present

## 2021-11-26 DIAGNOSIS — Z961 Presence of intraocular lens: Secondary | ICD-10-CM | POA: Diagnosis not present

## 2021-11-26 DIAGNOSIS — L820 Inflamed seborrheic keratosis: Secondary | ICD-10-CM | POA: Diagnosis not present

## 2021-11-26 DIAGNOSIS — Z85828 Personal history of other malignant neoplasm of skin: Secondary | ICD-10-CM | POA: Diagnosis not present

## 2021-11-26 DIAGNOSIS — H353221 Exudative age-related macular degeneration, left eye, with active choroidal neovascularization: Secondary | ICD-10-CM | POA: Diagnosis not present

## 2021-11-26 DIAGNOSIS — H43821 Vitreomacular adhesion, right eye: Secondary | ICD-10-CM | POA: Diagnosis not present

## 2021-11-26 DIAGNOSIS — L821 Other seborrheic keratosis: Secondary | ICD-10-CM | POA: Diagnosis not present

## 2021-11-26 DIAGNOSIS — H43812 Vitreous degeneration, left eye: Secondary | ICD-10-CM | POA: Diagnosis not present

## 2021-11-26 DIAGNOSIS — H35373 Puckering of macula, bilateral: Secondary | ICD-10-CM | POA: Diagnosis not present

## 2021-11-26 DIAGNOSIS — H353110 Nonexudative age-related macular degeneration, right eye, stage unspecified: Secondary | ICD-10-CM | POA: Diagnosis not present

## 2021-11-29 ENCOUNTER — Other Ambulatory Visit (HOSPITAL_COMMUNITY): Payer: Self-pay

## 2021-12-02 ENCOUNTER — Inpatient Hospital Stay: Payer: Medicare Other

## 2021-12-02 ENCOUNTER — Other Ambulatory Visit (HOSPITAL_COMMUNITY): Payer: Self-pay

## 2021-12-02 ENCOUNTER — Inpatient Hospital Stay: Payer: Medicare Other | Attending: Nurse Practitioner

## 2021-12-02 VITALS — BP 142/60 | HR 61 | Temp 98.2°F | Resp 17 | Ht 65.0 in | Wt 162.6 lb

## 2021-12-02 DIAGNOSIS — D696 Thrombocytopenia, unspecified: Secondary | ICD-10-CM | POA: Insufficient documentation

## 2021-12-02 DIAGNOSIS — I2510 Atherosclerotic heart disease of native coronary artery without angina pectoris: Secondary | ICD-10-CM | POA: Diagnosis not present

## 2021-12-02 DIAGNOSIS — C61 Malignant neoplasm of prostate: Secondary | ICD-10-CM | POA: Diagnosis not present

## 2021-12-02 DIAGNOSIS — D693 Immune thrombocytopenic purpura: Secondary | ICD-10-CM

## 2021-12-02 LAB — CBC WITH DIFFERENTIAL (CANCER CENTER ONLY)
Abs Immature Granulocytes: 0.66 10*3/uL — ABNORMAL HIGH (ref 0.00–0.07)
Basophils Absolute: 0.1 10*3/uL (ref 0.0–0.1)
Basophils Relative: 1 %
Eosinophils Absolute: 0.1 10*3/uL (ref 0.0–0.5)
Eosinophils Relative: 1 %
HCT: 29.3 % — ABNORMAL LOW (ref 39.0–52.0)
Hemoglobin: 9.1 g/dL — ABNORMAL LOW (ref 13.0–17.0)
Immature Granulocytes: 7 %
Lymphocytes Relative: 17 %
Lymphs Abs: 1.7 10*3/uL (ref 0.7–4.0)
MCH: 26.7 pg (ref 26.0–34.0)
MCHC: 31.1 g/dL (ref 30.0–36.0)
MCV: 85.9 fL (ref 80.0–100.0)
Monocytes Absolute: 2.2 10*3/uL — ABNORMAL HIGH (ref 0.1–1.0)
Monocytes Relative: 22 %
Neutro Abs: 5.2 10*3/uL (ref 1.7–7.7)
Neutrophils Relative %: 52 %
Platelet Count: 107 10*3/uL — ABNORMAL LOW (ref 150–400)
RBC: 3.41 MIL/uL — ABNORMAL LOW (ref 4.22–5.81)
RDW: 20 % — ABNORMAL HIGH (ref 11.5–15.5)
WBC Count: 10 10*3/uL (ref 4.0–10.5)
nRBC: 0.4 % — ABNORMAL HIGH (ref 0.0–0.2)

## 2021-12-02 MED ORDER — ROMIPLOSTIM 250 MCG ~~LOC~~ SOLR
2.0000 ug/kg | Freq: Once | SUBCUTANEOUS | Status: AC
  Administered 2021-12-02: 150 ug via SUBCUTANEOUS
  Filled 2021-12-02: qty 0.3

## 2021-12-02 NOTE — Patient Instructions (Signed)
Romiplostim Injection What is this medication? ROMIPLOSTIM (roe mi PLOE stim) treats low levels of platelets in your body caused by immune thrombocytopenia (ITP). It is prescribed when other medications have not worked or cannot be tolerated. It may also be used to help people who have been exposed to high doses of radiation. It works by increasing the amount of platelets in your blood. This lowers the risk of bleeding. This medicine may be used for other purposes; ask your health care provider or pharmacist if you have questions. COMMON BRAND NAME(S): Nplate What should I tell my care team before I take this medication? They need to know if you have any of these conditions: Blood clots Myelodysplastic syndrome An unusual or allergic reaction to romiplostim, mannitol, other medications, foods, dyes, or preservatives Pregnant or trying to get pregnant Breast-feeding How should I use this medication? This medication is injected under the skin. It is given by a care team in a hospital or clinic setting. A special MedGuide will be given to you before each treatment. Be sure to read this information carefully each time. Talk to your care team about the use of this medication in children. While it may be prescribed for children as young as newborns for selected conditions, precautions do apply. Overdosage: If you think you have taken too much of this medicine contact a poison control center or emergency room at once. NOTE: This medicine is only for you. Do not share this medicine with others. What if I miss a dose? Keep appointments for follow-up doses. It is important not to miss your dose. Call your care team if you are unable to keep an appointment. What may interact with this medication? Interactions are not expected. This list may not describe all possible interactions. Give your health care provider a list of all the medicines, herbs, non-prescription drugs, or dietary supplements you use. Also  tell them if you smoke, drink alcohol, or use illegal drugs. Some items may interact with your medicine. What should I watch for while using this medication? Visit your care team for regular checks on your progress. You may need blood work done while you are taking this medication. Your condition will be monitored carefully while you are receiving this medication. It is important not to miss any appointments. What side effects may I notice from receiving this medication? Side effects that you should report to your care team as soon as possible: Allergic reactions--skin rash, itching, hives, swelling of the face, lips, tongue, or throat Blood clot--pain, swelling, or warmth in the leg, shortness of breath, chest pain Side effects that usually do not require medical attention (report to your care team if they continue or are bothersome): Dizziness Joint pain Muscle pain Pain in the hands or feet Stomach pain Trouble sleeping This list may not describe all possible side effects. Call your doctor for medical advice about side effects. You may report side effects to FDA at 1-800-FDA-1088. Where should I keep my medication? This medication is given in a hospital or clinic. It will not be stored at home. NOTE: This sheet is a summary. It may not cover all possible information. If you have questions about this medicine, talk to your doctor, pharmacist, or health care provider.  2023 Elsevier/Gold Standard (2021-05-28 00:00:00)  

## 2021-12-03 DIAGNOSIS — Z192 Hormone resistant malignancy status: Secondary | ICD-10-CM | POA: Diagnosis not present

## 2021-12-03 DIAGNOSIS — I480 Paroxysmal atrial fibrillation: Secondary | ICD-10-CM | POA: Diagnosis not present

## 2021-12-03 DIAGNOSIS — R2681 Unsteadiness on feet: Secondary | ICD-10-CM | POA: Diagnosis not present

## 2021-12-03 DIAGNOSIS — H04123 Dry eye syndrome of bilateral lacrimal glands: Secondary | ICD-10-CM | POA: Diagnosis not present

## 2021-12-03 DIAGNOSIS — D696 Thrombocytopenia, unspecified: Secondary | ICD-10-CM | POA: Diagnosis not present

## 2021-12-03 DIAGNOSIS — I2510 Atherosclerotic heart disease of native coronary artery without angina pectoris: Secondary | ICD-10-CM | POA: Diagnosis not present

## 2021-12-03 DIAGNOSIS — R5383 Other fatigue: Secondary | ICD-10-CM | POA: Diagnosis not present

## 2021-12-03 DIAGNOSIS — Z23 Encounter for immunization: Secondary | ICD-10-CM | POA: Diagnosis not present

## 2021-12-03 DIAGNOSIS — C61 Malignant neoplasm of prostate: Secondary | ICD-10-CM | POA: Diagnosis not present

## 2021-12-03 DIAGNOSIS — I10 Essential (primary) hypertension: Secondary | ICD-10-CM | POA: Diagnosis not present

## 2021-12-04 ENCOUNTER — Encounter: Payer: Self-pay | Admitting: Oncology

## 2021-12-05 LAB — PROSTATE-SPECIFIC AG, SERUM (LABCORP)

## 2021-12-09 ENCOUNTER — Inpatient Hospital Stay: Payer: Medicare Other

## 2021-12-09 VITALS — BP 138/66 | HR 71 | Temp 97.7°F | Resp 18 | Ht 65.0 in | Wt 162.0 lb

## 2021-12-09 DIAGNOSIS — D696 Thrombocytopenia, unspecified: Secondary | ICD-10-CM | POA: Diagnosis not present

## 2021-12-09 DIAGNOSIS — D693 Immune thrombocytopenic purpura: Secondary | ICD-10-CM

## 2021-12-09 DIAGNOSIS — C7951 Secondary malignant neoplasm of bone: Secondary | ICD-10-CM

## 2021-12-09 DIAGNOSIS — I2510 Atherosclerotic heart disease of native coronary artery without angina pectoris: Secondary | ICD-10-CM | POA: Diagnosis not present

## 2021-12-09 DIAGNOSIS — C61 Malignant neoplasm of prostate: Secondary | ICD-10-CM | POA: Diagnosis not present

## 2021-12-09 LAB — CBC WITH DIFFERENTIAL (CANCER CENTER ONLY)
Abs Immature Granulocytes: 0.71 10*3/uL — ABNORMAL HIGH (ref 0.00–0.07)
Basophils Absolute: 0.1 10*3/uL (ref 0.0–0.1)
Basophils Relative: 1 %
Eosinophils Absolute: 0.1 10*3/uL (ref 0.0–0.5)
Eosinophils Relative: 1 %
HCT: 30.1 % — ABNORMAL LOW (ref 39.0–52.0)
Hemoglobin: 9.5 g/dL — ABNORMAL LOW (ref 13.0–17.0)
Immature Granulocytes: 8 %
Lymphocytes Relative: 18 %
Lymphs Abs: 1.7 10*3/uL (ref 0.7–4.0)
MCH: 27.4 pg (ref 26.0–34.0)
MCHC: 31.6 g/dL (ref 30.0–36.0)
MCV: 86.7 fL (ref 80.0–100.0)
Monocytes Absolute: 1.6 10*3/uL — ABNORMAL HIGH (ref 0.1–1.0)
Monocytes Relative: 17 %
Neutro Abs: 5.2 10*3/uL (ref 1.7–7.7)
Neutrophils Relative %: 55 %
Platelet Count: 90 10*3/uL — ABNORMAL LOW (ref 150–400)
RBC: 3.47 MIL/uL — ABNORMAL LOW (ref 4.22–5.81)
RDW: 19.7 % — ABNORMAL HIGH (ref 11.5–15.5)
WBC Count: 9.5 10*3/uL (ref 4.0–10.5)
nRBC: 0.4 % — ABNORMAL HIGH (ref 0.0–0.2)

## 2021-12-09 MED ORDER — ROMIPLOSTIM 250 MCG ~~LOC~~ SOLR
2.0000 ug/kg | Freq: Once | SUBCUTANEOUS | Status: AC
  Administered 2021-12-09: 150 ug via SUBCUTANEOUS
  Filled 2021-12-09: qty 0.3

## 2021-12-09 NOTE — Patient Instructions (Signed)
Romiplostim Injection What is this medication? ROMIPLOSTIM (roe mi PLOE stim) treats low levels of platelets in your body caused by immune thrombocytopenia (ITP). It is prescribed when other medications have not worked or cannot be tolerated. It may also be used to help people who have been exposed to high doses of radiation. It works by increasing the amount of platelets in your blood. This lowers the risk of bleeding. This medicine may be used for other purposes; ask your health care provider or pharmacist if you have questions. COMMON BRAND NAME(S): Nplate What should I tell my care team before I take this medication? They need to know if you have any of these conditions: Blood clots Myelodysplastic syndrome An unusual or allergic reaction to romiplostim, mannitol, other medications, foods, dyes, or preservatives Pregnant or trying to get pregnant Breast-feeding How should I use this medication? This medication is injected under the skin. It is given by a care team in a hospital or clinic setting. A special MedGuide will be given to you before each treatment. Be sure to read this information carefully each time. Talk to your care team about the use of this medication in children. While it may be prescribed for children as young as newborns for selected conditions, precautions do apply. Overdosage: If you think you have taken too much of this medicine contact a poison control center or emergency room at once. NOTE: This medicine is only for you. Do not share this medicine with others. What if I miss a dose? Keep appointments for follow-up doses. It is important not to miss your dose. Call your care team if you are unable to keep an appointment. What may interact with this medication? Interactions are not expected. This list may not describe all possible interactions. Give your health care provider a list of all the medicines, herbs, non-prescription drugs, or dietary supplements you use. Also  tell them if you smoke, drink alcohol, or use illegal drugs. Some items may interact with your medicine. What should I watch for while using this medication? Visit your care team for regular checks on your progress. You may need blood work done while you are taking this medication. Your condition will be monitored carefully while you are receiving this medication. It is important not to miss any appointments. What side effects may I notice from receiving this medication? Side effects that you should report to your care team as soon as possible: Allergic reactions--skin rash, itching, hives, swelling of the face, lips, tongue, or throat Blood clot--pain, swelling, or warmth in the leg, shortness of breath, chest pain Side effects that usually do not require medical attention (report to your care team if they continue or are bothersome): Dizziness Joint pain Muscle pain Pain in the hands or feet Stomach pain Trouble sleeping This list may not describe all possible side effects. Call your doctor for medical advice about side effects. You may report side effects to FDA at 1-800-FDA-1088. Where should I keep my medication? This medication is given in a hospital or clinic. It will not be stored at home. NOTE: This sheet is a summary. It may not cover all possible information. If you have questions about this medicine, talk to your doctor, pharmacist, or health care provider.  2023 Elsevier/Gold Standard (2021-05-28 00:00:00)  

## 2021-12-16 ENCOUNTER — Inpatient Hospital Stay: Payer: Medicare Other

## 2021-12-16 VITALS — BP 126/57 | HR 64 | Temp 98.0°F | Resp 18 | Ht 65.0 in | Wt 161.8 lb

## 2021-12-16 DIAGNOSIS — D693 Immune thrombocytopenic purpura: Secondary | ICD-10-CM

## 2021-12-16 DIAGNOSIS — C61 Malignant neoplasm of prostate: Secondary | ICD-10-CM

## 2021-12-16 DIAGNOSIS — I2510 Atherosclerotic heart disease of native coronary artery without angina pectoris: Secondary | ICD-10-CM | POA: Diagnosis not present

## 2021-12-16 DIAGNOSIS — D696 Thrombocytopenia, unspecified: Secondary | ICD-10-CM | POA: Diagnosis not present

## 2021-12-16 LAB — CBC WITH DIFFERENTIAL (CANCER CENTER ONLY)
Abs Immature Granulocytes: 0.54 10*3/uL — ABNORMAL HIGH (ref 0.00–0.07)
Basophils Absolute: 0.1 10*3/uL (ref 0.0–0.1)
Basophils Relative: 1 %
Eosinophils Absolute: 0.1 10*3/uL (ref 0.0–0.5)
Eosinophils Relative: 1 %
HCT: 28.8 % — ABNORMAL LOW (ref 39.0–52.0)
Hemoglobin: 9.1 g/dL — ABNORMAL LOW (ref 13.0–17.0)
Immature Granulocytes: 5 %
Lymphocytes Relative: 11 %
Lymphs Abs: 1.1 10*3/uL (ref 0.7–4.0)
MCH: 26.8 pg (ref 26.0–34.0)
MCHC: 31.6 g/dL (ref 30.0–36.0)
MCV: 85 fL (ref 80.0–100.0)
Monocytes Absolute: 2.2 10*3/uL — ABNORMAL HIGH (ref 0.1–1.0)
Monocytes Relative: 20 %
Neutro Abs: 6.7 10*3/uL (ref 1.7–7.7)
Neutrophils Relative %: 62 %
Platelet Count: 83 10*3/uL — ABNORMAL LOW (ref 150–400)
RBC: 3.39 MIL/uL — ABNORMAL LOW (ref 4.22–5.81)
RDW: 19.1 % — ABNORMAL HIGH (ref 11.5–15.5)
WBC Count: 10.7 10*3/uL — ABNORMAL HIGH (ref 4.0–10.5)
nRBC: 0.7 % — ABNORMAL HIGH (ref 0.0–0.2)

## 2021-12-16 MED ORDER — ROMIPLOSTIM 250 MCG ~~LOC~~ SOLR
2.0000 ug/kg | Freq: Once | SUBCUTANEOUS | Status: AC
  Administered 2021-12-16: 145 ug via SUBCUTANEOUS
  Filled 2021-12-16: qty 0.29

## 2021-12-17 LAB — PROSTATE-SPECIFIC AG, SERUM (LABCORP): Prostate Specific Ag, Serum: 457 ng/mL — ABNORMAL HIGH (ref 0.0–4.0)

## 2021-12-18 ENCOUNTER — Other Ambulatory Visit (HOSPITAL_COMMUNITY): Payer: Self-pay

## 2021-12-19 ENCOUNTER — Other Ambulatory Visit (HOSPITAL_COMMUNITY): Payer: Self-pay

## 2021-12-23 ENCOUNTER — Inpatient Hospital Stay: Payer: Medicare Other | Admitting: Oncology

## 2021-12-23 ENCOUNTER — Inpatient Hospital Stay: Payer: Medicare Other

## 2021-12-24 ENCOUNTER — Other Ambulatory Visit (HOSPITAL_COMMUNITY): Payer: Self-pay

## 2021-12-24 ENCOUNTER — Other Ambulatory Visit: Payer: Self-pay | Admitting: *Deleted

## 2021-12-24 DIAGNOSIS — D693 Immune thrombocytopenic purpura: Secondary | ICD-10-CM

## 2021-12-26 ENCOUNTER — Inpatient Hospital Stay (HOSPITAL_BASED_OUTPATIENT_CLINIC_OR_DEPARTMENT_OTHER): Payer: Medicare Other | Admitting: Oncology

## 2021-12-26 ENCOUNTER — Inpatient Hospital Stay: Payer: Medicare Other

## 2021-12-26 VITALS — BP 118/62 | HR 58 | Temp 98.1°F | Resp 18 | Ht 65.0 in | Wt 161.8 lb

## 2021-12-26 DIAGNOSIS — R7989 Other specified abnormal findings of blood chemistry: Secondary | ICD-10-CM

## 2021-12-26 DIAGNOSIS — C61 Malignant neoplasm of prostate: Secondary | ICD-10-CM

## 2021-12-26 DIAGNOSIS — C7951 Secondary malignant neoplasm of bone: Secondary | ICD-10-CM | POA: Diagnosis not present

## 2021-12-26 DIAGNOSIS — D693 Immune thrombocytopenic purpura: Secondary | ICD-10-CM

## 2021-12-26 DIAGNOSIS — I2510 Atherosclerotic heart disease of native coronary artery without angina pectoris: Secondary | ICD-10-CM | POA: Diagnosis not present

## 2021-12-26 DIAGNOSIS — D696 Thrombocytopenia, unspecified: Secondary | ICD-10-CM | POA: Diagnosis not present

## 2021-12-26 LAB — CMP (CANCER CENTER ONLY)
ALT: 13 U/L (ref 0–44)
AST: 17 U/L (ref 15–41)
Albumin: 3.7 g/dL (ref 3.5–5.0)
Alkaline Phosphatase: 983 U/L — ABNORMAL HIGH (ref 38–126)
Anion gap: 8 (ref 5–15)
BUN: 23 mg/dL (ref 8–23)
CO2: 23 mmol/L (ref 22–32)
Calcium: 8.3 mg/dL — ABNORMAL LOW (ref 8.9–10.3)
Chloride: 108 mmol/L (ref 98–111)
Creatinine: 1.33 mg/dL — ABNORMAL HIGH (ref 0.61–1.24)
GFR, Estimated: 49 mL/min — ABNORMAL LOW (ref >60)
Glucose, Bld: 130 mg/dL — ABNORMAL HIGH (ref 70–99)
Potassium: 4 mmol/L (ref 3.5–5.1)
Sodium: 139 mmol/L (ref 135–145)
Total Bilirubin: 0.4 mg/dL (ref 0.3–1.2)
Total Protein: 5.8 g/dL — ABNORMAL LOW (ref 6.5–8.1)

## 2021-12-26 LAB — CBC WITH DIFFERENTIAL (CANCER CENTER ONLY)
Abs Immature Granulocytes: 0.5 10*3/uL — ABNORMAL HIGH (ref 0.00–0.07)
Basophils Absolute: 0 10*3/uL (ref 0.0–0.1)
Basophils Relative: 0 %
Eosinophils Absolute: 0 10*3/uL (ref 0.0–0.5)
Eosinophils Relative: 0 %
HCT: 29.2 % — ABNORMAL LOW (ref 39.0–52.0)
Hemoglobin: 9 g/dL — ABNORMAL LOW (ref 13.0–17.0)
Lymphocytes Relative: 9 %
Lymphs Abs: 0.9 10*3/uL (ref 0.7–4.0)
MCH: 26.8 pg (ref 26.0–34.0)
MCHC: 30.8 g/dL (ref 30.0–36.0)
MCV: 86.9 fL (ref 80.0–100.0)
Metamyelocytes Relative: 2 %
Monocytes Absolute: 0.9 10*3/uL (ref 0.1–1.0)
Monocytes Relative: 9 %
Myelocytes: 3 %
Neutro Abs: 7.5 10*3/uL (ref 1.7–7.7)
Neutrophils Relative %: 77 %
Platelet Count: 121 10*3/uL — ABNORMAL LOW (ref 150–400)
RBC: 3.36 MIL/uL — ABNORMAL LOW (ref 4.22–5.81)
RDW: 19.3 % — ABNORMAL HIGH (ref 11.5–15.5)
Smear Review: DECREASED
WBC Count: 9.7 10*3/uL (ref 4.0–10.5)
nRBC: 0.4 % — ABNORMAL HIGH (ref 0.0–0.2)
nRBC: 1 /100 WBC — ABNORMAL HIGH

## 2021-12-26 LAB — TRIGLYCERIDES: Triglycerides: 184 mg/dL — ABNORMAL HIGH (ref <150)

## 2021-12-26 LAB — PHOSPHORUS: Phosphorus: 3.1 mg/dL (ref 2.5–4.6)

## 2021-12-26 MED ORDER — ROMIPLOSTIM 250 MCG ~~LOC~~ SOLR
2.0000 ug/kg | Freq: Once | SUBCUTANEOUS | Status: AC
  Administered 2021-12-26: 145 ug via SUBCUTANEOUS
  Filled 2021-12-26: qty 0.29

## 2021-12-26 NOTE — Progress Notes (Signed)
[facility] OFFICE PROGRESS NOTE   Diagnosis: Thrombocytopenia, prostate cancer  INTERVAL HISTORY:   [name] returns as scheduled.  Continues weekly Nplate.  He had an episode of severe low back pain 2 weeks ago.  He now takes 12.5 mg of tramadol in the evening.  The pain has improved.  He had a cough this morning.  Objective:  Vital signs in last 24 hours:  Blood pressure 118/62, pulse (!) 58, temperature 98.1 F (36.7 C), temperature source Oral, resp. rate 18, height _0  (1.651 m), weight 161 lb 12.8 oz (73.4 kg), SpO2 98 %.    HEENT: Mild white coat over the tongue, no buccal thrush Resp: End inspiratory fine rales at the right greater than left lower posterior chest, no respiratory distress Cardio: Regular rate and rhythm GI: No hepatomegaly Vascular: 1+ edema at the left greater than right lower leg and ankle   Lab Results:  Lab Results  Component Value Date   WBC 9.7 12/26/2021   HGB 9.0 (L) 12/26/2021   HCT 29.2 (L) 12/26/2021   MCV 86.9 12/26/2021   PLT 121 (L) 12/26/2021   NEUTROABS 7.5 12/26/2021    CMP  Lab Results  Component Value Date   NA 139 12/26/2021   K 4.0 12/26/2021   CL 108 12/26/2021   CO2 23 12/26/2021   GLUCOSE 130 (H) 12/26/2021   BUN 23 12/26/2021   CREATININE 1.33 (H) 12/26/2021   CALCIUM 8.3 (L) 12/26/2021   PROT 5.8 (L) 12/26/2021   ALBUMIN 3.7 12/26/2021   AST 17 12/26/2021   ALT 13 12/26/2021   ALKPHOS 983 (H) 12/26/2021   BILITOT 0.4 12/26/2021   GFRNONAA 49 (L) 12/26/2021   GFRAA 59 (L) 10/31/2019    Medications: I have reviewed the patient's current medications.   Assessment/Plan: Thrombocytopenia Bone marrow biopsy 07/08/2016-no evidence of B-cell lymphoma, 40% cellular bone marrow with trilineage hematopoiesis, megakaryocytes present with normal morphology, normal cytogenetics, negative for BRAF mutation Flow cytometry 01/05/2009-no monoclonal B-cell or phenotypically abnormal T-cell  population Prednisone starting 06/29/2019 Nplate 07/04/2019, 6/78/9381, Nplate 07/18/2019 Prednisone 20 mg daily beginning 07/25/2019 Platelets 11,000 on 08/15/2019, prednisone increased to 40 mg daily, Nplate resume 0/17/5102  Prednisone decreased to 30 mg daily 08/25/2019, Nplate continued Prednisone continued at 30 mg daily 09/13/2019, weekly Nplate continued Prednisone taper to 20 mg daily 09/23/2019, weekly Nplate continued Prednisone taper to 10 mg daily 09/30/2019, weekly Nplate continued Prednisone taper to 5 mg daily 10/21/2019, weekly Nplate continued Prednisone taper to 5 mg every other day for 5 doses then stop 11/11/2019, weekly Nplate continued Nplate last given 5/85/2778 Promacta cost prohibitive Nplate beginning 04/07/2351, held 08/27/2020 and 09/03/2020 due to patient vacation Nplate resumed 08/14/4313 Nplate held 4/0/0867 secondary to an elevated platelet count Nplate resumed with dose reduction to 1 mcg/kg 10/09/2020 Nplate dose decreased to 0.5 mcg/kg 10/29/2020 Nplate dose increased to 1 mcg/kg 11/07/2020 Hairy cell leukemia 1982, status post splenectomy Coronary artery disease Aortic stenosis Liposarcoma at the right chest wall resected in 2013 Macular degeneration Hearing loss Basal cell carcinoma left cheek 05/24/2019 Left upper lobe nodule, faint FDG activity on PET at [facility] 05/31/2013 Status post TAVR procedure 09/06/2019 Admission with Pseudomonas urosepsis 09/16/2019 Acute left MCA distribution CVA 09/16/2019 Covid January 2022 Mild anemia, 1 of 3 stool Hemoccult cards positive for occult blood 10/14/2020, ferritin low 06/29/2020 Virtual colonoscopy 06/25/2021 -5 mm polypoid defect in the ascending colon 15.  Prostate cancer-sclerotic bone lesions/prostatic hypertrophy on CT 06/25/2021; Lupron every 3 months  beginning 08/02/2021, bicalutamide 08/02/2021 for 2 weeks Abiraterone/prednisone 11/05/2021 Lupron every 3 months beginning 08/02/2021 16.  Pneumonia 09/30/2021     Disposition: [name] appears unchanged.  I suspect the back pain is related to prostate cancer.  He has been maintained on Lupron since June and abiraterone since September.  The PSA was higher on 12/16/2021.  We will check a testosterone level today.  He continues weekly Nplate.  He will call if the cough does not improve or he develops new symptoms.  We will consider alternate treatment options if the PSA is high despite a suppressed testosterone level.  Betsy Coder, MD  12/26/2021  3:18 PM

## 2021-12-27 ENCOUNTER — Other Ambulatory Visit (HOSPITAL_COMMUNITY): Payer: Self-pay

## 2021-12-28 ENCOUNTER — Encounter: Payer: Self-pay | Admitting: Interventional Cardiology

## 2021-12-28 ENCOUNTER — Encounter: Payer: Self-pay | Admitting: Oncology

## 2021-12-28 LAB — TESTOSTERONE: Testosterone: <3 ng/dL — ABNORMAL LOW (ref 264–916)

## 2021-12-30 ENCOUNTER — Other Ambulatory Visit (HOSPITAL_COMMUNITY): Payer: Self-pay

## 2021-12-30 ENCOUNTER — Other Ambulatory Visit: Payer: Self-pay | Admitting: Oncology

## 2021-12-30 ENCOUNTER — Other Ambulatory Visit: Payer: Self-pay | Admitting: *Deleted

## 2021-12-30 DIAGNOSIS — D693 Immune thrombocytopenic purpura: Secondary | ICD-10-CM

## 2021-12-30 DIAGNOSIS — N402 Nodular prostate without lower urinary tract symptoms: Secondary | ICD-10-CM

## 2021-12-30 MED ORDER — ABIRATERONE ACETATE 250 MG PO TABS
1000.0000 mg | ORAL_TABLET | Freq: Every day | ORAL | 0 refills | Status: DC
  Filled 2021-12-30: qty 120, 30d supply, fill #0

## 2021-12-31 ENCOUNTER — Other Ambulatory Visit (HOSPITAL_COMMUNITY): Payer: Self-pay

## 2021-12-31 DIAGNOSIS — R0981 Nasal congestion: Secondary | ICD-10-CM | POA: Diagnosis not present

## 2021-12-31 DIAGNOSIS — R509 Fever, unspecified: Secondary | ICD-10-CM | POA: Diagnosis not present

## 2021-12-31 DIAGNOSIS — R051 Acute cough: Secondary | ICD-10-CM | POA: Diagnosis not present

## 2021-12-31 DIAGNOSIS — C61 Malignant neoplasm of prostate: Secondary | ICD-10-CM | POA: Diagnosis not present

## 2021-12-31 DIAGNOSIS — Z1152 Encounter for screening for COVID-19: Secondary | ICD-10-CM | POA: Diagnosis not present

## 2021-12-31 DIAGNOSIS — R5383 Other fatigue: Secondary | ICD-10-CM | POA: Diagnosis not present

## 2022-01-02 ENCOUNTER — Other Ambulatory Visit (HOSPITAL_COMMUNITY): Payer: Self-pay

## 2022-01-06 ENCOUNTER — Inpatient Hospital Stay: Payer: Medicare Other | Attending: Nurse Practitioner

## 2022-01-06 ENCOUNTER — Inpatient Hospital Stay: Payer: Medicare Other

## 2022-01-06 ENCOUNTER — Telehealth: Payer: Self-pay | Admitting: *Deleted

## 2022-01-06 VITALS — BP 133/57 | HR 57 | Temp 98.3°F | Resp 18 | Ht 65.0 in | Wt 160.4 lb

## 2022-01-06 DIAGNOSIS — C7951 Secondary malignant neoplasm of bone: Secondary | ICD-10-CM | POA: Insufficient documentation

## 2022-01-06 DIAGNOSIS — Z5111 Encounter for antineoplastic chemotherapy: Secondary | ICD-10-CM | POA: Diagnosis not present

## 2022-01-06 DIAGNOSIS — C61 Malignant neoplasm of prostate: Secondary | ICD-10-CM

## 2022-01-06 DIAGNOSIS — D696 Thrombocytopenia, unspecified: Secondary | ICD-10-CM | POA: Diagnosis not present

## 2022-01-06 DIAGNOSIS — D693 Immune thrombocytopenic purpura: Secondary | ICD-10-CM

## 2022-01-06 LAB — CBC WITH DIFFERENTIAL (CANCER CENTER ONLY)
Abs Immature Granulocytes: 0.6 10*3/uL — ABNORMAL HIGH (ref 0.00–0.07)
Basophils Absolute: 0.2 10*3/uL — ABNORMAL HIGH (ref 0.0–0.1)
Basophils Relative: 2 %
Eosinophils Absolute: 0.2 10*3/uL (ref 0.0–0.5)
Eosinophils Relative: 2 %
HCT: 30.1 % — ABNORMAL LOW (ref 39.0–52.0)
Hemoglobin: 9.3 g/dL — ABNORMAL LOW (ref 13.0–17.0)
Lymphocytes Relative: 13 %
Lymphs Abs: 1.6 10*3/uL (ref 0.7–4.0)
MCH: 26.6 pg (ref 26.0–34.0)
MCHC: 30.9 g/dL (ref 30.0–36.0)
MCV: 86.2 fL (ref 80.0–100.0)
Metamyelocytes Relative: 5 %
Monocytes Absolute: 0.4 10*3/uL (ref 0.1–1.0)
Monocytes Relative: 3 %
Neutro Abs: 9 10*3/uL — ABNORMAL HIGH (ref 1.7–7.7)
Neutrophils Relative %: 75 %
Platelet Count: 57 10*3/uL — ABNORMAL LOW (ref 150–400)
RBC: 3.49 MIL/uL — ABNORMAL LOW (ref 4.22–5.81)
RDW: 19.3 % — ABNORMAL HIGH (ref 11.5–15.5)
WBC Count: 12 10*3/uL — ABNORMAL HIGH (ref 4.0–10.5)
nRBC: 0.5 % — ABNORMAL HIGH (ref 0.0–0.2)

## 2022-01-06 MED ORDER — ROMIPLOSTIM 250 MCG ~~LOC~~ SOLR
2.0300 ug/kg | Freq: Once | SUBCUTANEOUS | Status: AC
  Administered 2022-01-06: 150 ug via SUBCUTANEOUS
  Filled 2022-01-06: qty 0.3

## 2022-01-06 NOTE — Patient Instructions (Signed)
Romiplostim Injection What is this medication? ROMIPLOSTIM (roe mi PLOE stim) treats low levels of platelets in your body caused by immune thrombocytopenia (ITP). It is prescribed when other medications have not worked or cannot be tolerated. It may also be used to help people who have been exposed to high doses of radiation. It works by increasing the amount of platelets in your blood. This lowers the risk of bleeding. This medicine may be used for other purposes; ask your health care provider or pharmacist if you have questions. COMMON BRAND NAME(S): Nplate What should I tell my care team before I take this medication? They need to know if you have any of these conditions: Blood clots Myelodysplastic syndrome An unusual or allergic reaction to romiplostim, mannitol, other medications, foods, dyes, or preservatives Pregnant or trying to get pregnant Breast-feeding How should I use this medication? This medication is injected under the skin. It is given by a care team in a hospital or clinic setting. A special MedGuide will be given to you before each treatment. Be sure to read this information carefully each time. Talk to your care team about the use of this medication in children. While it may be prescribed for children as young as newborns for selected conditions, precautions do apply. Overdosage: If you think you have taken too much of this medicine contact a poison control center or emergency room at once. NOTE: This medicine is only for you. Do not share this medicine with others. What if I miss a dose? Keep appointments for follow-up doses. It is important not to miss your dose. Call your care team if you are unable to keep an appointment. What may interact with this medication? Interactions are not expected. This list may not describe all possible interactions. Give your health care provider a list of all the medicines, herbs, non-prescription drugs, or dietary supplements you use. Also  tell them if you smoke, drink alcohol, or use illegal drugs. Some items may interact with your medicine. What should I watch for while using this medication? Visit your care team for regular checks on your progress. You may need blood work done while you are taking this medication. Your condition will be monitored carefully while you are receiving this medication. It is important not to miss any appointments. What side effects may I notice from receiving this medication? Side effects that you should report to your care team as soon as possible: Allergic reactions--skin rash, itching, hives, swelling of the face, lips, tongue, or throat Blood clot--pain, swelling, or warmth in the leg, shortness of breath, chest pain Side effects that usually do not require medical attention (report to your care team if they continue or are bothersome): Dizziness Joint pain Muscle pain Pain in the hands or feet Stomach pain Trouble sleeping This list may not describe all possible side effects. Call your doctor for medical advice about side effects. You may report side effects to FDA at 1-800-FDA-1088. Where should I keep my medication? This medication is given in a hospital or clinic. It will not be stored at home. NOTE: This sheet is a summary. It may not cover all possible information. If you have questions about this medicine, talk to your doctor, pharmacist, or health care provider.  2023 Elsevier/Gold Standard (2021-05-28 00:00:00)  

## 2022-01-06 NOTE — Telephone Encounter (Signed)
Per Dr. Benay Spice: testosterone level going lower and his PSA is going up. Suggests Guardant 360 blood test to determine if he will qualify for a new parp inhibitor. Called Mrs. Luger to discuss and see if he would agree to this test at next visit. She will discuss with him and call if he declines testing.

## 2022-01-07 DIAGNOSIS — C61 Malignant neoplasm of prostate: Secondary | ICD-10-CM | POA: Diagnosis not present

## 2022-01-07 DIAGNOSIS — I10 Essential (primary) hypertension: Secondary | ICD-10-CM | POA: Diagnosis not present

## 2022-01-07 DIAGNOSIS — R051 Acute cough: Secondary | ICD-10-CM | POA: Diagnosis not present

## 2022-01-07 DIAGNOSIS — I480 Paroxysmal atrial fibrillation: Secondary | ICD-10-CM | POA: Diagnosis not present

## 2022-01-07 DIAGNOSIS — J189 Pneumonia, unspecified organism: Secondary | ICD-10-CM | POA: Diagnosis not present

## 2022-01-07 DIAGNOSIS — R9389 Abnormal findings on diagnostic imaging of other specified body structures: Secondary | ICD-10-CM | POA: Diagnosis not present

## 2022-01-08 DIAGNOSIS — H353221 Exudative age-related macular degeneration, left eye, with active choroidal neovascularization: Secondary | ICD-10-CM | POA: Diagnosis not present

## 2022-01-13 ENCOUNTER — Inpatient Hospital Stay: Payer: Medicare Other

## 2022-01-13 ENCOUNTER — Other Ambulatory Visit: Payer: Self-pay | Admitting: *Deleted

## 2022-01-13 ENCOUNTER — Encounter: Payer: Self-pay | Admitting: Nurse Practitioner

## 2022-01-13 ENCOUNTER — Inpatient Hospital Stay (HOSPITAL_BASED_OUTPATIENT_CLINIC_OR_DEPARTMENT_OTHER): Payer: Medicare Other | Admitting: Nurse Practitioner

## 2022-01-13 VITALS — BP 120/58 | HR 68 | Temp 98.2°F | Resp 18 | Ht 65.0 in | Wt 156.6 lb

## 2022-01-13 DIAGNOSIS — D693 Immune thrombocytopenic purpura: Secondary | ICD-10-CM

## 2022-01-13 DIAGNOSIS — C7951 Secondary malignant neoplasm of bone: Secondary | ICD-10-CM | POA: Diagnosis not present

## 2022-01-13 DIAGNOSIS — C61 Malignant neoplasm of prostate: Secondary | ICD-10-CM | POA: Diagnosis not present

## 2022-01-13 DIAGNOSIS — Z5111 Encounter for antineoplastic chemotherapy: Secondary | ICD-10-CM | POA: Diagnosis not present

## 2022-01-13 DIAGNOSIS — D696 Thrombocytopenia, unspecified: Secondary | ICD-10-CM | POA: Diagnosis not present

## 2022-01-13 LAB — CMP (CANCER CENTER ONLY)
ALT: 13 U/L (ref 0–44)
AST: 19 U/L (ref 15–41)
Albumin: 3.6 g/dL (ref 3.5–5.0)
Alkaline Phosphatase: 933 U/L — ABNORMAL HIGH (ref 38–126)
Anion gap: 8 (ref 5–15)
BUN: 23 mg/dL (ref 8–23)
CO2: 24 mmol/L (ref 22–32)
Calcium: 8.5 mg/dL — ABNORMAL LOW (ref 8.9–10.3)
Chloride: 108 mmol/L (ref 98–111)
Creatinine: 1.25 mg/dL — ABNORMAL HIGH (ref 0.61–1.24)
GFR, Estimated: 53 mL/min — ABNORMAL LOW (ref >60)
Glucose, Bld: 129 mg/dL — ABNORMAL HIGH (ref 70–99)
Potassium: 3.8 mmol/L (ref 3.5–5.1)
Sodium: 140 mmol/L (ref 135–145)
Total Bilirubin: 0.5 mg/dL (ref 0.3–1.2)
Total Protein: 5.8 g/dL — ABNORMAL LOW (ref 6.5–8.1)

## 2022-01-13 LAB — CBC WITH DIFFERENTIAL (CANCER CENTER ONLY)
Abs Immature Granulocytes: 0.7 10*3/uL — ABNORMAL HIGH (ref 0.00–0.07)
Band Neutrophils: 1 %
Basophils Absolute: 0.1 10*3/uL (ref 0.0–0.1)
Basophils Relative: 1 %
Eosinophils Absolute: 0.2 10*3/uL (ref 0.0–0.5)
Eosinophils Relative: 2 %
HCT: 30.2 % — ABNORMAL LOW (ref 39.0–52.0)
Hemoglobin: 9.4 g/dL — ABNORMAL LOW (ref 13.0–17.0)
Lymphocytes Relative: 15 %
Lymphs Abs: 1.9 10*3/uL (ref 0.7–4.0)
MCH: 26.6 pg (ref 26.0–34.0)
MCHC: 31.1 g/dL (ref 30.0–36.0)
MCV: 85.6 fL (ref 80.0–100.0)
Metamyelocytes Relative: 1 %
Monocytes Absolute: 1.5 10*3/uL — ABNORMAL HIGH (ref 0.1–1.0)
Monocytes Relative: 12 %
Myelocytes: 5 %
Neutro Abs: 7.9 10*3/uL — ABNORMAL HIGH (ref 1.7–7.7)
Neutrophils Relative %: 63 %
Platelet Count: 55 10*3/uL — ABNORMAL LOW (ref 150–400)
RBC: 3.53 MIL/uL — ABNORMAL LOW (ref 4.22–5.81)
RDW: 19.7 % — ABNORMAL HIGH (ref 11.5–15.5)
WBC Count: 12.4 10*3/uL — ABNORMAL HIGH (ref 4.0–10.5)
nRBC: 0.4 % — ABNORMAL HIGH (ref 0.0–0.2)

## 2022-01-13 MED ORDER — ROMIPLOSTIM 250 MCG ~~LOC~~ SOLR
2.0800 ug/kg | Freq: Once | SUBCUTANEOUS | Status: AC
  Administered 2022-01-13: 150 ug via SUBCUTANEOUS
  Filled 2022-01-13: qty 0.3

## 2022-01-13 MED ORDER — LEUPROLIDE ACETATE (3 MONTH) 22.5 MG ~~LOC~~ KIT
22.5000 mg | PACK | Freq: Once | SUBCUTANEOUS | Status: AC
  Administered 2022-01-13: 22.5 mg via SUBCUTANEOUS
  Filled 2022-01-13: qty 22.5

## 2022-01-13 NOTE — Progress Notes (Signed)
[facility] OFFICE PROGRESS NOTE   Diagnosis: Thrombocytopenia, prostate cancer  INTERVAL HISTORY:   [name] returns as scheduled.  He continues weekly Nplate.  He denies bleeding.  He has intermittent pain at the right posterior iliac region.  He takes tramadol as needed.  He reports a recent "cold".  He has had 2 courses of antibiotics.  Cough has markedly improved.  Objective:  Vital signs in last 24 hours:  Blood pressure (!) 120/58, pulse 68, temperature 98.2 F (36.8 C), temperature source Oral, resp. rate 18, height 5' 5" (1.651 m), weight 156 lb 9.6 oz (71 kg), SpO2 98 %.    HEENT: No thrush or ulcers. Resp: Lungs clear bilaterally. Cardio: Regular rate and rhythm. GI: No hepatomegaly. Vascular: Trace edema lower leg bilaterally left slightly greater than right.   Lab Results:  Lab Results  Component Value Date   WBC 12.4 (H) 01/13/2022   HGB 9.4 (L) 01/13/2022   HCT 30.2 (L) 01/13/2022   MCV 85.6 01/13/2022   PLT 55 (L) 01/13/2022   NEUTROABS 7.9 (H) 01/13/2022    Imaging:  No results found.  Medications: I have reviewed the patient's current medications.  Assessment/Plan: Thrombocytopenia Bone marrow biopsy 07/08/2016-no evidence of B-cell lymphoma, 40% cellular bone marrow with trilineage hematopoiesis, megakaryocytes present with normal morphology, normal cytogenetics, negative for BRAF mutation Flow cytometry 01/05/2009-no monoclonal B-cell or phenotypically abnormal T-cell population Prednisone starting 06/29/2019 Nplate 07/04/2019, 7/62/2633, Nplate 07/18/2019 Prednisone 20 mg daily beginning 07/25/2019 Platelets 11,000 on 08/15/2019, prednisone increased to 40 mg daily, Nplate resume 3/54/5625  Prednisone decreased to 30 mg daily 08/25/2019, Nplate continued Prednisone continued at 30 mg daily 09/13/2019, weekly Nplate continued Prednisone taper to 20 mg daily 09/23/2019, weekly Nplate continued Prednisone taper to 10 mg daily 09/30/2019,  weekly Nplate continued Prednisone taper to 5 mg daily 10/21/2019, weekly Nplate continued Prednisone taper to 5 mg every other day for 5 doses then stop 11/11/2019, weekly Nplate continued Nplate last given 6/38/9373 Promacta cost prohibitive Nplate beginning 06/03/8766, held 08/27/2020 and 09/03/2020 due to patient vacation Nplate resumed 03/17/7260 Nplate held 0/05/5595 secondary to an elevated platelet count Nplate resumed with dose reduction to 1 mcg/kg 10/09/2020 Nplate dose decreased to 0.5 mcg/kg 10/29/2020 Nplate dose increased to 1 mcg/kg 11/07/2020 Hairy cell leukemia 1982, status post splenectomy Coronary artery disease Aortic stenosis Liposarcoma at the right chest wall resected in 2013 Macular degeneration Hearing loss Basal cell carcinoma left cheek 05/24/2019 Left upper lobe nodule, faint FDG activity on PET at [facility] 05/31/2013 Status post TAVR procedure 09/06/2019 Admission with Pseudomonas urosepsis 09/16/2019 Acute left MCA distribution CVA 09/16/2019 Covid January 2022 Mild anemia, 1 of 3 stool Hemoccult cards positive for occult blood 10/14/2020, ferritin low 06/29/2020 Virtual colonoscopy 06/25/2021 -5 mm polypoid defect in the ascending colon 15.  Prostate cancer-sclerotic bone lesions/prostatic hypertrophy on CT 06/25/2021; Lupron every 3 months beginning 08/02/2021, bicalutamide 08/02/2021 for 2 weeks Abiraterone/prednisone 11/05/2021 Lupron every 3 months beginning 08/02/2021 12/16/2021 PSA higher (457) 12/26/2021 testosterone less than 3 16.  Pneumonia 09/30/2021    Disposition: [name] appears unchanged.  We reviewed the PSA result from 12/16/2021 which is higher and the testosterone result from 12/26/2021, undetectable.  They understand this may indicate the current treatment is not effective.  We will follow-up on the PSA result from today and contact him with further instructions.  For now he will continue abiraterone/prednisone and Lupron every 3 months.  Guardant360 testing obtained  today.  Mr. and Mrs. Dismuke understand  the purpose of this testing is to determine if he is a candidate to receive specific other treatments.  CBC from today reviewed.  Platelet count is stable.  Plan to continue weekly Nplate injections.  He will return for lab and Nplate in 1 week.  We will see him in follow-up in 2 weeks.  He will contact the office in the interim with any problems.  Patient seen with Dr. Benay Spice.    [name] ANP/GNP-BC   01/13/2022  1:13 PM This was a shared visit with [name].  [name] was interviewed and examined.  We discussed treatment options for the prostate cancer.  The testosterone returned appropriately low while on treatment with leuprolide and abiraterone.  It is unusual to not obtain a PSA response with the current regimen.  We submitted peripheral blood for Guardant 360 testing to see if he may be a candidate for a targeted therapy.  We will follow-up on the PSA from today.  He will receive Lupron today.  We discussed Zometa therapy.  We decided to hold on Zometa for now.  I was present for greater than 50% of today's visit.  I performed medical decision making.  Douglas Manson, MD

## 2022-01-13 NOTE — Patient Instructions (Signed)
Romiplostim Injection What is this medication? ROMIPLOSTIM (roe mi PLOE stim) treats low levels of platelets in your body caused by immune thrombocytopenia (ITP). It is prescribed when other medications have not worked or cannot be tolerated. It may also be used to help people who have been exposed to high doses of radiation. It works by increasing the amount of platelets in your blood. This lowers the risk of bleeding. This medicine may be used for other purposes; ask your health care provider or pharmacist if you have questions. COMMON BRAND NAME(S): Nplate What should I tell my care team before I take this medication? They need to know if you have any of these conditions: Blood clots Myelodysplastic syndrome An unusual or allergic reaction to romiplostim, mannitol, other medications, foods, dyes, or preservatives Pregnant or trying to get pregnant Breast-feeding How should I use this medication? This medication is injected under the skin. It is given by a care team in a hospital or clinic setting. A special MedGuide will be given to you before each treatment. Be sure to read this information carefully each time. Talk to your care team about the use of this medication in children. While it may be prescribed for children as young as newborns for selected conditions, precautions do apply. Overdosage: If you think you have taken too much of this medicine contact a poison control center or emergency room at once. NOTE: This medicine is only for you. Do not share this medicine with others. What if I miss a dose? Keep appointments for follow-up doses. It is important not to miss your dose. Call your care team if you are unable to keep an appointment. What may interact with this medication? Interactions are not expected. This list may not describe all possible interactions. Give your health care provider a list of all the medicines, herbs, non-prescription drugs, or dietary supplements you use. Also  tell them if you smoke, drink alcohol, or use illegal drugs. Some items may interact with your medicine. What should I watch for while using this medication? Visit your care team for regular checks on your progress. You may need blood work done while you are taking this medication. Your condition will be monitored carefully while you are receiving this medication. It is important not to miss any appointments. What side effects may I notice from receiving this medication? Side effects that you should report to your care team as soon as possible: Allergic reactions--skin rash, itching, hives, swelling of the face, lips, tongue, or throat Blood clot--pain, swelling, or warmth in the leg, shortness of breath, chest pain Side effects that usually do not require medical attention (report to your care team if they continue or are bothersome): Dizziness Joint pain Muscle pain Pain in the hands or feet Stomach pain Trouble sleeping This list may not describe all possible side effects. Call your doctor for medical advice about side effects. You may report side effects to FDA at 1-800-FDA-1088. Where should I keep my medication? This medication is given in a hospital or clinic. It will not be stored at home. NOTE: This sheet is a summary. It may not cover all possible information. If you have questions about this medicine, talk to your doctor, pharmacist, or health care provider.  2023 Elsevier/Gold Standard (2021-05-28 00:00:00) Leuprolide Emulsion for Injection What is this medication? LEUPROLIDE (loo PROE lide) reduces the symptoms of prostate cancer. It works by decreasing levels of the hormone testosterone in the body. This prevents prostate cancer cells from spreading or  growing. This medicine may be used for other purposes; ask your health care provider or pharmacist if you have questions. COMMON BRAND NAME(S): CAMCEVI What should I tell my care team before I take this medication? They need to  know if you have any of these conditions: Diabetes Heart disease Heart failure High or low levels of electrolytes, such as magnesium, potassium, or sodium in your blood Irregular heartbeat or rhythm Seizures An unusual or allergic reaction to leuprolide, other medications, foods, dyes, or preservatives Pregnant or trying to get pregnant Breastfeeding How should I use this medication? This medication is injected under the skin. It is given by your care team in a hospital or clinic setting. Talk to your care team about the use of this medication in children. Special care may be needed. Overdosage: If you think you have taken too much of this medicine contact a poison control center or emergency room at once. NOTE: This medicine is only for you. Do not share this medicine with others. What if I miss a dose? Keep appointments for follow-up doses. It is important not to miss your dose. Call your care team if you are unable to keep an appointment. What may interact with this medication? Do not take this medication with any of the following: Cisapride Dronedarone Ketoconazole Levoketoconazole Pimozide Thioridazine This medication may also interact with the following: Other medications that cause heart rhythm changes This list may not describe all possible interactions. Give your health care provider a list of all the medicines, herbs, non-prescription drugs, or dietary supplements you use. Also tell them if you smoke, drink alcohol, or use illegal drugs. Some items may interact with your medicine. What should I watch for while using this medication? Visit your care team for regular checks on your progress. Tell your care team if your symptoms do not start to get better or if they get worse. This medication may increase blood sugar. The risk may be higher in patients who already have diabetes. Ask your care team what you can do to lower the risk of diabetes while taking this medication. This  medication may cause infertility. Talk to your care team if you are concerned about your fertility. Heart attacks and strokes have been reported with the use of this medication. Get emergency help if you develop signs or symptoms of a heart attack or stroke. Talk to your care team about the risks and benefits of this medication. What side effects may I notice from receiving this medication? Side effects that you should report to your care team as soon as possible: Allergic reactions--skin rash, itching, hives, swelling of the face, lips, tongue, or throat Heart attack--pain or tightness in the chest, shoulders, arms, or jaw, nausea, shortness of breath, cold or clammy skin, feeling faint or lightheaded Heart rhythm changes--fast or irregular heartbeat, dizziness, feeling faint or lightheaded, chest pain, trouble breathing High blood sugar (hyperglycemia)--increased thirst or amount of urine, unusual weakness or fatigue, blurry vision Mood swings, irritability, hostility Seizures Stroke--sudden numbness or weakness of the face, arm, or leg, trouble speaking, confusion, trouble walking, loss of balance or coordination, dizziness, severe headache, change in vision Thoughts of suicide or self-harm, worsening mood, feelings of depression Side effects that usually do not require medical attention (report to your care team if they continue or are bothersome): Bone pain Change in sex drive or performance General discomfort and fatigue Hot flashes Muscle pain Pain, redness, or irritation at injection site Swelling of the ankles, hands, or feet This  list may not describe all possible side effects. Call your doctor for medical advice about side effects. You may report side effects to FDA at 1-800-FDA-1088. Where should I keep my medication? This medication is given in a hospital or clinic. It will not be stored at home. NOTE: This sheet is a summary. It may not cover all possible information. If you have  questions about this medicine, talk to your doctor, pharmacist, or health care provider.  2023 Elsevier/Gold Standard (2021-07-19 00:00:00)

## 2022-01-14 LAB — PROSTATE-SPECIFIC AG, SERUM (LABCORP): Prostate Specific Ag, Serum: 531 ng/mL — ABNORMAL HIGH (ref 0.0–4.0)

## 2022-01-15 ENCOUNTER — Other Ambulatory Visit (HOSPITAL_COMMUNITY): Payer: Self-pay

## 2022-01-15 ENCOUNTER — Telehealth: Payer: Self-pay

## 2022-01-15 NOTE — Telephone Encounter (Signed)
Patient gave verbal understanding and had no further questions or concerns  

## 2022-01-15 NOTE — Telephone Encounter (Signed)
-----   Message from Ladell Pier, MD sent at 01/15/2022  8:09 AM EST ----- Please call patient, wife, PSA is higher, stop abiraterone, f/u as scheduled, we will wait on Guardant 360 testing and decide on other treatment options for the prostate cancer

## 2022-01-16 ENCOUNTER — Telehealth: Payer: Self-pay

## 2022-01-16 NOTE — Telephone Encounter (Signed)
Per Dr. Hillery Aldo Mr. Durnin needs to stop his prednisone. Mrs. Walbert is aware.

## 2022-01-20 ENCOUNTER — Inpatient Hospital Stay: Payer: Medicare Other

## 2022-01-20 ENCOUNTER — Encounter: Payer: Self-pay | Admitting: Oncology

## 2022-01-20 VITALS — BP 146/59 | HR 66 | Temp 98.4°F | Resp 20 | Ht 65.0 in | Wt 157.1 lb

## 2022-01-20 DIAGNOSIS — C7951 Secondary malignant neoplasm of bone: Secondary | ICD-10-CM | POA: Diagnosis not present

## 2022-01-20 DIAGNOSIS — D696 Thrombocytopenia, unspecified: Secondary | ICD-10-CM | POA: Diagnosis not present

## 2022-01-20 DIAGNOSIS — D693 Immune thrombocytopenic purpura: Secondary | ICD-10-CM

## 2022-01-20 DIAGNOSIS — C61 Malignant neoplasm of prostate: Secondary | ICD-10-CM | POA: Diagnosis not present

## 2022-01-20 DIAGNOSIS — Z5111 Encounter for antineoplastic chemotherapy: Secondary | ICD-10-CM | POA: Diagnosis not present

## 2022-01-20 LAB — CBC WITH DIFFERENTIAL (CANCER CENTER ONLY)
Abs Immature Granulocytes: 0.47 10*3/uL — ABNORMAL HIGH (ref 0.00–0.07)
Basophils Absolute: 0.1 10*3/uL (ref 0.0–0.1)
Basophils Relative: 1 %
Eosinophils Absolute: 0.1 10*3/uL (ref 0.0–0.5)
Eosinophils Relative: 1 %
HCT: 29.7 % — ABNORMAL LOW (ref 39.0–52.0)
Hemoglobin: 9.1 g/dL — ABNORMAL LOW (ref 13.0–17.0)
Immature Granulocytes: 4 %
Lymphocytes Relative: 9 %
Lymphs Abs: 1 10*3/uL (ref 0.7–4.0)
MCH: 26.2 pg (ref 26.0–34.0)
MCHC: 30.6 g/dL (ref 30.0–36.0)
MCV: 85.6 fL (ref 80.0–100.0)
Monocytes Absolute: 2.7 10*3/uL — ABNORMAL HIGH (ref 0.1–1.0)
Monocytes Relative: 24 %
Neutro Abs: 6.7 10*3/uL (ref 1.7–7.7)
Neutrophils Relative %: 61 %
Platelet Count: 78 10*3/uL — ABNORMAL LOW (ref 150–400)
RBC: 3.47 MIL/uL — ABNORMAL LOW (ref 4.22–5.81)
RDW: 19.7 % — ABNORMAL HIGH (ref 11.5–15.5)
WBC Count: 11.1 10*3/uL — ABNORMAL HIGH (ref 4.0–10.5)
nRBC: 0.3 % — ABNORMAL HIGH (ref 0.0–0.2)

## 2022-01-20 MED ORDER — ROMIPLOSTIM 250 MCG ~~LOC~~ SOLR
2.0800 ug/kg | Freq: Once | SUBCUTANEOUS | Status: AC
  Administered 2022-01-20: 150 ug via SUBCUTANEOUS
  Filled 2022-01-20: qty 0.3

## 2022-01-20 NOTE — Progress Notes (Signed)
Patient complained of generalized aching of 9/10, he stated "I can hardly move around". Per Dr. Benay Spice: Pt. should resume Prednisone '5mg'$  daily and see if pain improves. Pt. and spouse informed and they verbalized agreement.

## 2022-01-20 NOTE — Patient Instructions (Signed)
Romiplostim Injection What is this medication? ROMIPLOSTIM (roe mi PLOE stim) treats low levels of platelets in your body caused by immune thrombocytopenia (ITP). It is prescribed when other medications have not worked or cannot be tolerated. It may also be used to help people who have been exposed to high doses of radiation. It works by increasing the amount of platelets in your blood. This lowers the risk of bleeding. This medicine may be used for other purposes; ask your health care provider or pharmacist if you have questions. COMMON BRAND NAME(S): Nplate What should I tell my care team before I take this medication? They need to know if you have any of these conditions: Blood clots Myelodysplastic syndrome An unusual or allergic reaction to romiplostim, mannitol, other medications, foods, dyes, or preservatives Pregnant or trying to get pregnant Breast-feeding How should I use this medication? This medication is injected under the skin. It is given by a care team in a hospital or clinic setting. A special MedGuide will be given to you before each treatment. Be sure to read this information carefully each time. Talk to your care team about the use of this medication in children. While it may be prescribed for children as young as newborns for selected conditions, precautions do apply. Overdosage: If you think you have taken too much of this medicine contact a poison control center or emergency room at once. NOTE: This medicine is only for you. Do not share this medicine with others. What if I miss a dose? Keep appointments for follow-up doses. It is important not to miss your dose. Call your care team if you are unable to keep an appointment. What may interact with this medication? Interactions are not expected. This list may not describe all possible interactions. Give your health care provider a list of all the medicines, herbs, non-prescription drugs, or dietary supplements you use. Also  tell them if you smoke, drink alcohol, or use illegal drugs. Some items may interact with your medicine. What should I watch for while using this medication? Visit your care team for regular checks on your progress. You may need blood work done while you are taking this medication. Your condition will be monitored carefully while you are receiving this medication. It is important not to miss any appointments. What side effects may I notice from receiving this medication? Side effects that you should report to your care team as soon as possible: Allergic reactions--skin rash, itching, hives, swelling of the face, lips, tongue, or throat Blood clot--pain, swelling, or warmth in the leg, shortness of breath, chest pain Side effects that usually do not require medical attention (report to your care team if they continue or are bothersome): Dizziness Joint pain Muscle pain Pain in the hands or feet Stomach pain Trouble sleeping This list may not describe all possible side effects. Call your doctor for medical advice about side effects. You may report side effects to FDA at 1-800-FDA-1088. Where should I keep my medication? This medication is given in a hospital or clinic. It will not be stored at home. NOTE: This sheet is a summary. It may not cover all possible information. If you have questions about this medicine, talk to your doctor, pharmacist, or health care provider.  2023 Elsevier/Gold Standard (2021-05-28 00:00:00)  

## 2022-01-21 ENCOUNTER — Ambulatory Visit: Payer: Self-pay

## 2022-01-21 ENCOUNTER — Other Ambulatory Visit (HOSPITAL_COMMUNITY): Payer: Self-pay

## 2022-01-21 NOTE — Patient Outreach (Signed)
  Care Coordination   01/21/2022 Name: DOYT CASTELLANA MRN: [id number] DOB: [birth year]   Care Coordination Outreach Attempts:  An unsuccessful telephone outreach was attempted for a scheduled appointment today.  Follow Up Plan:  Additional outreach attempts will be made to offer the patient care coordination information and services.   Encounter Outcome:  No Answer  Care Coordination Interventions Activated:  No   Care Coordination Interventions:  No, not indicated    Barb Merino, RN, BSN, CCM Care Management Coordinator [facility] Care Management Direct Phone: [phone]

## 2022-01-26 ENCOUNTER — Other Ambulatory Visit: Payer: Self-pay | Admitting: Physician Assistant

## 2022-01-26 DIAGNOSIS — I10 Essential (primary) hypertension: Secondary | ICD-10-CM

## 2022-01-27 ENCOUNTER — Inpatient Hospital Stay: Payer: Medicare Other

## 2022-01-27 ENCOUNTER — Inpatient Hospital Stay (HOSPITAL_BASED_OUTPATIENT_CLINIC_OR_DEPARTMENT_OTHER): Payer: Medicare Other | Admitting: Oncology

## 2022-01-27 VITALS — BP 144/68 | HR 71 | Temp 98.1°F | Resp 20 | Ht 65.0 in | Wt 155.0 lb

## 2022-01-27 DIAGNOSIS — R051 Acute cough: Secondary | ICD-10-CM | POA: Diagnosis not present

## 2022-01-27 DIAGNOSIS — C61 Malignant neoplasm of prostate: Secondary | ICD-10-CM

## 2022-01-27 DIAGNOSIS — J069 Acute upper respiratory infection, unspecified: Secondary | ICD-10-CM | POA: Diagnosis not present

## 2022-01-27 DIAGNOSIS — I480 Paroxysmal atrial fibrillation: Secondary | ICD-10-CM | POA: Diagnosis not present

## 2022-01-27 DIAGNOSIS — R0981 Nasal congestion: Secondary | ICD-10-CM | POA: Diagnosis not present

## 2022-01-27 DIAGNOSIS — Z1152 Encounter for screening for COVID-19: Secondary | ICD-10-CM | POA: Diagnosis not present

## 2022-01-27 DIAGNOSIS — D693 Immune thrombocytopenic purpura: Secondary | ICD-10-CM

## 2022-01-27 DIAGNOSIS — Z5111 Encounter for antineoplastic chemotherapy: Secondary | ICD-10-CM | POA: Diagnosis not present

## 2022-01-27 DIAGNOSIS — D696 Thrombocytopenia, unspecified: Secondary | ICD-10-CM | POA: Diagnosis not present

## 2022-01-27 DIAGNOSIS — R5383 Other fatigue: Secondary | ICD-10-CM | POA: Diagnosis not present

## 2022-01-27 DIAGNOSIS — R9389 Abnormal findings on diagnostic imaging of other specified body structures: Secondary | ICD-10-CM | POA: Diagnosis not present

## 2022-01-27 DIAGNOSIS — C7951 Secondary malignant neoplasm of bone: Secondary | ICD-10-CM | POA: Diagnosis not present

## 2022-01-27 DIAGNOSIS — I10 Essential (primary) hypertension: Secondary | ICD-10-CM | POA: Diagnosis not present

## 2022-01-27 LAB — CBC WITH DIFFERENTIAL (CANCER CENTER ONLY)
Abs Immature Granulocytes: 1 10*3/uL — ABNORMAL HIGH (ref 0.00–0.07)
Basophils Absolute: 0.1 10*3/uL (ref 0.0–0.1)
Basophils Relative: 1 %
Eosinophils Absolute: 0.1 10*3/uL (ref 0.0–0.5)
Eosinophils Relative: 1 %
HCT: 30.4 % — ABNORMAL LOW (ref 39.0–52.0)
Hemoglobin: 9.1 g/dL — ABNORMAL LOW (ref 13.0–17.0)
Lymphocytes Relative: 10 %
Lymphs Abs: 1.3 10*3/uL (ref 0.7–4.0)
MCH: 25.9 pg — ABNORMAL LOW (ref 26.0–34.0)
MCHC: 29.9 g/dL — ABNORMAL LOW (ref 30.0–36.0)
MCV: 86.6 fL (ref 80.0–100.0)
Metamyelocytes Relative: 6 %
Monocytes Absolute: 1.6 10*3/uL — ABNORMAL HIGH (ref 0.1–1.0)
Monocytes Relative: 13 %
Myelocytes: 2 %
Neutro Abs: 8.4 10*3/uL — ABNORMAL HIGH (ref 1.7–7.7)
Neutrophils Relative %: 67 %
Platelet Count: 172 10*3/uL (ref 150–400)
RBC: 3.51 MIL/uL — ABNORMAL LOW (ref 4.22–5.81)
RDW: 20 % — ABNORMAL HIGH (ref 11.5–15.5)
WBC Count: 12.6 10*3/uL — ABNORMAL HIGH (ref 4.0–10.5)
nRBC: 0.5 % — ABNORMAL HIGH (ref 0.0–0.2)

## 2022-01-27 MED ORDER — ROMIPLOSTIM 250 MCG ~~LOC~~ SOLR
150.0000 ug | Freq: Once | SUBCUTANEOUS | Status: AC
  Administered 2022-01-27: 150 ug via SUBCUTANEOUS
  Filled 2022-01-27: qty 0.3

## 2022-01-27 NOTE — Progress Notes (Addendum)
[place] OFFICE PROGRESS NOTE   Diagnosis: Anemia, thrombocytopenia, prostate cancer  INTERVAL HISTORY:   Mr. Leicht returns as scheduled.  He discontinued abiraterone on 01/15/2022 due to a rising PSA.  He also discontinued prednisone.  He developed increased pain when the prednisone was discontinued.  He resumed prednisone 01/20/2022.  He has noted an improvement in pain and his overall performance status since resuming prednisone. He currently has "sinus drainage "and upper airway congestion.  He was placed on doxycycline earlier today by his primary provider.  Tests for COVID-19 and influenza were negative.  A chest x-ray revealed a persistent mass in the left mid thorax, osteoblastic bone lesions, and diffuse interstitial prominence.  He reports mild lower back discomfort at present.  He is not taking pain medication.  Objective:  Vital signs in last 24 hours:  Blood pressure (!) 144/68, pulse 71, temperature 98.1 F (36.7 C), temperature source Oral, resp. rate 20, height 5' 5" (1.651 m), weight 155 lb (70.3 kg), SpO2 98 %.    Resp: Lungs clear bilaterally in the anterior and posterior lung fields, no respiratory distress Cardio: Regular rate and rhythm GI: Nontender, no hepatomegaly Vascular: Trace edema to left greater than right ankle     Lab Results:  Lab Results  Component Value Date   WBC 12.6 (H) 01/27/2022   HGB 9.1 (L) 01/27/2022   HCT 30.4 (L) 01/27/2022   MCV 86.6 01/27/2022   PLT 172 01/27/2022   NEUTROABS 8.4 (H) 01/27/2022    CMP  Lab Results  Component Value Date   NA 140 01/13/2022   K 3.8 01/13/2022   CL 108 01/13/2022   CO2 24 01/13/2022   GLUCOSE 129 (H) 01/13/2022   BUN 23 01/13/2022   CREATININE 1.25 (H) 01/13/2022   CALCIUM 8.5 (L) 01/13/2022   PROT 5.8 (L) 01/13/2022   ALBUMIN 3.6 01/13/2022   AST 19 01/13/2022   ALT 13 01/13/2022   ALKPHOS 933 (H) 01/13/2022   BILITOT 0.5 01/13/2022   GFRNONAA 53 (L) 01/13/2022    GFRAA 59 (L) 10/31/2019    Medications: I have reviewed the patient's current medications.   Assessment/Plan: Thrombocytopenia Bone marrow biopsy 07/08/2016-no evidence of B-cell lymphoma, 40% cellular bone marrow with trilineage hematopoiesis, megakaryocytes present with normal morphology, normal cytogenetics, negative for BRAF mutation Flow cytometry 01/05/2009-no monoclonal B-cell or phenotypically abnormal T-cell population Prednisone starting 06/29/2019 Nplate 07/04/2019, 1/61/0960, Nplate 07/18/2019 Prednisone 20 mg daily beginning 07/25/2019 Platelets 11,000 on 08/15/2019, prednisone increased to 40 mg daily, Nplate resume 4/54/0981  Prednisone decreased to 30 mg daily 08/25/2019, Nplate continued Prednisone continued at 30 mg daily 09/13/2019, weekly Nplate continued Prednisone taper to 20 mg daily 09/23/2019, weekly Nplate continued Prednisone taper to 10 mg daily 09/30/2019, weekly Nplate continued Prednisone taper to 5 mg daily 10/21/2019, weekly Nplate continued Prednisone taper to 5 mg every other day for 5 doses then stop 11/11/2019, weekly Nplate continued Nplate last given 1/91/4782 Promacta cost prohibitive Nplate beginning 11/06/6211, held 08/27/2020 and 09/03/2020 due to patient vacation Nplate resumed 0/86/5784 Nplate held 08/09/6293 secondary to an elevated platelet count Nplate resumed with dose reduction to 1 mcg/kg 10/09/2020 Nplate dose decreased to 0.5 mcg/kg 10/29/2020 Nplate dose increased to 1 mcg/kg 11/07/2020 Hairy cell leukemia 1982, status post splenectomy Coronary artery disease Aortic stenosis Liposarcoma at the right chest wall resected in 2013 Macular degeneration Hearing loss Basal cell carcinoma left cheek 05/24/2019 Left upper lobe nodule, faint FDG activity on PET at [facility] 05/31/2013 Left lung  mass has been present since 2007, negative bronchoscopy December 2007 Status post TAVR procedure 09/06/2019 Admission with Pseudomonas urosepsis 09/16/2019 Acute left MCA  distribution CVA 09/16/2019 Covid January 2022 Mild anemia, 1 of 3 stool Hemoccult cards positive for occult blood 10/14/2020, ferritin low 06/29/2020 Virtual colonoscopy 06/25/2021 -5 mm polypoid defect in the ascending colon 15.  Prostate cancer-sclerotic bone lesions/prostatic hypertrophy on CT 06/25/2021; Lupron every 3 months beginning 08/02/2021, bicalutamide 08/02/2021 for 2 weeks Abiraterone/prednisone 11/05/2021 Lupron every 3 months beginning 08/02/2021 12/16/2021 PSA higher (457) 12/26/2021 testosterone less than 3 Guardant360 01/13/2022-tumor mutation burden 8.61, MSI high not detected, NRAS G13D,PALB2 VUS 16.  Pneumonia 09/30/2021     Disposition: Mr Sevey has chronic thrombocytopenia, potentially related to ITP versus metastatic prostate cancer involving the bone marrow.  The platelet count is adequately controlled on Nplate.  He has metastatic prostate cancer.  The PSA initially improved with the start of Lupron/bicalutamide, but has gradually increased over the past several months.  Abiraterone was discontinued a few weeks ago.  He felt worse when prednisone was stopped.  He will continue prednisone.  I discussed treatment options with Mr. Battaglini.  He does not appear to be a candidate for docetaxel.  He may be a candidate for Pluvicto.  We submitted peripheral blood for Guardant360 testing.  There is no actionable mutation.  He does not appear to be a candidate for a PARP inhibitor or immunotherapy.  We may consider another trial of bicalutamide.  He will return for a lab visit and Nplate in 1 week.  We will check the PSA when he is here next week.  He will complete the course of doxycycline.  The left lung lesion has been present since 2007.  This lesion may represent a benign finding, granuloma, or an indolent neoplasm.  I think it is very unlikely the lung lesion is related to prostate cancer and the bone metastases.    Betsy Coder, MD  01/27/2022  4:52 PM

## 2022-01-29 ENCOUNTER — Other Ambulatory Visit: Payer: Self-pay | Admitting: *Deleted

## 2022-01-29 DIAGNOSIS — D693 Immune thrombocytopenic purpura: Secondary | ICD-10-CM

## 2022-02-03 ENCOUNTER — Other Ambulatory Visit: Payer: Self-pay | Admitting: Oncology

## 2022-02-03 ENCOUNTER — Inpatient Hospital Stay: Payer: Medicare Other | Attending: Nurse Practitioner

## 2022-02-03 ENCOUNTER — Other Ambulatory Visit (HOSPITAL_COMMUNITY): Payer: Self-pay

## 2022-02-03 ENCOUNTER — Inpatient Hospital Stay: Payer: Medicare Other

## 2022-02-03 VITALS — BP 149/68 | HR 57 | Temp 97.8°F | Resp 20 | Ht 65.0 in | Wt 155.2 lb

## 2022-02-03 DIAGNOSIS — D693 Immune thrombocytopenic purpura: Secondary | ICD-10-CM

## 2022-02-03 DIAGNOSIS — C7951 Secondary malignant neoplasm of bone: Secondary | ICD-10-CM | POA: Diagnosis not present

## 2022-02-03 DIAGNOSIS — C61 Malignant neoplasm of prostate: Secondary | ICD-10-CM

## 2022-02-03 DIAGNOSIS — D696 Thrombocytopenia, unspecified: Secondary | ICD-10-CM | POA: Insufficient documentation

## 2022-02-03 LAB — CBC WITH DIFFERENTIAL (CANCER CENTER ONLY)
Abs Immature Granulocytes: 0.7 10*3/uL — ABNORMAL HIGH (ref 0.00–0.07)
Band Neutrophils: 1 %
Basophils Absolute: 0.1 10*3/uL (ref 0.0–0.1)
Basophils Relative: 1 %
Eosinophils Absolute: 0.1 10*3/uL (ref 0.0–0.5)
Eosinophils Relative: 1 %
HCT: 29.2 % — ABNORMAL LOW (ref 39.0–52.0)
Hemoglobin: 8.9 g/dL — ABNORMAL LOW (ref 13.0–17.0)
Lymphocytes Relative: 17 %
Lymphs Abs: 1.7 10*3/uL (ref 0.7–4.0)
MCH: 26.2 pg (ref 26.0–34.0)
MCHC: 30.5 g/dL (ref 30.0–36.0)
MCV: 85.9 fL (ref 80.0–100.0)
Metamyelocytes Relative: 1 %
Monocytes Absolute: 1.3 10*3/uL — ABNORMAL HIGH (ref 0.1–1.0)
Monocytes Relative: 13 %
Myelocytes: 5 %
Neutro Abs: 6.2 10*3/uL (ref 1.7–7.7)
Neutrophils Relative %: 60 %
Platelet Count: 169 10*3/uL (ref 150–400)
Promyelocytes Relative: 1 %
RBC: 3.4 MIL/uL — ABNORMAL LOW (ref 4.22–5.81)
RDW: 20.5 % — ABNORMAL HIGH (ref 11.5–15.5)
Smear Review: DECREASED
WBC Count: 10.2 10*3/uL (ref 4.0–10.5)
nRBC: 0.6 % — ABNORMAL HIGH (ref 0.0–0.2)

## 2022-02-03 MED ORDER — PREDNISONE 5 MG PO TABS
5.0000 mg | ORAL_TABLET | Freq: Every day | ORAL | 2 refills | Status: AC
  Filled 2022-02-03: qty 30, 30d supply, fill #0
  Filled 2022-03-04: qty 30, 30d supply, fill #1
  Filled 2022-04-03: qty 30, 30d supply, fill #2

## 2022-02-03 MED ORDER — ROMIPLOSTIM 250 MCG ~~LOC~~ SOLR
2.1000 ug/kg | Freq: Once | SUBCUTANEOUS | Status: AC
  Administered 2022-02-03: 150 ug via SUBCUTANEOUS
  Filled 2022-02-03: qty 0.3

## 2022-02-03 NOTE — Patient Instructions (Signed)
Romiplostim Injection What is this medication? ROMIPLOSTIM (roe mi PLOE stim) treats low levels of platelets in your body caused by immune thrombocytopenia (ITP). It is prescribed when other medications have not worked or cannot be tolerated. It may also be used to help people who have been exposed to high doses of radiation. It works by increasing the amount of platelets in your blood. This lowers the risk of bleeding. This medicine may be used for other purposes; ask your health care provider or pharmacist if you have questions. COMMON BRAND NAME(S): Nplate What should I tell my care team before I take this medication? They need to know if you have any of these conditions: Blood clots Myelodysplastic syndrome An unusual or allergic reaction to romiplostim, mannitol, other medications, foods, dyes, or preservatives Pregnant or trying to get pregnant Breast-feeding How should I use this medication? This medication is injected under the skin. It is given by a care team in a hospital or clinic setting. A special MedGuide will be given to you before each treatment. Be sure to read this information carefully each time. Talk to your care team about the use of this medication in children. While it may be prescribed for children as young as newborns for selected conditions, precautions do apply. Overdosage: If you think you have taken too much of this medicine contact a poison control center or emergency room at once. NOTE: This medicine is only for you. Do not share this medicine with others. What if I miss a dose? Keep appointments for follow-up doses. It is important not to miss your dose. Call your care team if you are unable to keep an appointment. What may interact with this medication? Interactions are not expected. This list may not describe all possible interactions. Give your health care provider a list of all the medicines, herbs, non-prescription drugs, or dietary supplements you use. Also  tell them if you smoke, drink alcohol, or use illegal drugs. Some items may interact with your medicine. What should I watch for while using this medication? Visit your care team for regular checks on your progress. You may need blood work done while you are taking this medication. Your condition will be monitored carefully while you are receiving this medication. It is important not to miss any appointments. What side effects may I notice from receiving this medication? Side effects that you should report to your care team as soon as possible: Allergic reactions--skin rash, itching, hives, swelling of the face, lips, tongue, or throat Blood clot--pain, swelling, or warmth in the leg, shortness of breath, chest pain Side effects that usually do not require medical attention (report to your care team if they continue or are bothersome): Dizziness Joint pain Muscle pain Pain in the hands or feet Stomach pain Trouble sleeping This list may not describe all possible side effects. Call your doctor for medical advice about side effects. You may report side effects to FDA at 1-800-FDA-1088. Where should I keep my medication? This medication is given in a hospital or clinic. It will not be stored at home. NOTE: This sheet is a summary. It may not cover all possible information. If you have questions about this medicine, talk to your doctor, pharmacist, or health care provider.  2023 Elsevier/Gold Standard (2021-05-28 00:00:00)  

## 2022-02-04 ENCOUNTER — Other Ambulatory Visit (HOSPITAL_COMMUNITY): Payer: Self-pay

## 2022-02-04 LAB — PROSTATE-SPECIFIC AG, SERUM (LABCORP): Prostate Specific Ag, Serum: 600 ng/mL — ABNORMAL HIGH (ref 0.0–4.0)

## 2022-02-05 DIAGNOSIS — D0430 Carcinoma in situ of skin of unspecified part of face: Secondary | ICD-10-CM | POA: Diagnosis not present

## 2022-02-05 DIAGNOSIS — C44310 Basal cell carcinoma of skin of unspecified parts of face: Secondary | ICD-10-CM | POA: Diagnosis not present

## 2022-02-10 ENCOUNTER — Inpatient Hospital Stay (HOSPITAL_BASED_OUTPATIENT_CLINIC_OR_DEPARTMENT_OTHER): Payer: Medicare Other | Admitting: Nurse Practitioner

## 2022-02-10 ENCOUNTER — Other Ambulatory Visit (HOSPITAL_COMMUNITY): Payer: Self-pay

## 2022-02-10 ENCOUNTER — Inpatient Hospital Stay: Payer: Medicare Other

## 2022-02-10 ENCOUNTER — Encounter: Payer: Self-pay | Admitting: Nurse Practitioner

## 2022-02-10 VITALS — BP 145/66 | HR 73 | Temp 98.2°F | Resp 20 | Ht 65.0 in | Wt 153.0 lb

## 2022-02-10 DIAGNOSIS — D696 Thrombocytopenia, unspecified: Secondary | ICD-10-CM | POA: Diagnosis not present

## 2022-02-10 DIAGNOSIS — C7951 Secondary malignant neoplasm of bone: Secondary | ICD-10-CM | POA: Diagnosis not present

## 2022-02-10 DIAGNOSIS — C61 Malignant neoplasm of prostate: Secondary | ICD-10-CM

## 2022-02-10 DIAGNOSIS — D693 Immune thrombocytopenic purpura: Secondary | ICD-10-CM

## 2022-02-10 LAB — CBC WITH DIFFERENTIAL (CANCER CENTER ONLY)
Abs Immature Granulocytes: 0.45 10*3/uL — ABNORMAL HIGH (ref 0.00–0.07)
Basophils Absolute: 0.1 10*3/uL (ref 0.0–0.1)
Basophils Relative: 1 %
Eosinophils Absolute: 0.2 10*3/uL (ref 0.0–0.5)
Eosinophils Relative: 2 %
HCT: 28.7 % — ABNORMAL LOW (ref 39.0–52.0)
Hemoglobin: 9 g/dL — ABNORMAL LOW (ref 13.0–17.0)
Immature Granulocytes: 5 %
Lymphocytes Relative: 14 %
Lymphs Abs: 1.3 10*3/uL (ref 0.7–4.0)
MCH: 26.7 pg (ref 26.0–34.0)
MCHC: 31.4 g/dL (ref 30.0–36.0)
MCV: 85.2 fL (ref 80.0–100.0)
Monocytes Absolute: 2 10*3/uL — ABNORMAL HIGH (ref 0.1–1.0)
Monocytes Relative: 22 %
Neutro Abs: 4.9 10*3/uL (ref 1.7–7.7)
Neutrophils Relative %: 56 %
Platelet Count: 88 10*3/uL — ABNORMAL LOW (ref 150–400)
RBC: 3.37 MIL/uL — ABNORMAL LOW (ref 4.22–5.81)
RDW: 20.5 % — ABNORMAL HIGH (ref 11.5–15.5)
WBC Count: 8.8 10*3/uL (ref 4.0–10.5)
nRBC: 0.3 % — ABNORMAL HIGH (ref 0.0–0.2)

## 2022-02-10 MED ORDER — BICALUTAMIDE 50 MG PO TABS
50.0000 mg | ORAL_TABLET | Freq: Every day | ORAL | 1 refills | Status: DC
  Filled 2022-02-10: qty 9, 9d supply, fill #0
  Filled 2022-02-11: qty 21, 21d supply, fill #0
  Filled 2022-03-11: qty 30, 30d supply, fill #1

## 2022-02-10 MED ORDER — ROMIPLOSTIM 250 MCG ~~LOC~~ SOLR
2.0000 ug/kg | Freq: Once | SUBCUTANEOUS | Status: AC
  Administered 2022-02-10: 140 ug via SUBCUTANEOUS
  Filled 2022-02-10: qty 0.28

## 2022-02-10 NOTE — Patient Instructions (Signed)
Romiplostim Injection What is this medication? ROMIPLOSTIM (roe mi PLOE stim) treats low levels of platelets in your body caused by immune thrombocytopenia (ITP). It is prescribed when other medications have not worked or cannot be tolerated. It may also be used to help people who have been exposed to high doses of radiation. It works by increasing the amount of platelets in your blood. This lowers the risk of bleeding. This medicine may be used for other purposes; ask your health care provider or pharmacist if you have questions. COMMON BRAND NAME(S): Nplate What should I tell my care team before I take this medication? They need to know if you have any of these conditions: Blood clots Myelodysplastic syndrome An unusual or allergic reaction to romiplostim, mannitol, other medications, foods, dyes, or preservatives Pregnant or trying to get pregnant Breast-feeding How should I use this medication? This medication is injected under the skin. It is given by a care team in a hospital or clinic setting. A special MedGuide will be given to you before each treatment. Be sure to read this information carefully each time. Talk to your care team about the use of this medication in children. While it may be prescribed for children as young as newborns for selected conditions, precautions do apply. Overdosage: If you think you have taken too much of this medicine contact a poison control center or emergency room at once. NOTE: This medicine is only for you. Do not share this medicine with others. What if I miss a dose? Keep appointments for follow-up doses. It is important not to miss your dose. Call your care team if you are unable to keep an appointment. What may interact with this medication? Interactions are not expected. This list may not describe all possible interactions. Give your health care provider a list of all the medicines, herbs, non-prescription drugs, or dietary supplements you use. Also  tell them if you smoke, drink alcohol, or use illegal drugs. Some items may interact with your medicine. What should I watch for while using this medication? Visit your care team for regular checks on your progress. You may need blood work done while you are taking this medication. Your condition will be monitored carefully while you are receiving this medication. It is important not to miss any appointments. What side effects may I notice from receiving this medication? Side effects that you should report to your care team as soon as possible: Allergic reactions--skin rash, itching, hives, swelling of the face, lips, tongue, or throat Blood clot--pain, swelling, or warmth in the leg, shortness of breath, chest pain Side effects that usually do not require medical attention (report to your care team if they continue or are bothersome): Dizziness Joint pain Muscle pain Pain in the hands or feet Stomach pain Trouble sleeping This list may not describe all possible side effects. Call your doctor for medical advice about side effects. You may report side effects to FDA at 1-800-FDA-1088. Where should I keep my medication? This medication is given in a hospital or clinic. It will not be stored at home. NOTE: This sheet is a summary. It may not cover all possible information. If you have questions about this medicine, talk to your doctor, pharmacist, or health care provider.  2023 Elsevier/Gold Standard (2021-05-28 00:00:00)  

## 2022-02-10 NOTE — Progress Notes (Signed)
[place] OFFICE PROGRESS NOTE   Diagnosis: Anemia, thrombocytopenia, prostate cancer  INTERVAL HISTORY:   [name] returns as scheduled.  He reports pain in various locations.  Last night he had pain in both feet and lower legs, left thigh, lower back.  Today he denies significant pain.  He takes Tylenol if needed.  Since his last visit he had a few skin lesions removed by his dermatologist.  His wife reports he felt significantly better when he took Casodex for 2 weeks.  She wonders if he can take Casodex again.  Objective:  Vital signs in last 24 hours:  Blood pressure (!) 145/66, pulse 73, temperature 98.2 F (36.8 C), temperature source Oral, resp. rate 20, height _0  (1.651 m), weight 153 lb (69.4 kg), SpO2 97 %.    Resp: Lungs clear bilaterally. Cardio: Regular rate and rhythm. GI: Abdomen soft and nontender.  No hepatomegaly. Vascular: Trace lower leg edema bilaterally. Skin: Bandages at the left side of the face.   Lab Results:  Lab Results  Component Value Date   WBC 8.8 02/10/2022   HGB 9.0 (L) 02/10/2022   HCT 28.7 (L) 02/10/2022   MCV 85.2 02/10/2022   PLT 88 (L) 02/10/2022   NEUTROABS 4.9 02/10/2022    Imaging:  No results found.  Medications: I have reviewed the patient's current medications.  Assessment/Plan: Thrombocytopenia Bone marrow biopsy 07/08/2016-no evidence of B-cell lymphoma, 40% cellular bone marrow with trilineage hematopoiesis, megakaryocytes present with normal morphology, normal cytogenetics, negative for BRAF mutation Flow cytometry 01/05/2009-no monoclonal B-cell or phenotypically abnormal T-cell population Prednisone starting 06/29/2019 Nplate 07/04/2019, 0/27/7412, Nplate 07/18/2019 Prednisone 20 mg daily beginning 07/25/2019 Platelets 11,000 on 08/15/2019, prednisone increased to 40 mg daily, Nplate resume 8/78/6767  Prednisone decreased to 30 mg daily 08/25/2019, Nplate continued Prednisone continued at 30 mg daily  09/13/2019, weekly Nplate continued Prednisone taper to 20 mg daily 09/23/2019, weekly Nplate continued Prednisone taper to 10 mg daily 09/30/2019, weekly Nplate continued Prednisone taper to 5 mg daily 10/21/2019, weekly Nplate continued Prednisone taper to 5 mg every other day for 5 doses then stop 11/11/2019, weekly Nplate continued Nplate last given 04/12/4707 Promacta cost prohibitive Nplate beginning 08/02/8364, held 08/27/2020 and 09/03/2020 due to patient vacation Nplate resumed 2/94/7654 Nplate held 08/05/352 secondary to an elevated platelet count Nplate resumed with dose reduction to 1 mcg/kg 10/09/2020 Nplate dose decreased to 0.5 mcg/kg 10/29/2020 Nplate dose increased to 1 mcg/kg 11/07/2020 Hairy cell leukemia 1982, status post splenectomy Coronary artery disease Aortic stenosis Liposarcoma at the right chest wall resected in 2013 Macular degeneration Hearing loss Basal cell carcinoma left cheek 05/24/2019 Left upper lobe nodule, faint FDG activity on PET at [facility] 05/31/2013 Left lung mass has been present since 2007, negative bronchoscopy December 2007 Status post TAVR procedure 09/06/2019 Admission with Pseudomonas urosepsis 09/16/2019 Acute left MCA distribution CVA 09/16/2019 Covid January 2022 Mild anemia, 1 of 3 stool Hemoccult cards positive for occult blood 10/14/2020, ferritin low 06/29/2020 Virtual colonoscopy 06/25/2021 -5 mm polypoid defect in the ascending colon 15.  Prostate cancer-sclerotic bone lesions/prostatic hypertrophy on CT 06/25/2021; Lupron every 3 months beginning 08/02/2021, bicalutamide 08/02/2021 for 2 weeks Abiraterone/prednisone 11/05/2021 Lupron every 3 months beginning 08/02/2021 12/16/2021 PSA higher (457) 12/26/2021 testosterone less than 3 Guardant360 01/13/2022-tumor mutation burden 8.61, MSI high not detected, NRAS G13D,PALB2 VUS Lupron every 3 months continued, Casodex 50 mg daily 16.  Pneumonia 09/30/2021    Disposition: Mr. Stencil has metastatic prostate cancer.   PSA continues to  increase.  There was initial improvement in the PSA when he started Lupron/bicalutamide.  He would like to resume bicalutamide.  We again reviewed potential side effects.  Prescription sent to his pharmacy.  Plan to repeat the PSA in about 3 weeks.  He and his wife are interested in a referral for a second opinion.    He has chronic thrombocytopenia, maintained on Nplate.  Plan to continue the same.  He will return for a follow-up appointment and PSA in 3 weeks.  Patient seen with Dr. Benay Spice.    [name] ANP/GNP-BC   02/10/2022  11:09 AM This was a shared visit with [name].  Mr. Monteforte appears unchanged.  We discussed treatment options for the prostate cancer.  He is not a candidate for chemotherapy.  The plan is to continue Lupron and add bicalutamide.  He continues weekly Nplate.  I was present for greater than 50% of today's visit.  I performed medical decision making.  Julieanne Manson, MD

## 2022-02-11 ENCOUNTER — Other Ambulatory Visit (HOSPITAL_COMMUNITY): Payer: Self-pay

## 2022-02-11 LAB — GUARDANT 360

## 2022-02-12 DIAGNOSIS — H35412 Lattice degeneration of retina, left eye: Secondary | ICD-10-CM | POA: Diagnosis not present

## 2022-02-12 DIAGNOSIS — Z961 Presence of intraocular lens: Secondary | ICD-10-CM | POA: Diagnosis not present

## 2022-02-12 DIAGNOSIS — H35372 Puckering of macula, left eye: Secondary | ICD-10-CM | POA: Diagnosis not present

## 2022-02-12 DIAGNOSIS — H353221 Exudative age-related macular degeneration, left eye, with active choroidal neovascularization: Secondary | ICD-10-CM | POA: Diagnosis not present

## 2022-02-12 DIAGNOSIS — H43821 Vitreomacular adhesion, right eye: Secondary | ICD-10-CM | POA: Diagnosis not present

## 2022-02-12 DIAGNOSIS — H353110 Nonexudative age-related macular degeneration, right eye, stage unspecified: Secondary | ICD-10-CM | POA: Diagnosis not present

## 2022-02-13 ENCOUNTER — Other Ambulatory Visit (HOSPITAL_COMMUNITY): Payer: Self-pay

## 2022-02-13 ENCOUNTER — Telehealth: Payer: Self-pay | Admitting: *Deleted

## 2022-02-13 NOTE — Progress Notes (Signed)
  Care Coordination Note  02/13/2022 Name: [name] MRN: [id number] DOB: 13-Mar-1923  [name] is a 88 y.o. year old male who is a primary care patient of [name], MD and is actively engaged with the care management team. I reached out to [name] by phone today to assist with re-scheduling a follow up visit with the RN Case Manager  Follow up plan: Unsuccessful telephone outreach attempt made. A HIPAA compliant phone message was left for the patient providing contact information and requesting a return call.   [place]  Direct Dial: [phone]

## 2022-02-17 ENCOUNTER — Inpatient Hospital Stay: Payer: Medicare Other

## 2022-02-17 VITALS — BP 132/60 | HR 62 | Temp 98.4°F | Resp 20 | Ht 65.0 in | Wt 153.1 lb

## 2022-02-17 DIAGNOSIS — C61 Malignant neoplasm of prostate: Secondary | ICD-10-CM | POA: Diagnosis not present

## 2022-02-17 DIAGNOSIS — D693 Immune thrombocytopenic purpura: Secondary | ICD-10-CM

## 2022-02-17 DIAGNOSIS — C7951 Secondary malignant neoplasm of bone: Secondary | ICD-10-CM | POA: Diagnosis not present

## 2022-02-17 DIAGNOSIS — D696 Thrombocytopenia, unspecified: Secondary | ICD-10-CM | POA: Diagnosis not present

## 2022-02-17 LAB — CBC WITH DIFFERENTIAL (CANCER CENTER ONLY)
Abs Immature Granulocytes: 0.43 10*3/uL — ABNORMAL HIGH (ref 0.00–0.07)
Basophils Absolute: 0.1 10*3/uL (ref 0.0–0.1)
Basophils Relative: 1 %
Eosinophils Absolute: 0.2 10*3/uL (ref 0.0–0.5)
Eosinophils Relative: 2 %
HCT: 28.1 % — ABNORMAL LOW (ref 39.0–52.0)
Hemoglobin: 8.7 g/dL — ABNORMAL LOW (ref 13.0–17.0)
Immature Granulocytes: 4 %
Lymphocytes Relative: 12 %
Lymphs Abs: 1.2 10*3/uL (ref 0.7–4.0)
MCH: 26.1 pg (ref 26.0–34.0)
MCHC: 31 g/dL (ref 30.0–36.0)
MCV: 84.4 fL (ref 80.0–100.0)
Monocytes Absolute: 1.9 10*3/uL — ABNORMAL HIGH (ref 0.1–1.0)
Monocytes Relative: 20 %
Neutro Abs: 5.9 10*3/uL (ref 1.7–7.7)
Neutrophils Relative %: 61 %
Platelet Count: 85 10*3/uL — ABNORMAL LOW (ref 150–400)
RBC: 3.33 MIL/uL — ABNORMAL LOW (ref 4.22–5.81)
RDW: 20.8 % — ABNORMAL HIGH (ref 11.5–15.5)
WBC Count: 9.8 10*3/uL (ref 4.0–10.5)
nRBC: 0.5 % — ABNORMAL HIGH (ref 0.0–0.2)

## 2022-02-17 MED ORDER — ROMIPLOSTIM 250 MCG ~~LOC~~ SOLR
150.0000 ug | Freq: Once | SUBCUTANEOUS | Status: AC
  Administered 2022-02-17: 150 ug via SUBCUTANEOUS
  Filled 2022-02-17: qty 0.3

## 2022-02-17 NOTE — Patient Instructions (Signed)
Romiplostim Injection What is this medication? ROMIPLOSTIM (roe mi PLOE stim) treats low levels of platelets in your body caused by immune thrombocytopenia (ITP). It is prescribed when other medications have not worked or cannot be tolerated. It may also be used to help people who have been exposed to high doses of radiation. It works by increasing the amount of platelets in your blood. This lowers the risk of bleeding. This medicine may be used for other purposes; ask your health care provider or pharmacist if you have questions. COMMON BRAND NAME(S): Nplate What should I tell my care team before I take this medication? They need to know if you have any of these conditions: Blood clots Myelodysplastic syndrome An unusual or allergic reaction to romiplostim, mannitol, other medications, foods, dyes, or preservatives Pregnant or trying to get pregnant Breast-feeding How should I use this medication? This medication is injected under the skin. It is given by a care team in a hospital or clinic setting. A special MedGuide will be given to you before each treatment. Be sure to read this information carefully each time. Talk to your care team about the use of this medication in children. While it may be prescribed for children as young as newborns for selected conditions, precautions do apply. Overdosage: If you think you have taken too much of this medicine contact a poison control center or emergency room at once. NOTE: This medicine is only for you. Do not share this medicine with others. What if I miss a dose? Keep appointments for follow-up doses. It is important not to miss your dose. Call your care team if you are unable to keep an appointment. What may interact with this medication? Interactions are not expected. This list may not describe all possible interactions. Give your health care provider a list of all the medicines, herbs, non-prescription drugs, or dietary supplements you use. Also  tell them if you smoke, drink alcohol, or use illegal drugs. Some items may interact with your medicine. What should I watch for while using this medication? Visit your care team for regular checks on your progress. You may need blood work done while you are taking this medication. Your condition will be monitored carefully while you are receiving this medication. It is important not to miss any appointments. What side effects may I notice from receiving this medication? Side effects that you should report to your care team as soon as possible: Allergic reactions--skin rash, itching, hives, swelling of the face, lips, tongue, or throat Blood clot--pain, swelling, or warmth in the leg, shortness of breath, chest pain Side effects that usually do not require medical attention (report to your care team if they continue or are bothersome): Dizziness Joint pain Muscle pain Pain in the hands or feet Stomach pain Trouble sleeping This list may not describe all possible side effects. Call your doctor for medical advice about side effects. You may report side effects to FDA at 1-800-FDA-1088. Where should I keep my medication? This medication is given in a hospital or clinic. It will not be stored at home. NOTE: This sheet is a summary. It may not cover all possible information. If you have questions about this medicine, talk to your doctor, pharmacist, or health care provider.  2023 Elsevier/Gold Standard (2021-05-28 00:00:00)  

## 2022-02-20 ENCOUNTER — Other Ambulatory Visit (HOSPITAL_COMMUNITY): Payer: Self-pay

## 2022-02-25 ENCOUNTER — Inpatient Hospital Stay: Payer: Medicare Other

## 2022-02-25 VITALS — BP 123/65 | HR 62 | Temp 98.5°F | Resp 20 | Ht 65.0 in | Wt 156.2 lb

## 2022-02-25 DIAGNOSIS — D696 Thrombocytopenia, unspecified: Secondary | ICD-10-CM | POA: Diagnosis not present

## 2022-02-25 DIAGNOSIS — D693 Immune thrombocytopenic purpura: Secondary | ICD-10-CM

## 2022-02-25 DIAGNOSIS — C61 Malignant neoplasm of prostate: Secondary | ICD-10-CM | POA: Diagnosis not present

## 2022-02-25 DIAGNOSIS — C7951 Secondary malignant neoplasm of bone: Secondary | ICD-10-CM | POA: Diagnosis not present

## 2022-02-25 LAB — CBC WITH DIFFERENTIAL (CANCER CENTER ONLY)
Abs Immature Granulocytes: 0.3 10*3/uL — ABNORMAL HIGH (ref 0.00–0.07)
Basophils Absolute: 0 10*3/uL (ref 0.0–0.1)
Basophils Relative: 0 %
Eosinophils Absolute: 0.2 10*3/uL (ref 0.0–0.5)
Eosinophils Relative: 2 %
HCT: 28 % — ABNORMAL LOW (ref 39.0–52.0)
Hemoglobin: 8.5 g/dL — ABNORMAL LOW (ref 13.0–17.0)
Lymphocytes Relative: 9 %
Lymphs Abs: 1 10*3/uL (ref 0.7–4.0)
MCH: 25.6 pg — ABNORMAL LOW (ref 26.0–34.0)
MCHC: 30.4 g/dL (ref 30.0–36.0)
MCV: 84.3 fL (ref 80.0–100.0)
Metamyelocytes Relative: 1 %
Monocytes Absolute: 0.7 10*3/uL (ref 0.1–1.0)
Monocytes Relative: 7 %
Myelocytes: 2 %
Neutro Abs: 8.5 10*3/uL — ABNORMAL HIGH (ref 1.7–7.7)
Neutrophils Relative %: 79 %
Platelet Count: 149 10*3/uL — ABNORMAL LOW (ref 150–400)
RBC: 3.32 MIL/uL — ABNORMAL LOW (ref 4.22–5.81)
RDW: 20.5 % — ABNORMAL HIGH (ref 11.5–15.5)
Smear Review: DECREASED
WBC Count: 10.7 10*3/uL — ABNORMAL HIGH (ref 4.0–10.5)
nRBC: 0.5 % — ABNORMAL HIGH (ref 0.0–0.2)
nRBC: 1 /100 WBC — ABNORMAL HIGH

## 2022-02-25 MED ORDER — ROMIPLOSTIM 250 MCG ~~LOC~~ SOLR
150.0000 ug | Freq: Once | SUBCUTANEOUS | Status: AC
  Administered 2022-02-25: 150 ug via SUBCUTANEOUS
  Filled 2022-02-25: qty 0.3

## 2022-02-25 NOTE — Progress Notes (Signed)
  Care Coordination Note  02/25/2022 Name: [name] MRN: [id number] DOB: [birth year]  [name] is a 88 y.o. year old male who is a primary care patient of [name], MD and is actively engaged with the care management team. I reached out to [name] by phone today to assist with re-scheduling a follow up visit with the RN Case Manager  Follow up plan: A second unsuccessful telephone outreach attempt made. A HIPAA compliant phone message was left for the patient providing contact information and requesting a return call.   [place]  Direct Dial: [phone]

## 2022-02-25 NOTE — Patient Instructions (Signed)
Romiplostim Injection What is this medication? ROMIPLOSTIM (roe mi PLOE stim) treats low levels of platelets in your body caused by immune thrombocytopenia (ITP). It is prescribed when other medications have not worked or cannot be tolerated. It may also be used to help people who have been exposed to high doses of radiation. It works by increasing the amount of platelets in your blood. This lowers the risk of bleeding. This medicine may be used for other purposes; ask your health care provider or pharmacist if you have questions. COMMON BRAND NAME(S): Nplate What should I tell my care team before I take this medication? They need to know if you have any of these conditions: Blood clots Myelodysplastic syndrome An unusual or allergic reaction to romiplostim, mannitol, other medications, foods, dyes, or preservatives Pregnant or trying to get pregnant Breast-feeding How should I use this medication? This medication is injected under the skin. It is given by a care team in a hospital or clinic setting. A special MedGuide will be given to you before each treatment. Be sure to read this information carefully each time. Talk to your care team about the use of this medication in children. While it may be prescribed for children as young as newborns for selected conditions, precautions do apply. Overdosage: If you think you have taken too much of this medicine contact a poison control center or emergency room at once. NOTE: This medicine is only for you. Do not share this medicine with others. What if I miss a dose? Keep appointments for follow-up doses. It is important not to miss your dose. Call your care team if you are unable to keep an appointment. What may interact with this medication? Interactions are not expected. This list may not describe all possible interactions. Give your health care provider a list of all the medicines, herbs, non-prescription drugs, or dietary supplements you use. Also  tell them if you smoke, drink alcohol, or use illegal drugs. Some items may interact with your medicine. What should I watch for while using this medication? Visit your care team for regular checks on your progress. You may need blood work done while you are taking this medication. Your condition will be monitored carefully while you are receiving this medication. It is important not to miss any appointments. What side effects may I notice from receiving this medication? Side effects that you should report to your care team as soon as possible: Allergic reactions--skin rash, itching, hives, swelling of the face, lips, tongue, or throat Blood clot--pain, swelling, or warmth in the leg, shortness of breath, chest pain Side effects that usually do not require medical attention (report to your care team if they continue or are bothersome): Dizziness Joint pain Muscle pain Pain in the hands or feet Stomach pain Trouble sleeping This list may not describe all possible side effects. Call your doctor for medical advice about side effects. You may report side effects to FDA at 1-800-FDA-1088. Where should I keep my medication? This medication is given in a hospital or clinic. It will not be stored at home. NOTE: This sheet is a summary. It may not cover all possible information. If you have questions about this medicine, talk to your doctor, pharmacist, or health care provider.  2023 Elsevier/Gold Standard (2021-05-28 00:00:00)  

## 2022-02-26 ENCOUNTER — Other Ambulatory Visit: Payer: Self-pay | Admitting: Interventional Cardiology

## 2022-03-04 ENCOUNTER — Encounter: Payer: Self-pay | Admitting: Oncology

## 2022-03-04 ENCOUNTER — Inpatient Hospital Stay: Payer: Medicare Other

## 2022-03-04 ENCOUNTER — Inpatient Hospital Stay: Payer: Medicare Other | Attending: Nurse Practitioner | Admitting: Oncology

## 2022-03-04 VITALS — BP 134/59 | HR 74 | Temp 97.9°F | Resp 18 | Ht 65.0 in | Wt 153.0 lb

## 2022-03-04 DIAGNOSIS — D693 Immune thrombocytopenic purpura: Secondary | ICD-10-CM

## 2022-03-04 DIAGNOSIS — C7951 Secondary malignant neoplasm of bone: Secondary | ICD-10-CM

## 2022-03-04 DIAGNOSIS — D696 Thrombocytopenia, unspecified: Secondary | ICD-10-CM | POA: Insufficient documentation

## 2022-03-04 DIAGNOSIS — C61 Malignant neoplasm of prostate: Secondary | ICD-10-CM

## 2022-03-04 LAB — CBC WITH DIFFERENTIAL (CANCER CENTER ONLY)
Abs Immature Granulocytes: 0.5 10*3/uL — ABNORMAL HIGH (ref 0.00–0.07)
Basophils Absolute: 0 10*3/uL (ref 0.0–0.1)
Basophils Relative: 0 %
Eosinophils Absolute: 0.1 10*3/uL (ref 0.0–0.5)
Eosinophils Relative: 1 %
HCT: 28.4 % — ABNORMAL LOW (ref 39.0–52.0)
Hemoglobin: 8.8 g/dL — ABNORMAL LOW (ref 13.0–17.0)
Lymphocytes Relative: 17 %
Lymphs Abs: 2.1 10*3/uL (ref 0.7–4.0)
MCH: 26 pg (ref 26.0–34.0)
MCHC: 31 g/dL (ref 30.0–36.0)
MCV: 83.8 fL (ref 80.0–100.0)
Metamyelocytes Relative: 1 %
Monocytes Absolute: 1.8 10*3/uL — ABNORMAL HIGH (ref 0.1–1.0)
Monocytes Relative: 15 %
Myelocytes: 3 %
Neutro Abs: 7.7 10*3/uL (ref 1.7–7.7)
Neutrophils Relative %: 63 %
Platelet Count: 162 10*3/uL (ref 150–400)
RBC: 3.39 MIL/uL — ABNORMAL LOW (ref 4.22–5.81)
RDW: 21.2 % — ABNORMAL HIGH (ref 11.5–15.5)
Smear Review: DECREASED
WBC Count: 12.3 10*3/uL — ABNORMAL HIGH (ref 4.0–10.5)
nRBC: 0.4 % — ABNORMAL HIGH (ref 0.0–0.2)
nRBC: 1 /100 WBC — ABNORMAL HIGH

## 2022-03-04 MED ORDER — ROMIPLOSTIM 250 MCG ~~LOC~~ SOLR
2.1500 ug/kg | Freq: Once | SUBCUTANEOUS | Status: AC
  Administered 2022-03-04: 150 ug via SUBCUTANEOUS
  Filled 2022-03-04: qty 0.3

## 2022-03-04 NOTE — Progress Notes (Signed)
  Care Coordination Note  03/04/2022 Name: KEENON LEITZEL MRN: [id number] DOB: 09/08/1923  CATON POPOWSKI is a 89 y.o. year old male who is a primary care patient of [name], MD and is actively engaged with the care management team. I reached out to [name] by phone today to assist with re-scheduling a follow up visit with the RN Case Manager  Follow up plan: Telephone appointment with care management team member scheduled for:03/14/22  [place]: [phone]

## 2022-03-04 NOTE — Progress Notes (Signed)
[place] OFFICE PROGRESS NOTE   Diagnosis: Prostate cancer, thrombocytopenia  INTERVAL HISTORY:   [name] returns as scheduled.  He continues weekly Nplate.  He began bicalutamide 02/10/2022.  He continues to have pain in the lower back.  He takes tramadol at night.  He complains of insomnia.  Objective:  Vital signs in last 24 hours:  Blood pressure (!) 134/59, pulse 74, temperature 97.9 F (36.6 C), temperature source Oral, resp. rate 18, height _0  (1.651 m), weight 153 lb (69.4 kg), SpO2 96 %.    HEENT: Mild white coat at the buccal mucosa and tongue Resp: Lungs clear bilaterally Cardio: Regular rate and rhythm GI: No hepatosplenomegaly Vascular: No leg edema  Lab Results:  Lab Results  Component Value Date   WBC 12.3 (H) 03/04/2022   HGB 8.8 (L) 03/04/2022   HCT 28.4 (L) 03/04/2022   MCV 83.8 03/04/2022   PLT 162 03/04/2022   NEUTROABS 7.7 03/04/2022    CMP  Lab Results  Component Value Date   NA 140 01/13/2022   K 3.8 01/13/2022   CL 108 01/13/2022   CO2 24 01/13/2022   GLUCOSE 129 (H) 01/13/2022   BUN 23 01/13/2022   CREATININE 1.25 (H) 01/13/2022   CALCIUM 8.5 (L) 01/13/2022   PROT 5.8 (L) 01/13/2022   ALBUMIN 3.6 01/13/2022   AST 19 01/13/2022   ALT 13 01/13/2022   ALKPHOS 933 (H) 01/13/2022   BILITOT 0.5 01/13/2022   GFRNONAA 53 (L) 01/13/2022   GFRAA 59 (L) 10/31/2019    Medications: I have reviewed the patient's current medications.   Assessment/Plan: Thrombocytopenia Bone marrow biopsy 07/08/2016-no evidence of B-cell lymphoma, 40% cellular bone marrow with trilineage hematopoiesis, megakaryocytes present with normal morphology, normal cytogenetics, negative for BRAF mutation Flow cytometry 01/05/2009-no monoclonal B-cell or phenotypically abnormal T-cell population Prednisone starting 06/29/2019 Nplate 07/04/2019, 0/96/0454, Nplate 07/18/2019 Prednisone 20 mg daily beginning 07/25/2019 Platelets 11,000 on 08/15/2019,  prednisone increased to 40 mg daily, Nplate resume 0/98/1191  Prednisone decreased to 30 mg daily 08/25/2019, Nplate continued Prednisone continued at 30 mg daily 09/13/2019, weekly Nplate continued Prednisone taper to 20 mg daily 09/23/2019, weekly Nplate continued Prednisone taper to 10 mg daily 09/30/2019, weekly Nplate continued Prednisone taper to 5 mg daily 10/21/2019, weekly Nplate continued Prednisone taper to 5 mg every other day for 5 doses then stop 11/11/2019, weekly Nplate continued Nplate last given 4/78/2956 Promacta cost prohibitive Nplate beginning 04/03/3084, held 08/27/2020 and 09/03/2020 due to patient vacation Nplate resumed 5/78/4696 Nplate held 04/11/5282 secondary to an elevated platelet count Nplate resumed with dose reduction to 1 mcg/kg 10/09/2020 Nplate dose decreased to 0.5 mcg/kg 10/29/2020 Nplate dose increased to 1 mcg/kg 11/07/2020 Hairy cell leukemia 1982, status post splenectomy Coronary artery disease Aortic stenosis Liposarcoma at the right chest wall resected in 2013 Macular degeneration Hearing loss Basal cell carcinoma left cheek 05/24/2019 Left upper lobe nodule, faint FDG activity on PET at [facility] 05/31/2013 Left lung mass has been present since 2007, negative bronchoscopy December 2007 Status post TAVR procedure 09/06/2019 Admission with Pseudomonas urosepsis 09/16/2019 Acute left MCA distribution CVA 09/16/2019 Covid January 2022 Mild anemia, 1 of 3 stool Hemoccult cards positive for occult blood 10/14/2020, ferritin low 06/29/2020 Virtual colonoscopy 06/25/2021 -5 mm polypoid defect in the ascending colon 15.  Prostate cancer-sclerotic bone lesions/prostatic hypertrophy on CT 06/25/2021; Lupron every 3 months beginning 08/02/2021, bicalutamide 08/02/2021 for 2 weeks Abiraterone/prednisone 11/05/2021 Lupron every 3 months beginning 08/02/2021 12/16/2021 PSA higher (457) 12/26/2021 testosterone less  than 3 Guardant360 01/13/2022-tumor mutation burden 8.61, MSI high not  detected, NRAS G13D,PALB2 VUS Lupron every 3 months continued, Casodex 50 mg daily 02/10/2022 16.  Pneumonia 09/30/2021      Disposition: [name] has metastatic prostate cancer.  He began a trial of bicalutamide on 02/10/2022.  He continues low-dose prednisone and every 95-monthleuprolide. He continues Nplate for treatment of chronic thrombocytopenia. Mr. SBrookenswill return for weekly Nplate.  He will be scheduled for an office visit and PSA in 2 weeks. GBetsy Coder MD  03/04/2022  9:49 AM

## 2022-03-05 ENCOUNTER — Encounter: Payer: Self-pay | Admitting: Oncology

## 2022-03-05 ENCOUNTER — Other Ambulatory Visit: Payer: Self-pay

## 2022-03-05 LAB — PROSTATE-SPECIFIC AG, SERUM (LABCORP): Prostate Specific Ag, Serum: 746 ng/mL — ABNORMAL HIGH (ref 0.0–4.0)

## 2022-03-06 ENCOUNTER — Other Ambulatory Visit (HOSPITAL_COMMUNITY): Payer: Self-pay

## 2022-03-06 ENCOUNTER — Encounter: Payer: Self-pay | Admitting: Oncology

## 2022-03-10 ENCOUNTER — Inpatient Hospital Stay: Payer: Medicare Other

## 2022-03-10 ENCOUNTER — Encounter: Payer: Self-pay | Admitting: Oncology

## 2022-03-10 VITALS — BP 140/58 | HR 60 | Temp 98.1°F | Resp 20 | Ht 65.0 in | Wt 154.2 lb

## 2022-03-10 DIAGNOSIS — D693 Immune thrombocytopenic purpura: Secondary | ICD-10-CM

## 2022-03-10 DIAGNOSIS — C61 Malignant neoplasm of prostate: Secondary | ICD-10-CM | POA: Diagnosis not present

## 2022-03-10 DIAGNOSIS — D696 Thrombocytopenia, unspecified: Secondary | ICD-10-CM | POA: Diagnosis not present

## 2022-03-10 LAB — CBC WITH DIFFERENTIAL (CANCER CENTER ONLY)
Abs Immature Granulocytes: 0.8 10*3/uL — ABNORMAL HIGH (ref 0.00–0.07)
Basophils Absolute: 0 10*3/uL (ref 0.0–0.1)
Basophils Relative: 0 %
Eosinophils Absolute: 0 10*3/uL (ref 0.0–0.5)
Eosinophils Relative: 0 %
HCT: 28.7 % — ABNORMAL LOW (ref 39.0–52.0)
Hemoglobin: 8.8 g/dL — ABNORMAL LOW (ref 13.0–17.0)
Lymphocytes Relative: 8 %
Lymphs Abs: 1 10*3/uL (ref 0.7–4.0)
MCH: 25.9 pg — ABNORMAL LOW (ref 26.0–34.0)
MCHC: 30.7 g/dL (ref 30.0–36.0)
MCV: 84.4 fL (ref 80.0–100.0)
Monocytes Absolute: 1.4 10*3/uL — ABNORMAL HIGH (ref 0.1–1.0)
Monocytes Relative: 11 %
Myelocytes: 4 %
Neutro Abs: 9.4 10*3/uL — ABNORMAL HIGH (ref 1.7–7.7)
Neutrophils Relative %: 75 %
Platelet Count: 192 10*3/uL (ref 150–400)
Promyelocytes Relative: 2 %
RBC: 3.4 MIL/uL — ABNORMAL LOW (ref 4.22–5.81)
RDW: 21.3 % — ABNORMAL HIGH (ref 11.5–15.5)
Smear Review: DECREASED
WBC Count: 12.5 10*3/uL — ABNORMAL HIGH (ref 4.0–10.5)
nRBC: 0.6 % — ABNORMAL HIGH (ref 0.0–0.2)
nRBC: 1 /100 WBC — ABNORMAL HIGH

## 2022-03-10 MED ORDER — ROMIPLOSTIM 250 MCG ~~LOC~~ SOLR
2.1500 ug/kg | Freq: Once | SUBCUTANEOUS | Status: AC
  Administered 2022-03-10: 150 ug via SUBCUTANEOUS
  Filled 2022-03-10: qty 0.3

## 2022-03-10 NOTE — Patient Instructions (Signed)
Romiplostim Injection What is this medication? ROMIPLOSTIM (roe mi PLOE stim) treats low levels of platelets in your body caused by immune thrombocytopenia (ITP). It is prescribed when other medications have not worked or cannot be tolerated. It may also be used to help people who have been exposed to high doses of radiation. It works by increasing the amount of platelets in your blood. This lowers the risk of bleeding. This medicine may be used for other purposes; ask your health care provider or pharmacist if you have questions. COMMON BRAND NAME(S): Nplate What should I tell my care team before I take this medication? They need to know if you have any of these conditions: Blood clots Myelodysplastic syndrome An unusual or allergic reaction to romiplostim, mannitol, other medications, foods, dyes, or preservatives Pregnant or trying to get pregnant Breast-feeding How should I use this medication? This medication is injected under the skin. It is given by a care team in a hospital or clinic setting. A special MedGuide will be given to you before each treatment. Be sure to read this information carefully each time. Talk to your care team about the use of this medication in children. While it may be prescribed for children as young as newborns for selected conditions, precautions do apply. Overdosage: If you think you have taken too much of this medicine contact a poison control center or emergency room at once. NOTE: This medicine is only for you. Do not share this medicine with others. What if I miss a dose? Keep appointments for follow-up doses. It is important not to miss your dose. Call your care team if you are unable to keep an appointment. What may interact with this medication? Interactions are not expected. This list may not describe all possible interactions. Give your health care provider a list of all the medicines, herbs, non-prescription drugs, or dietary supplements you use. Also  tell them if you smoke, drink alcohol, or use illegal drugs. Some items may interact with your medicine. What should I watch for while using this medication? Visit your care team for regular checks on your progress. You may need blood work done while you are taking this medication. Your condition will be monitored carefully while you are receiving this medication. It is important not to miss any appointments. What side effects may I notice from receiving this medication? Side effects that you should report to your care team as soon as possible: Allergic reactions--skin rash, itching, hives, swelling of the face, lips, tongue, or throat Blood clot--pain, swelling, or warmth in the leg, shortness of breath, chest pain Side effects that usually do not require medical attention (report to your care team if they continue or are bothersome): Dizziness Joint pain Muscle pain Pain in the hands or feet Stomach pain Trouble sleeping This list may not describe all possible side effects. Call your doctor for medical advice about side effects. You may report side effects to FDA at 1-800-FDA-1088. Where should I keep my medication? This medication is given in a hospital or clinic. It will not be stored at home. NOTE: This sheet is a summary. It may not cover all possible information. If you have questions about this medicine, talk to your doctor, pharmacist, or health care provider.  2023 Elsevier/Gold Standard (2021-05-28 00:00:00)  

## 2022-03-11 ENCOUNTER — Other Ambulatory Visit (HOSPITAL_COMMUNITY): Payer: Self-pay

## 2022-03-14 ENCOUNTER — Ambulatory Visit: Payer: Self-pay

## 2022-03-14 NOTE — Patient Outreach (Signed)
  Care Coordination   Follow Up Visit Note   03/14/2022 Name: [name] MRN: [id number] DOB: June 27, 1923  [name] is a 89 y.o. year old male who sees Douglas Lopes, MD for primary care. I spoke with  [name] by phone today.  What matters to the patients health and wellness today?  Patient will continue to follow MD recommendations for management of prostate cancer.     Goals Addressed               This Visit's Progress     Patient Stated     I was prescribed a new medication to help treat my prostate cancer (pt-stated)        Care Coordination Interventions: Completed successful outbound call with patient and wife [name]  Assessed patient understanding of cancer diagnosis and recommended treatment plan Reviewed upcoming provider appointments and treatment appointments Assessed available transportation to appointments and treatments. Has consistent/reliable transportation: Yes Determined patient received the list of resources to assist with transportation, he will access if needed Discussed at this time patient has the needed transportation for medical appointments, his family is available to assist at this time        SDOH assessments and interventions completed:  No     Care Coordination Interventions:  Yes, provided   Follow up plan: Follow up call scheduled for 05/13/22 '@11'$ :30 AM    Encounter Outcome:  Pt. Visit Completed

## 2022-03-14 NOTE — Patient Instructions (Signed)
Visit Information  Thank you for taking time to visit with me today. Please don't hesitate to contact me if I can be of assistance to you.   Following are the goals we discussed today:   Goals Addressed               This Visit's Progress     Patient Stated     I was prescribed a new medication to help treat my prostate cancer (pt-stated)        Care Coordination Interventions: Completed successful outbound call with patient and wife [name]  Assessed patient understanding of cancer diagnosis and recommended treatment plan Reviewed upcoming provider appointments and treatment appointments Assessed available transportation to appointments and treatments. Has consistent/reliable transportation: Yes Determined patient received the list of resources to assist with transportation, he will access if needed Discussed at this time patient has the needed transportation for medical appointments, his family is available to assist at this time        Our next appointment is by telephone on 05/13/22 at 11:30 AM  Please call the care guide team at [phone] if you need to cancel or reschedule your appointment.   If you are experiencing a Mental Health or [place] or need someone to talk to, please go to [facility] [address] [place] [phone])  Patient verbalizes understanding of instructions and care plan provided today and agrees to view in [place]. Active MyChart status and patient understanding of how to access instructions and care plan via MyChart confirmed with patient.     Barb Merino, RN, BSN, CCM Care Management Coordinator [facility] Care Management  Direct Phone: [phone]

## 2022-03-17 ENCOUNTER — Encounter: Payer: Self-pay | Admitting: Nurse Practitioner

## 2022-03-17 ENCOUNTER — Inpatient Hospital Stay: Payer: Medicare Other

## 2022-03-17 ENCOUNTER — Inpatient Hospital Stay: Payer: Medicare Other | Admitting: Oncology

## 2022-03-17 ENCOUNTER — Inpatient Hospital Stay (HOSPITAL_BASED_OUTPATIENT_CLINIC_OR_DEPARTMENT_OTHER): Payer: Medicare Other | Admitting: Nurse Practitioner

## 2022-03-17 VITALS — BP 128/56 | HR 73 | Temp 98.2°F | Resp 18 | Ht 65.0 in | Wt 152.2 lb

## 2022-03-17 DIAGNOSIS — D693 Immune thrombocytopenic purpura: Secondary | ICD-10-CM | POA: Diagnosis not present

## 2022-03-17 DIAGNOSIS — C61 Malignant neoplasm of prostate: Secondary | ICD-10-CM

## 2022-03-17 DIAGNOSIS — C7951 Secondary malignant neoplasm of bone: Secondary | ICD-10-CM | POA: Diagnosis not present

## 2022-03-17 DIAGNOSIS — D696 Thrombocytopenia, unspecified: Secondary | ICD-10-CM | POA: Diagnosis not present

## 2022-03-17 LAB — CBC WITH DIFFERENTIAL (CANCER CENTER ONLY)
Abs Immature Granulocytes: 0.65 10*3/uL — ABNORMAL HIGH (ref 0.00–0.07)
Basophils Absolute: 0.1 10*3/uL (ref 0.0–0.1)
Basophils Relative: 0 %
Eosinophils Absolute: 0.1 10*3/uL (ref 0.0–0.5)
Eosinophils Relative: 1 %
HCT: 27.6 % — ABNORMAL LOW (ref 39.0–52.0)
Hemoglobin: 8.8 g/dL — ABNORMAL LOW (ref 13.0–17.0)
Immature Granulocytes: 5 %
Lymphocytes Relative: 7 %
Lymphs Abs: 0.9 10*3/uL (ref 0.7–4.0)
MCH: 26 pg (ref 26.0–34.0)
MCHC: 31.9 g/dL (ref 30.0–36.0)
MCV: 81.7 fL (ref 80.0–100.0)
Monocytes Absolute: 2.5 10*3/uL — ABNORMAL HIGH (ref 0.1–1.0)
Monocytes Relative: 21 %
Neutro Abs: 7.9 10*3/uL — ABNORMAL HIGH (ref 1.7–7.7)
Neutrophils Relative %: 66 %
Platelet Count: 229 10*3/uL (ref 150–400)
RBC: 3.38 MIL/uL — ABNORMAL LOW (ref 4.22–5.81)
RDW: 20.9 % — ABNORMAL HIGH (ref 11.5–15.5)
WBC Count: 12.1 10*3/uL — ABNORMAL HIGH (ref 4.0–10.5)
nRBC: 0.6 % — ABNORMAL HIGH (ref 0.0–0.2)

## 2022-03-17 LAB — CMP (CANCER CENTER ONLY)
ALT: 10 U/L (ref 0–44)
AST: 19 U/L (ref 15–41)
Albumin: 3.7 g/dL (ref 3.5–5.0)
Alkaline Phosphatase: 1024 U/L — ABNORMAL HIGH (ref 38–126)
Anion gap: 10 (ref 5–15)
BUN: 26 mg/dL — ABNORMAL HIGH (ref 8–23)
CO2: 23 mmol/L (ref 22–32)
Calcium: 8.7 mg/dL — ABNORMAL LOW (ref 8.9–10.3)
Chloride: 105 mmol/L (ref 98–111)
Creatinine: 1.2 mg/dL (ref 0.61–1.24)
GFR, Estimated: 55 mL/min — ABNORMAL LOW (ref >60)
Glucose, Bld: 155 mg/dL — ABNORMAL HIGH (ref 70–99)
Potassium: 4 mmol/L (ref 3.5–5.1)
Sodium: 138 mmol/L (ref 135–145)
Total Bilirubin: 0.5 mg/dL (ref 0.3–1.2)
Total Protein: 6.1 g/dL — ABNORMAL LOW (ref 6.5–8.1)

## 2022-03-17 MED ORDER — ROMIPLOSTIM 250 MCG ~~LOC~~ SOLR
2.1500 ug/kg | Freq: Once | SUBCUTANEOUS | Status: AC
  Administered 2022-03-17: 150 ug via SUBCUTANEOUS
  Filled 2022-03-17: qty 0.3

## 2022-03-17 NOTE — Patient Instructions (Signed)
Romiplostim Injection What is this medication? ROMIPLOSTIM (roe mi PLOE stim) treats low levels of platelets in your body caused by immune thrombocytopenia (ITP). It is prescribed when other medications have not worked or cannot be tolerated. It may also be used to help people who have been exposed to high doses of radiation. It works by increasing the amount of platelets in your blood. This lowers the risk of bleeding. This medicine may be used for other purposes; ask your health care provider or pharmacist if you have questions. COMMON BRAND NAME(S): Nplate What should I tell my care team before I take this medication? They need to know if you have any of these conditions: Blood clots Myelodysplastic syndrome An unusual or allergic reaction to romiplostim, mannitol, other medications, foods, dyes, or preservatives Pregnant or trying to get pregnant Breast-feeding How should I use this medication? This medication is injected under the skin. It is given by a care team in a hospital or clinic setting. A special MedGuide will be given to you before each treatment. Be sure to read this information carefully each time. Talk to your care team about the use of this medication in children. While it may be prescribed for children as young as newborns for selected conditions, precautions do apply. Overdosage: If you think you have taken too much of this medicine contact a poison control center or emergency room at once. NOTE: This medicine is only for you. Do not share this medicine with others. What if I miss a dose? Keep appointments for follow-up doses. It is important not to miss your dose. Call your care team if you are unable to keep an appointment. What may interact with this medication? Interactions are not expected. This list may not describe all possible interactions. Give your health care provider a list of all the medicines, herbs, non-prescription drugs, or dietary supplements you use. Also  tell them if you smoke, drink alcohol, or use illegal drugs. Some items may interact with your medicine. What should I watch for while using this medication? Visit your care team for regular checks on your progress. You may need blood work done while you are taking this medication. Your condition will be monitored carefully while you are receiving this medication. It is important not to miss any appointments. What side effects may I notice from receiving this medication? Side effects that you should report to your care team as soon as possible: Allergic reactions--skin rash, itching, hives, swelling of the face, lips, tongue, or throat Blood clot--pain, swelling, or warmth in the leg, shortness of breath, chest pain Side effects that usually do not require medical attention (report to your care team if they continue or are bothersome): Dizziness Joint pain Muscle pain Pain in the hands or feet Stomach pain Trouble sleeping This list may not describe all possible side effects. Call your doctor for medical advice about side effects. You may report side effects to FDA at 1-800-FDA-1088. Where should I keep my medication? This medication is given in a hospital or clinic. It will not be stored at home. NOTE: This sheet is a summary. It may not cover all possible information. If you have questions about this medicine, talk to your doctor, pharmacist, or health care provider.  2023 Elsevier/Gold Standard (2021-05-28 00:00:00)

## 2022-03-17 NOTE — Progress Notes (Signed)
[name] OFFICE PROGRESS NOTE   Diagnosis: Prostate cancer, thrombocytopenia  INTERVAL HISTORY:   [name] returns as scheduled.  He continues weekly Nplate.  No bleeding.  He reports "more bone pain".  Pain is mainly located at the hips and shins.  He takes Tylenol and tramadol.  Bowels moving every 2 to 3 days.  Wife notes some congestion at nighttime.  Objective:  Vital signs in last 24 hours:  Blood pressure (!) 128/56, pulse 73, temperature 98.2 F (36.8 C), temperature source Oral, resp. rate 18, height '5\' 5"'$  (1.651 m), weight 152 lb 3.2 oz (69 kg), SpO2 98 %.    Resp: Lungs clear bilaterally. Cardio: Regular rate and rhythm, occasional premature beat. GI: No hepatosplenomegaly. Vascular: No leg edema.  Left lower leg is slightly larger than the right lower leg.   Lab Results:  Lab Results  Component Value Date   WBC 12.5 (H) 03/10/2022   HGB 8.8 (L) 03/10/2022   HCT 28.7 (L) 03/10/2022   MCV 84.4 03/10/2022   PLT 192 03/10/2022   NEUTROABS 9.4 (H) 03/10/2022    Imaging:  No results found.  Medications: I have reviewed the patient's current medications.  Assessment/Plan: Thrombocytopenia Bone marrow biopsy 07/08/2016-no evidence of B-cell lymphoma, 40% cellular bone marrow with trilineage hematopoiesis, megakaryocytes present with normal morphology, normal cytogenetics, negative for BRAF mutation Flow cytometry 01/05/2009-no monoclonal B-cell or phenotypically abnormal T-cell population Prednisone starting 06/29/2019 Nplate 07/04/2019, 0/96/2836, Nplate 07/18/2019 Prednisone 20 mg daily beginning 07/25/2019 Platelets 11,000 on 08/15/2019, prednisone increased to 40 mg daily, Nplate resume 08/30/4763  Prednisone decreased to 30 mg daily 08/25/2019, Nplate continued Prednisone continued at 30 mg daily 09/13/2019, weekly Nplate continued Prednisone taper to 20 mg daily 09/23/2019, weekly Nplate continued Prednisone taper to 10 mg daily 09/30/2019, weekly  Nplate continued Prednisone taper to 5 mg daily 10/21/2019, weekly Nplate continued Prednisone taper to 5 mg every other day for 5 doses then stop 11/11/2019, weekly Nplate continued Nplate last given 4/65/0354 Promacta cost prohibitive Nplate beginning 08/06/6810, held 08/27/2020 and 09/03/2020 due to patient vacation Nplate resumed 7/51/7001 Nplate held 09/04/9447 secondary to an elevated platelet count Nplate resumed with dose reduction to 1 mcg/kg 10/09/2020 Nplate dose decreased to 0.5 mcg/kg 10/29/2020 Nplate dose increased to 1 mcg/kg 11/07/2020 Hairy cell leukemia 1982, status post splenectomy Coronary artery disease Aortic stenosis Liposarcoma at the right chest wall resected in 2013 Macular degeneration Hearing loss Basal cell carcinoma left cheek 05/24/2019 Left upper lobe nodule, faint FDG activity on PET at [facility] 05/31/2013 Left lung mass has been present since 2007, negative bronchoscopy December 2007 Status post TAVR procedure 09/06/2019 Admission with Pseudomonas urosepsis 09/16/2019 Acute left MCA distribution CVA 09/16/2019 Covid January 2022 Mild anemia, 1 of 3 stool Hemoccult cards positive for occult blood 10/14/2020, ferritin low 06/29/2020 Virtual colonoscopy 06/25/2021 -5 mm polypoid defect in the ascending colon 15.  Prostate cancer-sclerotic bone lesions/prostatic hypertrophy on CT 06/25/2021; Lupron every 3 months beginning 08/02/2021, bicalutamide 08/02/2021 for 2 weeks Abiraterone/prednisone 11/05/2021 Lupron every 3 months beginning 08/02/2021 12/16/2021 PSA higher (457) 12/26/2021 testosterone less than 3 Guardant360 01/13/2022-tumor mutation burden 8.61, MSI high not detected, NRAS G13D,PALB2 VUS Lupron every 3 months continued, Casodex 50 mg daily 02/10/2022 16.  Pneumonia 09/30/2021    Disposition: [name] appears unchanged.  He will continue Casodex 50 mg daily.  We will follow-up on the PSA from today.  We reviewed the CBC from today.  Platelet count is in normal range.  He  will receive Nplate today as scheduled.  He will return for lab and Nplate in 1 week.  We will see him in follow-up in 2 weeks.     [name] ANP/GNP-BC   03/17/2022  10:40 AM

## 2022-03-18 LAB — PROSTATE-SPECIFIC AG, SERUM (LABCORP): Prostate Specific Ag, Serum: 749 ng/mL — ABNORMAL HIGH (ref 0.0–4.0)

## 2022-03-19 DIAGNOSIS — H353221 Exudative age-related macular degeneration, left eye, with active choroidal neovascularization: Secondary | ICD-10-CM | POA: Diagnosis not present

## 2022-03-24 ENCOUNTER — Inpatient Hospital Stay: Payer: Medicare Other

## 2022-03-24 VITALS — BP 133/59 | HR 61 | Temp 98.0°F | Resp 18 | Ht 65.0 in | Wt 154.2 lb

## 2022-03-24 DIAGNOSIS — C61 Malignant neoplasm of prostate: Secondary | ICD-10-CM | POA: Diagnosis not present

## 2022-03-24 DIAGNOSIS — D696 Thrombocytopenia, unspecified: Secondary | ICD-10-CM | POA: Diagnosis not present

## 2022-03-24 DIAGNOSIS — D693 Immune thrombocytopenic purpura: Secondary | ICD-10-CM

## 2022-03-24 LAB — CBC WITH DIFFERENTIAL (CANCER CENTER ONLY)
Abs Immature Granulocytes: 0.8 10*3/uL — ABNORMAL HIGH (ref 0.00–0.07)
Basophils Absolute: 0.2 10*3/uL — ABNORMAL HIGH (ref 0.0–0.1)
Basophils Relative: 2 %
Eosinophils Absolute: 0.2 10*3/uL (ref 0.0–0.5)
Eosinophils Relative: 2 %
HCT: 28.3 % — ABNORMAL LOW (ref 39.0–52.0)
Hemoglobin: 8.7 g/dL — ABNORMAL LOW (ref 13.0–17.0)
Lymphocytes Relative: 14 %
Lymphs Abs: 1.4 10*3/uL (ref 0.7–4.0)
MCH: 25.6 pg — ABNORMAL LOW (ref 26.0–34.0)
MCHC: 30.7 g/dL (ref 30.0–36.0)
MCV: 83.2 fL (ref 80.0–100.0)
Metamyelocytes Relative: 1 %
Monocytes Absolute: 1.1 10*3/uL — ABNORMAL HIGH (ref 0.1–1.0)
Monocytes Relative: 11 %
Myelocytes: 3 %
Neutro Abs: 6.5 10*3/uL (ref 1.7–7.7)
Neutrophils Relative %: 63 %
Platelet Count: 223 10*3/uL (ref 150–400)
Promyelocytes Relative: 4 %
RBC: 3.4 MIL/uL — ABNORMAL LOW (ref 4.22–5.81)
RDW: 21.3 % — ABNORMAL HIGH (ref 11.5–15.5)
WBC Count: 10.3 10*3/uL (ref 4.0–10.5)
nRBC: 0.7 % — ABNORMAL HIGH (ref 0.0–0.2)
nRBC: 1 /100 WBC — ABNORMAL HIGH

## 2022-03-24 MED ORDER — ROMIPLOSTIM 125 MCG ~~LOC~~ SOLR
1.0000 ug/kg | Freq: Once | SUBCUTANEOUS | Status: AC
  Administered 2022-03-24: 70 ug via SUBCUTANEOUS
  Filled 2022-03-24: qty 0.14

## 2022-03-24 NOTE — Patient Instructions (Signed)
Romiplostim Injection What is this medication? ROMIPLOSTIM (roe mi PLOE stim) treats low levels of platelets in your body caused by immune thrombocytopenia (ITP). It is prescribed when other medications have not worked or cannot be tolerated. It may also be used to help people who have been exposed to high doses of radiation. It works by increasing the amount of platelets in your blood. This lowers the risk of bleeding. This medicine may be used for other purposes; ask your health care provider or pharmacist if you have questions. COMMON BRAND NAME(S): Nplate What should I tell my care team before I take this medication? They need to know if you have any of these conditions: Blood clots Myelodysplastic syndrome An unusual or allergic reaction to romiplostim, mannitol, other medications, foods, dyes, or preservatives Pregnant or trying to get pregnant Breast-feeding How should I use this medication? This medication is injected under the skin. It is given by a care team in a hospital or clinic setting. A special MedGuide will be given to you before each treatment. Be sure to read this information carefully each time. Talk to your care team about the use of this medication in children. While it may be prescribed for children as young as newborns for selected conditions, precautions do apply. Overdosage: If you think you have taken too much of this medicine contact a poison control center or emergency room at once. NOTE: This medicine is only for you. Do not share this medicine with others. What if I miss a dose? Keep appointments for follow-up doses. It is important not to miss your dose. Call your care team if you are unable to keep an appointment. What may interact with this medication? Interactions are not expected. This list may not describe all possible interactions. Give your health care provider a list of all the medicines, herbs, non-prescription drugs, or dietary supplements you use. Also  tell them if you smoke, drink alcohol, or use illegal drugs. Some items may interact with your medicine. What should I watch for while using this medication? Visit your care team for regular checks on your progress. You may need blood work done while you are taking this medication. Your condition will be monitored carefully while you are receiving this medication. It is important not to miss any appointments. What side effects may I notice from receiving this medication? Side effects that you should report to your care team as soon as possible: Allergic reactions--skin rash, itching, hives, swelling of the face, lips, tongue, or throat Blood clot--pain, swelling, or warmth in the leg, shortness of breath, chest pain Side effects that usually do not require medical attention (report to your care team if they continue or are bothersome): Dizziness Joint pain Muscle pain Pain in the hands or feet Stomach pain Trouble sleeping This list may not describe all possible side effects. Call your doctor for medical advice about side effects. You may report side effects to FDA at 1-800-FDA-1088. Where should I keep my medication? This medication is given in a hospital or clinic. It will not be stored at home. NOTE: This sheet is a summary. It may not cover all possible information. If you have questions about this medicine, talk to your doctor, pharmacist, or health care provider.  2023 Elsevier/Gold Standard (2021-05-28 00:00:00)

## 2022-03-24 NOTE — Progress Notes (Signed)
Since Pltc > 200 x 2 consecutive weeks, ok to decrease Nplate dose per protocol to 1 mcg/kg per [name], NP & Dr. Benay Spice.  Kennith Center, Pharm.D., CPP 03/24/2022'@11'$ :57 AM

## 2022-03-27 ENCOUNTER — Ambulatory Visit
Admission: RE | Admit: 2022-03-27 | Discharge: 2022-03-27 | Disposition: A | Discharge status: Home / Self Care | Payer: Self-pay | Source: Ambulatory Visit | Attending: Oncology | Admitting: Oncology

## 2022-03-27 ENCOUNTER — Other Ambulatory Visit: Payer: Self-pay | Admitting: *Deleted

## 2022-03-27 DIAGNOSIS — C61 Malignant neoplasm of prostate: Secondary | ICD-10-CM

## 2022-03-28 ENCOUNTER — Ambulatory Visit
Admission: RE | Admit: 2022-03-28 | Discharge: 2022-03-28 | Disposition: A | Discharge status: Home / Self Care | Payer: Self-pay | Source: Ambulatory Visit | Attending: Oncology | Admitting: Oncology

## 2022-03-28 ENCOUNTER — Other Ambulatory Visit (HOSPITAL_COMMUNITY): Payer: Self-pay | Admitting: Oncology

## 2022-03-28 DIAGNOSIS — C801 Malignant (primary) neoplasm, unspecified: Secondary | ICD-10-CM

## 2022-03-31 ENCOUNTER — Inpatient Hospital Stay: Payer: Medicare Other

## 2022-03-31 ENCOUNTER — Ambulatory Visit
Admission: RE | Admit: 2022-03-31 | Discharge: 2022-03-31 | Disposition: A | Discharge status: Home / Self Care | Payer: Self-pay | Source: Ambulatory Visit | Attending: Oncology | Admitting: Oncology

## 2022-03-31 ENCOUNTER — Inpatient Hospital Stay (HOSPITAL_BASED_OUTPATIENT_CLINIC_OR_DEPARTMENT_OTHER): Payer: Medicare Other | Admitting: Oncology

## 2022-03-31 ENCOUNTER — Other Ambulatory Visit: Payer: Self-pay | Admitting: *Deleted

## 2022-03-31 VITALS — BP 155/58 | HR 75 | Temp 98.2°F | Resp 18 | Ht 65.0 in | Wt 153.0 lb

## 2022-03-31 DIAGNOSIS — C61 Malignant neoplasm of prostate: Secondary | ICD-10-CM

## 2022-03-31 DIAGNOSIS — D696 Thrombocytopenia, unspecified: Secondary | ICD-10-CM | POA: Diagnosis not present

## 2022-03-31 DIAGNOSIS — D693 Immune thrombocytopenic purpura: Secondary | ICD-10-CM

## 2022-03-31 DIAGNOSIS — C7951 Secondary malignant neoplasm of bone: Secondary | ICD-10-CM

## 2022-03-31 LAB — CBC WITH DIFFERENTIAL (CANCER CENTER ONLY)
Abs Immature Granulocytes: 1 10*3/uL — ABNORMAL HIGH (ref 0.00–0.07)
Basophils Absolute: 0 10*3/uL (ref 0.0–0.1)
Basophils Relative: 0 %
Eosinophils Absolute: 0 10*3/uL (ref 0.0–0.5)
Eosinophils Relative: 0 %
HCT: 27.4 % — ABNORMAL LOW (ref 39.0–52.0)
Hemoglobin: 8.6 g/dL — ABNORMAL LOW (ref 13.0–17.0)
Lymphocytes Relative: 13 %
Lymphs Abs: 1.6 10*3/uL (ref 0.7–4.0)
MCH: 25.7 pg — ABNORMAL LOW (ref 26.0–34.0)
MCHC: 31.4 g/dL (ref 30.0–36.0)
MCV: 81.8 fL (ref 80.0–100.0)
Metamyelocytes Relative: 3 %
Monocytes Absolute: 1.8 10*3/uL — ABNORMAL HIGH (ref 0.1–1.0)
Monocytes Relative: 15 %
Myelocytes: 5 %
Neutro Abs: 7.8 10*3/uL — ABNORMAL HIGH (ref 1.7–7.7)
Neutrophils Relative %: 64 %
Platelet Count: 178 10*3/uL (ref 150–400)
RBC: 3.35 MIL/uL — ABNORMAL LOW (ref 4.22–5.81)
RDW: 21.3 % — ABNORMAL HIGH (ref 11.5–15.5)
WBC Count: 12.2 10*3/uL — ABNORMAL HIGH (ref 4.0–10.5)
nRBC: 0.5 % — ABNORMAL HIGH (ref 0.0–0.2)

## 2022-03-31 LAB — CMP (CANCER CENTER ONLY)
ALT: 12 U/L (ref 0–44)
AST: 19 U/L (ref 15–41)
Albumin: 3.8 g/dL (ref 3.5–5.0)
Alkaline Phosphatase: 1062 U/L — ABNORMAL HIGH (ref 38–126)
Anion gap: 10 (ref 5–15)
BUN: 27 mg/dL — ABNORMAL HIGH (ref 8–23)
CO2: 23 mmol/L (ref 22–32)
Calcium: 8.8 mg/dL — ABNORMAL LOW (ref 8.9–10.3)
Chloride: 106 mmol/L (ref 98–111)
Creatinine: 1.21 mg/dL (ref 0.61–1.24)
GFR, Estimated: 55 mL/min — ABNORMAL LOW (ref >60)
Glucose, Bld: 98 mg/dL (ref 70–99)
Potassium: 4.2 mmol/L (ref 3.5–5.1)
Sodium: 139 mmol/L (ref 135–145)
Total Bilirubin: 0.5 mg/dL (ref 0.3–1.2)
Total Protein: 6.5 g/dL (ref 6.5–8.1)

## 2022-03-31 MED ORDER — ROMIPLOSTIM 125 MCG ~~LOC~~ SOLR
1.0000 ug/kg | Freq: Once | SUBCUTANEOUS | Status: AC
  Administered 2022-03-31: 70 ug via SUBCUTANEOUS
  Filled 2022-03-31: qty 0.14

## 2022-03-31 NOTE — Progress Notes (Signed)
[place] OFFICE PROGRESS NOTE   Diagnosis: Thrombocytopenia, prostate cancer  INTERVAL HISTORY:   [name] returns as scheduled.  He continues weekly Nplate.  He is taking prednisone and bicalutamide.  He reports increased pain at the lower back.  The pain is not relieved with one half of a tramadol tablet taken twice daily.  He reports a poor appetite.  Objective:  Vital signs in last 24 hours:  Blood pressure (!) 155/58, pulse 75, temperature 98.2 F (36.8 C), temperature source Oral, resp. rate 18, height '5\' 5"'$  (1.651 m), weight 153 lb (69.4 kg), SpO2 95 %.   Resp: Lungs with scattered end inspiratory rhonchi, no respiratory distress Cardio: Regular rate and rhythm GI: No hepatomegaly Vascular: No leg edema    Lab Results:  Lab Results  Component Value Date   WBC 12.2 (H) 03/31/2022   HGB 8.6 (L) 03/31/2022   HCT 27.4 (L) 03/31/2022   MCV 81.8 03/31/2022   PLT 178 03/31/2022   NEUTROABS 7.8 (H) 03/31/2022    CMP  Lab Results  Component Value Date   NA 139 03/31/2022   K 4.2 03/31/2022   CL 106 03/31/2022   CO2 23 03/31/2022   GLUCOSE 98 03/31/2022   BUN 27 (H) 03/31/2022   CREATININE 1.21 03/31/2022   CALCIUM 8.8 (L) 03/31/2022   PROT 6.5 03/31/2022   ALBUMIN 3.8 03/31/2022   AST 19 03/31/2022   ALT 12 03/31/2022   ALKPHOS 1,062 (H) 03/31/2022   BILITOT 0.5 03/31/2022   GFRNONAA 55 (L) 03/31/2022   GFRAA 59 (L) 10/31/2019    Medications: I have reviewed the patient's current medications.   Assessment/Plan: Thrombocytopenia Bone marrow biopsy 07/08/2016-no evidence of B-cell lymphoma, 40% cellular bone marrow with trilineage hematopoiesis, megakaryocytes present with normal morphology, normal cytogenetics, negative for BRAF mutation Flow cytometry 01/05/2009-no monoclonal B-cell or phenotypically abnormal T-cell population Prednisone starting 06/29/2019 Nplate 07/04/2019, 0/26/3785, Nplate 07/18/2019 Prednisone 20 mg daily beginning  07/25/2019 Platelets 11,000 on 08/15/2019, prednisone increased to 40 mg daily, Nplate resume 8/85/0277  Prednisone decreased to 30 mg daily 08/25/2019, Nplate continued Prednisone continued at 30 mg daily 09/13/2019, weekly Nplate continued Prednisone taper to 20 mg daily 09/23/2019, weekly Nplate continued Prednisone taper to 10 mg daily 09/30/2019, weekly Nplate continued Prednisone taper to 5 mg daily 10/21/2019, weekly Nplate continued Prednisone taper to 5 mg every other day for 5 doses then stop 11/11/2019, weekly Nplate continued Nplate last given 06/12/8784 Promacta cost prohibitive Nplate beginning 09/06/7207, held 08/27/2020 and 09/03/2020 due to patient vacation Nplate resumed 4/70/9628 Nplate held 05/06/6292 secondary to an elevated platelet count Nplate resumed with dose reduction to 1 mcg/kg 10/09/2020 Nplate dose decreased to 0.5 mcg/kg 10/29/2020 Nplate dose increased to 1 mcg/kg 11/07/2020 Hairy cell leukemia 1982, status post splenectomy Coronary artery disease Aortic stenosis Liposarcoma at the right chest wall resected in 2013 Macular degeneration Hearing loss Basal cell carcinoma left cheek 05/24/2019 Left upper lobe nodule, faint FDG activity on PET at [facility] 05/31/2013 Left lung mass has been present since 2007, negative bronchoscopy December 2007 Status post TAVR procedure 09/06/2019 Admission with Pseudomonas urosepsis 09/16/2019 Acute left MCA distribution CVA 09/16/2019 Covid January 2022 Mild anemia, 1 of 3 stool Hemoccult cards positive for occult blood 10/14/2020, ferritin low 06/29/2020 Virtual colonoscopy 06/25/2021 -5 mm polypoid defect in the ascending colon 15.  Prostate cancer-sclerotic bone lesions/prostatic hypertrophy on CT 06/25/2021; Lupron every 3 months beginning 08/02/2021, bicalutamide 08/02/2021 for 2 weeks Abiraterone/prednisone 11/05/2021 Lupron every 3 months beginning  08/02/2021 12/16/2021 PSA higher (457) 12/26/2021 testosterone less than 3 Guardant360 01/13/2022-tumor  mutation burden 8.61, MSI high not detected, NRAS G13D,PALB2 VUS Lupron every 3 months continued, Casodex 50 mg daily 02/10/2022 16.  Pneumonia 09/30/2021      Disposition: [name] has metastatic prostate cancer, currently treated with prednisone and bicalutamide.  He has increased pain.  We will follow-up on the PSA from today.  Bicalutamide will be discontinued if the PSA is higher.  [name] will increase the tramadol dose to 50 mg as needed.  He will also use Tylenol.  I will refer him to radiation oncology to consider palliative radiation to the lumbar spine.  The area of discomfort appears to be localized to the lower lumbar spine.  [name] is not a candidate for chemotherapy.  We can consider radium 223 or Pluvicto if palliative radiation does not help.  He will return for an office visit in 2 weeks.  He continues weekly Nplate for treatment of chronic thrombocytopenia.  Betsy Coder, MD  03/31/2022  5:18 PM

## 2022-03-31 NOTE — Patient Instructions (Signed)
Romiplostim Injection What is this medication? ROMIPLOSTIM (roe mi PLOE stim) treats low levels of platelets in your body caused by immune thrombocytopenia (ITP). It is prescribed when other medications have not worked or cannot be tolerated. It may also be used to help people who have been exposed to high doses of radiation. It works by increasing the amount of platelets in your blood. This lowers the risk of bleeding. This medicine may be used for other purposes; ask your health care provider or pharmacist if you have questions. COMMON BRAND NAME(S): Nplate What should I tell my care team before I take this medication? They need to know if you have any of these conditions: Blood clots Myelodysplastic syndrome An unusual or allergic reaction to romiplostim, mannitol, other medications, foods, dyes, or preservatives Pregnant or trying to get pregnant Breast-feeding How should I use this medication? This medication is injected under the skin. It is given by a care team in a hospital or clinic setting. A special MedGuide will be given to you before each treatment. Be sure to read this information carefully each time. Talk to your care team about the use of this medication in children. While it may be prescribed for children as young as newborns for selected conditions, precautions do apply. Overdosage: If you think you have taken too much of this medicine contact a poison control center or emergency room at once. NOTE: This medicine is only for you. Do not share this medicine with others. What if I miss a dose? Keep appointments for follow-up doses. It is important not to miss your dose. Call your care team if you are unable to keep an appointment. What may interact with this medication? Interactions are not expected. This list may not describe all possible interactions. Give your health care provider a list of all the medicines, herbs, non-prescription drugs, or dietary supplements you use. Also  tell them if you smoke, drink alcohol, or use illegal drugs. Some items may interact with your medicine. What should I watch for while using this medication? Visit your care team for regular checks on your progress. You may need blood work done while you are taking this medication. Your condition will be monitored carefully while you are receiving this medication. It is important not to miss any appointments. What side effects may I notice from receiving this medication? Side effects that you should report to your care team as soon as possible: Allergic reactions--skin rash, itching, hives, swelling of the face, lips, tongue, or throat Blood clot--pain, swelling, or warmth in the leg, shortness of breath, chest pain Side effects that usually do not require medical attention (report to your care team if they continue or are bothersome): Dizziness Joint pain Muscle pain Pain in the hands or feet Stomach pain Trouble sleeping This list may not describe all possible side effects. Call your doctor for medical advice about side effects. You may report side effects to FDA at 1-800-FDA-1088. Where should I keep my medication? This medication is given in a hospital or clinic. It will not be stored at home. NOTE: This sheet is a summary. It may not cover all possible information. If you have questions about this medicine, talk to your doctor, pharmacist, or health care provider.  2023 Elsevier/Gold Standard (2021-05-28 00:00:00)

## 2022-04-01 ENCOUNTER — Emergency Department (HOSPITAL_BASED_OUTPATIENT_CLINIC_OR_DEPARTMENT_OTHER): Payer: Medicare Other | Admitting: Radiology

## 2022-04-01 ENCOUNTER — Emergency Department (HOSPITAL_BASED_OUTPATIENT_CLINIC_OR_DEPARTMENT_OTHER)
Admission: EM | Admit: 2022-04-01 | Discharge: 2022-04-01 | Disposition: A | Discharge status: Home / Self Care | Payer: Medicare Other | Attending: Emergency Medicine | Admitting: Emergency Medicine

## 2022-04-01 ENCOUNTER — Telehealth: Payer: Self-pay

## 2022-04-01 ENCOUNTER — Other Ambulatory Visit: Payer: Self-pay

## 2022-04-01 ENCOUNTER — Encounter (HOSPITAL_BASED_OUTPATIENT_CLINIC_OR_DEPARTMENT_OTHER): Payer: Self-pay

## 2022-04-01 DIAGNOSIS — W01198A Fall on same level from slipping, tripping and stumbling with subsequent striking against other object, initial encounter: Secondary | ICD-10-CM | POA: Diagnosis not present

## 2022-04-01 DIAGNOSIS — Z8546 Personal history of malignant neoplasm of prostate: Secondary | ICD-10-CM | POA: Insufficient documentation

## 2022-04-01 DIAGNOSIS — R0781 Pleurodynia: Secondary | ICD-10-CM

## 2022-04-01 DIAGNOSIS — Y9301 Activity, walking, marching and hiking: Secondary | ICD-10-CM | POA: Diagnosis not present

## 2022-04-01 DIAGNOSIS — S20211A Contusion of right front wall of thorax, initial encounter: Secondary | ICD-10-CM | POA: Diagnosis not present

## 2022-04-01 DIAGNOSIS — Z043 Encounter for examination and observation following other accident: Secondary | ICD-10-CM | POA: Diagnosis not present

## 2022-04-01 DIAGNOSIS — W19XXXA Unspecified fall, initial encounter: Secondary | ICD-10-CM

## 2022-04-01 LAB — PROSTATE-SPECIFIC AG, SERUM (LABCORP): Prostate Specific Ag, Serum: 881 ng/mL — ABNORMAL HIGH (ref 0.0–4.0)

## 2022-04-01 NOTE — Telephone Encounter (Signed)
Spoke with pt's wife, message relayed per MD Benay Spice regarding pt's PSA. Pt's wife verbalized understanding and agrees with plan of care.   Pt's wife also verbalized that they were "just leaving the ED" and reports that pt fell last night during the night. Pt's wife reports that pt was evaluated at ED at [facility]. This RN verbalized that MD Benay Spice would be made aware.

## 2022-04-01 NOTE — ED Notes (Signed)
Discharge paperwork given and verbally understood. 

## 2022-04-01 NOTE — ED Triage Notes (Signed)
Patient here POV from   [place] at 0130 today when he awoke to use the BR and tripped into the [place]. Fell into a Table and injured his Right Torso.  No Head Injury. No LOC. No Anticoagulants.   NAD Noted during Triage. A&Ox4. GCS 15. BIB Wheelchair.

## 2022-04-01 NOTE — Telephone Encounter (Signed)
-----  Message from Ladell Pier, MD sent at 04/01/2022  1:54 PM EST ----- Please call wife, the PSA is higher, discontinue bicalutamide, follow-up as scheduled, Dr. Johny Shears office will call him to schedule an appointment for radiation to the lumbar spine

## 2022-04-01 NOTE — ED Provider Notes (Signed)
[facility] Provider Note   CSN: [id number] Arrival date & time: 04/01/22  1255     History  Chief Complaint  Patient presents with   [name] is a 89 y.o. male PMH of metastatic prostate cancer, ITP on weekly NPLATE (last platelet level 178 yesterday) and chronic prednisone and here for fall this morning at 0130.  He said that he was going to the bathroom at night and then when he was coming back he was trying to reach a glass of water on his bedside table and his right foot tripped over the walker that was sitting next to the bedside table.  He hit the right side of his chest onto the edge of the bedside table.  He feels very tender right there.  He was helped to his chair and that is where he slept all night due to pain of moving.  The only medications that he took last night were 25 mg of Toradol and Tylenol for his back pain which she went to the doctor about yesterday.  He denies hitting his head.  He denies loss of consciousness.  He does not feel like he has pain with deep breaths.  He does have positional pain.  Fall Pertinent negatives include no chest pain, no abdominal pain and no shortness of breath.      Home Medications Prior to Admission medications   Medication Sig Start Date End Date Taking? Authorizing Provider  acetaminophen (TYLENOL) 500 MG tablet Take 1,000 mg by mouth every 8 (eight) hours as needed for moderate pain.    [provider]  amoxicillin (AMOXIL) 500 MG tablet Take 4 capsules (2,000 mg) one hour prior to all dental visits. Patient not taking: Reported on 09/23/2021 07/03/20   Eileen Stanford, PA-C  atorvastatin (LIPITOR) 20 MG tablet TAKE 1 TABLET BY MOUTH DAILY AT 6 PM 05/24/21   Belva Crome, MD  bicalutamide (CASODEX) 50 MG tablet Take 1 tablet (50 mg total) by mouth daily. 02/10/22   Owens Shark, NP  Cholecalciferol 25 MCG (1000 UT) capsule Take 2,000 Units by mouth.     [provider]  ferrous sulfate 325 (65 FE) MG tablet Take 325 mg by mouth daily with breakfast.    [provider]  finasteride (PROSCAR) 5 MG tablet Take 5 mg by mouth daily. 11/14/21   [provider]  latanoprost (XALATAN) 0.005 % ophthalmic solution Place 1 drop into both eyes at bedtime.     [provider]  metoprolol tartrate (LOPRESSOR) 25 MG tablet TAKE 1/2 TABLET(12.5 MG) BY MOUTH DAILY 01/27/22   Belva Crome, MD  Multiple Vitamins-Minerals (PRESERVISION AREDS) CAPS 1 tablet 2 (two) times daily. 07/19/09   [provider]  nitroGLYCERIN (NITROSTAT) 0.4 MG SL tablet DISSOLVE 1 TABLET UNDER THE TONGUE EVERY 5 MINUTES AS NEEDED FOR CHEST PAIN FOR UP TO 3 DOSES, CALL 911 IF NO RELIEF AFTER 1 DOSE Patient not taking: Reported on 03/04/2022 02/27/22   Belva Crome, MD  pantoprazole (PROTONIX) 40 MG tablet Take 1 tablet (40 mg total) by mouth daily. 08/20/21   Belva Crome, MD  predniSONE (DELTASONE) 5 MG tablet Take 1 tablet (5 mg total) by mouth daily with breakfast. 02/03/22   Ladell Pier, MD  ranibizumab (LUCENTIS) 0.5 MG/0.05ML SOLN 0.5 mg by Intravitreal route every 6 (six) weeks. Left Eye ONLY 10/11/13   [provider]  romiPLOStim (NPLATE) 250  MCG injection Inject into the skin once a week.    [provider]  sertraline (ZOLOFT) 50 MG tablet Take 50 mg by mouth daily. 07/24/16   [provider]  tamsulosin (FLOMAX) 0.4 MG CAPS capsule Take 0.8 mg by mouth daily after supper.     [provider]  timolol (TIMOPTIC) 0.5 % ophthalmic solution Place 1 drop into the right eye daily. 03/07/21   [provider]  traMADol (ULTRAM) 50 MG tablet Take 0.5 tablets (25 mg total) by mouth every 8 (eight) hours as needed. 07/30/21   Owens Shark, NP      Allergies    Antazoline; Anesthetics, amide; Antihistamines, chlorpheniramine-type; Calcitonin (salmon); Diphenhydramine; Loratadine; and Sulfa  antibiotics    Review of Systems   Review of Systems  Constitutional:  Negative for chills and fever.  HENT:  Negative for ear pain and sore throat.   Eyes:  Negative for pain and visual disturbance.  Respiratory:  Negative for cough and shortness of breath.        Chest wall tenderness on right side with bruising  Cardiovascular:  Negative for chest pain and palpitations.  Gastrointestinal:  Negative for abdominal pain and vomiting.  Genitourinary:  Negative for dysuria and hematuria.  Musculoskeletal:  Negative for arthralgias and back pain.  Skin:  Negative for color change and rash.  Neurological:  Negative for seizures and syncope.  All other systems reviewed and are negative.   Physical Exam Updated Vital Signs BP 119/62 (BP Location: Right Arm)   Pulse 69   Temp 98.8 F (37.1 C) (Temporal)   Resp 18   Ht 5' 5"$  (1.651 m)   Wt 69.4 kg   SpO2 95%   BMI 25.46 kg/m  Physical Exam Vitals and nursing note reviewed.  Constitutional:      General: He is not in acute distress.    Appearance: He is well-developed.  HENT:     Head: Normocephalic and atraumatic.  Eyes:     Conjunctiva/sclera: Conjunctivae normal.  Cardiovascular:     Rate and Rhythm: Normal rate and regular rhythm.     Heart sounds: No murmur heard. Pulmonary:     Effort: Pulmonary effort is normal. No respiratory distress.     Breath sounds: Normal breath sounds.     Comments: Chest wall tenderness to palpation with bruising on right side Abdominal:     Palpations: Abdomen is soft.     Tenderness: There is no abdominal tenderness.  Musculoskeletal:        General: No swelling.     Cervical back: Neck supple.  Skin:    General: Skin is warm and dry.     Capillary Refill: Capillary refill takes less than 2 seconds.  Neurological:     General: No focal deficit present.     Mental Status: He is alert.     Cranial Nerves: No cranial nerve deficit.  Psychiatric:        Mood and Affect: Mood normal.      ED Results / Procedures / Treatments   Labs (all labs ordered are listed, but only abnormal results are displayed) Labs Reviewed - No data to display  EKG None  Radiology No results found.  Procedures Procedures   Medications Ordered in ED Medications - No data to display  ED Course/ Medical Decision Making/ A&P  Medical Decision Making  Medical Decision Making:   [name] is a 89 y.o. male who presented to the ED today with fall and right chest wall pain detailed above.     Complete initial physical exam performed, notably the patient  was tender to palpation on right chest wall with bruising, lungs CTAB.    Reviewed and confirmed nursing documentation for past medical history, family history, social history.    Initial Assessment:   With the patient's presentation of right chest wall pain, most likely diagnosis is bruise vs rib fracture. Other diagnoses were considered including (but not limited to) pneumothorax, ,hemothorax, ,mets. These are considered less likely due to history of present illness and physical exam findings.    Initial Plan:  CXR to evaluate for structural/infectious intrathoracic pathology.  Objective evaluation as below reviewed   Initial Study Results:   Radiology:  All images reviewed independently. Agree with radiology report at this time.   DG Chest 2 View  Result Date: 04/01/2022 CLINICAL DATA:  Fall. EXAM: CHEST - 2 VIEW COMPARISON:  September 16, 2019. FINDINGS: Mild cardiomegaly is noted. Status post transcatheter aortic valve repair. Rounded mass is seen in left perihilar region concerning for malignancy. No consolidative process is noted. Sclerotic densities are noted throughout the visualized skeleton concerning for osseous metastases. IMPRESSION: Left perihilar mass is noted concerning for malignancy. Sclerotic densities are noted throughout the skeleton concerning for osseous metastases. CT scan of the chest  is recommended for further evaluation. Electronically Signed   By: Marijo Conception M.D.   On: 04/01/2022 14:50     Final Assessment and Plan:   89 year old man here for right sided chest wall pain after a fall this morning.  Chest x-ray without issues of pneumothorax or hemothorax.  No rib fractures on chest x-ray.  Discussed that oncologist had recommended he can go up to 50 mg of tramadol if needed.  Recommended Tylenol as needed as well.  We discussed the mass on his chest x-ray which he has told me he has known about for 30 years.  He saw me he has gotten a bronchoscopy of this before which did not show malignant cells.  I discussed if wanting further workup he could get a CT scan outpatient.  He discussed this is likely not in his goals of care currently.  Patient discharged in stable condition with symptomatic measures discussed.  Believe this is most likely a contusion of his right side.  Clinical Impression:  1. Fall, initial encounter   2. Rib pain on right side      Discharge   Final Clinical Impression(s) / ED Diagnoses Final diagnoses:  None    Rx / DC Orders ED Discharge Orders     None         Gerrit Heck, MD 04/01/22 1544    Elnora Morrison, MD 04/11/22 2319

## 2022-04-01 NOTE — Discharge Instructions (Addendum)
Per your oncologist: "Mr. Stiefel can increase the tramadol dose to 50 mg as needed. He can also use Tylenol."  He also referred you to radiation oncology to consider palliative radiation to the lumbar spine  Your chest xray showed a mass that you know about- no rib fracture or air/blood in the lungs were seen. If you wanted to work this up more you can get a CT scan of your lungs outpatient  Do incentive spirometry to help make sure your lungs stay nice and open

## 2022-04-02 ENCOUNTER — Ambulatory Visit
Admission: RE | Admit: 2022-04-02 | Discharge: 2022-04-02 | Disposition: A | Discharge status: Home / Self Care | Payer: Medicare Other | Source: Ambulatory Visit | Attending: Radiation Oncology | Admitting: Radiation Oncology

## 2022-04-02 ENCOUNTER — Telehealth: Payer: Self-pay

## 2022-04-02 ENCOUNTER — Other Ambulatory Visit: Payer: Self-pay

## 2022-04-02 ENCOUNTER — Telehealth: Payer: Self-pay | Admitting: Radiation Oncology

## 2022-04-02 VITALS — BP 147/61 | HR 72 | Temp 98.1°F | Resp 18 | Ht 65.0 in | Wt 154.0 lb

## 2022-04-02 DIAGNOSIS — M810 Age-related osteoporosis without current pathological fracture: Secondary | ICD-10-CM | POA: Insufficient documentation

## 2022-04-02 DIAGNOSIS — C9112 Chronic lymphocytic leukemia of B-cell type in relapse: Secondary | ICD-10-CM | POA: Diagnosis not present

## 2022-04-02 DIAGNOSIS — Z7952 Long term (current) use of systemic steroids: Secondary | ICD-10-CM | POA: Insufficient documentation

## 2022-04-02 DIAGNOSIS — C61 Malignant neoplasm of prostate: Secondary | ICD-10-CM

## 2022-04-02 DIAGNOSIS — E785 Hyperlipidemia, unspecified: Secondary | ICD-10-CM | POA: Diagnosis not present

## 2022-04-02 DIAGNOSIS — Z85038 Personal history of other malignant neoplasm of large intestine: Secondary | ICD-10-CM | POA: Insufficient documentation

## 2022-04-02 DIAGNOSIS — Z8042 Family history of malignant neoplasm of prostate: Secondary | ICD-10-CM | POA: Diagnosis not present

## 2022-04-02 DIAGNOSIS — Z192 Hormone resistant malignancy status: Secondary | ICD-10-CM | POA: Diagnosis not present

## 2022-04-02 DIAGNOSIS — I2510 Atherosclerotic heart disease of native coronary artery without angina pectoris: Secondary | ICD-10-CM | POA: Diagnosis not present

## 2022-04-02 DIAGNOSIS — Z85828 Personal history of other malignant neoplasm of skin: Secondary | ICD-10-CM | POA: Insufficient documentation

## 2022-04-02 DIAGNOSIS — Z79899 Other long term (current) drug therapy: Secondary | ICD-10-CM | POA: Insufficient documentation

## 2022-04-02 DIAGNOSIS — C7951 Secondary malignant neoplasm of bone: Secondary | ICD-10-CM | POA: Insufficient documentation

## 2022-04-02 DIAGNOSIS — K219 Gastro-esophageal reflux disease without esophagitis: Secondary | ICD-10-CM | POA: Insufficient documentation

## 2022-04-02 DIAGNOSIS — R011 Cardiac murmur, unspecified: Secondary | ICD-10-CM | POA: Insufficient documentation

## 2022-04-02 DIAGNOSIS — R918 Other nonspecific abnormal finding of lung field: Secondary | ICD-10-CM | POA: Diagnosis not present

## 2022-04-02 DIAGNOSIS — Z87891 Personal history of nicotine dependence: Secondary | ICD-10-CM | POA: Insufficient documentation

## 2022-04-02 NOTE — Telephone Encounter (Signed)
RN called patient to see if available to come in for consultation this afternoon left voicemail to call back.  Will try again.  Informed medical secretaries of need.

## 2022-04-02 NOTE — Progress Notes (Signed)
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    ICD-10-CM   1. Prostate cancer metastatic to bone [facility])  C61    C79.51       HISTORY OF PRESENT ILLNESS: [name] is a 89 y.o. male seen at the request of Dr. Benay Spice.  He is an established patient of Dr. Benay Spice, followed for chronic thrombocytopenia.  His past medical history is significant for hairy cell leukemia status post splenectomy in 1982, colon cancer in 1984, liposarcoma of the chest wall in 2013 and basal cell carcinoma of the cheek.  He had previously been followed at [facility] but transferred his care to Dr. Benay Spice in 2021 for continued management of the chronic thrombocytopenia.  He has chronic low back pain and does have a history of BPH with elevated PSA, previously followed with Dr Darcus Austin at [facility] and more recently, by Dr. Junious Silk since July 2021.he has been managed with finasteride and Flomax for many years and surveillance CT scans showing no evidence of bony or visceral disease.  He had a recent screening virtual CT colonoscopy on 06/25/2021 and was noted to have sclerotic lesions throughout the visualized bones as well as enlarged prostate.  A PSA level was checked on 07/01/2021 and was significantly elevated at 303 ,most consistent with metastatic prostate cancer.  He was started on Lupron every 3 months and bicalutamide on 08/02/2021.  A follow-up PSA on 09/16/2021 had decreased at 246 but unfortunately, has been gradually increasing despite treatment since that time.  The PSA had increased to 363 on 10/21/2021 so Zytiga and prednisone were added to his  medical regimen on 11/05/2021.  Follow-up PSA on 12/16/2021 was further elevated at 457 with castrate level testosterone.  He complained of increasing back pain which was managed with tramadol as needed.  PSA was further elevated at 531 on 01/13/2022 so the Zytiga prednisone was discontinued at that time.  However, when he discontinued the prednisone he noticed significant increase in his back pain so he resumed the prednisone only.  Guardant360 was sent but unfortunately showed no actionable mutations, therefore he is not a candidate for PARP or immunotherapy.  He is also not felt to be a candidate for docetaxel chemotherapy.  He resumed Casodex in addition to the Lupron on 02/10/2022 but subsequent PSAs have continued to rise, up to 881 on his most recent labs 03/31/2022.  His back pain has continued to progress and is no longer well-controlled with tramadol and prednisone.  Therefore, he has kindly been referred to [place] today to discuss the potential role for radiotherapy in the management of painful bony metastatic disease.  PREVIOUS RADIATION THERAPY: No  PAST MEDICAL HISTORY:  Past Medical History:  Diagnosis Date   Acute appendicitis    Anemia    Anxiety    Arthritis    "back" (03/14/2014)   Blood dyscrasia    hairy cell leukemia   CAD (coronary artery disease)    a. 03/14/14  s/p overlapping DES x2 to mid-distal RCA.   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    Colon cancer ([place]) 1984   Compression fracture of lumbar spine,  non-traumatic (HCC)    DJD (degenerative joint disease) of lumbar spine    Dyslipidemia    Dyspnea    Elevated PSA    GERD (gastroesophageal reflux disease)    Glaucoma    Hairy cell leukemia ([place]) dx'd 1980   Heart murmur    History of blood transfusion    "several; related to hairy cell leukemia & tx "   History of stomach ulcers 1968   Hypertension    Leukemia, hairy cell ([place])    Malnutrition ([place])    Osteoarthritis    Osteoporosis     Pneumonia    S/P TAVR (transcatheter aortic valve replacement) 09/06/2019   s/p TAVR with a 26 mm Edwards S3U via the TF approach by Dr. Angelena Form and Cyndia Bent.    Severe aortic stenosis    s/p tavr   Skin cancer of face       PAST SURGICAL HISTORY: Past Surgical History:  Procedure Laterality Date   APPENDECTOMY  10/2007   BUBBLE STUDY  11/02/2019   Procedure: BUBBLE STUDY;  Surgeon: Elouise Munroe, MD;  Location: [place];  Service: Cardiology;;   CATARACT EXTRACTION, BILATERAL Bilateral 02/2008   COLON SURGERY  1984   "sigmoid; open"   CORONARY ANGIOPLASTY WITH STENT PLACEMENT  03/14/2014   "2"   EYE SURGERY Bilateral    cataract   FRACTURE SURGERY     LEFT HEART CATHETERIZATION WITH CORONARY ANGIOGRAM N/A 03/14/2014   Procedure: LEFT HEART CATHETERIZATION WITH CORONARY ANGIOGRAM;  Surgeon: Peter M Martinique, MD;  Location: [facility] CATH LAB;  Service: Cardiovascular;  Laterality: N/A;   LIPOMA EXCISION Right 07/2011   liposarcoma resection; "back"   MOHS SURGERY Left 02/2011   MOHS SURGERY  X 3   "all on my face"   MOLE REMOVAL Left 1985   cheek   ORIF ANKLE FRACTURE Left 2000   RIGHT/LEFT HEART CATH AND CORONARY ANGIOGRAPHY N/A 07/26/2019   Procedure: RIGHT/LEFT HEART CATH AND CORONARY ANGIOGRAPHY;  Surgeon: Belva Crome, MD;  Location: [place] CV LAB;  Service: Cardiovascular;  Laterality: N/A;   SHOULDER SURGERY  04/2004   SPLENECTOMY  1992   TEE WITHOUT CARDIOVERSION N/A 09/06/2019   Procedure: TRANSESOPHAGEAL ECHOCARDIOGRAM (TEE);  Surgeon: Burnell Blanks, MD;  Location: [place] CV LAB;  Service: Open Heart Surgery;  Laterality: N/A;   TEE WITHOUT CARDIOVERSION N/A 11/02/2019   Procedure: TRANSESOPHAGEAL ECHOCARDIOGRAM (TEE);  Surgeon: Elouise Munroe, MD;  Location: [place];  Service: Cardiology;  Laterality: N/A;   TONSILLECTOMY AND ADENOIDECTOMY  1939   TRANSCATHETER AORTIC VALVE REPLACEMENT, TRANSFEMORAL N/A 09/06/2019   Procedure: TRANSCATHETER AORTIC  VALVE REPLACEMENT, TRANSFEMORAL;  Surgeon: Burnell Blanks, MD;  Location: [place] CV LAB;  Service: Open Heart Surgery;  Laterality: N/A;    FAMILY HISTORY:  Family History  Problem Relation Age of Onset   Congestive Heart Failure Father 56   Stroke Mother    Diabetes Mellitus II Brother    Prostate cancer Brother    Heart Problems Brother        CABG    SOCIAL HISTORY:  Social History   Socioeconomic History   Marital status: Married    Spouse name: Not on file   Number of children: Not on file   Years of education: 16   Highest education level: Bachelor's degree (e.g., BA, AB, BS)  Occupational History   Occupation: Retired  Tobacco Use   Smoking status: Former    Packs/day: 0.25  Years: 1.00    Total pack years: 0.25    Types: Cigarettes, Cigars   Smokeless tobacco: Never   Tobacco comments:    occasional social smoker during college.  Vaping Use   Vaping Use: Never used  Substance and Sexual Activity   Alcohol use: Yes    Alcohol/week: 9.0 standard drinks of alcohol    Types: 2 Glasses of wine, 2 Shots of liquor, 5 Standard drinks or equivalent per week    Comment: socially   Drug use: No   Sexual activity: Not Currently  Other Topics Concern   Not on file  Social History Narrative   Not on file   Social Determinants of Health   Financial Resource Strain: Not on file  Food Insecurity: No Food Insecurity (03/21/2020)   Hunger Vital Sign    Worried About Running Out of Food in the Last Year: Never true    Ran Out of Food in the Last Year: Never true  Transportation Needs: No Transportation Needs (11/21/2021)   PRAPARE - Hydrologist (Medical): No    Lack of Transportation (Non-Medical): No  Physical Activity: Not on file  Stress: Not on file  Social Connections: Not on file  Intimate Partner Violence: Not on file    ALLERGIES: Antazoline; Anesthetics, amide; Antihistamines, chlorpheniramine-type; Calcitonin  (salmon); Diphenhydramine; Loratadine; and Sulfa antibiotics  MEDICATIONS:  Current Outpatient Medications  Medication Sig Dispense Refill   acetaminophen (TYLENOL) 500 MG tablet Take 1,000 mg by mouth every 8 (eight) hours as needed for moderate pain.     amoxicillin (AMOXIL) 500 MG tablet Take 4 capsules (2,000 mg) one hour prior to all dental visits. (Patient not taking: Reported on 09/23/2021) 8 tablet 11   atorvastatin (LIPITOR) 20 MG tablet TAKE 1 TABLET BY MOUTH DAILY AT 6 PM 90 tablet 3   bicalutamide (CASODEX) 50 MG tablet Take 1 tablet (50 mg total) by mouth daily. 30 tablet 1   Cholecalciferol 25 MCG (1000 UT) capsule Take 2,000 Units by mouth.     ferrous sulfate 325 (65 FE) MG tablet Take 325 mg by mouth daily with breakfast.     finasteride (PROSCAR) 5 MG tablet Take 5 mg by mouth daily.     latanoprost (XALATAN) 0.005 % ophthalmic solution Place 1 drop into both eyes at bedtime.      metoprolol tartrate (LOPRESSOR) 25 MG tablet TAKE 1/2 TABLET(12.5 MG) BY MOUTH DAILY 45 tablet 3   Multiple Vitamins-Minerals (PRESERVISION AREDS) CAPS 1 tablet 2 (two) times daily.     nitroGLYCERIN (NITROSTAT) 0.4 MG SL tablet DISSOLVE 1 TABLET UNDER THE TONGUE EVERY 5 MINUTES AS NEEDED FOR CHEST PAIN FOR UP TO 3 DOSES, CALL 911 IF NO RELIEF AFTER 1 DOSE (Patient not taking: Reported on 03/04/2022) 25 tablet 2   pantoprazole (PROTONIX) 40 MG tablet Take 1 tablet (40 mg total) by mouth daily. 90 tablet 3   predniSONE (DELTASONE) 5 MG tablet Take 1 tablet (5 mg total) by mouth daily with breakfast. 30 tablet 2   ranibizumab (LUCENTIS) 0.5 MG/0.05ML SOLN 0.5 mg by Intravitreal route every 6 (six) weeks. Left Eye ONLY     romiPLOStim (NPLATE) 250 MCG injection Inject into the skin once a week.     sertraline (ZOLOFT) 50 MG tablet Take 50 mg by mouth daily.     tamsulosin (FLOMAX) 0.4 MG CAPS capsule Take 0.8 mg by mouth daily after supper.      timolol (TIMOPTIC) 0.5 % ophthalmic solution  Place 1 drop  into the right eye daily.     traMADol (ULTRAM) 50 MG tablet Take 0.5 tablets (25 mg total) by mouth every 8 (eight) hours as needed. 30 tablet 0   No current facility-administered medications for this visit.    REVIEW OF SYSTEMS:  On review of systems, the patient reports that he is doing fair in general.  Unfortunately, he took a fall at home yesterday when his foot got tangled up in his walker on return from a trip to the bathroom.  He was evaluated in the emergency department due to left-sided chest wall/rib pain but chest x-ray did not show any evidence of fracture, hemothorax or pneumothorax.  He denies any shortness of breath, cough, fevers, chills, night sweats, or recent unintended weight changes.  He denies any bowel or bladder disturbances, and denies abdominal pain, nausea or vomiting.  He denies any focal weakness or paresthesias in the lower extremities.  Aside from the low back pain, he denies any new musculoskeletal or joint aches or pains. A complete review of systems is obtained and is otherwise negative.    PHYSICAL EXAM:  Wt Readings from Last 3 Encounters:  04/01/22 153 lb (69.4 kg)  03/31/22 153 lb (69.4 kg)  03/24/22 154 lb 4 oz (70 kg)   Temp Readings from Last 3 Encounters:  04/01/22 98.8 F (37.1 C) (Temporal)  03/31/22 98.2 F (36.8 C) (Oral)  03/24/22 98 F (36.7 C) (Oral)   BP Readings from Last 3 Encounters:  04/01/22 132/76  03/31/22 (!) 155/58  03/24/22 (!) 133/59   Pulse Readings from Last 3 Encounters:  04/01/22 68  03/31/22 75  03/24/22 61    /10  In general this is a well appearing Caucasian man in no acute distress.  He's alert and oriented x4 and appropriate throughout the examination. Cardiopulmonary assessment is negative for acute distress and he exhibits normal effort.   KPS = 80  100 - Normal; no complaints; no evidence of disease. 90   - Able to carry on normal activity; minor signs or symptoms of disease. 80   - Normal activity  with effort; some signs or symptoms of disease. 59   - Cares for self; unable to carry on normal activity or to do active work. 60   - Requires occasional assistance, but is able to care for most of his personal needs. 50   - Requires considerable assistance and frequent medical care. 56   - Disabled; requires special care and assistance. 22   - Severely disabled; hospital admission is indicated although death not imminent. 35   - Very sick; hospital admission necessary; active supportive treatment necessary. 10   - Moribund; fatal processes progressing rapidly. 0     - Dead  Karnofsky DA, Abelmann Story, Craver LS and Burchenal [facility] [phone]) The use of the nitrogen mustards in the palliative treatment of carcinoma: with particular reference to bronchogenic carcinoma Cancer 1 634-56  LABORATORY DATA:  Lab Results  Component Value Date   WBC 12.2 (H) 03/31/2022   HGB 8.6 (L) 03/31/2022   HCT 27.4 (L) 03/31/2022   MCV 81.8 03/31/2022   PLT 178 03/31/2022   Lab Results  Component Value Date   NA 139 03/31/2022   K 4.2 03/31/2022   CL 106 03/31/2022   CO2 23 03/31/2022   Lab Results  Component Value Date   ALT 12 03/31/2022   AST 19 03/31/2022   ALKPHOS 1,062 (H) 03/31/2022   BILITOT 0.5  03/31/2022     RADIOGRAPHY: DG Chest 2 View  Result Date: 04/01/2022 CLINICAL DATA:  Fall. EXAM: CHEST - 2 VIEW COMPARISON:  September 16, 2019. FINDINGS: Mild cardiomegaly is noted. Status post transcatheter aortic valve repair. Rounded mass is seen in left perihilar region concerning for malignancy. No consolidative process is noted. Sclerotic densities are noted throughout the visualized skeleton concerning for osseous metastases. IMPRESSION: Left perihilar mass is noted concerning for malignancy. Sclerotic densities are noted throughout the skeleton concerning for osseous metastases. CT scan of the chest is recommended for further evaluation. Electronically Signed   By: Marijo Conception M.D.   On: 04/01/2022  14:50      IMPRESSION/PLAN: 1. 89 y.o. 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 Today, we talked to the patient and his wife, [name], about the findings and workup thus far. We discussed the natural history of metastatic prostate cancer and general treatment, highlighting the role of radiotherapy in the management of painful osseous disease. We discussed the available radiation techniques, and focused on the details and logistics of delivery.  The recommendation is for a 2-week course of daily, palliative radiotherapy to the painful site of disease in the lumbar spine at L4.  We reviewed the anticipated acute and late sequelae associated with radiation in this setting. The patient and his wife were encouraged to ask questions that were answered to their stated satisfaction.  At the conclusion of our conversation, the patient wishes to proceed with the recommended 2-week course of daily, palliative radiotherapy to the painful site of disease in the lumbar spine at L4.  He has freely signed written consent to proceed today in the office and a copy of this document will be placed in his medical record.  We will share our discussion with Dr. Benay Spice and proceed with treatment planning accordingly.  He is scheduled for CT SIM/treatment planning following our visit today, in anticipation of beginning his daily treatments on Thursday, 04/03/2022.  We enjoyed meeting him and his wife, [name], today and look forward to continuing to participate in his care.  We personally spent 60 minutes in this encounter including chart review, reviewing radiological studies, meeting face-to-face with the patient, entering orders and completing documentation.    Nicholos Johns, PA-C    Tyler Pita, MD  [facility] Direct Dial: [phone]  Fax: [phone] [email or web address]  Skype  LinkedIn

## 2022-04-02 NOTE — Progress Notes (Signed)
  Radiation Oncology         [phone] ________________________________  Name: [name] MRN: [id number]  Date: 04/02/2022  DOB: 10/09/1923  SIMULATION AND TREATMENT PLANNING NOTE    ICD-10-CM   1. Prostate cancer metastatic to bone [facility])  C61    C79.51       DIAGNOSIS:  89 y.o. patient with painful lumbar metastasis  NARRATIVE:  The patient was brought to the [facility].  Identity was confirmed.  All relevant records and images related to the planned course of therapy were reviewed.  The patient freely provided informed written consent to proceed with treatment after reviewing the details related to the planned course of therapy. The consent form was witnessed and verified by the simulation staff.  Then, the patient was set-up in a stable reproducible  supine position for radiation therapy.  CT images were obtained.  Surface markings were placed.  The CT images were loaded into the planning software.  Then the target and avoidance structures were contoured including kidneys.  Treatment planning then occurred.  The radiation prescription was entered and confirmed.  Then, I designed and supervised the construction of a total of 3 medically necessary complex treatment devices with VacLoc positioner and 2 MLCs to shield kidneys.  I have requested : 3D Simulation  I have requested a DVH of the following structures: Left Kidney, Right Kidney and target.  PLAN:  The patient will receive 30 Gy in 10 fractions.  ________________________________  Sheral Apley Tammi Klippel, M.D.

## 2022-04-02 NOTE — Progress Notes (Signed)
Histology and Location of Primary Cancer: Metastatic Prostate Ca with mets to bone  PSA:  881 on 03/31/2022 and 749 on 03/17/2022.  Sites of Visceral and Bony Metastatic Disease: Lower Lumbar spine (L4)  Location(s) of Symptomatic Metastases: Lower Lumbar spine (L4)  DG Chest 2 View 04/01/2022 Dr. Elnora Morrison CLINICAL DATA:  Fall  FINDINGS:  Mild cardiomegaly is noted. Status post transcatheter aortic valve repair. Rounded mass is seen in left perihilar region concerning for malignancy. No consolidative process is noted. Sclerotic densities are noted throughout the visualized skeleton concerning for osseous metastases.   IMPRESSION:  Left perihilar mass is noted concerning for malignancy. Sclerotic densities are noted throughout the skeleton concerning for osseous metastases. CT scan of the chest is recommended for further evaluation  09/02/2019 Dr. Lauree Chandler DG Chest 2 View CLINICAL DATA:  Preop evaluation for upcoming aortic surgery   FINDINGS: Cardiac shadow is mildly enlarged. Stable left upper lobe lung mass is noted. No focal infiltrate is seen. Degenerative changes of the thoracic spine are noted.   IMPRESSION: Stable left upper lobe mass.  No new focal abnormality is noted.   08/12/2019 Dr. Sherren Mocha DG Chest 2 View CLINICAL DATA:  Preoperative evaluation for aortic valve replacement   FINDINGS: Pulmonary nodular lesion in the left mid lung again noted measuring 3.8 x 3.4 cm. Lungs elsewhere clear. Heart size and pulmonary vascularity are normal. No adenopathy. There is degenerative change in the thoracic spine. There are surgical clips in the right axilla left upper abdomen.   IMPRESSION: Nodular opacity again noted left mid lung measuring 3.8 x 3.4 cm.  Recommendation for PET-CT examination remains in effect. Lungs elsewhere clear. Cardiac silhouette normal. No adenopathy.  Past/Anticipated chemotherapy by medical oncology, if any:   Dr.  Benay Spice Disposition: [name] has metastatic prostate cancer, currently treated with prednisone and bicalutamide.  He has increased pain.  We will follow-up on the PSA from today.  Bicalutamide will be discontinued if the PSA is higher.   [name] will increase the tramadol dose to 50 mg as needed.  He will also use Tylenol.  I will refer him to radiation oncology to consider palliative radiation to the lumbar spine.  The area of discomfort appears to be localized to the lower lumbar spine.   [name] is not a candidate for chemotherapy.  We can consider radium 223 or Pluvicto if palliative radiation does not help.   He will return for an office visit in 2 weeks.  He continues weekly Nplate for treatment of chronic thrombocytopenia.  Pain on a scale of 0-10 is: 5/10 lower back radiates to bilateral knees and thighs. Took pain medication prior to visit.   If Spine Met(s), symptoms, if any, include: Bowel/Bladder retention or incontinence (please describe): Bowel constipation, but regular per patient and wife. She encourages drink more water. Numbness or weakness in extremities (please describe): Yes bilateral feet from time to time. Current Decadron regimen, if applicable: No  Ambulatory status? Walker? Wheelchair?:  Has walker and cane.  SAFETY ISSUES: Prior radiation? No Pacemaker/ICD? No Possible current pregnancy? Male Is the patient on methotrexate? No  Current Complaints / other details:

## 2022-04-02 NOTE — Telephone Encounter (Signed)
Scheduled consult per Dr. Johny Shears nurse's request. Patient's wife is aware.

## 2022-04-03 ENCOUNTER — Other Ambulatory Visit: Payer: Self-pay

## 2022-04-03 ENCOUNTER — Ambulatory Visit
Admission: RE | Admit: 2022-04-03 | Discharge: 2022-04-03 | Disposition: A | Discharge status: Home / Self Care | Payer: Medicare Other | Source: Ambulatory Visit | Attending: Radiation Oncology | Admitting: Radiation Oncology

## 2022-04-03 ENCOUNTER — Other Ambulatory Visit: Payer: Self-pay | Admitting: Nurse Practitioner

## 2022-04-03 DIAGNOSIS — Z192 Hormone resistant malignancy status: Secondary | ICD-10-CM | POA: Diagnosis not present

## 2022-04-03 DIAGNOSIS — C7951 Secondary malignant neoplasm of bone: Secondary | ICD-10-CM | POA: Diagnosis not present

## 2022-04-03 DIAGNOSIS — C61 Malignant neoplasm of prostate: Secondary | ICD-10-CM | POA: Insufficient documentation

## 2022-04-03 DIAGNOSIS — Z510 Encounter for antineoplastic radiation therapy: Secondary | ICD-10-CM | POA: Diagnosis not present

## 2022-04-03 LAB — RAD ONC ARIA SESSION SUMMARY
Course Elapsed Days: 0
Plan Fractions Treated to Date: 1
Plan Prescribed Dose Per Fraction: 3 Gy
Plan Total Fractions Prescribed: 10
Plan Total Prescribed Dose: 30 Gy
Reference Point Dosage Given to Date: 3 Gy
Reference Point Session Dosage Given: 3 Gy
Session Number: 1

## 2022-04-04 ENCOUNTER — Ambulatory Visit
Admission: RE | Admit: 2022-04-04 | Discharge: 2022-04-04 | Disposition: A | Discharge status: Home / Self Care | Payer: Medicare Other | Source: Ambulatory Visit | Attending: Radiation Oncology | Admitting: Radiation Oncology

## 2022-04-04 ENCOUNTER — Other Ambulatory Visit: Payer: Self-pay | Admitting: Urology

## 2022-04-04 ENCOUNTER — Other Ambulatory Visit: Payer: Self-pay

## 2022-04-04 DIAGNOSIS — C61 Malignant neoplasm of prostate: Secondary | ICD-10-CM | POA: Diagnosis not present

## 2022-04-04 DIAGNOSIS — Z510 Encounter for antineoplastic radiation therapy: Secondary | ICD-10-CM | POA: Diagnosis not present

## 2022-04-04 DIAGNOSIS — Z192 Hormone resistant malignancy status: Secondary | ICD-10-CM | POA: Diagnosis not present

## 2022-04-04 DIAGNOSIS — C7951 Secondary malignant neoplasm of bone: Secondary | ICD-10-CM | POA: Diagnosis not present

## 2022-04-04 LAB — RAD ONC ARIA SESSION SUMMARY
Course Elapsed Days: 1
Plan Fractions Treated to Date: 2
Plan Prescribed Dose Per Fraction: 3 Gy
Plan Total Fractions Prescribed: 10
Plan Total Prescribed Dose: 30 Gy
Reference Point Dosage Given to Date: 6 Gy
Reference Point Session Dosage Given: 3 Gy
Session Number: 2

## 2022-04-04 MED ORDER — ONDANSETRON HCL 8 MG PO TABS: 8.0000 mg | ORAL_TABLET | Freq: Three times a day (TID) | ORAL | 0 refills | Status: AC | PRN

## 2022-04-07 ENCOUNTER — Other Ambulatory Visit: Payer: Self-pay

## 2022-04-07 ENCOUNTER — Inpatient Hospital Stay: Payer: Medicare Other

## 2022-04-07 ENCOUNTER — Ambulatory Visit
Admission: RE | Admit: 2022-04-07 | Discharge: 2022-04-07 | Disposition: A | Discharge status: Home / Self Care | Payer: Medicare Other | Source: Ambulatory Visit | Attending: Radiation Oncology | Admitting: Radiation Oncology

## 2022-04-07 ENCOUNTER — Other Ambulatory Visit: Payer: Self-pay | Admitting: *Deleted

## 2022-04-07 VITALS — BP 139/66 | HR 58 | Temp 97.8°F | Resp 20

## 2022-04-07 DIAGNOSIS — R197 Diarrhea, unspecified: Secondary | ICD-10-CM | POA: Insufficient documentation

## 2022-04-07 DIAGNOSIS — R112 Nausea with vomiting, unspecified: Secondary | ICD-10-CM | POA: Insufficient documentation

## 2022-04-07 DIAGNOSIS — D696 Thrombocytopenia, unspecified: Secondary | ICD-10-CM | POA: Insufficient documentation

## 2022-04-07 DIAGNOSIS — C7951 Secondary malignant neoplasm of bone: Secondary | ICD-10-CM | POA: Insufficient documentation

## 2022-04-07 DIAGNOSIS — D693 Immune thrombocytopenic purpura: Secondary | ICD-10-CM

## 2022-04-07 DIAGNOSIS — Z510 Encounter for antineoplastic radiation therapy: Secondary | ICD-10-CM | POA: Diagnosis not present

## 2022-04-07 DIAGNOSIS — Z5111 Encounter for antineoplastic chemotherapy: Secondary | ICD-10-CM | POA: Insufficient documentation

## 2022-04-07 DIAGNOSIS — C61 Malignant neoplasm of prostate: Secondary | ICD-10-CM | POA: Diagnosis not present

## 2022-04-07 DIAGNOSIS — Z192 Hormone resistant malignancy status: Secondary | ICD-10-CM | POA: Diagnosis not present

## 2022-04-07 LAB — RAD ONC ARIA SESSION SUMMARY
Course Elapsed Days: 4
Plan Fractions Treated to Date: 3
Plan Prescribed Dose Per Fraction: 3 Gy
Plan Total Fractions Prescribed: 10
Plan Total Prescribed Dose: 30 Gy
Reference Point Dosage Given to Date: 9 Gy
Reference Point Session Dosage Given: 3 Gy
Session Number: 3

## 2022-04-07 LAB — CBC WITH DIFFERENTIAL (CANCER CENTER ONLY)
Abs Immature Granulocytes: 0.3 10*3/uL — ABNORMAL HIGH (ref 0.00–0.07)
Basophils Absolute: 0 10*3/uL (ref 0.0–0.1)
Basophils Relative: 0 %
Eosinophils Absolute: 0.1 10*3/uL (ref 0.0–0.5)
Eosinophils Relative: 1 %
HCT: 25.9 % — ABNORMAL LOW (ref 39.0–52.0)
Hemoglobin: 8.2 g/dL — ABNORMAL LOW (ref 13.0–17.0)
Lymphocytes Relative: 3 %
Lymphs Abs: 0.4 10*3/uL — ABNORMAL LOW (ref 0.7–4.0)
MCH: 25.8 pg — ABNORMAL LOW (ref 26.0–34.0)
MCHC: 31.7 g/dL (ref 30.0–36.0)
MCV: 81.4 fL (ref 80.0–100.0)
Monocytes Absolute: 2.7 10*3/uL — ABNORMAL HIGH (ref 0.1–1.0)
Monocytes Relative: 21 %
Myelocytes: 2 %
Neutro Abs: 9.4 10*3/uL — ABNORMAL HIGH (ref 1.7–7.7)
Neutrophils Relative %: 73 %
Platelet Count: 76 10*3/uL — ABNORMAL LOW (ref 150–400)
RBC: 3.18 MIL/uL — ABNORMAL LOW (ref 4.22–5.81)
RDW: 20.7 % — ABNORMAL HIGH (ref 11.5–15.5)
WBC Count: 12.9 10*3/uL — ABNORMAL HIGH (ref 4.0–10.5)
nRBC: 1 % — ABNORMAL HIGH (ref 0.0–0.2)

## 2022-04-07 MED ORDER — ROMIPLOSTIM 125 MCG ~~LOC~~ SOLR
1.0000 ug/kg | Freq: Once | SUBCUTANEOUS | Status: AC
  Administered 2022-04-07: 70 ug via SUBCUTANEOUS
  Filled 2022-04-07: qty 0.14

## 2022-04-07 MED ORDER — LEUPROLIDE ACETATE (3 MONTH) 22.5 MG ~~LOC~~ KIT
22.5000 mg | PACK | Freq: Once | SUBCUTANEOUS | Status: AC
  Administered 2022-04-07: 22.5 mg via SUBCUTANEOUS
  Filled 2022-04-07: qty 22.5

## 2022-04-07 NOTE — Patient Instructions (Signed)
Romiplostim Injection What is this medication? ROMIPLOSTIM (roe mi PLOE stim) treats low levels of platelets in your body caused by immune thrombocytopenia (ITP). It is prescribed when other medications have not worked or cannot be tolerated. It may also be used to help people who have been exposed to high doses of radiation. It works by increasing the amount of platelets in your blood. This lowers the risk of bleeding. This medicine may be used for other purposes; ask your health care provider or pharmacist if you have questions. COMMON BRAND NAME(S): Nplate What should I tell my care team before I take this medication? They need to know if you have any of these conditions: Blood clots Myelodysplastic syndrome An unusual or allergic reaction to romiplostim, mannitol, other medications, foods, dyes, or preservatives Pregnant or trying to get pregnant Breast-feeding How should I use this medication? This medication is injected under the skin. It is given by a care team in a hospital or clinic setting. A special MedGuide will be given to you before each treatment. Be sure to read this information carefully each time. Talk to your care team about the use of this medication in children. While it may be prescribed for children as young as newborns for selected conditions, precautions do apply. Overdosage: If you think you have taken too much of this medicine contact a poison control center or emergency room at once. NOTE: This medicine is only for you. Do not share this medicine with others. What if I miss a dose? Keep appointments for follow-up doses. It is important not to miss your dose. Call your care team if you are unable to keep an appointment. What may interact with this medication? Interactions are not expected. This list may not describe all possible interactions. Give your health care provider a list of all the medicines, herbs, non-prescription drugs, or dietary supplements you use. Also  tell them if you smoke, drink alcohol, or use illegal drugs. Some items may interact with your medicine. What should I watch for while using this medication? Visit your care team for regular checks on your progress. You may need blood work done while you are taking this medication. Your condition will be monitored carefully while you are receiving this medication. It is important not to miss any appointments. What side effects may I notice from receiving this medication? Side effects that you should report to your care team as soon as possible: Allergic reactions--skin rash, itching, hives, swelling of the face, lips, tongue, or throat Blood clot--pain, swelling, or warmth in the leg, shortness of breath, chest pain Side effects that usually do not require medical attention (report to your care team if they continue or are bothersome): Dizziness Joint pain Muscle pain Pain in the hands or feet Stomach pain Trouble sleeping This list may not describe all possible side effects. Call your doctor for medical advice about side effects. You may report side effects to FDA at 1-800-FDA-1088. Where should I keep my medication? This medication is given in a hospital or clinic. It will not be stored at home. NOTE: This sheet is a summary. It may not cover all possible information. If you have questions about this medicine, talk to your doctor, pharmacist, or health care provider.  2023 Elsevier/Gold Standard (2021-05-28 00:00:00)   Leuprolide Suspension for Injection (Prostate Cancer) What is this medication? LEUPROLIDE (loo PROE lide) reduces the symptoms of prostate cancer. It works by decreasing levels of the hormone testosterone in the body. This prevents prostate cancer  cells from spreading or growing. This medicine may be used for other purposes; ask your health care provider or pharmacist if you have questions. COMMON BRAND NAME(S): Eligard, Lupron Depot, Lupron Depot-Ped, Lutrate Depot,  Viadur What should I tell my care team before I take this medication? They need to know if you have any of these conditions: Diabetes Heart disease Heart failure High or low levels of electrolytes, such as magnesium, potassium, or sodium in your blood Irregular heartbeat or rhythm Seizures An unusual or allergic reaction to leuprolide, other medications, foods, dyes, or preservatives Pregnant or trying to get pregnant Breast-feeding How should I use this medication? This medication is injected under the skin or into a muscle. It is given by your care team in a hospital or clinic setting. Talk to your care team about the use of this medication in children. Special care may be needed. Overdosage: If you think you have taken too much of this medicine contact a poison control center or emergency room at once. NOTE: This medicine is only for you. Do not share this medicine with others. What if I miss a dose? Keep appointments for follow-up doses. It is important not to miss your dose. Call your care team if you are unable to keep an appointment. What may interact with this medication? Do not take this medication with any of the following: Cisapride Dronedarone Ketoconazole Levoketoconazole Pimozide Thioridazine This medication may also interact with the following: Other medications that cause heart rhythm changes This list may not describe all possible interactions. Give your health care provider a list of all the medicines, herbs, non-prescription drugs, or dietary supplements you use. Also tell them if you smoke, drink alcohol, or use illegal drugs. Some items may interact with your medicine. What should I watch for while using this medication? Visit your care team for regular checks on your progress. Tell your care team if your symptoms do not start to get better or if they get worse. This medication may increase blood sugar. The risk may be higher in patients who already have diabetes.  Ask your care team what you can do to lower the risk of diabetes while taking this medication. This medication may cause infertility. Talk to your care team if you are concerned about your fertility. Heart attacks and strokes have been reported with the use of this medication. Get emergency help if you develop signs or symptoms of a heart attack or stroke. Talk to your care team about the risks and benefits of this medication. What side effects may I notice from receiving this medication? Side effects that you should report to your care team as soon as possible: Allergic reactions--skin rash, itching, hives, swelling of the face, lips, tongue, or throat Heart attack--pain or tightness in the chest, shoulders, arms, or jaw, nausea, shortness of breath, cold or clammy skin, feeling faint or lightheaded Heart rhythm changes--fast or irregular heartbeat, dizziness, feeling faint or lightheaded, chest pain, trouble breathing High blood sugar (hyperglycemia)--increased thirst or amount of urine, unusual weakness or fatigue, blurry vision Mood swings, irritability, hostility Seizures Stroke--sudden numbness or weakness of the face, arm, or leg, trouble speaking, confusion, trouble walking, loss of balance or coordination, dizziness, severe headache, change in vision Thoughts of suicide or self-harm, worsening mood, feelings of depression Side effects that usually do not require medical attention (report to your care team if they continue or are bothersome): Bone pain Change in sex drive or performance General discomfort and fatigue Hot flashes Muscle pain  Pain, redness, or irritation at injection site Swelling of the ankles, hands, or feet This list may not describe all possible side effects. Call your doctor for medical advice about side effects. You may report side effects to FDA at 1-800-FDA-1088. Where should I keep my medication? This medication is given in a hospital or clinic. It will not be  stored at home. NOTE: This sheet is a summary. It may not cover all possible information. If you have questions about this medicine, talk to your doctor, pharmacist, or health care provider.  2023 Elsevier/Gold Standard (2021-05-01 00:00:00)

## 2022-04-07 NOTE — Progress Notes (Signed)
Patient's spouse expressed during today's appointment that she would like for him to get his Nplate injection on time so they are not late for his radiation appointment on 04/14/2022. She added that if this would not be possible, then they are okay with leaving to his radiation appointment and coming back for the Nplate Injection.

## 2022-04-08 ENCOUNTER — Other Ambulatory Visit: Payer: Self-pay

## 2022-04-08 ENCOUNTER — Ambulatory Visit
Admission: RE | Admit: 2022-04-08 | Discharge: 2022-04-08 | Disposition: A | Discharge status: Home / Self Care | Payer: Medicare Other | Source: Ambulatory Visit | Attending: Radiation Oncology | Admitting: Radiation Oncology

## 2022-04-08 DIAGNOSIS — C61 Malignant neoplasm of prostate: Secondary | ICD-10-CM | POA: Diagnosis not present

## 2022-04-08 DIAGNOSIS — Z192 Hormone resistant malignancy status: Secondary | ICD-10-CM | POA: Diagnosis not present

## 2022-04-08 DIAGNOSIS — Z510 Encounter for antineoplastic radiation therapy: Secondary | ICD-10-CM | POA: Diagnosis not present

## 2022-04-08 DIAGNOSIS — C7951 Secondary malignant neoplasm of bone: Secondary | ICD-10-CM | POA: Diagnosis not present

## 2022-04-08 LAB — RAD ONC ARIA SESSION SUMMARY
Course Elapsed Days: 5
Plan Fractions Treated to Date: 4
Plan Prescribed Dose Per Fraction: 3 Gy
Plan Total Fractions Prescribed: 10
Plan Total Prescribed Dose: 30 Gy
Reference Point Dosage Given to Date: 12 Gy
Reference Point Session Dosage Given: 3 Gy
Session Number: 4

## 2022-04-09 ENCOUNTER — Ambulatory Visit
Admission: RE | Admit: 2022-04-09 | Discharge: 2022-04-09 | Disposition: A | Discharge status: Home / Self Care | Payer: Medicare Other | Source: Ambulatory Visit | Attending: Radiation Oncology | Admitting: Radiation Oncology

## 2022-04-09 ENCOUNTER — Other Ambulatory Visit: Payer: Self-pay

## 2022-04-09 ENCOUNTER — Telehealth: Payer: Self-pay

## 2022-04-09 DIAGNOSIS — C61 Malignant neoplasm of prostate: Secondary | ICD-10-CM | POA: Diagnosis not present

## 2022-04-09 DIAGNOSIS — Z192 Hormone resistant malignancy status: Secondary | ICD-10-CM | POA: Diagnosis not present

## 2022-04-09 DIAGNOSIS — Z510 Encounter for antineoplastic radiation therapy: Secondary | ICD-10-CM | POA: Diagnosis not present

## 2022-04-09 DIAGNOSIS — C7951 Secondary malignant neoplasm of bone: Secondary | ICD-10-CM | POA: Diagnosis not present

## 2022-04-09 LAB — RAD ONC ARIA SESSION SUMMARY
Course Elapsed Days: 6
Plan Fractions Treated to Date: 5
Plan Prescribed Dose Per Fraction: 3 Gy
Plan Total Fractions Prescribed: 10
Plan Total Prescribed Dose: 30 Gy
Reference Point Dosage Given to Date: 15 Gy
Reference Point Session Dosage Given: 3 Gy
Session Number: 5

## 2022-04-09 NOTE — Telephone Encounter (Signed)
        Patient  visited [facility] on 04/01/2022  for fall.   Telephone encounter attempt :  1st  A HIPAA compliant voice message was left requesting a return call.  Instructed patient to call back at [phone].   [place] Resource Care Guide   [email or web address]  ?? [phone]   Website: [email or web address]  [email or web address]

## 2022-04-10 ENCOUNTER — Ambulatory Visit: Payer: Medicare Other

## 2022-04-10 ENCOUNTER — Ambulatory Visit
Admission: RE | Admit: 2022-04-10 | Discharge: 2022-04-10 | Disposition: A | Discharge status: Home / Self Care | Payer: Medicare Other | Source: Ambulatory Visit | Attending: Radiation Oncology | Admitting: Radiation Oncology

## 2022-04-10 ENCOUNTER — Other Ambulatory Visit: Payer: Self-pay

## 2022-04-10 DIAGNOSIS — Z192 Hormone resistant malignancy status: Secondary | ICD-10-CM | POA: Diagnosis not present

## 2022-04-10 DIAGNOSIS — Z510 Encounter for antineoplastic radiation therapy: Secondary | ICD-10-CM | POA: Diagnosis not present

## 2022-04-10 DIAGNOSIS — C61 Malignant neoplasm of prostate: Secondary | ICD-10-CM | POA: Diagnosis not present

## 2022-04-10 DIAGNOSIS — C7951 Secondary malignant neoplasm of bone: Secondary | ICD-10-CM | POA: Diagnosis not present

## 2022-04-10 LAB — RAD ONC ARIA SESSION SUMMARY
Course Elapsed Days: 7
Plan Fractions Treated to Date: 6
Plan Prescribed Dose Per Fraction: 3 Gy
Plan Total Fractions Prescribed: 10
Plan Total Prescribed Dose: 30 Gy
Reference Point Dosage Given to Date: 18 Gy
Reference Point Session Dosage Given: 3 Gy
Session Number: 6

## 2022-04-11 ENCOUNTER — Ambulatory Visit
Admission: RE | Admit: 2022-04-11 | Discharge: 2022-04-11 | Disposition: A | Discharge status: Home / Self Care | Payer: Medicare Other | Source: Ambulatory Visit | Attending: Radiation Oncology | Admitting: Radiation Oncology

## 2022-04-11 ENCOUNTER — Other Ambulatory Visit: Payer: Self-pay

## 2022-04-11 DIAGNOSIS — C7951 Secondary malignant neoplasm of bone: Secondary | ICD-10-CM | POA: Diagnosis not present

## 2022-04-11 DIAGNOSIS — Z192 Hormone resistant malignancy status: Secondary | ICD-10-CM | POA: Diagnosis not present

## 2022-04-11 DIAGNOSIS — C61 Malignant neoplasm of prostate: Secondary | ICD-10-CM | POA: Diagnosis not present

## 2022-04-11 DIAGNOSIS — Z510 Encounter for antineoplastic radiation therapy: Secondary | ICD-10-CM | POA: Diagnosis not present

## 2022-04-11 LAB — RAD ONC ARIA SESSION SUMMARY
Course Elapsed Days: 8
Plan Fractions Treated to Date: 7
Plan Prescribed Dose Per Fraction: 3 Gy
Plan Total Fractions Prescribed: 10
Plan Total Prescribed Dose: 30 Gy
Reference Point Dosage Given to Date: 21 Gy
Reference Point Session Dosage Given: 3 Gy
Session Number: 7

## 2022-04-14 ENCOUNTER — Ambulatory Visit
Admission: RE | Admit: 2022-04-14 | Discharge: 2022-04-14 | Disposition: A | Discharge status: Home / Self Care | Payer: Medicare Other | Source: Ambulatory Visit | Attending: Radiation Oncology | Admitting: Radiation Oncology

## 2022-04-14 ENCOUNTER — Inpatient Hospital Stay: Payer: Medicare Other

## 2022-04-14 ENCOUNTER — Inpatient Hospital Stay (HOSPITAL_BASED_OUTPATIENT_CLINIC_OR_DEPARTMENT_OTHER): Payer: Medicare Other | Admitting: Oncology

## 2022-04-14 ENCOUNTER — Other Ambulatory Visit: Payer: Self-pay

## 2022-04-14 VITALS — BP 114/58 | HR 68 | Temp 98.1°F | Resp 18 | Ht 65.0 in | Wt 153.0 lb

## 2022-04-14 DIAGNOSIS — D693 Immune thrombocytopenic purpura: Secondary | ICD-10-CM

## 2022-04-14 DIAGNOSIS — Z510 Encounter for antineoplastic radiation therapy: Secondary | ICD-10-CM | POA: Diagnosis not present

## 2022-04-14 DIAGNOSIS — C61 Malignant neoplasm of prostate: Secondary | ICD-10-CM

## 2022-04-14 DIAGNOSIS — C7951 Secondary malignant neoplasm of bone: Secondary | ICD-10-CM | POA: Diagnosis not present

## 2022-04-14 DIAGNOSIS — Z192 Hormone resistant malignancy status: Secondary | ICD-10-CM | POA: Diagnosis not present

## 2022-04-14 LAB — CBC WITH DIFFERENTIAL (CANCER CENTER ONLY)
Abs Immature Granulocytes: 1.04 10*3/uL — ABNORMAL HIGH (ref 0.00–0.07)
Basophils Absolute: 0 10*3/uL (ref 0.0–0.1)
Basophils Relative: 0 %
Eosinophils Absolute: 0.3 10*3/uL (ref 0.0–0.5)
Eosinophils Relative: 2 %
HCT: 26 % — ABNORMAL LOW (ref 39.0–52.0)
Hemoglobin: 8 g/dL — ABNORMAL LOW (ref 13.0–17.0)
Immature Granulocytes: 8 %
Lymphocytes Relative: 6 %
Lymphs Abs: 0.8 10*3/uL (ref 0.7–4.0)
MCH: 24.9 pg — ABNORMAL LOW (ref 26.0–34.0)
MCHC: 30.8 g/dL (ref 30.0–36.0)
MCV: 81 fL (ref 80.0–100.0)
Monocytes Absolute: 2.3 10*3/uL — ABNORMAL HIGH (ref 0.1–1.0)
Monocytes Relative: 19 %
Neutro Abs: 8 10*3/uL — ABNORMAL HIGH (ref 1.7–7.7)
Neutrophils Relative %: 65 %
Platelet Count: 77 10*3/uL — ABNORMAL LOW (ref 150–400)
RBC: 3.21 MIL/uL — ABNORMAL LOW (ref 4.22–5.81)
RDW: 20.8 % — ABNORMAL HIGH (ref 11.5–15.5)
WBC Count: 12.4 10*3/uL — ABNORMAL HIGH (ref 4.0–10.5)
nRBC: 1 % — ABNORMAL HIGH (ref 0.0–0.2)

## 2022-04-14 LAB — RAD ONC ARIA SESSION SUMMARY
Course Elapsed Days: 11
Plan Fractions Treated to Date: 8
Plan Prescribed Dose Per Fraction: 3 Gy
Plan Total Fractions Prescribed: 10
Plan Total Prescribed Dose: 30 Gy
Reference Point Dosage Given to Date: 24 Gy
Reference Point Session Dosage Given: 3 Gy
Session Number: 8

## 2022-04-14 MED ORDER — TRAMADOL HCL 50 MG PO TABS: 50.0000 mg | ORAL_TABLET | Freq: Three times a day (TID) | ORAL | 0 refills | Status: DC | PRN

## 2022-04-14 MED ORDER — ROMIPLOSTIM 125 MCG ~~LOC~~ SOLR
1.0000 ug/kg | Freq: Once | SUBCUTANEOUS | Status: AC
  Administered 2022-04-14: 70 ug via SUBCUTANEOUS
  Filled 2022-04-14: qty 0.14

## 2022-04-14 NOTE — Progress Notes (Signed)
[place] OFFICE PROGRESS NOTE   Diagnosis: Thrombocytopenia, prior state cancer, anemia  INTERVAL HISTORY:   [name] returns as scheduled.  He is completing a course of palliative radiation to the lumbar spine.  He reports increased malaise, anorexia, and persistent pain in the lower back and legs.  Tramadol helps the pain.  Objective:  Vital signs in last 24 hours:  Blood pressure (!) 114/58, pulse 68, temperature 98.1 F (36.7 C), temperature source Oral, resp. rate 18, height 5' 5"$  (1.651 m), weight 153 lb (69.4 kg), SpO2 99 %.    HEENT: Mild white coat over the tongue, no buccal thrush, single 2 to 3 mm ecchymosis at the right buccal mucosa Resp: Lungs clear bilaterally Cardio: Regular rhythm with premature beats GI: No hepatosplenomegaly Vascular: Trace lower leg edema bilaterally    Lab Results:  Lab Results  Component Value Date   WBC 12.4 (H) 04/14/2022   HGB 8.0 (L) 04/14/2022   HCT 26.0 (L) 04/14/2022   MCV 81.0 04/14/2022   PLT 77 (L) 04/14/2022   NEUTROABS 8.0 (H) 04/14/2022    CMP  Lab Results  Component Value Date   NA 139 03/31/2022   K 4.2 03/31/2022   CL 106 03/31/2022   CO2 23 03/31/2022   GLUCOSE 98 03/31/2022   BUN 27 (H) 03/31/2022   CREATININE 1.21 03/31/2022   CALCIUM 8.8 (L) 03/31/2022   PROT 6.5 03/31/2022   ALBUMIN 3.8 03/31/2022   AST 19 03/31/2022   ALT 12 03/31/2022   ALKPHOS 1,062 (H) 03/31/2022   BILITOT 0.5 03/31/2022   GFRNONAA 55 (L) 03/31/2022   GFRAA 59 (L) 10/31/2019    Medications: I have reviewed the patient's current medications.   Assessment/Plan:  Thrombocytopenia Bone marrow biopsy 07/08/2016-no evidence of B-cell lymphoma, 40% cellular bone marrow with trilineage hematopoiesis, megakaryocytes present with normal morphology, normal cytogenetics, negative for BRAF mutation Flow cytometry 01/05/2009-no monoclonal B-cell or phenotypically abnormal T-cell population Prednisone starting  06/29/2019 Nplate 07/04/2019, 624THL, Nplate 07/18/2019 Prednisone 20 mg daily beginning 07/25/2019 Platelets 11,000 on 08/15/2019, prednisone increased to 40 mg daily, Nplate resume D34-534  Prednisone decreased to 30 mg daily 08/25/2019, Nplate continued Prednisone continued at 30 mg daily 09/13/2019, weekly Nplate continued Prednisone taper to 20 mg daily 09/23/2019, weekly Nplate continued Prednisone taper to 10 mg daily 09/30/2019, weekly Nplate continued Prednisone taper to 5 mg daily 10/21/2019, weekly Nplate continued Prednisone taper to 5 mg every other day for 5 doses then stop 11/11/2019, weekly Nplate continued Nplate last given D34-534 Promacta cost prohibitive Nplate beginning X33443, held 08/27/2020 and 09/03/2020 due to patient vacation Nplate resumed X33443 Nplate held 579FGE secondary to an elevated platelet count Nplate resumed with dose reduction to 1 mcg/kg 10/09/2020 Nplate dose decreased to 0.5 mcg/kg 10/29/2020 Nplate dose increased to 1 mcg/kg 11/07/2020 Hairy cell leukemia 1982, status post splenectomy Coronary artery disease Aortic stenosis Liposarcoma at the right chest wall resected in 2013 Macular degeneration Hearing loss Basal cell carcinoma left cheek 05/24/2019 Left upper lobe nodule, faint FDG activity on PET at [facility] 05/31/2013 Left lung mass has been present since 2007, negative bronchoscopy December 2007 Status post TAVR procedure 09/06/2019 Admission with Pseudomonas urosepsis 09/16/2019 Acute left MCA distribution CVA 09/16/2019 Covid January 2022 Mild anemia, 1 of 3 stool Hemoccult cards positive for occult blood 10/14/2020, ferritin low 06/29/2020 Virtual colonoscopy 06/25/2021 -5 mm polypoid defect in the ascending colon 15.  Prostate cancer-sclerotic bone lesions/prostatic hypertrophy on CT 06/25/2021; Lupron every 3 months beginning  08/02/2021, bicalutamide 08/02/2021 for 2 weeks Abiraterone/prednisone 11/05/2021 Lupron every 3 months beginning  08/02/2021 12/16/2021 PSA higher (457) 12/26/2021 testosterone less than 3 Guardant360 01/13/2022-tumor mutation burden 8.61, MSI high not detected, NRAS G13D,PALB2 VUS Lupron every 3 months continued, Casodex 50 mg daily 02/10/2022 Radiation to lumbar spine 04/03/2022 16.  Pneumonia 09/30/2021      Disposition: Mr Jeannot has metastatic hormone refractory prostate cancer.  He is completing a course of palliative radiation to the lumbar spine.  He continues to have low back and leg pain.  We discussed systemic treatment options for prostate cancer.  He does not appear to be a candidate for chemotherapy.  We discussed Pluvicto.  He does not wish to consider Pluvicto.  We discussed comfort care and hospice.  He is a living will and no CODE BLUE in place.  He does not wish to enter hospice at present.  He will discuss hospice care with his family.  Mr. Mausolf will continue tramadol as needed for pain.  He will continue weekly Nplate.  He will return for an office visit in 2 weeks.  Betsy Coder, MD  04/14/2022  10:11 AM

## 2022-04-15 ENCOUNTER — Telehealth: Payer: Self-pay

## 2022-04-15 ENCOUNTER — Ambulatory Visit
Admission: RE | Admit: 2022-04-15 | Discharge: 2022-04-15 | Disposition: A | Discharge status: Home / Self Care | Payer: Medicare Other | Source: Ambulatory Visit | Attending: Radiation Oncology | Admitting: Radiation Oncology

## 2022-04-15 ENCOUNTER — Other Ambulatory Visit: Payer: Self-pay

## 2022-04-15 DIAGNOSIS — C7951 Secondary malignant neoplasm of bone: Secondary | ICD-10-CM | POA: Diagnosis not present

## 2022-04-15 DIAGNOSIS — Z510 Encounter for antineoplastic radiation therapy: Secondary | ICD-10-CM | POA: Diagnosis not present

## 2022-04-15 DIAGNOSIS — Z192 Hormone resistant malignancy status: Secondary | ICD-10-CM | POA: Diagnosis not present

## 2022-04-15 DIAGNOSIS — C61 Malignant neoplasm of prostate: Secondary | ICD-10-CM | POA: Diagnosis not present

## 2022-04-15 LAB — RAD ONC ARIA SESSION SUMMARY
Course Elapsed Days: 12
Plan Fractions Treated to Date: 9
Plan Prescribed Dose Per Fraction: 3 Gy
Plan Total Fractions Prescribed: 10
Plan Total Prescribed Dose: 30 Gy
Reference Point Dosage Given to Date: 27 Gy
Reference Point Session Dosage Given: 3 Gy
Session Number: 9

## 2022-04-15 NOTE — Telephone Encounter (Signed)
     Patient  visit on 04/01/2022  at [facility] was for fall.  Have you been able to follow up with your primary care physician? Patient followed up with Radiologist and  Oncologist.  The patient was or was not able to obtain any needed medicine or equipment. No medication prescribed.  Are there diet recommendations that you are having difficulty following? No  Patient expresses understanding of discharge instructions and education provided has no other needs at this time. Yes   [place] Resource Care Guide   ??[email or web address]  ?? [phone]   Website: [email or web address]  [email or web address]

## 2022-04-16 ENCOUNTER — Encounter: Payer: Self-pay | Admitting: Oncology

## 2022-04-16 ENCOUNTER — Encounter: Payer: Self-pay | Admitting: Urology

## 2022-04-16 ENCOUNTER — Other Ambulatory Visit: Payer: Self-pay

## 2022-04-16 ENCOUNTER — Other Ambulatory Visit: Payer: Self-pay | Admitting: Nurse Practitioner

## 2022-04-16 ENCOUNTER — Ambulatory Visit
Admission: RE | Admit: 2022-04-16 | Discharge: 2022-04-16 | Disposition: A | Discharge status: Home / Self Care | Payer: Medicare Other | Source: Ambulatory Visit | Attending: Radiation Oncology | Admitting: Radiation Oncology

## 2022-04-16 DIAGNOSIS — C7951 Secondary malignant neoplasm of bone: Secondary | ICD-10-CM | POA: Diagnosis not present

## 2022-04-16 DIAGNOSIS — Z192 Hormone resistant malignancy status: Secondary | ICD-10-CM | POA: Diagnosis not present

## 2022-04-16 DIAGNOSIS — Z510 Encounter for antineoplastic radiation therapy: Secondary | ICD-10-CM | POA: Diagnosis not present

## 2022-04-16 DIAGNOSIS — C61 Malignant neoplasm of prostate: Secondary | ICD-10-CM | POA: Diagnosis not present

## 2022-04-16 LAB — RAD ONC ARIA SESSION SUMMARY
Course Elapsed Days: 13
Plan Fractions Treated to Date: 10
Plan Prescribed Dose Per Fraction: 3 Gy
Plan Total Fractions Prescribed: 10
Plan Total Prescribed Dose: 30 Gy
Reference Point Dosage Given to Date: 30 Gy
Reference Point Session Dosage Given: 3 Gy
Session Number: 10

## 2022-04-17 ENCOUNTER — Telehealth: Payer: Self-pay | Admitting: *Deleted

## 2022-04-17 ENCOUNTER — Encounter: Payer: Self-pay | Admitting: Oncology

## 2022-04-17 ENCOUNTER — Other Ambulatory Visit: Payer: Self-pay

## 2022-04-17 DIAGNOSIS — D693 Immune thrombocytopenic purpura: Secondary | ICD-10-CM

## 2022-04-17 NOTE — Telephone Encounter (Signed)
[name] reports he has had several diarrhea stools today and asking what he can take? Has completed 10 treatments of radiation therapy. Instructed her to give him #2 Imodium AD now and can take 1-2 as needed up to 8 tab/day. Push decaf fluids and slowing adjust diet from bland after diarrhea resolves. Call early in am if it does not stop-may need IVF. She agrees to this plan.

## 2022-04-21 ENCOUNTER — Inpatient Hospital Stay: Payer: Medicare Other

## 2022-04-21 ENCOUNTER — Encounter: Payer: Self-pay | Admitting: Oncology

## 2022-04-21 ENCOUNTER — Inpatient Hospital Stay (HOSPITAL_BASED_OUTPATIENT_CLINIC_OR_DEPARTMENT_OTHER): Payer: Medicare Other | Admitting: Nurse Practitioner

## 2022-04-21 VITALS — BP 126/55 | HR 63 | Temp 97.7°F | Resp 20 | Ht 65.0 in | Wt 146.0 lb

## 2022-04-21 VITALS — BP 126/55 | HR 63 | Temp 97.7°F | Resp 20 | Ht 65.0 in | Wt 146.4 lb

## 2022-04-21 DIAGNOSIS — D693 Immune thrombocytopenic purpura: Secondary | ICD-10-CM

## 2022-04-21 DIAGNOSIS — E860 Dehydration: Secondary | ICD-10-CM

## 2022-04-21 DIAGNOSIS — C61 Malignant neoplasm of prostate: Secondary | ICD-10-CM

## 2022-04-21 DIAGNOSIS — C7951 Secondary malignant neoplasm of bone: Secondary | ICD-10-CM

## 2022-04-21 LAB — CBC WITH DIFFERENTIAL (CANCER CENTER ONLY)
Abs Immature Granulocytes: 0.5 10*3/uL — ABNORMAL HIGH (ref 0.00–0.07)
Basophils Absolute: 0.1 10*3/uL (ref 0.0–0.1)
Basophils Relative: 1 %
Eosinophils Absolute: 0.1 10*3/uL (ref 0.0–0.5)
Eosinophils Relative: 2 %
HCT: 27.9 % — ABNORMAL LOW (ref 39.0–52.0)
Hemoglobin: 8.7 g/dL — ABNORMAL LOW (ref 13.0–17.0)
Lymphocytes Relative: 10 %
Lymphs Abs: 0.7 10*3/uL (ref 0.7–4.0)
MCH: 25 pg — ABNORMAL LOW (ref 26.0–34.0)
MCHC: 31.2 g/dL (ref 30.0–36.0)
MCV: 80.2 fL (ref 80.0–100.0)
Metamyelocytes Relative: 3 %
Monocytes Absolute: 1.8 10*3/uL — ABNORMAL HIGH (ref 0.1–1.0)
Monocytes Relative: 24 %
Myelocytes: 4 %
Neutro Abs: 4.1 10*3/uL (ref 1.7–7.7)
Neutrophils Relative %: 56 %
Platelet Count: 85 10*3/uL — ABNORMAL LOW (ref 150–400)
RBC: 3.48 MIL/uL — ABNORMAL LOW (ref 4.22–5.81)
RDW: 21.2 % — ABNORMAL HIGH (ref 11.5–15.5)
WBC Count: 7.4 10*3/uL (ref 4.0–10.5)
nRBC: 0.8 % — ABNORMAL HIGH (ref 0.0–0.2)

## 2022-04-21 LAB — CMP (CANCER CENTER ONLY)
ALT: 11 U/L (ref 0–44)
AST: 17 U/L (ref 15–41)
Albumin: 3.4 g/dL — ABNORMAL LOW (ref 3.5–5.0)
Alkaline Phosphatase: 1025 U/L — ABNORMAL HIGH (ref 38–126)
Anion gap: 11 (ref 5–15)
BUN: 20 mg/dL (ref 8–23)
CO2: 19 mmol/L — ABNORMAL LOW (ref 22–32)
Calcium: 8.2 mg/dL — ABNORMAL LOW (ref 8.9–10.3)
Chloride: 109 mmol/L (ref 98–111)
Creatinine: 1.1 mg/dL (ref 0.61–1.24)
GFR, Estimated: >60 mL/min (ref >60)
Glucose, Bld: 116 mg/dL — ABNORMAL HIGH (ref 70–99)
Potassium: 3.8 mmol/L (ref 3.5–5.1)
Sodium: 139 mmol/L (ref 135–145)
Total Bilirubin: 0.5 mg/dL (ref 0.3–1.2)
Total Protein: 5.8 g/dL — ABNORMAL LOW (ref 6.5–8.1)

## 2022-04-21 MED ORDER — ROMIPLOSTIM 125 MCG ~~LOC~~ SOLR
1.0000 ug/kg | Freq: Once | SUBCUTANEOUS | Status: AC
  Administered 2022-04-21: 65 ug via SUBCUTANEOUS
  Filled 2022-04-21: qty 0.13

## 2022-04-21 MED ORDER — SODIUM CHLORIDE 0.9 % IV SOLN: INTRAVENOUS | Status: AC

## 2022-04-21 NOTE — Progress Notes (Signed)
[place] OFFICE PROGRESS NOTE   Diagnosis: Thrombocytopenia, prostate cancer, anemia  INTERVAL HISTORY:   [name] is seen in an unscheduled visit due to nausea and diarrhea.  He reports onset of diarrhea beginning midweek, Wednesday or Thursday, last week.  Unable to quantify but reports multiple watery bowel movements.  He took 2 doses of Imodium and the diarrhea improved.  Last night in the evening hours he had 4 small "chunks" of stool passed.  He is nauseated.  Fluid intake is poor.  His wife estimates 12 to 18 ounces yesterday, less than that so far today.  He has intermittent gas discomfort.  No fever.  He is weak.  Objective:  Vital signs in last 24 hours:  Blood pressure (!) 126/55, pulse 63, temperature 97.7 F (36.5 C), temperature source Oral, resp. rate 20, height 5' 5"$  (1.651 m), weight 146 lb (66.2 kg), SpO2 95 %.    HEENT: No thrush or ulcers.  Mucous membranes appear moist. Resp: Lungs clear bilaterally. Cardio: Regular rhythm with premature beats. GI: Abdomen is soft.  Initially he had tenderness at the right lower abdomen.  On subsequent exam no tenderness with palpation.  No hepatosplenomegaly. Vascular: Trace lower leg edema bilaterally. Skin: Decrease in skin turgor.   Lab Results:  Lab Results  Component Value Date   WBC 7.4 04/21/2022   HGB 8.7 (L) 04/21/2022   HCT 27.9 (L) 04/21/2022   MCV 80.2 04/21/2022   PLT 85 (L) 04/21/2022   NEUTROABS 4.1 04/21/2022    Imaging:  No results found.  Medications: I have reviewed the patient's current medications.  Assessment/Plan: Thrombocytopenia Bone marrow biopsy 07/08/2016-no evidence of B-cell lymphoma, 40% cellular bone marrow with trilineage hematopoiesis, megakaryocytes present with normal morphology, normal cytogenetics, negative for BRAF mutation Flow cytometry 01/05/2009-no monoclonal B-cell or phenotypically abnormal T-cell population Prednisone starting 06/29/2019 Nplate  624THL, 624THL, Nplate 07/18/2019 Prednisone 20 mg daily beginning 07/25/2019 Platelets 11,000 on 08/15/2019, prednisone increased to 40 mg daily, Nplate resume D34-534  Prednisone decreased to 30 mg daily 08/25/2019, Nplate continued Prednisone continued at 30 mg daily 09/13/2019, weekly Nplate continued Prednisone taper to 20 mg daily 09/23/2019, weekly Nplate continued Prednisone taper to 10 mg daily 09/30/2019, weekly Nplate continued Prednisone taper to 5 mg daily 10/21/2019, weekly Nplate continued Prednisone taper to 5 mg every other day for 5 doses then stop 11/11/2019, weekly Nplate continued Nplate last given D34-534 Promacta cost prohibitive Nplate beginning X33443, held 08/27/2020 and 09/03/2020 due to patient vacation Nplate resumed X33443 Nplate held 579FGE secondary to an elevated platelet count Nplate resumed with dose reduction to 1 mcg/kg 10/09/2020 Nplate dose decreased to 0.5 mcg/kg 10/29/2020 Nplate dose increased to 1 mcg/kg 11/07/2020 Hairy cell leukemia 1982, status post splenectomy Coronary artery disease Aortic stenosis Liposarcoma at the right chest wall resected in 2013 Macular degeneration Hearing loss Basal cell carcinoma left cheek 05/24/2019 Left upper lobe nodule, faint FDG activity on PET at [facility] 05/31/2013 Left lung mass has been present since 2007, negative bronchoscopy December 2007 Status post TAVR procedure 09/06/2019 Admission with Pseudomonas urosepsis 09/16/2019 Acute left MCA distribution CVA 09/16/2019 Covid January 2022 Mild anemia, 1 of 3 stool Hemoccult cards positive for occult blood 10/14/2020, ferritin low 06/29/2020 Virtual colonoscopy 06/25/2021 -5 mm polypoid defect in the ascending colon 15.  Prostate cancer-sclerotic bone lesions/prostatic hypertrophy on CT 06/25/2021; Lupron every 3 months beginning 08/02/2021, bicalutamide 08/02/2021 for 2 weeks Abiraterone/prednisone 11/05/2021 Lupron every 3 months beginning 08/02/2021 12/16/2021 PSA higher  (457)  12/26/2021 testosterone less than 3 Guardant360 01/13/2022-tumor mutation burden 8.61, MSI high not detected, NRAS G13D,PALB2 VUS Lupron every 3 months continued, Casodex 50 mg daily 02/10/2022 Radiation to lumbar spine 04/03/2022-04/16/2022 16.  Pneumonia 09/30/2021  Disposition: [name] has metastatic prostate cancer.  He completed a course of radiation to the lumbar spine 04/03/2022 through 04/16/2022.  At the end of the course of radiation he developed diarrhea, subsequently developed nausea.  At today's visit he appears dehydrated.  We will try gentle IV hydration today and tomorrow.  He and his wife understand he needs to increase fluid intake orally.  Hemoglobin is likely falsely elevated due to dehydration.  Plan for repeat CBC tomorrow.  Next scheduled office visit is 1 week.  We are available to see him sooner if needed.  Patient seen with Dr. Benay Spice.    [name] ANP/GNP-BC   04/21/2022  12:40 PM This was a shared visit with [name].  [name] was interviewed and examined.  He developed nausea and diarrhea at the completion of spine radiation.  The diarrhea may be related to radiation.  Diarrhea has improved.  He appears dehydrated today.  He will receive intravenous fluids today and tomorrow.  We discussed hospice care.  He does not wish to enter hospice at present.  I was present for greater than 50% of today's visit.  I performed medical decision making.  Julieanne Manson, MD

## 2022-04-21 NOTE — Progress Notes (Signed)
Per [name], NP ok to increase IVF rate to 271m/hr.  Per LNed Card NP , ok to stop fluids at 1630. Patient scheduled for fluids on 04/22/22.

## 2022-04-21 NOTE — Patient Instructions (Signed)
Romiplostim Injection What is this medication? ROMIPLOSTIM (roe mi PLOE stim) treats low levels of platelets in your body caused by immune thrombocytopenia (ITP). It is prescribed when other medications have not worked or cannot be tolerated. It may also be used to help people who have been exposed to high doses of radiation. It works by increasing the amount of platelets in your blood. This lowers the risk of bleeding. This medicine may be used for other purposes; ask your health care provider or pharmacist if you have questions. COMMON BRAND NAME(S): Nplate What should I tell my care team before I take this medication? They need to know if you have any of these conditions: Blood clots Myelodysplastic syndrome An unusual or allergic reaction to romiplostim, mannitol, other medications, foods, dyes, or preservatives Pregnant or trying to get pregnant Breast-feeding How should I use this medication? This medication is injected under the skin. It is given by a care team in a hospital or clinic setting. A special MedGuide will be given to you before each treatment. Be sure to read this information carefully each time. Talk to your care team about the use of this medication in children. While it may be prescribed for children as young as newborns for selected conditions, precautions do apply. Overdosage: If you think you have taken too much of this medicine contact a poison control center or emergency room at once. NOTE: This medicine is only for you. Do not share this medicine with others. What if I miss a dose? Keep appointments for follow-up doses. It is important not to miss your dose. Call your care team if you are unable to keep an appointment. What may interact with this medication? Interactions are not expected. This list may not describe all possible interactions. Give your health care provider a list of all the medicines, herbs, non-prescription drugs, or dietary supplements you use. Also  tell them if you smoke, drink alcohol, or use illegal drugs. Some items may interact with your medicine. What should I watch for while using this medication? Visit your care team for regular checks on your progress. You may need blood work done while you are taking this medication. Your condition will be monitored carefully while you are receiving this medication. It is important not to miss any appointments. What side effects may I notice from receiving this medication? Side effects that you should report to your care team as soon as possible: Allergic reactions--skin rash, itching, hives, swelling of the face, lips, tongue, or throat Blood clot--pain, swelling, or warmth in the leg, shortness of breath, chest pain Side effects that usually do not require medical attention (report to your care team if they continue or are bothersome): Dizziness Joint pain Muscle pain Pain in the hands or feet Stomach pain Trouble sleeping This list may not describe all possible side effects. Call your doctor for medical advice about side effects. You may report side effects to FDA at 1-800-FDA-1088. Where should I keep my medication? This medication is given in a hospital or clinic. It will not be stored at home. NOTE: This sheet is a summary. It may not cover all possible information. If you have questions about this medicine, talk to your doctor, pharmacist, or health care provider.  2023 Elsevier/Gold Standard (2021-05-28 00:00:00)

## 2022-04-21 NOTE — Patient Instructions (Signed)

## 2022-04-22 ENCOUNTER — Inpatient Hospital Stay: Payer: Medicare Other

## 2022-04-22 ENCOUNTER — Inpatient Hospital Stay: Payer: Medicare Other | Admitting: Licensed Clinical Social Worker

## 2022-04-22 DIAGNOSIS — C7951 Secondary malignant neoplasm of bone: Secondary | ICD-10-CM | POA: Diagnosis not present

## 2022-04-22 DIAGNOSIS — C61 Malignant neoplasm of prostate: Secondary | ICD-10-CM

## 2022-04-22 DIAGNOSIS — E860 Dehydration: Secondary | ICD-10-CM

## 2022-04-22 DIAGNOSIS — D693 Immune thrombocytopenic purpura: Secondary | ICD-10-CM

## 2022-04-22 LAB — CMP (CANCER CENTER ONLY)
ALT: 11 U/L (ref 0–44)
AST: 19 U/L (ref 15–41)
Albumin: 3.4 g/dL — ABNORMAL LOW (ref 3.5–5.0)
Alkaline Phosphatase: 988 U/L — ABNORMAL HIGH (ref 38–126)
Anion gap: 9 (ref 5–15)
BUN: 20 mg/dL (ref 8–23)
CO2: 19 mmol/L — ABNORMAL LOW (ref 22–32)
Calcium: 8.3 mg/dL — ABNORMAL LOW (ref 8.9–10.3)
Chloride: 108 mmol/L (ref 98–111)
Creatinine: 1.08 mg/dL (ref 0.61–1.24)
GFR, Estimated: >60 mL/min (ref >60)
Glucose, Bld: 103 mg/dL — ABNORMAL HIGH (ref 70–99)
Potassium: 4 mmol/L (ref 3.5–5.1)
Sodium: 136 mmol/L (ref 135–145)
Total Bilirubin: 0.5 mg/dL (ref 0.3–1.2)
Total Protein: 5.5 g/dL — ABNORMAL LOW (ref 6.5–8.1)

## 2022-04-22 LAB — CBC WITH DIFFERENTIAL (CANCER CENTER ONLY)
Abs Immature Granulocytes: 0.2 10*3/uL — ABNORMAL HIGH (ref 0.00–0.07)
Band Neutrophils: 4 %
Basophils Absolute: 0 10*3/uL (ref 0.0–0.1)
Basophils Relative: 0 %
Eosinophils Absolute: 0.2 10*3/uL (ref 0.0–0.5)
Eosinophils Relative: 3 %
HCT: 28.8 % — ABNORMAL LOW (ref 39.0–52.0)
Hemoglobin: 8.9 g/dL — ABNORMAL LOW (ref 13.0–17.0)
Lymphocytes Relative: 5 %
Lymphs Abs: 0.4 10*3/uL — ABNORMAL LOW (ref 0.7–4.0)
MCH: 25.1 pg — ABNORMAL LOW (ref 26.0–34.0)
MCHC: 30.9 g/dL (ref 30.0–36.0)
MCV: 81.1 fL (ref 80.0–100.0)
Monocytes Absolute: 2.5 10*3/uL — ABNORMAL HIGH (ref 0.1–1.0)
Monocytes Relative: 32 %
Myelocytes: 3 %
Neutro Abs: 4.4 10*3/uL (ref 1.7–7.7)
Neutrophils Relative %: 53 %
Platelet Count: 58 10*3/uL — ABNORMAL LOW (ref 150–400)
RBC: 3.55 MIL/uL — ABNORMAL LOW (ref 4.22–5.81)
RDW: 21 % — ABNORMAL HIGH (ref 11.5–15.5)
WBC Count: 7.8 10*3/uL (ref 4.0–10.5)
nRBC: 0.8 % — ABNORMAL HIGH (ref 0.0–0.2)

## 2022-04-22 LAB — SAMPLE TO BLOOD BANK

## 2022-04-22 MED ORDER — ONDANSETRON HCL 8 MG PO TABS
8.0000 mg | ORAL_TABLET | Freq: Once | ORAL | Status: AC
  Administered 2022-04-22: 8 mg via ORAL
  Filled 2022-04-22: qty 1

## 2022-04-22 MED ORDER — SODIUM CHLORIDE 0.9 % IV SOLN: INTRAVENOUS | Status: AC

## 2022-04-22 NOTE — Progress Notes (Signed)
[place] Work  Holiday representative met with patient to offer support and assess for needs while he was in infusion.  Patient expressed not feeling well this morning.  He appeared to respond positively to life review.  He has a very strong faith and is accepting of whatever the future holds for him.  His wife is very supportive.  Patient stated he has his advance directives in place.  He expressed no needs at this time.     Margaree Mackintosh, LCSW  Clinical Social Worker [facility]

## 2022-04-22 NOTE — Patient Instructions (Signed)
Dehydration, Adult Dehydration is a condition in which there is not enough water or other fluids in the body. This happens when a person loses more fluids than he or she takes in. Important organs, such as the kidneys, brain, and heart, cannot function without a proper amount of fluids. Any loss of fluids from the body can lead to dehydration. Dehydration can be mild, moderate, or severe. It should be treated right away to prevent it from becoming severe. What are the causes? Dehydration may be caused by: Conditions that cause loss of water or other fluids, such as diarrhea, vomiting, or sweating or urinating a lot. Not drinking enough fluids, especially when you are ill or doing activities that require a lot of energy. Other illnesses and conditions, such as fever or infection. Certain medicines, such as medicines that remove excess fluid from the body (diuretics). Lack of safe drinking water. Not being able to get enough water and food. What increases the risk? The following factors may make you more likely to develop this condition: Having a long-term (chronic) illness that has not been treated properly, such as diabetes, heart disease, or kidney disease. Being 65 years of age or older. Having a disability. Living in a place that is high in altitude, where thinner, drier air causes more fluid loss. Doing exercises that put stress on your body for a long time (endurance sports). What are the signs or symptoms? Symptoms of dehydration depend on how severe it is. Mild or moderate dehydration Thirst. Dry lips or dry mouth. Dizziness or light-headedness, especially when standing up from a seated position. Muscle cramps. Dark urine. Urine may be the color of tea. Less urine or tears produced than usual. Headache. Severe dehydration Changes in skin. Your skin may be cold and clammy, blotchy, or pale. Your skin also may not return to normal after being lightly pinched and released. Little or  no tears, urine, or sweat. Changes in vital signs, such as rapid breathing and low blood pressure. Your pulse may be weak or may be faster than 100 beats a minute when you are sitting still. Other changes, such as: Feeling very thirsty. Sunken eyes. Cold hands and feet. Confusion. Being very tired (lethargic) or having trouble waking from sleep. Short-term weight loss. Loss of consciousness. How is this diagnosed? This condition is diagnosed based on your symptoms and a physical exam. You may have blood and urine tests to help confirm the diagnosis. How is this treated? Treatment for this condition depends on how severe it is. Treatment should be started right away. Do not wait until dehydration becomes severe. Severe dehydration is an emergency and needs to be treated in a hospital. Mild or moderate dehydration can be treated at home. You may be asked to: Drink more fluids. Drink an oral rehydration solution (ORS). This drink helps restore proper amounts of fluids and salts and minerals in the blood (electrolytes). Severe dehydration can be treated: With IV fluids. By correcting abnormal levels of electrolytes. This is often done by giving electrolytes through a tube that is passed through your nose and into your stomach (nasogastric tube, or NG tube). By treating the underlying cause of dehydration. Follow these instructions at home: Oral rehydration solution If told by your health care provider, drink an ORS: Make an ORS by following instructions on the package. Start by drinking small amounts, about  cup (120 mL) every 5-10 minutes. Slowly increase how much you drink until you have taken the amount recommended by your health   care provider. Eating and drinking        Drink enough clear fluid to keep your urine pale yellow. If you were told to drink an ORS, finish the ORS first and then start slowly drinking other clear fluids. Drink fluids such as: Water. Do not drink only  water. Doing that can lead to hyponatremia, which is having too little salt (sodium) in the body. Water from ice chips you suck on. Fruit juice that you have added water to (diluted fruit juice). Low-calorie sports drinks. Eat foods that contain a healthy balance of electrolytes, such as bananas, oranges, potatoes, tomatoes, and spinach. Do not drink alcohol. Avoid the following: Drinks that contain a lot of sugar. These include high-calorie sports drinks, fruit juice that is not diluted, and soda. Caffeine. Foods that are greasy or contain a lot of fat or sugar. General instructions Take over-the-counter and prescription medicines only as told by your health care provider. Do not take sodium tablets. Doing that can lead to having too much sodium in the body (hypernatremia). Return to your normal activities as told by your health care provider. Ask your health care provider what activities are safe for you. Keep all follow-up visits as told by your health care provider. This is important. Contact a health care provider if: You have muscle cramps, pain, or discomfort, such as: Pain in your abdomen and the pain gets worse or stays in one area (localizes). Stiff neck. You have a rash. You are more irritable than usual. You are sleepier or have a harder time waking than usual. You feel weak or dizzy. You feel very thirsty. Get help right away if you have: Any symptoms of severe dehydration. Symptoms of vomiting, such as: You cannot eat or drink without vomiting. Vomiting gets worse or does not go away. Vomit includes blood or green matter (bile). Symptoms that get worse with treatment. A fever. A severe headache. Problems with urination or bowel movements, such as: Diarrhea that gets worse or does not go away. Blood in your stool (feces). This may cause stool to look black and tarry. Not urinating, or urinating only a small amount of very dark urine, within 6-8 hours. Trouble  breathing. These symptoms may represent a serious problem that is an emergency. Do not wait to see if the symptoms will go away. Get medical help right away. Call your local emergency services (911 in the [place] Do not drive yourself to the hospital. Summary Dehydration is a condition in which there is not enough water or other fluids in the body. This happens when a person loses more fluids than he or she takes in. Treatment for this condition depends on how severe it is. Treatment should be started right away. Do not wait until dehydration becomes severe. Drink enough clear fluid to keep your urine pale yellow. If you were told to drink an oral rehydration solution (ORS), finish the ORS first and then start slowly drinking other clear fluids. Take over-the-counter and prescription medicines only as told by your health care provider. Get help right away if you have any symptoms of severe dehydration. This information is not intended to replace advice given to you by your health care provider. Make sure you discuss any questions you have with your health care provider. Document Revised: 06/26/2021 Document Reviewed: 09/30/2018 Elsevier Patient Education  2023 [employer]  

## 2022-04-23 ENCOUNTER — Other Ambulatory Visit: Payer: Self-pay

## 2022-04-23 DIAGNOSIS — C7951 Secondary malignant neoplasm of bone: Secondary | ICD-10-CM

## 2022-04-28 ENCOUNTER — Encounter: Payer: Self-pay | Admitting: Nurse Practitioner

## 2022-04-28 ENCOUNTER — Inpatient Hospital Stay (HOSPITAL_BASED_OUTPATIENT_CLINIC_OR_DEPARTMENT_OTHER): Payer: Medicare Other | Admitting: Nurse Practitioner

## 2022-04-28 ENCOUNTER — Inpatient Hospital Stay: Payer: Medicare Other

## 2022-04-28 ENCOUNTER — Encounter (HOSPITAL_COMMUNITY): Payer: Self-pay | Admitting: Internal Medicine

## 2022-04-28 ENCOUNTER — Other Ambulatory Visit: Payer: Self-pay

## 2022-04-28 ENCOUNTER — Encounter (HOSPITAL_COMMUNITY): Payer: Self-pay

## 2022-04-28 ENCOUNTER — Inpatient Hospital Stay (HOSPITAL_COMMUNITY)
Admission: AD | Admit: 2022-04-28 | Discharge: 2022-05-09 | DRG: 641 | Disposition: A | Discharge status: Hospice | Payer: Medicare Other | Source: Ambulatory Visit | Attending: Internal Medicine | Admitting: Internal Medicine

## 2022-04-28 VITALS — BP 110/59 | HR 80 | Temp 98.1°F | Resp 18 | Ht 65.0 in | Wt 146.0 lb

## 2022-04-28 DIAGNOSIS — G894 Chronic pain syndrome: Secondary | ICD-10-CM | POA: Diagnosis present

## 2022-04-28 DIAGNOSIS — Z888 Allergy status to other drugs, medicaments and biological substances status: Secondary | ICD-10-CM

## 2022-04-28 DIAGNOSIS — D693 Immune thrombocytopenic purpura: Secondary | ICD-10-CM | POA: Diagnosis not present

## 2022-04-28 DIAGNOSIS — Z87892 Personal history of anaphylaxis: Secondary | ICD-10-CM

## 2022-04-28 DIAGNOSIS — Z85828 Personal history of other malignant neoplasm of skin: Secondary | ICD-10-CM

## 2022-04-28 DIAGNOSIS — Z833 Family history of diabetes mellitus: Secondary | ICD-10-CM

## 2022-04-28 DIAGNOSIS — Z66 Do not resuscitate: Secondary | ICD-10-CM | POA: Insufficient documentation

## 2022-04-28 DIAGNOSIS — Z8042 Family history of malignant neoplasm of prostate: Secondary | ICD-10-CM

## 2022-04-28 DIAGNOSIS — Y842 Radiological procedure and radiotherapy as the cause of abnormal reaction of the patient, or of later complication, without mention of misadventure at the time of the procedure: Secondary | ICD-10-CM | POA: Diagnosis present

## 2022-04-28 DIAGNOSIS — Z515 Encounter for palliative care: Secondary | ICD-10-CM | POA: Diagnosis not present

## 2022-04-28 DIAGNOSIS — H3530 Unspecified macular degeneration: Secondary | ICD-10-CM | POA: Diagnosis present

## 2022-04-28 DIAGNOSIS — E785 Hyperlipidemia, unspecified: Secondary | ICD-10-CM | POA: Diagnosis present

## 2022-04-28 DIAGNOSIS — E440 Moderate protein-calorie malnutrition: Secondary | ICD-10-CM | POA: Insufficient documentation

## 2022-04-28 DIAGNOSIS — K520 Gastroenteritis and colitis due to radiation: Secondary | ICD-10-CM | POA: Diagnosis present

## 2022-04-28 DIAGNOSIS — K219 Gastro-esophageal reflux disease without esophagitis: Secondary | ICD-10-CM | POA: Diagnosis present

## 2022-04-28 DIAGNOSIS — D630 Anemia in neoplastic disease: Secondary | ICD-10-CM | POA: Diagnosis present

## 2022-04-28 DIAGNOSIS — Z952 Presence of prosthetic heart valve: Secondary | ICD-10-CM | POA: Diagnosis not present

## 2022-04-28 DIAGNOSIS — D696 Thrombocytopenia, unspecified: Secondary | ICD-10-CM

## 2022-04-28 DIAGNOSIS — N281 Cyst of kidney, acquired: Secondary | ICD-10-CM | POA: Diagnosis present

## 2022-04-28 DIAGNOSIS — C61 Malignant neoplasm of prostate: Secondary | ICD-10-CM

## 2022-04-28 DIAGNOSIS — R197 Diarrhea, unspecified: Secondary | ICD-10-CM | POA: Diagnosis not present

## 2022-04-28 DIAGNOSIS — Z87891 Personal history of nicotine dependence: Secondary | ICD-10-CM

## 2022-04-28 DIAGNOSIS — R457 State of emotional shock and stress, unspecified: Secondary | ICD-10-CM | POA: Diagnosis not present

## 2022-04-28 DIAGNOSIS — I350 Nonrheumatic aortic (valve) stenosis: Secondary | ICD-10-CM | POA: Diagnosis present

## 2022-04-28 DIAGNOSIS — R52 Pain, unspecified: Secondary | ICD-10-CM | POA: Diagnosis not present

## 2022-04-28 DIAGNOSIS — C7951 Secondary malignant neoplasm of bone: Secondary | ICD-10-CM | POA: Diagnosis not present

## 2022-04-28 DIAGNOSIS — E8809 Other disorders of plasma-protein metabolism, not elsewhere classified: Secondary | ICD-10-CM | POA: Diagnosis not present

## 2022-04-28 DIAGNOSIS — Z8249 Family history of ischemic heart disease and other diseases of the circulatory system: Secondary | ICD-10-CM

## 2022-04-28 DIAGNOSIS — Z8616 Personal history of COVID-19: Secondary | ICD-10-CM

## 2022-04-28 DIAGNOSIS — C801 Malignant (primary) neoplasm, unspecified: Secondary | ICD-10-CM | POA: Diagnosis not present

## 2022-04-28 DIAGNOSIS — Z882 Allergy status to sulfonamides status: Secondary | ICD-10-CM

## 2022-04-28 DIAGNOSIS — I480 Paroxysmal atrial fibrillation: Secondary | ICD-10-CM | POA: Diagnosis present

## 2022-04-28 DIAGNOSIS — Z823 Family history of stroke: Secondary | ICD-10-CM

## 2022-04-28 DIAGNOSIS — G893 Neoplasm related pain (acute) (chronic): Secondary | ICD-10-CM | POA: Diagnosis present

## 2022-04-28 DIAGNOSIS — C7982 Secondary malignant neoplasm of genital organs: Secondary | ICD-10-CM

## 2022-04-28 DIAGNOSIS — Z9081 Acquired absence of spleen: Secondary | ICD-10-CM

## 2022-04-28 DIAGNOSIS — Z7952 Long term (current) use of systemic steroids: Secondary | ICD-10-CM

## 2022-04-28 DIAGNOSIS — E860 Dehydration: Principal | ICD-10-CM | POA: Diagnosis present

## 2022-04-28 DIAGNOSIS — R410 Disorientation, unspecified: Secondary | ICD-10-CM | POA: Diagnosis not present

## 2022-04-28 DIAGNOSIS — Z8673 Personal history of transient ischemic attack (TIA), and cerebral infarction without residual deficits: Secondary | ICD-10-CM

## 2022-04-28 DIAGNOSIS — R54 Age-related physical debility: Secondary | ICD-10-CM | POA: Diagnosis present

## 2022-04-28 DIAGNOSIS — R1110 Vomiting, unspecified: Secondary | ICD-10-CM | POA: Diagnosis not present

## 2022-04-28 DIAGNOSIS — R4589 Other symptoms and signs involving emotional state: Secondary | ICD-10-CM

## 2022-04-28 DIAGNOSIS — R627 Adult failure to thrive: Secondary | ICD-10-CM | POA: Diagnosis not present

## 2022-04-28 DIAGNOSIS — Z7401 Bed confinement status: Secondary | ICD-10-CM | POA: Diagnosis not present

## 2022-04-28 DIAGNOSIS — Z79899 Other long term (current) drug therapy: Secondary | ICD-10-CM | POA: Diagnosis not present

## 2022-04-28 DIAGNOSIS — F32A Depression, unspecified: Secondary | ICD-10-CM | POA: Diagnosis present

## 2022-04-28 DIAGNOSIS — M810 Age-related osteoporosis without current pathological fracture: Secondary | ICD-10-CM | POA: Diagnosis present

## 2022-04-28 DIAGNOSIS — I10 Essential (primary) hypertension: Secondary | ICD-10-CM | POA: Diagnosis present

## 2022-04-28 DIAGNOSIS — Z7189 Other specified counseling: Secondary | ICD-10-CM | POA: Diagnosis not present

## 2022-04-28 DIAGNOSIS — Z881 Allergy status to other antibiotic agents status: Secondary | ICD-10-CM

## 2022-04-28 DIAGNOSIS — Z8701 Personal history of pneumonia (recurrent): Secondary | ICD-10-CM

## 2022-04-28 DIAGNOSIS — R110 Nausea: Secondary | ICD-10-CM | POA: Diagnosis present

## 2022-04-28 DIAGNOSIS — I2510 Atherosclerotic heart disease of native coronary artery without angina pectoris: Secondary | ICD-10-CM | POA: Diagnosis present

## 2022-04-28 DIAGNOSIS — Z85038 Personal history of other malignant neoplasm of large intestine: Secondary | ICD-10-CM

## 2022-04-28 DIAGNOSIS — H9190 Unspecified hearing loss, unspecified ear: Secondary | ICD-10-CM | POA: Diagnosis present

## 2022-04-28 DIAGNOSIS — F419 Anxiety disorder, unspecified: Secondary | ICD-10-CM | POA: Diagnosis present

## 2022-04-28 DIAGNOSIS — Z8711 Personal history of peptic ulcer disease: Secondary | ICD-10-CM

## 2022-04-28 LAB — CBC WITH DIFFERENTIAL (CANCER CENTER ONLY)
Abs Immature Granulocytes: 1.9 10*3/uL — ABNORMAL HIGH (ref 0.00–0.07)
Band Neutrophils: 8 %
Basophils Absolute: 0.1 10*3/uL (ref 0.0–0.1)
Basophils Relative: 1 %
Eosinophils Absolute: 0.1 10*3/uL (ref 0.0–0.5)
Eosinophils Relative: 1 %
HCT: 30.1 % — ABNORMAL LOW (ref 39.0–52.0)
Hemoglobin: 9.4 g/dL — ABNORMAL LOW (ref 13.0–17.0)
Lymphocytes Relative: 13 %
Lymphs Abs: 1.2 10*3/uL (ref 0.7–4.0)
MCH: 25.1 pg — ABNORMAL LOW (ref 26.0–34.0)
MCHC: 31.2 g/dL (ref 30.0–36.0)
MCV: 80.5 fL (ref 80.0–100.0)
Metamyelocytes Relative: 8 %
Monocytes Absolute: 2.4 10*3/uL — ABNORMAL HIGH (ref 0.1–1.0)
Monocytes Relative: 26 %
Myelocytes: 13 %
Neutro Abs: 3.5 10*3/uL (ref 1.7–7.7)
Neutrophils Relative %: 30 %
Platelet Count: 32 10*3/uL — ABNORMAL LOW (ref 150–400)
RBC: 3.74 MIL/uL — ABNORMAL LOW (ref 4.22–5.81)
RDW: 21.2 % — ABNORMAL HIGH (ref 11.5–15.5)
WBC Count: 9.2 10*3/uL (ref 4.0–10.5)
nRBC: 1.1 % — ABNORMAL HIGH (ref 0.0–0.2)

## 2022-04-28 LAB — C DIFFICILE QUICK SCREEN W PCR REFLEX
C Diff antigen: NEGATIVE
C Diff interpretation: NOT DETECTED
C Diff toxin: NEGATIVE

## 2022-04-28 LAB — CMP (CANCER CENTER ONLY)
ALT: 10 U/L (ref 0–44)
AST: 28 U/L (ref 15–41)
Albumin: 3.1 g/dL — ABNORMAL LOW (ref 3.5–5.0)
Alkaline Phosphatase: 1195 U/L — ABNORMAL HIGH (ref 38–126)
Anion gap: 11 (ref 5–15)
BUN: 26 mg/dL — ABNORMAL HIGH (ref 8–23)
CO2: 19 mmol/L — ABNORMAL LOW (ref 22–32)
Calcium: 7.9 mg/dL — ABNORMAL LOW (ref 8.9–10.3)
Chloride: 109 mmol/L (ref 98–111)
Creatinine: 1.12 mg/dL (ref 0.61–1.24)
GFR, Estimated: >60 mL/min (ref >60)
Glucose, Bld: 101 mg/dL — ABNORMAL HIGH (ref 70–99)
Potassium: 4 mmol/L (ref 3.5–5.1)
Sodium: 139 mmol/L (ref 135–145)
Total Bilirubin: 0.5 mg/dL (ref 0.3–1.2)
Total Protein: 5.3 g/dL — ABNORMAL LOW (ref 6.5–8.1)

## 2022-04-28 MED ORDER — ATORVASTATIN CALCIUM 20 MG PO TABS
20.0000 mg | ORAL_TABLET | Freq: Every day | ORAL | Status: DC
  Administered 2022-04-28 – 2022-05-03 (×6): 20 mg via ORAL
  Filled 2022-04-28 (×6): qty 1

## 2022-04-28 MED ORDER — PREDNISONE 20 MG PO TABS
40.0000 mg | ORAL_TABLET | Freq: Every day | ORAL | Status: DC
  Administered 2022-04-29: 40 mg via ORAL
  Filled 2022-04-28: qty 2

## 2022-04-28 MED ORDER — PANTOPRAZOLE SODIUM 40 MG PO TBEC
40.0000 mg | DELAYED_RELEASE_TABLET | Freq: Every day | ORAL | Status: DC
  Administered 2022-04-29 – 2022-05-09 (×11): 40 mg via ORAL
  Filled 2022-04-28 (×11): qty 1

## 2022-04-28 MED ORDER — LATANOPROST 0.005 % OP SOLN
1.0000 [drp] | Freq: Every day | OPHTHALMIC | Status: DC
  Administered 2022-04-28 – 2022-05-08 (×11): 1 [drp] via OPHTHALMIC
  Filled 2022-04-28: qty 2.5

## 2022-04-28 MED ORDER — ACETAMINOPHEN 500 MG PO TABS: 1000.0000 mg | ORAL_TABLET | Freq: Three times a day (TID) | ORAL | Status: DC | PRN

## 2022-04-28 MED ORDER — NITROGLYCERIN 0.4 MG SL SUBL: 0.4000 mg | SUBLINGUAL_TABLET | SUBLINGUAL | Status: DC | PRN

## 2022-04-28 MED ORDER — SODIUM CHLORIDE 0.9 % IV SOLN: INTRAVENOUS | Status: DC

## 2022-04-28 MED ORDER — TIMOLOL MALEATE 0.5 % OP SOLN
1.0000 [drp] | Freq: Every day | OPHTHALMIC | Status: DC
  Administered 2022-04-29 – 2022-05-09 (×11): 1 [drp] via OPHTHALMIC
  Filled 2022-04-28: qty 5

## 2022-04-28 MED ORDER — TAMSULOSIN HCL 0.4 MG PO CAPS
0.8000 mg | ORAL_CAPSULE | Freq: Every day | ORAL | Status: DC
  Administered 2022-04-28 – 2022-05-09 (×12): 0.8 mg via ORAL
  Filled 2022-04-28 (×12): qty 2

## 2022-04-28 MED ORDER — TRAMADOL HCL 50 MG PO TABS
25.0000 mg | ORAL_TABLET | Freq: Three times a day (TID) | ORAL | Status: DC | PRN
  Administered 2022-04-28 – 2022-04-29 (×2): 25 mg via ORAL
  Filled 2022-04-28 (×3): qty 1

## 2022-04-28 MED ORDER — METOPROLOL TARTRATE 25 MG PO TABS
12.5000 mg | ORAL_TABLET | Freq: Two times a day (BID) | ORAL | Status: DC
  Administered 2022-04-28 – 2022-05-04 (×12): 12.5 mg via ORAL
  Filled 2022-04-28 (×12): qty 1

## 2022-04-28 MED ORDER — FINASTERIDE 5 MG PO TABS
5.0000 mg | ORAL_TABLET | Freq: Every day | ORAL | Status: DC
  Administered 2022-04-29 – 2022-05-09 (×11): 5 mg via ORAL
  Filled 2022-04-28 (×11): qty 1

## 2022-04-28 MED ORDER — FERROUS SULFATE 325 (65 FE) MG PO TABS
325.0000 mg | ORAL_TABLET | Freq: Every day | ORAL | Status: DC
  Administered 2022-04-29 – 2022-05-04 (×6): 325 mg via ORAL
  Filled 2022-04-28 (×6): qty 1

## 2022-04-28 MED ORDER — SERTRALINE HCL 50 MG PO TABS
50.0000 mg | ORAL_TABLET | Freq: Every day | ORAL | Status: DC
  Administered 2022-04-29 – 2022-05-09 (×11): 50 mg via ORAL
  Filled 2022-04-28 (×11): qty 1

## 2022-04-28 MED ORDER — ALBUTEROL SULFATE (2.5 MG/3ML) 0.083% IN NEBU: 2.5000 mg | INHALATION_SOLUTION | RESPIRATORY_TRACT | Status: DC | PRN

## 2022-04-28 MED ORDER — ONDANSETRON HCL 4 MG/2ML IJ SOLN
4.0000 mg | Freq: Four times a day (QID) | INTRAMUSCULAR | Status: DC | PRN
  Administered 2022-04-28 – 2022-05-02 (×5): 4 mg via INTRAVENOUS
  Filled 2022-04-28 (×5): qty 2

## 2022-04-28 MED ORDER — LOPERAMIDE HCL 2 MG PO CAPS
2.0000 mg | ORAL_CAPSULE | ORAL | Status: DC | PRN
  Administered 2022-04-29 – 2022-05-02 (×3): 4 mg via ORAL
  Filled 2022-04-28 (×4): qty 2

## 2022-04-28 MED ORDER — ONDANSETRON HCL 4 MG PO TABS
4.0000 mg | ORAL_TABLET | Freq: Four times a day (QID) | ORAL | Status: DC | PRN
  Administered 2022-04-30 (×2): 4 mg via ORAL
  Filled 2022-04-28 (×2): qty 1

## 2022-04-28 MED ORDER — ROMIPLOSTIM 125 MCG ~~LOC~~ SOLR
125.0000 ug | Freq: Once | SUBCUTANEOUS | Status: AC
  Administered 2022-04-28: 125 ug via SUBCUTANEOUS
  Filled 2022-04-28: qty 0.25

## 2022-04-28 NOTE — Progress Notes (Signed)
[place] OFFICE PROGRESS NOTE   Diagnosis: Thrombocytopenia, prostate cancer, anemia  INTERVAL HISTORY:   [name] returns as scheduled.  He was seen last week due to nausea and diarrhea.  Oral intake was poor.  He received IV fluids on 04/21/2022 and 04/22/2022.  He reports feeling better for about 1 to 2 days after the IV fluids.  Oral intake continues to be poor.  He is having multiple watery loose stools a day.  He continues Imodium.  He vomited this morning and in general vomits 1-2 times a day.  He denies fever.  No bleeding.  He has mild discomfort across the mid to lower abdomen.  His mouth feels dry.  He is weak.  His wife is having difficulty managing his care at home.  Objective:  Vital signs in last 24 hours:  Blood pressure (!) 110/59, pulse 80, temperature 98.1 F (36.7 C), temperature source Oral, resp. rate 18, height '5\' 5"'$  (1.651 m), weight 146 lb (66.2 kg), SpO2 99 %.    HEENT: Tongue is dry appearing. Resp: Faint crackles at the right lung base.  No respiratory distress. Cardio: Irregular. GI: Abdomen is soft, mild tenderness at the mid to lower abdomen.  No hepatosplenomegaly. Vascular: Pitting edema lower leg bilaterally. Skin: Pale appearing.   Lab Results:  Lab Results  Component Value Date   WBC 9.2 04/28/2022   HGB 9.4 (L) 04/28/2022   HCT 30.1 (L) 04/28/2022   MCV 80.5 04/28/2022   PLT 32 (L) 04/28/2022   NEUTROABS 3.5 04/28/2022    Imaging:  No results found.  Medications: I have reviewed the patient's current medications.  Assessment/Plan: Thrombocytopenia Bone marrow biopsy 07/08/2016-no evidence of B-cell lymphoma, 40% cellular bone marrow with trilineage hematopoiesis, megakaryocytes present with normal morphology, normal cytogenetics, negative for BRAF mutation Flow cytometry 01/05/2009-no monoclonal B-cell or phenotypically abnormal T-cell population Prednisone starting 06/29/2019 Nplate 07/04/2019, [id number], Nplate  [id number] Prednisone 20 mg daily beginning 07/25/2019 Platelets 11,000 on 08/15/2019, prednisone increased to 40 mg daily, Nplate resume [id number]  Prednisone decreased to 30 mg daily 08/25/2019, Nplate continued Prednisone continued at 30 mg daily 09/13/2019, weekly Nplate continued Prednisone taper to 20 mg daily 09/23/2019, weekly Nplate continued Prednisone taper to 10 mg daily 09/30/2019, weekly Nplate continued Prednisone taper to 5 mg daily 10/21/2019, weekly Nplate continued Prednisone taper to 5 mg every other day for 5 doses then stop 11/11/2019, weekly Nplate continued Nplate last given [id number] Promacta cost prohibitive Nplate beginning [id number], held 08/27/2020 and 09/03/2020 due to patient vacation Nplate resumed [id number] Nplate held [id number] secondary to an elevated platelet count Nplate resumed with dose reduction to 1 mcg/kg 10/09/2020 Nplate dose decreased to 0.5 mcg/kg 10/29/2020 Nplate dose increased to 1 mcg/kg 11/07/2020 Hairy cell leukemia 1982, status post splenectomy Coronary artery disease Aortic stenosis Liposarcoma at the right chest wall resected in 2013 Macular degeneration Hearing loss Basal cell carcinoma left cheek 05/24/2019 Left upper lobe nodule, faint FDG activity on PET at [facility] 05/31/2013 Left lung mass has been present since 2007, negative bronchoscopy December 2007 Status post TAVR procedure 09/06/2019 Admission with Pseudomonas urosepsis 09/16/2019 Acute left MCA distribution CVA 09/16/2019 Covid January 2022 Mild anemia, 1 of 3 stool Hemoccult cards positive for occult blood 10/14/2020, ferritin low 06/29/2020 Virtual colonoscopy 06/25/2021 -5 mm polypoid defect in the ascending colon 15.  Prostate cancer-sclerotic bone lesions/prostatic hypertrophy on CT 06/25/2021; Lupron every 3 months beginning 08/02/2021, bicalutamide 08/02/2021 for 2 weeks Abiraterone/prednisone 11/05/2021 Lupron every 3 months beginning 08/02/2021  12/16/2021 PSA higher (457) 12/26/2021  testosterone less than 3 Guardant360 01/13/2022-tumor mutation burden 8.61, MSI high not detected, NRAS G13D,PALB2 VUS Lupron every 3 months continued, Casodex 50 mg daily 02/10/2022 Radiation to lumbar spine 04/03/2022-04/16/2022 16.  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  He has prostate cancer with known metastatic disease to the bone.  He completed a course of radiation to the lumbar spine 04/03/2022 through 04/16/2022.  We saw him last week for nausea/vomiting/diarrhea.  He received IV fluids with initial improvement.  He presents today for routine follow-up and reports continued symptoms.  He again appears dehydrated.  We have started IV fluids and obtained a stool sample for C. difficile/GI pathogen panel testing.  We discussed hospitalization.  He is in agreement with this plan.  We are making necessary arrangements.  CODE STATUS/end-of-life issues reviewed.  He confirms DNR status.  Patient seen with Dr. Benay Spice.    [name] ANP/GNP-BC   04/28/2022  1:03 PM  This was a shared visit with [name].  [name] was interviewed and examined.  He completed palliative radiation to the lumbar spine on 04/16/2022.  Following radiation he developed diarrhea and now has nausea/vomiting.  The etiology of his GI symptoms is unclear.  We will check a stool sample for GI pathogens and C. difficile.  It is possible the GI symptoms are related to radiation, but unlikely.  I doubt his symptoms are directly related to the prostate cancer.  [name] has a poor prognosis with regard to the hormone refractory prostate cancer.  He is not a candidate for chemotherapy or Pluvicto.  I recommend hospice care.  He and his wife are in agreement with a referral for home hospice care.  He has chronic thrombocytopenia secondary to ITP and prostate cancer.  The platelet count has been controlled with Nplate for the past several years.  The progressive  thrombocytopenia over the past month may be related to progressive prostate cancer involving the bone marrow, radiation, or an infection.  I contacted the hospitalist service.  [name] will be admitted for further evaluation and supportive care.  I will follow [name] in the hospital.  I was present for greater than 50% of today's visit.  I performed medical decision making.  Julieanne Manson, MD

## 2022-04-28 NOTE — Patient Instructions (Signed)
Romiplostim Injection What is this medication? ROMIPLOSTIM (roe mi PLOE stim) treats low levels of platelets in your body caused by immune thrombocytopenia (ITP). It is prescribed when other medications have not worked or cannot be tolerated. It may also be used to help people who have been exposed to high doses of radiation. It works by increasing the amount of platelets in your blood. This lowers the risk of bleeding. This medicine may be used for other purposes; ask your health care provider or pharmacist if you have questions. COMMON BRAND NAME(S): Nplate What should I tell my care team before I take this medication? They need to know if you have any of these conditions: Blood clots Myelodysplastic syndrome An unusual or allergic reaction to romiplostim, mannitol, other medications, foods, dyes, or preservatives Pregnant or trying to get pregnant Breast-feeding How should I use this medication? This medication is injected under the skin. It is given by a care team in a hospital or clinic setting. A special MedGuide will be given to you before each treatment. Be sure to read this information carefully each time. Talk to your care team about the use of this medication in children. While it may be prescribed for children as young as newborns for selected conditions, precautions do apply. Overdosage: If you think you have taken too much of this medicine contact a poison control center or emergency room at once. NOTE: This medicine is only for you. Do not share this medicine with others. What if I miss a dose? Keep appointments for follow-up doses. It is important not to miss your dose. Call your care team if you are unable to keep an appointment. What may interact with this medication? Interactions are not expected. This list may not describe all possible interactions. Give your health care provider a list of all the medicines, herbs, non-prescription drugs, or dietary supplements you use. Also  tell them if you smoke, drink alcohol, or use illegal drugs. Some items may interact with your medicine. What should I watch for while using this medication? Visit your care team for regular checks on your progress. You may need blood work done while you are taking this medication. Your condition will be monitored carefully while you are receiving this medication. It is important not to miss any appointments. What side effects may I notice from receiving this medication? Side effects that you should report to your care team as soon as possible: Allergic reactions--skin rash, itching, hives, swelling of the face, lips, tongue, or throat Blood clot--pain, swelling, or warmth in the leg, shortness of breath, chest pain Side effects that usually do not require medical attention (report to your care team if they continue or are bothersome): Dizziness Joint pain Muscle pain Pain in the hands or feet Stomach pain Trouble sleeping This list may not describe all possible side effects. Call your doctor for medical advice about side effects. You may report side effects to FDA at 1-800-FDA-1088. Where should I keep my medication? This medication is given in a hospital or clinic. It will not be stored at home. NOTE: This sheet is a summary. It may not cover all possible information. If you have questions about this medicine, talk to your doctor, pharmacist, or health care provider.  2023 Elsevier/Gold Standard (2021-05-28 00:00:00)

## 2022-04-28 NOTE — H&P (Signed)
History and Physical    Patient: [name] [id number] DOB: 12-20-1923 DOA: 04/28/2022 DOS: the patient was seen and examined on 04/28/2022 PCP: Douglas Lopes, MD  Patient coming from: Home  Chief Complaint: No chief complaint on file.  HPI: [name] is a 89 y.o. male with medical history significant of hormone refractory prostate cancer-completed a course of palliative radiation to his lumbar spine on 2/13-he subsequently started developing nausea, diarrhea and generalized weakness.  He was sent to the hospital as a direct admit by his oncologist.  Per history obtained-prior to a month ago so-if patient was fairly independent and living at home with his spouse.  He completed palliative radiation to lumbar spine metastatic disease on around 2/13.  A day or so after completion of radiation-he started developing nausea-and had episodes of retching/gagging but never really vomited.  He then gradually started having diarrhea.  He describes his stools as watery-and around 3-4 times a day.  His appetite also started getting worse.  He was subsequently seen at the cancer center for routine follow-up-he was thought to be weak/dehydrated-and was sent to the hospital as a direct admit for further evaluation and treatment.  Patient denies any fever No headache No chest pain No obvious shortness of breath No acute abdominal pain-has some mild chronic pain in his pelvis at baseline. No hematuria or dysuria   Review of Systems: As mentioned in the history of present illness. All other systems reviewed and are negative. Past Medical History:  Diagnosis Date   Acute appendicitis    Anemia    Anxiety    Arthritis    "back" (03/14/2014)   Blood dyscrasia    hairy cell leukemia   CAD (coronary artery disease)    a. 03/14/14  s/p overlapping DES x2 to mid-distal RCA.   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    Colon cancer ([place]) 1984   Compression fracture of lumbar  spine, non-traumatic (HCC)    DJD (degenerative joint disease) of lumbar spine    Dyslipidemia    Dyspnea    Elevated PSA    GERD (gastroesophageal reflux disease)    Glaucoma    Hairy cell leukemia ([place]) dx'd 1980   Heart murmur    History of blood transfusion    "several; related to hairy cell leukemia & tx "   History of stomach ulcers 1968   Hypertension    Leukemia, hairy cell ([place])    Malnutrition ([place])    Osteoarthritis    Osteoporosis    Pneumonia    S/P TAVR (transcatheter aortic valve replacement) 09/06/2019   s/p TAVR with a 26 mm Edwards S3U via the TF approach by Dr. Angelena Form and Cyndia Bent.    Severe aortic stenosis    s/p tavr   Skin cancer of face    Past Surgical History:  Procedure Laterality Date   APPENDECTOMY  10/2007   BUBBLE STUDY  11/02/2019   Procedure: BUBBLE STUDY;  Surgeon: Elouise Munroe, MD;  Location: [facility] ENDOSCOPY;  Service: Cardiology;;   CATARACT EXTRACTION, BILATERAL Bilateral 02/2008   COLON SURGERY  1984   "sigmoid; open"   CORONARY ANGIOPLASTY WITH STENT PLACEMENT  03/14/2014   "2"   EYE SURGERY Bilateral    cataract   FRACTURE SURGERY     LEFT HEART CATHETERIZATION WITH CORONARY ANGIOGRAM N/A 03/14/2014   Procedure: LEFT HEART CATHETERIZATION WITH CORONARY ANGIOGRAM;  Surgeon: Peter M Martinique, MD;  Location: [facility] CATH LAB;  Service: Cardiovascular;  Laterality: N/A;   LIPOMA EXCISION Right 07/2011   liposarcoma resection; "back"   MOHS SURGERY Left 02/2011   MOHS SURGERY  X 3   "all on my face"   MOLE REMOVAL Left 1985   cheek   ORIF ANKLE FRACTURE Left 2000   RIGHT/LEFT HEART CATH AND CORONARY ANGIOGRAPHY N/A 07/26/2019   Procedure: RIGHT/LEFT HEART CATH AND CORONARY ANGIOGRAPHY;  Surgeon: Belva Crome, MD;  Location: [place] CV LAB;  Service: Cardiovascular;  Laterality: N/A;   SHOULDER SURGERY  04/2004   SPLENECTOMY  1992   TEE WITHOUT CARDIOVERSION N/A 09/06/2019   Procedure: TRANSESOPHAGEAL ECHOCARDIOGRAM (TEE);  Surgeon:  Burnell Blanks, MD;  Location: [place] CV LAB;  Service: Open Heart Surgery;  Laterality: N/A;   TEE WITHOUT CARDIOVERSION N/A 11/02/2019   Procedure: TRANSESOPHAGEAL ECHOCARDIOGRAM (TEE);  Surgeon: Elouise Munroe, MD;  Location: [place];  Service: Cardiology;  Laterality: N/A;   TONSILLECTOMY AND ADENOIDECTOMY  1939   TRANSCATHETER AORTIC VALVE REPLACEMENT, TRANSFEMORAL N/A 09/06/2019   Procedure: TRANSCATHETER AORTIC VALVE REPLACEMENT, TRANSFEMORAL;  Surgeon: Burnell Blanks, MD;  Location: [place] CV LAB;  Service: Open Heart Surgery;  Laterality: N/A;   Social History:  reports that he has quit smoking. His smoking use included cigarettes and cigars. He has a 0.25 pack-year smoking history. He has never used smokeless tobacco. He reports current alcohol use of about 9.0 standard drinks of alcohol per week. He reports that he does not use drugs.  Allergies  Allergen Reactions   Antazoline Anaphylaxis   Anesthetics, Amide     Other reaction(s): Unknown   Antihistamines, Chlorpheniramine-Type     Nervous, jittery   Calcitonin ([place])     Other reaction(s): rhinitis   Diphenhydramine    Loratadine    Sulfa Antibiotics Rash    Family History  Problem Relation Age of Onset   Congestive Heart Failure Father 57   Stroke Mother    Diabetes Mellitus II Brother    Prostate cancer Brother    Heart Problems Brother        CABG    Prior to Admission medications   Medication Sig Start Date End Date Taking? Authorizing Provider  acetaminophen (TYLENOL) 500 MG tablet Take 1,000 mg by mouth every 8 (eight) hours as needed for moderate pain.    [provider]  amoxicillin (AMOXIL) 500 MG tablet Take 4 capsules (2,000 mg) one hour prior to all dental visits. Patient not taking: Reported on 09/23/2021 07/03/20   Eileen Stanford, PA-C  atorvastatin (LIPITOR) 20 MG tablet TAKE 1 TABLET BY MOUTH DAILY AT 6 PM 05/24/21   Belva Crome, MD  Cholecalciferol  25 MCG (1000 UT) capsule Take 2,000 Units by mouth.    [provider]  ferrous sulfate 325 (65 FE) MG tablet Take 325 mg by mouth daily with breakfast.    [provider]  finasteride (PROSCAR) 5 MG tablet Take 5 mg by mouth daily. 11/14/21   [provider]  latanoprost (XALATAN) 0.005 % ophthalmic solution Place 1 drop into both eyes at bedtime.     [provider]  loperamide (IMODIUM) 2 MG capsule Take 2-4 mg by mouth as needed for diarrhea or loose stools. Max 8/day    [provider]  metoprolol tartrate (LOPRESSOR) 25 MG tablet TAKE 1/2 TABLET(12.5 MG) BY MOUTH DAILY 01/27/22   Belva Crome, MD  Multiple Vitamins-Minerals (PRESERVISION AREDS) CAPS 1 tablet 2 (two) times daily. 07/19/09   [provider]  nitroGLYCERIN (NITROSTAT) 0.4 MG SL tablet DISSOLVE 1 TABLET UNDER THE TONGUE EVERY 5 MINUTES AS NEEDED FOR CHEST PAIN FOR UP TO 3 DOSES, CALL 911 IF NO RELIEF AFTER 1 DOSE Patient not taking: Reported on 03/04/2022 02/27/22   Belva Crome, MD  ondansetron (ZOFRAN) 8 MG tablet Take 1 tablet (8 mg total) by mouth every 8 (eight) hours as needed for nausea or vomiting. 04/04/22   Bruning, Ashlyn, PA-C  pantoprazole (PROTONIX) 40 MG tablet Take 1 tablet (40 mg total) by mouth daily. 08/20/21   Belva Crome, MD  predniSONE (DELTASONE) 5 MG tablet Take 1 tablet (5 mg total) by mouth daily with breakfast. 02/03/22   Ladell Pier, MD  ranibizumab (LUCENTIS) 0.5 MG/0.05ML SOLN 0.5 mg by Intravitreal route every 6 (six) weeks. Left Eye ONLY 10/11/13   [provider]  romiPLOStim (NPLATE) 250 MCG injection Inject into the skin once a week.    [provider]  sertraline (ZOLOFT) 50 MG tablet Take 50 mg by mouth daily. 07/24/16   [provider]  tamsulosin (FLOMAX) 0.4 MG CAPS capsule Take 0.8 mg by mouth daily after supper.     [provider]  timolol (TIMOPTIC) 0.5 % ophthalmic solution Place 1 drop into the  right eye daily. 03/07/21   [provider]  traMADol (ULTRAM) 50 MG tablet Take 1 tablet (50 mg total) by mouth every 8 (eight) hours as needed. 04/14/22   Ladell Pier, MD    Physical Exam: Vitals:   04/28/22 1520  BP: 132/64  Pulse: 82  Resp: 18  Temp: 97.8 F (36.6 C)  TempSrc: Oral  SpO2: 99%   Gen Exam:Alert awake-not in any distress HEENT:atraumatic, normocephalic Chest: B/L clear to auscultation anteriorly CVS:S1S2 regular Abdomen:soft mildly tender in the pelvic area without any peritoneal signs. Extremities:no edema Neurology: Non focal Skin: no rash  Data Reviewed:    Latest Ref Rng & Units 04/28/2022   10:31 AM 04/22/2022    7:57 AM 04/21/2022   10:27 AM  CBC  WBC 4.0 - 10.5 K/uL 9.2  7.8  7.4   Hemoglobin 13.0 - 17.0 g/dL 9.4  8.9  8.7   Hematocrit 39.0 - 52.0 % 30.1  28.8  27.9   Platelets 150 - 400 K/uL 32  58  85        Latest Ref Rng & Units 04/28/2022   10:31 AM 04/22/2022    7:57 AM 04/21/2022   10:27 AM  BMP  Glucose 70 - 99 mg/dL 101  103  116   BUN 8 - 23 mg/dL '26  20  20   '$ Creatinine 0.61 - 1.24 mg/dL 1.12  1.08  1.10   Sodium 135 - 145 mmol/L 139  136  139   Potassium 3.5 - 5.1 mmol/L 4.0  4.0  3.8   Chloride 98 - 111 mmol/L 109  108  109   CO2 22 - 32 mmol/L '19  19  19   '$ Calcium 8.9 - 10.3 mg/dL 7.9  8.3  8.2      Assessment and Plan: Worsening generalized weakness Likely multifactorial-elderly frail male with metastatic prostate cancer-with poor oral intake/nausea and diarrhea for the past several days as likely the etiology.  Patient also appears to be on chronic steroids which probably puts him at risk of relative adrenal insufficiency that may have aggravated this current situation. Plan would be to hydrate with IV fluids-use antiemetics as needed for nausea, antimotility agents  for diarrhea, escalated steroid dosing, obtain PT/OT eval and see how he does.  Diarrhea Probably related to recent radiation to lumbar spine Seems  to have gotten somewhat better today-still  watery but only 2 episodes today. C. difficile studies negative Awaiting GI pathogen panel Use as needed Imodium for now Hopefully escalating steroid dosing may help with any radiation-induced colitis/enteritis.  Nausea Probably related to underlying cancer/recent radiation therapy Abdominal exam benign-having numerous loose watery stools-no indication for SBO Supportive care with antiemetics  Metastatic prostate cancer S/p palliative radiation to lumbar spine Supportive care for now Await further recommendations from oncology service  HTN Resume metoprolol starting tomorrow Follow BP trend and adjust accordingly  History of aortic stenosis s/p TAVR 2021  History of CVA without any residual deficits  Remote history of hairy cell leukemia-s/p splenectomy  History of thrombocytopenia Probably related to ITP ?  Worsening platelet count may be due to prostate cancer/with bone marrow involvement Will escalate prednisone dose for a few days to see if this helps. Gets Nplate as an outpatient-unclear to me when his next dosing is due. Will await to hear further recommendations from oncology team. Follow CBC  History of glaucoma Continue eyedrops  BPH Continue Flomax/finasteride  History of mood disorder Appears stable Continue Zoloft  GERD PPI  Chronic pain syndrome Related to cancer-mostly pelvic pain and bilateral thigh pain/back pain Takes as needed tramadol multiple times a day  Palliative care DNR Patient and spouse aware that apart from supportive care/gentle medical treatment-he is not a candidate for aggressive care Both aware that if his presenting symptoms are not controlled with supportive care-apart from transitioning to hospice care we really would not have any other options.  He is currently not interested in pursuing outpatient hospice care-but is willing to readdress this issue in a few days if he does not get  better.   Advance Care Planning:   Code Status: DNR   Consults: None  Family Communication: Spouse  Severity of Illness: The appropriate patient status for this patient is OBSERVATION. Observation status is judged to be reasonable and necessary in order to provide the required intensity of service to ensure the patient's safety. The patient's presenting symptoms, physical exam findings, and initial radiographic and laboratory data in the context of their medical condition is felt to place them at decreased risk for further clinical deterioration. Furthermore, it is anticipated that the patient will be medically stable for discharge from the hospital within 2 midnights of admission.   Author: Oren Binet, MD 04/28/2022 5:17 PM  For on call review [email or web address].

## 2022-04-29 ENCOUNTER — Inpatient Hospital Stay (HOSPITAL_COMMUNITY): Payer: Medicare Other

## 2022-04-29 DIAGNOSIS — Z66 Do not resuscitate: Secondary | ICD-10-CM | POA: Insufficient documentation

## 2022-04-29 DIAGNOSIS — R627 Adult failure to thrive: Secondary | ICD-10-CM | POA: Diagnosis present

## 2022-04-29 DIAGNOSIS — R197 Diarrhea, unspecified: Secondary | ICD-10-CM | POA: Diagnosis present

## 2022-04-29 DIAGNOSIS — R110 Nausea: Secondary | ICD-10-CM | POA: Diagnosis present

## 2022-04-29 DIAGNOSIS — E440 Moderate protein-calorie malnutrition: Secondary | ICD-10-CM | POA: Insufficient documentation

## 2022-04-29 LAB — CBC
HCT: 25.7 % — ABNORMAL LOW (ref 39.0–52.0)
Hemoglobin: 7.7 g/dL — ABNORMAL LOW (ref 13.0–17.0)
MCH: 24.4 pg — ABNORMAL LOW (ref 26.0–34.0)
MCHC: 30 g/dL (ref 30.0–36.0)
MCV: 81.3 fL (ref 80.0–100.0)
Platelets: 39 10*3/uL — ABNORMAL LOW (ref 150–400)
RBC: 3.16 MIL/uL — ABNORMAL LOW (ref 4.22–5.81)
RDW: 21.5 % — ABNORMAL HIGH (ref 11.5–15.5)
WBC: 10.7 10*3/uL — ABNORMAL HIGH (ref 4.0–10.5)
nRBC: 1 % — ABNORMAL HIGH (ref 0.0–0.2)

## 2022-04-29 LAB — COMPREHENSIVE METABOLIC PANEL
ALT: 9 U/L (ref 0–44)
AST: 19 U/L (ref 15–41)
Albumin: 2.1 g/dL — ABNORMAL LOW (ref 3.5–5.0)
Alkaline Phosphatase: 913 U/L — ABNORMAL HIGH (ref 38–126)
Anion gap: 9 (ref 5–15)
BUN: 23 mg/dL (ref 8–23)
CO2: 17 mmol/L — ABNORMAL LOW (ref 22–32)
Calcium: 7.2 mg/dL — ABNORMAL LOW (ref 8.9–10.3)
Chloride: 112 mmol/L — ABNORMAL HIGH (ref 98–111)
Creatinine, Ser: 1 mg/dL (ref 0.61–1.24)
GFR, Estimated: >60 mL/min (ref >60)
Glucose, Bld: 80 mg/dL (ref 70–99)
Potassium: 3.4 mmol/L — ABNORMAL LOW (ref 3.5–5.1)
Sodium: 138 mmol/L (ref 135–145)
Total Bilirubin: 1 mg/dL (ref 0.3–1.2)
Total Protein: 4.3 g/dL — ABNORMAL LOW (ref 6.5–8.1)

## 2022-04-29 MED ORDER — IOHEXOL 9 MG/ML PO SOLN
ORAL | Status: AC
  Filled 2022-04-29: qty 1000

## 2022-04-29 MED ORDER — IOHEXOL 9 MG/ML PO SOLN
500.0000 mL | ORAL | Status: AC
  Administered 2022-04-29: 500 mL via ORAL

## 2022-04-29 MED ORDER — BOOST / RESOURCE BREEZE PO LIQD CUSTOM
1.0000 | Freq: Three times a day (TID) | ORAL | Status: DC
  Administered 2022-04-29 – 2022-05-01 (×3): 1 via ORAL

## 2022-04-29 MED ORDER — POTASSIUM CHLORIDE CRYS ER 20 MEQ PO TBCR
40.0000 meq | EXTENDED_RELEASE_TABLET | Freq: Once | ORAL | Status: AC
  Administered 2022-04-29: 40 meq via ORAL
  Filled 2022-04-29: qty 2

## 2022-04-29 MED ORDER — IOHEXOL 9 MG/ML PO SOLN: 500.0000 mL | ORAL | Status: DC

## 2022-04-29 MED ORDER — PREDNISONE 5 MG PO TABS
10.0000 mg | ORAL_TABLET | Freq: Every day | ORAL | Status: DC
  Administered 2022-04-30: 10 mg via ORAL
  Filled 2022-04-29: qty 2

## 2022-04-29 MED ORDER — BANATROL TF EN LIQD
60.0000 mL | Freq: Two times a day (BID) | ENTERAL | Status: DC
  Administered 2022-04-29 – 2022-05-03 (×5): 60 mL via ORAL
  Filled 2022-04-29 (×11): qty 60

## 2022-04-29 MED ORDER — IOHEXOL 300 MG/ML  SOLN
80.0000 mL | Freq: Once | INTRAMUSCULAR | Status: AC | PRN
  Administered 2022-04-29: 80 mL via INTRAVENOUS

## 2022-04-29 MED ORDER — LACTATED RINGERS IV SOLN: INTRAVENOUS | Status: DC

## 2022-04-29 NOTE — Progress Notes (Signed)
Initial Nutrition Assessment  DOCUMENTATION CODES:   Non-severe (moderate) malnutrition in context of chronic illness  INTERVENTION:   -Boost Breeze po TID, each supplement provides 250 kcal and 9 grams of protein   -Banatrol fiber supplement BID, each provides 45 kcals, 2g protein and 5g soluble fiber to help with diarrhea.  NUTRITION DIAGNOSIS:   Moderate Malnutrition related to chronic illness, cancer and cancer related treatments as evidenced by moderate fat depletion, moderate muscle depletion.  GOAL:   Patient will meet greater than or equal to 90% of their needs  MONITOR:   PO intake, Supplement acceptance, Labs, Weight trends, I & O's  REASON FOR ASSESSMENT:   Consult Assessment of nutrition requirement/status  ASSESSMENT:   89 y.o. male with medical history significant of hormone refractory prostate cancer-completed a course of palliative radiation to his lumbar spine on 2/13. He subsequently started developing nausea, diarrhea and generalized weakness.  Patient sleeping in room, wife at bedside. Reports pt has been doing poorly since last radiation treatment. It did help with his pain but has caused him to develop diarrhea and nausea. Pt continues to have diarrhea following meals at this time. Pt has not been eating well, may take a few bites of things. Noted SLP to see patient for evaluation.  Pt refused regular Ensure/Boost drinks. Thinks pt may like Boost Breeze. Agreeable to Banatrol supplement to help with diarrhea as well.  Per weight records, pt has lost 15 lbs since 06/24/21 (9% wt loss x 10 months, insignificant for time frame).  Medications: Ferrous sulfate, Lactated ringers, Imodium, Zofran  Labs reviewed: Low K   NUTRITION - FOCUSED PHYSICAL EXAM:  Flowsheet Row Most Recent Value  Orbital Region Mild depletion  Upper Arm Region Moderate depletion  Thoracic and Lumbar Region Unable to assess  Buccal Region Moderate depletion  Temple Region  Moderate depletion  Clavicle Bone Region Moderate depletion  Clavicle and Acromion Bone Region Moderate depletion  Scapular Bone Region Unable to assess  Dorsal Hand Moderate depletion  Patellar Region Unable to assess  Anterior Thigh Region Unable to assess  Posterior Calf Region Unable to assess  Edema (RD Assessment) Moderate  [BLE]  Hair Reviewed  Eyes Unable to assess  Mouth Unable to assess  Skin Reviewed       Diet Order:   Diet Order             Diet regular Room service appropriate? Yes; Fluid consistency: Thin  Diet effective now                   EDUCATION NEEDS:   No education needs have been identified at this time  Skin:  Skin Assessment: Reviewed RN Assessment  Last BM:  2/27 - type 6  Height:   Ht Readings from Last 1 Encounters:  04/28/22 '5\' 5"'$  (1.651 m)    Weight:   Wt Readings from Last 1 Encounters:  04/28/22 66.2 kg    BMI:  Body mass index is 24.3 kg/m.  Estimated Nutritional Needs:   Kcal:  [id number]  Protein:  95-105g  Fluid:  1.9L/day  Clayton Bibles, MS, RD, LDN Inpatient Clinical Dietitian Contact information available via Am[employer]

## 2022-04-29 NOTE — Progress Notes (Signed)
IP PROGRESS NOTE  Subjective:   Mr. Saslow was admitted yesterday with nausea/vomiting and diarrhea.  He continues to have rectal urgency.  Bedside RN reports he had 2 bowel movements during the night.  Objective: Vital signs in last 24 hours: Blood pressure (!) 118/57, pulse (!) 50, temperature 99.7 F (37.6 C), temperature source Oral, resp. rate 14, height '5\' 5"'$  (1.651 m), weight 146 lb (66.2 kg), SpO2 96 %.  Intake/Output from previous day: 02/26 0701 - 02/27 0700 In: 0  Out: 200 [Urine:200]  Physical Exam:  HEENT: Mouth is dry, no thrush Lungs: Clear bilaterally Cardiac: Regular rate and rhythm Abdomen: Mild tenderness in the bilateral low abdomen, no mass, the abdomen is soft, no hepatomegaly Extremities: Trace lower leg edema bilaterally   Lab Results: Recent Labs    04/28/22 1031 04/29/22 0628  WBC 9.2 10.7*  HGB 9.4* 7.7*  HCT 30.1* 25.7*  PLT 32* 39*    BMET Recent Labs    04/28/22 1031 04/29/22 0628  NA 139 138  K 4.0 3.4*  CL 109 112*  CO2 19* 17*  GLUCOSE 101* 80  BUN 26* 23  CREATININE 1.12 1.00  CALCIUM 7.9* 7.2*    No results found for: "CEA1", "CEA", "[id number]", "CA125"  Studies/Results: No results found.  Medications: I have reviewed the patient's current medications.  Assessment/Plan: Thrombocytopenia Bone marrow biopsy 07/08/2016-no evidence of B-cell lymphoma, 40% cellular bone marrow with trilineage hematopoiesis, megakaryocytes present with normal morphology, normal cytogenetics, negative for BRAF mutation Flow cytometry 01/05/2009-no monoclonal B-cell or phenotypically abnormal T-cell population Prednisone starting 06/29/2019 Nplate 07/04/2019, [id number], Nplate 07/18/2019 Prednisone 20 mg daily beginning 07/25/2019 Platelets 11,000 on 08/15/2019, prednisone increased to 40 mg daily, Nplate resume D34-534  Prednisone decreased to 30 mg daily 08/25/2019, Nplate continued Prednisone continued at 30 mg daily 09/13/2019, weekly Nplate  continued Prednisone taper to 20 mg daily 09/23/2019, weekly Nplate continued Prednisone taper to 10 mg daily 09/30/2019, weekly Nplate continued Prednisone taper to 5 mg daily 10/21/2019, weekly Nplate continued Prednisone taper to 5 mg every other day for 5 doses then stop 11/11/2019, weekly Nplate continued Nplate last given D34-534 Promacta cost prohibitive Nplate beginning X33443, held 08/27/2020 and 09/03/2020 due to patient vacation Nplate resumed [id number] Nplate held [id number] secondary to an elevated platelet count Nplate resumed with dose reduction to 1 mcg/kg 10/09/2020 Nplate dose decreased to 0.5 mcg/kg 10/29/2020 Nplate dose increased to 1 mcg/kg 11/07/2020 Hairy cell leukemia 1982, status post splenectomy Coronary artery disease Aortic stenosis Liposarcoma at the right chest wall resected in 2013 Macular degeneration Hearing loss Basal cell carcinoma left cheek 05/24/2019 Left upper lobe nodule, faint FDG activity on PET at [facility] 05/31/2013 Left lung mass has been present since 2007, negative bronchoscopy December 2007 Status post TAVR procedure 09/06/2019 Admission with Pseudomonas urosepsis 09/16/2019 Acute left MCA distribution CVA 09/16/2019 Covid January 2022 Mild anemia, 1 of 3 stool Hemoccult cards positive for occult blood 10/14/2020, ferritin low 06/29/2020 Virtual colonoscopy 06/25/2021 -5 mm polypoid defect in the ascending colon 15.  Prostate cancer-sclerotic bone lesions/prostatic hypertrophy on CT 06/25/2021; Lupron every 3 months beginning 08/02/2021, bicalutamide 08/02/2021 for 2 weeks Abiraterone/prednisone 11/05/2021 Lupron every 3 months beginning 08/02/2021 12/16/2021 PSA higher (457) 12/26/2021 testosterone less than 3 Guardant360 01/13/2022-tumor mutation burden 8.61, MSI high not detected, NRAS G13D,PALB2 VUS Lupron every 3 months continued, Casodex 50 mg daily 02/10/2022 Radiation to lumbar spine 04/03/2022-04/16/2022 16.  Pneumonia 09/30/2021 17.  Admission 04/28/2022  with nausea/vomiting, diarrhea, and dehydration   [name]  has hormone refractory prostate cancer.  He has chronic thrombocytopenia felt to be due to ITP and prostate cancer.  He is now admitted with diarrhea and nausea for the past 2 weeks.  A  C. difficile toxin was negative.  A GI pathogen panel is pending.  He has abdominal tenderness on exam today.  I doubt the GI symptoms are directly related to the prostate cancer or recent lumbar radiation.  I am concerned he has a primary GI process.  I will recommend a CT abdomen/pelvis.  Mr Hammers has a poor prognosis and agrees to home hospice care.  He has experienced an acute change in his status over the past few weeks.  The goal during this hospital admission is to address acute issues and try to get him home with hospice care.    LOS: 1 day   Betsy Coder, MD   04/29/2022, 1:44 PM

## 2022-04-29 NOTE — Progress Notes (Signed)
OT Cancellation Note  Patient Details Name: [name] MRN: [id number] DOB: April 09, 1923   Cancelled Treatment:    Reason Eval/Treat Not Completed: Pain limiting ability to participate;Fatigue/lethargy limiting ability to participate. Attempted OT Evaluation at [phone]. Pt reporting fatigue and stomach pain preventing him from participating. RN notified for pain med request. Heard back from RN, asking therapy to hold for now. OT will try back next service date as able.  Home and vision info entered based on interview with pt and daughter.   Julien Girt 04/29/2022, 10:18 AM

## 2022-04-29 NOTE — Evaluation (Signed)
SLP Cancellation Note  Patient Details Name: [name] MRN: [id number] DOB: [birth year]   Cancelled treatment:       Reason Eval/Treat Not Completed: Other (comment) (notes indicate PT/OT advised to hold, oncology advising home with hospice, will continue efforts for eval)  Kathleen Lime, MS [facility] SLP Acute Rehab Services Office [phone]  Macario Golds 04/29/2022, 4:08 PM

## 2022-04-29 NOTE — Progress Notes (Addendum)
PROGRESS NOTE    [name]  [id number] DOB: Nov 02, 1923 DOA: 04/28/2022 PCP: Douglas Lopes, MD     Brief Narrative:  [name] is a 89 y.o. male with medical history significant of hormone refractory prostate cancer-completed a course of palliative radiation to his lumbar spine on 2/13. He subsequently started developing nausea, diarrhea and generalized weakness.  He was sent to the hospital as a direct admit by his oncologist.   History is gathered from daughter at bedside.  Patient has been independently living at home with his spouse.  After completing palliative radiation to his lumbar spine, he started to develop nausea, vomiting, diarrhea.  Initially, thought it was a stomach bug, but has not been able to keep any nutrition down for about 10 days.  Continues to have loose stools.  New events last 24 hours / Subjective: Appears to be very weak.  Reportedly had 2 loose stools yesterday.  Was finally able to eat about 7 bites of oatmeal this morning which is the most he has had in days.  Assessment & Plan:    Principal Problem:   Failure to thrive in adult Active Problems:   Hypertension   Dyslipidemia   Chronic ITP (idiopathic thrombocytopenia) (HCC)   Paroxysmal atrial fibrillation (HCC)   Prostate cancer metastatic to bone (HCC)   Dehydration   Nausea   Diarrhea   DNR (do not resuscitate)   Failure to thrive with generalized weakness, nausea, diarrhea, poor oral intake -Multifactorial in elderly male with metastatic prostate cancer status post radiation therapy -Continue IV fluid -Imodium for diarrhea.  C. difficile is negative -Supportive care -Dietitian consult for poor oral intake -Discussed with Dr. Benay [name] today. Will order CT A/P   Metastatic prostate cancer -Followed by Dr. Benay [name]  Chronic ITP -Followed by Dr. Benay [name] -Prednisone   Paroxysmal A-fib HTN -Lopressor   BPH -Flomax, Proscar  Depression -Zoloft  Chronic  pain -Tramadol  HLD -Lipitor   Hypokalemia -Replace  DVT prophylaxis:  SCDs Start: 04/28/22 1714  Code Status: D NR Family Communication: Daughter at bedside Disposition Plan:  Status is: Inpatient Remains inpatient appropriate because: poor PO    Antimicrobials:  Anti-infectives (From admission, onward)    None        Objective: Vitals:   04/28/22 1757 04/28/22 1951 04/28/22 2354 04/29/22 0410  BP:  (!) 134/55 131/61 (!) 118/57  Pulse:  91 94 (!) 50  Resp:  '16 16 14  '$ Temp:  99.9 F (37.7 C) 99.1 F (37.3 C) 99.7 F (37.6 C)  TempSrc:  Oral Oral Oral  SpO2:  97% 97% 96%  Weight: 66.2 kg     Height: '5\' 5"'$  (1.651 m)       Intake/Output Summary (Last 24 hours) at 04/29/2022 1054 Last data filed at 04/29/2022 0417 Gross per 24 hour  Intake 0 ml  Output 200 ml  Net -200 ml   Filed Weights   04/28/22 1757  Weight: 66.2 kg    Examination:  General exam: Appears calm and comfortable, hard of hearing, frail Respiratory system: Clear to auscultation. Respiratory effort normal. No respiratory distress. No conversational dyspnea.  Cardiovascular system: S1 & S2 heard. No murmurs. No pedal edema. Gastrointestinal system: Abdomen is nondistended, soft Central nervous system: Alert  Extremities: Symmetric in appearance  Skin: No rashes, lesions or ulcers on exposed skin   Data Reviewed: I have personally reviewed following labs and imaging studies  CBC: Recent Labs  Lab 04/28/22 1031 04/29/22 [id number]  WBC 9.2 10.7*  NEUTROABS 3.5  --   HGB 9.4* 7.7*  HCT 30.1* 25.7*  MCV 80.5 81.3  PLT 32* 39*   Basic Metabolic Panel: Recent Labs  Lab 04/28/22 1031 04/29/22 0628  NA 139 138  K 4.0 3.4*  CL 109 112*  CO2 19* 17*  GLUCOSE 101* 80  BUN 26* 23  CREATININE 1.12 1.00  CALCIUM 7.9* 7.2*   GFR: Estimated Creatinine Clearance: 37.6 mL/min (by C-G formula based on SCr of 1 mg/dL). Liver Function Tests: Recent Labs  Lab 04/28/22 1031 04/29/22 0628   AST 28 19  ALT 10 9  ALKPHOS 1,195* 913*  BILITOT 0.5 1.0  PROT 5.3* 4.3*  ALBUMIN 3.1* 2.1*   No results for input(s): "LIPASE", "AMYLASE" in the last 168 hours. No results for input(s): "AMMONIA" in the last 168 hours. Coagulation Profile: No results for input(s): "INR", "PROTIME" in the last 168 hours. Cardiac Enzymes: No results for input(s): "CKTOTAL", "CKMB", "CKMBINDEX", "TROPONINI" in the last 168 hours. BNP (last 3 results) No results for input(s): "PROBNP" in the last 8760 hours. HbA1C: No results for input(s): "HGBA1C" in the last 72 hours. CBG: No results for input(s): "GLUCAP" in the last 168 hours. Lipid Profile: No results for input(s): "CHOL", "HDL", "LDLCALC", "TRIG", "CHOLHDL", "LDLDIRECT" in the last 72 hours. Thyroid Function Tests: No results for input(s): "TSH", "T4TOTAL", "FREET4", "T3FREE", "THYROIDAB" in the last 72 hours. Anemia Panel: No results for input(s): "VITAMINB12", "FOLATE", "FERRITIN", "TIBC", "IRON", "RETICCTPCT" in the last 72 hours. Sepsis Labs: No results for input(s): "PROCALCITON", "LATICACIDVEN" in the last 168 hours.  Recent Results (from the past 240 hour(s))  C difficile quick screen w PCR reflex     Status: None   Collection Time: 04/28/22 11:49 AM   Specimen: STOOL  Result Value Ref Range Status   C Diff antigen NEGATIVE NEGATIVE Final   C Diff toxin NEGATIVE NEGATIVE Final   C Diff interpretation No C. difficile detected.  Final    Comment: Performed at [facility], [place] [address], [place], [place] [address]      Radiology Studies: No results found.    Scheduled Meds:  atorvastatin  20 mg Oral q1800   ferrous sulfate  325 mg Oral Q breakfast   finasteride  5 mg Oral Daily   latanoprost  1 drop Both Eyes QHS   metoprolol tartrate  12.5 mg Oral BID   pantoprazole  40 mg Oral Daily   potassium chloride  40 mEq Oral Once   predniSONE  40 mg Oral Q breakfast   sertraline  50 mg Oral Daily   tamsulosin  0.8 mg  Oral QPC supper   timolol  1 drop Right Eye Daily   Continuous Infusions:  lactated ringers       LOS: 1 day   Time spent: 25 minutes   Douglas Phi, DO [facility] 04/29/2022, 10:54 AM   Available via Epic secure chat 7am-7pm After these hours, please refer to coverage provider listed on [email or web address]

## 2022-04-29 NOTE — Progress Notes (Signed)
PT Cancellation Note  Patient Details Name: [name] MRN: [id number] DOB: Mar 30, 1923   Cancelled Treatment:    Reason Eval/Treat Not Completed: Medical issues which prohibited therapy (RN asking therapy be held for now. Will check back as schedule allows.)    Doreatha Massed, PT Acute Rehabilitation  Office: [phone]

## 2022-04-30 ENCOUNTER — Other Ambulatory Visit: Payer: Self-pay | Admitting: Nurse Practitioner

## 2022-04-30 DIAGNOSIS — R627 Adult failure to thrive: Secondary | ICD-10-CM | POA: Diagnosis not present

## 2022-04-30 DIAGNOSIS — C61 Malignant neoplasm of prostate: Secondary | ICD-10-CM

## 2022-04-30 DIAGNOSIS — D693 Immune thrombocytopenic purpura: Secondary | ICD-10-CM

## 2022-04-30 LAB — GI PATHOGEN PANEL BY PCR, STOOL

## 2022-04-30 LAB — BASIC METABOLIC PANEL
Anion gap: 10 (ref 5–15)
BUN: 26 mg/dL — ABNORMAL HIGH (ref 8–23)
CO2: 17 mmol/L — ABNORMAL LOW (ref 22–32)
Calcium: 7.6 mg/dL — ABNORMAL LOW (ref 8.9–10.3)
Chloride: 110 mmol/L (ref 98–111)
Creatinine, Ser: 1 mg/dL (ref 0.61–1.24)
GFR, Estimated: >60 mL/min (ref >60)
Glucose, Bld: 110 mg/dL — ABNORMAL HIGH (ref 70–99)
Potassium: 3.8 mmol/L (ref 3.5–5.1)
Sodium: 137 mmol/L (ref 135–145)

## 2022-04-30 LAB — CBC WITH DIFFERENTIAL/PLATELET
Abs Immature Granulocytes: 1.7 10*3/uL — ABNORMAL HIGH (ref 0.00–0.07)
Basophils Absolute: 0 10*3/uL (ref 0.0–0.1)
Basophils Relative: 0 %
Eosinophils Absolute: 0 10*3/uL (ref 0.0–0.5)
Eosinophils Relative: 0 %
HCT: 27.2 % — ABNORMAL LOW (ref 39.0–52.0)
Hemoglobin: 8.1 g/dL — ABNORMAL LOW (ref 13.0–17.0)
Lymphocytes Relative: 7 %
Lymphs Abs: 0.7 10*3/uL (ref 0.7–4.0)
MCH: 24.9 pg — ABNORMAL LOW (ref 26.0–34.0)
MCHC: 29.8 g/dL — ABNORMAL LOW (ref 30.0–36.0)
MCV: 83.7 fL (ref 80.0–100.0)
Metamyelocytes Relative: 4 %
Monocytes Absolute: 1.9 10*3/uL — ABNORMAL HIGH (ref 0.1–1.0)
Monocytes Relative: 20 %
Myelocytes: 14 %
Neutro Abs: 5.2 10*3/uL (ref 1.7–7.7)
Neutrophils Relative %: 55 %
Platelets: 38 10*3/uL — ABNORMAL LOW (ref 150–400)
RBC: 3.25 MIL/uL — ABNORMAL LOW (ref 4.22–5.81)
RDW: 21.7 % — ABNORMAL HIGH (ref 11.5–15.5)
WBC: 9.4 10*3/uL (ref 4.0–10.5)
nRBC: 1 % — ABNORMAL HIGH (ref 0.0–0.2)

## 2022-04-30 LAB — MAGNESIUM: Magnesium: 1.7 mg/dL (ref 1.7–2.4)

## 2022-04-30 MED ORDER — PREDNISONE 5 MG PO TABS
5.0000 mg | ORAL_TABLET | Freq: Every day | ORAL | Status: DC
  Administered 2022-05-01 – 2022-05-09 (×9): 5 mg via ORAL
  Filled 2022-04-30 (×9): qty 1

## 2022-04-30 NOTE — Progress Notes (Signed)
IP PROGRESS NOTE  Subjective:   [name] reports feeling much better this morning.  No nausea.  Diarrhea is significantly improved.  No pain.  His wife is at the bedside.  Objective: Vital signs in last 24 hours: Blood pressure (!) 112/51, pulse 64, temperature 98.4 F (36.9 C), temperature source Oral, resp. rate 16, height '5\' 5"'$  (1.651 m), weight 146 lb (66.2 kg), SpO2 95 %.  Intake/Output from previous day: 02/27 0701 - 02/28 0700 In: 555.4 [I.V.:555.4] Out: -   Physical Exam:  HEENT: No thrush Lungs: Clear bilaterally Cardiac: Regular rate and rhythm with premature beats Abdomen: Soft and nontender, no hepatomegaly Extremities: Trace ankle edema on the left greater than right Neurologic: Alert and oriented   Lab Results: Recent Labs    04/29/22 0628 04/30/22 0627  WBC 10.7* 9.4  HGB 7.7* 8.1*  HCT 25.7* 27.2*  PLT 39* 38*    BMET Recent Labs    04/29/22 0628 04/30/22 0627  NA 138 137  K 3.4* 3.8  CL 112* 110  CO2 17* 17*  GLUCOSE 80 110*  BUN 23 26*  CREATININE 1.00 1.00  CALCIUM 7.2* 7.6*    No results found for: "CEA1", "CEA", "[id number]", "CA125"  Studies/Results: CT ABDOMEN PELVIS W CONTRAST  Result Date: 04/29/2022 CLINICAL DATA:  Abdominal pain.  History of prostate cancer. EXAM: CT ABDOMEN AND PELVIS WITH CONTRAST TECHNIQUE: Multidetector CT imaging of the abdomen and pelvis was performed using the standard protocol following bolus administration of intravenous contrast. RADIATION DOSE REDUCTION: This exam was performed according to the departmental dose-optimization program which includes automated exposure control, adjustment of the mA and/or kV according to patient size and/or use of iterative reconstruction technique. CONTRAST:  74m OMNIPAQUE IOHEXOL 300 MG/ML  SOLN COMPARISON:  Virtual colonoscopy 06/25/2021 FINDINGS: Lower chest: Chronic interstitial opacities in the lung bases appear unchanged. Left perihilar/left upper lobe pulmonary nodule  present measuring 3.8 x 3.2 cm. Hepatobiliary: No focal liver abnormality is seen. No gallstones, gallbladder wall thickening, or biliary dilatation. Pancreas: Scattered pancreatic calcifications are present compatible with chronic pancreatitis. No acute inflammation. Spleen: Surgically absent. Adrenals/Urinary Tract: There is no hydronephrosis or perinephric fluid. There is a 2.4 cm cyst in the right kidney. The adrenal glands and bladder are within normal limits. Stomach/Bowel: Stomach is within normal limits. No evidence of bowel wall thickening, distention, or inflammatory changes. The appendix is not visualized. Vascular/Lymphatic: Aortic atherosclerosis. No enlarged abdominal or pelvic lymph nodes. Reproductive: Prostate gland is enlarged, unchanged. Other: No abdominal wall hernia or abnormality. No abdominopelvic ascites. There is mild body wall edema. Musculoskeletal: Diffuse osseous metastatic disease is progressed no acute fractures are seen. IMPRESSION: 1. No acute localizing process in the abdomen or pelvis. 2. Diffuse osseous metastatic disease is progressed. 3. Left perihilar/left upper lobe pulmonary nodule worrisome for metastatic disease. 4. Mild body wall edema. 5. Stable right renal cysts.  No follow-up imaging recommended. Aortic Atherosclerosis (ICD10-I70.0). Electronically Signed   By: ARonney AstersM.D.   On: 04/29/2022 21:58    Medications: I have reviewed the patient's current medications.  Assessment/Plan: Thrombocytopenia Bone marrow biopsy 07/08/2016-no evidence of B-cell lymphoma, 40% cellular bone marrow with trilineage hematopoiesis, megakaryocytes present with normal morphology, normal cytogenetics, negative for BRAF mutation Flow cytometry 01/05/2009-no monoclonal B-cell or phenotypically abnormal T-cell population Prednisone starting 06/29/2019 Nplate 07/04/2019, [id number] Nplate 07/18/2019 Prednisone 20 mg daily beginning 07/25/2019 Platelets 11,000 on 08/15/2019, prednisone  increased to 40 mg daily, Nplate resume [id number] Prednisone decreased to 30  mg daily 08/25/2019, Nplate continued Prednisone continued at 30 mg daily 09/13/2019, weekly Nplate continued Prednisone taper to 20 mg daily 09/23/2019, weekly Nplate continued Prednisone taper to 10 mg daily 09/30/2019, weekly Nplate continued Prednisone taper to 5 mg daily 10/21/2019, weekly Nplate continued Prednisone taper to 5 mg every other day for 5 doses then stop 11/11/2019, weekly Nplate continued Nplate last given D34-534 Promacta cost prohibitive Nplate beginning X33443, held 08/27/2020 and 09/03/2020 due to patient vacation Nplate resumed X33443 Nplate held [id number] secondary to an elevated platelet count Nplate resumed with dose reduction to 1 mcg/kg 10/09/2020 Nplate dose decreased to 0.5 mcg/kg 10/29/2020 Nplate dose increased to 1 mcg/kg 11/07/2020 Hairy cell leukemia 1982, status post splenectomy Coronary artery disease Aortic stenosis 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  Prostate cancer-sclerotic bone lesions/prostatic hypertrophy on CT 06/25/2021; Lupron every 3 months beginning 08/02/2021, bicalutamide 08/02/2021 for 2 weeks Abiraterone/prednisone 11/05/2021 Lupron every 3 months beginning 08/02/2021 12/16/2021 PSA higher (457) 12/26/2021 testosterone less than 3 Guardant360 01/13/2022-tumor mutation burden 8.61, MSI high not detected, NRAS  G13D,PALB2 VUS Lupron every 3 months continued, Casodex 50 mg daily 02/10/2022 Radiation to lumbar spine 04/03/2022-04/16/2022 16.  Pneumonia 09/30/2021 17.  Admission 04/28/2022 with nausea/vomiting, diarrhea, and dehydration CT abdomen/pelvis 04/29/2022-no acute finding, diffuse bone metastases, left lung mass (chronic)   Mr. Fosselman has hormone refractory prostate cancer.  He has chronic thrombocytopenia felt to be due to ITP and prostate cancer.  He is now admitted with diarrhea and nausea for the past 2 weeks.  A  C. difficile toxin was negative.  A GI pathogen panel from 04/28/2022 is still pending.  A CT abdomen/pelvis yesterday showed no acute finding.  Mr. Weaverling appears much improved today.  He is alert, diarrhea and nausea are much improved.  He wants to begin a diet.  Mr Brisbin has a poor long-term prognosis.  He would like to hold on a hospice referral for now.  His goal is to become ambulatory and return home with his wife.  Recommendations: Increase diet Out of bed, physical therapy Home when ambulatory and taking a diet Outpatient follow-up will be scheduled at the Cancer center Please call oncology as needed, I will be out until 05/07/2022   LOS: 2 days   Betsy Coder, MD   04/30/2022, 8:04 AM

## 2022-04-30 NOTE — Evaluation (Signed)
Physical Therapy Evaluation Patient Details Name: [name] MRN: [id number] DOB: 1923/08/05 Today's Date: 04/30/2022  History of Present Illness  [name] is a 89 y.o. male Admitted for failure to thrive. PMH: hormone refractory prostate cancer-completed a course of palliative radiation to his lumbar spine on 2/13-he subsequently started developing nausea, diarrhea and generalized weakness.  Clinical Impression  Pt admitted with above diagnosis. Pt from home with spouse, needing increased assist and use of RW recently due to progressive weakness. Pt standing with OT upon arrival, ambulates 65 ft with RW min guard in hallway after completing OT evaluation, needing seated rest break after amb and rolled back to room in recliner. Pt demo decreased cadence, flexed trunk, minimal foot clearance, denies pain and no overt LOB. Recommend HHPT to progress strength, balance and gait as able and spouse at bedside in agreement.  Pt currently with functional limitations due to the deficits listed below (see PT Problem List). Pt will benefit from skilled PT to increase their independence and safety with mobility to allow discharge to the venue listed below.          Recommendations for follow up therapy are one component of a multi-disciplinary discharge planning process, led by the attending physician.  Recommendations may be updated based on patient status, additional functional criteria and insurance authorization.  Follow Up Recommendations Home health PT      Assistance Recommended at Discharge Frequent or constant Supervision/Assistance  Patient can return home with the following  A little help with walking and/or transfers;A little help with bathing/dressing/bathroom;Assistance with cooking/housework;Assist for transportation;Help with stairs or ramp for entrance    Equipment Recommendations None recommended by PT  Recommendations for Other Services       Functional Status Assessment  Patient has had a recent decline in their functional status and demonstrates the ability to make significant improvements in function in a reasonable and predictable amount of time.     Precautions / Restrictions Precautions Precautions: Fall Restrictions Weight Bearing Restrictions: No      Mobility  Bed Mobility  General bed mobility comments: standing with OT upon arrival    Transfers Overall transfer level: Needs assistance Equipment used: Rolling walker (2 wheels) Transfers: Sit to/from Stand Sit to Stand: Min guard  General transfer comment: cues for hand placement, increasead time    Ambulation/Gait Ambulation/Gait assistance: Min guard Gait Distance (Feet): 65 Feet Assistive device: Rolling walker (2 wheels) Gait Pattern/deviations: Step-to pattern, Decreased stride length, Trunk flexed Gait velocity: decreased  General Gait Details: slow, step through gait pattern with trunk flexed, no overt LOB, minimal bil foot clearance, increasead time with turns  Financial trader Rankin (Stroke Patients Only)       Balance Overall balance assessment: Mild deficits observed, not formally tested        Pertinent Vitals/Pain Pain Assessment Pain Assessment: No/denies pain    Home Living Family/patient expects to be discharged to:: Private residence Living Arrangements: Spouse/significant other Available Help at Discharge: Family Type of Home: House Home Access: Stairs to enter Entrance Stairs-Rails: Left Entrance Stairs-Number of Steps: 3+1   Home Layout: Two level;Able to live on main level with bedroom/bathroom;Full bath on main level Home Equipment: Rolling Walker (2 wheels);Hand held shower head;Shower seat - built in      Prior Function Prior Level of Function : History of Falls (last six months)    Mobility Comments: 1  fall 3-4 weeks ago and started using a RW since then. Prior to that, ambulting  independently. ADLs Comments: has needed help with LB dressing in the last two weeks due to weakness     Hand Dominance   Dominant Hand: Right    Extremity/Trunk Assessment   Upper Extremity Assessment Upper Extremity Assessment: Defer to OT evaluation    Lower Extremity Assessment Lower Extremity Assessment: Generalized weakness (AROM WFL, strength grossly 3+/5)    Cervical / Trunk Assessment Cervical / Trunk Assessment: Kyphotic  Communication   Communication: HOH  Cognition Arousal/Alertness: Awake/alert Behavior During Therapy: WFL for tasks assessed/performed Overall Cognitive Status: Within Functional Limits for tasks assessed         General Comments      Exercises     Assessment/Plan    PT Assessment Patient needs continued PT services  PT Problem List Decreased strength;Decreased activity tolerance;Decreased balance       PT Treatment Interventions DME instruction;Gait training;Stair training;Functional mobility training;Therapeutic activities;Therapeutic exercise;Balance training;Neuromuscular re-education;Patient/family education    PT Goals (Current goals can be found in the Care Plan section)  Acute Rehab PT Goals Patient Stated Goal: return home PT Goal Formulation: With patient/family Time For Goal Achievement: 05/14/22 Potential to Achieve Goals: Good    Frequency Min 3X/week     Co-evaluation               AM-PAC PT "6 Clicks" Mobility  Outcome Measure Help needed turning from your back to your side while in a flat bed without using bedrails?: A Little Help needed moving from lying on your back to sitting on the side of a flat bed without using bedrails?: A Little Help needed moving to and from a bed to a chair (including a wheelchair)?: A Little Help needed standing up from a chair using your arms (e.g., wheelchair or bedside chair)?: A Little Help needed to walk in hospital room?: A Little Help needed climbing 3-5 steps with a  railing? : A Little 6 Click Score: 18    End of Session   Activity Tolerance: Patient tolerated treatment well Patient left: with call bell/phone within reach;with bed alarm set;in chair;with family/visitor present;with nursing/sitter in room Nurse Communication: Mobility status PT Visit Diagnosis: Unsteadiness on feet (R26.81);Other abnormalities of gait and mobility (R26.89);Muscle weakness (generalized) (M62.81)    Time: [id number] PT Time Calculation (min) (ACUTE ONLY): 10 min   Charges:   PT Evaluation $PT Eval Low Complexity: 1 Low           Tori Sibley Rolison PT, DPT 04/30/22, 11:12 AM

## 2022-04-30 NOTE — TOC Initial Note (Addendum)
Transition of Care [facility]) - Initial/Assessment Note    Patient Details  Name: [name] MRN: [id number] Date of Birth: Mar 05, 1923  Transition of Care [facility]) CM/SW Contact:    Douglas Ingles, RN Phone Number:[phone]  04/30/2022, 2:54 PM  Clinical Narrative:                 [facility] acknowledges consult for Home hospice after discharge. Cm at bedside daughter is present. Daughter attempts to call patients wife but no answer. Daughter states that the family is not sure of the decision would be hospice or palliative. Daughter is concerned that MD did not discuss the referral and what would be best for the patient. CM reached out to wife [name] to discuss referral. Wife does not answer message has been left CM will follow up.   TOC referral is for home Hospice after discharge but MD notes indicates that family is not willing to have Hospice at this time. CM will reach out to MD for clarification before moving any further with this referral.   Expected Discharge Plan: Home w Hospice Care Barriers to Discharge: Continued Medical Work up   Patient Goals and CMS Choice            Expected Discharge Plan and Services In-house Referral: NA Discharge Planning Services: CM Consult   Living arrangements for the past 2 months: Single Family Home                 DME Arranged: N/A                    Prior Living Arrangements/Services Living arrangements for the past 2 months: Single Family Home Lives with:: Spouse                   Activities of Daily Living Home Assistive Devices/Equipment: None ADL Screening (condition at time of admission) Patient's cognitive ability adequate to safely complete daily activities?: Yes Is the patient deaf or have difficulty hearing?: Yes Does the patient have difficulty seeing, even when wearing glasses/contacts?: No Does the patient have difficulty concentrating, remembering, or making decisions?: No Patient able to express need for  assistance with ADLs?: Yes Does the patient have difficulty dressing or bathing?: Yes Independently performs ADLs?: No Communication: Independent Dressing (OT): Needs assistance Is this a change from baseline?: Change from baseline, expected to last <3days Grooming: Needs assistance Is this a change from baseline?: Change from baseline, expected to last <3 days Feeding: Independent Bathing: Needs assistance Is this a change from baseline?: Change from baseline, expected to last <3 days Toileting: Needs assistance Is this a change from baseline?: Change from baseline, expected to last <3 days In/Out Bed: Needs assistance Is this a change from baseline?: Change from baseline, expected to last <3 days Walks in Home: Needs assistance Is this a change from baseline?: Change from baseline, expected to last <3 days Does the patient have difficulty walking or climbing stairs?: Yes Weakness of Legs: Both Weakness of Arms/Hands: Both  Permission Sought/Granted                  Emotional Assessment              Admission diagnosis:  Dehydration [E86.0] Patient Active Problem List   Diagnosis Date Noted   Failure to thrive in adult 04/29/2022   Nausea 04/29/2022   Diarrhea 04/29/2022   DNR (do not resuscitate) 04/29/2022   Malnutrition of moderate degree 04/29/2022   Dehydration  04/28/2022   Lumbar pain 08/15/2021   Prostate cancer metastatic to bone ([place]) 07/30/2021   Epiretinal membrane (ERM), bilateral 10/30/2020   History of bacteremia    Pseudomonas aeruginosa infection 09/17/2019   Paroxysmal atrial fibrillation ([place]) 09/17/2019   Stroke (cerebrum) ([place]) 09/16/2019   Urinary tract infection without hematuria    Acute on chronic diastolic heart failure ([place]) 09/06/2019   Bifascicular block 09/06/2019   S/P TAVR (transcatheter aortic valve replacement) 09/06/2019   Severe aortic stenosis    Chronic ITP (idiopathic thrombocytopenia) (H[facility]) 07/04/2019   Chronic  eczematous otitis externa of both ears 01/15/2016   Sensorineural hearing loss (SNHL) of both ears 01/15/2016   Prostate nodule 03/09/2015   CAD (coronary artery disease)    GERD (gastroesophageal reflux disease)    Hypertension    Dyslipidemia    Colon cancer ([place])    Solitary pulmonary nodule 02/11/2013   Leukemic reticuloendotheliosis, extranodal and solid organ sites [facility]) 01/03/2013   Shortness of breath 01/03/2013   Glaucoma 11/27/2011   Personal history of other malignant neoplasm of skin 10/14/2011   Elevated PSA 09/12/2011   Liposarcoma, well differentiated type ([place]) 09/12/2011   Traction detachment of retina 11/18/2010   Exudative age-related macular degeneration ([place]) 04/16/2010   Epiretinal membrane 04/16/2010   Vitreous degeneration 04/16/2010   PCP:  Douglas Lopes, MD Pharmacy:   [facility] [place], [place] [address] [place] [address] Phone: [phone] Fax: [phone]     Social Determinants of Health (SDOH) Social History: SDOH Screenings   Food Insecurity: No Food Insecurity (04/28/2022)  Housing: Low Risk  (04/28/2022)  Transportation Needs: No Transportation Needs (04/28/2022)  Utilities: Not At Risk (04/28/2022)  Depression (PHQ2-9): Low Risk  (03/21/2020)  Tobacco Use: Medium Risk (04/28/2022)   SDOH Interventions:     Readmission Risk Interventions    04/30/2022    2:51 PM 09/09/2019   11:06 AM  Readmission Risk Prevention Plan  Post Dischage Appt  Complete  Medication Screening  Complete  Transportation Screening Complete Complete  PCP or Specialist Appt within 5-7 Days Complete   Home Care Screening Complete   Medication Review (RN CM) Complete

## 2022-04-30 NOTE — Evaluation (Signed)
Occupational Therapy Evaluation Patient Details Name: [name] MRN: [id number] DOB: 05-Dec-1923 Today's Date: 04/30/2022   History of Present Illness [name] is a 89 y.o. male with medical history significant of hormone refractory prostate cancer-completed a course of palliative radiation to his lumbar spine on 2/13-he subsequently started developing nausea, diarrhea and generalized weakness.Admitted for failure to thrive.   Clinical Impression   [name] is a 89 year old man who presents with generalized weakness and decreased activity tolerance. He is overall min guard for ambulation with walker and min assist for ADLs. He reports weakness over the last two weeks and needing increased assist from his wife and needing to use a walker. He reports feeling better today. Do not expect he will need OT at discharge but will follow acutely to maintain functional abilities.      Recommendations for follow up therapy are one component of a multi-disciplinary discharge planning process, led by the attending physician.  Recommendations may be updated based on patient status, additional functional criteria and insurance authorization.   Follow Up Recommendations  No OT follow up     Assistance Recommended at Discharge Intermittent Supervision/Assistance  Patient can return home with the following A little help with bathing/dressing/bathroom;A lot of help with bathing/dressing/bathroom;Assistance with cooking/housework;Help with stairs or ramp for entrance;Assist for transportation    Functional Status Assessment  Patient has had a recent decline in their functional status and demonstrates the ability to make significant improvements in function in a reasonable and predictable amount of time.  Equipment Recommendations  None recommended by OT    Recommendations for Other Services       Precautions / Restrictions Precautions Precautions: Fall Restrictions Weight Bearing  Restrictions: No      Mobility Bed Mobility Overal bed mobility: Needs Assistance Bed Mobility: Supine to Sit     Supine to sit: Supervision, HOB elevated          Transfers Overall transfer level: Needs assistance Equipment used: Rolling walker (2 wheels) Transfers: Sit to/from Stand Sit to Stand: Min guard           General transfer comment: Min guard with walker and increased time      Balance Overall balance assessment: Mild deficits observed, not formally tested                                         ADL either performed or assessed with clinical judgement   ADL Overall ADL's : Needs assistance/impaired Eating/Feeding: Set up   Grooming: Standing;Min guard   Upper Body Bathing: Set up;Sitting   Lower Body Bathing: Minimal assistance;Sit to/from stand   Upper Body Dressing : Set up;Sitting   Lower Body Dressing: Minimal assistance;Sit to/from stand   Toilet Transfer: Rolling walker (2 wheels);Regular Toilet;Min guard;Grab bars   Toileting- Clothing Manipulation and Hygiene: Minimal assistance;Sit to/from stand       Functional mobility during ADLs: Min guard;Rolling walker (2 wheels)       Vision Patient Visual Report: No change from baseline       Perception     Praxis      Pertinent Vitals/Pain Pain Assessment Pain Assessment: No/denies pain     Hand Dominance Right   Extremity/Trunk Assessment Upper Extremity Assessment Upper Extremity Assessment: Overall WFL for tasks assessed   Lower Extremity Assessment Lower Extremity Assessment: Defer to PT evaluation  Cervical / Trunk Assessment Cervical / Trunk Assessment: Kyphotic   Communication Communication Communication: HOH   Cognition Arousal/Alertness: Awake/alert Behavior During Therapy: WFL for tasks assessed/performed Overall Cognitive Status: Within Functional Limits for tasks assessed                                       General  Comments       Exercises     Shoulder Instructions      Home Living Family/patient expects to be discharged to:: Private residence Living Arrangements: Spouse/significant other Available Help at Discharge: Family Type of Home: House Home Access: Stairs to enter Technical brewer of Steps: 3+1 Entrance Stairs-Rails: Left Home Layout: Two level;Able to live on main level with bedroom/bathroom;Full bath on main level     Bathroom Shower/Tub: Occupational psychologist: Handicapped height     Home Equipment: Conservation officer, nature (2 wheels);Hand held shower head;Shower seat - built in          Prior Functioning/Environment Prior Level of Function : History of Falls (last six months)             Mobility Comments: 1 fall 3-4 weeks ago and started using a RW since then. Prior to that, ambulting independently. ADLs Comments: has needed help with LB dressing in the last two weeks due to weakness        OT Problem List: Decreased strength;Decreased activity tolerance;Impaired balance (sitting and/or standing)      OT Treatment/Interventions: Self-care/ADL training;Therapeutic exercise;DME and/or AE instruction;Therapeutic activities;Balance training;Patient/family education    OT Goals(Current goals can be found in the care plan section) Acute Rehab OT Goals Patient Stated Goal: get stronger OT Goal Formulation: With patient Time For Goal Achievement: 05/14/22 Potential to Achieve Goals: Good  OT Frequency: Min 2X/week    Co-evaluation              AM-PAC OT "6 Clicks" Daily Activity     Outcome Measure Help from another person eating meals?: A Little Help from another person taking care of personal grooming?: A Little Help from another person toileting, which includes using toliet, bedpan, or urinal?: A Little Help from another person bathing (including washing, rinsing, drying)?: A Little Help from another person to put on and taking off regular upper  body clothing?: A Little Help from another person to put on and taking off regular lower body clothing?: A Little 6 Click Score: 18   End of Session Equipment Utilized During Treatment: Rolling walker (2 wheels) Nurse Communication: Mobility status  Activity Tolerance: Patient tolerated treatment well Patient left: Other (comment) (left in care of Tori, PT)  OT Visit Diagnosis: Muscle weakness (generalized) (M62.81)                Time: [id number] OT Time Calculation (min): 19 min Charges:  OT General Charges $OT Visit: 1 Visit OT Evaluation $OT Eval Low Complexity: 1 Low  Gustavo Lah, OTR/L [place]  Office [phone]   Lenward Chancellor 04/30/2022, 10:37 AM

## 2022-04-30 NOTE — Progress Notes (Signed)
PROGRESS NOTE    [name]  [name] DOB: Jul 03, 1923 DOA: 04/28/2022 PCP: [name], MD    Brief Narrative:  89 y.o. male with medical history significant of hormone refractory prostate cancer-completed a course of palliative radiation to his lumbar spine on 2/13. He subsequently started developing nausea, diarrhea and generalized weakness.  He was sent to the hospital as a direct admit by his oncologist. After completing palliative radiation to his lumbar spine, he started to develop nausea, vomiting, diarrhea.  Initially, thought it was a stomach bug, but has not been able to keep any nutrition down for about 10 days.  Admitted with dehydration and poor oral intake.  Assessment & Plan:   Nausea vomiting, poor oral intake, chronic diarrhea: Probably multifactorial.  C. difficile negative.  GI pathogen panel pending.  Continue maintenance IV fluids.  Some clinical improvement today.  CT scan abdomen pelvis without any significant findings. Continue to encourage oral intake, nutritional supplements, oral fluids.  Can use Imodium. Monitor for diet tolerance today.  Metastatic prostate cancer: Exhausted treatment.  Recommended home hospice.  Chronic ITP: Stable on prednisone.  Paroxysmal A-fib: Sinus rhythm.  On Lopressor.  Not anticoagulation candidate.  Electrolytes: Replaced.  Chronic pain and depression: Stable on tramadol, Zoloft.   DVT prophylaxis: SCDs Start: 04/28/22 1714   Code Status: DNR Family Communication: Wife at the bedside Disposition Plan: Status is: Inpatient Remains inpatient appropriate because: Diet tolerance     Consultants:  Oncology  Procedures:  None  Antimicrobials:  None   Subjective: Patient seen and examined.  Wife at the bedside.  Wife tells me that he ate some breakfast in 2 weeks today.  No more diarrhea last night.  He is attempting to walk with the physical therapist in the hallway.  Family is very nervous about him  going home without adequate oral intake. Patient and family willing to go home with home health therapies and continue supportive care.  They are not willing to have home hospice started at this time.  Objective: Vitals:   04/29/22 2016 04/29/22 2156 04/30/22 0511 04/30/22 0736  BP: 122/67 122/64 126/66 (!) 112/51  Pulse: 65 62 64 64  Resp: '18  18 16  '$ Temp: 98 F (36.7 C)  98 F (36.7 C) 98.4 F (36.9 C)  TempSrc: Oral  Oral Oral  SpO2: 96%  97% 95%  Weight:      Height:        Intake/Output Summary (Last 24 hours) at 04/30/2022 1325 Last data filed at 04/29/2022 1800 Gross per 24 hour  Intake 555.41 ml  Output --  Net 555.41 ml   Filed Weights   04/28/22 1757  Weight: 66.2 kg    Examination:  General exam: Appears calm and comfortable  Appropriate for age.  Hard of hearing.  Looks comfortable. Respiratory system: No added sounds. Cardiovascular system: S1 & S2 heard, RRR. No pedal edema. Gastrointestinal system: Soft.  Nontender.   Central nervous system: Alert and oriented. No focal neurological deficits. Extremities: Symmetric 5 x 5 power.    Data Reviewed: I have personally reviewed following labs and imaging studies  CBC: Recent Labs  Lab 04/28/22 1031 04/29/22 0628 04/30/22 0627  WBC 9.2 10.7* 9.4  NEUTROABS 3.5  --  5.2  HGB 9.4* 7.7* 8.1*  HCT 30.1* 25.7* 27.2*  MCV 80.5 81.3 83.7  PLT 32* 39* 38*   Basic Metabolic Panel: Recent Labs  Lab 04/28/22 1031 04/29/22 0628 04/30/22 0627  NA 139 138  137  K 4.0 3.4* 3.8  CL 109 112* 110  CO2 19* 17* 17*  GLUCOSE 101* 80 110*  BUN 26* 23 26*  CREATININE 1.12 1.00 1.00  CALCIUM 7.9* 7.2* 7.6*  MG  --   --  1.7   GFR: Estimated Creatinine Clearance: 37.6 mL/min (by C-G formula based on SCr of 1 mg/dL). Liver Function Tests: Recent Labs  Lab 04/28/22 1031 04/29/22 0628  AST 28 19  ALT 10 9  ALKPHOS 1,195* 913*  BILITOT 0.5 1.0  PROT 5.3* 4.3*  ALBUMIN 3.1* 2.1*   No results for  input(s): "LIPASE", "AMYLASE" in the last 168 hours. No results for input(s): "AMMONIA" in the last 168 hours. Coagulation Profile: No results for input(s): "INR", "PROTIME" in the last 168 hours. Cardiac Enzymes: No results for input(s): "CKTOTAL", "CKMB", "CKMBINDEX", "TROPONINI" in the last 168 hours. BNP (last 3 results) No results for input(s): "PROBNP" in the last 8760 hours. HbA1C: No results for input(s): "HGBA1C" in the last 72 hours. CBG: No results for input(s): "GLUCAP" in the last 168 hours. Lipid Profile: No results for input(s): "CHOL", "HDL", "LDLCALC", "TRIG", "CHOLHDL", "LDLDIRECT" in the last 72 hours. Thyroid Function Tests: No results for input(s): "TSH", "T4TOTAL", "FREET4", "T3FREE", "THYROIDAB" in the last 72 hours. Anemia Panel: No results for input(s): "VITAMINB12", "FOLATE", "FERRITIN", "TIBC", "IRON", "RETICCTPCT" in the last 72 hours. Sepsis Labs: No results for input(s): "PROCALCITON", "LATICACIDVEN" in the last 168 hours.  Recent Results (from the past 240 hour(s))  C difficile quick screen w PCR reflex     Status: None   Collection Time: 04/28/22 11:49 AM   Specimen: STOOL  Result Value Ref Range Status   C Diff antigen NEGATIVE NEGATIVE Final   C Diff toxin NEGATIVE NEGATIVE Final   C Diff interpretation No C. difficile detected.  Final    Comment: Performed at [facility], [place] [address] [place], [place] [address]  GI pathogen panel by PCR, stool     Status: None   Collection Time: 04/28/22 11:49 AM   Specimen: Stool  Result Value Ref Range Status   Plesiomonas shigelloides NOT DETECTED NOT DETECTED Final   Yersinia enterocolitica NOT DETECTED NOT DETECTED Final   Vibrio NOT DETECTED NOT DETECTED Final   Enteropathogenic E coli NOT DETECTED NOT DETECTED Final   E coli (ETEC) LT/ST NOT DETECTED NOT DETECTED Final   E coli [id number] by PCR Not applicable NOT DETECTED Final   Cryptosporidium by PCR NOT DETECTED NOT DETECTED Final   Entamoeba  histolytica NOT DETECTED NOT DETECTED Final   Adenovirus F 40/41 NOT DETECTED NOT DETECTED Final   Norovirus GI/GII NOT DETECTED NOT DETECTED Final   Sapovirus NOT DETECTED NOT DETECTED Final    Comment: (NOTE) Performed At: [facility] [place], [place] [id number] Rush Farmer MD [id number]    Vibrio cholerae NOT DETECTED NOT DETECTED Final   Campylobacter by PCR NOT DETECTED NOT DETECTED Final   Salmonella by PCR NOT DETECTED NOT DETECTED Final   E coli (STEC) NOT DETECTED NOT DETECTED Final   Enteroaggregative E coli NOT DETECTED NOT DETECTED Final   Shigella by PCR NOT DETECTED NOT DETECTED Final   Cyclospora cayetanensis NOT DETECTED NOT DETECTED Final   Astrovirus NOT DETECTED NOT DETECTED Final   G lamblia by PCR NOT DETECTED NOT DETECTED Final   Rotavirus A by PCR NOT DETECTED NOT DETECTED Final         Radiology Studies: CT ABDOMEN PELVIS  W CONTRAST  Result Date: 04/29/2022 CLINICAL DATA:  Abdominal pain.  History of prostate cancer. EXAM: CT ABDOMEN AND PELVIS WITH CONTRAST TECHNIQUE: Multidetector CT imaging of the abdomen and pelvis was performed using the standard protocol following bolus administration of intravenous contrast. RADIATION DOSE REDUCTION: This exam was performed according to the departmental dose-optimization program which includes automated exposure control, adjustment of the mA and/or kV according to patient size and/or use of iterative reconstruction technique. CONTRAST:  67m OMNIPAQUE IOHEXOL 300 MG/ML  SOLN COMPARISON:  Virtual colonoscopy 06/25/2021 FINDINGS: Lower chest: Chronic interstitial opacities in the lung bases appear unchanged. Left perihilar/left upper lobe pulmonary nodule present measuring 3.8 x 3.2 cm. Hepatobiliary: No focal liver abnormality is seen. No gallstones, gallbladder wall thickening, or biliary dilatation. Pancreas: Scattered pancreatic calcifications are present compatible with chronic pancreatitis.  No acute inflammation. Spleen: Surgically absent. Adrenals/Urinary Tract: There is no hydronephrosis or perinephric fluid. There is a 2.4 cm cyst in the right kidney. The adrenal glands and bladder are within normal limits. Stomach/Bowel: Stomach is within normal limits. No evidence of bowel wall thickening, distention, or inflammatory changes. The appendix is not visualized. Vascular/Lymphatic: Aortic atherosclerosis. No enlarged abdominal or pelvic lymph nodes. Reproductive: Prostate gland is enlarged, unchanged. Other: No abdominal wall hernia or abnormality. No abdominopelvic ascites. There is mild body wall edema. Musculoskeletal: Diffuse osseous metastatic disease is progressed no acute fractures are seen. IMPRESSION: 1. No acute localizing process in the abdomen or pelvis. 2. Diffuse osseous metastatic disease is progressed. 3. Left perihilar/left upper lobe pulmonary nodule worrisome for metastatic disease. 4. Mild body wall edema. 5. Stable right renal cysts.  No follow-up imaging recommended. Aortic Atherosclerosis (ICD10-I70.0). Electronically Signed   By: ARonney AstersM.D.   On: 04/29/2022 21:58        Scheduled Meds:  atorvastatin  20 mg Oral q1800   feeding supplement  1 Container Oral TID BM   ferrous sulfate  325 mg Oral Q breakfast   fiber supplement (BANATROL TF)  60 mL Oral BID   finasteride  5 mg Oral Daily   latanoprost  1 drop Both Eyes QHS   metoprolol tartrate  12.5 mg Oral BID   pantoprazole  40 mg Oral Daily   [START ON 05/01/2022] predniSONE  5 mg Oral Q breakfast   sertraline  50 mg Oral Daily   tamsulosin  0.8 mg Oral QPC supper   timolol  1 drop Right Eye Daily   Continuous Infusions:  lactated ringers 125 mL/hr at 04/29/22 1328     LOS: 2 days    Time spent: 35 minutes    KBarb Merino MD [facility] Pager [phone]

## 2022-05-01 DIAGNOSIS — R627 Adult failure to thrive: Secondary | ICD-10-CM | POA: Diagnosis not present

## 2022-05-01 NOTE — Care Management Important Message (Signed)
Important Message  Patient Details IM Letter given. Name: [name] MRN: [id number] Date of Birth: [birth year]   Medicare Important Message Given:  Yes     Kerin Salen 05/01/2022, 8:56 AM

## 2022-05-01 NOTE — Evaluation (Signed)
Clinical/Bedside Swallow Evaluation Patient Details  Name: [name] MRN: [id number] Date of Birth: 08/31/1923  Today's Date: 05/01/2022 Time: SLP Start Time (ACUTE ONLY): 1142 SLP Stop Time (ACUTE ONLY): 1202 SLP Time Calculation (min) (ACUTE ONLY): 20 min  Past Medical History:  Past Medical History:  Diagnosis Date   Acute appendicitis    Anemia    Anxiety    Arthritis    "back" (03/14/2014)   Blood dyscrasia    hairy cell leukemia   CAD (coronary artery disease)    a. 03/14/14  s/p overlapping DES x2 to mid-distal RCA.   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    Colon cancer ([place]) 1984   Compression fracture of lumbar spine, non-traumatic (HCC)    DJD (degenerative joint disease) of lumbar spine    Dyslipidemia    Dyspnea    Elevated PSA    GERD (gastroesophageal reflux disease)    Glaucoma    Hairy cell leukemia ([place]) dx'd 1980   Heart murmur    History of blood transfusion    "several; related to hairy cell leukemia & tx "   History of stomach ulcers 1968   Hypertension    Leukemia, hairy cell ([place])    Malnutrition ([place])    Osteoarthritis    Osteoporosis    Pneumonia    S/P TAVR (transcatheter aortic valve replacement) 09/06/2019   s/p TAVR with a 26 mm Edwards S3U via the TF approach by Dr. Angelena Form and Cyndia Bent.    Severe aortic stenosis    s/p tavr   Skin cancer of face    Past Surgical History:  Past Surgical History:  Procedure Laterality Date   APPENDECTOMY  10/2007   BUBBLE STUDY  11/02/2019   Procedure: BUBBLE STUDY;  Surgeon: Elouise Munroe, MD;  Location: [facility] ENDOSCOPY;  Service: Cardiology;;   CATARACT EXTRACTION, BILATERAL Bilateral 02/2008   COLON SURGERY  1984   "sigmoid; open"   CORONARY ANGIOPLASTY WITH STENT PLACEMENT  03/14/2014   "2"   EYE SURGERY Bilateral    cataract   FRACTURE SURGERY     LEFT HEART CATHETERIZATION WITH CORONARY ANGIOGRAM N/A 03/14/2014   Procedure: LEFT HEART CATHETERIZATION WITH CORONARY  ANGIOGRAM;  Surgeon: Peter M Martinique, MD;  Location: [facility] CATH LAB;  Service: Cardiovascular;  Laterality: N/A;   LIPOMA EXCISION Right 07/2011   liposarcoma resection; "back"   MOHS SURGERY Left 02/2011   MOHS SURGERY  X 3   "all on my face"   MOLE REMOVAL Left 1985   cheek   ORIF ANKLE FRACTURE Left 2000   RIGHT/LEFT HEART CATH AND CORONARY ANGIOGRAPHY N/A 07/26/2019   Procedure: RIGHT/LEFT HEART CATH AND CORONARY ANGIOGRAPHY;  Surgeon: Belva Crome, MD;  Location: [place] CV LAB;  Service: Cardiovascular;  Laterality: N/A;   SHOULDER SURGERY  04/2004   SPLENECTOMY  1992   TEE WITHOUT CARDIOVERSION N/A 09/06/2019   Procedure: TRANSESOPHAGEAL ECHOCARDIOGRAM (TEE);  Surgeon: Burnell Blanks, MD;  Location: [place] CV LAB;  Service: Open Heart Surgery;  Laterality: N/A;   TEE WITHOUT CARDIOVERSION N/A 11/02/2019   Procedure: TRANSESOPHAGEAL ECHOCARDIOGRAM (TEE);  Surgeon: Elouise Munroe, MD;  Location: [place];  Service: Cardiology;  Laterality: N/A;   TONSILLECTOMY AND ADENOIDECTOMY  1939   TRANSCATHETER AORTIC VALVE REPLACEMENT, TRANSFEMORAL N/A 09/06/2019   Procedure: TRANSCATHETER AORTIC VALVE REPLACEMENT, TRANSFEMORAL;  Surgeon: Burnell Blanks, MD;  Location: [place] CV LAB;  Service: Open Heart Surgery;  Laterality: N/A;   HPI:  [name] is a 89 y.o. male with medical history significant of hormone refractory prostate cancer-completed a course of palliative radiation to his lumbar spine on 2/13-he subsequently started developing nausea, diarrhea and generalized weakness.Admitted for failure to thrive on 04/28/22. CXR on 04/01/22 indicated  Left perihilar mass is noted concerning for malignancy. Sclerotic  densities are noted throughout the skeleton concerning for osseous  metastases. CT scan of the chest is recommended for further  evaluation.  ST consulted for BSE to assess swallow function d/t concerns reported by pt/wife.    Assessment / Plan /  Recommendation  Clinical Impression  Pt with normal oropharyngeal swallow c/b adequate oral preparation/propulsion, timely swallow (taking presbyphagia into consideration) and no overt s/sx of aspiration present throughout PO intake trial with lunch tray, despite taxing pt with mixed consistency of chicken noodle soup.  Pt consumed all conistencies without difficulty observed and vocal quality (although low) remained consistent throughout assessment.  Pt denotes xerostomia intermittently, but independently moistens oral mucosa prior to intake per nursing report (when taking medications).  Pt did mention regurgitation/gagging in past during meals, but uses strategies to combat this occurrence such as limiting volume, repeating swallows and/or liquid wash.  Discussed esophageal precautions/general swallowing precautions d/t deconditioned state post-radiation and pt/family in agreement with instituting these during all PO intake.  Recommend continue Regular/thin liquid diet with precautions for esophageal/general swallowing in place.  ST will s/o in this setting.  Thank you for this consult. SLP Visit Diagnosis: Dysphagia, unspecified (R13.10)    Aspiration Risk  Mild aspiration risk    Diet Recommendation Regular/thin liquids    Medication Administration: Whole meds with liquid    Other  Recommendations Oral Care Recommendations: Oral care BID;Patient independent with oral care    Recommendations for follow up therapy are one component of a multi-disciplinary discharge planning process, led by the attending physician.  Recommendations may be updated based on patient status, additional functional criteria and insurance authorization.  Follow up Recommendations No SLP follow up      Assistance Recommended at Discharge  Intermittent/full  Functional Status Assessment Patient has had a recent decline in their functional status and demonstrates the ability to make significant improvements in function  in a reasonable and predictable amount of time.  Frequency and Duration  (evaluation only)          Prognosis Prognosis for improved oropharyngeal function: Good      Swallow Study   General HPI: [name] is a 89 y.o. male with medical history significant of hormone refractory prostate cancer-completed a course of palliative radiation to his lumbar spine on 2/13-he subsequently started developing nausea, diarrhea and generalized weakness.Admitted for failure to thrive on 04/28/22. CXR on 04/01/22 indicated  Left perihilar mass is noted concerning for malignancy. Sclerotic  densities are noted throughout the skeleton concerning for osseous  metastases. CT scan of the chest is recommended for further  evaluation.  ST consulted for BSE to assess swallow function d/t concerns reported by pt/wife. Type of Study: Bedside Swallow Evaluation Previous Swallow Assessment: n/a Diet Prior to this Study: Regular;Thin liquids (Level 0) Temperature Spikes Noted: No Respiratory Status: Room air History of Recent Intubation: No Behavior/Cognition: Alert;Cooperative;Other (Comment) (HOH) Oral Cavity Assessment: Dry Oral Care Completed by SLP: Other (Comment) (recent completion by pt/caregiver) Oral Cavity - Dentition: Adequate natural dentition Vision: Functional for self-feeding Self-Feeding Abilities: Able to feed self;Needs assist Patient Positioning: Upright in bed Baseline Vocal Quality: Low vocal intensity Volitional Cough: Strong Volitional  Swallow: Able to elicit    Oral/Motor/Sensory Function [name] Oral Motor/Sensory Function: Within functional limits (generalized oral weakness, adequate for swallowing/oral preparation)   Ice Chips Ice chips: Not tested   Thin Liquid Thin Liquid: Within functional limits Presentation: Straw;Self Fed Other Comments: small sips encouraged    Nectar Thick Nectar Thick Liquid: Not tested   Honey Thick Honey Thick Liquid: Not tested   Puree Puree: Within  functional limits Presentation: Spoon   Solid     Solid: Within functional limits Presentation: Self Fed Other Comments: encouraged small bites      Pat Myrle Wanek,M.S., CCC-SLP 05/01/2022,12:39 PM

## 2022-05-01 NOTE — Progress Notes (Signed)
PROGRESS NOTE    [name]  [id number] DOB: [birth year] DOA: 04/28/2022 PCP: Douglas Lopes, MD    Brief Narrative:  89 y.o. male with medical history significant of hormone refractory prostate cancer-completed a course of palliative radiation to his lumbar spine on 2/13. He subsequently started developing nausea, diarrhea and generalized weakness.  He was sent to the hospital as a direct admit by his oncologist. After completing palliative radiation to his lumbar spine, he started to develop nausea, vomiting, diarrhea.  Initially, thought it was a stomach bug, but has not been able to keep any nutrition down for about 10 days.  Admitted with dehydration and poor oral intake.  Assessment & Plan:   Nausea vomiting, poor oral intake, chronic diarrhea: Probably multifactorial.  C. difficile negative.  GI pathogen panel negative. Continue maintenance IV fluids.  Some clinical improvement today.  CT scan abdomen pelvis without any significant findings. Continue to encourage oral intake, nutritional supplements, oral fluids.  Can use Imodium. Monitor for diet tolerance today. Can use Zofran before meals.  Metastatic prostate cancer: Exhausted treatment.  Recommended home hospice and surveillance. Back pain improved with radiation therapy however caused poor appetite and nausea with diarrhea. He does occasionally have pain, controlled with tramadol now.  Chronic ITP: Stable on prednisone.  Paroxysmal A-fib: Sinus rhythm.  On Lopressor.  Not anticoagulation candidate.  Electrolytes: Replaced.  Chronic pain and depression: Stable on tramadol, Zoloft.  Goal of care: Patient with poor appetite which is hardly improving now. 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 Discussion with patient's daughter, patient likely will have more difficulties  at home mobilizing in current condition.  Daughter is more realistic about patient may need hospice care. Will consult palliative care team to coordinate, educate, bring family together for goal of care discussions. Advised family that if patient is not eating adequate, he may have to go home with hospice or even go to inpatient hospice.  If he eats adequate and moves around he may go home with home health.   DVT prophylaxis: SCDs Start: 04/28/22 1714   Code Status: DNR Family Communication: Daughter at the bedside. Disposition Plan: Status is: Inpatient Remains inpatient appropriate because: Diet tolerance     Consultants:  Oncology Palliative care  Procedures:  None  Antimicrobials:  None   Subjective: Patient seen and examined.  He just woke up.  One of his daughter was at the bedside.  Patient tells me he did not sleep well last night because of ongoing disturbance.  Does not have any appetite.  No reported diarrhea since last 24 hours, however patient feels bloated and full. According to the family, he had hardly taken any meals.  He just taking few bites.  We decided to monitor his intake today and also talk with palliative care.  Objective: Vitals:   04/30/22 2021 04/30/22 2123 05/01/22 0608 05/01/22 0842  BP: 134/62 133/64 128/64 126/67  Pulse: 63 66 77 74  Resp: '18  18 17  '$ Temp: 97.9 F (36.6 C)  98.1 F (36.7 C) 98 F (36.7 C)  TempSrc: Oral   Oral  SpO2: 97%  96% 96%  Weight:      Height:        Intake/Output Summary (Last 24 hours) at 05/01/2022 1042 Last data filed at 04/30/2022 1500 Gross per 24 hour  Intake 117 ml  Output 200 ml  Net -83 ml    Filed Weights   04/28/22 1757  Weight: 66.2 kg    Examination:  General exam: Appears calm and comfortable , frail and debilitated.  Slightly anxious today. Respiratory system: No added sounds. Cardiovascular system: S1 & S2 heard, RRR. No pedal edema. Gastrointestinal system: Soft.  Nontender.    Central nervous system: Alert and oriented. No focal neurological deficits. Extremities: Symmetric 5 x 5 power.    Data Reviewed: I have personally reviewed following labs and imaging studies  CBC: Recent Labs  Lab 04/28/22 1031 04/29/22 0628 04/30/22 0627  WBC 9.2 10.7* 9.4  NEUTROABS 3.5  --  5.2  HGB 9.4* 7.7* 8.1*  HCT 30.1* 25.7* 27.2*  MCV 80.5 81.3 83.7  PLT 32* 39* 38*    Basic Metabolic Panel: Recent Labs  Lab 04/28/22 1031 04/29/22 0628 04/30/22 0627  NA 139 138 137  K 4.0 3.4* 3.8  CL 109 112* 110  CO2 19* 17* 17*  GLUCOSE 101* 80 110*  BUN 26* 23 26*  CREATININE 1.12 1.00 1.00  CALCIUM 7.9* 7.2* 7.6*  MG  --   --  1.7    GFR: Estimated Creatinine Clearance: 37.6 mL/min (by C-G formula based on SCr of 1 mg/dL). Liver Function Tests: Recent Labs  Lab 04/28/22 1031 04/29/22 0628  AST 28 19  ALT 10 9  ALKPHOS 1,195* 913*  BILITOT 0.5 1.0  PROT 5.3* 4.3*  ALBUMIN 3.1* 2.1*    No results for input(s): "LIPASE", "AMYLASE" in the last 168 hours. No results for input(s): "AMMONIA" in the last 168 hours. Coagulation Profile: No results for input(s): "INR", "PROTIME" in the last 168 hours. Cardiac Enzymes: No results for input(s): "CKTOTAL", "CKMB", "CKMBINDEX", "TROPONINI" in the last 168 hours. BNP (last 3 results) No results for input(s): "PROBNP" in the last 8760 hours. HbA1C: No results for input(s): "HGBA1C" in the last 72 hours. CBG: No results for input(s): "GLUCAP" in the last 168 hours. Lipid Profile: No results for input(s): "CHOL", "HDL", "LDLCALC", "TRIG", "CHOLHDL", "LDLDIRECT" in the last 72 hours. Thyroid Function Tests: No results for input(s): "TSH", "T4TOTAL", "FREET4", "T3FREE", "THYROIDAB" in the last 72 hours. Anemia Panel: No results for input(s): "VITAMINB12", "FOLATE", "FERRITIN", "TIBC", "IRON", "RETICCTPCT" in the last 72 hours. Sepsis Labs: No results for input(s): "PROCALCITON", "LATICACIDVEN" in the last 168  hours.  Recent Results (from the past 240 hour(s))  C difficile quick screen w PCR reflex     Status: None   Collection Time: 04/28/22 11:49 AM   Specimen: STOOL  Result Value Ref Range Status   C Diff antigen NEGATIVE NEGATIVE Final   C Diff toxin NEGATIVE NEGATIVE Final   C Diff interpretation No C. difficile detected.  Final    Comment: Performed at [facility], [place] [address] [place], [place] [address]  GI pathogen panel by PCR, stool     Status: None   Collection Time: 04/28/22 11:49 AM   Specimen: Stool  Result Value Ref Range Status   Plesiomonas shigelloides NOT DETECTED NOT DETECTED Final   Yersinia enterocolitica NOT DETECTED NOT DETECTED Final   Vibrio NOT DETECTED NOT DETECTED Final   Enteropathogenic E coli NOT DETECTED NOT DETECTED Final   E coli (ETEC) LT/ST NOT DETECTED NOT DETECTED Final   E coli [id number] by PCR Not applicable NOT DETECTED Final   Cryptosporidium by PCR NOT DETECTED NOT DETECTED Final   Entamoeba histolytica NOT DETECTED NOT DETECTED Final  Adenovirus F 40/41 NOT DETECTED NOT DETECTED Final   Norovirus GI/GII NOT DETECTED NOT DETECTED Final   Sapovirus NOT DETECTED NOT DETECTED Final    Comment: (NOTE) Performed At: [facility] [place], [place] [id number] Rush Farmer MD [id number]    Vibrio cholerae NOT DETECTED NOT DETECTED Final   Campylobacter by PCR NOT DETECTED NOT DETECTED Final   Salmonella by PCR NOT DETECTED NOT DETECTED Final   E coli (STEC) NOT DETECTED NOT DETECTED Final   Enteroaggregative E coli NOT DETECTED NOT DETECTED Final   Shigella by PCR NOT DETECTED NOT DETECTED Final   Cyclospora cayetanensis NOT DETECTED NOT DETECTED Final   Astrovirus NOT DETECTED NOT DETECTED Final   G lamblia by PCR NOT DETECTED NOT DETECTED Final   Rotavirus A by PCR NOT DETECTED NOT DETECTED Final         Radiology Studies: CT ABDOMEN PELVIS W CONTRAST  Result Date: 04/29/2022 CLINICAL DATA:  Abdominal  pain.  History of prostate cancer. EXAM: CT ABDOMEN AND PELVIS WITH CONTRAST TECHNIQUE: Multidetector CT imaging of the abdomen and pelvis was performed using the standard protocol following bolus administration of intravenous contrast. RADIATION DOSE REDUCTION: This exam was performed according to the departmental dose-optimization program which includes automated exposure control, adjustment of the mA and/or kV according to patient size and/or use of iterative reconstruction technique. CONTRAST:  72m OMNIPAQUE IOHEXOL 300 MG/ML  SOLN COMPARISON:  Virtual colonoscopy 06/25/2021 FINDINGS: Lower chest: Chronic interstitial opacities in the lung bases appear unchanged. Left perihilar/left upper lobe pulmonary nodule present measuring 3.8 x 3.2 cm. Hepatobiliary: No focal liver abnormality is seen. No gallstones, gallbladder wall thickening, or biliary dilatation. Pancreas: Scattered pancreatic calcifications are present compatible with chronic pancreatitis. No acute inflammation. Spleen: Surgically absent. Adrenals/Urinary Tract: There is no hydronephrosis or perinephric fluid. There is a 2.4 cm cyst in the right kidney. The adrenal glands and bladder are within normal limits. Stomach/Bowel: Stomach is within normal limits. No evidence of bowel wall thickening, distention, or inflammatory changes. The appendix is not visualized. Vascular/Lymphatic: Aortic atherosclerosis. No enlarged abdominal or pelvic lymph nodes. Reproductive: Prostate gland is enlarged, unchanged. Other: No abdominal wall hernia or abnormality. No abdominopelvic ascites. There is mild body wall edema. Musculoskeletal: Diffuse osseous metastatic disease is progressed no acute fractures are seen. IMPRESSION: 1. No acute localizing process in the abdomen or pelvis. 2. Diffuse osseous metastatic disease is progressed. 3. Left perihilar/left upper lobe pulmonary nodule worrisome for metastatic disease. 4. Mild body wall edema. 5. Stable right renal  cysts.  No follow-up imaging recommended. Aortic Atherosclerosis (ICD10-I70.0). Electronically Signed   By: ARonney AstersM.D.   On: 04/29/2022 21:58        Scheduled Meds:  atorvastatin  20 mg Oral q1800   feeding supplement  1 Container Oral TID BM   ferrous sulfate  325 mg Oral Q breakfast   fiber supplement (BANATROL TF)  60 mL Oral BID   finasteride  5 mg Oral Daily   latanoprost  1 drop Both Eyes QHS   metoprolol tartrate  12.5 mg Oral BID   pantoprazole  40 mg Oral Daily   predniSONE  5 mg Oral Q breakfast   sertraline  50 mg Oral Daily   tamsulosin  0.8 mg Oral QPC supper   timolol  1 drop Right Eye Daily   Continuous Infusions:  lactated ringers 125 mL/hr at 05/01/22 0852     LOS: 3 days    Time spent:  76 minutes    Barb Merino, MD [facility] Pager [phone]

## 2022-05-01 NOTE — Progress Notes (Signed)
                                                                                                                                                             Patient Name: JKAI ELFMAN MRN: [id number] DOB: [birth year] Referring Physician: [name] (Profile Not Attached) Date of Service: 04/16/2022 [facility], [place]                                                        End Of Treatment Note  Diagnoses: C61-Malignant neoplasm of prostate C79.51-Secondary malignant neoplasm of bone  Cancer Staging: 89 y.o. 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  Intent: Palliative  Radiation Treatment Dates: 04/03/2022 through 04/16/2022 Site Technique Total Dose (Gy) Dose per Fx (Gy) Completed Fx Beam Energies  Lumbar Spine: Spine 3D 30/30 3 10/10 10X, 15X   Narrative: The patient tolerated radiation therapy relatively well without any ill side effects.  Plan: The patient will receive a call in about one month from the radiation oncology department. He will continue follow up with his medical oncologist, Dr. Benay Spice as well.  ------------------------------------------------   Tyler Pita, MD [place]: [phone]  Fax: [phone] [email or web address]  Skype  [email or web address]

## 2022-05-02 DIAGNOSIS — R627 Adult failure to thrive: Secondary | ICD-10-CM | POA: Diagnosis not present

## 2022-05-02 MED ORDER — ONDANSETRON 4 MG PO TBDP
4.0000 mg | ORAL_TABLET | Freq: Three times a day (TID) | ORAL | Status: DC
  Administered 2022-05-02 – 2022-05-05 (×7): 4 mg via ORAL
  Filled 2022-05-02 (×10): qty 1

## 2022-05-02 NOTE — Plan of Care (Signed)
Patient AOX4, VSS throughout shift.  All meds given on time as ordered.  Fluids on-going.  Pt standby assist to bedside commode and had 3 diarrhea episodes this shift.  Pt voided in bedside commode.  Diminished lungs, IS encouraged.  POC maintained, will continue to monitor.  Problem: Education: Goal: Knowledge of General Education information will improve Description: Including pain rating scale, medication(s)/side effects and non-pharmacologic comfort measures Outcome: Progressing   Problem: Health Behavior/Discharge Planning: Goal: Ability to manage health-related needs will improve Outcome: Progressing   Problem: Clinical Measurements: Goal: Ability to maintain clinical measurements within normal limits will improve Outcome: Progressing Goal: Will remain free from infection Outcome: Progressing Goal: Diagnostic test results will improve Outcome: Progressing Goal: Respiratory complications will improve Outcome: Progressing Goal: Cardiovascular complication will be avoided Outcome: Progressing   Problem: Activity: Goal: Risk for activity intolerance will decrease Outcome: Progressing   Problem: Nutrition: Goal: Adequate nutrition will be maintained Outcome: Progressing   Problem: Coping: Goal: Level of anxiety will decrease Outcome: Progressing   Problem: Elimination: Goal: Will not experience complications related to bowel motility Outcome: Progressing Goal: Will not experience complications related to urinary retention Outcome: Progressing   Problem: Pain Managment: Goal: General experience of comfort will improve Outcome: Progressing   Problem: Safety: Goal: Ability to remain free from injury will improve Outcome: Progressing   Problem: Skin Integrity: Goal: Risk for impaired skin integrity will decrease Outcome: Progressing

## 2022-05-02 NOTE — Progress Notes (Signed)
PT Cancellation Note  Patient Details Name: [name] MRN: [id number] DOB: 07-08-1923   Cancelled Treatment:    Reason Eval/Treat Not Completed: Other (comment) (family meeting). Spouse reports pt prepping for family meeting. Will continue to follow.   Tori Renika Shiflet PT, DPT 05/02/22, 10:50 AM

## 2022-05-02 NOTE — Progress Notes (Signed)
PROGRESS NOTE    [name]  [name] DOB: [birth year] DOA: 04/28/2022 PCP: Douglas Lopes, MD    Brief Narrative:  89 y.o. male with medical history significant of hormone refractory prostate cancer-completed a course of palliative radiation to his lumbar spine on 2/13. He subsequently started developing nausea, diarrhea and generalized weakness.  He was sent to the hospital as a direct admit by his oncologist. After completing palliative radiation to his lumbar spine, he started to develop nausea, vomiting, diarrhea.  Initially, thought it was a stomach bug, but has not been able to keep any nutrition down for about 10 days.  Admitted with dehydration and poor oral intake.  Assessment & Plan:   Nausea vomiting, poor oral intake, chronic diarrhea: Probably multifactorial.  C. difficile negative.  GI pathogen panel negative. Continue maintenance IV fluids.  Appetite is somewhat improved, however remains debilitated and continues to have diarrhea.  CT scan abdomen pelvis without any significant findings. Continue to encourage oral intake, nutritional supplements, oral fluids.  Can use Imodium. Monitor for diet tolerance today. Can use Zofran before meals.  Will try scheduled oral Zofran before meals today.  Metastatic prostate cancer: Exhausted treatment.  Recommended home hospice and surveillance. Back pain improved with radiation therapy however caused poor appetite and nausea with diarrhea. He does occasionally have pain, controlled with tramadol now.  Chronic ITP: Stable on prednisone.  Paroxysmal A-fib: Sinus rhythm.  On Lopressor.  Not anticoagulation candidate.  Electrolytes: Replaced.  Chronic pain and depression: Stable on tramadol, Zoloft.  Goal of care: consult palliative care team to coordinate, educate, bring family together for goal of care discussions. Advised family that if patient is not eating adequate, he may have to go home with hospice or even go to  inpatient hospice.  If he eats adequate and moves around he may go home with home health. Family also needs to hire personal caretakers at home.   DVT prophylaxis: SCDs Start: 04/28/22 1714   Code Status: DNR Family Communication: Daughter at the bedside.  Wife at the bedside. Disposition Plan: Status is: Inpatient Remains inpatient appropriate because: Diet tolerance.  Improvement of diarrhea.     Consultants:  Oncology Palliative care  Procedures:  None  Antimicrobials:  None   Subjective:  Patient seen and examined.  He slept well last night except he had 3 loose bowel movements and continued to have interruptions.  According to the family, he did attempt to eat some meals yesterday but feels full.  3 loose bowel movements last 24 hours.  He is just unable to explain whether he is getting better or not getting better.  Objective: Vitals:   05/01/22 0608 05/01/22 0842 05/01/22 1358 05/01/22 2015  BP: 128/64 126/67 (!) 113/56 (!) 144/71  Pulse: 77 74 (!) 50 69  Resp: '18 17 16 20  '$ Temp: 98.1 F (36.7 C) 98 F (36.7 C) 98.4 F (36.9 C) 98.2 F (36.8 C)  TempSrc:  Oral Oral Oral  SpO2: 96% 96% 95% 96%  Weight:      Height:        Intake/Output Summary (Last 24 hours) at 05/02/2022 1139 Last data filed at 05/02/2022 1100 Gross per 24 hour  Intake 6165.59 ml  Output --  Net 6165.59 ml   Filed Weights   04/28/22 1757  Weight: 66.2 kg    Examination:  General exam: Chronically sick looking.  Frail and debilitated. Respiratory system: No added sounds. Cardiovascular system: S1 & S2 heard, RRR. No pedal  edema. Gastrointestinal system: Soft.  Nontender.   Central nervous system: Alert and oriented. No focal neurological deficits. Extremities: Symmetric 5 x 5 power.    Data Reviewed: I have personally reviewed following labs and imaging studies  CBC: Recent Labs  Lab 04/28/22 1031 04/29/22 0628 04/30/22 0627  WBC 9.2 10.7* 9.4  NEUTROABS 3.5  --  5.2   HGB 9.4* 7.7* 8.1*  HCT 30.1* 25.7* 27.2*  MCV 80.5 81.3 83.7  PLT 32* 39* 38*   Basic Metabolic Panel: Recent Labs  Lab 04/28/22 1031 04/29/22 0628 04/30/22 0627  NA 139 138 137  K 4.0 3.4* 3.8  CL 109 112* 110  CO2 19* 17* 17*  GLUCOSE 101* 80 110*  BUN 26* 23 26*  CREATININE 1.12 1.00 1.00  CALCIUM 7.9* 7.2* 7.6*  MG  --   --  1.7   GFR: Estimated Creatinine Clearance: 37.6 mL/min (by C-G formula based on SCr of 1 mg/dL). Liver Function Tests: Recent Labs  Lab 04/28/22 1031 04/29/22 0628  AST 28 19  ALT 10 9  ALKPHOS 1,195* 913*  BILITOT 0.5 1.0  PROT 5.3* 4.3*  ALBUMIN 3.1* 2.1*   No results for input(s): "LIPASE", "AMYLASE" in the last 168 hours. No results for input(s): "AMMONIA" in the last 168 hours. Coagulation Profile: No results for input(s): "INR", "PROTIME" in the last 168 hours. Cardiac Enzymes: No results for input(s): "CKTOTAL", "CKMB", "CKMBINDEX", "TROPONINI" in the last 168 hours. BNP (last 3 results) No results for input(s): "PROBNP" in the last 8760 hours. HbA1C: No results for input(s): "HGBA1C" in the last 72 hours. CBG: No results for input(s): "GLUCAP" in the last 168 hours. Lipid Profile: No results for input(s): "CHOL", "HDL", "LDLCALC", "TRIG", "CHOLHDL", "LDLDIRECT" in the last 72 hours. Thyroid Function Tests: No results for input(s): "TSH", "T4TOTAL", "FREET4", "T3FREE", "THYROIDAB" in the last 72 hours. Anemia Panel: No results for input(s): "VITAMINB12", "FOLATE", "FERRITIN", "TIBC", "IRON", "RETICCTPCT" in the last 72 hours. Sepsis Labs: No results for input(s): "PROCALCITON", "LATICACIDVEN" in the last 168 hours.  Recent Results (from the past 240 hour(s))  C difficile quick screen w PCR reflex     Status: None   Collection Time: 04/28/22 11:49 AM   Specimen: STOOL  Result Value Ref Range Status   C Diff antigen NEGATIVE NEGATIVE Final   C Diff toxin NEGATIVE NEGATIVE Final   C Diff interpretation No C. difficile  detected.  Final    Comment: Performed at [facility], [place] [address], [place],  [address]  GI pathogen panel by PCR, stool     Status: None   Collection Time: 04/28/22 11:49 AM   Specimen: Stool  Result Value Ref Range Status   Plesiomonas shigelloides NOT DETECTED NOT DETECTED Final   Yersinia enterocolitica NOT DETECTED NOT DETECTED Final   Vibrio NOT DETECTED NOT DETECTED Final   Enteropathogenic E coli NOT DETECTED NOT DETECTED Final   E coli (ETEC) LT/ST NOT DETECTED NOT DETECTED Final   E coli [id number] by PCR Not applicable NOT DETECTED Final   Cryptosporidium by PCR NOT DETECTED NOT DETECTED Final   Entamoeba histolytica NOT DETECTED NOT DETECTED Final   Adenovirus F 40/41 NOT DETECTED NOT DETECTED Final   Norovirus GI/GII NOT DETECTED NOT DETECTED Final   Sapovirus NOT DETECTED NOT DETECTED Final    Comment: (NOTE) Performed At: [facility] [place], [place] [id number] Rush Farmer MD [id number]    Vibrio cholerae NOT DETECTED NOT DETECTED Final  Campylobacter by PCR NOT DETECTED NOT DETECTED Final   Salmonella by PCR NOT DETECTED NOT DETECTED Final   E coli (STEC) NOT DETECTED NOT DETECTED Final   Enteroaggregative E coli NOT DETECTED NOT DETECTED Final   Shigella by PCR NOT DETECTED NOT DETECTED Final   Cyclospora cayetanensis NOT DETECTED NOT DETECTED Final   Astrovirus NOT DETECTED NOT DETECTED Final   G lamblia by PCR NOT DETECTED NOT DETECTED Final   Rotavirus A by PCR NOT DETECTED NOT DETECTED Final         Radiology Studies: No results found.      Scheduled Meds:  atorvastatin  20 mg Oral q1800   feeding supplement  1 Container Oral TID BM   ferrous sulfate  325 mg Oral Q breakfast   fiber supplement (BANATROL TF)  60 mL Oral BID   finasteride  5 mg Oral Daily   latanoprost  1 drop Both Eyes QHS   metoprolol tartrate  12.5 mg Oral BID   ondansetron  4 mg Oral TID AC   pantoprazole  40 mg Oral Daily    predniSONE  5 mg Oral Q breakfast   sertraline  50 mg Oral Daily   tamsulosin  0.8 mg Oral QPC supper   timolol  1 drop Right Eye Daily   Continuous Infusions:  lactated ringers 125 mL/hr at 05/02/22 1015     LOS: 4 days    Time spent: 35 minutes    Barb Merino, MD [facility] Pager [phone]

## 2022-05-03 DIAGNOSIS — R627 Adult failure to thrive: Secondary | ICD-10-CM | POA: Diagnosis not present

## 2022-05-03 MED ORDER — TRAZODONE HCL 50 MG PO TABS
25.0000 mg | ORAL_TABLET | Freq: Every day | ORAL | Status: DC
  Administered 2022-05-03: 25 mg via ORAL
  Filled 2022-05-03: qty 1

## 2022-05-03 NOTE — Progress Notes (Signed)
PROGRESS NOTE    [name]  [id number] DOB: [birth year] DOA: 04/28/2022 PCP: [name], MD    Brief Narrative:  89 y.o. male with medical history significant of hormone refractory prostate cancer-completed a course of palliative radiation to his lumbar spine on 2/13. He subsequently started developing nausea, diarrhea and generalized weakness.  He was sent to the hospital as a direct admit by his oncologist. After completing palliative radiation to his lumbar spine, he started to develop nausea, vomiting, diarrhea.  Initially, thought it was a stomach bug, but has not been able to keep any nutrition down for about 10 days.  Admitted with dehydration and poor oral intake. Remains in very poor clinical status with no appropriate improvement.  Assessment & Plan:   Nausea vomiting, poor oral intake, chronic diarrhea: Probably multifactorial.  C. difficile negative.  GI pathogen panel negative. Continue maintenance IV fluids.  Reduce rate of IV fluid today.  He is developing peripheral edema. Appetite is somewhat improved, however remains debilitated and continues to have diarrhea.  CT scan abdomen pelvis without any significant findings. Continue to encourage oral intake, nutritional supplements, oral fluids.  Can use Imodium. Monitor for diet tolerance. Can use Zofran before meals.  Trying scheduled oral Zofran before meals.  Metastatic prostate cancer: Exhausted treatment.  Recommended home hospice and surveillance. Back pain improved with radiation therapy however caused poor appetite and nausea with diarrhea. He does occasionally have pain, controlled with tramadol now.  Chronic ITP: Stable on prednisone.  Paroxysmal A-fib: Sinus rhythm.  On Lopressor.  Not anticoagulation candidate.  Electrolytes: Replaced.  Chronic pain and depression: Stable on tramadol, Zoloft.  Goal of care: consult palliative care team to coordinate, educate, bring family together for goal of  care discussions. Advised family that if patient is not eating adequate, he may have to go home with hospice or even go to inpatient hospice.  If he eats adequate and moves around he may go home with home health. Family also needs to hire personal caretakers at home. Patient has 2 daughters and they are in more acceptance that patient may be heading to hospice. Patient's wife is anticipating full recovery, however understands serious nature of the disease.   DVT prophylaxis: SCDs Start: 04/28/22 1714   Code Status: DNR Family Communication: 2 daughters at the bedside. Disposition Plan: Status is: Inpatient Remains inpatient appropriate because: Diet tolerance.  Improvement of diarrhea.     Consultants:  Oncology Palliative care  Procedures:  None  Antimicrobials:  None   Subjective:  Patient seen and examined.  2 of his daughters are at the bedside.  They are trying to feed him oatmeal and he is able to eat half of the cup.  Denies any nausea vomiting but appetite is very poor.  He does get abdominal distention sensation.  Had 3 bowel movement since last 24 hours.  Used Imodium last night. He is lacking a real sleep.  He wants a very restful sleep and wants to use some sleeping aid at night.  Will add some trazodone.  Objective: Vitals:   05/01/22 2015 05/02/22 1403 05/02/22 2031 05/03/22 0622  BP: (!) 144/71 139/66 129/72 (!) 129/56  Pulse: 69 (!) 55 62 68  Resp: '20 16 18 18  '$ Temp: 98.2 F (36.8 C)  97.8 F (36.6 C) (!) 97.3 F (36.3 C)  TempSrc: Oral  Oral Oral  SpO2: 96% 97% 99% 95%  Weight:      Height:  Intake/Output Summary (Last 24 hours) at 05/03/2022 1055 Last data filed at 05/02/2022 2200 Gross per 24 hour  Intake 2741.69 ml  Output --  Net 2741.69 ml   Filed Weights   04/28/22 1757  Weight: 66.2 kg    Examination:  General exam: Frail and debilitated.  He is alert oriented x 4.  Tired to keep up conversation. Respiratory system: No added  sounds. Cardiovascular system: S1 & S2 heard, RRR.  Trace bilateral pedal edema. Gastrointestinal system: Soft.  Nontender.   Central nervous system: Alert and oriented. No focal neurological deficits. Extremities: Symmetric 5 x 5 power.    Data Reviewed: I have personally reviewed following labs and imaging studies  CBC: Recent Labs  Lab 04/28/22 1031 04/29/22 0628 04/30/22 0627  WBC 9.2 10.7* 9.4  NEUTROABS 3.5  --  5.2  HGB 9.4* 7.7* 8.1*  HCT 30.1* 25.7* 27.2*  MCV 80.5 81.3 83.7  PLT 32* 39* 38*   Basic Metabolic Panel: Recent Labs  Lab 04/28/22 1031 04/29/22 0628 04/30/22 0627  NA 139 138 137  K 4.0 3.4* 3.8  CL 109 112* 110  CO2 19* 17* 17*  GLUCOSE 101* 80 110*  BUN 26* 23 26*  CREATININE 1.12 1.00 1.00  CALCIUM 7.9* 7.2* 7.6*  MG  --   --  1.7   GFR: Estimated Creatinine Clearance: 37.6 mL/min (by C-G formula based on SCr of 1 mg/dL). Liver Function Tests: Recent Labs  Lab 04/28/22 1031 04/29/22 0628  AST 28 19  ALT 10 9  ALKPHOS 1,195* 913*  BILITOT 0.5 1.0  PROT 5.3* 4.3*  ALBUMIN 3.1* 2.1*   No results for input(s): "LIPASE", "AMYLASE" in the last 168 hours. No results for input(s): "AMMONIA" in the last 168 hours. Coagulation Profile: No results for input(s): "INR", "PROTIME" in the last 168 hours. Cardiac Enzymes: No results for input(s): "CKTOTAL", "CKMB", "CKMBINDEX", "TROPONINI" in the last 168 hours. BNP (last 3 results) No results for input(s): "PROBNP" in the last 8760 hours. HbA1C: No results for input(s): "HGBA1C" in the last 72 hours. CBG: No results for input(s): "GLUCAP" in the last 168 hours. Lipid Profile: No results for input(s): "CHOL", "HDL", "LDLCALC", "TRIG", "CHOLHDL", "LDLDIRECT" in the last 72 hours. Thyroid Function Tests: No results for input(s): "TSH", "T4TOTAL", "FREET4", "T3FREE", "THYROIDAB" in the last 72 hours. Anemia Panel: No results for input(s): "VITAMINB12", "FOLATE", "FERRITIN", "TIBC", "IRON",  "RETICCTPCT" in the last 72 hours. Sepsis Labs: No results for input(s): "PROCALCITON", "LATICACIDVEN" in the last 168 hours.  Recent Results (from the past 240 hour(s))  C difficile quick screen w PCR reflex     Status: None   Collection Time: 04/28/22 11:49 AM   Specimen: STOOL  Result Value Ref Range Status   C Diff antigen NEGATIVE NEGATIVE Final   C Diff toxin NEGATIVE NEGATIVE Final   C Diff interpretation No C. difficile detected.  Final    Comment: Performed at [facility], [place] [address] [place], [place] [address]  GI pathogen panel by PCR, stool     Status: None   Collection Time: 04/28/22 11:49 AM   Specimen: Stool  Result Value Ref Range Status   Plesiomonas shigelloides NOT DETECTED NOT DETECTED Final   Yersinia enterocolitica NOT DETECTED NOT DETECTED Final   Vibrio NOT DETECTED NOT DETECTED Final   Enteropathogenic E coli NOT DETECTED NOT DETECTED Final   E coli (ETEC) LT/ST NOT DETECTED NOT DETECTED Final   E coli [id number] by PCR Not applicable  NOT DETECTED Final   Cryptosporidium by PCR NOT DETECTED NOT DETECTED Final   Entamoeba histolytica NOT DETECTED NOT DETECTED Final   Adenovirus F 40/41 NOT DETECTED NOT DETECTED Final   Norovirus GI/GII NOT DETECTED NOT DETECTED Final   Sapovirus NOT DETECTED NOT DETECTED Final    Comment: (NOTE) Performed At: [facility] [place], [place] [id number] Rush Farmer MD [id number]    Vibrio cholerae NOT DETECTED NOT DETECTED Final   Campylobacter by PCR NOT DETECTED NOT DETECTED Final   Salmonella by PCR NOT DETECTED NOT DETECTED Final   E coli (STEC) NOT DETECTED NOT DETECTED Final   Enteroaggregative E coli NOT DETECTED NOT DETECTED Final   Shigella by PCR NOT DETECTED NOT DETECTED Final   Cyclospora cayetanensis NOT DETECTED NOT DETECTED Final   Astrovirus NOT DETECTED NOT DETECTED Final   G lamblia by PCR NOT DETECTED NOT DETECTED Final   Rotavirus A by PCR NOT DETECTED NOT DETECTED  Final         Radiology Studies: No results found.      Scheduled Meds:  atorvastatin  20 mg Oral q1800   feeding supplement  1 Container Oral TID BM   ferrous sulfate  325 mg Oral Q breakfast   fiber supplement (BANATROL TF)  60 mL Oral BID   finasteride  5 mg Oral Daily   latanoprost  1 drop Both Eyes QHS   metoprolol tartrate  12.5 mg Oral BID   ondansetron  4 mg Oral TID AC   pantoprazole  40 mg Oral Daily   predniSONE  5 mg Oral Q breakfast   sertraline  50 mg Oral Daily   tamsulosin  0.8 mg Oral QPC supper   timolol  1 drop Right Eye Daily   traZODone  25 mg Oral QHS   Continuous Infusions:  lactated ringers 50 mL/hr at 05/03/22 1008     LOS: 5 days    Time spent: 35 minutes    Barb Merino, MD [facility] Pager [phone]

## 2022-05-03 NOTE — Progress Notes (Signed)
PT Cancellation Note  Patient Details Name: [name] MRN: [id number] DOB: Aug 18, 1923   Cancelled Treatment:    Reason Eval/Treat Not Completed: Fatigue/lethargy limiting ability to participate. Patient sleeping and daughter in room and states he had visitors this AM and that fatigued him and that he hasn't been sleeping well at night.  She requested to hold PT today.  PT told her we would check back at the beginning of next week and see how he was doing and then go from there.  She was tearful and in agreement.     Galen Manila 05/03/2022, 1:22 PM

## 2022-05-03 NOTE — Plan of Care (Signed)
Patient AOX4, VSS throughout shift. All meds given on time as ordered. Fluids on-going. Pt standby assist to bedside commode. Pt voided in bedside commode. Diminished lungs, IS encouraged. POC maintained, will continue to monitor.  Problem: Education: Goal: Knowledge of General Education information will improve Description: Including pain rating scale, medication(s)/side effects and non-pharmacologic comfort measures Outcome: Progressing   Problem: Health Behavior/Discharge Planning: Goal: Ability to manage health-related needs will improve Outcome: Progressing   Problem: Clinical Measurements: Goal: Ability to maintain clinical measurements within normal limits will improve Outcome: Progressing Goal: Will remain free from infection Outcome: Progressing Goal: Diagnostic test results will improve Outcome: Progressing Goal: Respiratory complications will improve Outcome: Progressing Goal: Cardiovascular complication will be avoided Outcome: Progressing   Problem: Activity: Goal: Risk for activity intolerance will decrease Outcome: Progressing   Problem: Nutrition: Goal: Adequate nutrition will be maintained Outcome: Progressing   Problem: Coping: Goal: Level of anxiety will decrease Outcome: Progressing   Problem: Elimination: Goal: Will not experience complications related to bowel motility Outcome: Progressing Goal: Will not experience complications related to urinary retention Outcome: Progressing   Problem: Pain Managment: Goal: General experience of comfort will improve Outcome: Progressing   Problem: Safety: Goal: Ability to remain free from injury will improve Outcome: Progressing   Problem: Skin Integrity: Goal: Risk for impaired skin integrity will decrease Outcome: Progressing

## 2022-05-04 DIAGNOSIS — C61 Malignant neoplasm of prostate: Secondary | ICD-10-CM | POA: Diagnosis not present

## 2022-05-04 DIAGNOSIS — R52 Pain, unspecified: Secondary | ICD-10-CM

## 2022-05-04 DIAGNOSIS — Z7189 Other specified counseling: Secondary | ICD-10-CM

## 2022-05-04 DIAGNOSIS — R627 Adult failure to thrive: Secondary | ICD-10-CM | POA: Diagnosis not present

## 2022-05-04 DIAGNOSIS — D693 Immune thrombocytopenic purpura: Secondary | ICD-10-CM

## 2022-05-04 DIAGNOSIS — Z79899 Other long term (current) drug therapy: Secondary | ICD-10-CM

## 2022-05-04 DIAGNOSIS — R4589 Other symptoms and signs involving emotional state: Secondary | ICD-10-CM

## 2022-05-04 DIAGNOSIS — Z515 Encounter for palliative care: Secondary | ICD-10-CM

## 2022-05-04 DIAGNOSIS — Z66 Do not resuscitate: Secondary | ICD-10-CM

## 2022-05-04 DIAGNOSIS — C7951 Secondary malignant neoplasm of bone: Secondary | ICD-10-CM

## 2022-05-04 LAB — CBC WITH DIFFERENTIAL/PLATELET
Abs Immature Granulocytes: 1.3 10*3/uL — ABNORMAL HIGH (ref 0.00–0.07)
Basophils Absolute: 0.2 10*3/uL — ABNORMAL HIGH (ref 0.0–0.1)
Basophils Relative: 1 %
Eosinophils Absolute: 0 10*3/uL (ref 0.0–0.5)
Eosinophils Relative: 0 %
HCT: 26.7 % — ABNORMAL LOW (ref 39.0–52.0)
Hemoglobin: 8.2 g/dL — ABNORMAL LOW (ref 13.0–17.0)
Lymphocytes Relative: 10 %
Lymphs Abs: 1.8 10*3/uL (ref 0.7–4.0)
MCH: 25.4 pg — ABNORMAL LOW (ref 26.0–34.0)
MCHC: 30.7 g/dL (ref 30.0–36.0)
MCV: 82.7 fL (ref 80.0–100.0)
Monocytes Absolute: 1.3 10*3/uL — ABNORMAL HIGH (ref 0.1–1.0)
Monocytes Relative: 7 %
Myelocytes: 7 %
Neutro Abs: 13.6 10*3/uL — ABNORMAL HIGH (ref 1.7–7.7)
Neutrophils Relative %: 75 %
Platelets: 27 10*3/uL — CL (ref 150–400)
RBC: 3.23 MIL/uL — ABNORMAL LOW (ref 4.22–5.81)
RDW: 21.6 % — ABNORMAL HIGH (ref 11.5–15.5)
WBC: 18.1 10*3/uL — ABNORMAL HIGH (ref 4.0–10.5)
nRBC: 0.4 % — ABNORMAL HIGH (ref 0.0–0.2)

## 2022-05-04 LAB — COMPREHENSIVE METABOLIC PANEL
ALT: 16 U/L (ref 0–44)
AST: 26 U/L (ref 15–41)
Albumin: 1.9 g/dL — ABNORMAL LOW (ref 3.5–5.0)
Alkaline Phosphatase: 852 U/L — ABNORMAL HIGH (ref 38–126)
Anion gap: 6 (ref 5–15)
BUN: 14 mg/dL (ref 8–23)
CO2: 20 mmol/L — ABNORMAL LOW (ref 22–32)
Calcium: 6.9 mg/dL — ABNORMAL LOW (ref 8.9–10.3)
Chloride: 108 mmol/L (ref 98–111)
Creatinine, Ser: 1.07 mg/dL (ref 0.61–1.24)
GFR, Estimated: >60 mL/min (ref >60)
Glucose, Bld: 93 mg/dL (ref 70–99)
Potassium: 3.2 mmol/L — ABNORMAL LOW (ref 3.5–5.1)
Sodium: 134 mmol/L — ABNORMAL LOW (ref 135–145)
Total Bilirubin: 0.6 mg/dL (ref 0.3–1.2)
Total Protein: 4.2 g/dL — ABNORMAL LOW (ref 6.5–8.1)

## 2022-05-04 LAB — PHOSPHORUS: Phosphorus: 2.5 mg/dL (ref 2.5–4.6)

## 2022-05-04 LAB — MAGNESIUM: Magnesium: 1.4 mg/dL — ABNORMAL LOW (ref 1.7–2.4)

## 2022-05-04 MED ORDER — GLYCOPYRROLATE 1 MG PO TABS: 1.0000 mg | ORAL_TABLET | ORAL | Status: DC | PRN

## 2022-05-04 MED ORDER — HYDROMORPHONE HCL 1 MG/ML PO LIQD
0.2500 mg | ORAL | Status: DC | PRN
  Administered 2022-05-04 – 2022-05-06 (×4): 0.25 mg via ORAL
  Filled 2022-05-04 (×4): qty 1

## 2022-05-04 MED ORDER — MAGNESIUM SULFATE 4 GM/100ML IV SOLN
4.0000 g | Freq: Once | INTRAVENOUS | Status: DC
  Filled 2022-05-04: qty 100

## 2022-05-04 MED ORDER — LORAZEPAM 2 MG/ML PO CONC
0.2500 mg | ORAL | Status: DC | PRN
  Administered 2022-05-08 – 2022-05-09 (×4): 0.26 mg via ORAL
  Filled 2022-05-04 (×4): qty 1

## 2022-05-04 MED ORDER — GABAPENTIN 100 MG PO CAPS
100.0000 mg | ORAL_CAPSULE | Freq: Every day | ORAL | Status: DC
  Administered 2022-05-04 – 2022-05-05 (×2): 100 mg via ORAL
  Filled 2022-05-04 (×2): qty 1

## 2022-05-04 MED ORDER — TRAZODONE HCL 50 MG PO TABS: 50.0000 mg | ORAL_TABLET | Freq: Every day | ORAL | Status: DC

## 2022-05-04 MED ORDER — POTASSIUM CHLORIDE 10 MEQ/100ML IV SOLN: 10.0000 meq | INTRAVENOUS | Status: DC

## 2022-05-04 NOTE — Discharge Instructions (Signed)
[facility]  [place]   [facility]  [phone]

## 2022-05-04 NOTE — Progress Notes (Signed)
PROGRESS NOTE    [name]  [id number] DOB: 12-30-1923 DOA: 04/28/2022 PCP: [name], MD    Brief Narrative:  89 y.o. male with medical history significant of hormone refractory prostate cancer-completed a course of palliative radiation to his lumbar spine on 2/13. He subsequently started developing nausea, diarrhea and generalized weakness.  He was sent to the hospital as a direct admit by his oncologist. After completing palliative radiation to his lumbar spine, he started to develop nausea, vomiting, diarrhea.  Initially, thought it was a stomach bug, but has not been able to keep any nutrition down for about 10 days.  Admitted with dehydration and poor oral intake. Remains in very poor clinical status with no appropriate improvement.  Assessment & Plan:   Nausea vomiting, poor oral intake, chronic diarrhea: Probably multifactorial.  C. difficile negative.  GI pathogen panel negative.  Getting fluid retention with IV fluids.  Discontinue IV fluids today. He is developing peripheral edema.  1 dose of Lasix today. Appetite is somewhat improved, however remains debilitated and continues to have diarrhea.  CT scan abdomen pelvis without any significant findings. Continue to encourage oral intake, nutritional supplements, oral fluids.  Can use Imodium. Monitor for diet tolerance. Can use Zofran before meals.  Trying scheduled oral Zofran before meals.  Metastatic prostate cancer: Exhausted treatment.  Recommended home hospice and surveillance. Back pain improved with radiation therapy however caused poor appetite and nausea with diarrhea. He does occasionally have pain, controlled with tramadol now.  Chronic ITP: Platelets downtrending now.  Paroxysmal A-fib: Sinus rhythm.  On Lopressor.  Not anticoagulation candidate.  Electrolytes:  Magnesium 1.4, replace with magnesium rider. Potassium, replaced with IV potassium.  Chronic pain and depression: Stable on tramadol,  Zoloft.  Goal of care: Multiple goal of care discussion with patient and wife.  His 2 daughters are also involved, however they are not on healthcare power of attorney. Patient remains very frail debilitated and expresses his concern that he may not be doing well moving forward. 3/3, detailed discussions with patient, daughter at the bedside.  Patient himself expressing concern whether he is ready for death.  Discussed his cancer doctors recommendation and this provider's recommendation that he is appropriate for home hospice level of care and palliation. Called and discussed with patient's wife, will meet in the afternoon. Continue current level of care now. Discussed with palliative care physician, will arrange family meeting and facilitate patient's wishes to be comfortable.  Will recommend home with hospice.   DVT prophylaxis: SCDs Start: 04/28/22 1714   Code Status: DNR Family Communication: 2 daughters at the bedside.  Wife on the phone. Disposition Plan: Status is: Inpatient Remains inpatient appropriate because: Diet tolerance.  Improvement of diarrhea.     Consultants:  Oncology Palliative care  Procedures:  None  Antimicrobials:  None   Subjective:  Patient seen and examined.  Trazodone 25 mg did not help.  He went to bathroom multiple times overnight.  He is tired.  He expresses concern that he may not have much to live.  He is very open about talking about his death.  He is very frank about conversation that the way he is not doing well he may not survive longer.  He was asking when he is going to meet with palliative care team.  Objective: Vitals:   05/03/22 0622 05/03/22 1339 05/03/22 2102 05/04/22 0636  BP: (!) 129/56 (!) 123/56 122/72 (!) 128/56  Pulse: 68 66 74 72  Resp: 18 18  15 18  Temp: (!) 97.3 F (36.3 C)  97.9 F (36.6 C) 98.4 F (36.9 C)  TempSrc: Oral  Oral Oral  SpO2: 95% 93% 97% 98%  Weight:      Height:        Intake/Output Summary (Last  24 hours) at 05/04/2022 1137 Last data filed at 05/04/2022 1000 Gross per 24 hour  Intake 2194.49 ml  Output 100 ml  Net 2094.49 ml    Filed Weights   04/28/22 1757  Weight: 66.2 kg    Examination:  General exam: Frail and debilitated.  He is alert oriented x 4.  Tired and lethargic.  Appropriately anxious but very pleasant to keep up conversation. Respiratory system: No added sounds. Cardiovascular system: S1 & S2 heard, RRR.  2+ bilateral pedal edema. Gastrointestinal system: Soft.  Nontender.   Central nervous system: Alert and oriented. No focal neurological deficits. Extremities: Symmetric 5 x 5 power.  Profound generalized weakness.    Data Reviewed: I have personally reviewed following labs and imaging studies  CBC: Recent Labs  Lab 04/28/22 1031 04/29/22 0628 04/30/22 0627 05/04/22 0730  WBC 9.2 10.7* 9.4 18.1*  NEUTROABS 3.5  --  5.2 PENDING  HGB 9.4* 7.7* 8.1* 8.2*  HCT 30.1* 25.7* 27.2* 26.7*  MCV 80.5 81.3 83.7 82.7  PLT 32* 39* 38* 27*    Basic Metabolic Panel: Recent Labs  Lab 04/28/22 1031 04/29/22 0628 04/30/22 0627 05/04/22 0730  NA 139 138 137 134*  K 4.0 3.4* 3.8 3.2*  CL 109 112* 110 108  CO2 19* 17* 17* 20*  GLUCOSE 101* 80 110* 93  BUN 26* 23 26* 14  CREATININE 1.12 1.00 1.00 1.07  CALCIUM 7.9* 7.2* 7.6* 6.9*  MG  --   --  1.7 1.4*  PHOS  --   --   --  2.5    GFR: Estimated Creatinine Clearance: 35.1 mL/min (by C-G formula based on SCr of 1.07 mg/dL). Liver Function Tests: Recent Labs  Lab 04/28/22 1031 04/29/22 0628 05/04/22 0730  AST '28 19 26  '$ ALT '10 9 16  '$ ALKPHOS 1,195* 913* 852*  BILITOT 0.5 1.0 0.6  PROT 5.3* 4.3* 4.2*  ALBUMIN 3.1* 2.1* 1.9*    No results for input(s): "LIPASE", "AMYLASE" in the last 168 hours. No results for input(s): "AMMONIA" in the last 168 hours. Coagulation Profile: No results for input(s): "INR", "PROTIME" in the last 168 hours. Cardiac Enzymes: No results for input(s): "CKTOTAL", "CKMB",  "CKMBINDEX", "TROPONINI" in the last 168 hours. BNP (last 3 results) No results for input(s): "PROBNP" in the last 8760 hours. HbA1C: No results for input(s): "HGBA1C" in the last 72 hours. CBG: No results for input(s): "GLUCAP" in the last 168 hours. Lipid Profile: No results for input(s): "CHOL", "HDL", "LDLCALC", "TRIG", "CHOLHDL", "LDLDIRECT" in the last 72 hours. Thyroid Function Tests: No results for input(s): "TSH", "T4TOTAL", "FREET4", "T3FREE", "THYROIDAB" in the last 72 hours. Anemia Panel: No results for input(s): "VITAMINB12", "FOLATE", "FERRITIN", "TIBC", "IRON", "RETICCTPCT" in the last 72 hours. Sepsis Labs: No results for input(s): "PROCALCITON", "LATICACIDVEN" in the last 168 hours.  Recent Results (from the past 240 hour(s))  C difficile quick screen w PCR reflex     Status: None   Collection Time: 04/28/22 11:49 AM   Specimen: STOOL  Result Value Ref Range Status   C Diff antigen NEGATIVE NEGATIVE Final   C Diff toxin NEGATIVE NEGATIVE Final   C Diff interpretation No C. difficile detected.  Final  Comment: Performed at [facility], [place] [address], [place], [place] [address]  GI pathogen panel by PCR, stool     Status: None   Collection Time: 04/28/22 11:49 AM   Specimen: Stool  Result Value Ref Range Status   Plesiomonas shigelloides NOT DETECTED NOT DETECTED Final   Yersinia enterocolitica NOT DETECTED NOT DETECTED Final   Vibrio NOT DETECTED NOT DETECTED Final   Enteropathogenic E coli NOT DETECTED NOT DETECTED Final   E coli (ETEC) LT/ST NOT DETECTED NOT DETECTED Final   E coli [id number] by PCR Not applicable NOT DETECTED Final   Cryptosporidium by PCR NOT DETECTED NOT DETECTED Final   Entamoeba histolytica NOT DETECTED NOT DETECTED Final   Adenovirus F 40/41 NOT DETECTED NOT DETECTED Final   Norovirus GI/GII NOT DETECTED NOT DETECTED Final   Sapovirus NOT DETECTED NOT DETECTED Final    Comment: (NOTE) Performed At: [facility] [place], [place] [id number] Rush Farmer MD [id number]    Vibrio cholerae NOT DETECTED NOT DETECTED Final   Campylobacter by PCR NOT DETECTED NOT DETECTED Final   Salmonella by PCR NOT DETECTED NOT DETECTED Final   E coli (STEC) NOT DETECTED NOT DETECTED Final   Enteroaggregative E coli NOT DETECTED NOT DETECTED Final   Shigella by PCR NOT DETECTED NOT DETECTED Final   Cyclospora cayetanensis NOT DETECTED NOT DETECTED Final   Astrovirus NOT DETECTED NOT DETECTED Final   G lamblia by PCR NOT DETECTED NOT DETECTED Final   Rotavirus A by PCR NOT DETECTED NOT DETECTED Final         Radiology Studies: No results found.      Scheduled Meds:  atorvastatin  20 mg Oral q1800   feeding supplement  1 Container Oral TID BM   ferrous sulfate  325 mg Oral Q breakfast   fiber supplement (BANATROL TF)  60 mL Oral BID   finasteride  5 mg Oral Daily   latanoprost  1 drop Both Eyes QHS   metoprolol tartrate  12.5 mg Oral BID   ondansetron  4 mg Oral TID AC   pantoprazole  40 mg Oral Daily   predniSONE  5 mg Oral Q breakfast   sertraline  50 mg Oral Daily   tamsulosin  0.8 mg Oral QPC supper   timolol  1 drop Right Eye Daily   traZODone  50 mg Oral QHS   Continuous Infusions:  lactated ringers 50 mL/hr at 05/03/22 2200   magnesium sulfate bolus IVPB     potassium chloride       LOS: 6 days    Time spent: 35 minutes    Barb Merino, MD [facility] Pager [phone]

## 2022-05-04 NOTE — Progress Notes (Signed)
Manufacturing engineer [facility]) Hospital Liaison Note   MSW received referral via [place] from PMT provider/Dr. Vinetta Bergamo reporting that family would like to receive [facility] services at home upon discharge (TOC/Tunisha also present in [place] chat & aware of referral).  MSW spoke with spouse/[name] to initiate education related to hospice philosophy, services, and team approach to care. [name] has requested to meet with [facility] liaison at bedside @ 11 am so that additional family can be present for evaluation before proceeding with referral. MSW voiced understanding and call concludes.   At this time, patient is not under review for [facility] services as family continues to have ongoing [place] discussions. MSW to touch base with patient/family on 3.4.24 @ 11am at bedside.  [facility] information and contact numbers given to family & above information shared with TOC.   Please call with any questions/concerns.    Thank you for the opportunity to participate in this patient's care.   Phillis Haggis, MSW [facility] Liaison  [phone]

## 2022-05-04 NOTE — Plan of Care (Signed)
Patient AOX4, VSS throughout shift. All meds given on time as ordered. Fluids on-going. Pt standby assist to bedside commode. Pt voided in bedside commode. Diminished lungs, IS encouraged. POC maintained, will continue to monitor.   Problem: Education: Goal: Knowledge of General Education information will improve Description: Including pain rating scale, medication(s)/side effects and non-pharmacologic comfort measures Outcome: Progressing   Problem: Health Behavior/Discharge Planning: Goal: Ability to manage health-related needs will improve Outcome: Progressing   Problem: Clinical Measurements: Goal: Ability to maintain clinical measurements within normal limits will improve Outcome: Progressing Goal: Will remain free from infection Outcome: Progressing Goal: Diagnostic test results will improve Outcome: Progressing Goal: Respiratory complications will improve Outcome: Progressing Goal: Cardiovascular complication will be avoided Outcome: Progressing   Problem: Activity: Goal: Risk for activity intolerance will decrease Outcome: Progressing   Problem: Nutrition: Goal: Adequate nutrition will be maintained Outcome: Progressing   Problem: Coping: Goal: Level of anxiety will decrease Outcome: Progressing   Problem: Elimination: Goal: Will not experience complications related to bowel motility Outcome: Progressing Goal: Will not experience complications related to urinary retention Outcome: Progressing   Problem: Pain Managment: Goal: General experience of comfort will improve Outcome: Progressing   Problem: Safety: Goal: Ability to remain free from injury will improve Outcome: Progressing   Problem: Skin Integrity: Goal: Risk for impaired skin integrity will decrease Outcome: Progressing

## 2022-05-04 NOTE — Progress Notes (Signed)
Family at bedside most the day. Patient using walker to transfer to [facility]. Sitting up in chair for 2 hours.

## 2022-05-04 NOTE — Consult Note (Signed)
Consultation Note Date: 05/04/2022   Patient Name: [name]  DOB: Feb 03, 1924  MRN: [id number]  Age / Sex: 89 y.o., male   PCP: [name], MD Referring Physician: Barb Merino, MD  Reason for Consultation: Establishing goals of care     Chief Complaint/History of Present Illness:  Patient is a 89 year old male with a past medical history of hormone refractory prostate cancer with metastatic disease to bone status post radiation (with complications), chronic ITP, A-fib, and depression who was admitted on 04/28/2022 for management of nausea, diarrhea, and generalized weakness.  Patient was sent to hospital as a direct admit from his oncologist.  Since admission, patient has received medical management for nausea, vomiting, poor oral intake, and chronic diarrhea.  Despite aggressive medical management, medical status has continued to deteriorate.  Palliative medicine team consulted to assist with complex medical decision making.  Extensive review of EMR prior to seeing patient.  Discussed care with hospitalist prior to seeing patient as well for medical updates.  Presented to bedside and introduced myself and role palliative medicine team in patient's care.  Patient's wife, Alvester Chou, was present at bedside during entirety of conversation; patient agreeing with this.  Patient is easily able to participate in conversation himself.   Spent extensive time discussing patient's medical journey up until this point.  Patient is able to voice himself his trajectory and that this is no longer quality of life to him.  Patient's goals for medical care moving forward include discontinuing medications that are no longer allowing him to have a good quality of life, not coming back to the hospital, and focusing on time at home with loved ones.  Patient is excepting that he is reaching the end of life and understands this is God's plan.  Patient does not want to hasten God's plan, which was acknowledged and  respected, though wants to be more comfortable at home to enjoy the time God is allowing him.  Acknowledged and respected all of his wishes for care.  In light of his wishes for care, we discussed going home with hospice.  Explained what this would and would not entail.  Discussed that with home hospice, family support is the primary caregiver.  Explained that if additional help is needed, family can seek hiring care with out-of-pocket cost.  Patient also able to verbalize that he would want his last days at home.  Patient never wants to go to [facility].  Acknowledged and respected this. Also discussed transition to comfort focused care at this time.  Will discontinue lab work, imaging, and medications that are no longer focused on symptom management.  Patient and wife agreed with plan transition to comfort focused care at this time and work to get patient home with hospice.  Patient and wife would elect to choose [facility] or care hospice at this time. Noted would inform [facility]OC and [facility] her care liaison regarding his decision.  She has also had urinary issues.  Discussed that patient does not have to have Foley placed at this time though Foley placement may be needed in the future to help assist in his quality of life and maintain dignity.  Patient agreeing with this and continued conversations regarding it.  Noted hospice could continue these conversations at home.  Wife also able to and allowing this provider there is lots of family drama since they are a blended family and both on their second marriages.  Acknowledged this and that time is now to focus on quality  time for the patient knowing he is at the end of his life.  They both acknowledged this.  Patient himself gave me permission to talk to his daughters about medical care.  Patient hoping to have meeting today himself with all family members to inform him of his decision to go home with hospice.  Also discussed patient's symptom  management.  Patient not able to sleep with worsening pain.  Notes has not tolerated morphine or oxycodone in the past.  Discussed starting low-dose oral Dilaudid solution every 4 hours as needed.  Will also start patient on gabapentin 100 mg nightly.  Patient and wife agreeing with this plan.  All questions answered at that time.  Thanked patient and wife for allowing me to visit with them today.  Patient voiced great appreciation for plan of care.  After this visit, then able to go speak with patient's daughters who were in the waiting area.  And patient had given permission for me to talk with them.  Updated them regarding plan of care plan to go home with hospice.  Again acknowledged that even though there has been family drama, priority at this time should be patient's comfort and spending time knowing he is reaching the end of his life.  Discussed all above as had with the patient.  All questions answered at that time.  Noted this provider would continue to follow along to assist in discussions.  Then able to speak with [facility] liaison.  Patient wants to meet with all of his family and [facility] liaison tomorrow at 13 AM.  Noted this provider would attempt to attend if able.  Updated care team regarding plan of care.  Primary Diagnoses  Present on Admission:  Dehydration  Chronic ITP (idiopathic thrombocytopenia) (HCC)  Dyslipidemia  Hypertension  Paroxysmal atrial fibrillation (HCC)  Prostate cancer metastatic to bone (HCC)  Failure to thrive in adult  Nausea  Diarrhea   Past Medical History:  Diagnosis Date   Acute appendicitis    Anemia    Anxiety    Arthritis    "back" (03/14/2014)   Blood dyscrasia    hairy cell leukemia   CAD (coronary artery disease)    a. 03/14/14  s/p overlapping DES x2 to mid-distal RCA.   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    Colon cancer ([place]) 1984   Compression fracture of lumbar spine, non-traumatic (HCC)    DJD (degenerative joint  disease) of lumbar spine    Dyslipidemia    Dyspnea    Elevated PSA    GERD (gastroesophageal reflux disease)    Glaucoma    Hairy cell leukemia ([place]) dx'd 1980   Heart murmur    History of blood transfusion    "several; related to hairy cell leukemia & tx "   History of stomach ulcers 1968   Hypertension    Leukemia, hairy cell ([place])    Malnutrition ([place])    Osteoarthritis    Osteoporosis    Pneumonia    S/P TAVR (transcatheter aortic valve replacement) 09/06/2019   s/p TAVR with a 26 mm Edwards S3U via the TF approach by Dr. Angelena Form and Cyndia Bent.    Severe aortic stenosis    s/p tavr   Skin cancer of face    Social History   Socioeconomic History   Marital status: Married    Spouse name: Not on file   Number of children: Not on file   Years of education: 16   Highest education level: Bachelor's degree (  e.g., BA, AB, BS)  Occupational History   Occupation: Retired  Tobacco Use   Smoking status: Former    Packs/day: 0.25    Years: 1.00    Total pack years: 0.25    Types: Cigarettes, Cigars   Smokeless tobacco: Never   Tobacco comments:    occasional social smoker during college.  Vaping Use   Vaping Use: Never used  Substance and Sexual Activity   Alcohol use: Yes    Alcohol/week: 9.0 standard drinks of alcohol    Types: 2 Glasses of wine, 2 Shots of liquor, 5 Standard drinks or equivalent per week    Comment: socially   Drug use: No   Sexual activity: Not Currently  Other Topics Concern   Not on file  Social History Narrative   Not on file   Social Determinants of Health   Financial Resource Strain: Not on file  Food Insecurity: No Food Insecurity (04/28/2022)   Hunger Vital Sign    Worried About Running Out of Food in the Last Year: Never true    Ran Out of Food in the Last Year: Never true  Transportation Needs: No Transportation Needs (04/28/2022)   PRAPARE - Hydrologist (Medical): No    Lack of Transportation  (Non-Medical): No  Physical Activity: Not on file  Stress: Not on file  Social Connections: Not on file   Family History  Problem Relation Age of Onset   Congestive Heart Failure Father 79   Stroke Mother    Diabetes Mellitus II Brother    Prostate cancer Brother    Heart Problems Brother        CABG   Scheduled Meds:  atorvastatin  20 mg Oral q1800   feeding supplement  1 Container Oral TID BM   ferrous sulfate  325 mg Oral Q breakfast   fiber supplement (BANATROL TF)  60 mL Oral BID   finasteride  5 mg Oral Daily   latanoprost  1 drop Both Eyes QHS   metoprolol tartrate  12.5 mg Oral BID   ondansetron  4 mg Oral TID AC   pantoprazole  40 mg Oral Daily   predniSONE  5 mg Oral Q breakfast   sertraline  50 mg Oral Daily   tamsulosin  0.8 mg Oral QPC supper   timolol  1 drop Right Eye Daily   traZODone  50 mg Oral QHS   Continuous Infusions:  lactated ringers 50 mL/hr at 05/03/22 2200   PRN Meds:.acetaminophen, albuterol, loperamide, nitroGLYCERIN, traMADol Allergies  Allergen Reactions   Antazoline Anaphylaxis   Anesthetics, Amide Other (See Comments)    Cannot urinate afterwards   Calcitonin ([place]) Other (See Comments)    Rhinitis   Antihistamines, Chlorpheniramine-Type Anxiety and Other (See Comments)    Nervous, jittery   Diphenhydramine Anxiety   Loratadine Anxiety   Sulfa Antibiotics Rash   CBC:    Component Value Date/Time   WBC 18.1 (H) 05/04/2022 0730   HGB 8.2 (L) 05/04/2022 0730   HGB 9.4 (L) 04/28/2022 1031   HGB 11.9 (L) 08/15/2019 0808   HCT 26.7 (L) 05/04/2022 0730   HCT 35.2 (L) 08/15/2019 0808   PLT 27 (LL) 05/04/2022 0730   PLT 32 (L) 04/28/2022 1031   PLT 11 (LL) 08/15/2019 0808   MCV 82.7 05/04/2022 0730   MCV 79 08/15/2019 0808   NEUTROABS PENDING 05/04/2022 0730   NEUTROABS 3.2 09/17/2016 1612   LYMPHSABS PENDING 05/04/2022 0730   LYMPHSABS  1.3 09/17/2016 1612   MONOABS PENDING 05/04/2022 0730   EOSABS PENDING 05/04/2022 0730    EOSABS 0.2 09/17/2016 1612   BASOSABS PENDING 05/04/2022 0730   BASOSABS 0.1 09/17/2016 1612   Comprehensive Metabolic Panel:    Component Value Date/Time   NA 134 (L) 05/04/2022 0730   NA 138 07/31/2021 1106   K 3.2 (L) 05/04/2022 0730   CL 108 05/04/2022 0730   CO2 20 (L) 05/04/2022 0730   BUN 14 05/04/2022 0730   BUN 19 07/31/2021 1106   CREATININE 1.07 05/04/2022 0730   CREATININE 1.12 04/28/2022 1031   CREATININE 1.06 12/10/2015 0949   GLUCOSE 93 05/04/2022 0730   CALCIUM 6.9 (L) 05/04/2022 0730   AST 26 05/04/2022 0730   AST 28 04/28/2022 1031   ALT 16 05/04/2022 0730   ALT 10 04/28/2022 1031   ALKPHOS 852 (H) 05/04/2022 0730   BILITOT 0.6 05/04/2022 0730   BILITOT 0.5 04/28/2022 1031   PROT 4.2 (L) 05/04/2022 0730   ALBUMIN 1.9 (L) 05/04/2022 0730    Physical Exam: Vital Signs: BP (!) 128/56 (BP Location: Left Arm)   Pulse 72   Temp 98.4 F (36.9 C) (Oral)   Resp 18   Ht '5\' 5"'$  (1.651 m)   Wt 66.2 kg   SpO2 98%   BMI 24.30 kg/m  SpO2: SpO2: 98 % O2 Device: O2 Device: Room Air O2 Flow Rate:   Intake/output summary:  Intake/Output Summary (Last 24 hours) at 05/04/2022 1058 Last data filed at 05/03/2022 2318 Gross per 24 hour  Intake 2194.49 ml  Output 100 ml  Net 2094.49 ml   LBM: Last BM Date : 05/29/22 Baseline Weight: Weight: 66.2 kg Most recent weight: Weight: 66.2 kg  General: NAD, alert, chronically ill appearing, frail, pleasant  Eyes:no drainage notes HENT: moist mucous membranes Cardiovascular: RRR, no edema in LE b/l Respiratory: no increased work of breathing noted, not in respiratory distress Abdomen: not distended Extremities: muscle wasting present in all extremities   Skin: no rashes or lesions on visible skin Neuro: A&Ox4, following commands easily Psych: appropriately answers all questions          Palliative Performance Scale: 40%              Additional Data Reviewed: Recent Labs    05/04/22 0730  WBC 18.1*  HGB 8.2*  PLT  27*  NA 134*  BUN 14  CREATININE 1.07    Imaging: CT ABDOMEN PELVIS W CONTRAST CLINICAL DATA:  Abdominal pain.  History of prostate cancer.  EXAM: CT ABDOMEN AND PELVIS WITH CONTRAST  TECHNIQUE: Multidetector CT imaging of the abdomen and pelvis was performed using the standard protocol following bolus administration of intravenous contrast.  RADIATION DOSE REDUCTION: This exam was performed according to the departmental dose-optimization program which includes automated exposure control, adjustment of the mA and/or kV according to patient size and/or use of iterative reconstruction technique.  CONTRAST:  49m OMNIPAQUE IOHEXOL 300 MG/ML  SOLN  COMPARISON:  Virtual colonoscopy 06/25/2021  FINDINGS: Lower chest: Chronic interstitial opacities in the lung bases appear unchanged. Left perihilar/left upper lobe pulmonary nodule present measuring 3.8 x 3.2 cm.  Hepatobiliary: No focal liver abnormality is seen. No gallstones, gallbladder wall thickening, or biliary dilatation.  Pancreas: Scattered pancreatic calcifications are present compatible with chronic pancreatitis. No acute inflammation.  Spleen: Surgically absent.  Adrenals/Urinary Tract: There is no hydronephrosis or perinephric fluid. There is a 2.4 cm cyst in the right kidney. The adrenal glands and  bladder are within normal limits.  Stomach/Bowel: Stomach is within normal limits. No evidence of bowel wall thickening, distention, or inflammatory changes. The appendix is not visualized.  Vascular/Lymphatic: Aortic atherosclerosis. No enlarged abdominal or pelvic lymph nodes.  Reproductive: Prostate gland is enlarged, unchanged.  Other: No abdominal wall hernia or abnormality. No abdominopelvic ascites. There is mild body wall edema.  Musculoskeletal: Diffuse osseous metastatic disease is progressed no acute fractures are seen.  IMPRESSION: 1. No acute localizing process in the abdomen or pelvis. 2.  Diffuse osseous metastatic disease is progressed. 3. Left perihilar/left upper lobe pulmonary nodule worrisome for metastatic disease. 4. Mild body wall edema. 5. Stable right renal cysts.  No follow-up imaging recommended.  Aortic Atherosclerosis (ICD10-I70.0).  Electronically Signed   By: Ronney Asters M.D.   On: 04/29/2022 21:58    I personally reviewed recent imaging.   Palliative Care Assessment and Plan Summary of Established Goals of Care and Medical Treatment Preferences   Patient is a 89 year old male with a past medical history of hormone refractory prostate cancer with metastatic disease to bone status post radiation (with complications), chronic ITP, A-fib, and depression who was admitted on 04/28/2022 for management of nausea, diarrhea, and generalized weakness.  Patient was sent to hospital as a direct admit from his oncologist.  Since admission, patient has received medical management for nausea, vomiting, poor oral intake, and chronic diarrhea.  Despite aggressive medical management, medical status has continued to deteriorate.  Palliative medicine team consulted to assist with complex medical decision making.  # Complex medical decision making/goals of care  -Extensive of conversation with patient, patient's wife, and patient's children as documented in detail above in HPI.  Patient wants to transition to comfort focused care care at this time.  Patient interested in going home with [facility] hospice.  Discontinue interventions no no longer focused on patient's comfort.  -  Code Status: DNR   # Symptom management  -Pain/Dyspnea   -Start dilaudid po solution 0.'25mg'$  q4hrs prn. Patient did not want morphine or oxycodone. Consider bowel regimen once diarrhea resolved with use of opioids.    -Start gabapentin '100mg'$  qhs.   -Continue daily prednisone   Anxiety   -Start ativan solution po 0.'25mg'$  q4hrs prn   Urinary Rentention   -Continue Proscar and Flomax. Did discuss may  need foley when appropraite for comfort and dignity at the end of life.    # Psycho-social/Spiritual Support:  - Support System: wife (second marriage), daughters   # Discharge Planning:  Home with Hospice  Thank you for allowing the palliative care team to participate in the care [name].  Chelsea Aus, DO Palliative Care Provider PMT # [phone]  If patient remains symptomatic despite maximum doses, please call PMT at [phone] between 0700 and 1900. Outside of these hours, please call attending, as PMT does not have night coverage.  This provider spent a total of 95 minutes providing patient's care.  Includes review of EMR, discussing care with other staff members involved in patient's medical care, obtaining relevant history and information from patient and/or patient's family, and personal review of imaging and lab work. Greater than 50% of the time was spent counseling and coordinating care related to the above assessment and plan.    *Please note that this is a verbal dictation therefore any spelling or grammatical errors are due to the "[place] [employer]" system interpretation.

## 2022-05-04 NOTE — Progress Notes (Signed)
  Daily Progress Note   Patient Name: [name]       Date: 05/04/2022 DOB: 12-Aug-1923  Age: 89 y.o. MRN#: [id number] Attending Physician: Barb Merino, MD Primary Care Physician: [name], MD Admit Date: 04/28/2022 Length of Stay: 6 days  Will place full consult note as soon as able. Extensive conversation with patient and family today. Transitioning to comfort focused care at this time. Medications will be adjusted accordingly. Plan to discharge home with hospice via [facility] once coordination complete.   Chelsea Aus, DO Palliative Care Provider PMT # [phone]

## 2022-05-05 ENCOUNTER — Inpatient Hospital Stay: Payer: Medicare Other

## 2022-05-05 DIAGNOSIS — Z515 Encounter for palliative care: Secondary | ICD-10-CM | POA: Diagnosis not present

## 2022-05-05 DIAGNOSIS — R627 Adult failure to thrive: Secondary | ICD-10-CM | POA: Diagnosis not present

## 2022-05-05 DIAGNOSIS — D693 Immune thrombocytopenic purpura: Secondary | ICD-10-CM | POA: Diagnosis not present

## 2022-05-05 DIAGNOSIS — C61 Malignant neoplasm of prostate: Secondary | ICD-10-CM | POA: Diagnosis not present

## 2022-05-05 NOTE — Progress Notes (Signed)
Daily Progress Note   Patient Name: [name]       Date: 05/05/2022 DOB: 1923-10-27  Age: 89 y.o. MRN#: [id number] Attending Physician: Douglas Merino, MD Primary Care Physician: Douglas Lopes, MD Admit Date: 04/28/2022 Length of Stay: 7 days  Reason for Consultation/Follow-up: Establishing goals of care and Symptom Management  Subjective:   CC: Patient notes great improvement in sleep and pain management today. Following up regarding complex medical decision making and symptom management.   Subjective: Discussed care with [facility] liaison who planned to meet with family at [place] so this provider was able to join as well.  At time of EMR review, patient had received gabapentin 100 mg nightly and oral Dilaudid solution 0.25 mg x 2 doses.  Patient seen sitting up in bedside chair.  Patient appears much more comfortable and in good spirits.  Patient able to discuss how he feels so much better today after he got a good night of sleep and his pain is managed.  Patient greatly appreciated to have of these interventions.  Multiple family members present at bedside for meeting today.  Patient gave provider and [facility] liaison permission to talk to his family members regarding his care moving forward separately.  Patient has already decided himself he will be going home with hospice.  Patient acknowledges that he feels he made the right decision for him.  Then able to meet separately with his wife-[name], patient's friend- [name], and 2 out of 3 of patient's daughters.  So present for this meeting was [facility] liaison, [name].  We again extensively discussed patient's goals for medical care moving forward and how to best coordinate support regarding this.  She denies able to provide wonderful details regarding [facility] hospice and the support they could provide at home.  Spent time answering questions regarding this.  We all discussed that patient will need 24/7 assistance at home due to his weakness.   Discussed importance of hiring caregivers to allow family to spend time visiting with patient, not necessarily having to care for patient.  Also discussed importance of coordination between all family members about who would be visiting when in addition to when hospice will be visiting.  [name] will assist with counseling and coordination to make sure all parties allowed time to visit with patient.  There are multiple family dynamics going on at this time, though noted importance at this time should be focusing on providing supportive and loving care to the patient as he is reaching the end of his life.  Acknowledged that all care for patient should be coming from a loving decision at this time.  Patient himself has opted for home with hospice and we will acknowledge and respect his wishes for medical care.  We discussed management of medications moving forward and how hospice will assist with continued evaluation to determine titration of medications.  Discussed that for transferring home with hospice, patient will need an ambulance ride.  Discussed that patient may eventually need a Foley catheter placed for his comfort.  He asked this provider update him regarding this and noted would be happy to.  Spent extensive time providing emotional support to all family members.  All questions answered at that time.  Able to follow-up with patient again after meeting.  Updated him on needing ambulance for transfer home when appropriate.  Patient will need 24/7 caregivers at home.  Also discussed patient may need a Foley moving forward to assist with his comfort. Spent time answering patient's  questions and providing emotional support.  Thanked patient and his family for allowing me to meet with them today.  Had been updating TOC during conversation as well to provide updates to family and assist in care coordination.   Review of Systems Pain and sleeping improved Objective:   Vital Signs:  BP (!) 127/58 (BP  Location: Left Arm)   Pulse 78   Temp 98.4 F (36.9 C) (Axillary)   Resp 14   Ht '5\' 5"'$  (1.651 m)   Wt 66.2 kg   SpO2 97%   BMI 24.30 kg/m   Physical Exam: General: NAD, alert, chronically ill appearing, frail, pleasant  Eyes:no drainage notes HENT: moist mucous membranes Cardiovascular: RRR, no edema in LE b/l Respiratory: no increased work of breathing noted, not in respiratory distress Abdomen: not distended Extremities: muscle wasting present in all extremities   Skin: no rashes or lesions on visible skin Neuro: A&Ox4, following commands easily Psych: appropriately answers all questions  Imaging:  I personally reviewed recent imaging.   Assessment & Plan:   Assessment: Patient is a 89 year old male with a past medical history of hormone refractory prostate cancer with metastatic disease to bone status post radiation (with complications), chronic ITP, A-fib, and depression who was admitted on 04/28/2022 for management of nausea, diarrhea, and generalized weakness.  Patient was sent to hospital as a direct admit from his oncologist.  Since admission, patient has received medical management for nausea, vomiting, poor oral intake, and chronic diarrhea.  Despite aggressive medical management, medical status has continued to deteriorate.  Palliative medicine team consulted to assist with complex medical decision making.   Recommendations/Plan: # Complex medical decision making/goals of care:     -Another extensive of conversation with patient, patient's wife, and patient's children as documented in detail above in HPI. [place] liaison present during conversation as well to assist in describing hospice services.  Will continue with comfort focused care at this time. Planning for discharge home with [facility] hospice once 24/7 care assistance established and equipment set up. TOC assist with referrals for hiring caregivers.                -  Code Status: DNR    # Symptom management                 -Pain/Dyspnea                               -Continue dilaudid po solution 0.'25mg'$  q4hrs prn. Patient did not want morphine or oxycodone. Consider bowel regimen once diarrhea resolved with use of opioids.                                -Continue gabapentin '100mg'$  qhs.                               -Continue daily prednisone                  Anxiety                               -Continue ativan solution po 0.'25mg'$  q4hrs prn                  Urinary Rentention                               -  Continue Proscar and Flomax. Again discussed may need foley when appropraite for comfort and dignity at the end of life.                  # Psycho-social/Spiritual Support:  - Support System: wife (second marriage), daughters   # Discharge Planning:  Home with [place] and 24/7 care assistance  Discussed with: patient, wife, daughters  Thank you for allowing the palliative care team to participate in the care DASTAN SCREWS.  Chelsea Aus, DO Palliative Care Provider PMT # [phone]  If patient remains symptomatic despite maximum doses, please call PMT at [phone] between 0700 and 1900. Outside of these hours, please call attending, as PMT does not have night coverage.  *Please note that this is a verbal dictation therefore any spelling or grammatical errors are due to the "[place] [employer]" system interpretation.   This provider spent a total of 65 minutes providing patient's care.  Includes review of EMR, discussing care with other staff members involved in patient's medical care, obtaining relevant history and information from patient and/or patient's family, and personal review of imaging and lab work. Greater than 50% of the time was spent counseling and coordinating care related to the above assessment and plan.

## 2022-05-05 NOTE — Progress Notes (Signed)
PROGRESS NOTE    [name]  [id number] DOB: 1923-07-15 DOA: 04/28/2022 PCP: [name], MD    Brief Narrative:  89 y.o. male with medical history significant of hormone refractory prostate cancer-completed a course of palliative radiation to his lumbar spine on 2/13. He subsequently started developing nausea, diarrhea and generalized weakness.  He was sent to the hospital as a direct admit by his oncologist. After completing palliative radiation to his lumbar spine, he started to develop nausea, vomiting, diarrhea.  Initially, thought it was a stomach bug, but has not been able to keep any nutrition down for about 10 days.  Admitted with dehydration and poor oral intake. Remains in very poor clinical status with no appropriate improvement. 3/3, shared decision making.  Patient served with comfort care and hospice level of care.  Assessment & Plan:   Multifactorial nausea and vomiting, poor oral intake and chronic diarrhea.   Metastatic prostate cancer exhausted treatment  Chronic ITP  Paroxysmal A-fib  Multiple electrolyte abnormalities  Failure to thrive  End-of-life care    No adequate improvement despite maximum medical therapy.  Patient continues to have poor appetite, discomfort and profound debility. Comfort care and hospice. All symptom control medications are available.  Currently comfortable on gabapentin, scheduled Zofran, Zoloft and occasional dose of oral hydromorphone. Family meeting today to sort out logistics at home including personal care before discharging home with home hospice. Unlimited visitor policy. RN to pronounce death if happens in the hospital.    DVT prophylaxis: Comfort care   Code Status: Comfort care. Family Communication: 2 daughters at the bedside.   Disposition Plan: Status is: Inpatient Remains inpatient appropriate because: Home hospice arrangements pending.     Consultants:  Oncology Palliative care  Procedures:   None  Antimicrobials:  None   Subjective:  Patient seen and examined.  He had received a dose of Dilaudid at 2 AM in the morning.  He was sleepy but occasionally moving both of his hands up.  He did not respond to gentle voice so I let him sleep. 2 daughters at the bedside.  There is a family meeting scheduled with social worker and hospice today.  Objective: Vitals:   05/03/22 1339 05/03/22 2102 05/04/22 0636 05/05/22 0633  BP: (!) 123/56 122/72 (!) 128/56 (!) 127/58  Pulse: 66 74 72 78  Resp: '18 15 18 14  '$ Temp:  97.9 F (36.6 C) 98.4 F (36.9 C) 98.4 F (36.9 C)  TempSrc:  Oral Oral Axillary  SpO2: 93% 97% 98% 97%  Weight:      Height:        Intake/Output Summary (Last 24 hours) at 05/05/2022 1058 Last data filed at 05/04/2022 1402 Gross per 24 hour  Intake 120 ml  Output --  Net 120 ml    Filed Weights   04/28/22 1757  Weight: 66.2 kg    Examination:  General exam: Frail and debilitated.  Sleepy today.  Tremulous. Respiratory system: No added sounds. Cardiovascular system: S1 & S2 heard, RRR.  2+ bilateral pedal edema. Gastrointestinal system: Soft.  Nontender.      Data Reviewed: I have personally reviewed following labs and imaging studies  CBC: Recent Labs  Lab 04/29/22 0628 04/30/22 0627 05/04/22 0730  WBC 10.7* 9.4 18.1*  NEUTROABS  --  5.2 13.6*  HGB 7.7* 8.1* 8.2*  HCT 25.7* 27.2* 26.7*  MCV 81.3 83.7 82.7  PLT 39* 38* 27*    Basic Metabolic Panel: Recent Labs  Lab 04/29/22  [id number] 04/30/22 0627 05/04/22 0730  NA 138 137 134*  K 3.4* 3.8 3.2*  CL 112* 110 108  CO2 17* 17* 20*  GLUCOSE 80 110* 93  BUN 23 26* 14  CREATININE 1.00 1.00 1.07  CALCIUM 7.2* 7.6* 6.9*  MG  --  1.7 1.4*  PHOS  --   --  2.5    GFR: Estimated Creatinine Clearance: 35.1 mL/min (by C-G formula based on SCr of 1.07 mg/dL). Liver Function Tests: Recent Labs  Lab 04/29/22 0628 05/04/22 0730  AST 19 26  ALT 9 16  ALKPHOS 913* 852*  BILITOT 1.0 0.6   PROT 4.3* 4.2*  ALBUMIN 2.1* 1.9*    No results for input(s): "LIPASE", "AMYLASE" in the last 168 hours. No results for input(s): "AMMONIA" in the last 168 hours. Coagulation Profile: No results for input(s): "INR", "PROTIME" in the last 168 hours. Cardiac Enzymes: No results for input(s): "CKTOTAL", "CKMB", "CKMBINDEX", "TROPONINI" in the last 168 hours. BNP (last 3 results) No results for input(s): "PROBNP" in the last 8760 hours. HbA1C: No results for input(s): "HGBA1C" in the last 72 hours. CBG: No results for input(s): "GLUCAP" in the last 168 hours. Lipid Profile: No results for input(s): "CHOL", "HDL", "LDLCALC", "TRIG", "CHOLHDL", "LDLDIRECT" in the last 72 hours. Thyroid Function Tests: No results for input(s): "TSH", "T4TOTAL", "FREET4", "T3FREE", "THYROIDAB" in the last 72 hours. Anemia Panel: No results for input(s): "VITAMINB12", "FOLATE", "FERRITIN", "TIBC", "IRON", "RETICCTPCT" in the last 72 hours. Sepsis Labs: No results for input(s): "PROCALCITON", "LATICACIDVEN" in the last 168 hours.  Recent Results (from the past 240 hour(s))  C difficile quick screen w PCR reflex     Status: None   Collection Time: 04/28/22 11:49 AM   Specimen: STOOL  Result Value Ref Range Status   C Diff antigen NEGATIVE NEGATIVE Final   C Diff toxin NEGATIVE NEGATIVE Final   C Diff interpretation No C. difficile detected.  Final    Comment: Performed at [facility], [place] [address], [place], [place] [address]  GI pathogen panel by PCR, stool     Status: None   Collection Time: 04/28/22 11:49 AM   Specimen: Stool  Result Value Ref Range Status   Plesiomonas shigelloides NOT DETECTED NOT DETECTED Final   Yersinia enterocolitica NOT DETECTED NOT DETECTED Final   Vibrio NOT DETECTED NOT DETECTED Final   Enteropathogenic E coli NOT DETECTED NOT DETECTED Final   E coli (ETEC) LT/ST NOT DETECTED NOT DETECTED Final   E coli [id number] by PCR Not applicable NOT DETECTED Final    Cryptosporidium by PCR NOT DETECTED NOT DETECTED Final   Entamoeba histolytica NOT DETECTED NOT DETECTED Final   Adenovirus F 40/41 NOT DETECTED NOT DETECTED Final   Norovirus GI/GII NOT DETECTED NOT DETECTED Final   Sapovirus NOT DETECTED NOT DETECTED Final    Comment: (NOTE) Performed At: [facility] [place], [place] [id number] Rush Farmer MD [id number]    Vibrio cholerae NOT DETECTED NOT DETECTED Final   Campylobacter by PCR NOT DETECTED NOT DETECTED Final   Salmonella by PCR NOT DETECTED NOT DETECTED Final   E coli (STEC) NOT DETECTED NOT DETECTED Final   Enteroaggregative E coli NOT DETECTED NOT DETECTED Final   Shigella by PCR NOT DETECTED NOT DETECTED Final   Cyclospora cayetanensis NOT DETECTED NOT DETECTED Final   Astrovirus NOT DETECTED NOT DETECTED Final   G lamblia by PCR NOT DETECTED NOT DETECTED Final   Rotavirus A by PCR  NOT DETECTED NOT DETECTED Final         Radiology Studies: No results found.      Scheduled Meds:  finasteride  5 mg Oral Daily   gabapentin  100 mg Oral QHS   latanoprost  1 drop Both Eyes QHS   ondansetron  4 mg Oral TID AC   pantoprazole  40 mg Oral Daily   predniSONE  5 mg Oral Q breakfast   sertraline  50 mg Oral Daily   tamsulosin  0.8 mg Oral QPC supper   timolol  1 drop Right Eye Daily   Continuous Infusions:     LOS: 7 days    Time spent: 35 minutes    Barb Merino, MD [facility] Pager [phone]

## 2022-05-05 NOTE — TOC Progression Note (Addendum)
Transition of Care [facility]) - Progression Note    Patient Details  Name: NORMAL GRESS MRN: [id number] Date of Birth: Nov 17, 1923  Transition of Care [facility]) CM/SW [place], RN Phone Number:[phone]  05/05/2022, 10:39 AM  Clinical Narrative:    [facility] acknowledges consult for home with [facility]. [facility] has already placed a note and will meet with family today to determine if family wants hospice to follow. TOC following  1130 CM received message that family needs assistance with 24/7 caregiver support. CM has made referral to [place] to assist family with arranging caregivers. Referral has been accepted by Annie Main. Agency will reach out to family.   53 CM received message that family wants to use Home instead for caregiver support. Message has been sent to [facility] with [place].  1624 CM received message to call wife. CM called [name] and wife states that she would like to call [place] for Assistance with Caregivers. CM gave wife contact information and will follow up with Annie Main at [place].     Expected Discharge Plan: Home w Hospice Care Barriers to Discharge: Continued Medical Work up  Expected Discharge Plan and Services In-house Referral: NA Discharge Planning Services: CM Consult   Living arrangements for the past 2 months: Single Family Home                 DME Arranged: N/A                     Social Determinants of Health (SDOH) Interventions SDOH Screenings   Food Insecurity: No Food Insecurity (04/28/2022)  Housing: Low Risk  (04/28/2022)  Transportation Needs: No Transportation Needs (04/28/2022)  Utilities: Not At Risk (04/28/2022)  Depression (PHQ2-9): Low Risk  (03/21/2020)  Tobacco Use: Medium Risk (04/28/2022)    Readmission Risk Interventions    04/30/2022    2:51 PM 09/09/2019   11:06 AM  Readmission Risk Prevention Plan  Post Dischage Appt  Complete  Medication Screening  Complete   Transportation Screening Complete Complete  PCP or Specialist Appt within 5-7 Days Complete   Home Care Screening Complete   Medication Review (RN CM) Complete

## 2022-05-05 NOTE — Progress Notes (Signed)
Manufacturing engineer [facility]) Hospital Liaison Note  Extensive meeting held on this date with PMT provider/Dr. Vinetta Bergamo, daughter (s), spouse and family friend/Carol. Extensive education provided to all present regarding hospice services, visit frequency and Medicare Hospice Benefit coverage. All present are in agreement to proceed with hospice services and denied any additional needs at this time.   Family have requested assistance from [facility] team to coordinate 24/7 care support in the home. Additionally, family has requested that DME below to be delivered 3/5.   DME needs discussed. Patient has the following equipment in the home N/a Patient requests the following equipment for delivery: Hospital Bed [facility]  Address verified and is correct in the chart. [name]  is the family member to contact to arrange time of equipment delivery.    Please send signed and completed DNR home with patient/family. Please provide prescriptions at discharge as needed to ensure ongoing symptom management.    [facility] information and contact numbers given to family & above information shared with TOC.   Please call with any questions/concerns.    Thank you for the opportunity to participate in this patient's care.   Phillis Haggis, MSW [facility] Liaison  [phone]

## 2022-05-05 NOTE — Plan of Care (Signed)
Patient AOX4, VSS throughout shift. All meds given on time as ordered. Fluids on-going. Pt standby assist to bedside commode. Pt voided in bedside commode. Diminished lungs, IS encouraged. POC maintained, will continue to monitor.   Problem: Education: Goal: Knowledge of General Education information will improve Description: Including pain rating scale, medication(s)/side effects and non-pharmacologic comfort measures Outcome: Progressing   Problem: Health Behavior/Discharge Planning: Goal: Ability to manage health-related needs will improve Outcome: Progressing   Problem: Clinical Measurements: Goal: Ability to maintain clinical measurements within normal limits will improve Outcome: Progressing Goal: Will remain free from infection Outcome: Progressing Goal: Diagnostic test results will improve Outcome: Progressing Goal: Respiratory complications will improve Outcome: Progressing Goal: Cardiovascular complication will be avoided Outcome: Progressing   Problem: Activity: Goal: Risk for activity intolerance will decrease Outcome: Progressing   Problem: Nutrition: Goal: Adequate nutrition will be maintained Outcome: Progressing   Problem: Coping: Goal: Level of anxiety will decrease Outcome: Progressing   Problem: Elimination: Goal: Will not experience complications related to bowel motility Outcome: Progressing Goal: Will not experience complications related to urinary retention Outcome: Progressing   Problem: Pain Managment: Goal: General experience of comfort will improve Outcome: Progressing   Problem: Safety: Goal: Ability to remain free from injury will improve Outcome: Progressing   Problem: Skin Integrity: Goal: Risk for impaired skin integrity will decrease Outcome: Progressing

## 2022-05-06 ENCOUNTER — Ambulatory Visit: Payer: Self-pay

## 2022-05-06 ENCOUNTER — Ambulatory Visit: Payer: Medicare Other | Admitting: Physician Assistant

## 2022-05-06 DIAGNOSIS — R627 Adult failure to thrive: Secondary | ICD-10-CM | POA: Diagnosis not present

## 2022-05-06 MED ORDER — BISACODYL 10 MG RE SUPP
10.0000 mg | Freq: Every day | RECTAL | Status: DC | PRN
  Filled 2022-05-06: qty 1

## 2022-05-06 MED ORDER — SENNOSIDES-DOCUSATE SODIUM 8.6-50 MG PO TABS
1.0000 | ORAL_TABLET | Freq: Two times a day (BID) | ORAL | Status: DC
  Administered 2022-05-06 – 2022-05-09 (×5): 1 via ORAL
  Filled 2022-05-06 (×5): qty 1

## 2022-05-06 NOTE — Progress Notes (Signed)
Manufacturing engineer [facility]) Hospital Liaison Note   [facility] continues to follow patient though hospitalization as family finalize DC plan (I.e. 24/7 caregiver support).   Please send signed and completed DNR home with patient/family. Please provide prescriptions at discharge as needed to ensure ongoing symptom management.    [facility] information and contact numbers given to family & above information shared with TOC.   Please call with any questions/concerns.    Thank you for the opportunity to participate in this patient's care.   Phillis Haggis, MSW [facility] Liaison  [phone]

## 2022-05-06 NOTE — Progress Notes (Signed)
Nutrition Brief Note  Chart reviewed. Pt now transitioned to comfort care.  No further nutrition interventions planned at this time.    Clayton Bibles, MS, RD, LDN Inpatient Clinical Dietitian Contact information available via Amion

## 2022-05-06 NOTE — Consult Note (Signed)
   [facility] Inpatient Consult   05/06/2022  GOKUL ARRANT 01/05/1924 [id number]  [place] Organization [ACO] Patient: Medicare [place] [facility] Liaison remote coverage review for patient admitted to [facility]     Chart reviewed or active member with [facility] RNCM prior to admission and for length of stay review.  Patient's electronic medical record reveals the patient is currently transitioning to Hospice care and currently with noted comfort measures.  Patient currently for home with hospice noted with caregiver support.  Plan: Will update [facility] RNCM of patient's now for hospice care for care coordination.  Patient' s [facility] RNCM has follow up appointment noted for next week [05/13/22].  Patient is to have full case management services through Hospice and needs will be met at the hospice level of care. Will sign off at transition from hospital.  For questions,   Natividad Brood, RN BSN [place]  [phone] business mobile phone Toll free office [phone]  *[place]  [phone] Fax number: [phone] [email or web address]

## 2022-05-06 NOTE — Patient Outreach (Signed)
  Care Coordination   Case Collaboration  Visit Note   05/06/2022 Name: [name] MRN: [id number] DOB: 09-29-1923  [name] is a 89 y.o. year old male who sees [name], MD for primary care. I  collaborated with [facility] hospital liaison Natividad Brood RN today regarding a patient status update.   What matters to the patients health and wellness today?  Patient is currently inpatient at [facility].     Goals Addressed             This Visit's Progress    RN Care Coordination Activities: further follow up needed       Care Coordination Interventions: Noted patient is currently inpatient at [facility]; admit date: 04/28/22; dx: Prostate cancer metastatic to bone  Received the following patient status update per [place];  Patient is transitioning to comfort measures at [facility] and likely home with home hospice.  Seeking caregiver with family for 24/7 care noted by inpatient [facility] team.         SDOH assessments and interventions completed:  No     Care Coordination Interventions:  Yes, provided   Follow up plan: Follow up call scheduled for 05/13/22 '@11'$ :30 AM     Encounter Outcome:  Pt. Visit Completed

## 2022-05-06 NOTE — Progress Notes (Signed)
Chaplain provided support to [name] and his wife.  [name] was grateful for today and for the blessings that he has had in his life.  He had one particular theological question which we explored.  He was appreciative that he was able to share about this and is also looking forward to having his priest come to visit later today.  Chaplain provided emotional and spiritual support to both [name] and his wife.  [address] [place] Pager, [phone]

## 2022-05-06 NOTE — Progress Notes (Signed)
PROGRESS NOTE    [name]  [name] DOB: 12-07-1923 DOA: 04/28/2022 PCP: [name], MD    Brief Narrative:  89 y.o. male with medical history significant of hormone refractory prostate cancer-completed a course of palliative radiation to his lumbar spine on 2/13. He subsequently started developing nausea, diarrhea and generalized weakness.  He was sent to the hospital as a direct admit by his oncologist. After completing palliative radiation to his lumbar spine, he started to develop nausea, vomiting, diarrhea.  Initially, thought it was a stomach bug, but has not been able to keep any nutrition down for about 10 days.  Admitted with dehydration and poor oral intake. Remains in very poor clinical status with no appropriate improvement. 3/3, shared decision making.  Patient served with comfort care and hospice level of care. Waiting for DME and 24/7 care at home before discharge.  Assessment & Plan:   Multifactorial nausea and vomiting, poor oral intake and chronic diarrhea.   Metastatic prostate cancer exhausted treatment  Chronic ITP  Paroxysmal A-fib  Multiple electrolyte abnormalities  Failure to thrive  End-of-life care    No adequate improvement despite maximum medical therapy.  Patient continued to have poor appetite, discomfort and profound debility. 3/3, Comfort care and hospice initiated. Symptoms managed with Zofran as a scheduled doses, prednisone, sertraline.  He is using as needed Dilaudid. Gabapentin made him very groggy, will discontinue. Family is arranging 24/7 care agencies before discharge. Unlimited visitor policy. RN to pronounce death if happens in the hospital. Continue supportive care.    DVT prophylaxis: Comfort care   Code Status: Comfort care. Family Communication: 2 daughters at the bedside.  Separately with patient's wife. Disposition Plan: Status is: Inpatient Remains inpatient appropriate because: Home hospice arrangements  pending.  24/7 care needed.     Consultants:  Oncology Palliative care  Procedures:  None  Antimicrobials:  None   Subjective:  Patient seen and examined.  Patient was very groggy on waking up but recognizes this provider.  Patient tells me that he wants to know what is all going on and does not want to be sedated.  We discussed with family and discontinued gabapentin.  Currently denies any complaints but just felt tired and fatigued.  Apparently he ate a good meal last night.  Patient has very weak family with different family dynamics, his 2 daughters are very involved but his wife has separate involvement. Multiple people visiting him.  Objective: Vitals:   05/05/22 0633 05/05/22 1417 05/05/22 2221 05/06/22 0627  BP: (!) 127/58 122/60  120/62  Pulse: 78 87  95  Resp: '14 20  18  '$ Temp: 98.4 F (36.9 C) 98.2 F (36.8 C) 98.2 F (36.8 C) (!) 97.4 F (36.3 C)  TempSrc: Axillary Oral Axillary Oral  SpO2: 97% 93%  94%  Weight:      Height:        Intake/Output Summary (Last 24 hours) at 05/06/2022 1401 Last data filed at 05/06/2022 1206 Gross per 24 hour  Intake 490 ml  Output --  Net 490 ml    Filed Weights   04/28/22 1757  Weight: 66.2 kg    Examination:  General exam: Frail and debilitated.  Tired.  Looks very lethargic to keep up conversation. Respiratory system: No added sounds. Cardiovascular system: S1 & S2 heard, RRR.  2+ bilateral pedal edema. Gastrointestinal system: Soft.  Nontender.      Data Reviewed: I have personally reviewed following labs and imaging studies  CBC:  Recent Labs  Lab 04/30/22 0627 05/04/22 0730  WBC 9.4 18.1*  NEUTROABS 5.2 13.6*  HGB 8.1* 8.2*  HCT 27.2* 26.7*  MCV 83.7 82.7  PLT 38* 27*    Basic Metabolic Panel: Recent Labs  Lab 04/30/22 0627 05/04/22 0730  NA 137 134*  K 3.8 3.2*  CL 110 108  CO2 17* 20*  GLUCOSE 110* 93  BUN 26* 14  CREATININE 1.00 1.07  CALCIUM 7.6* 6.9*  MG 1.7 1.4*  PHOS  --  2.5     GFR: Estimated Creatinine Clearance: 35.1 mL/min (by C-G formula based on SCr of 1.07 mg/dL). Liver Function Tests: Recent Labs  Lab 05/04/22 0730  AST 26  ALT 16  ALKPHOS 852*  BILITOT 0.6  PROT 4.2*  ALBUMIN 1.9*    No results for input(s): "LIPASE", "AMYLASE" in the last 168 hours. No results for input(s): "AMMONIA" in the last 168 hours. Coagulation Profile: No results for input(s): "INR", "PROTIME" in the last 168 hours. Cardiac Enzymes: No results for input(s): "CKTOTAL", "CKMB", "CKMBINDEX", "TROPONINI" in the last 168 hours. BNP (last 3 results) No results for input(s): "PROBNP" in the last 8760 hours. HbA1C: No results for input(s): "HGBA1C" in the last 72 hours. CBG: No results for input(s): "GLUCAP" in the last 168 hours. Lipid Profile: No results for input(s): "CHOL", "HDL", "LDLCALC", "TRIG", "CHOLHDL", "LDLDIRECT" in the last 72 hours. Thyroid Function Tests: No results for input(s): "TSH", "T4TOTAL", "FREET4", "T3FREE", "THYROIDAB" in the last 72 hours. Anemia Panel: No results for input(s): "VITAMINB12", "FOLATE", "FERRITIN", "TIBC", "IRON", "RETICCTPCT" in the last 72 hours. Sepsis Labs: No results for input(s): "PROCALCITON", "LATICACIDVEN" in the last 168 hours.  Recent Results (from the past 240 hour(s))  C difficile quick screen w PCR reflex     Status: None   Collection Time: 04/28/22 11:49 AM   Specimen: STOOL  Result Value Ref Range Status   C Diff antigen NEGATIVE NEGATIVE Final   C Diff toxin NEGATIVE NEGATIVE Final   C Diff interpretation No C. difficile detected.  Final    Comment: Performed at [facility], [place] [address], [place], [place] [address]  GI pathogen panel by PCR, stool     Status: None   Collection Time: 04/28/22 11:49 AM   Specimen: Stool  Result Value Ref Range Status   Plesiomonas shigelloides NOT DETECTED NOT DETECTED Final   Yersinia enterocolitica NOT DETECTED NOT DETECTED Final   Vibrio NOT DETECTED NOT  DETECTED Final   Enteropathogenic E coli NOT DETECTED NOT DETECTED Final   E coli (ETEC) LT/ST NOT DETECTED NOT DETECTED Final   E coli [id number] by PCR Not applicable NOT DETECTED Final   Cryptosporidium by PCR NOT DETECTED NOT DETECTED Final   Entamoeba histolytica NOT DETECTED NOT DETECTED Final   Adenovirus F 40/41 NOT DETECTED NOT DETECTED Final   Norovirus GI/GII NOT DETECTED NOT DETECTED Final   Sapovirus NOT DETECTED NOT DETECTED Final    Comment: (NOTE) Performed At: [facility] [place], [place] [id number] Rush Farmer MD [id number]    Vibrio cholerae NOT DETECTED NOT DETECTED Final   Campylobacter by PCR NOT DETECTED NOT DETECTED Final   Salmonella by PCR NOT DETECTED NOT DETECTED Final   E coli (STEC) NOT DETECTED NOT DETECTED Final   Enteroaggregative E coli NOT DETECTED NOT DETECTED Final   Shigella by PCR NOT DETECTED NOT DETECTED Final   Cyclospora cayetanensis NOT DETECTED NOT DETECTED Final   Astrovirus NOT DETECTED NOT  DETECTED Final   G lamblia by PCR NOT DETECTED NOT DETECTED Final   Rotavirus A by PCR NOT DETECTED NOT DETECTED Final         Radiology Studies: No results found.      Scheduled Meds:  finasteride  5 mg Oral Daily   latanoprost  1 drop Both Eyes QHS   ondansetron  4 mg Oral TID AC   pantoprazole  40 mg Oral Daily   predniSONE  5 mg Oral Q breakfast   senna-docusate  1 tablet Oral BID   sertraline  50 mg Oral Daily   tamsulosin  0.8 mg Oral QPC supper   timolol  1 drop Right Eye Daily   Continuous Infusions:     LOS: 8 days    Time spent: 35 minutes    Barb Merino, MD [facility] Pager [phone]

## 2022-05-06 NOTE — Plan of Care (Signed)
Patient AOX4, VSS throughout shift. All meds given on time as ordered. Pt c/o pain relieved by PRN pain med. Pt standby assist to bedside commode. Pt voided in bedside commode. Diminished lungs, IS encouraged. POC maintained, will continue to monitor.   Problem: Education: Goal: Knowledge of General Education information will improve Description: Including pain rating scale, medication(s)/side effects and non-pharmacologic comfort measures Outcome: Progressing   Problem: Health Behavior/Discharge Planning: Goal: Ability to manage health-related needs will improve Outcome: Progressing   Problem: Clinical Measurements: Goal: Ability to maintain clinical measurements within normal limits will improve Outcome: Progressing Goal: Will remain free from infection Outcome: Progressing Goal: Diagnostic test results will improve Outcome: Progressing Goal: Respiratory complications will improve Outcome: Progressing Goal: Cardiovascular complication will be avoided Outcome: Progressing   Problem: Activity: Goal: Risk for activity intolerance will decrease Outcome: Progressing   Problem: Nutrition: Goal: Adequate nutrition will be maintained Outcome: Progressing   Problem: Coping: Goal: Level of anxiety will decrease Outcome: Progressing   Problem: Elimination: Goal: Will not experience complications related to bowel motility Outcome: Progressing Goal: Will not experience complications related to urinary retention Outcome: Progressing   Problem: Pain Managment: Goal: General experience of comfort will improve Outcome: Progressing   Problem: Safety: Goal: Ability to remain free from injury will improve Outcome: Progressing   Problem: Skin Integrity: Goal: Risk for impaired skin integrity will decrease Outcome: Progressing

## 2022-05-07 DIAGNOSIS — Z515 Encounter for palliative care: Secondary | ICD-10-CM

## 2022-05-07 DIAGNOSIS — R627 Adult failure to thrive: Secondary | ICD-10-CM | POA: Diagnosis not present

## 2022-05-07 NOTE — Progress Notes (Addendum)
Manufacturing engineer [facility]) Hospital Liaison Note   [facility] continues to follow patient though hospitalization as family finalize DC plan (I.e. 24/7 caregiver support). As of 3/6, spouse/[name] reports that she has a meeting tody with a 24/7 care agency in hopes that they are able to provide 24/7 to patient. [name] relayed the same to [facility]. Per [name] report, goal is for DC Friday once caregivers in place.   Please send signed and completed DNR home with patient/family. Please provide prescriptions at discharge as needed to ensure ongoing symptom management.    [facility] information and contact numbers given to family & above information shared with TOC.  Addendum: DME is in place and [facility] is prepared for patient DC once medically clear.   Please call with any questions/concerns.    Thank you for the opportunity to participate in this patient's care.   Phillis Haggis, MSW [facility] Liaison  [phone]

## 2022-05-07 NOTE — Progress Notes (Signed)
IP PROGRESS NOTE  Subjective:   [name] has remained in the hospital with diarrhea and failure to thrive.  He has consulted with the palliative care team and hospice.  He is scheduled to be discharged home with hospice care and caretakers.  His wife is at the bedside this morning. [name] reports improvement in diarrhea.  He is eating.  He complains of leg pain.  Objective: Vital signs in last 24 hours: Blood pressure (!) 101/56, pulse 83, temperature 98 F (36.7 C), temperature source Oral, resp. rate 18, height '5\' 5"'$  (1.651 m), weight 146 lb (66.2 kg), SpO2 93 %.  Intake/Output from previous day: 03/05 0701 - 03/06 0700 In: 480 [P.O.:480] Out: -   Physical Exam:  HEENT: No thrush Lungs: Clear anteriorly Cardiac: Regular rate and rhythm Skin: Scattered ecchymoses Extremities: Anasarca    Medications: I have reviewed the patient's current medications.  Assessment/Plan: Thrombocytopenia Bone marrow biopsy 07/08/2016-no evidence of B-cell lymphoma, 40% cellular bone marrow with trilineage hematopoiesis, megakaryocytes present with normal morphology, normal cytogenetics, negative for BRAF mutation Flow cytometry 01/05/2009-no monoclonal B-cell or phenotypically abnormal T-cell population Prednisone starting 06/29/2019 Nplate 07/04/2019, [id number], Nplate 07/18/2019 Prednisone 20 mg daily beginning 07/25/2019 Platelets 11,000 on 08/15/2019, prednisone increased to 40 mg daily, Nplate resume [id number]  Prednisone decreased to 30 mg daily 08/25/2019, Nplate continued Prednisone continued at 30 mg daily 09/13/2019, weekly Nplate continued Prednisone taper to 20 mg daily 09/23/2019, weekly Nplate continued Prednisone taper to 10 mg daily 09/30/2019, weekly Nplate continued Prednisone taper to 5 mg daily 10/21/2019, weekly Nplate continued Prednisone taper to 5 mg every other day for 5 doses then stop 11/11/2019, weekly Nplate continued Nplate last given D34-534 Promacta cost  prohibitive Nplate beginning X33443, held 08/27/2020 and 09/03/2020 due to patient vacation Nplate resumed X33443 Nplate held [id number] secondary to an elevated platelet count Nplate resumed with dose reduction to 1 mcg/kg 10/09/2020 Nplate dose decreased to 0.5 mcg/kg 10/29/2020 Nplate dose increased to 1 mcg/kg 11/07/2020 Hairy cell leukemia 1982, status post splenectomy Coronary artery disease Aortic stenosis Liposarcoma at the right chest wall resected in 2013 Macular degeneration Hearing loss Basal cell carcinoma left cheek 05/24/2019 Left upper lobe nodule, faint FDG activity on PET at [facility] 05/31/2013 Left lung mass has been present since 2007, negative bronchoscopy December 2007 Status post TAVR procedure 09/06/2019 Admission with Pseudomonas urosepsis 09/16/2019 Acute left MCA distribution CVA 09/16/2019 Covid January 2022 Mild anemia, 1 of 3 stool Hemoccult cards positive for occult blood 10/14/2020, ferritin low 06/29/2020 Virtual colonoscopy 06/25/2021 -5 mm polypoid defect in the ascending colon 15.  Prostate cancer-sclerotic bone lesions/prostatic hypertrophy on CT 06/25/2021; Lupron every 3 months beginning 08/02/2021, bicalutamide 08/02/2021 for 2 weeks Abiraterone/prednisone 11/05/2021 Lupron every 3 months beginning 08/02/2021 12/16/2021 PSA higher (457) 12/26/2021 testosterone less than 3 Guardant360 01/13/2022-tumor mutation burden 8.61, MSI high not detected, NRAS G13D,PALB2 VUS Lupron every 3 months continued, Casodex 50 mg daily 02/10/2022 Radiation to lumbar spine 04/03/2022-04/16/2022 16.  Pneumonia 09/30/2021 17.  Admission 04/28/2022 with nausea/vomiting, diarrhea, and dehydration CT abdomen/pelvis 04/29/2022-no acute finding, diffuse bone metastases, left lung mass (chronic)     [name] has hormone refractory prostate cancer.  He has chronic thrombocytopenia felt to be due to ITP and prostate cancer.  He was admitted with diarrhea and nausea .  A  C. difficile toxin was and GI  pathogen panel were negative.  His GI symptoms have improved, but not completely resolved.  [name] has decided to enroll in home hospice  care.  He indicated he does not wish to live in a debilitated state.  He appears comfortable this morning.  I will plan to follow him with the home hospice team.  I am available to assist in his care as needed.  He is scheduled for outpatient follow-up at the Cancer center.  His wife will contact [place] if he cannot travel to an appointment. He has persistent anemia/thrombocytopenia.  He will not be a candidate to continue Nplate while enrolled in hospice.   LOS: 9 days   Betsy Coder, MD   05/07/2022, 3:14 PM

## 2022-05-07 NOTE — TOC Progression Note (Signed)
Transition of Care [facility]) - Progression Note    Patient Details  Name: [name] MRN: [id number] Date of Birth: 09-21-1923  Transition of Care [facility]) CM/SW [place], RN Phone [phone]  05/07/2022, 11:11 AM  Clinical Narrative:    CM received message from MD requesting to know if patient is able to go home today with Hospice. Per daughter at bedside patients wife is currently meeting with agency now to set up 24/7 care . MD has been updated. CM to follow up with wife to confirm.    Expected Discharge Plan: Home w Hospice Care Barriers to Discharge: Continued Medical Work up  Expected Discharge Plan and Services In-house Referral: NA Discharge Planning Services: CM Consult   Living arrangements for the past 2 months: Single Family Home                 DME Arranged: N/A                     Social Determinants of Health (SDOH) Interventions SDOH Screenings   Food Insecurity: No Food Insecurity (04/28/2022)  Housing: Low Risk  (04/28/2022)  Transportation Needs: No Transportation Needs (04/28/2022)  Utilities: Not At Risk (04/28/2022)  Depression (PHQ2-9): Low Risk  (03/21/2020)  Tobacco Use: Medium Risk (04/28/2022)    Readmission Risk Interventions    04/30/2022    2:51 PM 09/09/2019   11:06 AM  Readmission Risk Prevention Plan  Post Dischage Appt  Complete  Medication Screening  Complete  Transportation Screening Complete Complete  PCP or Specialist Appt within 5-7 Days Complete   Home Care Screening Complete   Medication Review (RN CM) Complete

## 2022-05-07 NOTE — Progress Notes (Signed)
PROGRESS NOTE    [name]  [id number] DOB: 1923-07-21 DOA: 04/28/2022 PCP: [name], MD   Brief Narrative:  89 y.o. male with medical history significant of hormone refractory prostate cancer-completed a course of palliative radiation to his lumbar spine on 04/15/2022, chronic ITP, paroxysmal A-fib presented with nausea, diarrhea and generalized weakness and was admitted with dehydration and started on IV fluids.  Because of overall poor prognosis, palliative care was consulted.  Patient has decided to proceed with comfort measures and plan is to discharge home with hospice once 24/7 care gets arranged.  TOC following.  Assessment & Plan:   Comfort care measures only/end-of-life care Failure to thrive Paroxysmal A-fib Chronic ITP Multifactorial nausea and vomiting, poor oral intake and chronic diarrhea Metastatic prostate cancer Multiple electrolyte abnormalities Leukocytosis Thrombocytopenia Anemia of chronic disease Hypoalbuminemia  Plan -Patient has decided to proceed with comfort measures and plan is to discharge home with hospice once 24/7 care gets arranged.  TOC following.  DVT prophylaxis: None for comfort measures Code Status: DNR Family Communication: Wife at bedside Disposition Plan: Status is: Inpatient Remains inpatient appropriate because: Of need for home hospice arrangements with 24/7 care  Consultants: Palliative care/oncology  Procedures: None  Antimicrobials: None   Subjective: Patient seen and examined at bedside.  Hoping to go home soon.  No overnight fever, seizures, vomiting reported.  Objective: Vitals:   05/05/22 2221 05/06/22 0627 05/06/22 1433 05/07/22 0555  BP:  120/62 124/68 126/62  Pulse:  95 94 80  Resp:  '18 18 16  '$ Temp: 98.2 F (36.8 C) (!) 97.4 F (36.3 C) 97.9 F (36.6 C) 98.1 F (36.7 C)  TempSrc: Axillary Oral Oral Oral  SpO2:  94% 96% 95%  Weight:      Height:        Intake/Output Summary (Last 24  hours) at 05/07/2022 1117 Last data filed at 05/06/2022 1700 Gross per 24 hour  Intake 480 ml  Output --  Net 480 ml   Filed Weights   04/28/22 1757  Weight: 66.2 kg    Examination:  General exam: Appears calm and comfortable.  Elderly male lying in bed.  No distress.  On room air.   Data Reviewed: I have personally reviewed following labs and imaging studies  CBC: Recent Labs  Lab 05/04/22 0730  WBC 18.1*  NEUTROABS 13.6*  HGB 8.2*  HCT 26.7*  MCV 82.7  PLT 27*   Basic Metabolic Panel: Recent Labs  Lab 05/04/22 0730  NA 134*  K 3.2*  CL 108  CO2 20*  GLUCOSE 93  BUN 14  CREATININE 1.07  CALCIUM 6.9*  MG 1.4*  PHOS 2.5   GFR: Estimated Creatinine Clearance: 35.1 mL/min (by C-G formula based on SCr of 1.07 mg/dL). Liver Function Tests: Recent Labs  Lab 05/04/22 0730  AST 26  ALT 16  ALKPHOS 852*  BILITOT 0.6  PROT 4.2*  ALBUMIN 1.9*   No results for input(s): "LIPASE", "AMYLASE" in the last 168 hours. No results for input(s): "AMMONIA" in the last 168 hours. Coagulation Profile: No results for input(s): "INR", "PROTIME" in the last 168 hours. Cardiac Enzymes: No results for input(s): "CKTOTAL", "CKMB", "CKMBINDEX", "TROPONINI" in the last 168 hours. BNP (last 3 results) No results for input(s): "PROBNP" in the last 8760 hours. HbA1C: No results for input(s): "HGBA1C" in the last 72 hours. CBG: No results for input(s): "GLUCAP" in the last 168 hours. Lipid Profile: No results for input(s): "CHOL", "HDL", "LDLCALC", "TRIG", "CHOLHDL", "  LDLDIRECT" in the last 72 hours. Thyroid Function Tests: No results for input(s): "TSH", "T4TOTAL", "FREET4", "T3FREE", "THYROIDAB" in the last 72 hours. Anemia Panel: No results for input(s): "VITAMINB12", "FOLATE", "FERRITIN", "TIBC", "IRON", "RETICCTPCT" in the last 72 hours. Sepsis Labs: No results for input(s): "PROCALCITON", "LATICACIDVEN" in the last 168 hours.  Recent Results (from the past 240 hour(s))   C difficile quick screen w PCR reflex     Status: None   Collection Time: 04/28/22 11:49 AM   Specimen: STOOL  Result Value Ref Range Status   C Diff antigen NEGATIVE NEGATIVE Final   C Diff toxin NEGATIVE NEGATIVE Final   C Diff interpretation No C. difficile detected.  Final    Comment: Performed at [facility], [place] [address] [place], [place] [address]  GI pathogen panel by PCR, stool     Status: None   Collection Time: 04/28/22 11:49 AM   Specimen: Stool  Result Value Ref Range Status   Plesiomonas shigelloides NOT DETECTED NOT DETECTED Final   Yersinia enterocolitica NOT DETECTED NOT DETECTED Final   Vibrio NOT DETECTED NOT DETECTED Final   Enteropathogenic E coli NOT DETECTED NOT DETECTED Final   E coli (ETEC) LT/ST NOT DETECTED NOT DETECTED Final   E coli [id number] by PCR Not applicable NOT DETECTED Final   Cryptosporidium by PCR NOT DETECTED NOT DETECTED Final   Entamoeba histolytica NOT DETECTED NOT DETECTED Final   Adenovirus F 40/41 NOT DETECTED NOT DETECTED Final   Norovirus GI/GII NOT DETECTED NOT DETECTED Final   Sapovirus NOT DETECTED NOT DETECTED Final    Comment: (NOTE) Performed At: [facility] [place], [place] [id number] Rush Farmer MD [id number]    Vibrio cholerae NOT DETECTED NOT DETECTED Final   Campylobacter by PCR NOT DETECTED NOT DETECTED Final   Salmonella by PCR NOT DETECTED NOT DETECTED Final   E coli (STEC) NOT DETECTED NOT DETECTED Final   Enteroaggregative E coli NOT DETECTED NOT DETECTED Final   Shigella by PCR NOT DETECTED NOT DETECTED Final   Cyclospora cayetanensis NOT DETECTED NOT DETECTED Final   Astrovirus NOT DETECTED NOT DETECTED Final   G lamblia by PCR NOT DETECTED NOT DETECTED Final   Rotavirus A by PCR NOT DETECTED NOT DETECTED Final         Radiology Studies: No results found.      Scheduled Meds:  finasteride  5 mg Oral Daily   latanoprost  1 drop Both Eyes QHS   ondansetron  4 mg  Oral TID AC   pantoprazole  40 mg Oral Daily   predniSONE  5 mg Oral Q breakfast   senna-docusate  1 tablet Oral BID   sertraline  50 mg Oral Daily   tamsulosin  0.8 mg Oral QPC supper   timolol  1 drop Right Eye Daily   Continuous Infusions:        Aline August, MD [facility] 05/07/2022, 11:17 AM

## 2022-05-08 DIAGNOSIS — R627 Adult failure to thrive: Secondary | ICD-10-CM | POA: Diagnosis not present

## 2022-05-08 NOTE — Progress Notes (Addendum)
Manufacturing engineer [facility]) Hospital Liaison Note;  [facility] continues to follow this patient daily through his hospitalization to support and anticipate discharge needs.   Plan is for patient/family to meet with Home Care agency this afternoon at bedside to prepare for possible discharge as early as 05/09/22 if medically stable.   Spoke with patient spouse this morning to support and encourage to express concerns.   Please call with any hospice related concerns or needs,  Thank you,   Gar Ponto, RN [facility] HLT [phone]

## 2022-05-08 NOTE — Progress Notes (Signed)
PROGRESS NOTE    [name]  [id number] DOB: [birth year] DOA: 04/28/2022 PCP: [name], MD   Brief Narrative:  89 y.o. male with medical history significant of hormone refractory prostate cancer-completed a course of palliative radiation to his lumbar spine on 04/15/2022, chronic ITP, paroxysmal A-fib presented with nausea, diarrhea and generalized weakness and was admitted with dehydration and started on IV fluids.  Because of overall poor prognosis, palliative care was consulted.  Patient has decided to proceed with comfort measures and plan is to discharge home with hospice once 24/7 care gets arranged.  TOC following.  Assessment & Plan:   Comfort care measures only/end-of-life care Failure to thrive Paroxysmal A-fib Chronic ITP Multifactorial nausea and vomiting, poor oral intake and chronic diarrhea Metastatic prostate cancer Multiple electrolyte abnormalities Leukocytosis Thrombocytopenia Anemia of chronic disease Hypoalbuminemia  Plan -Patient has decided to proceed with comfort measures and plan is to discharge home with hospice once 24/7 care gets arranged.  TOC following.  DVT prophylaxis: None for comfort measures Code Status: DNR Family Communication: Wife at bedside Disposition Plan: Status is: Inpatient Remains inpatient appropriate because: Of need for home hospice arrangements with 24/7 care, possibly tomorrow  Consultants: Palliative care/oncology  Procedures: None  Antimicrobials: None   Subjective: Patient seen and examined at bedside.  Sleepy, wakes up slightly.  No distress.  Wife at bedside.   Objective: Vitals:   05/06/22 1433 05/07/22 0555 05/07/22 1406 05/08/22 0511  BP: 124/68 126/62 (!) 101/56 119/70  Pulse: 94 80 83 82  Resp: '18 16 18 14  '$ Temp: 97.9 F (36.6 C) 98.1 F (36.7 C) 98 F (36.7 C) 97.8 F (36.6 C)  TempSrc: Oral Oral Oral Oral  SpO2: 96% 95% 93% 95%  Weight:    66.3 kg  Height:        Intake/Output  Summary (Last 24 hours) at 05/08/2022 0800 Last data filed at 05/07/2022 1415 Gross per 24 hour  Intake 240 ml  Output --  Net 240 ml    Filed Weights   04/28/22 1757 05/08/22 0511  Weight: 66.2 kg 66.3 kg    Examination:  General exam: Appears calm and comfortable.  Elderly male lying in bed.  Looks chronically ill and deconditioned. Sleepy, wakes up slightly.  No distress.   Data Reviewed: I have personally reviewed following labs and imaging studies  CBC: Recent Labs  Lab 05/04/22 0730  WBC 18.1*  NEUTROABS 13.6*  HGB 8.2*  HCT 26.7*  MCV 82.7  PLT 27*    Basic Metabolic Panel: Recent Labs  Lab 05/04/22 0730  NA 134*  K 3.2*  CL 108  CO2 20*  GLUCOSE 93  BUN 14  CREATININE 1.07  CALCIUM 6.9*  MG 1.4*  PHOS 2.5    GFR: Estimated Creatinine Clearance: 35.1 mL/min (by C-G formula based on SCr of 1.07 mg/dL). Liver Function Tests: Recent Labs  Lab 05/04/22 0730  AST 26  ALT 16  ALKPHOS 852*  BILITOT 0.6  PROT 4.2*  ALBUMIN 1.9*    No results for input(s): "LIPASE", "AMYLASE" in the last 168 hours. No results for input(s): "AMMONIA" in the last 168 hours. Coagulation Profile: No results for input(s): "INR", "PROTIME" in the last 168 hours. Cardiac Enzymes: No results for input(s): "CKTOTAL", "CKMB", "CKMBINDEX", "TROPONINI" in the last 168 hours. BNP (last 3 results) No results for input(s): "PROBNP" in the last 8760 hours. HbA1C: No results for input(s): "HGBA1C" in the last 72 hours. CBG: No results for input(s): "  GLUCAP" in the last 168 hours. Lipid Profile: No results for input(s): "CHOL", "HDL", "LDLCALC", "TRIG", "CHOLHDL", "LDLDIRECT" in the last 72 hours. Thyroid Function Tests: No results for input(s): "TSH", "T4TOTAL", "FREET4", "T3FREE", "THYROIDAB" in the last 72 hours. Anemia Panel: No results for input(s): "VITAMINB12", "FOLATE", "FERRITIN", "TIBC", "IRON", "RETICCTPCT" in the last 72 hours. Sepsis Labs: No results for input(s):  "PROCALCITON", "LATICACIDVEN" in the last 168 hours.  Recent Results (from the past 240 hour(s))  C difficile quick screen w PCR reflex     Status: None   Collection Time: 04/28/22 11:49 AM   Specimen: STOOL  Result Value Ref Range Status   C Diff antigen NEGATIVE NEGATIVE Final   C Diff toxin NEGATIVE NEGATIVE Final   C Diff interpretation No C. difficile detected.  Final    Comment: Performed at [facility], [place] [address], [place], [place] [address]  GI pathogen panel by PCR, stool     Status: None   Collection Time: 04/28/22 11:49 AM   Specimen: Stool  Result Value Ref Range Status   Plesiomonas shigelloides NOT DETECTED NOT DETECTED Final   Yersinia enterocolitica NOT DETECTED NOT DETECTED Final   Vibrio NOT DETECTED NOT DETECTED Final   Enteropathogenic E coli NOT DETECTED NOT DETECTED Final   E coli (ETEC) LT/ST NOT DETECTED NOT DETECTED Final   E coli [id number] by PCR Not applicable NOT DETECTED Final   Cryptosporidium by PCR NOT DETECTED NOT DETECTED Final   Entamoeba histolytica NOT DETECTED NOT DETECTED Final   Adenovirus F 40/41 NOT DETECTED NOT DETECTED Final   Norovirus GI/GII NOT DETECTED NOT DETECTED Final   Sapovirus NOT DETECTED NOT DETECTED Final    Comment: (NOTE) Performed At: [facility] [place], [place] [id number] Rush Farmer MD [id number]    Vibrio cholerae NOT DETECTED NOT DETECTED Final   Campylobacter by PCR NOT DETECTED NOT DETECTED Final   Salmonella by PCR NOT DETECTED NOT DETECTED Final   E coli (STEC) NOT DETECTED NOT DETECTED Final   Enteroaggregative E coli NOT DETECTED NOT DETECTED Final   Shigella by PCR NOT DETECTED NOT DETECTED Final   Cyclospora cayetanensis NOT DETECTED NOT DETECTED Final   Astrovirus NOT DETECTED NOT DETECTED Final   G lamblia by PCR NOT DETECTED NOT DETECTED Final   Rotavirus A by PCR NOT DETECTED NOT DETECTED Final         Radiology Studies: No results  found.      Scheduled Meds:  finasteride  5 mg Oral Daily   latanoprost  1 drop Both Eyes QHS   ondansetron  4 mg Oral TID AC   pantoprazole  40 mg Oral Daily   predniSONE  5 mg Oral Q breakfast   senna-docusate  1 tablet Oral BID   sertraline  50 mg Oral Daily   tamsulosin  0.8 mg Oral QPC supper   timolol  1 drop Right Eye Daily   Continuous Infusions:        Aline August, MD [facility] 05/08/2022, 8:00 AM

## 2022-05-09 MED ORDER — ACETAMINOPHEN 500 MG PO TABS: 1000.0000 mg | ORAL_TABLET | Freq: Three times a day (TID) | ORAL | Status: AC | PRN

## 2022-05-09 MED ORDER — SENNOSIDES-DOCUSATE SODIUM 8.6-50 MG PO TABS: 1.0000 | ORAL_TABLET | Freq: Two times a day (BID) | ORAL | 0 refills | Status: AC

## 2022-05-09 MED ORDER — BISACODYL 10 MG RE SUPP: 10.0000 mg | Freq: Every day | RECTAL | 0 refills | Status: AC | PRN

## 2022-05-09 MED ORDER — HYDROMORPHONE HCL 2 MG PO TABS: 2.0000 mg | ORAL_TABLET | ORAL | 0 refills | Status: AC | PRN

## 2022-05-09 MED ORDER — LORAZEPAM 0.5 MG PO TABS: 0.5000 mg | ORAL_TABLET | Freq: Three times a day (TID) | ORAL | 0 refills | Status: AC | PRN

## 2022-05-09 NOTE — TOC Transition Note (Signed)
Transition of Care [facility]) - CM/SW Discharge Note   Patient Details  Name: [name] MRN: [id number] Date of Birth: [birth year]  [facility] CM/SW Contact:  Angelita Ingles, RN Phone Number:[phone]  05/09/2022, 10:42 AM   Clinical Narrative:    Patient with discharge orders. CM spoke with wife [name] who wishes to have patient transported via ambulance. CM verified address. Transportation has been arranged for 2:30pm via [facility] per wifes request. No other needs noted at this time. D/c packet is at nurses station. TOC will sign off.    Final next level of care: Home w Hospice Care Barriers to Discharge: No Barriers Identified   Patient Goals and CMS Choice      Discharge Placement                    Name of family member notified: Jakhari Olley Patient and family notified of of transfer: 05/09/22 (notofoed of discharge)  Discharge Plan and Services Additional resources added to the After Visit Summary for   In-house Referral: NA Discharge Planning Services: CM Consult            DME Arranged: N/A DME Agency: NA         [place] Agency: Other - See comment Water quality scientist)        Social Determinants of Health ([place]) Interventions SDOH Screenings   Food Insecurity: No Food Insecurity (04/28/2022)  Housing: Low Risk  (04/28/2022)  Transportation Needs: No Transportation Needs (04/28/2022)  Utilities: Not At Risk (04/28/2022)  Depression (PHQ2-9): Low Risk  (03/21/2020)  Tobacco Use: Medium Risk (04/28/2022)     Readmission Risk Interventions    04/30/2022    2:51 PM 09/09/2019   11:06 AM  Readmission Risk Prevention Plan  Post Dischage Appt  Complete  Medication Screening  Complete  Transportation Screening Complete Complete  PCP or Specialist Appt within 5-7 Days Complete   Home Care Screening Complete   Medication Review (RN CM) Complete

## 2022-05-09 NOTE — Discharge Summary (Signed)
Physician Discharge Summary  [name] [name] DOB: [birth year] DOA: 04/28/2022  PCP: [name], MD  Admit date: 04/28/2022 Discharge date: 05/09/2022  Admitted From: Home Disposition: Home with hospice  Recommendations for Outpatient Follow-up:  Follow up with home hospice at earliest [place]: Home hospice Equipment/Devices: None  Discharge Condition: Poor CODE STATUS: DNR  diet recommendation: As per comfort measures  Brief/Interim Summary: 89 y.o. male with medical history significant of hormone refractory prostate cancer-completed a course of palliative radiation to his lumbar spine on 04/15/2022, chronic ITP, paroxysmal A-fib presented with nausea, diarrhea and generalized weakness and was admitted with dehydration and started on IV fluids.  Because of overall poor prognosis, palliative care was consulted.  Patient has decided to proceed with comfort measures and plan is to discharge home with hospice once 24/7 care gets arranged.  Discharge patient home once arrangements have been made.  Discharge Diagnoses:  Comfort care measures only/end-of-life care Failure to thrive Paroxysmal A-fib Chronic ITP Multifactorial nausea and vomiting, poor oral intake and chronic diarrhea Metastatic prostate cancer Multiple electrolyte abnormalities Leukocytosis Thrombocytopenia Anemia of chronic disease Hypoalbuminemia   Plan -Patient has decided to proceed with comfort measures and plan is to discharge home with hospice once 24/7 care gets arranged.  Discharge patient home once arrangements have been made.    Discharge Instructions   Allergies as of 05/09/2022       Reactions   Antazoline Anaphylaxis   Anesthetics, Amide Other (See Comments)   Cannot urinate afterwards   Calcitonin (salmon) Other (See Comments)   Rhinitis   Antihistamines, Chlorpheniramine-type Anxiety, Other (See Comments)   Nervous, jittery   Diphenhydramine Anxiety    Loratadine Anxiety   Sulfa Antibiotics Rash        Medication List     STOP taking these medications    amoxicillin 500 MG tablet Commonly known as: AMOXIL   atorvastatin 20 MG tablet Commonly known as: LIPITOR   ferrous sulfate 325 (65 FE) MG tablet   loperamide 2 MG capsule Commonly known as: IMODIUM   loperamide 2 MG tablet Commonly known as: IMODIUM A-D   metoprolol tartrate 25 MG tablet Commonly known as: LOPRESSOR   nitroGLYCERIN 0.4 MG SL tablet Commonly known as: NITROSTAT   PreserVision AREDS Caps   ranibizumab 0.5 MG/0.05ML Soln Commonly known as: LUCENTIS   romiPLOStim 250 MCG injection Commonly known as: NPLATE   traMADol 50 MG tablet Commonly known as: ULTRAM   Vitamin D3 50 MCG (2000 UT) Tabs Generic drug: Cholecalciferol       TAKE these medications    acetaminophen 500 MG tablet Commonly known as: TYLENOL Take 2 tablets (1,000 mg total) by mouth every 8 (eight) hours as needed for moderate pain. What changed:  how much to take reasons to take this   bisacodyl 10 MG suppository Commonly known as: DULCOLAX Place 1 suppository (10 mg total) rectally daily as needed for moderate constipation.   finasteride 5 MG tablet Commonly known as: PROSCAR Take 5 mg by mouth daily.   HYDROmorphone 2 MG tablet Commonly known as: Dilaudid Take 1 tablet (2 mg total) by mouth every 4 (four) hours as needed for severe pain.   latanoprost 0.005 % ophthalmic solution Commonly known as: XALATAN Place 1 drop into the right eye at bedtime.   LORazepam 0.5 MG tablet Commonly known as: Ativan Take 1 tablet (0.5 mg total) by mouth every 8 (eight) hours as needed for anxiety.   ondansetron  8 MG tablet Commonly known as: ZOFRAN Take 1 tablet (8 mg total) by mouth every 8 (eight) hours as needed for nausea or vomiting.   pantoprazole 40 MG tablet Commonly known as: PROTONIX Take 1 tablet (40 mg total) by mouth daily.   predniSONE 5 MG  tablet Commonly known as: DELTASONE Take 1 tablet (5 mg total) by mouth daily with breakfast.   senna-docusate 8.6-50 MG tablet Commonly known as: Senokot-S Take 1 tablet by mouth 2 (two) times daily.   sertraline 50 MG tablet Commonly known as: ZOLOFT Take 50 mg by mouth daily.   tamsulosin 0.4 MG Caps capsule Commonly known as: FLOMAX Take 0.8 mg by mouth at bedtime.   timolol 0.5 % ophthalmic solution Commonly known as: TIMOPTIC Place 1 drop into the right eye daily.         Follow-up Information     Care, [facility] Follow up.   Specialty: Home Health Services Why: Your home health has been set up with [facility]. The office will call you with start of service information. If you have any questions or concerns please call the number listed above Contact information: [address] [place] [address] [phone]                Allergies  Allergen Reactions   Antazoline Anaphylaxis   Anesthetics, Amide Other (See Comments)    Cannot urinate afterwards   Calcitonin ([place]) Other (See Comments)    Rhinitis   Antihistamines, Chlorpheniramine-Type Anxiety and Other (See Comments)    Nervous, jittery   Diphenhydramine Anxiety   Loratadine Anxiety   Sulfa Antibiotics Rash    Consultations: Palliative care/oncology   Procedures/Studies: CT ABDOMEN PELVIS W CONTRAST  Result Date: 04/29/2022 CLINICAL DATA:  Abdominal pain.  History of prostate cancer. EXAM: CT ABDOMEN AND PELVIS WITH CONTRAST TECHNIQUE: Multidetector CT imaging of the abdomen and pelvis was performed using the standard protocol following bolus administration of intravenous contrast. RADIATION DOSE REDUCTION: This exam was performed according to the departmental dose-optimization program which includes automated exposure control, adjustment of the mA and/or kV according to patient size and/or use of iterative reconstruction technique. CONTRAST:  31m OMNIPAQUE IOHEXOL 300  MG/ML  SOLN COMPARISON:  Virtual colonoscopy 06/25/2021 FINDINGS: Lower chest: Chronic interstitial opacities in the lung bases appear unchanged. Left perihilar/left upper lobe pulmonary nodule present measuring 3.8 x 3.2 cm. Hepatobiliary: No focal liver abnormality is seen. No gallstones, gallbladder wall thickening, or biliary dilatation. Pancreas: Scattered pancreatic calcifications are present compatible with chronic pancreatitis. No acute inflammation. Spleen: Surgically absent. Adrenals/Urinary Tract: There is no hydronephrosis or perinephric fluid. There is a 2.4 cm cyst in the right kidney. The adrenal glands and bladder are within normal limits. Stomach/Bowel: Stomach is within normal limits. No evidence of bowel wall thickening, distention, or inflammatory changes. The appendix is not visualized. Vascular/Lymphatic: Aortic atherosclerosis. No enlarged abdominal or pelvic lymph nodes. Reproductive: Prostate gland is enlarged, unchanged. Other: No abdominal wall hernia or abnormality. No abdominopelvic ascites. There is mild body wall edema. Musculoskeletal: Diffuse osseous metastatic disease is progressed no acute fractures are seen. IMPRESSION: 1. No acute localizing process in the abdomen or pelvis. 2. Diffuse osseous metastatic disease is progressed. 3. Left perihilar/left upper lobe pulmonary nodule worrisome for metastatic disease. 4. Mild body wall edema. 5. Stable right renal cysts.  No follow-up imaging recommended. Aortic Atherosclerosis (ICD10-I70.0). Electronically Signed   By: ARonney AstersM.D.   On: 04/29/2022 21:58  Subjective: Patient seen and examined at bedside.  Currently no distress.  Discharge Exam: Vitals:   05/07/22 1406 05/08/22 0511  BP: (!) 101/56 119/70  Pulse: 83 82  Resp: 18 14  Temp: 98 F (36.7 C) 97.8 F (36.6 C)  SpO2: 93% 95%    General: Elderly male lying in bed.  Currently no distress.     The results of significant diagnostics from this  hospitalization (including imaging, microbiology, ancillary and laboratory) are listed below for reference.     Microbiology: No results found for this or any previous visit (from the past 240 hour(s)).   Labs: BNP (last 3 results) No results for input(s): "BNP" in the last 8760 hours. Basic Metabolic Panel: Recent Labs  Lab 05/04/22 0730  NA 134*  K 3.2*  CL 108  CO2 20*  GLUCOSE 93  BUN 14  CREATININE 1.07  CALCIUM 6.9*  MG 1.4*  PHOS 2.5   Liver Function Tests: Recent Labs  Lab 05/04/22 0730  AST 26  ALT 16  ALKPHOS 852*  BILITOT 0.6  PROT 4.2*  ALBUMIN 1.9*   No results for input(s): "LIPASE", "AMYLASE" in the last 168 hours. No results for input(s): "AMMONIA" in the last 168 hours. CBC: Recent Labs  Lab 05/04/22 0730  WBC 18.1*  NEUTROABS 13.6*  HGB 8.2*  HCT 26.7*  MCV 82.7  PLT 27*   Cardiac Enzymes: No results for input(s): "CKTOTAL", "CKMB", "CKMBINDEX", "TROPONINI" in the last 168 hours. BNP: Invalid input(s): "POCBNP" CBG: No results for input(s): "GLUCAP" in the last 168 hours. D-Dimer No results for input(s): "DDIMER" in the last 72 hours. Hgb A1c No results for input(s): "HGBA1C" in the last 72 hours. Lipid Profile No results for input(s): "CHOL", "HDL", "LDLCALC", "TRIG", "CHOLHDL", "LDLDIRECT" in the last 72 hours. Thyroid function studies No results for input(s): "TSH", "T4TOTAL", "T3FREE", "THYROIDAB" in the last 72 hours.  Invalid input(s): "FREET3" Anemia work up No results for input(s): "VITAMINB12", "FOLATE", "FERRITIN", "TIBC", "IRON", "RETICCTPCT" in the last 72 hours. Urinalysis    Component Value Date/Time   COLORURINE YELLOW 10/12/2019 1132   APPEARANCEUR CLEAR 10/12/2019 1132   LABSPEC 1.021 10/12/2019 1132   PHURINE 5.5 10/12/2019 1132   GLUCOSEU NEGATIVE 10/12/2019 1132   HGBUR TRACE (A) 10/12/2019 1132   BILIRUBINUR NEGATIVE 09/16/2019 1255   KETONESUR TRACE (A) 10/12/2019 1132   PROTEINUR 2+ (A) 10/12/2019  1132   NITRITE NEGATIVE 10/12/2019 1132   LEUKOCYTESUR NEGATIVE 10/12/2019 1132   Sepsis Labs Recent Labs  Lab 05/04/22 0730  WBC 18.1*   Microbiology No results found for this or any previous visit (from the past 240 hour(s)).   Time coordinating discharge: 35 minutes  SIGNED:   Aline August, MD  [facility] 05/09/2022, 8:06 AM

## 2022-05-09 NOTE — Progress Notes (Signed)
[facility]) Hospital Liaison Note  Met with patient and daughter at bedside. Patient did not awaken to verbal stimuli. Answered questions and reassured daughter. Plan is for 1430 discharge today by ambulance.  Comfort medications have been arranged. Please send completed and signed DNR home with patient at discharge.  Please call with any hospice related questions or concerns.  Thank you, Margaretmary Eddy, BSN, RN [facility] Liaison  [phone]

## 2022-05-09 NOTE — Plan of Care (Signed)
Patient's daughters educated on all medications and timeframe for administration. Discharge paperwork and socialwork packet presented to family. All personal belongings gathered, and patient dressed, L PIV removed, all questions answered and awaiting ambulance to pick patient up at 1430.   Problem: Education: Goal: Knowledge of General Education information will improve Description: Including pain rating scale, medication(s)/side effects and non-pharmacologic comfort measures Outcome: Adequate for Discharge   Problem: Health Behavior/Discharge Planning: Goal: Ability to manage health-related needs will improve Outcome: Adequate for Discharge   Problem: Clinical Measurements: Goal: Ability to maintain clinical measurements within normal limits will improve Outcome: Adequate for Discharge Goal: Will remain free from infection Outcome: Adequate for Discharge Goal: Diagnostic test results will improve Outcome: Adequate for Discharge Goal: Respiratory complications will improve Outcome: Adequate for Discharge Goal: Cardiovascular complication will be avoided Outcome: Adequate for Discharge   Problem: Activity: Goal: Risk for activity intolerance will decrease Outcome: Adequate for Discharge   Problem: Nutrition: Goal: Adequate nutrition will be maintained Outcome: Adequate for Discharge   Problem: Coping: Goal: Level of anxiety will decrease Outcome: Adequate for Discharge   Problem: Elimination: Goal: Will not experience complications related to bowel motility Outcome: Adequate for Discharge Goal: Will not experience complications related to urinary retention Outcome: Adequate for Discharge   Problem: Pain Managment: Goal: General experience of comfort will improve Outcome: Adequate for Discharge   Problem: Safety: Goal: Ability to remain free from injury will improve Outcome: Adequate for Discharge   Problem: Skin Integrity: Goal: Risk for impaired skin integrity will  decrease Outcome: Adequate for Discharge

## 2022-05-10 DIAGNOSIS — I2510 Atherosclerotic heart disease of native coronary artery without angina pectoris: Secondary | ICD-10-CM | POA: Diagnosis not present

## 2022-05-10 DIAGNOSIS — Z856 Personal history of leukemia: Secondary | ICD-10-CM | POA: Diagnosis not present

## 2022-05-10 DIAGNOSIS — H409 Unspecified glaucoma: Secondary | ICD-10-CM | POA: Diagnosis not present

## 2022-05-10 DIAGNOSIS — I4891 Unspecified atrial fibrillation: Secondary | ICD-10-CM | POA: Diagnosis not present

## 2022-05-10 DIAGNOSIS — Z85828 Personal history of other malignant neoplasm of skin: Secondary | ICD-10-CM | POA: Diagnosis not present

## 2022-05-10 DIAGNOSIS — R627 Adult failure to thrive: Secondary | ICD-10-CM | POA: Diagnosis not present

## 2022-05-10 DIAGNOSIS — H3530 Unspecified macular degeneration: Secondary | ICD-10-CM | POA: Diagnosis not present

## 2022-05-10 DIAGNOSIS — C61 Malignant neoplasm of prostate: Secondary | ICD-10-CM | POA: Diagnosis not present

## 2022-05-10 DIAGNOSIS — D693 Immune thrombocytopenic purpura: Secondary | ICD-10-CM | POA: Diagnosis not present

## 2022-05-10 DIAGNOSIS — C7951 Secondary malignant neoplasm of bone: Secondary | ICD-10-CM | POA: Diagnosis not present

## 2022-05-10 DIAGNOSIS — Z8619 Personal history of other infectious and parasitic diseases: Secondary | ICD-10-CM | POA: Diagnosis not present

## 2022-05-10 DIAGNOSIS — K219 Gastro-esophageal reflux disease without esophagitis: Secondary | ICD-10-CM | POA: Diagnosis not present

## 2022-05-10 DIAGNOSIS — I10 Essential (primary) hypertension: Secondary | ICD-10-CM | POA: Diagnosis not present

## 2022-05-10 DIAGNOSIS — S32009D Unspecified fracture of unspecified lumbar vertebra, subsequent encounter for fracture with routine healing: Secondary | ICD-10-CM | POA: Diagnosis not present

## 2022-05-10 DIAGNOSIS — Z85038 Personal history of other malignant neoplasm of large intestine: Secondary | ICD-10-CM | POA: Diagnosis not present

## 2022-05-10 DIAGNOSIS — R5381 Other malaise: Secondary | ICD-10-CM | POA: Diagnosis not present

## 2022-05-10 DIAGNOSIS — E119 Type 2 diabetes mellitus without complications: Secondary | ICD-10-CM | POA: Diagnosis not present

## 2022-05-11 DIAGNOSIS — D693 Immune thrombocytopenic purpura: Secondary | ICD-10-CM | POA: Diagnosis not present

## 2022-05-11 DIAGNOSIS — Z856 Personal history of leukemia: Secondary | ICD-10-CM | POA: Diagnosis not present

## 2022-05-11 DIAGNOSIS — R627 Adult failure to thrive: Secondary | ICD-10-CM | POA: Diagnosis not present

## 2022-05-11 DIAGNOSIS — C7951 Secondary malignant neoplasm of bone: Secondary | ICD-10-CM | POA: Diagnosis not present

## 2022-05-11 DIAGNOSIS — C61 Malignant neoplasm of prostate: Secondary | ICD-10-CM | POA: Diagnosis not present

## 2022-05-11 DIAGNOSIS — R5381 Other malaise: Secondary | ICD-10-CM | POA: Diagnosis not present

## 2022-05-12 ENCOUNTER — Inpatient Hospital Stay: Payer: Medicare Other

## 2022-05-12 DIAGNOSIS — R627 Adult failure to thrive: Secondary | ICD-10-CM | POA: Diagnosis not present

## 2022-05-12 DIAGNOSIS — C61 Malignant neoplasm of prostate: Secondary | ICD-10-CM | POA: Diagnosis not present

## 2022-05-12 DIAGNOSIS — C7951 Secondary malignant neoplasm of bone: Secondary | ICD-10-CM | POA: Diagnosis not present

## 2022-05-12 DIAGNOSIS — Z856 Personal history of leukemia: Secondary | ICD-10-CM | POA: Diagnosis not present

## 2022-05-12 DIAGNOSIS — R5381 Other malaise: Secondary | ICD-10-CM | POA: Diagnosis not present

## 2022-05-12 DIAGNOSIS — D693 Immune thrombocytopenic purpura: Secondary | ICD-10-CM | POA: Diagnosis not present

## 2022-05-13 ENCOUNTER — Ambulatory Visit: Payer: Self-pay

## 2022-05-13 ENCOUNTER — Ambulatory Visit
Admission: RE | Admit: 2022-05-13 | Discharge: 2022-05-13 | Disposition: A | Discharge status: Home / Self Care | Payer: Medicare Other | Source: Ambulatory Visit | Attending: Radiation Oncology | Admitting: Radiation Oncology

## 2022-05-13 DIAGNOSIS — R627 Adult failure to thrive: Secondary | ICD-10-CM | POA: Diagnosis not present

## 2022-05-13 DIAGNOSIS — R5381 Other malaise: Secondary | ICD-10-CM | POA: Diagnosis not present

## 2022-05-13 DIAGNOSIS — Z856 Personal history of leukemia: Secondary | ICD-10-CM | POA: Diagnosis not present

## 2022-05-13 DIAGNOSIS — D693 Immune thrombocytopenic purpura: Secondary | ICD-10-CM | POA: Diagnosis not present

## 2022-05-13 DIAGNOSIS — C7951 Secondary malignant neoplasm of bone: Secondary | ICD-10-CM | POA: Diagnosis not present

## 2022-05-13 DIAGNOSIS — C61 Malignant neoplasm of prostate: Secondary | ICD-10-CM | POA: Diagnosis not present

## 2022-05-13 NOTE — Patient Outreach (Signed)
  Care Coordination   Follow Up Visit Note   05/13/2022 Name: [name] MRN: [id number] DOB: 06-05-1923  [name] is a 89 y.o. year old male who sees [name], MD for primary care. I spoke with wife [name] by phone today.  What matters to the patients health and wellness today?  Patient is now under the care of Hospice.     Goals Addressed               This Visit's Progress     Patient Stated     COMPLETED: I was prescribed a new medication to help treat my prostate cancer (pt-stated)        Care Coordination Interventions: Completed successful outbound call with wife [name]  Reviewed and discussed recent hospitalization and orders for Hospice through [facility] Confirmed with wife that Hospice care is now in place and patient has all care needed in place Discussed with wife that patient will be dis-enrolled from the [place] program at this time   Provided wife this RN CC contact number and encouraged a call if needed      Other     COMPLETED: RN Care Coordination Activities: further follow up needed        Care Coordination Interventions: Completed successful outbound call with wife [name]  Reviewed and discussed recent hospitalization and orders for Hospice through [facility] Confirmed with wife that Hospice care is now in place and patient has all care needed in place Discussed with wife that patient will be dis-enrolled from the [place] program at this time   Provided wife this RN CC contact number and encouraged a call if needed          SDOH assessments and interventions completed:  No     Care Coordination Interventions:  Yes, provided   Follow up plan: No further intervention required.   Encounter Outcome:  Pt. Visit Completed

## 2022-05-13 NOTE — Progress Notes (Signed)
  Radiation Oncology         [phone] ________________________________  Name: [name] MRN: [id number]  Date of Service: 05/13/2022  DOB: May 27, 1923  Post Treatment Telephone Note  Diagnosis:  89 y.o. 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  Intent: Palliative  Radiation Treatment Dates: 04/03/2022 through 04/16/2022 Site Technique Total Dose (Gy) Dose per Fx (Gy) Completed Fx Beam Energies  Lumbar Spine: Spine 3D 30/30 3 10/10 10X, 15X   (as documented in provider EOT note)   The patient was available for call today.  The patient did note fatigue during radiation but has since improved. The patient did not note skin changes in the field of radiation during therapy. The patient has noticed improvement in pain in the area(s) treated with radiation. The patient is not taking dexamethasone. The patient does have symptoms of  weakness but NO loss of control of the extremities. The patient does not have symptoms of headache. The patient does not have symptoms of seizure or uncontrolled movement. The patient does not have symptoms of changes in vision. The patient does not have changes in speech. The patient does not have confusion.  Patient currently experiencing nausea and has been placed on hospice care.  The patient is not scheduled for ongoing care with Dr. Benay Spice in medical oncology. The patient was encouraged to call if he  develops concerns or questions regarding radiation.  This concludes the interview.   Leandra Kern, LPN

## 2022-05-13 NOTE — Patient Instructions (Signed)
Visit Information  Thank you for taking time to visit with me today. Please don't hesitate to contact me if I can be of assistance to you.   Following are the goals we discussed today:   Goals Addressed               This Visit's Progress     Patient Stated     COMPLETED: I was prescribed a new medication to help treat my prostate cancer (pt-stated)        Care Coordination Interventions: Completed successful outbound call with wife [name]  Reviewed and discussed recent hospitalization and orders for Hospice through [facility] Confirmed with wife that Hospice care is now in place and patient has all care needed in place Discussed with wife that patient will be dis-enrolled from the [place] program at this time   Provided wife this RN CC contact number and encouraged a call if needed      Other     COMPLETED: RN Care Coordination Activities: further follow up needed        Care Coordination Interventions: Completed successful outbound call with wife [name]  Reviewed and discussed recent hospitalization and orders for Hospice through [facility] Confirmed with wife that Hospice care is now in place and patient has all care needed in place Discussed with wife that patient will be dis-enrolled from the [place] program at this time   Provided wife this RN CC contact number and encouraged a call if needed         If you are experiencing a Mental Health or [place] or need someone to talk to, please call 1-800-273-TALK (toll free, 24 hour hotline) go to [facility] [place] [phone])  Patient verbalizes understanding of instructions and care plan provided today and agrees to view in [place]. Active MyChart status and patient understanding of how to access instructions and care plan via MyChart confirmed with patient.     Barb Merino, RN, BSN, CCM Care Management Coordinator [facility]  Care Management  Direct Phone: [phone]

## 2022-05-14 DIAGNOSIS — C7951 Secondary malignant neoplasm of bone: Secondary | ICD-10-CM | POA: Diagnosis not present

## 2022-05-14 DIAGNOSIS — C61 Malignant neoplasm of prostate: Secondary | ICD-10-CM | POA: Diagnosis not present

## 2022-05-14 DIAGNOSIS — R627 Adult failure to thrive: Secondary | ICD-10-CM | POA: Diagnosis not present

## 2022-05-14 DIAGNOSIS — D693 Immune thrombocytopenic purpura: Secondary | ICD-10-CM | POA: Diagnosis not present

## 2022-05-14 DIAGNOSIS — R5381 Other malaise: Secondary | ICD-10-CM | POA: Diagnosis not present

## 2022-05-14 DIAGNOSIS — Z856 Personal history of leukemia: Secondary | ICD-10-CM | POA: Diagnosis not present

## 2022-05-16 ENCOUNTER — Telehealth: Payer: Self-pay | Admitting: *Deleted

## 2022-05-16 DIAGNOSIS — D693 Immune thrombocytopenic purpura: Secondary | ICD-10-CM | POA: Diagnosis not present

## 2022-05-16 DIAGNOSIS — C7951 Secondary malignant neoplasm of bone: Secondary | ICD-10-CM | POA: Diagnosis not present

## 2022-05-16 DIAGNOSIS — R5381 Other malaise: Secondary | ICD-10-CM | POA: Diagnosis not present

## 2022-05-16 DIAGNOSIS — R627 Adult failure to thrive: Secondary | ICD-10-CM | POA: Diagnosis not present

## 2022-05-16 DIAGNOSIS — C61 Malignant neoplasm of prostate: Secondary | ICD-10-CM | POA: Diagnosis not present

## 2022-05-16 DIAGNOSIS — Z856 Personal history of leukemia: Secondary | ICD-10-CM | POA: Diagnosis not present

## 2022-05-16 NOTE — Telephone Encounter (Signed)
Left VM for Mrs. [name] to call office w/update on Mr. Raman and if he still wants to see Dr. Benay Spice on Monday.

## 2022-05-16 NOTE — Telephone Encounter (Signed)
[name] reports [name] is doing well. Eats bites and drinks fluids. Uses [facility]. Some SOB w/exertion-hospice RN ordering oxygen for prn use. Continues prednisone and pain controlled w/Tramadol. No bleeding. CNA doing well with him. He is getting a lot of visits from friends and past co-workers. She said to cancel the visit for Monday and they will call as needed.

## 2022-05-19 ENCOUNTER — Inpatient Hospital Stay: Payer: Medicare Other

## 2022-05-19 ENCOUNTER — Inpatient Hospital Stay: Payer: Medicare Other | Admitting: Nurse Practitioner

## 2022-05-19 DIAGNOSIS — C7951 Secondary malignant neoplasm of bone: Secondary | ICD-10-CM | POA: Diagnosis not present

## 2022-05-19 DIAGNOSIS — R5381 Other malaise: Secondary | ICD-10-CM | POA: Diagnosis not present

## 2022-05-19 DIAGNOSIS — D693 Immune thrombocytopenic purpura: Secondary | ICD-10-CM | POA: Diagnosis not present

## 2022-05-19 DIAGNOSIS — Z856 Personal history of leukemia: Secondary | ICD-10-CM | POA: Diagnosis not present

## 2022-05-19 DIAGNOSIS — R627 Adult failure to thrive: Secondary | ICD-10-CM | POA: Diagnosis not present

## 2022-05-19 DIAGNOSIS — C61 Malignant neoplasm of prostate: Secondary | ICD-10-CM | POA: Diagnosis not present

## 2022-05-20 DIAGNOSIS — R5381 Other malaise: Secondary | ICD-10-CM | POA: Diagnosis not present

## 2022-05-20 DIAGNOSIS — C7951 Secondary malignant neoplasm of bone: Secondary | ICD-10-CM | POA: Diagnosis not present

## 2022-05-20 DIAGNOSIS — D693 Immune thrombocytopenic purpura: Secondary | ICD-10-CM | POA: Diagnosis not present

## 2022-05-20 DIAGNOSIS — R627 Adult failure to thrive: Secondary | ICD-10-CM | POA: Diagnosis not present

## 2022-05-20 DIAGNOSIS — Z856 Personal history of leukemia: Secondary | ICD-10-CM | POA: Diagnosis not present

## 2022-05-20 DIAGNOSIS — C61 Malignant neoplasm of prostate: Secondary | ICD-10-CM | POA: Diagnosis not present

## 2022-05-21 DIAGNOSIS — C61 Malignant neoplasm of prostate: Secondary | ICD-10-CM | POA: Diagnosis not present

## 2022-05-21 DIAGNOSIS — C7951 Secondary malignant neoplasm of bone: Secondary | ICD-10-CM | POA: Diagnosis not present

## 2022-05-21 DIAGNOSIS — D693 Immune thrombocytopenic purpura: Secondary | ICD-10-CM | POA: Diagnosis not present

## 2022-05-21 DIAGNOSIS — R627 Adult failure to thrive: Secondary | ICD-10-CM | POA: Diagnosis not present

## 2022-05-21 DIAGNOSIS — Z856 Personal history of leukemia: Secondary | ICD-10-CM | POA: Diagnosis not present

## 2022-05-21 DIAGNOSIS — R5381 Other malaise: Secondary | ICD-10-CM | POA: Diagnosis not present

## 2022-05-23 DIAGNOSIS — R627 Adult failure to thrive: Secondary | ICD-10-CM | POA: Diagnosis not present

## 2022-05-23 DIAGNOSIS — Z856 Personal history of leukemia: Secondary | ICD-10-CM | POA: Diagnosis not present

## 2022-05-23 DIAGNOSIS — C7951 Secondary malignant neoplasm of bone: Secondary | ICD-10-CM | POA: Diagnosis not present

## 2022-05-23 DIAGNOSIS — C61 Malignant neoplasm of prostate: Secondary | ICD-10-CM | POA: Diagnosis not present

## 2022-05-23 DIAGNOSIS — D693 Immune thrombocytopenic purpura: Secondary | ICD-10-CM | POA: Diagnosis not present

## 2022-05-23 DIAGNOSIS — R5381 Other malaise: Secondary | ICD-10-CM | POA: Diagnosis not present

## 2022-05-27 DIAGNOSIS — C61 Malignant neoplasm of prostate: Secondary | ICD-10-CM | POA: Diagnosis not present

## 2022-05-27 DIAGNOSIS — R627 Adult failure to thrive: Secondary | ICD-10-CM | POA: Diagnosis not present

## 2022-05-27 DIAGNOSIS — D693 Immune thrombocytopenic purpura: Secondary | ICD-10-CM | POA: Diagnosis not present

## 2022-05-27 DIAGNOSIS — Z856 Personal history of leukemia: Secondary | ICD-10-CM | POA: Diagnosis not present

## 2022-05-27 DIAGNOSIS — C7951 Secondary malignant neoplasm of bone: Secondary | ICD-10-CM | POA: Diagnosis not present

## 2022-05-27 DIAGNOSIS — R5381 Other malaise: Secondary | ICD-10-CM | POA: Diagnosis not present

## 2022-05-30 DIAGNOSIS — R627 Adult failure to thrive: Secondary | ICD-10-CM | POA: Diagnosis not present

## 2022-05-30 DIAGNOSIS — R5381 Other malaise: Secondary | ICD-10-CM | POA: Diagnosis not present

## 2022-05-30 DIAGNOSIS — D693 Immune thrombocytopenic purpura: Secondary | ICD-10-CM | POA: Diagnosis not present

## 2022-05-30 DIAGNOSIS — C7951 Secondary malignant neoplasm of bone: Secondary | ICD-10-CM | POA: Diagnosis not present

## 2022-05-30 DIAGNOSIS — C61 Malignant neoplasm of prostate: Secondary | ICD-10-CM | POA: Diagnosis not present

## 2022-05-30 DIAGNOSIS — Z856 Personal history of leukemia: Secondary | ICD-10-CM | POA: Diagnosis not present

## 2022-06-02 ENCOUNTER — Telehealth: Payer: Self-pay | Admitting: *Deleted

## 2022-06-02 DIAGNOSIS — H3530 Unspecified macular degeneration: Secondary | ICD-10-CM | POA: Diagnosis not present

## 2022-06-02 DIAGNOSIS — C7951 Secondary malignant neoplasm of bone: Secondary | ICD-10-CM | POA: Diagnosis not present

## 2022-06-02 DIAGNOSIS — E119 Type 2 diabetes mellitus without complications: Secondary | ICD-10-CM | POA: Diagnosis not present

## 2022-06-02 DIAGNOSIS — C61 Malignant neoplasm of prostate: Secondary | ICD-10-CM | POA: Diagnosis not present

## 2022-06-02 DIAGNOSIS — Z856 Personal history of leukemia: Secondary | ICD-10-CM | POA: Diagnosis not present

## 2022-06-02 DIAGNOSIS — I2510 Atherosclerotic heart disease of native coronary artery without angina pectoris: Secondary | ICD-10-CM | POA: Diagnosis not present

## 2022-06-02 DIAGNOSIS — H409 Unspecified glaucoma: Secondary | ICD-10-CM | POA: Diagnosis not present

## 2022-06-02 DIAGNOSIS — I10 Essential (primary) hypertension: Secondary | ICD-10-CM | POA: Diagnosis not present

## 2022-06-02 DIAGNOSIS — R5381 Other malaise: Secondary | ICD-10-CM | POA: Diagnosis not present

## 2022-06-02 DIAGNOSIS — Z8619 Personal history of other infectious and parasitic diseases: Secondary | ICD-10-CM | POA: Diagnosis not present

## 2022-06-02 DIAGNOSIS — Z85828 Personal history of other malignant neoplasm of skin: Secondary | ICD-10-CM | POA: Diagnosis not present

## 2022-06-02 DIAGNOSIS — S32009D Unspecified fracture of unspecified lumbar vertebra, subsequent encounter for fracture with routine healing: Secondary | ICD-10-CM | POA: Diagnosis not present

## 2022-06-02 DIAGNOSIS — Z85038 Personal history of other malignant neoplasm of large intestine: Secondary | ICD-10-CM | POA: Diagnosis not present

## 2022-06-02 DIAGNOSIS — K219 Gastro-esophageal reflux disease without esophagitis: Secondary | ICD-10-CM | POA: Diagnosis not present

## 2022-06-02 DIAGNOSIS — D693 Immune thrombocytopenic purpura: Secondary | ICD-10-CM | POA: Diagnosis not present

## 2022-06-02 DIAGNOSIS — R627 Adult failure to thrive: Secondary | ICD-10-CM | POA: Diagnosis not present

## 2022-06-02 DIAGNOSIS — I4891 Unspecified atrial fibrillation: Secondary | ICD-10-CM | POA: Diagnosis not present

## 2022-06-02 NOTE — Telephone Encounter (Signed)
Per Dr. Benay Spice: Nplate will not make him feel any better. He would need to come off Hospice if he resumed this. He is welcome to schedule a telephone visit to discuss further. Mrs. Krumm made aware. They do not need appointment at this time.

## 2022-06-02 NOTE — Telephone Encounter (Signed)
[name] called to report [name] is doing OK. Is very weak, but gets up daily and still able to give orders to everyone.  He is very worried that he is not receiving the Nplate and is asking if receiving the Nplate again would benefit him?  He is willing come off Hospice if [name] thinks it would help him.

## 2022-06-03 ENCOUNTER — Telehealth: Payer: Self-pay | Admitting: Cardiovascular Disease

## 2022-06-03 DIAGNOSIS — D693 Immune thrombocytopenic purpura: Secondary | ICD-10-CM | POA: Diagnosis not present

## 2022-06-03 DIAGNOSIS — R627 Adult failure to thrive: Secondary | ICD-10-CM | POA: Diagnosis not present

## 2022-06-03 DIAGNOSIS — R5381 Other malaise: Secondary | ICD-10-CM | POA: Diagnosis not present

## 2022-06-03 DIAGNOSIS — Z856 Personal history of leukemia: Secondary | ICD-10-CM | POA: Diagnosis not present

## 2022-06-03 DIAGNOSIS — C7951 Secondary malignant neoplasm of bone: Secondary | ICD-10-CM | POA: Diagnosis not present

## 2022-06-03 DIAGNOSIS — C61 Malignant neoplasm of prostate: Secondary | ICD-10-CM | POA: Diagnosis not present

## 2022-06-03 NOTE — Telephone Encounter (Signed)
Caller stated patient wants Dr. Angelena Form to be aware patient is having increased SOB with exertion.  Caller stated patient is currently in hospice care and patient wants to know if Dr. Angelena Form can review his chart and provide options to address his shortness of breath.

## 2022-06-04 NOTE — Telephone Encounter (Signed)
Left detailed message on [name]'s phone that Dr. Angelena Form is out of town this week and may not see the messge until the weekend.  Adv to call back if we can help or if she would like to talk/provide an update for Dr. Angelena Form.

## 2022-06-05 DIAGNOSIS — D693 Immune thrombocytopenic purpura: Secondary | ICD-10-CM | POA: Diagnosis not present

## 2022-06-05 DIAGNOSIS — C61 Malignant neoplasm of prostate: Secondary | ICD-10-CM | POA: Diagnosis not present

## 2022-06-05 DIAGNOSIS — R5381 Other malaise: Secondary | ICD-10-CM | POA: Diagnosis not present

## 2022-06-05 DIAGNOSIS — Z856 Personal history of leukemia: Secondary | ICD-10-CM | POA: Diagnosis not present

## 2022-06-05 DIAGNOSIS — C7951 Secondary malignant neoplasm of bone: Secondary | ICD-10-CM | POA: Diagnosis not present

## 2022-06-05 DIAGNOSIS — R627 Adult failure to thrive: Secondary | ICD-10-CM | POA: Diagnosis not present

## 2022-06-06 DIAGNOSIS — R627 Adult failure to thrive: Secondary | ICD-10-CM | POA: Diagnosis not present

## 2022-06-06 DIAGNOSIS — C61 Malignant neoplasm of prostate: Secondary | ICD-10-CM | POA: Diagnosis not present

## 2022-06-06 DIAGNOSIS — R5381 Other malaise: Secondary | ICD-10-CM | POA: Diagnosis not present

## 2022-06-06 DIAGNOSIS — D693 Immune thrombocytopenic purpura: Secondary | ICD-10-CM | POA: Diagnosis not present

## 2022-06-06 DIAGNOSIS — Z856 Personal history of leukemia: Secondary | ICD-10-CM | POA: Diagnosis not present

## 2022-06-06 DIAGNOSIS — C7951 Secondary malignant neoplasm of bone: Secondary | ICD-10-CM | POA: Diagnosis not present

## 2022-06-10 DIAGNOSIS — Z856 Personal history of leukemia: Secondary | ICD-10-CM | POA: Diagnosis not present

## 2022-06-10 DIAGNOSIS — D693 Immune thrombocytopenic purpura: Secondary | ICD-10-CM | POA: Diagnosis not present

## 2022-06-10 DIAGNOSIS — C61 Malignant neoplasm of prostate: Secondary | ICD-10-CM | POA: Diagnosis not present

## 2022-06-10 DIAGNOSIS — R5381 Other malaise: Secondary | ICD-10-CM | POA: Diagnosis not present

## 2022-06-10 DIAGNOSIS — R627 Adult failure to thrive: Secondary | ICD-10-CM | POA: Diagnosis not present

## 2022-06-10 DIAGNOSIS — C7951 Secondary malignant neoplasm of bone: Secondary | ICD-10-CM | POA: Diagnosis not present

## 2022-06-11 DIAGNOSIS — H353221 Exudative age-related macular degeneration, left eye, with active choroidal neovascularization: Secondary | ICD-10-CM | POA: Diagnosis not present

## 2022-06-11 DIAGNOSIS — H35373 Puckering of macula, bilateral: Secondary | ICD-10-CM | POA: Diagnosis not present

## 2022-06-11 DIAGNOSIS — Z961 Presence of intraocular lens: Secondary | ICD-10-CM | POA: Diagnosis not present

## 2022-06-11 DIAGNOSIS — H43812 Vitreous degeneration, left eye: Secondary | ICD-10-CM | POA: Diagnosis not present

## 2022-06-11 NOTE — Telephone Encounter (Signed)
Called back to [place] at [employer].  She said she offered just 20 mg of lasix last week and the patient stated that he really did not want to add any more medications right now.  She notes some swelling as in there are pockets of fluid on sides of abd she thinks his dependent areas because he is spending a lot of time in the bed.  No LE swelling.  He did have much more when he first came out of hospital but it resolved.    She will revisit this with him in the future and let us know if the patient would like to try it to see if helps his shortness of breath.  She will let us know if he does.

## 2022-06-12 DIAGNOSIS — R627 Adult failure to thrive: Secondary | ICD-10-CM | POA: Diagnosis not present

## 2022-06-12 DIAGNOSIS — D693 Immune thrombocytopenic purpura: Secondary | ICD-10-CM | POA: Diagnosis not present

## 2022-06-12 DIAGNOSIS — R5381 Other malaise: Secondary | ICD-10-CM | POA: Diagnosis not present

## 2022-06-12 DIAGNOSIS — C61 Malignant neoplasm of prostate: Secondary | ICD-10-CM | POA: Diagnosis not present

## 2022-06-12 DIAGNOSIS — Z856 Personal history of leukemia: Secondary | ICD-10-CM | POA: Diagnosis not present

## 2022-06-12 DIAGNOSIS — C7951 Secondary malignant neoplasm of bone: Secondary | ICD-10-CM | POA: Diagnosis not present

## 2022-06-13 DIAGNOSIS — C7951 Secondary malignant neoplasm of bone: Secondary | ICD-10-CM | POA: Diagnosis not present

## 2022-06-13 DIAGNOSIS — R5381 Other malaise: Secondary | ICD-10-CM | POA: Diagnosis not present

## 2022-06-13 DIAGNOSIS — C61 Malignant neoplasm of prostate: Secondary | ICD-10-CM | POA: Diagnosis not present

## 2022-06-13 DIAGNOSIS — R627 Adult failure to thrive: Secondary | ICD-10-CM | POA: Diagnosis not present

## 2022-06-13 DIAGNOSIS — Z856 Personal history of leukemia: Secondary | ICD-10-CM | POA: Diagnosis not present

## 2022-06-13 DIAGNOSIS — D693 Immune thrombocytopenic purpura: Secondary | ICD-10-CM | POA: Diagnosis not present

## 2022-06-17 DIAGNOSIS — C61 Malignant neoplasm of prostate: Secondary | ICD-10-CM | POA: Diagnosis not present

## 2022-06-17 DIAGNOSIS — R5381 Other malaise: Secondary | ICD-10-CM | POA: Diagnosis not present

## 2022-06-17 DIAGNOSIS — Z856 Personal history of leukemia: Secondary | ICD-10-CM | POA: Diagnosis not present

## 2022-06-17 DIAGNOSIS — C7951 Secondary malignant neoplasm of bone: Secondary | ICD-10-CM | POA: Diagnosis not present

## 2022-06-17 DIAGNOSIS — D693 Immune thrombocytopenic purpura: Secondary | ICD-10-CM | POA: Diagnosis not present

## 2022-06-17 DIAGNOSIS — R627 Adult failure to thrive: Secondary | ICD-10-CM | POA: Diagnosis not present

## 2022-06-18 DIAGNOSIS — C61 Malignant neoplasm of prostate: Secondary | ICD-10-CM | POA: Diagnosis not present

## 2022-06-18 DIAGNOSIS — D693 Immune thrombocytopenic purpura: Secondary | ICD-10-CM | POA: Diagnosis not present

## 2022-06-18 DIAGNOSIS — C7951 Secondary malignant neoplasm of bone: Secondary | ICD-10-CM | POA: Diagnosis not present

## 2022-06-18 DIAGNOSIS — R627 Adult failure to thrive: Secondary | ICD-10-CM | POA: Diagnosis not present

## 2022-06-18 DIAGNOSIS — R5381 Other malaise: Secondary | ICD-10-CM | POA: Diagnosis not present

## 2022-06-18 DIAGNOSIS — Z856 Personal history of leukemia: Secondary | ICD-10-CM | POA: Diagnosis not present

## 2022-06-20 DIAGNOSIS — R5381 Other malaise: Secondary | ICD-10-CM | POA: Diagnosis not present

## 2022-06-20 DIAGNOSIS — C61 Malignant neoplasm of prostate: Secondary | ICD-10-CM | POA: Diagnosis not present

## 2022-06-20 DIAGNOSIS — C7951 Secondary malignant neoplasm of bone: Secondary | ICD-10-CM | POA: Diagnosis not present

## 2022-06-20 DIAGNOSIS — R627 Adult failure to thrive: Secondary | ICD-10-CM | POA: Diagnosis not present

## 2022-06-20 DIAGNOSIS — Z856 Personal history of leukemia: Secondary | ICD-10-CM | POA: Diagnosis not present

## 2022-06-20 DIAGNOSIS — D693 Immune thrombocytopenic purpura: Secondary | ICD-10-CM | POA: Diagnosis not present

## 2022-06-24 DIAGNOSIS — C61 Malignant neoplasm of prostate: Secondary | ICD-10-CM | POA: Diagnosis not present

## 2022-06-24 DIAGNOSIS — R627 Adult failure to thrive: Secondary | ICD-10-CM | POA: Diagnosis not present

## 2022-06-24 DIAGNOSIS — D693 Immune thrombocytopenic purpura: Secondary | ICD-10-CM | POA: Diagnosis not present

## 2022-06-24 DIAGNOSIS — Z856 Personal history of leukemia: Secondary | ICD-10-CM | POA: Diagnosis not present

## 2022-06-24 DIAGNOSIS — C7951 Secondary malignant neoplasm of bone: Secondary | ICD-10-CM | POA: Diagnosis not present

## 2022-06-24 DIAGNOSIS — R5381 Other malaise: Secondary | ICD-10-CM | POA: Diagnosis not present

## 2022-06-25 DIAGNOSIS — R627 Adult failure to thrive: Secondary | ICD-10-CM | POA: Diagnosis not present

## 2022-06-25 DIAGNOSIS — R5381 Other malaise: Secondary | ICD-10-CM | POA: Diagnosis not present

## 2022-06-25 DIAGNOSIS — D693 Immune thrombocytopenic purpura: Secondary | ICD-10-CM | POA: Diagnosis not present

## 2022-06-25 DIAGNOSIS — C7951 Secondary malignant neoplasm of bone: Secondary | ICD-10-CM | POA: Diagnosis not present

## 2022-06-25 DIAGNOSIS — C61 Malignant neoplasm of prostate: Secondary | ICD-10-CM | POA: Diagnosis not present

## 2022-06-25 DIAGNOSIS — Z856 Personal history of leukemia: Secondary | ICD-10-CM | POA: Diagnosis not present

## 2022-06-27 DIAGNOSIS — Z856 Personal history of leukemia: Secondary | ICD-10-CM | POA: Diagnosis not present

## 2022-06-27 DIAGNOSIS — C7951 Secondary malignant neoplasm of bone: Secondary | ICD-10-CM | POA: Diagnosis not present

## 2022-06-27 DIAGNOSIS — D693 Immune thrombocytopenic purpura: Secondary | ICD-10-CM | POA: Diagnosis not present

## 2022-06-27 DIAGNOSIS — R627 Adult failure to thrive: Secondary | ICD-10-CM | POA: Diagnosis not present

## 2022-06-27 DIAGNOSIS — C61 Malignant neoplasm of prostate: Secondary | ICD-10-CM | POA: Diagnosis not present

## 2022-06-27 DIAGNOSIS — R5381 Other malaise: Secondary | ICD-10-CM | POA: Diagnosis not present

## 2022-07-01 DIAGNOSIS — Z856 Personal history of leukemia: Secondary | ICD-10-CM | POA: Diagnosis not present

## 2022-07-01 DIAGNOSIS — R5381 Other malaise: Secondary | ICD-10-CM | POA: Diagnosis not present

## 2022-07-01 DIAGNOSIS — C7951 Secondary malignant neoplasm of bone: Secondary | ICD-10-CM | POA: Diagnosis not present

## 2022-07-01 DIAGNOSIS — D693 Immune thrombocytopenic purpura: Secondary | ICD-10-CM | POA: Diagnosis not present

## 2022-07-01 DIAGNOSIS — R627 Adult failure to thrive: Secondary | ICD-10-CM | POA: Diagnosis not present

## 2022-07-01 DIAGNOSIS — C61 Malignant neoplasm of prostate: Secondary | ICD-10-CM | POA: Diagnosis not present

## 2022-07-02 DIAGNOSIS — E119 Type 2 diabetes mellitus without complications: Secondary | ICD-10-CM | POA: Diagnosis not present

## 2022-07-02 DIAGNOSIS — I10 Essential (primary) hypertension: Secondary | ICD-10-CM | POA: Diagnosis not present

## 2022-07-02 DIAGNOSIS — R627 Adult failure to thrive: Secondary | ICD-10-CM | POA: Diagnosis not present

## 2022-07-02 DIAGNOSIS — I4891 Unspecified atrial fibrillation: Secondary | ICD-10-CM | POA: Diagnosis not present

## 2022-07-02 DIAGNOSIS — Z856 Personal history of leukemia: Secondary | ICD-10-CM | POA: Diagnosis not present

## 2022-07-02 DIAGNOSIS — S32009D Unspecified fracture of unspecified lumbar vertebra, subsequent encounter for fracture with routine healing: Secondary | ICD-10-CM | POA: Diagnosis not present

## 2022-07-02 DIAGNOSIS — R5381 Other malaise: Secondary | ICD-10-CM | POA: Diagnosis not present

## 2022-07-02 DIAGNOSIS — K219 Gastro-esophageal reflux disease without esophagitis: Secondary | ICD-10-CM | POA: Diagnosis not present

## 2022-07-02 DIAGNOSIS — Z85038 Personal history of other malignant neoplasm of large intestine: Secondary | ICD-10-CM | POA: Diagnosis not present

## 2022-07-02 DIAGNOSIS — H409 Unspecified glaucoma: Secondary | ICD-10-CM | POA: Diagnosis not present

## 2022-07-02 DIAGNOSIS — C7951 Secondary malignant neoplasm of bone: Secondary | ICD-10-CM | POA: Diagnosis not present

## 2022-07-02 DIAGNOSIS — Z85828 Personal history of other malignant neoplasm of skin: Secondary | ICD-10-CM | POA: Diagnosis not present

## 2022-07-02 DIAGNOSIS — Z8619 Personal history of other infectious and parasitic diseases: Secondary | ICD-10-CM | POA: Diagnosis not present

## 2022-07-02 DIAGNOSIS — C61 Malignant neoplasm of prostate: Secondary | ICD-10-CM | POA: Diagnosis not present

## 2022-07-02 DIAGNOSIS — H3530 Unspecified macular degeneration: Secondary | ICD-10-CM | POA: Diagnosis not present

## 2022-07-02 DIAGNOSIS — D693 Immune thrombocytopenic purpura: Secondary | ICD-10-CM | POA: Diagnosis not present

## 2022-07-02 DIAGNOSIS — I2510 Atherosclerotic heart disease of native coronary artery without angina pectoris: Secondary | ICD-10-CM | POA: Diagnosis not present

## 2022-07-03 ENCOUNTER — Telehealth: Payer: Self-pay | Admitting: *Deleted

## 2022-07-03 NOTE — Telephone Encounter (Signed)
Called wife to f/u on Mr. Flessner. Spends most of the day in bed. Still uses the [facility]. Gets a lot of visitors. Pain controlled and he is eating. Think he is doing as good as can be expected. Appreciated the call to check on him. He told her he misses all the staff here.

## 2022-07-04 DIAGNOSIS — D693 Immune thrombocytopenic purpura: Secondary | ICD-10-CM | POA: Diagnosis not present

## 2022-07-04 DIAGNOSIS — R5381 Other malaise: Secondary | ICD-10-CM | POA: Diagnosis not present

## 2022-07-04 DIAGNOSIS — R627 Adult failure to thrive: Secondary | ICD-10-CM | POA: Diagnosis not present

## 2022-07-04 DIAGNOSIS — C61 Malignant neoplasm of prostate: Secondary | ICD-10-CM | POA: Diagnosis not present

## 2022-07-04 DIAGNOSIS — C7951 Secondary malignant neoplasm of bone: Secondary | ICD-10-CM | POA: Diagnosis not present

## 2022-07-04 DIAGNOSIS — Z856 Personal history of leukemia: Secondary | ICD-10-CM | POA: Diagnosis not present

## 2022-07-05 DIAGNOSIS — R5381 Other malaise: Secondary | ICD-10-CM | POA: Diagnosis not present

## 2022-07-05 DIAGNOSIS — Z856 Personal history of leukemia: Secondary | ICD-10-CM | POA: Diagnosis not present

## 2022-07-05 DIAGNOSIS — R627 Adult failure to thrive: Secondary | ICD-10-CM | POA: Diagnosis not present

## 2022-07-05 DIAGNOSIS — C7951 Secondary malignant neoplasm of bone: Secondary | ICD-10-CM | POA: Diagnosis not present

## 2022-07-05 DIAGNOSIS — D693 Immune thrombocytopenic purpura: Secondary | ICD-10-CM | POA: Diagnosis not present

## 2022-07-05 DIAGNOSIS — C61 Malignant neoplasm of prostate: Secondary | ICD-10-CM | POA: Diagnosis not present

## 2022-07-08 DIAGNOSIS — C61 Malignant neoplasm of prostate: Secondary | ICD-10-CM | POA: Diagnosis not present

## 2022-07-08 DIAGNOSIS — R5381 Other malaise: Secondary | ICD-10-CM | POA: Diagnosis not present

## 2022-07-08 DIAGNOSIS — R627 Adult failure to thrive: Secondary | ICD-10-CM | POA: Diagnosis not present

## 2022-07-08 DIAGNOSIS — D693 Immune thrombocytopenic purpura: Secondary | ICD-10-CM | POA: Diagnosis not present

## 2022-07-08 DIAGNOSIS — C7951 Secondary malignant neoplasm of bone: Secondary | ICD-10-CM | POA: Diagnosis not present

## 2022-07-08 DIAGNOSIS — Z856 Personal history of leukemia: Secondary | ICD-10-CM | POA: Diagnosis not present

## 2022-07-09 DIAGNOSIS — R627 Adult failure to thrive: Secondary | ICD-10-CM | POA: Diagnosis not present

## 2022-07-09 DIAGNOSIS — D693 Immune thrombocytopenic purpura: Secondary | ICD-10-CM | POA: Diagnosis not present

## 2022-07-09 DIAGNOSIS — C7951 Secondary malignant neoplasm of bone: Secondary | ICD-10-CM | POA: Diagnosis not present

## 2022-07-09 DIAGNOSIS — R5381 Other malaise: Secondary | ICD-10-CM | POA: Diagnosis not present

## 2022-07-09 DIAGNOSIS — Z856 Personal history of leukemia: Secondary | ICD-10-CM | POA: Diagnosis not present

## 2022-07-09 DIAGNOSIS — C61 Malignant neoplasm of prostate: Secondary | ICD-10-CM | POA: Diagnosis not present

## 2022-07-11 DIAGNOSIS — Z856 Personal history of leukemia: Secondary | ICD-10-CM | POA: Diagnosis not present

## 2022-07-11 DIAGNOSIS — C61 Malignant neoplasm of prostate: Secondary | ICD-10-CM | POA: Diagnosis not present

## 2022-07-11 DIAGNOSIS — D693 Immune thrombocytopenic purpura: Secondary | ICD-10-CM | POA: Diagnosis not present

## 2022-07-11 DIAGNOSIS — R5381 Other malaise: Secondary | ICD-10-CM | POA: Diagnosis not present

## 2022-07-11 DIAGNOSIS — R627 Adult failure to thrive: Secondary | ICD-10-CM | POA: Diagnosis not present

## 2022-07-11 DIAGNOSIS — C7951 Secondary malignant neoplasm of bone: Secondary | ICD-10-CM | POA: Diagnosis not present

## 2022-07-15 DIAGNOSIS — C7951 Secondary malignant neoplasm of bone: Secondary | ICD-10-CM | POA: Diagnosis not present

## 2022-07-15 DIAGNOSIS — R5381 Other malaise: Secondary | ICD-10-CM | POA: Diagnosis not present

## 2022-07-15 DIAGNOSIS — Z856 Personal history of leukemia: Secondary | ICD-10-CM | POA: Diagnosis not present

## 2022-07-15 DIAGNOSIS — D693 Immune thrombocytopenic purpura: Secondary | ICD-10-CM | POA: Diagnosis not present

## 2022-07-15 DIAGNOSIS — R627 Adult failure to thrive: Secondary | ICD-10-CM | POA: Diagnosis not present

## 2022-07-15 DIAGNOSIS — C61 Malignant neoplasm of prostate: Secondary | ICD-10-CM | POA: Diagnosis not present

## 2022-07-16 DIAGNOSIS — R627 Adult failure to thrive: Secondary | ICD-10-CM | POA: Diagnosis not present

## 2022-07-16 DIAGNOSIS — C61 Malignant neoplasm of prostate: Secondary | ICD-10-CM | POA: Diagnosis not present

## 2022-07-16 DIAGNOSIS — C7951 Secondary malignant neoplasm of bone: Secondary | ICD-10-CM | POA: Diagnosis not present

## 2022-07-16 DIAGNOSIS — D693 Immune thrombocytopenic purpura: Secondary | ICD-10-CM | POA: Diagnosis not present

## 2022-07-16 DIAGNOSIS — R5381 Other malaise: Secondary | ICD-10-CM | POA: Diagnosis not present

## 2022-07-16 DIAGNOSIS — Z856 Personal history of leukemia: Secondary | ICD-10-CM | POA: Diagnosis not present

## 2022-07-17 DIAGNOSIS — R5381 Other malaise: Secondary | ICD-10-CM | POA: Diagnosis not present

## 2022-07-17 DIAGNOSIS — C61 Malignant neoplasm of prostate: Secondary | ICD-10-CM | POA: Diagnosis not present

## 2022-07-17 DIAGNOSIS — C7951 Secondary malignant neoplasm of bone: Secondary | ICD-10-CM | POA: Diagnosis not present

## 2022-07-17 DIAGNOSIS — D693 Immune thrombocytopenic purpura: Secondary | ICD-10-CM | POA: Diagnosis not present

## 2022-07-17 DIAGNOSIS — Z856 Personal history of leukemia: Secondary | ICD-10-CM | POA: Diagnosis not present

## 2022-07-17 DIAGNOSIS — R627 Adult failure to thrive: Secondary | ICD-10-CM | POA: Diagnosis not present

## 2022-07-18 DIAGNOSIS — R5381 Other malaise: Secondary | ICD-10-CM | POA: Diagnosis not present

## 2022-07-18 DIAGNOSIS — Z856 Personal history of leukemia: Secondary | ICD-10-CM | POA: Diagnosis not present

## 2022-07-18 DIAGNOSIS — D693 Immune thrombocytopenic purpura: Secondary | ICD-10-CM | POA: Diagnosis not present

## 2022-07-18 DIAGNOSIS — C61 Malignant neoplasm of prostate: Secondary | ICD-10-CM | POA: Diagnosis not present

## 2022-07-18 DIAGNOSIS — C7951 Secondary malignant neoplasm of bone: Secondary | ICD-10-CM | POA: Diagnosis not present

## 2022-07-18 DIAGNOSIS — R627 Adult failure to thrive: Secondary | ICD-10-CM | POA: Diagnosis not present

## 2022-07-22 DIAGNOSIS — C61 Malignant neoplasm of prostate: Secondary | ICD-10-CM | POA: Diagnosis not present

## 2022-07-22 DIAGNOSIS — Z856 Personal history of leukemia: Secondary | ICD-10-CM | POA: Diagnosis not present

## 2022-07-22 DIAGNOSIS — R627 Adult failure to thrive: Secondary | ICD-10-CM | POA: Diagnosis not present

## 2022-07-22 DIAGNOSIS — R5381 Other malaise: Secondary | ICD-10-CM | POA: Diagnosis not present

## 2022-07-22 DIAGNOSIS — D693 Immune thrombocytopenic purpura: Secondary | ICD-10-CM | POA: Diagnosis not present

## 2022-07-22 DIAGNOSIS — C7951 Secondary malignant neoplasm of bone: Secondary | ICD-10-CM | POA: Diagnosis not present

## 2022-07-23 DIAGNOSIS — C7951 Secondary malignant neoplasm of bone: Secondary | ICD-10-CM | POA: Diagnosis not present

## 2022-07-23 DIAGNOSIS — R5381 Other malaise: Secondary | ICD-10-CM | POA: Diagnosis not present

## 2022-07-23 DIAGNOSIS — C61 Malignant neoplasm of prostate: Secondary | ICD-10-CM | POA: Diagnosis not present

## 2022-07-23 DIAGNOSIS — D693 Immune thrombocytopenic purpura: Secondary | ICD-10-CM | POA: Diagnosis not present

## 2022-07-23 DIAGNOSIS — Z856 Personal history of leukemia: Secondary | ICD-10-CM | POA: Diagnosis not present

## 2022-07-23 DIAGNOSIS — R627 Adult failure to thrive: Secondary | ICD-10-CM | POA: Diagnosis not present

## 2022-07-24 DIAGNOSIS — R627 Adult failure to thrive: Secondary | ICD-10-CM | POA: Diagnosis not present

## 2022-07-24 DIAGNOSIS — R5381 Other malaise: Secondary | ICD-10-CM | POA: Diagnosis not present

## 2022-07-24 DIAGNOSIS — C7951 Secondary malignant neoplasm of bone: Secondary | ICD-10-CM | POA: Diagnosis not present

## 2022-07-24 DIAGNOSIS — C61 Malignant neoplasm of prostate: Secondary | ICD-10-CM | POA: Diagnosis not present

## 2022-07-24 DIAGNOSIS — D693 Immune thrombocytopenic purpura: Secondary | ICD-10-CM | POA: Diagnosis not present

## 2022-07-24 DIAGNOSIS — Z856 Personal history of leukemia: Secondary | ICD-10-CM | POA: Diagnosis not present

## 2022-07-25 DIAGNOSIS — C7951 Secondary malignant neoplasm of bone: Secondary | ICD-10-CM | POA: Diagnosis not present

## 2022-07-25 DIAGNOSIS — Z856 Personal history of leukemia: Secondary | ICD-10-CM | POA: Diagnosis not present

## 2022-07-25 DIAGNOSIS — C61 Malignant neoplasm of prostate: Secondary | ICD-10-CM | POA: Diagnosis not present

## 2022-07-25 DIAGNOSIS — R5381 Other malaise: Secondary | ICD-10-CM | POA: Diagnosis not present

## 2022-07-25 DIAGNOSIS — R627 Adult failure to thrive: Secondary | ICD-10-CM | POA: Diagnosis not present

## 2022-07-25 DIAGNOSIS — D693 Immune thrombocytopenic purpura: Secondary | ICD-10-CM | POA: Diagnosis not present

## 2022-07-29 DIAGNOSIS — R627 Adult failure to thrive: Secondary | ICD-10-CM | POA: Diagnosis not present

## 2022-07-29 DIAGNOSIS — D693 Immune thrombocytopenic purpura: Secondary | ICD-10-CM | POA: Diagnosis not present

## 2022-07-29 DIAGNOSIS — R5381 Other malaise: Secondary | ICD-10-CM | POA: Diagnosis not present

## 2022-07-29 DIAGNOSIS — Z856 Personal history of leukemia: Secondary | ICD-10-CM | POA: Diagnosis not present

## 2022-07-29 DIAGNOSIS — C7951 Secondary malignant neoplasm of bone: Secondary | ICD-10-CM | POA: Diagnosis not present

## 2022-07-29 DIAGNOSIS — C61 Malignant neoplasm of prostate: Secondary | ICD-10-CM | POA: Diagnosis not present

## 2022-08-01 DIAGNOSIS — R627 Adult failure to thrive: Secondary | ICD-10-CM | POA: Diagnosis not present

## 2022-08-01 DIAGNOSIS — D693 Immune thrombocytopenic purpura: Secondary | ICD-10-CM | POA: Diagnosis not present

## 2022-08-01 DIAGNOSIS — R5381 Other malaise: Secondary | ICD-10-CM | POA: Diagnosis not present

## 2022-08-01 DIAGNOSIS — C7951 Secondary malignant neoplasm of bone: Secondary | ICD-10-CM | POA: Diagnosis not present

## 2022-08-01 DIAGNOSIS — Z856 Personal history of leukemia: Secondary | ICD-10-CM | POA: Diagnosis not present

## 2022-08-01 DIAGNOSIS — C61 Malignant neoplasm of prostate: Secondary | ICD-10-CM | POA: Diagnosis not present

## 2022-08-02 DIAGNOSIS — K219 Gastro-esophageal reflux disease without esophagitis: Secondary | ICD-10-CM | POA: Diagnosis not present

## 2022-08-02 DIAGNOSIS — R627 Adult failure to thrive: Secondary | ICD-10-CM | POA: Diagnosis not present

## 2022-08-02 DIAGNOSIS — Z856 Personal history of leukemia: Secondary | ICD-10-CM | POA: Diagnosis not present

## 2022-08-02 DIAGNOSIS — S32009D Unspecified fracture of unspecified lumbar vertebra, subsequent encounter for fracture with routine healing: Secondary | ICD-10-CM | POA: Diagnosis not present

## 2022-08-02 DIAGNOSIS — H409 Unspecified glaucoma: Secondary | ICD-10-CM | POA: Diagnosis not present

## 2022-08-02 DIAGNOSIS — C61 Malignant neoplasm of prostate: Secondary | ICD-10-CM | POA: Diagnosis not present

## 2022-08-02 DIAGNOSIS — I10 Essential (primary) hypertension: Secondary | ICD-10-CM | POA: Diagnosis not present

## 2022-08-02 DIAGNOSIS — Z85038 Personal history of other malignant neoplasm of large intestine: Secondary | ICD-10-CM | POA: Diagnosis not present

## 2022-08-02 DIAGNOSIS — D693 Immune thrombocytopenic purpura: Secondary | ICD-10-CM | POA: Diagnosis not present

## 2022-08-02 DIAGNOSIS — I4891 Unspecified atrial fibrillation: Secondary | ICD-10-CM | POA: Diagnosis not present

## 2022-08-02 DIAGNOSIS — I2510 Atherosclerotic heart disease of native coronary artery without angina pectoris: Secondary | ICD-10-CM | POA: Diagnosis not present

## 2022-08-02 DIAGNOSIS — C7951 Secondary malignant neoplasm of bone: Secondary | ICD-10-CM | POA: Diagnosis not present

## 2022-08-02 DIAGNOSIS — Z85828 Personal history of other malignant neoplasm of skin: Secondary | ICD-10-CM | POA: Diagnosis not present

## 2022-08-02 DIAGNOSIS — H3530 Unspecified macular degeneration: Secondary | ICD-10-CM | POA: Diagnosis not present

## 2022-08-02 DIAGNOSIS — E119 Type 2 diabetes mellitus without complications: Secondary | ICD-10-CM | POA: Diagnosis not present

## 2022-08-02 DIAGNOSIS — R5381 Other malaise: Secondary | ICD-10-CM | POA: Diagnosis not present

## 2022-08-02 DIAGNOSIS — Z8619 Personal history of other infectious and parasitic diseases: Secondary | ICD-10-CM | POA: Diagnosis not present

## 2022-08-03 DIAGNOSIS — C61 Malignant neoplasm of prostate: Secondary | ICD-10-CM | POA: Diagnosis not present

## 2022-08-03 DIAGNOSIS — R5381 Other malaise: Secondary | ICD-10-CM | POA: Diagnosis not present

## 2022-08-03 DIAGNOSIS — C7951 Secondary malignant neoplasm of bone: Secondary | ICD-10-CM | POA: Diagnosis not present

## 2022-08-03 DIAGNOSIS — Z856 Personal history of leukemia: Secondary | ICD-10-CM | POA: Diagnosis not present

## 2022-08-03 DIAGNOSIS — D693 Immune thrombocytopenic purpura: Secondary | ICD-10-CM | POA: Diagnosis not present

## 2022-08-03 DIAGNOSIS — R627 Adult failure to thrive: Secondary | ICD-10-CM | POA: Diagnosis not present

## 2022-08-04 DIAGNOSIS — Z856 Personal history of leukemia: Secondary | ICD-10-CM | POA: Diagnosis not present

## 2022-08-04 DIAGNOSIS — R627 Adult failure to thrive: Secondary | ICD-10-CM | POA: Diagnosis not present

## 2022-08-04 DIAGNOSIS — R5381 Other malaise: Secondary | ICD-10-CM | POA: Diagnosis not present

## 2022-08-04 DIAGNOSIS — C7951 Secondary malignant neoplasm of bone: Secondary | ICD-10-CM | POA: Diagnosis not present

## 2022-08-04 DIAGNOSIS — D693 Immune thrombocytopenic purpura: Secondary | ICD-10-CM | POA: Diagnosis not present

## 2022-08-04 DIAGNOSIS — C61 Malignant neoplasm of prostate: Secondary | ICD-10-CM | POA: Diagnosis not present

## 2022-08-05 DIAGNOSIS — D693 Immune thrombocytopenic purpura: Secondary | ICD-10-CM | POA: Diagnosis not present

## 2022-08-05 DIAGNOSIS — Z856 Personal history of leukemia: Secondary | ICD-10-CM | POA: Diagnosis not present

## 2022-08-05 DIAGNOSIS — C61 Malignant neoplasm of prostate: Secondary | ICD-10-CM | POA: Diagnosis not present

## 2022-08-05 DIAGNOSIS — R5381 Other malaise: Secondary | ICD-10-CM | POA: Diagnosis not present

## 2022-08-05 DIAGNOSIS — R627 Adult failure to thrive: Secondary | ICD-10-CM | POA: Diagnosis not present

## 2022-08-05 DIAGNOSIS — C7951 Secondary malignant neoplasm of bone: Secondary | ICD-10-CM | POA: Diagnosis not present

## 2022-08-07 DIAGNOSIS — Z856 Personal history of leukemia: Secondary | ICD-10-CM | POA: Diagnosis not present

## 2022-08-07 DIAGNOSIS — C61 Malignant neoplasm of prostate: Secondary | ICD-10-CM | POA: Diagnosis not present

## 2022-08-07 DIAGNOSIS — D693 Immune thrombocytopenic purpura: Secondary | ICD-10-CM | POA: Diagnosis not present

## 2022-08-07 DIAGNOSIS — C7951 Secondary malignant neoplasm of bone: Secondary | ICD-10-CM | POA: Diagnosis not present

## 2022-08-07 DIAGNOSIS — R627 Adult failure to thrive: Secondary | ICD-10-CM | POA: Diagnosis not present

## 2022-08-07 DIAGNOSIS — R5381 Other malaise: Secondary | ICD-10-CM | POA: Diagnosis not present

## 2022-08-08 DIAGNOSIS — C61 Malignant neoplasm of prostate: Secondary | ICD-10-CM | POA: Diagnosis not present

## 2022-08-08 DIAGNOSIS — Z856 Personal history of leukemia: Secondary | ICD-10-CM | POA: Diagnosis not present

## 2022-08-08 DIAGNOSIS — R627 Adult failure to thrive: Secondary | ICD-10-CM | POA: Diagnosis not present

## 2022-08-08 DIAGNOSIS — R5381 Other malaise: Secondary | ICD-10-CM | POA: Diagnosis not present

## 2022-08-08 DIAGNOSIS — D693 Immune thrombocytopenic purpura: Secondary | ICD-10-CM | POA: Diagnosis not present

## 2022-08-08 DIAGNOSIS — C7951 Secondary malignant neoplasm of bone: Secondary | ICD-10-CM | POA: Diagnosis not present

## 2022-08-11 DIAGNOSIS — Z856 Personal history of leukemia: Secondary | ICD-10-CM | POA: Diagnosis not present

## 2022-08-11 DIAGNOSIS — D693 Immune thrombocytopenic purpura: Secondary | ICD-10-CM | POA: Diagnosis not present

## 2022-08-11 DIAGNOSIS — C61 Malignant neoplasm of prostate: Secondary | ICD-10-CM | POA: Diagnosis not present

## 2022-08-11 DIAGNOSIS — C7951 Secondary malignant neoplasm of bone: Secondary | ICD-10-CM | POA: Diagnosis not present

## 2022-08-11 DIAGNOSIS — R5381 Other malaise: Secondary | ICD-10-CM | POA: Diagnosis not present

## 2022-08-11 DIAGNOSIS — R627 Adult failure to thrive: Secondary | ICD-10-CM | POA: Diagnosis not present

## 2022-08-12 DIAGNOSIS — D693 Immune thrombocytopenic purpura: Secondary | ICD-10-CM | POA: Diagnosis not present

## 2022-08-12 DIAGNOSIS — R5381 Other malaise: Secondary | ICD-10-CM | POA: Diagnosis not present

## 2022-08-12 DIAGNOSIS — C61 Malignant neoplasm of prostate: Secondary | ICD-10-CM | POA: Diagnosis not present

## 2022-08-12 DIAGNOSIS — Z856 Personal history of leukemia: Secondary | ICD-10-CM | POA: Diagnosis not present

## 2022-08-12 DIAGNOSIS — C7951 Secondary malignant neoplasm of bone: Secondary | ICD-10-CM | POA: Diagnosis not present

## 2022-08-12 DIAGNOSIS — R627 Adult failure to thrive: Secondary | ICD-10-CM | POA: Diagnosis not present

## 2022-08-14 DIAGNOSIS — C61 Malignant neoplasm of prostate: Secondary | ICD-10-CM | POA: Diagnosis not present

## 2022-08-14 DIAGNOSIS — C7951 Secondary malignant neoplasm of bone: Secondary | ICD-10-CM | POA: Diagnosis not present

## 2022-08-14 DIAGNOSIS — R627 Adult failure to thrive: Secondary | ICD-10-CM | POA: Diagnosis not present

## 2022-08-14 DIAGNOSIS — R5381 Other malaise: Secondary | ICD-10-CM | POA: Diagnosis not present

## 2022-08-14 DIAGNOSIS — D693 Immune thrombocytopenic purpura: Secondary | ICD-10-CM | POA: Diagnosis not present

## 2022-08-14 DIAGNOSIS — Z856 Personal history of leukemia: Secondary | ICD-10-CM | POA: Diagnosis not present

## 2022-08-15 DIAGNOSIS — R627 Adult failure to thrive: Secondary | ICD-10-CM | POA: Diagnosis not present

## 2022-08-15 DIAGNOSIS — C61 Malignant neoplasm of prostate: Secondary | ICD-10-CM | POA: Diagnosis not present

## 2022-08-15 DIAGNOSIS — C7951 Secondary malignant neoplasm of bone: Secondary | ICD-10-CM | POA: Diagnosis not present

## 2022-08-15 DIAGNOSIS — R5381 Other malaise: Secondary | ICD-10-CM | POA: Diagnosis not present

## 2022-08-15 DIAGNOSIS — Z856 Personal history of leukemia: Secondary | ICD-10-CM | POA: Diagnosis not present

## 2022-08-15 DIAGNOSIS — D693 Immune thrombocytopenic purpura: Secondary | ICD-10-CM | POA: Diagnosis not present

## 2022-08-16 DIAGNOSIS — C61 Malignant neoplasm of prostate: Secondary | ICD-10-CM | POA: Diagnosis not present

## 2022-08-16 DIAGNOSIS — Z856 Personal history of leukemia: Secondary | ICD-10-CM | POA: Diagnosis not present

## 2022-08-16 DIAGNOSIS — R5381 Other malaise: Secondary | ICD-10-CM | POA: Diagnosis not present

## 2022-08-16 DIAGNOSIS — C7951 Secondary malignant neoplasm of bone: Secondary | ICD-10-CM | POA: Diagnosis not present

## 2022-08-16 DIAGNOSIS — D693 Immune thrombocytopenic purpura: Secondary | ICD-10-CM | POA: Diagnosis not present

## 2022-08-16 DIAGNOSIS — R627 Adult failure to thrive: Secondary | ICD-10-CM | POA: Diagnosis not present

## 2022-08-17 DIAGNOSIS — C7951 Secondary malignant neoplasm of bone: Secondary | ICD-10-CM | POA: Diagnosis not present

## 2022-08-17 DIAGNOSIS — D693 Immune thrombocytopenic purpura: Secondary | ICD-10-CM | POA: Diagnosis not present

## 2022-08-17 DIAGNOSIS — R627 Adult failure to thrive: Secondary | ICD-10-CM | POA: Diagnosis not present

## 2022-08-17 DIAGNOSIS — R5381 Other malaise: Secondary | ICD-10-CM | POA: Diagnosis not present

## 2022-08-17 DIAGNOSIS — C61 Malignant neoplasm of prostate: Secondary | ICD-10-CM | POA: Diagnosis not present

## 2022-08-17 DIAGNOSIS — Z856 Personal history of leukemia: Secondary | ICD-10-CM | POA: Diagnosis not present

## 2022-08-18 DIAGNOSIS — R627 Adult failure to thrive: Secondary | ICD-10-CM | POA: Diagnosis not present

## 2022-08-18 DIAGNOSIS — R5381 Other malaise: Secondary | ICD-10-CM | POA: Diagnosis not present

## 2022-08-18 DIAGNOSIS — C61 Malignant neoplasm of prostate: Secondary | ICD-10-CM | POA: Diagnosis not present

## 2022-08-18 DIAGNOSIS — D693 Immune thrombocytopenic purpura: Secondary | ICD-10-CM | POA: Diagnosis not present

## 2022-08-18 DIAGNOSIS — C7951 Secondary malignant neoplasm of bone: Secondary | ICD-10-CM | POA: Diagnosis not present

## 2022-08-18 DIAGNOSIS — Z856 Personal history of leukemia: Secondary | ICD-10-CM | POA: Diagnosis not present

## 2022-08-19 DIAGNOSIS — D693 Immune thrombocytopenic purpura: Secondary | ICD-10-CM | POA: Diagnosis not present

## 2022-08-19 DIAGNOSIS — Z856 Personal history of leukemia: Secondary | ICD-10-CM | POA: Diagnosis not present

## 2022-08-19 DIAGNOSIS — R627 Adult failure to thrive: Secondary | ICD-10-CM | POA: Diagnosis not present

## 2022-08-19 DIAGNOSIS — C7951 Secondary malignant neoplasm of bone: Secondary | ICD-10-CM | POA: Diagnosis not present

## 2022-08-19 DIAGNOSIS — C61 Malignant neoplasm of prostate: Secondary | ICD-10-CM | POA: Diagnosis not present

## 2022-08-19 DIAGNOSIS — R5381 Other malaise: Secondary | ICD-10-CM | POA: Diagnosis not present

## 2022-08-20 DIAGNOSIS — Z856 Personal history of leukemia: Secondary | ICD-10-CM | POA: Diagnosis not present

## 2022-08-20 DIAGNOSIS — D693 Immune thrombocytopenic purpura: Secondary | ICD-10-CM | POA: Diagnosis not present

## 2022-08-20 DIAGNOSIS — C61 Malignant neoplasm of prostate: Secondary | ICD-10-CM | POA: Diagnosis not present

## 2022-08-20 DIAGNOSIS — R627 Adult failure to thrive: Secondary | ICD-10-CM | POA: Diagnosis not present

## 2022-08-20 DIAGNOSIS — R5381 Other malaise: Secondary | ICD-10-CM | POA: Diagnosis not present

## 2022-08-20 DIAGNOSIS — C7951 Secondary malignant neoplasm of bone: Secondary | ICD-10-CM | POA: Diagnosis not present

## 2022-08-21 DIAGNOSIS — Z856 Personal history of leukemia: Secondary | ICD-10-CM | POA: Diagnosis not present

## 2022-08-21 DIAGNOSIS — C7951 Secondary malignant neoplasm of bone: Secondary | ICD-10-CM | POA: Diagnosis not present

## 2022-08-21 DIAGNOSIS — C61 Malignant neoplasm of prostate: Secondary | ICD-10-CM | POA: Diagnosis not present

## 2022-08-21 DIAGNOSIS — R627 Adult failure to thrive: Secondary | ICD-10-CM | POA: Diagnosis not present

## 2022-08-21 DIAGNOSIS — R5381 Other malaise: Secondary | ICD-10-CM | POA: Diagnosis not present

## 2022-08-21 DIAGNOSIS — D693 Immune thrombocytopenic purpura: Secondary | ICD-10-CM | POA: Diagnosis not present

## 2022-08-22 ENCOUNTER — Telehealth: Payer: Self-pay

## 2022-08-22 DIAGNOSIS — R627 Adult failure to thrive: Secondary | ICD-10-CM | POA: Diagnosis not present

## 2022-08-22 DIAGNOSIS — C7951 Secondary malignant neoplasm of bone: Secondary | ICD-10-CM | POA: Diagnosis not present

## 2022-08-22 DIAGNOSIS — C61 Malignant neoplasm of prostate: Secondary | ICD-10-CM | POA: Diagnosis not present

## 2022-08-22 DIAGNOSIS — R5381 Other malaise: Secondary | ICD-10-CM | POA: Diagnosis not present

## 2022-08-22 DIAGNOSIS — D693 Immune thrombocytopenic purpura: Secondary | ICD-10-CM | POA: Diagnosis not present

## 2022-08-22 DIAGNOSIS — Z856 Personal history of leukemia: Secondary | ICD-10-CM | POA: Diagnosis not present

## 2022-08-22 NOTE — Telephone Encounter (Signed)
Sabrina from [facility] provided an update that the patient passed away at 1:31 pm on Sep 04, 2022.

## 2022-09-01 DEATH — deceased

## 2022-11-21 NOTE — Radiation Completion Notes (Signed)
Patient Name: [name], [name] MRN: [id number] Date of Birth: September 29, 1923 Referring Physician: Thornton Papas, M.D. Date of Service: 2022-11-21 Radiation Oncologist: Margaretmary Bayley, M.D. [facility]                             RADIATION ONCOLOGY END OF TREATMENT NOTE     Diagnosis: C79.51 Secondary malignant neoplasm of bone Staging on 2021-07-30: Prostate cancer metastatic to bone (HCC) T=cTX, N=cNX, M=cM1b Intent: Palliative     ==========DELIVERED PLANS==========  First Treatment Date: 2022-04-03 - Last Treatment Date: 2022-04-16   Plan Name: Spine_L Site: Lumbar Spine Technique: 3D Mode: Photon Dose Per Fraction: 3 Gy Prescribed Dose (Delivered / Prescribed): 30 Gy / 30 Gy Prescribed Fxs (Delivered / Prescribed): 10 / 10     ==========ON TREATMENT VISIT DATES========== 2022-04-04, 2022-04-10     ==========UPCOMING VISITS==========       ==========APPENDIX - ON TREATMENT VISIT NOTES==========   See weekly On Treatment Notes in Epic for details.

## 2023-03-31 NOTE — Addendum Note (Signed)
Encounter addended by: Silvana Newness on: 03/31/2023 4:22 PM  Actions taken: Imaging Exam ended
